# Patient Record
Sex: Female | Born: 1945
Health system: Southern US, Community
[De-identification: ages and names within clinical notes are randomized; demographics above are authoritative.]

## PROBLEM LIST (undated history)

## (undated) DIAGNOSIS — I10 Essential (primary) hypertension: Secondary | ICD-10-CM

## (undated) DIAGNOSIS — Z87442 Personal history of urinary calculi: Secondary | ICD-10-CM

## (undated) DIAGNOSIS — E785 Hyperlipidemia, unspecified: Secondary | ICD-10-CM

## (undated) DIAGNOSIS — K5790 Diverticulosis of intestine, part unspecified, without perforation or abscess without bleeding: Secondary | ICD-10-CM

## (undated) DIAGNOSIS — M1990 Unspecified osteoarthritis, unspecified site: Secondary | ICD-10-CM

## (undated) DIAGNOSIS — R51 Headache: Secondary | ICD-10-CM

## (undated) DIAGNOSIS — I499 Cardiac arrhythmia, unspecified: Secondary | ICD-10-CM

## (undated) DIAGNOSIS — G8929 Other chronic pain: Secondary | ICD-10-CM

## (undated) DIAGNOSIS — K219 Gastro-esophageal reflux disease without esophagitis: Secondary | ICD-10-CM

## (undated) DIAGNOSIS — R42 Dizziness and giddiness: Secondary | ICD-10-CM

## (undated) DIAGNOSIS — M5410 Radiculopathy, site unspecified: Secondary | ICD-10-CM

## (undated) DIAGNOSIS — M549 Dorsalgia, unspecified: Secondary | ICD-10-CM

## (undated) DIAGNOSIS — R519 Headache, unspecified: Secondary | ICD-10-CM

## (undated) DIAGNOSIS — G629 Polyneuropathy, unspecified: Secondary | ICD-10-CM

## (undated) DIAGNOSIS — R0989 Other specified symptoms and signs involving the circulatory and respiratory systems: Secondary | ICD-10-CM

## (undated) DIAGNOSIS — G43909 Migraine, unspecified, not intractable, without status migrainosus: Secondary | ICD-10-CM

## (undated) DIAGNOSIS — E119 Type 2 diabetes mellitus without complications: Secondary | ICD-10-CM

## (undated) HISTORY — PX: TUBAL LIGATION: SHX77

## (undated) HISTORY — PX: VAGINAL HYSTERECTOMY: SUR661

## (undated) HISTORY — PX: LASIK: SHX215

---

## 1997-11-16 ENCOUNTER — Other Ambulatory Visit: Admission: RE | Admit: 1997-11-16 | Discharge: 1997-11-16 | Payer: Self-pay | Admitting: Obstetrics & Gynecology

## 1997-11-20 ENCOUNTER — Ambulatory Visit (HOSPITAL_COMMUNITY): Admission: RE | Admit: 1997-11-20 | Discharge: 1997-11-20 | Payer: Self-pay | Admitting: Obstetrics and Gynecology

## 1997-11-22 ENCOUNTER — Ambulatory Visit (HOSPITAL_COMMUNITY): Admission: RE | Admit: 1997-11-22 | Discharge: 1997-11-22 | Payer: Self-pay | Admitting: Obstetrics & Gynecology

## 1998-02-20 ENCOUNTER — Other Ambulatory Visit: Admission: RE | Admit: 1998-02-20 | Discharge: 1998-02-20 | Payer: Self-pay | Admitting: Obstetrics & Gynecology

## 1998-02-25 ENCOUNTER — Encounter: Payer: Self-pay | Admitting: Neurosurgery

## 1998-02-25 ENCOUNTER — Ambulatory Visit (HOSPITAL_COMMUNITY): Admission: RE | Admit: 1998-02-25 | Discharge: 1998-02-25 | Payer: Self-pay | Admitting: Neurosurgery

## 1998-05-18 HISTORY — PX: ANTERIOR FUSION CERVICAL SPINE: SUR626

## 1998-05-19 ENCOUNTER — Emergency Department (HOSPITAL_COMMUNITY): Admission: EM | Admit: 1998-05-19 | Discharge: 1998-05-19 | Payer: Self-pay | Admitting: Emergency Medicine

## 1998-05-19 ENCOUNTER — Encounter: Payer: Self-pay | Admitting: Emergency Medicine

## 1998-05-22 ENCOUNTER — Emergency Department (HOSPITAL_COMMUNITY): Admission: EM | Admit: 1998-05-22 | Discharge: 1998-05-22 | Payer: Self-pay | Admitting: *Deleted

## 1998-07-17 ENCOUNTER — Ambulatory Visit (HOSPITAL_COMMUNITY): Admission: RE | Admit: 1998-07-17 | Discharge: 1998-07-17 | Payer: Self-pay | Admitting: Obstetrics & Gynecology

## 1998-07-17 ENCOUNTER — Encounter: Payer: Self-pay | Admitting: Obstetrics & Gynecology

## 1998-09-06 ENCOUNTER — Emergency Department (HOSPITAL_COMMUNITY): Admission: EM | Admit: 1998-09-06 | Discharge: 1998-09-06 | Payer: Self-pay | Admitting: Emergency Medicine

## 1998-09-14 ENCOUNTER — Emergency Department (HOSPITAL_COMMUNITY): Admission: EM | Admit: 1998-09-14 | Discharge: 1998-09-14 | Payer: Self-pay | Admitting: Emergency Medicine

## 1998-09-23 ENCOUNTER — Encounter: Payer: Self-pay | Admitting: Cardiology

## 1998-09-23 ENCOUNTER — Ambulatory Visit (HOSPITAL_COMMUNITY): Admission: RE | Admit: 1998-09-23 | Discharge: 1998-09-23 | Payer: Self-pay | Admitting: Cardiology

## 1999-01-03 ENCOUNTER — Emergency Department (HOSPITAL_COMMUNITY): Admission: EM | Admit: 1999-01-03 | Discharge: 1999-01-03 | Payer: Self-pay | Admitting: Emergency Medicine

## 1999-01-03 ENCOUNTER — Encounter: Payer: Self-pay | Admitting: Emergency Medicine

## 1999-02-11 ENCOUNTER — Other Ambulatory Visit: Admission: RE | Admit: 1999-02-11 | Discharge: 1999-02-11 | Payer: Self-pay | Admitting: Obstetrics & Gynecology

## 1999-03-19 ENCOUNTER — Encounter: Admission: RE | Admit: 1999-03-19 | Discharge: 1999-03-19 | Payer: Self-pay | Admitting: Neurosurgery

## 1999-03-19 ENCOUNTER — Encounter: Payer: Self-pay | Admitting: Neurosurgery

## 1999-04-22 ENCOUNTER — Inpatient Hospital Stay (HOSPITAL_COMMUNITY): Admission: RE | Admit: 1999-04-22 | Discharge: 1999-04-23 | Payer: Self-pay | Admitting: Obstetrics & Gynecology

## 1999-07-23 ENCOUNTER — Encounter: Payer: Self-pay | Admitting: Obstetrics & Gynecology

## 1999-07-23 ENCOUNTER — Ambulatory Visit (HOSPITAL_COMMUNITY): Admission: RE | Admit: 1999-07-23 | Discharge: 1999-07-23 | Payer: Self-pay | Admitting: Obstetrics & Gynecology

## 1999-07-28 ENCOUNTER — Encounter: Payer: Self-pay | Admitting: Orthopedic Surgery

## 1999-07-28 ENCOUNTER — Ambulatory Visit (HOSPITAL_COMMUNITY): Admission: RE | Admit: 1999-07-28 | Discharge: 1999-07-28 | Payer: Self-pay | Admitting: Orthopedic Surgery

## 1999-09-16 ENCOUNTER — Encounter: Payer: Self-pay | Admitting: Neurosurgery

## 1999-09-16 ENCOUNTER — Encounter: Admission: RE | Admit: 1999-09-16 | Discharge: 1999-09-16 | Payer: Self-pay | Admitting: Neurosurgery

## 1999-10-15 ENCOUNTER — Encounter: Payer: Self-pay | Admitting: Neurosurgery

## 1999-10-17 ENCOUNTER — Encounter: Payer: Self-pay | Admitting: Neurosurgery

## 1999-10-17 ENCOUNTER — Inpatient Hospital Stay (HOSPITAL_COMMUNITY): Admission: RE | Admit: 1999-10-17 | Discharge: 1999-10-24 | Payer: Self-pay | Admitting: Neurosurgery

## 2000-03-31 ENCOUNTER — Encounter: Admission: RE | Admit: 2000-03-31 | Discharge: 2000-03-31 | Payer: Self-pay | Admitting: Neurosurgery

## 2000-03-31 ENCOUNTER — Encounter: Payer: Self-pay | Admitting: Neurosurgery

## 2000-04-14 ENCOUNTER — Other Ambulatory Visit: Admission: RE | Admit: 2000-04-14 | Discharge: 2000-04-14 | Payer: Self-pay | Admitting: Obstetrics and Gynecology

## 2000-05-14 ENCOUNTER — Emergency Department (HOSPITAL_COMMUNITY): Admission: EM | Admit: 2000-05-14 | Discharge: 2000-05-14 | Payer: Self-pay | Admitting: Emergency Medicine

## 2000-07-29 ENCOUNTER — Encounter: Payer: Self-pay | Admitting: Obstetrics & Gynecology

## 2000-07-29 ENCOUNTER — Ambulatory Visit (HOSPITAL_COMMUNITY): Admission: RE | Admit: 2000-07-29 | Discharge: 2000-07-29 | Payer: Self-pay | Admitting: Obstetrics & Gynecology

## 2000-09-10 ENCOUNTER — Emergency Department (HOSPITAL_COMMUNITY): Admission: EM | Admit: 2000-09-10 | Discharge: 2000-09-10 | Payer: Self-pay | Admitting: Emergency Medicine

## 2000-09-16 ENCOUNTER — Encounter: Admission: RE | Admit: 2000-09-16 | Discharge: 2000-09-16 | Payer: Self-pay | Admitting: Cardiology

## 2000-09-16 ENCOUNTER — Encounter: Payer: Self-pay | Admitting: Cardiology

## 2000-09-30 ENCOUNTER — Encounter: Payer: Self-pay | Admitting: Cardiology

## 2000-09-30 ENCOUNTER — Encounter: Admission: RE | Admit: 2000-09-30 | Discharge: 2000-09-30 | Payer: Self-pay | Admitting: Cardiology

## 2000-10-14 ENCOUNTER — Encounter: Admission: RE | Admit: 2000-10-14 | Discharge: 2000-10-14 | Payer: Self-pay | Admitting: Neurological Surgery

## 2000-10-14 ENCOUNTER — Encounter: Payer: Self-pay | Admitting: Neurological Surgery

## 2003-10-24 ENCOUNTER — Ambulatory Visit (HOSPITAL_COMMUNITY): Admission: RE | Admit: 2003-10-24 | Discharge: 2003-10-24 | Payer: Self-pay | Admitting: Nurse Practitioner

## 2003-11-01 ENCOUNTER — Emergency Department (HOSPITAL_COMMUNITY): Admission: EM | Admit: 2003-11-01 | Discharge: 2003-11-01 | Payer: Self-pay | Admitting: Emergency Medicine

## 2003-11-09 ENCOUNTER — Emergency Department (HOSPITAL_COMMUNITY): Admission: EM | Admit: 2003-11-09 | Discharge: 2003-11-09 | Payer: Self-pay | Admitting: Emergency Medicine

## 2004-02-19 ENCOUNTER — Ambulatory Visit: Payer: Self-pay | Admitting: *Deleted

## 2004-06-03 ENCOUNTER — Emergency Department (HOSPITAL_COMMUNITY): Admission: EM | Admit: 2004-06-03 | Discharge: 2004-06-03 | Payer: Self-pay | Admitting: Emergency Medicine

## 2005-09-09 ENCOUNTER — Encounter: Admission: RE | Admit: 2005-09-09 | Discharge: 2005-09-09 | Payer: Self-pay | Admitting: Cardiology

## 2010-07-02 ENCOUNTER — Encounter (HOSPITAL_COMMUNITY): Payer: Self-pay | Admitting: Radiology

## 2010-07-02 ENCOUNTER — Emergency Department (HOSPITAL_COMMUNITY): Payer: Self-pay

## 2010-07-02 ENCOUNTER — Emergency Department (HOSPITAL_COMMUNITY)
Admission: EM | Admit: 2010-07-02 | Discharge: 2010-07-02 | Disposition: A | Discharge status: Home / Self Care | Payer: Self-pay | Attending: Emergency Medicine | Admitting: Emergency Medicine

## 2010-07-02 DIAGNOSIS — K859 Acute pancreatitis without necrosis or infection, unspecified: Secondary | ICD-10-CM | POA: Insufficient documentation

## 2010-07-02 DIAGNOSIS — R112 Nausea with vomiting, unspecified: Secondary | ICD-10-CM | POA: Insufficient documentation

## 2010-07-02 DIAGNOSIS — I10 Essential (primary) hypertension: Secondary | ICD-10-CM | POA: Insufficient documentation

## 2010-07-02 DIAGNOSIS — R1011 Right upper quadrant pain: Secondary | ICD-10-CM | POA: Insufficient documentation

## 2010-07-02 DIAGNOSIS — E119 Type 2 diabetes mellitus without complications: Secondary | ICD-10-CM | POA: Insufficient documentation

## 2010-07-02 DIAGNOSIS — R509 Fever, unspecified: Secondary | ICD-10-CM | POA: Insufficient documentation

## 2010-07-02 HISTORY — DX: Essential (primary) hypertension: I10

## 2010-07-02 LAB — DIFFERENTIAL
Basophils Absolute: 0 K/uL (ref 0.0–0.1)
Basophils Relative: 0 % (ref 0–1)
Eosinophils Absolute: 0.2 K/uL (ref 0.0–0.7)
Eosinophils Relative: 2 % (ref 0–5)
Lymphocytes Relative: 35 % (ref 12–46)
Lymphs Abs: 3.2 K/uL (ref 0.7–4.0)
Monocytes Absolute: 1.1 K/uL — ABNORMAL HIGH (ref 0.1–1.0)
Monocytes Relative: 12 % (ref 3–12)
Neutro Abs: 4.6 K/uL (ref 1.7–7.7)
Neutrophils Relative %: 51 % (ref 43–77)

## 2010-07-02 LAB — COMPREHENSIVE METABOLIC PANEL WITH GFR
ALT: 13 U/L (ref 0–35)
AST: 19 U/L (ref 0–37)
Albumin: 3.3 g/dL — ABNORMAL LOW (ref 3.5–5.2)
Alkaline Phosphatase: 55 U/L (ref 39–117)
BUN: 15 mg/dL (ref 6–23)
CO2: 24 meq/L (ref 19–32)
Calcium: 8.7 mg/dL (ref 8.4–10.5)
Chloride: 98 meq/L (ref 96–112)
Creatinine, Ser: 0.96 mg/dL (ref 0.4–1.2)
GFR calc non Af Amer: 59 mL/min — ABNORMAL LOW
Glucose, Bld: 149 mg/dL — ABNORMAL HIGH (ref 70–99)
Potassium: 3.6 meq/L (ref 3.5–5.1)
Sodium: 134 meq/L — ABNORMAL LOW (ref 135–145)
Total Bilirubin: 1 mg/dL (ref 0.3–1.2)
Total Protein: 7 g/dL (ref 6.0–8.3)

## 2010-07-02 LAB — URINALYSIS, ROUTINE W REFLEX MICROSCOPIC
Bilirubin Urine: NEGATIVE
Ketones, ur: NEGATIVE mg/dL
Leukocytes, UA: NEGATIVE
Nitrite: NEGATIVE
Protein, ur: NEGATIVE mg/dL
Specific Gravity, Urine: 1.024 (ref 1.005–1.030)
Urine Glucose, Fasting: NEGATIVE mg/dL
Urobilinogen, UA: 1 mg/dL (ref 0.0–1.0)
pH: 7 (ref 5.0–8.0)

## 2010-07-02 LAB — CBC
Hemoglobin: 13.2 g/dL (ref 12.0–15.0)
MCHC: 35.6 g/dL (ref 30.0–36.0)
Platelets: 267 10*3/uL (ref 150–400)

## 2010-07-02 LAB — URINE MICROSCOPIC-ADD ON

## 2010-07-02 LAB — LIPASE, BLOOD: Lipase: 105 U/L — ABNORMAL HIGH (ref 11–59)

## 2010-07-02 MED ORDER — IOHEXOL 300 MG/ML  SOLN: 80.0000 mL | Freq: Once | INTRAMUSCULAR | Status: DC | PRN

## 2012-09-02 ENCOUNTER — Emergency Department (HOSPITAL_COMMUNITY)
Admission: EM | Admit: 2012-09-02 | Discharge: 2012-09-02 | Disposition: A | Discharge status: Home / Self Care | Payer: Medicare Other | Attending: Emergency Medicine | Admitting: Emergency Medicine

## 2012-09-02 ENCOUNTER — Encounter (HOSPITAL_COMMUNITY): Payer: Self-pay

## 2012-09-02 DIAGNOSIS — I10 Essential (primary) hypertension: Secondary | ICD-10-CM | POA: Insufficient documentation

## 2012-09-02 DIAGNOSIS — E119 Type 2 diabetes mellitus without complications: Secondary | ICD-10-CM | POA: Insufficient documentation

## 2012-09-02 DIAGNOSIS — G43909 Migraine, unspecified, not intractable, without status migrainosus: Secondary | ICD-10-CM

## 2012-09-02 DIAGNOSIS — H53149 Visual discomfort, unspecified: Secondary | ICD-10-CM | POA: Insufficient documentation

## 2012-09-02 DIAGNOSIS — J3489 Other specified disorders of nose and nasal sinuses: Secondary | ICD-10-CM | POA: Insufficient documentation

## 2012-09-02 MED ORDER — ONDANSETRON 8 MG PO TBDP: 8.0000 mg | ORAL_TABLET | Freq: Three times a day (TID) | ORAL | Status: DC | PRN

## 2012-09-02 MED ORDER — MORPHINE SULFATE 4 MG/ML IJ SOLN
6.0000 mg | Freq: Once | INTRAMUSCULAR | Status: AC
  Administered 2012-09-02: 6 mg via INTRAVENOUS
  Filled 2012-09-02: qty 2

## 2012-09-02 MED ORDER — METOCLOPRAMIDE HCL 5 MG/ML IJ SOLN
10.0000 mg | Freq: Once | INTRAMUSCULAR | Status: AC
  Administered 2012-09-02: 10 mg via INTRAVENOUS
  Filled 2012-09-02: qty 2

## 2012-09-02 MED ORDER — SODIUM CHLORIDE 0.9 % IV BOLUS (SEPSIS)
1000.0000 mL | Freq: Once | INTRAVENOUS | Status: AC
  Administered 2012-09-02: 1000 mL via INTRAVENOUS

## 2012-09-02 MED ORDER — KETOROLAC TROMETHAMINE 30 MG/ML IJ SOLN
30.0000 mg | Freq: Once | INTRAMUSCULAR | Status: AC
  Administered 2012-09-02: 30 mg via INTRAVENOUS
  Filled 2012-09-02: qty 1

## 2012-09-02 MED ORDER — ONDANSETRON HCL 4 MG/2ML IJ SOLN
4.0000 mg | Freq: Once | INTRAMUSCULAR | Status: AC
  Administered 2012-09-02: 4 mg via INTRAVENOUS
  Filled 2012-09-02: qty 2

## 2012-09-02 MED ORDER — OXYCODONE HCL 5 MG PO TABS: 5.0000 mg | ORAL_TABLET | ORAL | Status: DC | PRN

## 2012-09-02 NOTE — ED Notes (Signed)
Pt complains of a migraine that started about 4 hours ago, pt has only taken claritin, pt presents to triage sweating and unable to hold head up

## 2012-09-02 NOTE — ED Provider Notes (Signed)
History     CSN: [id number]  Arrival date & time 09/02/12  0117   First MD Initiated Contact with Patient 09/02/12 0158      Chief Complaint  Patient presents with  . Migraine     The history is provided by the patient.   patient reports long-standing history of migraine headaches and states she's developed a new migraine headache with left-sided headache over the past 4 hours.  She has photophobia and phonophobia.  Nothing improves or worsens her symptoms.  No recent falls or trauma.  She takes no anticoagulants.  She tried Claritin at home without improvement in her symptoms.  No change in her vision.  Some nasal congestion over the past several days.  No cough or shortness of breath.  No chest pain.  Nausea without vomiting.  No diarrhea.  No abdominal pain.  No changes in her vision.  Her symptoms are mild to moderate in severity.  Nothing improves her symptoms  Past Medical History  Diagnosis Date  . Diabetes mellitus   . Hypertension     History reviewed. No pertinent past surgical history.  History reviewed. No pertinent family history.  History  Substance Use Topics  . Smoking status: Not on file  . Smokeless tobacco: Not on file  . Alcohol Use: No    OB History   Grav Para Term Preterm Abortions TAB SAB Ect Mult Living                  Review of Systems  All other systems reviewed and are negative.    Allergies  Acetaminophen  Home Medications   Current Outpatient Rx  Name  Route  Sig  Dispense  Refill  . loratadine (CLARITIN) 10 MG tablet   Oral   Take 10 mg by mouth daily as needed for allergies.         Marland Kitchen ondansetron (ZOFRAN ODT) 8 MG disintegrating tablet   Oral   Take 1 tablet (8 mg total) by mouth every 8 (eight) hours as needed for nausea.   10 tablet   0   . oxyCODONE (ROXICODONE) 5 MG immediate release tablet   Oral   Take 1 tablet (5 mg total) by mouth every 4 (four) hours as needed for pain.   8 tablet   0     BP 158/77   Pulse 60  Temp(Src) 97.7 F (36.5 C) (Oral)  Resp 24  Ht 5' 9.5" (1.765 m)  Wt 187 lb (84.823 kg)  BMI 27.23 kg/m2  SpO2 96%  Physical Exam  Nursing note and vitals reviewed. Constitutional: She is oriented to person, place, and time. She appears well-developed and well-nourished. No distress.  HENT:  Head: Normocephalic and atraumatic.  Eyes: EOM are normal. Pupils are equal, round, and reactive to light.  Neck: Normal range of motion.  Cardiovascular: Normal rate, regular rhythm and normal heart sounds.   Pulmonary/Chest: Effort normal and breath sounds normal.  Abdominal: Soft. She exhibits no distension. There is no tenderness.  Musculoskeletal: Normal range of motion.  Neurological: She is alert and oriented to person, place, and time.  5/5 strength in major muscle groups of  bilateral upper and lower extremities. Speech normal. No facial asymetry.   Skin: Skin is warm and dry.  Psychiatric: She has a normal mood and affect. Judgment normal.    ED Course  Procedures (including critical care time)  Labs Reviewed - No data to display No results found.   1. Migraine  MDM  Typical migraine headache for the pt. Non focal neuro exam. No recent head trauma. No fever. Doubt meningitis. Doubt intracranial bleed. Doubt normal pressure hydrocephalus. No indication for imaging. Will treat with migraine cocktail and reevaluate  5:34 AM Patient feels much better at this time.  Discharge home with PCP followup.  She understands to return to the ER for new or worsening symptoms        Lyanne Co, MD 09/02/12 [phone]

## 2012-09-11 ENCOUNTER — Encounter (HOSPITAL_COMMUNITY): Payer: Self-pay

## 2012-09-11 ENCOUNTER — Emergency Department (HOSPITAL_COMMUNITY)
Admission: EM | Admit: 2012-09-11 | Discharge: 2012-09-12 | Disposition: A | Discharge status: Home / Self Care | Payer: Medicare Other | Attending: Emergency Medicine | Admitting: Emergency Medicine

## 2012-09-11 DIAGNOSIS — R110 Nausea: Secondary | ICD-10-CM | POA: Insufficient documentation

## 2012-09-11 DIAGNOSIS — G43909 Migraine, unspecified, not intractable, without status migrainosus: Secondary | ICD-10-CM | POA: Insufficient documentation

## 2012-09-11 DIAGNOSIS — E119 Type 2 diabetes mellitus without complications: Secondary | ICD-10-CM | POA: Insufficient documentation

## 2012-09-11 DIAGNOSIS — I10 Essential (primary) hypertension: Secondary | ICD-10-CM | POA: Insufficient documentation

## 2012-09-11 LAB — GLUCOSE, CAPILLARY: Glucose-Capillary: 176 mg/dL — ABNORMAL HIGH (ref 70–99)

## 2012-09-11 MED ORDER — KETOROLAC TROMETHAMINE 60 MG/2ML IM SOLN
60.0000 mg | Freq: Once | INTRAMUSCULAR | Status: AC
  Administered 2012-09-12: 60 mg via INTRAMUSCULAR
  Filled 2012-09-11: qty 2

## 2012-09-11 MED ORDER — METOCLOPRAMIDE HCL 5 MG/ML IJ SOLN
10.0000 mg | Freq: Once | INTRAMUSCULAR | Status: AC
  Administered 2012-09-12: 10 mg via INTRAMUSCULAR
  Filled 2012-09-11: qty 2

## 2012-09-11 MED ORDER — DIPHENHYDRAMINE HCL 50 MG/ML IJ SOLN
25.0000 mg | Freq: Once | INTRAMUSCULAR | Status: AC
  Administered 2012-09-12: 25 mg via INTRAMUSCULAR
  Filled 2012-09-11: qty 1

## 2012-09-11 MED ORDER — ONDANSETRON 8 MG PO TBDP
8.0000 mg | ORAL_TABLET | Freq: Once | ORAL | Status: AC
  Administered 2012-09-12: 8 mg via ORAL
  Filled 2012-09-11: qty 1

## 2012-09-11 NOTE — ED Provider Notes (Signed)
History     CSN: [id number]  Arrival date & time 09/11/12  2038   First MD Initiated Contact with Patient 09/11/12 2203      Chief Complaint  Patient presents with  . Migraine    (Consider location/radiation/quality/duration/timing/severity/associated sxs/prior treatment) The history is provided by the patient and medical records. No language interpreter was used.    Kennie Karapetian is a 67 y.o. female  with a hx of migraine headache, DM, HTN presents to the Emergency Department complaining of gradual, persistent, progressively worsening headache onset 7pm tonight.  Pt took prescribed percocet and the vomited.  Pt states headache is generalized, rated  at a 10/10, throbbing in nature, nonradiating and associated with nausea and photophotophobia.   Lying still in the dark makes it better slightly and light and sound along with moving around makes it worse.  Pt denies fever, chills, neck pain, chest pain, SOB, abdominal pain, V/D, weakness, dizziness, syncope, dysuria, hematuria.  Pt denies increase in stress as of late.  Pt states this is similar to her other migraine headaches.     Past Medical History  Diagnosis Date  . Diabetes mellitus   . Hypertension     History reviewed. No pertinent past surgical history.  History reviewed. No pertinent family history.  History  Substance Use Topics  . Smoking status: Not on file  . Smokeless tobacco: Not on file  . Alcohol Use: No    OB History   Grav Para Term Preterm Abortions TAB SAB Ect Mult Living                  Review of Systems  Constitutional: Negative for fever, diaphoresis, appetite change, fatigue and unexpected weight change.  HENT: Negative for mouth sores and neck stiffness.   Eyes: Positive for photophobia. Negative for visual disturbance.  Respiratory: Negative for cough, chest tightness, shortness of breath and wheezing.   Cardiovascular: Negative for chest pain.  Gastrointestinal: Positive for nausea.  Negative for vomiting, abdominal pain, diarrhea and constipation.  Endocrine: Negative for polydipsia, polyphagia and polyuria.  Genitourinary: Negative for dysuria, urgency, frequency and hematuria.  Musculoskeletal: Negative for back pain.  Skin: Negative for rash.  Allergic/Immunologic: Negative for immunocompromised state.  Neurological: Positive for headaches. Negative for syncope and light-headedness.  Hematological: Does not bruise/bleed easily.  Psychiatric/Behavioral: Negative for sleep disturbance. The patient is not nervous/anxious.     Allergies  Acetaminophen  Home Medications   Current Outpatient Rx  Name  Route  Sig  Dispense  Refill  . loratadine (CLARITIN) 10 MG tablet   Oral   Take 10 mg by mouth daily as needed for allergies.           BP 168/100  Pulse 69  Temp(Src) 98.7 F (37.1 C) (Oral)  Resp 12  Ht 5\' 9"  (1.753 m)  Wt 190 lb (86.183 kg)  BMI 28.05 kg/m2  SpO2 98%  Physical Exam  Nursing note and vitals reviewed. Constitutional: She is oriented to person, place, and time. She appears well-developed and well-nourished. No distress.  HENT:  Head: Normocephalic and atraumatic.  Right Ear: Tympanic membrane, external ear and ear canal normal.  Left Ear: Tympanic membrane, external ear and ear canal normal.  Nose: Mucosal edema (mild) present. No rhinorrhea.  Mouth/Throat: Uvula is midline and oropharynx is clear and moist. No oropharyngeal exudate, posterior oropharyngeal edema, posterior oropharyngeal erythema or tonsillar abscesses.  Eyes: Conjunctivae and EOM are normal. Pupils are equal, round, and reactive to light. Right  eye exhibits no discharge. Left eye exhibits no discharge. No scleral icterus.  Neck: Normal range of motion and full passive range of motion without pain. Neck supple. No spinous process tenderness and no muscular tenderness present. No rigidity. Normal range of motion present.  Cardiovascular: Regular rhythm, S1 normal, S2  normal, normal heart sounds and intact distal pulses.  Bradycardia present.   Pulses:      Radial pulses are 2+ on the left side.       Dorsalis pedis pulses are 2+ on the right side, and 2+ on the left side.       Posterior tibial pulses are 2+ on the right side, and 2+ on the left side.  Pulmonary/Chest: Effort normal and breath sounds normal. No respiratory distress. She has no decreased breath sounds. She has no wheezes. She has no rhonchi. She has no rales. She exhibits no tenderness.  Abdominal: Soft. Normal appearance and bowel sounds are normal. There is no tenderness. There is no rigidity, no rebound, no guarding and no CVA tenderness.  Musculoskeletal: Normal range of motion. She exhibits no edema and no tenderness.  Lymphadenopathy:    She has no cervical adenopathy.  Neurological: She is alert and oriented to person, place, and time. She has normal reflexes. No cranial nerve deficit. She exhibits normal muscle tone. Coordination normal.  Speech is clear and goal oriented, follows commands Cranial nerves III - XII without deficit, no facial droop Normal strength in upper and lower extremities bilaterally, strong and equal grip strength Sensation normal to light and sharp touch Moves extremities without ataxia, coordination intact Normal finger to nose and rapid alternating movements Neg romberg, no pronator drift Normal gait and balance   Skin: Skin is warm and dry. No rash noted. She is not diaphoretic. No erythema.  Psychiatric: She has a normal mood and affect. Her behavior is normal. Judgment and thought content normal.    ED Course  Procedures (including critical care time)  Labs Reviewed  GLUCOSE, CAPILLARY - Abnormal; Notable for the following:    Glucose-Capillary 176 (*)    All other components within normal limits   No results found.   1. Migraines   2. Nausea       MDM  Latiqua Daloia presents with headache.  Pt HA treated and improved while in ED.   Presentation is like pts typical HA and non concerning for [facility], ICH, Meningitis, or temporal arteritis. Pt is afebrile with no focal neuro deficits, nuchal rigidity, or change in vision. Pt is to follow up with PCP to discuss prophylactic and further abortive medicaitons. Pt verbalizes understanding and is agreeable with plan to dc.         Dahlia Client Cheston Coury, PA-C 09/12/12 [phone]

## 2012-09-11 NOTE — ED Notes (Signed)
Pt complains of a migraine since this evening, was here last week for the same but is unable to take hydrocodone because it makes her sick

## 2012-09-12 ENCOUNTER — Emergency Department (HOSPITAL_COMMUNITY)
Admission: EM | Admit: 2012-09-12 | Discharge: 2012-09-13 | Discharge status: Against Medical Advice | Payer: Medicare Other | Attending: Emergency Medicine | Admitting: Emergency Medicine

## 2012-09-12 ENCOUNTER — Encounter (HOSPITAL_COMMUNITY): Payer: Self-pay | Admitting: *Deleted

## 2012-09-12 DIAGNOSIS — I10 Essential (primary) hypertension: Secondary | ICD-10-CM | POA: Insufficient documentation

## 2012-09-12 DIAGNOSIS — R51 Headache: Secondary | ICD-10-CM | POA: Insufficient documentation

## 2012-09-12 DIAGNOSIS — R110 Nausea: Secondary | ICD-10-CM | POA: Insufficient documentation

## 2012-09-12 DIAGNOSIS — E119 Type 2 diabetes mellitus without complications: Secondary | ICD-10-CM | POA: Insufficient documentation

## 2012-09-12 DIAGNOSIS — H53149 Visual discomfort, unspecified: Secondary | ICD-10-CM | POA: Insufficient documentation

## 2012-09-12 HISTORY — DX: Other specified symptoms and signs involving the circulatory and respiratory systems: R09.89

## 2012-09-12 NOTE — ED Provider Notes (Signed)
Medical screening examination/treatment/procedure(s) were performed by non-physician practitioner and as supervising physician I was immediately available for consultation/collaboration.  Carsyn Taubman K Linker, MD 09/12/12 1458 

## 2012-09-12 NOTE — ED Notes (Signed)
Pt and family reports that a migraine began around 1800; pt took Claritin and Ibuprofen with no relief; pt c/o nausea and photophobia

## 2012-09-13 NOTE — ED Notes (Signed)
Not in waiting room

## 2012-09-13 NOTE — ED Notes (Addendum)
No answer from pt in the waiting room; unable to locate pt in waiting room.

## 2012-09-14 ENCOUNTER — Other Ambulatory Visit: Payer: Self-pay | Admitting: Cardiology

## 2012-09-14 DIAGNOSIS — Z1231 Encounter for screening mammogram for malignant neoplasm of breast: Secondary | ICD-10-CM

## 2012-09-21 ENCOUNTER — Ambulatory Visit
Admission: RE | Admit: 2012-09-21 | Discharge: 2012-09-21 | Disposition: A | Discharge status: Home / Self Care | Payer: Medicare Other | Source: Ambulatory Visit | Attending: Cardiology | Admitting: Cardiology

## 2012-09-21 DIAGNOSIS — Z1231 Encounter for screening mammogram for malignant neoplasm of breast: Secondary | ICD-10-CM

## 2012-11-07 ENCOUNTER — Ambulatory Visit (HOSPITAL_COMMUNITY)
Admission: RE | Admit: 2012-11-07 | Discharge: 2012-11-07 | Disposition: A | Discharge status: Home / Self Care | Payer: Medicare Other | Source: Ambulatory Visit | Attending: Cardiology | Admitting: Cardiology

## 2012-11-07 ENCOUNTER — Other Ambulatory Visit (HOSPITAL_COMMUNITY): Payer: Self-pay | Admitting: Cardiology

## 2012-11-07 DIAGNOSIS — M1990 Unspecified osteoarthritis, unspecified site: Secondary | ICD-10-CM

## 2012-11-07 DIAGNOSIS — M898X9 Other specified disorders of bone, unspecified site: Secondary | ICD-10-CM | POA: Insufficient documentation

## 2012-11-07 DIAGNOSIS — M79609 Pain in unspecified limb: Secondary | ICD-10-CM | POA: Insufficient documentation

## 2012-11-07 DIAGNOSIS — M25469 Effusion, unspecified knee: Secondary | ICD-10-CM | POA: Insufficient documentation

## 2012-11-07 DIAGNOSIS — M25569 Pain in unspecified knee: Secondary | ICD-10-CM | POA: Insufficient documentation

## 2012-11-07 DIAGNOSIS — M899 Disorder of bone, unspecified: Secondary | ICD-10-CM | POA: Insufficient documentation

## 2013-01-11 ENCOUNTER — Other Ambulatory Visit (HOSPITAL_COMMUNITY): Payer: Self-pay | Admitting: Cardiology

## 2013-01-11 DIAGNOSIS — R52 Pain, unspecified: Secondary | ICD-10-CM

## 2013-01-18 ENCOUNTER — Other Ambulatory Visit (HOSPITAL_COMMUNITY): Payer: Self-pay | Admitting: Cardiology

## 2013-01-18 ENCOUNTER — Ambulatory Visit (HOSPITAL_COMMUNITY)
Admission: RE | Admit: 2013-01-18 | Discharge: 2013-01-18 | Disposition: A | Discharge status: Home / Self Care | Payer: Medicare Other | Source: Ambulatory Visit | Attending: Cardiology | Admitting: Cardiology

## 2013-01-18 DIAGNOSIS — M5137 Other intervertebral disc degeneration, lumbosacral region: Secondary | ICD-10-CM | POA: Insufficient documentation

## 2013-01-18 DIAGNOSIS — M545 Low back pain, unspecified: Secondary | ICD-10-CM | POA: Insufficient documentation

## 2013-01-18 DIAGNOSIS — M129 Arthropathy, unspecified: Secondary | ICD-10-CM | POA: Insufficient documentation

## 2013-01-18 DIAGNOSIS — R209 Unspecified disturbances of skin sensation: Secondary | ICD-10-CM | POA: Insufficient documentation

## 2013-01-18 DIAGNOSIS — M48061 Spinal stenosis, lumbar region without neurogenic claudication: Secondary | ICD-10-CM | POA: Insufficient documentation

## 2013-01-18 DIAGNOSIS — R52 Pain, unspecified: Secondary | ICD-10-CM

## 2013-01-18 DIAGNOSIS — M5126 Other intervertebral disc displacement, lumbar region: Secondary | ICD-10-CM | POA: Insufficient documentation

## 2013-01-18 DIAGNOSIS — M79609 Pain in unspecified limb: Secondary | ICD-10-CM | POA: Insufficient documentation

## 2013-01-18 DIAGNOSIS — M51379 Other intervertebral disc degeneration, lumbosacral region without mention of lumbar back pain or lower extremity pain: Secondary | ICD-10-CM | POA: Insufficient documentation

## 2013-01-18 DIAGNOSIS — Q762 Congenital spondylolisthesis: Secondary | ICD-10-CM | POA: Insufficient documentation

## 2013-02-01 ENCOUNTER — Other Ambulatory Visit (HOSPITAL_COMMUNITY): Payer: Self-pay | Admitting: Cardiology

## 2013-02-01 DIAGNOSIS — R0602 Shortness of breath: Secondary | ICD-10-CM

## 2013-02-06 DIAGNOSIS — M545 Low back pain, unspecified: Secondary | ICD-10-CM | POA: Insufficient documentation

## 2013-02-08 ENCOUNTER — Encounter (HOSPITAL_COMMUNITY): Payer: Medicare Other

## 2013-02-15 ENCOUNTER — Encounter (HOSPITAL_COMMUNITY): Payer: Medicare Other

## 2013-02-22 ENCOUNTER — Encounter (HOSPITAL_COMMUNITY)
Admission: RE | Admit: 2013-02-22 | Discharge: 2013-02-22 | Disposition: A | Discharge status: Home / Self Care | Payer: Medicare Other | Source: Ambulatory Visit | Attending: Cardiology | Admitting: Cardiology

## 2013-02-22 ENCOUNTER — Other Ambulatory Visit (HOSPITAL_COMMUNITY): Payer: Self-pay | Admitting: Cardiology

## 2013-02-22 ENCOUNTER — Other Ambulatory Visit: Payer: Self-pay

## 2013-02-22 ENCOUNTER — Ambulatory Visit (HOSPITAL_COMMUNITY)
Admission: RE | Admit: 2013-02-22 | Discharge: 2013-02-22 | Disposition: A | Discharge status: Home / Self Care | Payer: Medicare Other | Source: Ambulatory Visit | Attending: Cardiology | Admitting: Cardiology

## 2013-02-22 DIAGNOSIS — R0602 Shortness of breath: Secondary | ICD-10-CM | POA: Insufficient documentation

## 2013-02-22 DIAGNOSIS — I10 Essential (primary) hypertension: Secondary | ICD-10-CM | POA: Insufficient documentation

## 2013-02-22 DIAGNOSIS — R5381 Other malaise: Secondary | ICD-10-CM | POA: Insufficient documentation

## 2013-02-22 DIAGNOSIS — R52 Pain, unspecified: Secondary | ICD-10-CM

## 2013-02-22 DIAGNOSIS — E119 Type 2 diabetes mellitus without complications: Secondary | ICD-10-CM | POA: Insufficient documentation

## 2013-02-22 MED ORDER — TECHNETIUM TC 99M SESTAMIBI GENERIC - CARDIOLITE
10.0000 | Freq: Once | INTRAVENOUS | Status: AC | PRN
  Administered 2013-02-22: 10 via INTRAVENOUS

## 2013-02-22 MED ORDER — REGADENOSON 0.4 MG/5ML IV SOLN
0.4000 mg | Freq: Once | INTRAVENOUS | Status: AC
  Administered 2013-02-22: 0.4 mg via INTRAVENOUS

## 2013-02-22 MED ORDER — TECHNETIUM TC 99M SESTAMIBI GENERIC - CARDIOLITE
30.0000 | Freq: Once | INTRAVENOUS | Status: AC | PRN
  Administered 2013-02-22: 30 via INTRAVENOUS

## 2013-02-24 ENCOUNTER — Ambulatory Visit (INDEPENDENT_AMBULATORY_CARE_PROVIDER_SITE_OTHER): Payer: Medicare Other

## 2013-02-24 VITALS — BP 134/79 | HR 62 | Resp 12 | Ht 69.0 in | Wt 186.0 lb

## 2013-02-24 DIAGNOSIS — M1990 Unspecified osteoarthritis, unspecified site: Secondary | ICD-10-CM

## 2013-02-24 DIAGNOSIS — E1140 Type 2 diabetes mellitus with diabetic neuropathy, unspecified: Secondary | ICD-10-CM

## 2013-02-24 DIAGNOSIS — Q828 Other specified congenital malformations of skin: Secondary | ICD-10-CM

## 2013-02-24 DIAGNOSIS — E1149 Type 2 diabetes mellitus with other diabetic neurological complication: Secondary | ICD-10-CM

## 2013-02-24 DIAGNOSIS — E1142 Type 2 diabetes mellitus with diabetic polyneuropathy: Secondary | ICD-10-CM

## 2013-02-24 DIAGNOSIS — M79609 Pain in unspecified limb: Secondary | ICD-10-CM

## 2013-02-24 NOTE — Patient Instructions (Signed)
WARTS (Verrucae)  Warts are caused by a virus that has invaded the skin.  They are more common in young adults and children and a small percentage will resolve on their own.  There are many types of warts including mosaic warts (large flat), vulgaris (domed warts-have pearl like appearance), and plantar warts (flat or cauliflower like appearance).  Warts are highly contagious and may be picked up from any surface.  Warts thrive in a warm moist environment and are common near pools, showers, and locker room floors.  Any microscopic cut in the skin is where the virus enters and becomes a wart.  Warts are very difficult to treat and get rid of.  Patience is necessary in the treatment of this virus.  It may take months to cure and different methods may have to be used to get rid of your wart.  Standard Initial Treatment is: 1. Periodic debridement of the wart and application of Canthacur to each lesion (a blistering agent that will slough off the warty skin) 2. Dispensing of topical treatments/prescriptions to apply to the wart at home  Other options include: 1. Excision of the lesion-numbing the skin around the wart and cutting it out-requires daily soaks post-operatively and takes about 2-3 weeks to fully heal 2. Excision with CO2 Laser-Performed at the surgical center your foot is numbed up and the lesions are all cut out and then lasered with a high power laser.  Very good for multiple warts that are resistant. 3. Cimetidine (Tagamet)-Oral agent used in high does--has shown better results in children  How do I apply the standard topical treatments?  1. Salicylic Acid (Compound W wart remover liquid or gel-available at drug or grocery stores)-Apply a dime size thickness over the wart and cover with duct tape-apply at night so the medication does not spread out to the good skin.  The skin will turn white and slowly blister off.  Use a pumice stone daily to remove the white skin as best you can.  If  the skin gets too raw and painful, discontinue for a few days then resume. 2. Aldara (Imiquimod)-this is an immune response modifier.  They come in little packets so try to get at least 2 days out of each packet if you can.  Apply a small amount to the lesion and cover with duct tape.  Do not rub it in-let it absorb on its own.  Good to apply each morning.  Other Helpful Hints:  Wash shoes that can be washed in the washing machine 2-3 x per month with some bleach  Use Lysol in shoes that cannot be washed and wipe out with a cloth 1 x per week-allow to dry for 8 hours before wearing again  Use a bleach solution (1 part bleach to 3 parts water) in your tub or shower to reduce the spread of the virus to yourself and others Use aqua socks or clean sandals when at the pool or locker room to reduce the chance of picking up the virus or spreading it to others        Diabetes and Foot Care Diabetes may cause you to have a poor blood supply (circulation) to your legs and feet. Because of this, the skin may be thinner, break easier, and heal more slowly. You also may have nerve damage in your legs and feet causing decreased feeling. You may not notice minor injuries to your feet that could lead to serious problems or infections. Taking care of your feet  is one of the most important things you can do for yourself.  HOME CARE INSTRUCTIONS  Do not go barefoot. Bare feet are easily injured.  Check your feet daily for blisters, cuts, and redness.  Wash your feet with warm water (not hot) and mild soap. Pat your feet and between your toes until completely dry.  Apply a moisturizing lotion that does not contain alcohol or petroleum jelly to the dry skin on your feet and to dry brittle toenails. Do not put it between your toes.  Trim your toenails straight across. Do not dig under them or around the cuticle.  Do not cut corns or calluses, or try to remove them with medicine.  Wear clean cotton socks  or stockings every day. Make sure they are not too tight. Do not wear knee high stockings since they may decrease blood flow to your legs.  Wear leather shoes that fit properly and have enough cushioning. To break in new shoes, wear them just a few hours a day to avoid injuring your feet.  Wear shoes at all times, even in the house.  Do not cross your legs. This may decrease the blood flow to your feet.  If you find a minor scrape, cut, or break in the skin on your feet, keep it and the skin around it clean and dry. These areas may be cleansed with mild soap and water. Do not use peroxide, alcohol, iodine or Merthiolate.  When you remove an adhesive bandage, be sure not to harm the skin around it.  If you have a wound, look at it several times a day to make sure it is healing.  Do not use heating pads or hot water bottles. Burns can occur. If you have lost feeling in your feet or legs, you may not know it is happening until it is too late.  Report any cuts, sores or bruises to your caregiver. Do not wait! SEEK MEDICAL CARE IF:   You have an injury that is not healing or you notice redness, numbness, burning, or tingling.  Your feet always feel cold.  You have pain or cramps in your legs and feet. SEEK IMMEDIATE MEDICAL CARE IF:   There is increasing redness, swelling, or increasing pain in the wound.  There is a red line that goes up your leg.  Pus is coming from a wound.  You develop an unexplained oral temperature above 102 F (38.9 C), or as your caregiver suggests.  You notice a bad smell coming from an ulcer or wound. MAKE SURE YOU:   Understand these instructions.  Will watch your condition.  Will get help right away if you are not doing well or get worse. Document Released: 05/01/2000 Document Revised: 07/27/2011 Document Reviewed: 11/07/2008 [facility] Patient Information 2014 [place], [place].

## 2013-02-24 NOTE — Progress Notes (Signed)
  Subjective:    Patient ID: Angela Pugh, female    DOB: [birth year], 67 y.o.   MRN: [id number]  HPI Comments: '' THE CALLUSES UNDER MY FEET ARE BACK AND THEY HURTING''  patient has recurrence of painful keratotic lesions I tried a joints both hallux subsecond MTP area bilateral inferior right heel. Patient is in casted for diabetic shoes and custom insoles, still waiting on this. Maintaining cushioned accommodative shoe gear at all times. No changes in diabetes or other health history no changes medications.    Review of Systems  Constitutional: Negative.   HENT: Negative.   Eyes: Positive for visual disturbance.  Respiratory: Positive for shortness of breath.   Cardiovascular: Positive for leg swelling.  Gastrointestinal: Negative.   Genitourinary: Negative.   Musculoskeletal: Positive for back pain.  Skin: Negative.   Allergic/Immunologic: Negative.   Neurological: Negative.   Hematological: Negative.   Psychiatric/Behavioral: Negative.        Objective:   Physical Exam neurovascular status intact as follows DP +2/4 bilateral PT +1/4 bilateral. Skin color normal hair growth absent bilateral nails mildly criptotic bilateral. Multiple nucleated keratotic lesions plantar feet sub-hallux bilateral subsecond MTP bilateral inferior heel left. Lesions are nucleated and circumscribed with pinpoint bleeding on debridement, extremely painful during ambulation. No secondary infection no increased temperature no drainage. Epicritic sensations intact bilateral, normal plantar response and DTRs        Assessment & Plan:  Assessment diabetes with mild neuropathy. Suspect porokeratosis versus a verruca multiple lesions both feet. Palliative care provided at this time, until coming shoes are available. Maintain cushioned shoes. Lesions are debrided and dressed with Neosporin. Followup for palliative care in 2 months  Alvan Dame DPM

## 2013-03-24 ENCOUNTER — Ambulatory Visit (INDEPENDENT_AMBULATORY_CARE_PROVIDER_SITE_OTHER): Payer: Medicare Other

## 2013-03-24 VITALS — BP 127/71 | HR 69 | Resp 18

## 2013-03-24 DIAGNOSIS — Q828 Other specified congenital malformations of skin: Secondary | ICD-10-CM

## 2013-03-24 DIAGNOSIS — M1990 Unspecified osteoarthritis, unspecified site: Secondary | ICD-10-CM

## 2013-03-24 DIAGNOSIS — E1140 Type 2 diabetes mellitus with diabetic neuropathy, unspecified: Secondary | ICD-10-CM

## 2013-03-24 DIAGNOSIS — E1142 Type 2 diabetes mellitus with diabetic polyneuropathy: Secondary | ICD-10-CM

## 2013-03-24 DIAGNOSIS — M2040 Other hammer toe(s) (acquired), unspecified foot: Secondary | ICD-10-CM

## 2013-03-24 DIAGNOSIS — E1149 Type 2 diabetes mellitus with other diabetic neurological complication: Secondary | ICD-10-CM

## 2013-03-24 NOTE — Patient Instructions (Addendum)
Diabetes and Foot Care Diabetes may cause you to have problems because of poor blood supply (circulation) to your feet and legs. This may cause the skin on your feet to become thinner, break easier, and heal more slowly. Your skin may become dry, and the skin may peel and crack. You may also have nerve damage in your legs and feet causing decreased feeling in them. You may not notice minor injuries to your feet that could lead to infections or more serious problems. Taking care of your feet is one of the most important things you can do for yourself.  HOME CARE INSTRUCTIONS  Wear shoes at all times, even in the house. Do not go barefoot. Bare feet are easily injured.  Check your feet daily for blisters, cuts, and redness. If you cannot see the bottom of your feet, use a mirror or ask someone for help.  Wash your feet with warm water (do not use hot water) and mild soap. Then pat your feet and the areas between your toes until they are completely dry. Do not soak your feet as this can dry your skin.  Apply a moisturizing lotion or petroleum jelly (that does not contain alcohol and is unscented) to the skin on your feet and to dry, brittle toenails. Do not apply lotion between your toes.  Trim your toenails straight across. Do not dig under them or around the cuticle. File the edges of your nails with an emery board or nail file.  Do not cut corns or calluses or try to remove them with medicine.  Wear clean socks or stockings every day. Make sure they are not too tight. Do not wear knee-high stockings since they may decrease blood flow to your legs.  Wear shoes that fit properly and have enough cushioning. To break in new shoes, wear them for just a few hours a day. This prevents you from injuring your feet. Always look in your shoes before you put them on to be sure there are no objects inside.  Do not cross your legs. This may decrease the blood flow to your feet.  If you find a minor scrape,  cut, or break in the skin on your feet, keep it and the skin around it clean and dry. These areas may be cleansed with mild soap and water. Do not cleanse the area with peroxide, alcohol, or iodine.  When you remove an adhesive bandage, be sure not to damage the skin around it.  If you have a wound, look at it several times a day to make sure it is healing.  Do not use heating pads or hot water bottles. They may burn your skin. If you have lost feeling in your feet or legs, you may not know it is happening until it is too late.  Make sure your health care provider performs a complete foot exam at least annually or more often if you have foot problems. Report any cuts, sores, or bruises to your health care provider immediately. SEEK MEDICAL CARE IF:   You have an injury that is not healing.  You have cuts or breaks in the skin.  You have an ingrown nail.  You notice redness on your legs or feet.  You feel burning or tingling in your legs or feet.  You have pain or cramps in your legs and feet.  Your legs or feet are numb.  Your feet always feel cold. SEEK IMMEDIATE MEDICAL CARE IF:   There is increasing redness,   swelling, or pain in or around a wound.  There is a red line that goes up your leg.  Pus is coming from a wound.  You develop a fever or as directed by your health care provider.  You notice a bad smell coming from an ulcer or wound. Document Released: 05/01/2000 Document Revised: 01/04/2013 Document Reviewed: 10/11/2012 [facility] Patient Information 2014 [place], [place]. Follow instructions for break-in and use of diabetic extra-depth shoes and insoles. Contact us is any problems or difficulties develop as a result of the shoes or diabetes.

## 2013-03-24 NOTE — Progress Notes (Signed)
  Subjective:    Patient ID: Angela Pugh, female    DOB: Jul 26, 1945, 67 y.o.   MRN: [id number]  HPI Picking up my diabetic shoes no changes in medication her health history since last seen does have some keratoses sub-third left and distal clavus fourth left which is acting up she's been self debriding   Review of Systems deferred at this visit     Objective:   Physical Exam Neurovascular status is intact pedal pulses DP +2/4 PT one over 4 Refill time 3 seconds all digits. Neurologically epicritic and proprioceptive sensations diminished on Semmes Weinstein testing to the toes and forefoot normal plantar response and DTRs noted dermatologically skin color pigment normal hair growth absent nails extremely gratified friable and criptotic and presented next month for debridement. Patient also keratoses sub-hallux left sub-third left distal clavus fourth left secondary plantigrade metatarsal digital contractures. Noting changes noted no open wounds or current ulcerations noted.       Assessment & Plan:  Diabetes with peripheral neuropathy. Posture arthropathy with rigid digital deformities associated keratoses. Patient has risk factors for breakdown and ulcer formation. At this time dispensed 1 pair of extra-depth shoes 3 pairs of dual density Plastizote inlays which fit and molds directly to the patient's foot. Full contact of orthotics the foot is noted, shoe has adequate space forefoot and toes accommodating digital contractures. Patient is given oral and written instructions for shoe we use and break in period, instructed to followup in one month for debridement and palliative care. Monitor for any difficulties the shoes and contact us if any changes occur.  Alvan Dame DPM

## 2013-04-28 ENCOUNTER — Ambulatory Visit (INDEPENDENT_AMBULATORY_CARE_PROVIDER_SITE_OTHER): Payer: Medicare Other

## 2013-04-28 VITALS — BP 161/87 | HR 64 | Resp 12

## 2013-04-28 DIAGNOSIS — E1142 Type 2 diabetes mellitus with diabetic polyneuropathy: Secondary | ICD-10-CM

## 2013-04-28 DIAGNOSIS — M79609 Pain in unspecified limb: Secondary | ICD-10-CM

## 2013-04-28 DIAGNOSIS — Q828 Other specified congenital malformations of skin: Secondary | ICD-10-CM

## 2013-04-28 DIAGNOSIS — M1990 Unspecified osteoarthritis, unspecified site: Secondary | ICD-10-CM

## 2013-04-28 DIAGNOSIS — E1140 Type 2 diabetes mellitus with diabetic neuropathy, unspecified: Secondary | ICD-10-CM

## 2013-04-28 DIAGNOSIS — E1149 Type 2 diabetes mellitus with other diabetic neurological complication: Secondary | ICD-10-CM

## 2013-04-28 DIAGNOSIS — L608 Other nail disorders: Secondary | ICD-10-CM

## 2013-04-28 NOTE — Patient Instructions (Signed)
Diabetes and Foot Care Diabetes may cause you to have problems because of poor blood supply (circulation) to your feet and legs. This may cause the skin on your feet to become thinner, break easier, and heal more slowly. Your skin may become dry, and the skin may peel and crack. You may also have nerve damage in your legs and feet causing decreased feeling in them. You may not notice minor injuries to your feet that could lead to infections or more serious problems. Taking care of your feet is one of the most important things you can do for yourself.  HOME CARE INSTRUCTIONS  Wear shoes at all times, even in the house. Do not go barefoot. Bare feet are easily injured.  Check your feet daily for blisters, cuts, and redness. If you cannot see the bottom of your feet, use a mirror or ask someone for help.  Wash your feet with warm water (do not use hot water) and mild soap. Then pat your feet and the areas between your toes until they are completely dry. Do not soak your feet as this can dry your skin.  Apply a moisturizing lotion or petroleum jelly (that does not contain alcohol and is unscented) to the skin on your feet and to dry, brittle toenails. Do not apply lotion between your toes.  Trim your toenails straight across. Do not dig under them or around the cuticle. File the edges of your nails with an emery board or nail file.  Do not cut corns or calluses or try to remove them with medicine.  Wear clean socks or stockings every day. Make sure they are not too tight. Do not wear knee-high stockings since they may decrease blood flow to your legs.  Wear shoes that fit properly and have enough cushioning. To break in new shoes, wear them for just a few hours a day. This prevents you from injuring your feet. Always look in your shoes before you put them on to be sure there are no objects inside.  Do not cross your legs. This may decrease the blood flow to your feet.  If you find a minor scrape,  cut, or break in the skin on your feet, keep it and the skin around it clean and dry. These areas may be cleansed with mild soap and water. Do not cleanse the area with peroxide, alcohol, or iodine.  When you remove an adhesive bandage, be sure not to damage the skin around it.  If you have a wound, look at it several times a day to make sure it is healing.  Do not use heating pads or hot water bottles. They may burn your skin. If you have lost feeling in your feet or legs, you may not know it is happening until it is too late.  Make sure your health care provider performs a complete foot exam at least annually or more often if you have foot problems. Report any cuts, sores, or bruises to your health care provider immediately. SEEK MEDICAL CARE IF:   You have an injury that is not healing.  You have cuts or breaks in the skin.  You have an ingrown nail.  You notice redness on your legs or feet.  You feel burning or tingling in your legs or feet.  You have pain or cramps in your legs and feet.  Your legs or feet are numb.  Your feet always feel cold. SEEK IMMEDIATE MEDICAL CARE IF:   There is increasing redness,   swelling, or pain in or around a wound.  There is a red line that goes up your leg.  Pus is coming from a wound.  You develop a fever or as directed by your health care provider.  You notice a bad smell coming from an ulcer or wound. Document Released: 05/01/2000 Document Revised: 01/04/2013 Document Reviewed: 10/11/2012 [employer] Patient Information 2014 [employer].  

## 2013-04-28 NOTE — Progress Notes (Signed)
   Subjective:    Patient ID: Angela Pugh, female    DOB: November 12, 1945, 67 y.o.   MRN: [id number]  HPI Comments: '' THE CALLUSES STILL THERE AND SHOES RUBBING ON THE LITTLE TOE''     Review of Systems deferred     Objective:   Physical Exam Neurovascular status is intact with pedal pulses palpable. Refill timed 3-4 seconds all digits are lodged epicritic and proprioceptive sensations diminished on Semmes Weinstein testing to forefoot and and digits bilateral. Nails criptotic friable discolored and brittle one through 5 left 3 through 5 right. First and second ray had previously been avulsed. No secondary infections are noted skin color pigment normal hair growth absent. Multiple keratoses noted HD 5 right as well as keratoses subsecond third metatarsals bilateral to digital contracture plantar grade metatarsal. There is no open wounds or ulcerations noted also keratoses sub-first IP joint bilateral.       Assessment & Plan:  Assessment diabetes with complications as a history of neuropathy as well as mild angiopathy. Multiple dystrophic nails debrided x8 and the presence of diabetes and complications. Also debridement of multiple keratoses HD 5 right tube foam padding also dispensed also keratoses subsecond third bilateral and hallux IP joint bilateral. Return for future palliative care and as-needed basis suggest a 3 month followup  Alvan Dame DPM

## 2013-06-23 ENCOUNTER — Emergency Department (HOSPITAL_COMMUNITY)
Admission: EM | Admit: 2013-06-23 | Discharge: 2013-06-23 | Disposition: A | Discharge status: Home / Self Care | Payer: Medicare Other | Attending: Emergency Medicine | Admitting: Emergency Medicine

## 2013-06-23 ENCOUNTER — Emergency Department (HOSPITAL_COMMUNITY): Payer: Medicare Other

## 2013-06-23 ENCOUNTER — Encounter (HOSPITAL_COMMUNITY): Payer: Self-pay | Admitting: Emergency Medicine

## 2013-06-23 DIAGNOSIS — Z9889 Other specified postprocedural states: Secondary | ICD-10-CM | POA: Insufficient documentation

## 2013-06-23 DIAGNOSIS — Z8709 Personal history of other diseases of the respiratory system: Secondary | ICD-10-CM | POA: Insufficient documentation

## 2013-06-23 DIAGNOSIS — Z79899 Other long term (current) drug therapy: Secondary | ICD-10-CM | POA: Insufficient documentation

## 2013-06-23 DIAGNOSIS — M549 Dorsalgia, unspecified: Secondary | ICD-10-CM | POA: Insufficient documentation

## 2013-06-23 DIAGNOSIS — M62838 Other muscle spasm: Secondary | ICD-10-CM

## 2013-06-23 DIAGNOSIS — E119 Type 2 diabetes mellitus without complications: Secondary | ICD-10-CM | POA: Insufficient documentation

## 2013-06-23 DIAGNOSIS — M5380 Other specified dorsopathies, site unspecified: Secondary | ICD-10-CM | POA: Insufficient documentation

## 2013-06-23 DIAGNOSIS — R1032 Left lower quadrant pain: Secondary | ICD-10-CM | POA: Insufficient documentation

## 2013-06-23 DIAGNOSIS — Z87891 Personal history of nicotine dependence: Secondary | ICD-10-CM | POA: Insufficient documentation

## 2013-06-23 DIAGNOSIS — I10 Essential (primary) hypertension: Secondary | ICD-10-CM | POA: Insufficient documentation

## 2013-06-23 LAB — URINALYSIS, ROUTINE W REFLEX MICROSCOPIC
BILIRUBIN URINE: NEGATIVE
GLUCOSE, UA: NEGATIVE mg/dL
KETONES UR: NEGATIVE mg/dL
Leukocytes, UA: NEGATIVE
Nitrite: NEGATIVE
PH: 5.5 (ref 5.0–8.0)
Protein, ur: NEGATIVE mg/dL
Specific Gravity, Urine: >1.046 — ABNORMAL HIGH (ref 1.005–1.030)
Urobilinogen, UA: 0.2 mg/dL (ref 0.0–1.0)

## 2013-06-23 LAB — GLUCOSE, CAPILLARY: Glucose-Capillary: 213 mg/dL — ABNORMAL HIGH (ref 70–99)

## 2013-06-23 LAB — CBC WITH DIFFERENTIAL/PLATELET
BASOS PCT: 1 % (ref 0–1)
Basophils Absolute: 0 10*3/uL (ref 0.0–0.1)
EOS ABS: 0.1 10*3/uL (ref 0.0–0.7)
Eosinophils Relative: 2 % (ref 0–5)
HCT: 41.6 % (ref 36.0–46.0)
HEMOGLOBIN: 14.7 g/dL (ref 12.0–15.0)
LYMPHS ABS: 2.5 10*3/uL (ref 0.7–4.0)
Lymphocytes Relative: 40 % (ref 12–46)
MCH: 32.7 pg (ref 26.0–34.0)
MCHC: 35.3 g/dL (ref 30.0–36.0)
MCV: 92.7 fL (ref 78.0–100.0)
Monocytes Absolute: 0.5 10*3/uL (ref 0.1–1.0)
Monocytes Relative: 8 % (ref 3–12)
NEUTROS ABS: 3.1 10*3/uL (ref 1.7–7.7)
NEUTROS PCT: 50 % (ref 43–77)
PLATELETS: 308 10*3/uL (ref 150–400)
RBC: 4.49 MIL/uL (ref 3.87–5.11)
RDW: 12.5 % (ref 11.5–15.5)
WBC: 6.2 10*3/uL (ref 4.0–10.5)

## 2013-06-23 LAB — URINE MICROSCOPIC-ADD ON

## 2013-06-23 LAB — COMPREHENSIVE METABOLIC PANEL
ALBUMIN: 3.6 g/dL (ref 3.5–5.2)
ALK PHOS: 71 U/L (ref 39–117)
ALT: 14 U/L (ref 0–35)
AST: 15 U/L (ref 0–37)
BILIRUBIN TOTAL: 0.3 mg/dL (ref 0.3–1.2)
BUN: 12 mg/dL (ref 6–23)
CHLORIDE: 99 meq/L (ref 96–112)
CO2: 25 mEq/L (ref 19–32)
Calcium: 9.5 mg/dL (ref 8.4–10.5)
Creatinine, Ser: 0.83 mg/dL (ref 0.50–1.10)
GFR calc Af Amer: 83 mL/min — ABNORMAL LOW (ref >90)
GFR calc non Af Amer: 72 mL/min — ABNORMAL LOW (ref >90)
Glucose, Bld: 224 mg/dL — ABNORMAL HIGH (ref 70–99)
POTASSIUM: 3.3 meq/L — AB (ref 3.7–5.3)
SODIUM: 141 meq/L (ref 137–147)
TOTAL PROTEIN: 7.8 g/dL (ref 6.0–8.3)

## 2013-06-23 MED ORDER — KETOROLAC TROMETHAMINE 30 MG/ML IJ SOLN
30.0000 mg | Freq: Once | INTRAMUSCULAR | Status: AC
  Administered 2013-06-23: 30 mg via INTRAVENOUS
  Filled 2013-06-23: qty 1

## 2013-06-23 MED ORDER — IOHEXOL 300 MG/ML  SOLN
80.0000 mL | Freq: Once | INTRAMUSCULAR | Status: AC | PRN
  Administered 2013-06-23: 80 mL via INTRAVENOUS

## 2013-06-23 MED ORDER — OXYCODONE HCL 5 MG PO TABS: 5.0000 mg | ORAL_TABLET | ORAL | Status: DC | PRN

## 2013-06-23 MED ORDER — MORPHINE SULFATE 4 MG/ML IJ SOLN
4.0000 mg | Freq: Once | INTRAMUSCULAR | Status: AC
  Administered 2013-06-23: 4 mg via INTRAVENOUS
  Filled 2013-06-23: qty 1

## 2013-06-23 MED ORDER — DIAZEPAM 5 MG/ML IJ SOLN
2.5000 mg | Freq: Once | INTRAMUSCULAR | Status: AC
  Administered 2013-06-23: 2.5 mg via INTRAVENOUS
  Filled 2013-06-23: qty 2

## 2013-06-23 MED ORDER — DOCUSATE SODIUM 100 MG PO CAPS: 100.0000 mg | ORAL_CAPSULE | Freq: Two times a day (BID) | ORAL | Status: DC

## 2013-06-23 MED ORDER — SODIUM CHLORIDE 0.9 % IV BOLUS (SEPSIS)
1000.0000 mL | Freq: Once | INTRAVENOUS | Status: AC
  Administered 2013-06-23: 1000 mL via INTRAVENOUS

## 2013-06-23 MED ORDER — ONDANSETRON HCL 4 MG PO TABS: 4.0000 mg | ORAL_TABLET | Freq: Four times a day (QID) | ORAL | Status: DC

## 2013-06-23 MED ORDER — IBUPROFEN 600 MG PO TABS: 600.0000 mg | ORAL_TABLET | Freq: Four times a day (QID) | ORAL | Status: DC | PRN

## 2013-06-23 MED ORDER — IOHEXOL 300 MG/ML  SOLN: 25.0000 mL | INTRAMUSCULAR | Status: AC

## 2013-06-23 NOTE — ED Notes (Signed)
CT made aware pt finished drinking PO contrast. 

## 2013-06-23 NOTE — ED Provider Notes (Signed)
TIME SEEN: 10:22 AM  CHIEF COMPLAINT: Abdominal pain, back pain  HPI: Patient is a 68 year-old female history of hypertension, diabetes who had a routine colonoscopy on Monday, 4 days ago at [facility] who presents the emergency room with left lower quadrant pain that started on Tuesday. She also reports she's having left-sided back pain. Her pain is worse with movement and better with staying still. She has pain radiating into her left posterior thigh. She denies any numbness or tingling. No focal weakness. No bowel or bladder incontinence. She denies any fevers, chills, nausea, vomiting, dysuria or hematuria. No history of back injury. She has had one small soft bowel movement since her colonoscopy. She has had some anorexia. During the colonoscopy, patient reports he had polyps removed.  ROS: See HPI Constitutional: no fever  Eyes: no drainage  ENT: no runny nose   Cardiovascular:  no chest pain  Resp: no SOB  GI: no vomiting GU: no dysuria Integumentary: no rash  Allergy: no hives  Musculoskeletal: no leg swelling  Neurological: no slurred speech ROS otherwise negative  PAST MEDICAL HISTORY/PAST SURGICAL HISTORY:  Past Medical History  Diagnosis Date  . Diabetes mellitus   . Hypertension   . Sinus complaint     MEDICATIONS:  Prior to Admission medications   Medication Sig Start Date End Date Taking? Authorizing Provider  amLODipine (NORVASC) 10 MG tablet  02/03/13   Historical Provider, MD  amLODipine-benazepril (LOTREL) 10-20 MG per capsule Take 1 capsule by mouth daily.    Historical Provider, MD  ibuprofen (ADVIL,MOTRIN) 400 MG tablet Take 800 mg by mouth every 6 (six) hours as needed for pain (pain).    Historical Provider, MD  loratadine (CLARITIN) 10 MG tablet Take 10 mg by mouth daily as needed for allergies.    Historical Provider, MD  losartan (COZAAR) 100 MG tablet Take 100 mg by mouth daily.    Historical Provider, MD  losartan-hydrochlorothiazide Konrad Penta)  100-25 MG per tablet  04/04/13   Historical Provider, MD  mupirocin ointment (BACTROBAN) 2 %  04/24/13   Historical Provider, MD  traMADol (ULTRAM) 50 MG tablet Take 50 mg by mouth every 6 (six) hours as needed for pain.    Historical Provider, MD    ALLERGIES:  Allergies  Allergen Reactions  . Acetaminophen Hives and Nausea Only    SOCIAL HISTORY:  History  Substance Use Topics  . Smoking status: Former Research scientist (life sciences)  . Smokeless tobacco: Never Used  . Alcohol Use: No    FAMILY HISTORY: No family history on file.  EXAM: BP 170/77  Pulse 67  Temp(Src) 98.3 F (36.8 C) (Oral)  Resp 16  SpO2 98% CONSTITUTIONAL: Alert and oriented and responds appropriately to questions. Well-appearing; well-nourished HEAD: Normocephalic EYES: Conjunctivae clear, PERRL ENT: normal nose; no rhinorrhea; moist mucous membranes; pharynx without lesions noted NECK: Supple, no meningismus, no LAD  CARD: RRR; S1 and S2 appreciated; no murmurs, no clicks, no rubs, no gallops RESP: Normal chest excursion without splinting or tachypnea; breath sounds clear and equal bilaterally; no wheezes, no rhonchi, no rales,  ABD/GI: Normal bowel sounds; non-distended; soft, tender to palpation in the left lower quadrant without guarding or rebound, no peritoneal signs BACK:  The back appears normal; patient is exquisitely tender to palpation across her lower left paraspinal musculature with associated muscle spasming, no rash seen in this area, no midline spinal tenderness or step-off or deformity EXT: Normal ROM in all joints; non-tender to palpation; no edema; normal capillary refill;  no cyanosis    SKIN: Normal color for age and race; warm NEURO: Moves all extremities equally, sensation to light touch intact diffusely PSYCH: The patient's mood and manner are appropriate. Grooming and personal hygiene are appropriate.  MEDICAL DECISION MAKING: Patient here with what seems to be left lumbar paraspinal muscular spasm  versus strain. She denies any history of injury. She is neurologically intact. She is exquisitely tender to very light palpation of this area. She is also however having some left lower quadrant abdominal pain and is status post colonoscopy. Will obtain abdominal labs and start with abdominal x-ray. I feel patient will likely need a CT scan of her abdomen and she is unable to differentiate her pain it is a poor historian. We'll give pain medication, IV fluids.  ED PROGRESS: Patient's labs are unremarkable. CT scan shows no sign of perforated bowel, abscess, diverticulitis. Urine pending. Patient still unable to ambulate secondary to her back pain. We'll give Toradol, Valium and reassess.   2:02 PM  Pt pain is is completely resolved after Valium and Toradol. Her urine does show a small amount of blood but no other sign of infection. She denies a prior history of kidney stones and has had blood in her urine once before in 2012 at that time head CT imaging that did not show a kidney stone either. Suspect patient's pain is either due to kidney stones versus muscle spasm. Will discharge her with pain medication, urine strainer and return precautions. Instructed patient to continue using MiraLax, Colace as she has a moderate stillborn also seen on x-ray. Patient verbalizes understanding is comfortable with this plan.  [place], DO 06/23/13 [phone]

## 2013-06-23 NOTE — ED Notes (Signed)
Dr. Ward at the bedside.  

## 2013-06-23 NOTE — ED Notes (Signed)
Daughter's number [phone]

## 2013-06-23 NOTE — ED Notes (Signed)
While Dr. Leonides Schanz examining pt, she has more severe pain in left lower back with barely palpating the skin and muscles.

## 2013-06-23 NOTE — ED Notes (Signed)
Notified RN of CBG 213

## 2013-06-23 NOTE — Discharge Instructions (Signed)
Back Pain, Adult Low back pain is very common. About 1 in 5 people have back pain.The cause of low back pain is rarely dangerous. The pain often gets better over time.About half of people with a sudden onset of back pain feel better in just 2 weeks. About 8 in 10 people feel better by 6 weeks.  CAUSES Some common causes of back pain include:  Strain of the muscles or ligaments supporting the spine.  Wear and tear (degeneration) of the spinal discs.  Arthritis.  Direct injury to the back. DIAGNOSIS Most of the time, the direct cause of low back pain is not known.However, back pain can be treated effectively even when the exact cause of the pain is unknown.Answering your caregiver's questions about your overall health and symptoms is one of the most accurate ways to make sure the cause of your pain is not dangerous. If your caregiver needs more information, he or she may order lab work or imaging tests (X-rays or MRIs).However, even if imaging tests show changes in your back, this usually does not require surgery. HOME CARE INSTRUCTIONS For many people, back pain returns.Since low back pain is rarely dangerous, it is often a condition that people can learn to [facility] their own.   Remain active. It is stressful on the back to sit or stand in one place. Do not sit, drive, or stand in one place for more than 30 minutes at a time. Take short walks on level surfaces as soon as pain allows.Try to increase the length of time you walk each day.  Do not stay in bed.Resting more than 1 or 2 days can delay your recovery.  Do not avoid exercise or work.Your body is made to move.It is not dangerous to be active, even though your back may hurt.Your back will likely heal faster if you return to being active before your pain is gone.  Pay attention to your body when you bend and lift. Many people have less discomfortwhen lifting if they bend their knees, keep the load close to their bodies,and  avoid twisting. Often, the most comfortable positions are those that put less stress on your recovering back.  Find a comfortable position to sleep. Use a firm mattress and lie on your side with your knees slightly bent. If you lie on your back, put a pillow under your knees.  Only take over-the-counter or prescription medicines as directed by your caregiver. Over-the-counter medicines to reduce pain and inflammation are often the most helpful.Your caregiver may prescribe muscle relaxant drugs.These medicines help dull your pain so you can more quickly return to your normal activities and healthy exercise.  Put ice on the injured area.  Put ice in a plastic bag.  Place a towel between your skin and the bag.  Leave the ice on for 15-20 minutes, 03-04 times a day for the first 2 to 3 days. After that, ice and heat may be alternated to reduce pain and spasms.  Ask your caregiver about trying back exercises and gentle massage. This may be of some benefit.  Avoid feeling anxious or stressed.Stress increases muscle tension and can worsen back pain.It is important to recognize when you are anxious or stressed and learn ways to manage it.Exercise is a great option. SEEK MEDICAL CARE IF:  You have pain that is not relieved with rest or medicine.  You have pain that does not improve in 1 week.  You have new symptoms.  You are generally not feeling well. SEEK  IMMEDIATE MEDICAL CARE IF:   You have pain that radiates from your back into your legs.  You develop new bowel or bladder control problems.  You have unusual weakness or numbness in your arms or legs.  You develop nausea or vomiting.  You develop abdominal pain.  You feel faint. Document Released: 05/04/2005 Document Revised: 11/03/2011 Document Reviewed: 09/22/2010 [facility] Patient Information 2014 [place], [place].  Lumbosacral Strain Lumbosacral strain is a strain of any of the parts that make up your lumbosacral  vertebrae. Your lumbosacral vertebrae are the bones that make up the lower third of your backbone. Your lumbosacral vertebrae are held together by muscles and tough, fibrous tissue (ligaments).  CAUSES  A sudden blow to your back can cause lumbosacral strain. Also, anything that causes an excessive stretch of the muscles in the low back can cause this strain. This is typically seen when people exert themselves strenuously, fall, lift heavy objects, bend, or crouch repeatedly. RISK FACTORS  Physically demanding work.  Participation in pushing or pulling sports or sports that require sudden twist of the back (tennis, golf, baseball).  Weight lifting.  Excessive lower back curvature.  Forward-tilted pelvis.  Weak back or abdominal muscles or both.  Tight hamstrings. SIGNS AND SYMPTOMS  Lumbosacral strain may cause pain in the area of your injury or pain that moves (radiates) down your leg.  DIAGNOSIS Your health care provider can often diagnose lumbosacral strain through a physical exam. In some cases, you may need tests such as X-ray exams.  TREATMENT  Treatment for your lower back injury depends on many factors that your clinician will have to evaluate. However, most treatment will include the use of anti-inflammatory medicines. HOME CARE INSTRUCTIONS   Avoid hard physical activities (tennis, racquetball, waterskiing) if you are not in proper physical condition for it. This may aggravate or create problems.  If you have a back problem, avoid sports requiring sudden body movements. Swimming and walking are generally safer activities.  Maintain good posture.  Maintain a healthy weight.  For acute conditions, you may put ice on the injured area.  Put ice in a plastic bag.  Place a towel between your skin and the bag.  Leave the ice on for 20 minutes, 2 3 times a day.  When the low back starts healing, stretching and strengthening exercises may be recommended. SEEK MEDICAL CARE  IF:  Your back pain is getting worse.  You experience severe back pain not relieved with medicines. SEEK IMMEDIATE MEDICAL CARE IF:   You have numbness, tingling, weakness, or problems with the use of your arms or legs.  There is a change in bowel or bladder control.  You have increasing pain in any area of the body, including your belly (abdomen).  You notice shortness of breath, dizziness, or feel faint.  You feel sick to your stomach (nauseous), are throwing up (vomiting), or become sweaty.  You notice discoloration of your toes or legs, or your feet get very cold. MAKE SURE YOU:   Understand these instructions.  Will watch your condition.  Will get help right away if you are not doing well or get worse. Document Released: 02/11/2005 Document Revised: 02/22/2013 Document Reviewed: 12/21/2012 [facility] Patient Information 2014 [place], [place].  Heat Therapy Heat therapy can help make painful, stiff muscles and joints feel better. Do not use heat on new injuries. Wait at least 48 hours after an injury to use heat. Do not use heat when you have aches or pains right after  an activity. If you still have pain 3 hours after stopping the activity, then you may use heat. HOME CARE Wet heat pack  Soak a clean towel in warm water. Squeeze out the extra water.  Put the warm, wet towel in a plastic bag.  Place a thin, dry towel between your skin and the bag.  Put the heat pack on the area for 5 minutes, and check your skin. Your skin may be pink, but it should not be red.  Leave the heat pack on the area for 15 to 30 minutes.  Repeat this every 2 to 4 hours while awake. Do not use heat while you are sleeping. Warm water bath  Fill a tub with warm water.  Place the affected body part in the tub.  Soak the area for 20 to 40 minutes.  Repeat as needed. Hot water bottle  Fill the water bottle half full with hot water.  Press out the extra air. Close the cap tightly.  Place  a dry towel between your skin and the bottle.  Put the bottle on the area for 5 minutes, and check your skin. Your skin may be pink, but it should not be red.  Leave the bottle on the area for 15 to 30 minutes.  Repeat this every 2 to 4 hours while awake. Electric heating pad  Place a dry towel between your skin and the heating pad.  Set the heating pad on low heat.  Put the heating pad on the area for 10 minutes, and check your skin. Your skin may be pink, but it should not be red.  Leave the heating pad on the area for 20 to 40 minutes.  Repeat this every 2 to 4 hours while awake.  Do not lie on the heating pad.  Do not fall asleep while using the heating pad.  Do not use the heating pad near water. GET HELP RIGHT AWAY IF:  You get blisters or red skin.  Your skin is puffy (swollen), or you lose feeling (numbness) in the affected area.  You have any new problems.  Your problems are getting worse.  You have any questions or concerns. If you have any problems, stop using heat therapy until you see your doctor. MAKE SURE YOU:  Understand these instructions.  Will watch your condition.  Will get help right away if you are not doing well or get worse. Document Released: 07/27/2011 Document Reviewed: 07/27/2011 [facility] Patient Information 2014 [place]. POSSIBLE Kidney Stones Kidney stones (urolithiasis) are deposits that form inside your kidneys. The intense pain is caused by the stone moving through the urinary tract. When the stone moves, the ureter goes into spasm around the stone. The stone is usually passed in the urine.  CAUSES   A disorder that makes certain neck glands produce too much parathyroid hormone (primary hyperparathyroidism).  A buildup of uric acid crystals, similar to gout in your joints.  Narrowing (stricture) of the ureter.  A kidney obstruction present at birth (congenital obstruction).  Previous surgery on the kidney or  ureters.  Numerous kidney infections. SYMPTOMS   Feeling sick to your stomach (nauseous).  Throwing up (vomiting).  Blood in the urine (hematuria).  Pain that usually spreads (radiates) to the groin.  Frequency or urgency of urination. DIAGNOSIS   Taking a history and physical exam.  Blood or urine tests.  CT scan.  Occasionally, an examination of the inside of the urinary bladder (cystoscopy) is performed. TREATMENT  Observation.  Increasing your fluid intake.  Extracorporeal shock wave lithotripsy This is a noninvasive procedure that uses shock waves to break up kidney stones.  Surgery may be needed if you have severe pain or persistent obstruction. There are various surgical procedures. Most of the procedures are performed with the use of small instruments. Only small incisions are needed to accommodate these instruments, so recovery time is minimized. The size, location, and chemical composition are all important variables that will determine the proper choice of action for you. Talk to your health care provider to better understand your situation so that you will minimize the risk of injury to yourself and your kidney.  HOME CARE INSTRUCTIONS   Drink enough water and fluids to keep your urine clear or pale yellow. This will help you to pass the stone or stone fragments.  Strain all urine through the provided strainer. Keep all particulate matter and stones for your health care provider to see. The stone causing the pain may be as small as a grain of salt. It is very important to use the strainer each and every time you pass your urine. The collection of your stone will allow your health care provider to analyze it and verify that a stone has actually passed. The stone analysis will often identify what you can do to reduce the incidence of recurrences.  Only take over-the-counter or prescription medicines for pain, discomfort, or fever as directed by your health care  provider.  Make a follow-up appointment with your health care provider as directed.  Get follow-up X-rays if required. The absence of pain does not always mean that the stone has passed. It may have only stopped moving. If the urine remains completely obstructed, it can cause loss of kidney function or even complete destruction of the kidney. It is your responsibility to make sure X-rays and follow-ups are completed. Ultrasounds of the kidney can show blockages and the status of the kidney. Ultrasounds are not associated with any radiation and can be performed easily in a matter of minutes. SEEK MEDICAL CARE IF:  You experience pain that is progressive and unresponsive to any pain medicine you have been prescribed. SEEK IMMEDIATE MEDICAL CARE IF:   Pain cannot be controlled with the prescribed medicine.  You have a fever or shaking chills.  The severity or intensity of pain increases over 18 hours and is not relieved by pain medicine.  You develop a new onset of abdominal pain.  You feel faint or pass out.  You are unable to urinate. MAKE SURE YOU:   Understand these instructions.  Will watch your condition.  Will get help right away if you are not doing well or get worse. Document Released: 05/04/2005 Document Revised: 01/04/2013 Document Reviewed: 10/05/2012 [facility] Patient Information 2014 [place].

## 2013-06-23 NOTE — ED Notes (Signed)
Pt had colonoscopy on Monday which they removed a couple of polyps. LLQ pain since Tuesday. Has not been eating a lot and has only had 1 little "squirt" of stool. Denies any nausea or vomiting with the pain. Denies any urinary symptoms.

## 2013-06-29 ENCOUNTER — Encounter (HOSPITAL_COMMUNITY): Payer: Self-pay | Admitting: Emergency Medicine

## 2013-06-29 ENCOUNTER — Emergency Department (HOSPITAL_COMMUNITY): Payer: Medicare Other

## 2013-06-29 ENCOUNTER — Emergency Department (HOSPITAL_COMMUNITY)
Admission: EM | Admit: 2013-06-29 | Discharge: 2013-06-29 | Disposition: A | Discharge status: Home / Self Care | Payer: Medicare Other | Attending: Emergency Medicine | Admitting: Emergency Medicine

## 2013-06-29 DIAGNOSIS — M5432 Sciatica, left side: Secondary | ICD-10-CM

## 2013-06-29 DIAGNOSIS — I10 Essential (primary) hypertension: Secondary | ICD-10-CM | POA: Insufficient documentation

## 2013-06-29 DIAGNOSIS — M549 Dorsalgia, unspecified: Secondary | ICD-10-CM

## 2013-06-29 DIAGNOSIS — Z79899 Other long term (current) drug therapy: Secondary | ICD-10-CM | POA: Insufficient documentation

## 2013-06-29 DIAGNOSIS — E119 Type 2 diabetes mellitus without complications: Secondary | ICD-10-CM | POA: Insufficient documentation

## 2013-06-29 DIAGNOSIS — Z87891 Personal history of nicotine dependence: Secondary | ICD-10-CM | POA: Insufficient documentation

## 2013-06-29 DIAGNOSIS — M5430 Sciatica, unspecified side: Secondary | ICD-10-CM | POA: Insufficient documentation

## 2013-06-29 MED ORDER — METHOCARBAMOL 100 MG/ML IJ SOLN
1000.0000 mg | Freq: Once | INTRAVENOUS | Status: AC
  Administered 2013-06-29: 1000 mg via INTRAVENOUS
  Filled 2013-06-29: qty 10

## 2013-06-29 MED ORDER — KETOROLAC TROMETHAMINE 30 MG/ML IJ SOLN
30.0000 mg | Freq: Once | INTRAMUSCULAR | Status: AC
Start: 2013-06-29 — End: 2013-06-29
  Administered 2013-06-29: 30 mg via INTRAVENOUS
  Filled 2013-06-29: qty 1

## 2013-06-29 MED ORDER — METHOCARBAMOL 100 MG/ML IJ SOLN: 1000.0000 mg | Freq: Once | INTRAMUSCULAR | Status: DC

## 2013-06-29 MED ORDER — HYDROMORPHONE HCL PF 1 MG/ML IJ SOLN
0.5000 mg | Freq: Once | INTRAMUSCULAR | Status: AC
  Administered 2013-06-29: 0.5 mg via INTRAVENOUS
  Filled 2013-06-29: qty 1

## 2013-06-29 MED ORDER — HYDROMORPHONE HCL PF 1 MG/ML IJ SOLN
1.0000 mg | Freq: Once | INTRAMUSCULAR | Status: AC
  Administered 2013-06-29: 1 mg via INTRAVENOUS
  Filled 2013-06-29: qty 1

## 2013-06-29 MED ORDER — ONDANSETRON HCL 4 MG/2ML IJ SOLN
4.0000 mg | Freq: Once | INTRAMUSCULAR | Status: AC
  Administered 2013-06-29: 4 mg via INTRAVENOUS
  Filled 2013-06-29: qty 2

## 2013-06-29 MED ORDER — LORAZEPAM 2 MG/ML IJ SOLN
1.0000 mg | Freq: Once | INTRAMUSCULAR | Status: AC
  Administered 2013-06-29: 1 mg via INTRAVENOUS
  Filled 2013-06-29: qty 1

## 2013-06-29 NOTE — ED Notes (Signed)
Family at bedside. 

## 2013-06-29 NOTE — ED Notes (Signed)
Pt c/o left hip pain that radiates into left leg, pt recd 150mg  Fentanyl in route via EMS, Pts Pain level has decreased from a 10/10 to 6/10

## 2013-06-29 NOTE — ED Notes (Signed)
Pt c/o left hip pain that radiates into her outer leg and around her knee, Pt was seen on the 6th for lower back pain

## 2013-06-29 NOTE — ED Provider Notes (Signed)
CSN: [id number]     Arrival date & time 06/29/13  3614 History   First MD Initiated Contact with Patient 06/29/13 0534     Chief Complaint  Patient presents with  . Leg Pain     (Consider location/radiation/quality/duration/timing/severity/associated sxs/prior Treatment) Patient is a 68 y.o. female presenting with leg pain. The history is provided by the patient.  Leg Pain She is complaining of pain in her left thigh for the last 9 days. Pain is sharp and severe and worse with any movement or with standing. She rates pain at 10/10. There is no numbness or tingling or weakness -- he denies bowel or bladder dysfunction. She was seen in the ED and had a CT of her abdomen and pelvis because of concerns of complication from a colonoscopy. At home, she has been taking oxycodone and ibuprofen with little relief. She did see an orthopedic specialist and is scheduled to see a back specialist tomorrow. She denies any trauma but does have a history of back problems.  Past Medical History  Diagnosis Date  . Diabetes mellitus   . Hypertension   . Sinus complaint    Past Surgical History  Procedure Laterality Date  . Abdominal hysterectomy    . Bone spurs      removed   No family history on file. History  Substance Use Topics  . Smoking status: Former Research scientist (life sciences)  . Smokeless tobacco: Never Used  . Alcohol Use: No   OB History   Grav Para Term Preterm Abortions TAB SAB Ect Mult Living                 Review of Systems  All other systems reviewed and are negative.      Allergies  Acetaminophen  Home Medications   Current Outpatient Rx  Name  Route  Sig  Dispense  Refill  . amLODipine (NORVASC) 5 MG tablet   Oral   Take 5 mg by mouth 2 (two) times daily.         . Aspirin-Acetaminophen-Caffeine (GOODY HEADACHE PO)   Oral   Take 1 packet by mouth 2 (two) times daily as needed (headache).         . colesevelam (WELCHOL) 625 MG tablet   Oral   Take 1,875 mg by mouth daily.          Marland Kitchen docusate sodium (COLACE) 100 MG capsule   Oral   Take 1 capsule (100 mg total) by mouth every 12 (twelve) hours.   60 capsule   0   . ibuprofen (ADVIL,MOTRIN) 400 MG tablet   Oral   Take 800 mg by mouth every 6 (six) hours as needed for pain (pain).         Marland Kitchen ibuprofen (ADVIL,MOTRIN) 600 MG tablet   Oral   Take 1 tablet (600 mg total) by mouth every 6 (six) hours as needed.   30 tablet   0   . loratadine (CLARITIN) 10 MG tablet   Oral   Take 10 mg by mouth daily as needed for allergies.         Marland Kitchen losartan-hydrochlorothiazide (HYZAAR) 100-25 MG per tablet   Oral   Take 1 tablet by mouth daily.          . ondansetron (ZOFRAN) 4 MG tablet   Oral   Take 1 tablet (4 mg total) by mouth every 6 (six) hours.   12 tablet   0   . oxyCODONE (OXY IR/ROXICODONE) 5 MG immediate release  tablet   Oral   Take 1 tablet (5 mg total) by mouth every 4 (four) hours as needed for severe pain.   20 tablet   0   . traMADol (ULTRAM) 50 MG tablet   Oral   Take 50 mg by mouth every 6 (six) hours as needed for moderate pain.           BP 155/81  Pulse 55  Temp(Src) 98.4 F (36.9 C) (Oral)  Resp 14  Ht 5\' 9"  (1.753 m)  Wt 186 lb (84.369 kg)  BMI 27.45 kg/m2  SpO2 100% Physical Exam  Nursing note and vitals reviewed.  68 year old female, resting comfortably and in no acute distress. Vital signs are significant for bradycardia with heart rate 55, and hypertension with blood pressure 155/81. Oxygen saturation is 100%, which is normal. Head is normocephalic and atraumatic. PERRLA, EOMI. Oropharynx is clear. Neck is nontender and supple without adenopathy or JVD. Back is moderately tender in the lumbar area with bilateral paralumbar spasm which is significantly greater on the left than on the right. Straight leg raise is positive on the left at 30. Lungs are clear without rales, wheezes, or rhonchi. Chest is nontender. Heart has regular rate and rhythm without  murmur. Abdomen is soft, flat, nontender without masses or hepatosplenomegaly and peristalsis is normoactive. Extremities have no cyanosis or edema, full range of motion is present. There is no tenderness to palpation. Distal neurovascular exam is intact with strong pulses, prompt capillary refill, and normal sensation. Skin is warm and dry without rash. Neurologic: Mental status is normal, cranial nerves are intact, there are no objective motor deficits. She states there is slight decreased pin prick sensation rather diffusely throughout her left leg but not in a dermatome pattern. Deep tendon reflexes are symmetric.  ED Course  Procedures (including critical care time)  MDM   Final diagnoses:  Left sided sciatica    Left-sided sciatica. Old records are reviewed and the focus of her ED visit last week was possible complication from colonoscopy. She did have an MRI of her lumbar spine which was incomplete but showed degenerative changes with facet arthropathy and anterolisthesis of L4 on L5 with possible encroachment on L4 nerve roots, but this was worse on the right than on the left. Will attempt to get MRI today. In the meantime, she is given intravenous ketorolac, hydromorphone, ondansetron, and methocarbamol. Patient states that she is claustrophobic for MRI scan, so she is also given a dose of lorazepam.  MRI is in progress. Case is signed out to Dr. Curly Rim to evaluate results of MRI and arrange disposition.  Delora Fuel, MD 33/00/76 [id number]

## 2013-06-29 NOTE — Discharge Instructions (Signed)
Sciatica °Sciatica is pain, weakness, numbness, or tingling along the path of the sciatic nerve. The nerve starts in the lower back and runs down the back of each leg. The nerve controls the muscles in the lower leg and in the back of the knee, while also providing sensation to the back of the thigh, lower leg, and the sole of your foot. Sciatica is a symptom of another medical condition. For instance, nerve damage or certain conditions, such as a herniated disk or bone spur on the spine, pinch or put pressure on the sciatic nerve. This causes the pain, weakness, or other sensations normally associated with sciatica. Generally, sciatica only affects one side of the body. °CAUSES  °· Herniated or slipped disc. °· Degenerative disk disease. °· A pain disorder involving the narrow muscle in the buttocks (piriformis syndrome). °· Pelvic injury or fracture. °· Pregnancy. °· Tumor (rare). °SYMPTOMS  °Symptoms can vary from mild to very severe. The symptoms usually travel from the low back to the buttocks and down the back of the leg. Symptoms can include: °· Mild tingling or dull aches in the lower back, leg, or hip. °· Numbness in the back of the calf or sole of the foot. °· Burning sensations in the lower back, leg, or hip. °· Sharp pains in the lower back, leg, or hip. °· Leg weakness. °· Severe back pain inhibiting movement. °These symptoms may get worse with coughing, sneezing, laughing, or prolonged sitting or standing. Also, being overweight may worsen symptoms. °DIAGNOSIS  °Your caregiver will perform a physical exam to look for common symptoms of sciatica. He or she may ask you to do certain movements or activities that would trigger sciatic nerve pain. Other tests may be performed to find the cause of the sciatica. These may include: °· Blood tests. °· X-rays. °· Imaging tests, such as an MRI or CT scan. °TREATMENT  °Treatment is directed at the cause of the sciatic pain. Sometimes, treatment is not necessary  and the pain and discomfort goes away on its own. If treatment is needed, your caregiver may suggest: °· Over-the-counter medicines to relieve pain. °· Prescription medicines, such as anti-inflammatory medicine, muscle relaxants, or narcotics. °· Applying heat or ice to the painful area. °· Steroid injections to lessen pain, irritation, and inflammation around the nerve. °· Reducing activity during periods of pain. °· Exercising and stretching to strengthen your abdomen and improve flexibility of your spine. Your caregiver may suggest losing weight if the extra weight makes the back pain worse. °· Physical therapy. °· Surgery to eliminate what is pressing or pinching the nerve, such as a bone spur or part of a herniated disk. °HOME CARE INSTRUCTIONS  °· Only take over-the-counter or prescription medicines for pain or discomfort as directed by your caregiver. °· Apply ice to the affected area for 20 minutes, 3 4 times a day for the first 48 72 hours. Then try heat in the same way. °· Exercise, stretch, or perform your usual activities if these do not aggravate your pain. °· Attend physical therapy sessions as directed by your caregiver. °· Keep all follow-up appointments as directed by your caregiver. °· Do not wear high heels or shoes that do not provide proper support. °· Check your mattress to see if it is too soft. A firm mattress may lessen your pain and discomfort. °SEEK IMMEDIATE MEDICAL CARE IF:  °· You lose control of your bowel or bladder (incontinence). °· You have increasing weakness in the lower back,   pelvis, buttocks, or legs.  You have redness or swelling of your back.  You have a burning sensation when you urinate.  You have pain that gets worse when you lie down or awakens you at night.  Your pain is worse than you have experienced in the past.  Your pain is lasting longer than 4 weeks.  You are suddenly losing weight without reason. MAKE SURE YOU:  Understand these  instructions.  Will watch your condition.  Will get help right away if you are not doing well or get worse. Document Released: 04/28/2001 Document Revised: 11/03/2011 Document Reviewed: 09/13/2011 [facility] Patient Information 2014 [place].  Take medications as prescribed. Keep your appointment with the spine doctor that you have for tomorrow.

## 2013-06-29 NOTE — ED Provider Notes (Signed)
Patient taken internal from Dr. Roxanne Mins pending MRI.  MRI reveals increased left foraminal and far lateral disc herniation at L3-L4 compressing the L3 nerve root. Additionally there is unchanged advanced facet degenerative changes at L4-L5.  Lengthy discussion with patient regarding the need to followup with the spine surgeon tomorrow. She has an existing appointment.  10:35 AM Patient requesting more IV pain medication prior to discharge. Will give 1 additional dose of pain meds and then discharge patient. Pt is on multiple medications for her pain - norco, ibuprofen, robaxin, prednisone, oxycodone.  No new outpatient Rx at this time.  Patient agrees with plan.  ER precautions were given.    Elmer Sow, MD 06/29/13 [phone]

## 2013-06-30 ENCOUNTER — Observation Stay (HOSPITAL_COMMUNITY)
Admission: AD | Admit: 2013-06-30 | Discharge: 2013-07-01 | Disposition: A | Discharge status: Home / Self Care | Payer: Medicare Other | Source: Ambulatory Visit | Attending: Specialist | Admitting: Specialist

## 2013-06-30 ENCOUNTER — Encounter (HOSPITAL_COMMUNITY)
Admission: AD | Disposition: A | Discharge status: Home / Self Care | Payer: Self-pay | Source: Ambulatory Visit | Attending: Specialist

## 2013-06-30 ENCOUNTER — Ambulatory Visit (HOSPITAL_COMMUNITY): Payer: Medicare Other | Admitting: Anesthesiology

## 2013-06-30 ENCOUNTER — Ambulatory Visit (HOSPITAL_COMMUNITY): Payer: Medicare Other

## 2013-06-30 ENCOUNTER — Encounter (HOSPITAL_COMMUNITY): Payer: Medicare Other | Admitting: Anesthesiology

## 2013-06-30 ENCOUNTER — Encounter (HOSPITAL_COMMUNITY): Payer: Self-pay | Admitting: Emergency Medicine

## 2013-06-30 ENCOUNTER — Encounter (HOSPITAL_COMMUNITY): Payer: Self-pay

## 2013-06-30 ENCOUNTER — Emergency Department (HOSPITAL_COMMUNITY)
Admission: EM | Admit: 2013-06-30 | Discharge: 2013-06-30 | Disposition: A | Discharge status: Home / Self Care | Payer: Medicare Other | Attending: Emergency Medicine | Admitting: Emergency Medicine

## 2013-06-30 DIAGNOSIS — M5432 Sciatica, left side: Secondary | ICD-10-CM

## 2013-06-30 DIAGNOSIS — Z79899 Other long term (current) drug therapy: Secondary | ICD-10-CM | POA: Insufficient documentation

## 2013-06-30 DIAGNOSIS — M19019 Primary osteoarthritis, unspecified shoulder: Secondary | ICD-10-CM | POA: Insufficient documentation

## 2013-06-30 DIAGNOSIS — Z87891 Personal history of nicotine dependence: Secondary | ICD-10-CM | POA: Insufficient documentation

## 2013-06-30 DIAGNOSIS — I10 Essential (primary) hypertension: Secondary | ICD-10-CM | POA: Insufficient documentation

## 2013-06-30 DIAGNOSIS — M545 Low back pain, unspecified: Secondary | ICD-10-CM | POA: Insufficient documentation

## 2013-06-30 DIAGNOSIS — E119 Type 2 diabetes mellitus without complications: Secondary | ICD-10-CM | POA: Insufficient documentation

## 2013-06-30 DIAGNOSIS — M5126 Other intervertebral disc displacement, lumbar region: Principal | ICD-10-CM | POA: Insufficient documentation

## 2013-06-30 DIAGNOSIS — M5430 Sciatica, unspecified side: Secondary | ICD-10-CM | POA: Insufficient documentation

## 2013-06-30 DIAGNOSIS — M1610 Unilateral primary osteoarthritis, unspecified hip: Secondary | ICD-10-CM | POA: Insufficient documentation

## 2013-06-30 DIAGNOSIS — R51 Headache: Secondary | ICD-10-CM | POA: Insufficient documentation

## 2013-06-30 DIAGNOSIS — M129 Arthropathy, unspecified: Secondary | ICD-10-CM | POA: Insufficient documentation

## 2013-06-30 DIAGNOSIS — M549 Dorsalgia, unspecified: Secondary | ICD-10-CM

## 2013-06-30 DIAGNOSIS — IMO0002 Reserved for concepts with insufficient information to code with codable children: Secondary | ICD-10-CM

## 2013-06-30 DIAGNOSIS — M1710 Unilateral primary osteoarthritis, unspecified knee: Secondary | ICD-10-CM | POA: Insufficient documentation

## 2013-06-30 DIAGNOSIS — M19049 Primary osteoarthritis, unspecified hand: Secondary | ICD-10-CM | POA: Insufficient documentation

## 2013-06-30 HISTORY — PX: LUMBAR LAMINECTOMY: SHX95

## 2013-06-30 HISTORY — DX: Unspecified osteoarthritis, unspecified site: M19.90

## 2013-06-30 LAB — GLUCOSE, CAPILLARY
Glucose-Capillary: 152 mg/dL — ABNORMAL HIGH (ref 70–99)
Glucose-Capillary: 123 mg/dL — ABNORMAL HIGH (ref 70–99)
Glucose-Capillary: 101 mg/dL — ABNORMAL HIGH (ref 70–99)
Glucose-Capillary: 112 mg/dL — ABNORMAL HIGH (ref 70–99)
Glucose-Capillary: 140 mg/dL — ABNORMAL HIGH (ref 70–99)

## 2013-06-30 SURGERY — MICRODISCECTOMY LUMBAR LAMINECTOMY
Anesthesia: General | Site: Back | Laterality: Left

## 2013-06-30 MED ORDER — METHOCARBAMOL 500 MG PO TABS
500.0000 mg | ORAL_TABLET | Freq: Four times a day (QID) | ORAL | Status: DC | PRN
Start: 2013-06-30 — End: 2013-07-01
  Administered 2013-06-30: 500 mg via ORAL
  Filled 2013-06-30: qty 1

## 2013-06-30 MED ORDER — ROCURONIUM BROMIDE 100 MG/10ML IV SOLN
INTRAVENOUS | Status: DC | PRN
  Administered 2013-06-30: 50 mg via INTRAVENOUS

## 2013-06-30 MED ORDER — HYDROMORPHONE HCL PF 1 MG/ML IJ SOLN
0.2500 mg | INTRAMUSCULAR | Status: DC | PRN
  Administered 2013-06-30: 0.25 mg via INTRAVENOUS
  Administered 2013-06-30: 0.5 mg via INTRAVENOUS
  Administered 2013-06-30: 0.25 mg via INTRAVENOUS

## 2013-06-30 MED ORDER — PHENYLEPHRINE HCL 10 MG/ML IJ SOLN
INTRAMUSCULAR | Status: DC | PRN
  Administered 2013-06-30 (×2): 40 ug via INTRAVENOUS

## 2013-06-30 MED ORDER — LOSARTAN POTASSIUM-HCTZ 100-25 MG PO TABS: 1.0000 | ORAL_TABLET | Freq: Every day | ORAL | Status: DC

## 2013-06-30 MED ORDER — ARTIFICIAL TEARS OP OINT
TOPICAL_OINTMENT | OPHTHALMIC | Status: DC | PRN
  Administered 2013-06-30: 1 via OPHTHALMIC

## 2013-06-30 MED ORDER — DOCUSATE SODIUM 100 MG PO CAPS
100.0000 mg | ORAL_CAPSULE | Freq: Two times a day (BID) | ORAL | Status: DC
  Administered 2013-07-01: 100 mg via ORAL
  Filled 2013-06-30 (×3): qty 1

## 2013-06-30 MED ORDER — SODIUM CHLORIDE 0.9 % IV SOLN
INTRAVENOUS | Status: DC
  Administered 2013-06-30: 23:00:00 via INTRAVENOUS

## 2013-06-30 MED ORDER — LIDOCAINE HCL (CARDIAC) 20 MG/ML IV SOLN
INTRAVENOUS | Status: DC | PRN
  Administered 2013-06-30: 100 mg via INTRAVENOUS

## 2013-06-30 MED ORDER — OXYCODONE HCL 5 MG PO TABS
5.0000 mg | ORAL_TABLET | ORAL | Status: DC | PRN
  Administered 2013-06-30 – 2013-07-01 (×3): 5 mg via ORAL
  Filled 2013-06-30 (×3): qty 1

## 2013-06-30 MED ORDER — HYDROMORPHONE HCL PF 1 MG/ML IJ SOLN
1.0000 mg | Freq: Once | INTRAMUSCULAR | Status: AC
  Administered 2013-06-30: 1 mg via INTRAVENOUS
  Filled 2013-06-30: qty 1

## 2013-06-30 MED ORDER — PHENOL 1.4 % MT LIQD: 1.0000 | OROMUCOSAL | Status: DC | PRN

## 2013-06-30 MED ORDER — MUPIROCIN 2 % EX OINT
TOPICAL_OINTMENT | Freq: Two times a day (BID) | CUTANEOUS | Status: DC
  Administered 2013-06-30 (×2): via NASAL
  Filled 2013-06-30 (×2): qty 22

## 2013-06-30 MED ORDER — POTASSIUM CHLORIDE IN NACL 20-0.45 MEQ/L-% IV SOLN: INTRAVENOUS | Status: DC

## 2013-06-30 MED ORDER — THROMBIN 20000 UNITS EX SOLR
CUTANEOUS | Status: AC
  Filled 2013-06-30: qty 20000

## 2013-06-30 MED ORDER — HYDROMORPHONE HCL PF 1 MG/ML IJ SOLN
INTRAMUSCULAR | Status: AC
  Filled 2013-06-30: qty 1

## 2013-06-30 MED ORDER — HYDROCHLOROTHIAZIDE 25 MG PO TABS
25.0000 mg | ORAL_TABLET | Freq: Every day | ORAL | Status: DC
  Administered 2013-07-01: 25 mg via ORAL
  Filled 2013-06-30 (×2): qty 1

## 2013-06-30 MED ORDER — MIDAZOLAM HCL 2 MG/2ML IJ SOLN: 1.0000 mg | INTRAMUSCULAR | Status: DC | PRN

## 2013-06-30 MED ORDER — ONDANSETRON HCL 4 MG/2ML IJ SOLN
4.0000 mg | Freq: Four times a day (QID) | INTRAMUSCULAR | Status: DC | PRN
  Administered 2013-06-30: 4 mg via INTRAVENOUS

## 2013-06-30 MED ORDER — MIDAZOLAM HCL 5 MG/5ML IJ SOLN
INTRAMUSCULAR | Status: DC | PRN
  Administered 2013-06-30: 2 mg via INTRAVENOUS

## 2013-06-30 MED ORDER — ONDANSETRON HCL 4 MG/2ML IJ SOLN
INTRAMUSCULAR | Status: AC
  Filled 2013-06-30: qty 2

## 2013-06-30 MED ORDER — ROCURONIUM BROMIDE 50 MG/5ML IV SOLN
INTRAVENOUS | Status: AC
  Filled 2013-06-30: qty 1

## 2013-06-30 MED ORDER — KETOROLAC TROMETHAMINE 30 MG/ML IJ SOLN
30.0000 mg | Freq: Four times a day (QID) | INTRAMUSCULAR | Status: DC
  Administered 2013-07-01 (×2): 30 mg via INTRAVENOUS
  Filled 2013-06-30 (×6): qty 1

## 2013-06-30 MED ORDER — PREDNISONE 10 MG PO TABS
10.0000 mg | ORAL_TABLET | Freq: Two times a day (BID) | ORAL | Status: DC
  Filled 2013-06-30 (×2): qty 1

## 2013-06-30 MED ORDER — PREDNISONE 10 MG PO TABS
10.0000 mg | ORAL_TABLET | Freq: Once | ORAL | Status: DC
  Filled 2013-06-30: qty 1

## 2013-06-30 MED ORDER — AMLODIPINE BESYLATE 5 MG PO TABS
5.0000 mg | ORAL_TABLET | Freq: Two times a day (BID) | ORAL | Status: DC
  Administered 2013-06-30 – 2013-07-01 (×2): 5 mg via ORAL
  Filled 2013-06-30 (×4): qty 1

## 2013-06-30 MED ORDER — IBUPROFEN 600 MG PO TABS
600.0000 mg | ORAL_TABLET | Freq: Four times a day (QID) | ORAL | Status: DC
  Administered 2013-06-30 – 2013-07-01 (×2): 600 mg via ORAL
  Filled 2013-06-30 (×7): qty 1

## 2013-06-30 MED ORDER — PANTOPRAZOLE SODIUM 40 MG IV SOLR
40.0000 mg | Freq: Every day | INTRAVENOUS | Status: DC
  Administered 2013-06-30: 40 mg via INTRAVENOUS
  Filled 2013-06-30 (×3): qty 40

## 2013-06-30 MED ORDER — BUPIVACAINE LIPOSOME 1.3 % IJ SUSP
20.0000 mL | Freq: Once | INTRAMUSCULAR | Status: AC
  Administered 2013-06-30: 10 mL
  Filled 2013-06-30: qty 20

## 2013-06-30 MED ORDER — BUPIVACAINE-EPINEPHRINE (PF) 0.5% -1:200000 IJ SOLN
INTRAMUSCULAR | Status: AC
  Filled 2013-06-30: qty 10

## 2013-06-30 MED ORDER — MENTHOL 3 MG MT LOZG
1.0000 | LOZENGE | OROMUCOSAL | Status: DC | PRN
Start: 2013-06-30 — End: 2013-07-01
  Administered 2013-06-30: 3 mg via ORAL
  Filled 2013-06-30: qty 9

## 2013-06-30 MED ORDER — PREDNISONE 5 MG PO TABS
5.0000 mg | ORAL_TABLET | Freq: Three times a day (TID) | ORAL | Status: DC
  Filled 2013-06-30: qty 1

## 2013-06-30 MED ORDER — HYDROCODONE-ACETAMINOPHEN 5-325 MG PO TABS: 1.0000 | ORAL_TABLET | ORAL | Status: DC | PRN

## 2013-06-30 MED ORDER — LOSARTAN POTASSIUM 50 MG PO TABS
100.0000 mg | ORAL_TABLET | Freq: Every day | ORAL | Status: DC
  Administered 2013-07-01: 100 mg via ORAL
  Filled 2013-06-30 (×2): qty 2

## 2013-06-30 MED ORDER — ONDANSETRON HCL 4 MG/2ML IJ SOLN
4.0000 mg | Freq: Once | INTRAMUSCULAR | Status: AC
  Administered 2013-06-30: 4 mg via INTRAVENOUS
  Filled 2013-06-30: qty 2

## 2013-06-30 MED ORDER — MIDAZOLAM HCL 2 MG/2ML IJ SOLN
INTRAMUSCULAR | Status: AC
  Filled 2013-06-30: qty 2

## 2013-06-30 MED ORDER — FENTANYL CITRATE 0.05 MG/ML IJ SOLN: 50.0000 ug | Freq: Once | INTRAMUSCULAR | Status: DC

## 2013-06-30 MED ORDER — SODIUM CHLORIDE 0.9 % IJ SOLN
3.0000 mL | INTRAMUSCULAR | Status: DC | PRN
Start: 2013-06-30 — End: 2013-07-01

## 2013-06-30 MED ORDER — PREDNISONE (PAK) 5 MG PO TABS: 5.0000 mg | ORAL_TABLET | ORAL | Status: DC

## 2013-06-30 MED ORDER — ONDANSETRON HCL 4 MG/2ML IJ SOLN
INTRAMUSCULAR | Status: DC | PRN
  Administered 2013-06-30: 4 mg via INTRAVENOUS

## 2013-06-30 MED ORDER — SODIUM CHLORIDE 0.9 % IJ SOLN: 3.0000 mL | Freq: Two times a day (BID) | INTRAMUSCULAR | Status: DC

## 2013-06-30 MED ORDER — PROMETHAZINE HCL 25 MG/ML IJ SOLN: 6.2500 mg | INTRAMUSCULAR | Status: DC | PRN

## 2013-06-30 MED ORDER — SODIUM CHLORIDE 0.9 % IV SOLN: 250.0000 mL | INTRAVENOUS | Status: DC

## 2013-06-30 MED ORDER — LACTATED RINGERS IV SOLN
INTRAVENOUS | Status: DC
  Administered 2013-06-30: 13:00:00 via INTRAVENOUS

## 2013-06-30 MED ORDER — NEOSTIGMINE METHYLSULFATE 1 MG/ML IJ SOLN
INTRAMUSCULAR | Status: AC
  Filled 2013-06-30: qty 10

## 2013-06-30 MED ORDER — CEFAZOLIN SODIUM-DEXTROSE 2-3 GM-% IV SOLR: 2.0000 g | INTRAVENOUS | Status: DC

## 2013-06-30 MED ORDER — LACTATED RINGERS IV SOLN
INTRAVENOUS | Status: DC | PRN
  Administered 2013-06-30 (×2): via INTRAVENOUS

## 2013-06-30 MED ORDER — ALUM & MAG HYDROXIDE-SIMETH 200-200-20 MG/5ML PO SUSP
30.0000 mL | Freq: Four times a day (QID) | ORAL | Status: DC | PRN
Start: 2013-06-30 — End: 2013-07-01

## 2013-06-30 MED ORDER — COLESEVELAM HCL 625 MG PO TABS
1875.0000 mg | ORAL_TABLET | Freq: Every day | ORAL | Status: DC
  Administered 2013-07-01: 1875 mg via ORAL
  Filled 2013-06-30 (×2): qty 3

## 2013-06-30 MED ORDER — ONDANSETRON HCL 4 MG/2ML IJ SOLN
4.0000 mg | INTRAMUSCULAR | Status: DC | PRN
  Administered 2013-07-01: 4 mg via INTRAVENOUS
  Filled 2013-06-30: qty 2

## 2013-06-30 MED ORDER — CHLORHEXIDINE GLUCONATE 4 % EX LIQD
60.0000 mL | Freq: Once | CUTANEOUS | Status: DC
  Filled 2013-06-30: qty 60

## 2013-06-30 MED ORDER — NEOSTIGMINE METHYLSULFATE 1 MG/ML IJ SOLN
INTRAMUSCULAR | Status: DC | PRN
  Administered 2013-06-30: 4 mg via INTRAVENOUS

## 2013-06-30 MED ORDER — ONDANSETRON HCL 4 MG/2ML IJ SOLN
INTRAMUSCULAR | Status: AC
  Administered 2013-06-30: 4 mg via INTRAVENOUS
  Filled 2013-06-30: qty 2

## 2013-06-30 MED ORDER — FENTANYL CITRATE 0.05 MG/ML IJ SOLN
INTRAMUSCULAR | Status: DC | PRN
  Administered 2013-06-30: 50 ug via INTRAVENOUS
  Administered 2013-06-30: 100 ug via INTRAVENOUS
  Administered 2013-06-30 (×2): 50 ug via INTRAVENOUS

## 2013-06-30 MED ORDER — SODIUM CHLORIDE 0.9 % IV SOLN
INTRAVENOUS | Status: DC
  Administered 2013-06-30: 02:00:00 via INTRAVENOUS

## 2013-06-30 MED ORDER — PROPOFOL 10 MG/ML IV BOLUS
INTRAVENOUS | Status: AC
  Filled 2013-06-30: qty 20

## 2013-06-30 MED ORDER — PREDNISONE 10 MG PO TABS
10.0000 mg | ORAL_TABLET | Freq: Every day | ORAL | Status: DC
  Administered 2013-07-01: 10 mg via ORAL
  Filled 2013-06-30 (×2): qty 1

## 2013-06-30 MED ORDER — BUPIVACAINE-EPINEPHRINE 0.5% -1:200000 IJ SOLN
INTRAMUSCULAR | Status: DC | PRN
  Administered 2013-06-30: 5 mL

## 2013-06-30 MED ORDER — HYDROMORPHONE HCL PF 1 MG/ML IJ SOLN: 0.5000 mg | INTRAMUSCULAR | Status: DC | PRN

## 2013-06-30 MED ORDER — GLYCOPYRROLATE 0.2 MG/ML IJ SOLN
INTRAMUSCULAR | Status: AC
  Filled 2013-06-30: qty 3

## 2013-06-30 MED ORDER — GLYCOPYRROLATE 0.2 MG/ML IJ SOLN
INTRAMUSCULAR | Status: DC | PRN
  Administered 2013-06-30 (×2): 0.2 mg via INTRAVENOUS
  Administered 2013-06-30: .8 mg via INTRAVENOUS

## 2013-06-30 MED ORDER — 0.9 % SODIUM CHLORIDE (POUR BTL) OPTIME
TOPICAL | Status: DC | PRN
  Administered 2013-06-30: 1000 mL

## 2013-06-30 MED ORDER — FENTANYL CITRATE 0.05 MG/ML IJ SOLN
INTRAMUSCULAR | Status: AC
  Filled 2013-06-30: qty 5

## 2013-06-30 MED ORDER — PHENYLEPHRINE 40 MCG/ML (10ML) SYRINGE FOR IV PUSH (FOR BLOOD PRESSURE SUPPORT)
PREFILLED_SYRINGE | INTRAVENOUS | Status: AC
  Filled 2013-06-30: qty 10

## 2013-06-30 MED ORDER — METHOCARBAMOL 500 MG PO TABS
500.0000 mg | ORAL_TABLET | Freq: Two times a day (BID) | ORAL | Status: DC
  Administered 2013-07-01: 500 mg via ORAL
  Filled 2013-06-30 (×3): qty 1

## 2013-06-30 MED ORDER — ARTIFICIAL TEARS OP OINT
TOPICAL_OINTMENT | OPHTHALMIC | Status: AC
  Filled 2013-06-30: qty 3.5

## 2013-06-30 MED ORDER — CEFAZOLIN SODIUM 1-5 GM-% IV SOLN
1.0000 g | Freq: Three times a day (TID) | INTRAVENOUS | Status: AC
  Administered 2013-07-01: 1 g via INTRAVENOUS
  Filled 2013-06-30 (×2): qty 50

## 2013-06-30 MED ORDER — MUPIROCIN 2 % EX OINT
TOPICAL_OINTMENT | CUTANEOUS | Status: AC
  Filled 2013-06-30: qty 22

## 2013-06-30 MED ORDER — POLYETHYLENE GLYCOL 3350 17 G PO PACK
17.0000 g | PACK | Freq: Every day | ORAL | Status: DC | PRN
Start: 2013-06-30 — End: 2013-07-01

## 2013-06-30 MED ORDER — HYDROMORPHONE HCL PF 1 MG/ML IJ SOLN
0.5000 mg | INTRAMUSCULAR | Status: DC | PRN
  Administered 2013-06-30: 0.5 mg via INTRAVENOUS

## 2013-06-30 MED ORDER — PROPOFOL 10 MG/ML IV BOLUS
INTRAVENOUS | Status: DC | PRN
  Administered 2013-06-30: 200 mg via INTRAVENOUS

## 2013-06-30 MED ORDER — OXYCODONE HCL 5 MG PO TABS: 5.0000 mg | ORAL_TABLET | Freq: Once | ORAL | Status: DC | PRN

## 2013-06-30 MED ORDER — METHOCARBAMOL 500 MG PO TABS: 500.0000 mg | ORAL_TABLET | Freq: Three times a day (TID) | ORAL | Status: DC | PRN

## 2013-06-30 MED ORDER — ONDANSETRON HCL 4 MG PO TABS
4.0000 mg | ORAL_TABLET | Freq: Four times a day (QID) | ORAL | Status: DC
  Administered 2013-07-01: 4 mg via ORAL
  Filled 2013-06-30 (×5): qty 1

## 2013-06-30 MED ORDER — OXYCODONE HCL 5 MG/5ML PO SOLN: 5.0000 mg | Freq: Once | ORAL | Status: DC | PRN

## 2013-06-30 MED ORDER — METHOCARBAMOL 100 MG/ML IJ SOLN
500.0000 mg | Freq: Four times a day (QID) | INTRAVENOUS | Status: DC | PRN
  Filled 2013-06-30: qty 5

## 2013-06-30 MED ORDER — PREDNISONE 10 MG PO TABS: 10.0000 mg | ORAL_TABLET | Freq: Two times a day (BID) | ORAL | Status: DC

## 2013-06-30 SURGICAL SUPPLY — 55 items
BUR RND FLUTED 2.5 (BURR) IMPLANT
BUR ROUND FLUTED 4 SOFT TCH (BURR) ×2 IMPLANT
BUR ROUND FLUTED 4MM SOFT TCH (BURR) ×1
BUR SABER RD CUTTING 3.0 (BURR) IMPLANT
BUR SABER RD CUTTING 3.0MM (BURR)
CANISTER SUCTION 2500CC (MISCELLANEOUS) ×3 IMPLANT
CLOTH BEACON ORANGE TIMEOUT ST (SAFETY) ×3 IMPLANT
CORDS BIPOLAR (ELECTRODE) ×3 IMPLANT
COVER MAYO STAND STRL (DRAPES) ×3 IMPLANT
COVER SURGICAL LIGHT HANDLE (MISCELLANEOUS) ×3 IMPLANT
DERMABOND ADVANCED (GAUZE/BANDAGES/DRESSINGS) ×2
DERMABOND ADVANCED .7 DNX12 (GAUZE/BANDAGES/DRESSINGS) ×1 IMPLANT
DRAPE C-ARM 42X72 X-RAY (DRAPES) ×3 IMPLANT
DRAPE MICROSCOPE LEICA (MISCELLANEOUS) ×3 IMPLANT
DRAPE POUCH INSTRU U-SHP 10X18 (DRAPES) ×3 IMPLANT
DRAPE PROXIMA HALF (DRAPES) ×3 IMPLANT
DRAPE SURG 17X23 STRL (DRAPES) ×12 IMPLANT
DRSG MEPILEX BORDER 4X4 (GAUZE/BANDAGES/DRESSINGS) ×3 IMPLANT
DRSG MEPILEX BORDER 4X8 (GAUZE/BANDAGES/DRESSINGS) IMPLANT
DURAPREP 26ML APPLICATOR (WOUND CARE) ×3 IMPLANT
ELECT BLADE 4.0 EZ CLEAN MEGAD (MISCELLANEOUS) ×3
ELECT CAUTERY BLADE 6.4 (BLADE) ×3 IMPLANT
ELECT REM PT RETURN 9FT ADLT (ELECTROSURGICAL) ×3
ELECTRODE BLDE 4.0 EZ CLN MEGD (MISCELLANEOUS) ×1 IMPLANT
ELECTRODE REM PT RTRN 9FT ADLT (ELECTROSURGICAL) ×1 IMPLANT
GLOVE BIOGEL PI IND STRL 7.5 (GLOVE) ×1 IMPLANT
GLOVE BIOGEL PI INDICATOR 7.5 (GLOVE) ×2
GLOVE ECLIPSE 7.0 STRL STRAW (GLOVE) ×3 IMPLANT
GLOVE ECLIPSE 8.5 STRL (GLOVE) ×3 IMPLANT
GLOVE SURG 8.5 LATEX PF (GLOVE) ×3 IMPLANT
GOWN PREVENTION PLUS LG XLONG (DISPOSABLE) IMPLANT
GOWN STRL NON-REIN LRG LVL3 (GOWN DISPOSABLE) ×6 IMPLANT
GOWN STRL REUS W/TWL 2XL LVL3 (GOWN DISPOSABLE) ×3 IMPLANT
KIT BASIN OR (CUSTOM PROCEDURE TRAY) ×3 IMPLANT
KIT ROOM TURNOVER OR (KITS) ×3 IMPLANT
NEEDLE 22X1 1/2 (OR ONLY) (NEEDLE) ×3 IMPLANT
NEEDLE SPNL 18GX3.5 QUINCKE PK (NEEDLE) ×6 IMPLANT
NS IRRIG 1000ML POUR BTL (IV SOLUTION) ×3 IMPLANT
PACK LAMINECTOMY ORTHO (CUSTOM PROCEDURE TRAY) ×3 IMPLANT
PAD ARMBOARD 7.5X6 YLW CONV (MISCELLANEOUS) ×6 IMPLANT
PATTIES SURGICAL .5 X.5 (GAUZE/BANDAGES/DRESSINGS) IMPLANT
PATTIES SURGICAL .75X.75 (GAUZE/BANDAGES/DRESSINGS) IMPLANT
SPONGE LAP 4X18 X RAY DECT (DISPOSABLE) IMPLANT
SPONGE SURGIFOAM ABS GEL 100 (HEMOSTASIS) IMPLANT
SUT VIC AB 1 CT1 27 (SUTURE) ×2
SUT VIC AB 1 CT1 27XBRD ANBCTR (SUTURE) ×1 IMPLANT
SUT VIC AB 2-0 CT1 27 (SUTURE) ×2
SUT VIC AB 2-0 CT1 TAPERPNT 27 (SUTURE) ×1 IMPLANT
SUT VICRYL 0 UR6 27IN ABS (SUTURE) ×3 IMPLANT
SUT VICRYL 4-0 PS2 18IN ABS (SUTURE) ×3 IMPLANT
SYR CONTROL 10ML LL (SYRINGE) ×3 IMPLANT
TOWEL OR 17X24 6PK STRL BLUE (TOWEL DISPOSABLE) ×3 IMPLANT
TOWEL OR 17X26 10 PK STRL BLUE (TOWEL DISPOSABLE) ×3 IMPLANT
TRAY FOLEY CATH 16FRSI W/METER (SET/KITS/TRAYS/PACK) IMPLANT
WATER STERILE IRR 1000ML POUR (IV SOLUTION) ×3 IMPLANT

## 2013-06-30 NOTE — Anesthesia Procedure Notes (Signed)
Procedure Name: Intubation Date/Time: 06/30/2013 3:58 PM Performed by: Scheryl Darter Pre-anesthesia Checklist: Patient identified, Emergency Drugs available, Suction available, Patient being monitored and Timeout performed Patient Re-evaluated:Patient Re-evaluated prior to inductionOxygen Delivery Method: Circle system utilized Preoxygenation: Pre-oxygenation with 100% oxygen Intubation Type: IV induction Ventilation: Mask ventilation without difficulty Laryngoscope Size: Miller and 2 Grade View: Grade II Tube type: Oral Tube size: 7.5 mm Number of attempts: 1 Airway Equipment and Method: Stylet Placement Confirmation: ETT inserted through vocal cords under direct vision,  positive ETCO2 and breath sounds checked- equal and bilateral Secured at: 22 cm Tube secured with: Tape Dental Injury: Teeth and Oropharynx as per pre-operative assessment

## 2013-06-30 NOTE — ED Notes (Signed)
Pt helped out to sons car in wheel chair.

## 2013-06-30 NOTE — H&P (Signed)
Angela Pugh is an 68 y.o. female.   Chief Complaint: left leg pain and back pain HPI: Pt with new onset of severe back pain and intractable left leg pain since Tuesday.  She has pain in the left hip and thigh which is worse with walking.  Was placed on steroid dose pak and no relief.  Has been to the ED for treatment.  Radiographs with spondylolisthesis L4-5 with biforaminal narrowing right >left.   New MRI was performed and when compared to last MRI new findings of far lateral recess HNP left L3-4 affecting the Left L3 nerve root.  Pt now unable to walk and pain is so severe she has been unable to care for herself.  No BM for several days and no appetite secondary to pain. She was brought to the office after being in [facility] ED last pm and leaving at 5 am.  She is unable to sit or stand or walk and can lie on the exam table with her knees flexed.  She is assisted by her family. Pt being placed on the schedule for microdiscectomy at left L3-4 through a far lateral incision for excision of HNP.  Risk, benefits and other options of treatment explained to the pt and her family by Dr Louanne Skye.  They wish to proceed.  Past Medical History  Diagnosis Date  . Hypertension   . Sinus complaint   . Diabetes mellitus     diet controlled, no meds now, was on insulin & po treatment at one time    . Headache(784.0)     occas. relative to sinuses   . Arthritis     back, knees, shoulder, hands , injections in pelvis, for bone spurs    Past Surgical History  Procedure Laterality Date  . Abdominal hysterectomy    . Bone spurs      removed  . Anterior fusion cervical spine  2000    History reviewed. No pertinent family history. Social History:  reports that she has quit smoking. She quit smokeless tobacco use about 31 years ago. She reports that she drinks alcohol. She reports that she does not use illicit drugs.  Allergies:  Allergies  Allergen Reactions  . Acetaminophen Hives and Nausea Only     Medications Prior to Admission  Medication Sig Dispense Refill  . colesevelam (WELCHOL) 625 MG tablet Take 1,875 mg by mouth daily.      Marland Kitchen docusate sodium (COLACE) 100 MG capsule Take 1 capsule (100 mg total) by mouth every 12 (twelve) hours.  60 capsule  0  . HYDROcodone-acetaminophen (NORCO/VICODIN) 5-325 MG per tablet Take 1 tablet by mouth every 6 (six) hours as needed for moderate pain.      Marland Kitchen ibuprofen (ADVIL,MOTRIN) 600 MG tablet Take 1 tablet (600 mg total) by mouth every 6 (six) hours as needed.  30 tablet  0  . methocarbamol (ROBAXIN) 500 MG tablet Take 500 mg by mouth 2 (two) times daily.      . ondansetron (ZOFRAN) 4 MG tablet Take 1 tablet (4 mg total) by mouth every 6 (six) hours.  12 tablet  0  . predniSONE (STERAPRED UNI-PAK) 5 MG TABS tablet Take 5 mg by mouth as directed. Take as directed.      Marland Kitchen amLODipine (NORVASC) 5 MG tablet Take 5 mg by mouth 2 (two) times daily.      Marland Kitchen losartan-hydrochlorothiazide (HYZAAR) 100-25 MG per tablet Take 1 tablet by mouth daily.       Marland Kitchen oxyCODONE (  OXY IR/ROXICODONE) 5 MG immediate release tablet Take 1 tablet (5 mg total) by mouth every 4 (four) hours as needed for severe pain.  20 tablet  0    Results for orders placed during the hospital encounter of 06/30/13 (from the past 48 hour(s))  GLUCOSE, CAPILLARY     Status: Abnormal   Collection Time    06/30/13 12:29 PM      Result Value Ref Range   Glucose-Capillary 152 (*) 70 - 99 mg/dL  GLUCOSE, CAPILLARY     Status: Abnormal   Collection Time    06/30/13  2:53 PM      Result Value Ref Range   Glucose-Capillary 123 (*) 70 - 99 mg/dL   Mr Lumbar Spine Wo Contrast  06/29/2013   CLINICAL DATA:  Sciatic it with radiation to the left.  EXAM: MRI LUMBAR SPINE WITHOUT CONTRAST  TECHNIQUE: Multiplanar, multisequence MR imaging was performed. No intravenous contrast was administered.  COMPARISON:  January 18, 2013  FINDINGS: No marrow signal abnormality suggestive of fracture, infection, or  neoplasm. Normal conus signal and morphology. No perispinal abnormality to explain back pain. Hepatic cysts noted, grossly stable from abdominal CT 07/02/2010.  Degenerative changes:  T11-T12: Degenerative disc narrowing with endplate spurring. There is disc bulging without evidence of high-grade canal or foraminal stenosis.  T12- L1: Unremarkable.  L1-L2: Unremarkable.  L2-L3: Unremarkable.  L3-L4: Larger left foraminal and extra foraminal disc herniation on the left, where there is a visible annular fissure. There is mild flattening and posterior displacement of the traversing L3 nerve root. There is advanced facet osteoarthritis at this level with subchondral cyst formation.  L4-L5: Advanced facet osteoarthritis with left joint effusion and grade 1 anterolisthesis. Broad disc bulging that is eccentric to the right, likely with a superimposed foraminal herniation. These changes results in at least moderate bilateral foraminal stenosis, right more than left. There is right lateral recess narrowing, compressing the traversing L5 nerve root.  L5-S1:Degenerative disc disease with narrowing and endplate spurring. Disc bulging and height loss crowds the foramina, without evidence of nerve compression. Facet osteoarthritis with mild spurring.  IMPRESSION: 1. Increased (since September 2014) left foraminal and far lateral disc herniation at L3-4, compressing the L3 nerve root. 2. Unchanged, advanced facet degeneration at L4-5 - as above. Degenerative changes at this level could cause bilateral L4 and right L5 radiculopathy.   Electronically Signed   By: Jorje Guild M.D.   On: 06/29/2013 08:59    Review of Systems  Constitutional: Negative.   HENT: Negative.   Eyes: Negative.   Respiratory: Negative.   Cardiovascular: Negative.   Gastrointestinal: Positive for constipation.       Only one BM in last week.  Using narcotic meds and had colonoscopy last week  Genitourinary: Negative.   Musculoskeletal:  Positive for back pain.  Skin: Negative.   Neurological: Positive for focal weakness.       Left leg  Psychiatric/Behavioral: Negative.     Blood pressure 168/73, pulse 54, temperature 97.4 F (36.3 C), temperature source Oral, resp. rate 18, SpO2 98.00%. Physical Exam  Constitutional: She is oriented to person, place, and time. She appears well-developed and well-nourished.  HENT:  Head: Normocephalic and atraumatic.  Eyes: EOM are normal. Pupils are equal, round, and reactive to light.  Neck: Normal range of motion. Neck supple.  Cardiovascular: Normal rate, regular rhythm, normal heart sounds and intact distal pulses.   Respiratory: Effort normal and breath sounds normal.  GI: Soft. Bowel sounds  are normal.  Musculoskeletal:  Pt lying on the right side with left knee flexed.  Burning of anterior left thigh.  Weak left quad at 4/5  Right 5/5.  + reverse straight leg raise.    Neurological: She is alert and oriented to person, place, and time.  Skin: Skin is warm and dry.  Psychiatric: She has a normal mood and affect.     Assessment/Plan: Left foraminal HNP L3-4 affecting the L3 nerve root.  Left L3 radiculopathy and intractable pain.  PLAN:  Microdiscectomy Left L3-4 for excision of HNP left L3 foramen through far lateral approach.  Peder Allums E 06/30/2013, 2:59 PM

## 2013-06-30 NOTE — Anesthesia Preprocedure Evaluation (Addendum)
Anesthesia Evaluation  Patient identified by MRN, date of birth, ID band Patient awake    Reviewed: Allergy & Precautions, H&P , NPO status , Patient's Chart, lab work & pertinent test results  Airway Mallampati: II TM Distance: >3 FB Neck ROM: Limited    Dental   Pulmonary former smoker,  breath sounds clear to auscultation        Cardiovascular hypertension, Rhythm:Regular Rate:Normal     Neuro/Psych  Headaches,    GI/Hepatic   Endo/Other  diabetes  Renal/GU      Musculoskeletal   Abdominal   Peds  Hematology   Anesthesia Other Findings   Reproductive/Obstetrics                          Anesthesia Physical Anesthesia Plan  ASA: III  Anesthesia Plan: General   Post-op Pain Management:    Induction: Intravenous  Airway Management Planned: Oral ETT  Additional Equipment:   Intra-op Plan:   Post-operative Plan: Extubation in OR  Informed Consent: I have reviewed the patients History and Physical, chart, labs and discussed the procedure including the risks, benefits and alternatives for the proposed anesthesia with the patient or authorized representative who has indicated his/her understanding and acceptance.     Plan Discussed with: CRNA and Surgeon  Anesthesia Plan Comments:         Anesthesia Quick Evaluation

## 2013-06-30 NOTE — Discharge Instructions (Signed)
Take your pain medications as prescribed when you get home. Keep your appointment with your orthopedic surgeon today at 10:30.    Back Pain, Adult    Low back pain is very common. About 1 in 5 people have back pain. The cause of low back pain is rarely dangerous. The pain often gets better over time. About half of people with a sudden onset of back pain feel better in just 2 weeks. About 8 in 10 people feel better by 6 weeks.  CAUSES  Some common causes of back pain include:  Strain of the muscles or ligaments supporting the spine.  Wear and tear (degeneration) of the spinal discs.  Arthritis.  Direct injury to the back. DIAGNOSIS  Most of the time, the direct cause of low back pain is not known. However, back pain can be treated effectively even when the exact cause of the pain is unknown. Answering your caregiver's questions about your overall health and symptoms is one of the most accurate ways to make sure the cause of your pain is not dangerous. If your caregiver needs more information, he or she may order lab work or imaging tests (X-rays or MRIs). However, even if imaging tests show changes in your back, this usually does not require surgery.  HOME CARE INSTRUCTIONS  For many people, back pain returns. Since low back pain is rarely dangerous, it is often a condition that people can learn to manage on their own.  Remain active. It is stressful on the back to sit or stand in one place. Do not sit, drive, or stand in one place for more than 30 minutes at a time. Take short walks on level surfaces as soon as pain allows. Try to increase the length of time you walk each day.  Do not stay in bed. Resting more than 1 or 2 days can delay your recovery.  Do not avoid exercise or work. Your body is made to move. It is not dangerous to be active, even though your back may hurt. Your back will likely heal faster if you return to being active before your pain is gone.  Pay attention to your body when  you bend and lift. Many people have less discomfort when lifting if they bend their knees, keep the load close to their bodies, and avoid twisting. Often, the most comfortable positions are those that put less stress on your recovering back.  Find a comfortable position to sleep. Use a firm mattress and lie on your side with your knees slightly bent. If you lie on your back, put a pillow under your knees.  Only take over-the-counter or prescription medicines as directed by your caregiver. Over-the-counter medicines to reduce pain and inflammation are often the most helpful. Your caregiver may prescribe muscle relaxant drugs. These medicines help dull your pain so you can more quickly return to your normal activities and healthy exercise.  Put ice on the injured area.  Put ice in a plastic bag.  Place a towel between your skin and the bag.  Leave the ice on for 15-20 minutes, 03-04 times a day for the first 2 to 3 days. After that, ice and heat may be alternated to reduce pain and spasms.  Ask your caregiver about trying back exercises and gentle massage. This may be of some benefit.  Avoid feeling anxious or stressed. Stress increases muscle tension and can worsen back pain. It is important to recognize when you are anxious or stressed and learn ways  to manage it. Exercise is a great option. SEEK MEDICAL CARE IF:  You have pain that is not relieved with rest or medicine.  You have pain that does not improve in 1 week.  You have new symptoms.  You are generally not feeling well. SEEK IMMEDIATE MEDICAL CARE IF:  You have pain that radiates from your back into your legs.  You develop new bowel or bladder control problems.  You have unusual weakness or numbness in your arms or legs.  You develop nausea or vomiting.  You develop abdominal pain.  You feel faint. Document Released: 05/04/2005 Document Revised: 11/03/2011 Document Reviewed: 09/22/2010  [facility] Patient Information 2014 [place],  [place].

## 2013-06-30 NOTE — Discharge Instructions (Signed)
    No lifting greater than 10 lbs. Avoid bending, stooping and twisting. Walk in house for first week them may start to get out slowly increasing distance up to one mile by 3 weeks post op. Keep incision dry for 3 days, may use tegaderm or similar water impervious dressing.  

## 2013-06-30 NOTE — Progress Notes (Signed)
Call to EKG dept., they are faxing a copy of a resting 12 lead EKG to [facility]

## 2013-06-30 NOTE — Transfer of Care (Signed)
Immediate Anesthesia Transfer of Care Note  Patient: Angela Pugh  Procedure(s) Performed: Procedure(s): Left L3-4 Extraforaminal approach to excise far lateral herniated nucleus pulposus (Left)  Patient Location: PACU  Anesthesia Type:General  Level of Consciousness: awake and alert   Airway & Oxygen Therapy: Patient Spontanous Breathing and Patient connected to nasal cannula oxygen  Post-op Assessment: Report given to PACU RN, Post -op Vital signs reviewed and stable and Patient moving all extremities X 4  Post vital signs: Reviewed and stable  Complications: No apparent anesthesia complications

## 2013-06-30 NOTE — Progress Notes (Signed)
Call to Dr. Charlies Silvers, PCP & cardiologist for Ms. Wisnieski;  for last ov note & recent cardiac care.  Dr. Tonita Phoenix  Office is closed on Friday.

## 2013-06-30 NOTE — ED Notes (Signed)
Dr. Marnette Burgess made aware of patients bradycardia. Pt sleeping during bradycardia.

## 2013-06-30 NOTE — Progress Notes (Signed)
BP elevated, even after letting pt. Rest on her side. 202/110, pt. Reports pain score of 8.

## 2013-06-30 NOTE — Op Note (Signed)
06/30/2013  5:08 PM  PATIENT:  Angela Pugh  68 y.o. female  MRN: [id number]  OPERATIVE REPORT  PRE-OPERATIVE DIAGNOSIS:  Left L3-4 herniated nucleus pulposus  POST-OPERATIVE DIAGNOSIS:  Far lateral HNP left L3-4. With foramenal and extraforamenal L3 nerve compression.  PROCEDURE:  Procedure(s): Left L3-4 Extraforaminal approach to excise far lateral herniated nucleus pulposus, OR Microscope.     SURGEON:  Jessy Oto, MD     ASSISTANT:  Phillips Hay, PA-C  (Present throughout the entire procedure and necessary for completion of procedure in a timely manner)     ANESTHESIA:  General, supplemented with local infiltration with marcaine 1/2% with 1/200,000 epi 10cc mixed with 20cc of exparel total of 30 cc,  Dr. Tobias Alexander.    COMPLICATIONS:  None.  DRAINS: Foley to SD during the case removed at the end of the case.  EBL: >50 cc    PROCEDURE: The patient was met in the holding area, and the appropriate left L3-4 identified and marked with an "X" and my initials.  The patient was then transported to [facility] #5. The patient was then placed under  general anesthesia without difficulty and was placed on the operative table in a prone position. Wilson frame and sliding OR table. The patient received appropriate preoperative antibiotic prophylaxis Ancef 2 g.   The thoracolumbar spine then prepped using sterile conditions with DuraPrep and draped using sterile technique.  Time-out procedure was called and correct. Loupe magnification and head lamp was used. 2 spinal needles placed to the left of the midline by 4 cm and intraoperative lateral C-arm demonstrated the lower needle directed towards the L3-4 disc on the left side. Skin infiltrated in the left side paramedian skin area with Marcaine 1/2% with 1-200,000 epinephrine 10 cc mixed with exparel 20 cc total 30 cc used. Skin incision approximately 3-1/2 cm in length sagittal orientation paramedian approximately 4 cm for the left of the  midline. Incision through skin and subcutaneous layers to the lumbodorsal fascia bleeders controlled using electrocautery. Lumbodorsal fascia then incised in line with the skin incision and then blunt dissection using a finger down to the level of the transverse processes of the [place] lateral approach. The boss retractror blades then placed in the incision and lateral C-arm demonstrated the blades slightly superior to the L3-4 disc space so that these were redirected and exposure obtained of the posterior lateral aspect of the L3-4 level identifying the transverse process of L3 and L4  medial and lateral retractors. Small amount of muscle was debrided over the posterior aspect of the intertransverse membrane. And then a small amount of the superior aspect of the transverse process of L4 was resected using 2 and 3 mm Kerrisons releasing the intertransverse membrane and ligament. The operating room microscope carefully draped brought into the field sterilely. Under the operating room microscope then the superior medial and to the transverse process of L4 was carefully exposed and the pedicle of L4 identified. Small leash of vessels cauterized using bipolar electrocautery just medial and superior to the transverse process of L4 at its intersection with the pedicle and the lateral aspect of the superior articular process of L4. Penfield 4 was then used to carefully palpate along the superior lateral aspect of the L4 pedicle identifing the disc space. The derricho retractor and then used the carefully retract the inferior medial aspect of the L3 nerve root superiorly and laterally. With this then identified with careful palpation using a Penfield 4. 15 blade scalpel then  used to perform a vertical incision into the posterior lateral aspect of the L3-4 disc and the opening into the disc developed using micropituitary as well as pituitaries with teeth directing the pituitaries medially, resecting disc material. Several  small fragments of disc material were resected from the area just beneath the annulus. Intraoperative C-arm fluoroscopy identified the Penfield 4 located just inferior to the L3-4 disc completing localization during the procedure. Careful inspection of the neuroforamen of a hockey-stick nerve probe demonstrated neuroforamen to be free and the L3 nerve root exiting without further compression following the resection of the small amount of subligamentous disc material. Additionally the nerve root was freed from pressure posteriorly this area by resecting portion of the intertransverse ligament as it pressed nerve forward against this protruded disc. Irrigation was then carried out using copious amounts of irrigant solution. Minimal bleeding is present felt to represent bleeding from the disc itself. There was no active bleeding that would be cauterized her otherwise. In careful then carefully the Boss McCollough retractors were then removed exploring the incision with its removal no active bleeders were noted. Incision then closed using a UR 6-0 Vicryl suture to the lumbodorsal fascia and subcutaneous layers. Superficial layers of proximal with interrupted 2-0 Vicryl sutures and the skin closed with a running subcutaneous stitch of 4-0 Vicryl Dermabond was applied then MedPlex bandage. All instrument and sponge counts were correct. Patient was then returned to supine position reactivated extubated and returned to recovery room in satisfactory condition.  Phillips Hay PA-C perform the duties of assistant surgeon during this case. She was present from the beginning of the case to the end of the case assisting in transfer the patient from his stretcher to the OR table and back to the stretcher at the end of the case. Assisted in careful retraction and suction of the laminectomy site delicate neural structures operating under the operating room microscope. She performed closure of the incision from the fascia to the  skin applying the dressing.         [name]  06/30/2013, 5:08 PM

## 2013-06-30 NOTE — Brief Op Note (Signed)
06/30/2013  5:05 PM  PATIENT:  Orvan July  68 y.o. female  PRE-OPERATIVE DIAGNOSIS:  Left L3-4 herniated nucleus pulposus  POST-OPERATIVE DIAGNOSIS:  Far lateral HNP left L3-4  PROCEDURE:  Procedure(s): Left L3-4 Extraforaminal approach to excise far lateral herniated nucleus pulposus (N/A) OR microscope.  SURGEON:  Surgeon(s) and Role:    Jessy Oto, MD - Primary  PHYSICIAN ASSISTANT: Phillips Hay, PA-C   ANESTHESIA:   local and general Dr. Tobias Alexander.  EBL:  Total I/O In: 1000 [I.V.:1000] Out: -   BLOOD ADMINISTERED:none  DRAINS: Urinary Catheter (Foley)   LOCAL MEDICATIONS USED:  MARCAINE1/2% with 1/200,000 epi 10cc/exparel 20 cc  30cc total.  SPECIMEN:  No Specimen  DISPOSITION OF SPECIMEN:  N/A  COUNTS:  YES  TOURNIQUET:  * No tourniquets in log *  DICTATION: .Dragon Dictation  PLAN OF CARE: Admit for overnight observation  PATIENT DISPOSITION:  PACU - hemodynamically stable.   Delay start of Pharmacological VTE agent (>24hrs) due to surgical blood loss or risk of bleeding: yes

## 2013-06-30 NOTE — ED Notes (Signed)
Dr. Opitz at bedside. 

## 2013-06-30 NOTE — Interval H&P Note (Signed)
History and Physical Interval Note:  06/30/2013 2:59 PM  Angela Pugh  has presented today for surgery, with the diagnosis of Left L3-4 herniated nucleus pulposus  The various methods of treatment have been discussed with the patient and family. After consideration of risks, benefits and other options for treatment, the patient has consented to  Procedure(s): Left L3-4 Extraforaminal approach to excise far lateral herniated nucleus pulposus (N/A) as a surgical intervention .  The patient's history has been reviewed, patient examined, no change in status, stable for surgery.  I have reviewed the patient's chart and labs.  Questions were answered to the patient's satisfaction.     [name] E

## 2013-06-30 NOTE — ED Notes (Addendum)
Pts son, Flonnie Overman, is driving patient home.

## 2013-06-30 NOTE — ED Notes (Signed)
Pt presents to ED via [facility] with complaints of left leg pain and lower back pain. Was seen here yesterday and diagnosed with sciatica and was given dilaudid, Toradol, and robaxin and reports the pain medication wore off and pain is back. Pt laying restlessly in bed loudly crying and stating "[employer], please take my leg away, please just cut my leg off!." VS: BP-160/96, HR-100, RR-22.

## 2013-06-30 NOTE — Preoperative (Signed)
Beta Blockers   Reason not to administer Beta Blockers:Not Applicable 

## 2013-07-01 LAB — GLUCOSE, CAPILLARY: Glucose-Capillary: 104 mg/dL — ABNORMAL HIGH (ref 70–99)

## 2013-07-01 MED ORDER — HYDROCODONE-IBUPROFEN 5-200 MG PO TABS: 1.0000 | ORAL_TABLET | Freq: Three times a day (TID) | ORAL | Status: DC | PRN

## 2013-07-01 NOTE — Evaluation (Signed)
Occupational Therapy Evaluation and Discharge Patient Details Name: NINAMARIE KEEL MRN: [id number] DOB: 12-09-1945 Today's Date: 07/01/2013 Time: 5681-2751 OT Time Calculation (min): 22 min  OT Assessment / Plan / Recommendation History of present illness status post lumbar microdiscectomy   Clinical Impression   This 68 yo female admitted and underwent above presents to acute OT with all education completed. Will D/C from acute OT.    OT Assessment  Patient does not need any further OT services    Follow Up Recommendations  No OT follow up       Equipment Recommendations  None recommended by OT          Precautions / Restrictions Precautions Precautions: Back Precaution Booklet Issued: Yes (comment) Restrictions Weight Bearing Restrictions: No       ADL  Tub/Shower Transfer: Set up (side-step) Tub/Shower Transfer Method: Ambulating Equipment Used: Rolling walker Transfers/Ambulation Related to ADLs: Mod I for all with RW ADL Comments: Pt able to cross her legs to get to her feet for LBB/D and she is aware that when she is getting pants and underwear on that she needs to put her LLE in first since it is the weaker leg.     Acute Rehab OT Goals Patient Stated Goal: Home today  Visit Information  Last OT Received On: 07/01/13 Assistance Needed: +1 History of Present Illness: status post lumbar microdiscectomy       Prior Functioning     Home Living Family/patient expects to be discharged to:: Private residence Living Arrangements: Children Available Help at Discharge: Family Type of Home: House Home Access: Stairs to enter [employer] of Steps: 1 (threshold) Entrance Stairs-Rails: None Home Layout: One level Home Equipment: Environmental consultant - 2 wheels;Bedside commode (a seat that she has used as a shower seat in the past) Additional Comments: instructed pt to use RW at d/c home for balance and energy conservation Prior Function Level of Independence:  Independent Communication Communication: No difficulties Dominant Hand: Right         Vision/Perception Vision - History Patient Visual Report: No change from baseline   Cognition  Cognition Arousal/Alertness: Awake/alert Behavior During Therapy: WFL for tasks assessed/performed Overall Cognitive Status: Within Functional Limits for tasks assessed    Extremity/Trunk Assessment Upper Extremity Assessment Upper Extremity Assessment: Overall WFL for tasks assessed     Mobility General bed mobility comments: Pt up in recliner upon arrival, I made her aware that it is better on her back if she does not lean back in the recliner but rather sit straight up in it. Transfers Overall transfer level: Modified independent           End of Session OT - End of Session Equipment Utilized During Treatment: Rolling walker Activity Tolerance: Patient tolerated treatment well Patient left: in chair;with call bell/phone within reach;with family/visitor present   Functional Assessment Tool Used: Clinical observation Functional Limitation: Self care Self Care Current Status (Z0017): At least 1 percent but less than 20 percent impaired, limited or restricted Self Care Goal Status (C9449): At least 1 percent but less than 20 percent impaired, limited or restricted Self Care Discharge Status [phone]): At least 1 percent but less than 20 percent impaired, limited or restricted   Almon Register [phone] 07/01/2013, 12:05 PM

## 2013-07-01 NOTE — ED Provider Notes (Signed)
CSN: [id number]     Arrival date & time 06/30/13  0122 History   First MD Initiated Contact with Patient 06/30/13 0136     Chief Complaint  Patient presents with  . Leg Pain     (Consider location/radiation/quality/duration/timing/severity/associated sxs/prior Treatment) HPI History provided by patient. Left low back pain radiating to left leg worse or lasts a few days. Pain sharp in quality and severe without known aggravating or alleviating factors. This is patient's third ED visit for the symptoms. With her last visit she had an MRI, scheduled followup with her orthopedic surgeon and has an appointment at 10 AM. Last night in the ER she was given IV Dilaudid did help her pain and eventually she felt comfortable being discharged home. Tonight at home, pain medications not helping. She denies any weakness or numbness. No incontinence of bowel or bladder. No fevers or chills. No trauma.  Past Medical History  Diagnosis Date  . Hypertension   . Sinus complaint   . Diabetes mellitus     diet controlled, no meds now, was on insulin & po treatment at one time    . Headache(784.0)     occas. relative to sinuses   . Arthritis     back, knees, shoulder, hands , injections in pelvis, for bone spurs   Past Surgical History  Procedure Laterality Date  . Abdominal hysterectomy    . Bone spurs      removed  . Anterior fusion cervical spine  2000   No family history on file. History  Substance Use Topics  . Smoking status: Former Research scientist (life sciences)  . Smokeless tobacco: Former Systems developer    Quit date: 06/30/1982  . Alcohol Use: Yes     Comment: ocass. - social    OB History   Grav Para Term Preterm Abortions TAB SAB Ect Mult Living                 Review of Systems  Constitutional: Negative for fever and chills.  Respiratory: Negative for shortness of breath.   Cardiovascular: Negative for chest pain.  Gastrointestinal: Negative for vomiting and abdominal pain.  Genitourinary: Negative for flank  pain.  Musculoskeletal: Positive for back pain. Negative for neck pain.  Skin: Negative for rash.  Neurological: Negative for weakness, numbness and headaches.  All other systems reviewed and are negative.      Allergies  Acetaminophen  Home Medications   Current Outpatient Rx  Name  Route  Sig  Dispense  Refill  . HYDROcodone-acetaminophen (NORCO) 5-325 MG per tablet   Oral   Take 1-2 tablets by mouth every 4 (four) hours as needed for moderate pain.   60 tablet   0   . methocarbamol (ROBAXIN) 500 MG tablet   Oral   Take 1 tablet (500 mg total) by mouth every 8 (eight) hours as needed for muscle spasms (spasm).   30 tablet   0    BP 128/69  Pulse 54  Temp(Src) 97.5 F (36.4 C) (Oral)  Resp 18  SpO2 98% Physical Exam  Constitutional: She is oriented to person, place, and time. She appears well-developed and well-nourished.  HENT:  Head: Normocephalic and atraumatic.  Eyes: EOM are normal. Pupils are equal, round, and reactive to light.  Neck: Neck supple.  Cardiovascular: Regular rhythm and intact distal pulses.   Pulmonary/Chest: Effort normal. No respiratory distress.  Musculoskeletal:  Tenderness lower lumbar and over left sciatic region, patient laying on her right side with your knees flexed,  limited range of motion secondary to severe pain. Sensorium to light touch grossly intact lower extremities. Dorsi plantar flexion equal.  Neurological: She is alert and oriented to person, place, and time.  Skin: Skin is warm and dry.    ED Course  Procedures (including critical care time) Labs Review Labs Reviewed - No data to display Imaging Review Dg Lumbar Spine 2-3 Views  06/30/2013   CLINICAL DATA:  Left L3-L4 extra foraminal approach to excise far lateral herniated disc  EXAM: DG C-ARM 1-60 MIN; LUMBAR SPINE - 2-3 VIEW  COMPARISON:  MR L SPINE W/O dated 06/29/2013  FLUOROSCOPY TIME:  5 seconds  FINDINGS: Two spot lateral intraoperative radiographic images of  the lower lumbar spine are provided for review.  Radiographic labeled #1 demonstrates a radiopaque marking instrument tip overlying the soft tissues just caudal to the L3-L4 intervertebral disc space.  Radiograph labeled #2 demonstrates a radiopaque marking instrument tip overlying the soft tissues just cranial to the L3-L4 intervertebral disc space.  Additional radiopaque surgical support apparatus is seen posterior to the operative site.  IMPRESSION: Intraoperative localization of L3-L4 as above.   Electronically Signed   By: Sandi Mariscal M.D.   On: 06/30/2013 17:16   Mr Lumbar Spine Wo Contrast  06/29/2013   CLINICAL DATA:  Sciatic it with radiation to the left.  EXAM: MRI LUMBAR SPINE WITHOUT CONTRAST  TECHNIQUE: Multiplanar, multisequence MR imaging was performed. No intravenous contrast was administered.  COMPARISON:  January 18, 2013  FINDINGS: No marrow signal abnormality suggestive of fracture, infection, or neoplasm. Normal conus signal and morphology. No perispinal abnormality to explain back pain. Hepatic cysts noted, grossly stable from abdominal CT 07/02/2010.  Degenerative changes:  T11-T12: Degenerative disc narrowing with endplate spurring. There is disc bulging without evidence of high-grade canal or foraminal stenosis.  T12- L1: Unremarkable.  L1-L2: Unremarkable.  L2-L3: Unremarkable.  L3-L4: Larger left foraminal and extra foraminal disc herniation on the left, where there is a visible annular fissure. There is mild flattening and posterior displacement of the traversing L3 nerve root. There is advanced facet osteoarthritis at this level with subchondral cyst formation.  L4-L5: Advanced facet osteoarthritis with left joint effusion and grade 1 anterolisthesis. Broad disc bulging that is eccentric to the right, likely with a superimposed foraminal herniation. These changes results in at least moderate bilateral foraminal stenosis, right more than left. There is right lateral recess narrowing,  compressing the traversing L5 nerve root.  L5-S1:Degenerative disc disease with narrowing and endplate spurring. Disc bulging and height loss crowds the foramina, without evidence of nerve compression. Facet osteoarthritis with mild spurring.  IMPRESSION: 1. Increased (since September 2014) left foraminal and far lateral disc herniation at L3-4, compressing the L3 nerve root. 2. Unchanged, advanced facet degeneration at L4-5 - as above. Degenerative changes at this level could cause bilateral L4 and right L5 radiculopathy.   Electronically Signed   By: Jorje Guild M.D.   On: 06/29/2013 08:59   Dg C-arm 1-60 Min  06/30/2013   CLINICAL DATA:  Left L3-L4 extra foraminal approach to excise far lateral herniated disc  EXAM: DG C-ARM 1-60 MIN; LUMBAR SPINE - 2-3 VIEW  COMPARISON:  MR L SPINE W/O dated 06/29/2013  FLUOROSCOPY TIME:  5 seconds  FINDINGS: Two spot lateral intraoperative radiographic images of the lower lumbar spine are provided for review.  Radiographic labeled #1 demonstrates a radiopaque marking instrument tip overlying the soft tissues just caudal to the L3-L4 intervertebral disc space.  Radiograph  labeled #2 demonstrates a radiopaque marking instrument tip overlying the soft tissues just cranial to the L3-L4 intervertebral disc space.  Additional radiopaque surgical support apparatus is seen posterior to the operative site.  IMPRESSION: Intraoperative localization of L3-L4 as above.   Electronically Signed   By: Sandi Mariscal M.D.   On: 06/30/2013 17:16    IV Dilaudid provided.  MRI results from previous visit reviewed as above.   On recheck pain significantly improved. Patient requesting to be discharged home so that she can go to her scheduled appointment. She is requesting another dose of Dilaudid in case pain returns. Strict return precautions provided verbalizes understood.  MDM   Final diagnoses:  Left sided sciatica  Back pain   Presents with severe pain, diagnosed with  herniated disc earlier in the emergency department. Improved with IV narcotics. Followup orthopedic surgeon.    Teressa Lower, MD 07/01/13 [phone]

## 2013-07-01 NOTE — Progress Notes (Signed)
UR Completed.  Angela Pugh [id number] 07/01/2013

## 2013-07-01 NOTE — Discharge Summary (Signed)
  Discharge to home in stable condition status post lumbar microdiscectomy. Patient is neurovascularly intact in both lower extremities. Patient states she is allergic to Tylenol so a prescription was provided for Vicoprofen. Followup  in 2 weeks.

## 2013-07-01 NOTE — Progress Notes (Signed)
   CARE MANAGEMENT NOTE 07/01/2013  Patient:  Angela Pugh   Account Number:  [id number]  Date Initiated:  07/01/2013  Documentation initiated by:  [facility]  Subjective/Objective Assessment:   adm: Left L3-4 herniated nucleus pulposus     Action/Plan:   discharge planing   Anticipated DC Date:  07/01/2013   Anticipated DC Plan:  [place]  CM consult      [facility] Choice  HOME HEALTH   Choice offered to / List presented to:  C-1 Patient        [place] arranged  [facility]      [place].   Status of service:  Completed, signed off Medicare Important Message given?   (If response is "NO", the following Medicare IM given date fields will be blank) Date Medicare IM given:   Date Additional Medicare IM given:    Discharge Disposition:  [place]  Per UR Regulation:    If discussed at Long Length of Stay Meetings, dates discussed:    Comments:  07/01/13 10:13 CM spoke with pt for choice and pt  requests [facility] to render her HHPT.  Address and contact numbers verified and referral faxed to [facility] for HHPT. Pt states she does not need ordered RW and 3n1 bc she has these at home. No other CM needs were communicated.  Mariane Masters, BSN, CM [phone].

## 2013-07-01 NOTE — Evaluation (Signed)
Physical Therapy Evaluation Patient Details Name: Angela Pugh MRN: [id number] DOB: August 20, 1945 Today's Date: 07/01/2013 Time: 4081-4481 PT Time Calculation (min): 15 min  PT Assessment / Plan / Recommendation History of Present Illness  status post lumbar microdiscectomy  Clinical Impression  Pt presents at mod I level with RW. Pt educated on need for RW at home to decrease risk of falls, pt agrees to use her RW at home.  Pt will benefit from HHPT for balance and strengthening, no further acute PT needs identified at this time.    PT Assessment  All further PT needs can be met in the next venue of care    Follow Up Recommendations  Home health PT    Does the patient have the potential to tolerate intense rehabilitation      Barriers to Discharge        Equipment Recommendations  None recommended by PT    Recommendations for Other Services     Frequency      Precautions / Restrictions Restrictions Weight Bearing Restrictions: No   Pertinent Vitals/Pain No c/o pain, pt states "I feel so much better!"      Mobility  Bed Mobility Overal bed mobility: Modified Independent Transfers Overall transfer level: Modified independent Ambulation/Gait Ambulation/Gait assistance: Modified independent (Device/Increase time);Min assist Ambulation Distance (Feet): 20 Feet Assistive device: Rolling walker (2 wheeled) General Gait Details: pt requires min A without AD, mod I with RW. instructed pt to use RW at all times at home for safety, pt agrees    Exercises     PT Diagnosis: Difficulty walking;Generalized weakness  PT Problem List: Decreased strength;Decreased balance;Decreased activity tolerance;Decreased knowledge of use of DME;Decreased mobility PT Treatment Interventions:       PT Goals(Current goals can be found in the care plan section) Acute Rehab PT Goals PT Goal Formulation: No goals set, d/c therapy  Visit Information  Last PT Received On: 07/01/13 Assistance  Needed: +1 History of Present Illness: status post lumbar microdiscectomy       Prior Functioning  Home Living Family/patient expects to be discharged to:: Private residence Living Arrangements: Children Available Help at Discharge: Family Type of Home: House Home Access: Stairs to enter [employer] of Steps: 1 (threshold) Entrance Stairs-Rails: None Home Layout: One level Home Equipment: Environmental consultant - 2 wheels;Bedside commode Additional Comments: instructed pt to use RW at d/c home for balance and energy conservation Prior Function Level of Independence: Independent Communication Communication: No difficulties    Cognition  Cognition Arousal/Alertness: Awake/alert Behavior During Therapy: WFL for tasks assessed/performed Overall Cognitive Status: Within Functional Limits for tasks assessed    Extremity/Trunk Assessment Upper Extremity Assessment Upper Extremity Assessment: Defer to OT evaluation Lower Extremity Assessment Lower Extremity Assessment: Generalized weakness (grossly 3/5 strength B LEs) Cervical / Trunk Assessment Cervical / Trunk Assessment: Normal   Balance Balance Overall balance assessment: Modified Independent (during toileting and hygiene tasks)  End of Session PT - End of Session Activity Tolerance: Patient tolerated treatment well Patient left: in bed;with call bell/phone within reach Nurse Communication: Mobility status  GP Functional Assessment Tool Used: clinical judgement Functional Limitation: Mobility: Walking and moving around Mobility: Walking and Moving Around Current Status ([id number]): At least 1 percent but less than 20 percent impaired, limited or restricted Mobility: Walking and Moving Around Goal Status [phone]): At least 1 percent but less than 20 percent impaired, limited or restricted Mobility: Walking and Moving Around Discharge Status [phone]): At least 1 percent but less than 20 percent impaired,  limited or restricted    Tashica Provencio 07/01/2013, 10:48 AM

## 2013-07-03 NOTE — Anesthesia Postprocedure Evaluation (Signed)
  Anesthesia Post-op Note  Anesthesia Post Note  Patient: Angela Pugh  Procedure(s) Performed: Procedure(s) (LRB): Left L3-4 Extraforaminal approach to excise far lateral herniated nucleus pulposus (Left)  Anesthesia type: general  Patient location: PACU  Post pain: Pain level controlled  Post assessment: Patient's Cardiovascular Status Stable  Post vital signs: Reviewed and stable  Level of consciousness: sedated  Complications: No apparent anesthesia complications

## 2013-07-04 ENCOUNTER — Encounter (HOSPITAL_COMMUNITY): Payer: Self-pay | Admitting: Specialist

## 2013-07-25 ENCOUNTER — Ambulatory Visit (INDEPENDENT_AMBULATORY_CARE_PROVIDER_SITE_OTHER): Payer: Medicare Other

## 2013-07-25 VITALS — BP 147/87 | HR 72 | Resp 17 | Ht 69.0 in | Wt 180.0 lb

## 2013-07-25 DIAGNOSIS — Q828 Other specified congenital malformations of skin: Secondary | ICD-10-CM

## 2013-07-25 DIAGNOSIS — M1990 Unspecified osteoarthritis, unspecified site: Secondary | ICD-10-CM

## 2013-07-25 DIAGNOSIS — E1149 Type 2 diabetes mellitus with other diabetic neurological complication: Secondary | ICD-10-CM

## 2013-07-25 DIAGNOSIS — E1140 Type 2 diabetes mellitus with diabetic neuropathy, unspecified: Secondary | ICD-10-CM

## 2013-07-25 DIAGNOSIS — L608 Other nail disorders: Secondary | ICD-10-CM

## 2013-07-25 DIAGNOSIS — M79609 Pain in unspecified limb: Secondary | ICD-10-CM

## 2013-07-25 NOTE — Progress Notes (Signed)
   Subjective:    Patient ID: Angela Pugh, female    DOB: 1946/02/14, 68 y.o.   MRN: [id number] Pt presents for debridement of B/L 1 - 5 toenails and callouses on B/L feet. HPI    Review of Systems no new changes or findings     Objective:   Physical Exam Neurovascular status is intact with pedal pulses palpable bilateral Refill time 3 seconds all digits epicritic and proprioceptive sensations diminished on Semmes Weinstein testing to the digits and plantar forefoot area patient has had keratoses sub-hallux IP joint and subsecond MTP area bilateral secondary plantarflexed metatarsal patient's and she was here last in the emergency back surgery for left-sided still affected with some numbness and tingling currently had a herniation are some compression of the nerve. Patient again has multiple keratoses nails thick brittle criptotic incurvated friable x10       Assessment & Plan:  Assessment diabetes with peripheral neuropathy and complications patient also may have some mild angiopathy neuro neurologically decreased epicritic sensations confirmed nails debrided 2 through 5 bilateral PT hallux nails are debrided back a partially avulsed patient also has keratoses HD 5 right and keratoses sub-hallux IP joint and subsecond MTP area bilateral debridement at this time Demani Weyrauch for future palliative nail care and callus care and as-needed basis  Harriet Masson DPM

## 2013-07-31 ENCOUNTER — Other Ambulatory Visit: Payer: Self-pay | Admitting: Cardiology

## 2013-07-31 DIAGNOSIS — R1310 Dysphagia, unspecified: Secondary | ICD-10-CM

## 2013-07-31 DIAGNOSIS — R109 Unspecified abdominal pain: Secondary | ICD-10-CM

## 2013-08-04 ENCOUNTER — Ambulatory Visit
Admission: RE | Admit: 2013-08-04 | Discharge: 2013-08-04 | Disposition: A | Discharge status: Home / Self Care | Payer: Medicare Other | Source: Ambulatory Visit | Attending: Cardiology | Admitting: Cardiology

## 2013-08-04 ENCOUNTER — Other Ambulatory Visit: Payer: Self-pay | Admitting: Cardiology

## 2013-08-04 DIAGNOSIS — R1310 Dysphagia, unspecified: Secondary | ICD-10-CM

## 2013-08-04 DIAGNOSIS — R109 Unspecified abdominal pain: Secondary | ICD-10-CM

## 2013-08-28 ENCOUNTER — Other Ambulatory Visit: Payer: Self-pay

## 2013-08-28 DIAGNOSIS — Z1231 Encounter for screening mammogram for malignant neoplasm of breast: Secondary | ICD-10-CM

## 2013-09-25 ENCOUNTER — Ambulatory Visit: Payer: Medicare Other

## 2013-09-29 ENCOUNTER — Other Ambulatory Visit: Payer: Self-pay | Admitting: Specialist

## 2013-09-29 DIAGNOSIS — M545 Low back pain, unspecified: Secondary | ICD-10-CM

## 2013-09-29 DIAGNOSIS — M5410 Radiculopathy, site unspecified: Secondary | ICD-10-CM

## 2013-09-29 DIAGNOSIS — M21379 Foot drop, unspecified foot: Secondary | ICD-10-CM

## 2013-10-02 ENCOUNTER — Ambulatory Visit
Admission: RE | Admit: 2013-10-02 | Discharge: 2013-10-02 | Disposition: A | Discharge status: Home / Self Care | Payer: Medicare Other | Source: Ambulatory Visit | Attending: Specialist | Admitting: Specialist

## 2013-10-02 DIAGNOSIS — M545 Low back pain, unspecified: Secondary | ICD-10-CM

## 2013-10-02 DIAGNOSIS — M21379 Foot drop, unspecified foot: Secondary | ICD-10-CM

## 2013-10-02 DIAGNOSIS — M5410 Radiculopathy, site unspecified: Secondary | ICD-10-CM

## 2013-10-02 MED ORDER — GADOBENATE DIMEGLUMINE 529 MG/ML IV SOLN
14.0000 mL | Freq: Once | INTRAVENOUS | Status: AC | PRN
  Administered 2013-10-02: 14 mL via INTRAVENOUS

## 2013-10-31 ENCOUNTER — Ambulatory Visit: Payer: Medicare Other

## 2013-11-07 ENCOUNTER — Ambulatory Visit (INDEPENDENT_AMBULATORY_CARE_PROVIDER_SITE_OTHER): Payer: Medicare Other

## 2013-11-07 DIAGNOSIS — E1149 Type 2 diabetes mellitus with other diabetic neurological complication: Secondary | ICD-10-CM

## 2013-11-07 DIAGNOSIS — E1142 Type 2 diabetes mellitus with diabetic polyneuropathy: Secondary | ICD-10-CM

## 2013-11-07 DIAGNOSIS — Q828 Other specified congenital malformations of skin: Secondary | ICD-10-CM

## 2013-11-07 DIAGNOSIS — L608 Other nail disorders: Secondary | ICD-10-CM

## 2013-11-07 DIAGNOSIS — E1140 Type 2 diabetes mellitus with diabetic neuropathy, unspecified: Secondary | ICD-10-CM

## 2013-11-07 DIAGNOSIS — M79673 Pain in unspecified foot: Secondary | ICD-10-CM

## 2013-11-07 DIAGNOSIS — M79609 Pain in unspecified limb: Secondary | ICD-10-CM

## 2013-11-07 NOTE — Progress Notes (Signed)
   Subjective:    Patient ID: Angela Pugh, female    DOB: [birth year], 68 y.o.   MRN: [id number]  HPI Comments: Pt presents for debridement of 10 toenails and callouses.     Review of Systems no new findings or systemic changes noted     Objective:   Physical Exam Neurovascular status is intact pedal pulses palpable DP +2/4 PT one over 4 bilateral Semmes Weinstein testing confirms diminished epicritic sensation to the toes digits and plantar foot. Keratoses sub-hallux IP joint and subsecond MTP area bilateral nails thick dystrophic criptotic discolored and brittle one through 5 bilateral. No open wounds or ulcerations noted no other new changes medication her status      Assessment & Plan:  Assessment diabetes with peripheral neuropathy and complications multiple dystrophic nails are debrided x10 also debridement of keratoses multiple spots both feet return in 3 months for continued palliative care in the future as needed  Harriet Masson DPM

## 2013-11-07 NOTE — Patient Instructions (Signed)
Diabetes and Foot Care Diabetes may cause you to have problems because of poor blood supply (circulation) to your feet and legs. This may cause the skin on your feet to become thinner, break easier, and heal more slowly. Your skin may become dry, and the skin may peel and crack. You may also have nerve damage in your legs and feet causing decreased feeling in them. You may not notice minor injuries to your feet that could lead to infections or more serious problems. Taking care of your feet is one of the most important things you can do for yourself.  HOME CARE INSTRUCTIONS  Wear shoes at all times, even in the house. Do not go barefoot. Bare feet are easily injured.  Check your feet daily for blisters, cuts, and redness. If you cannot see the bottom of your feet, use a mirror or ask someone for help.  Wash your feet with warm water (do not use hot water) and mild soap. Then pat your feet and the areas between your toes until they are completely dry. Do not soak your feet as this can dry your skin.  Apply a moisturizing lotion or petroleum jelly (that does not contain alcohol and is unscented) to the skin on your feet and to dry, brittle toenails. Do not apply lotion between your toes.  Trim your toenails straight across. Do not dig under them or around the cuticle. File the edges of your nails with an emery board or nail file.  Do not cut corns or calluses or try to remove them with medicine.  Wear clean socks or stockings every day. Make sure they are not too tight. Do not wear knee-high stockings since they may decrease blood flow to your legs.  Wear shoes that fit properly and have enough cushioning. To break in new shoes, wear them for just a few hours a day. This prevents you from injuring your feet. Always look in your shoes before you put them on to be sure there are no objects inside.  Do not cross your legs. This may decrease the blood flow to your feet.  If you find a minor scrape,  cut, or break in the skin on your feet, keep it and the skin around it clean and dry. These areas may be cleansed with mild soap and water. Do not cleanse the area with peroxide, alcohol, or iodine.  When you remove an adhesive bandage, be sure not to damage the skin around it.  If you have a wound, look at it several times a day to make sure it is healing.  Do not use heating pads or hot water bottles. They may burn your skin. If you have lost feeling in your feet or legs, you may not know it is happening until it is too late.  Make sure your health care provider performs a complete foot exam at least annually or more often if you have foot problems. Report any cuts, sores, or bruises to your health care provider immediately. SEEK MEDICAL CARE IF:   You have an injury that is not healing.  You have cuts or breaks in the skin.  You have an ingrown nail.  You notice redness on your legs or feet.  You feel burning or tingling in your legs or feet.  You have pain or cramps in your legs and feet.  Your legs or feet are numb.  Your feet always feel cold. SEEK IMMEDIATE MEDICAL CARE IF:   There is increasing redness,   swelling, or pain in or around a wound.  There is a red line that goes up your leg.  Pus is coming from a wound.  You develop a fever or as directed by your health care provider.  You notice a bad smell coming from an ulcer or wound. Document Released: 05/01/2000 Document Revised: 01/04/2013 Document Reviewed: 10/11/2012 Exit[employer] Patient Information 2015 [employer]. This information is not intended to replace advice given to you by your health care provider. Make sure you discuss any questions you have with your health care provider.  

## 2014-02-13 ENCOUNTER — Ambulatory Visit (INDEPENDENT_AMBULATORY_CARE_PROVIDER_SITE_OTHER): Payer: Medicare Other

## 2014-02-13 DIAGNOSIS — E1140 Type 2 diabetes mellitus with diabetic neuropathy, unspecified: Secondary | ICD-10-CM

## 2014-02-13 DIAGNOSIS — M79609 Pain in unspecified limb: Secondary | ICD-10-CM

## 2014-02-13 DIAGNOSIS — E1149 Type 2 diabetes mellitus with other diabetic neurological complication: Secondary | ICD-10-CM

## 2014-02-13 DIAGNOSIS — Q828 Other specified congenital malformations of skin: Secondary | ICD-10-CM

## 2014-02-13 DIAGNOSIS — E1142 Type 2 diabetes mellitus with diabetic polyneuropathy: Secondary | ICD-10-CM

## 2014-02-13 DIAGNOSIS — L608 Other nail disorders: Secondary | ICD-10-CM

## 2014-02-13 DIAGNOSIS — M79673 Pain in unspecified foot: Secondary | ICD-10-CM

## 2014-02-13 NOTE — Progress Notes (Signed)
   Subjective:    Patient ID: Angela Pugh, female    DOB: April 21, 1946, 67 y.o.   MRN: [id number]  HPI Comments: "Cut the toenails and the calluses"  Calluses - plantar forefoot bialteral and tip of 1st toes bilateral     Review of Systems no new findings or systemic changes noted     Objective:   Physical Exam Patient presents this time for followup diabetic foot and nail care as well as multiple porokeratosis or verrucoid lesions hallux IP joint of sub-second bilateral inferior heel area right multiple lesions are identified pain from the keratotic lesions return identified lesions under the subungual to right are debrided down to a pinpoint bleeding which is treated with lumicain and Neosporin and Band-Aid. Does have diabetes with history of peripheral compromise DP +2/4 PT one over 4 bilateral capillary refill time 3 seconds epicritic and proprioceptive sensations intact although diminished on Semmes Weinstein to forefoot digits nails thick brittle criptotic incurvated friable 1 through 5 bilateral no infections no open wounds the ulcers no secondary infections patient wearing diabetic shoes these replacement shoes at this time patient does have atrophy of fat pad multiple keratotic lesions daily keratoses of fat pad atrophy.       Assessment & Plan:  Assessment diabetes with history peripheral neuropathy and complications dystrophic friable friable criptotic nails debrided x10 also debridement multiple keratoses will schedule followup and authorization for diabetic accident shoes and custom molded inlays also of followup in the future for palliative nail into callus care when needed applied and dispensing tube foam pad for the left great toe as well  Harriet Masson DPM

## 2014-02-13 NOTE — Patient Instructions (Signed)
Diabetes and Foot Care Diabetes may cause you to have problems because of poor blood supply (circulation) to your feet and legs. This may cause the skin on your feet to become thinner, break easier, and heal more slowly. Your skin may become dry, and the skin may peel and crack. You may also have nerve damage in your legs and feet causing decreased feeling in them. You may not notice minor injuries to your feet that could lead to infections or more serious problems. Taking care of your feet is one of the most important things you can do for yourself.  HOME CARE INSTRUCTIONS  Wear shoes at all times, even in the house. Do not go barefoot. Bare feet are easily injured.  Check your feet daily for blisters, cuts, and redness. If you cannot see the bottom of your feet, use a mirror or ask someone for help.  Wash your feet with warm water (do not use hot water) and mild soap. Then pat your feet and the areas between your toes until they are completely dry. Do not soak your feet as this can dry your skin.  Apply a moisturizing lotion or petroleum jelly (that does not contain alcohol and is unscented) to the skin on your feet and to dry, brittle toenails. Do not apply lotion between your toes.  Trim your toenails straight across. Do not dig under them or around the cuticle. File the edges of your nails with an emery board or nail file.  Do not cut corns or calluses or try to remove them with medicine.  Wear clean socks or stockings every day. Make sure they are not too tight. Do not wear knee-high stockings since they may decrease blood flow to your legs.  Wear shoes that fit properly and have enough cushioning. To break in new shoes, wear them for just a few hours a day. This prevents you from injuring your feet. Always look in your shoes before you put them on to be sure there are no objects inside.  Do not cross your legs. This may decrease the blood flow to your feet.  If you find a minor scrape,  cut, or break in the skin on your feet, keep it and the skin around it clean and dry. These areas may be cleansed with mild soap and water. Do not cleanse the area with peroxide, alcohol, or iodine.  When you remove an adhesive bandage, be sure not to damage the skin around it.  If you have a wound, look at it several times a day to make sure it is healing.  Do not use heating pads or hot water bottles. They may burn your skin. If you have lost feeling in your feet or legs, you may not know it is happening until it is too late.  Make sure your health care provider performs a complete foot exam at least annually or more often if you have foot problems. Report any cuts, sores, or bruises to your health care provider immediately. SEEK MEDICAL CARE IF:   You have an injury that is not healing.  You have cuts or breaks in the skin.  You have an ingrown nail.  You notice redness on your legs or feet.  You feel burning or tingling in your legs or feet.  You have pain or cramps in your legs and feet.  Your legs or feet are numb.  Your feet always feel cold. SEEK IMMEDIATE MEDICAL CARE IF:   There is increasing redness,   swelling, or pain in or around a wound.  There is a red line that goes up your leg.  Pus is coming from a wound.  You develop a fever or as directed by your health care provider.  You notice a bad smell coming from an ulcer or wound. Document Released: 05/01/2000 Document Revised: 01/04/2013 Document Reviewed: 10/11/2012 Exit[employer] Patient Information 2015 [employer]. This information is not intended to replace advice given to you by your health care provider. Make sure you discuss any questions you have with your health care provider.  

## 2014-02-26 ENCOUNTER — Telehealth: Payer: Self-pay | Admitting: *Deleted

## 2014-02-26 NOTE — Telephone Encounter (Signed)
Calling to see if you all will fax over a form for Diabetic shoes to Dr. Montez Morita.  Put attention to [facility] on it please, thank you.

## 2014-02-27 NOTE — Telephone Encounter (Signed)
I attempted to return her call.  I asked her to call me back.  I had difficulty understanding what she was saying in her message.

## 2014-03-06 NOTE — Telephone Encounter (Signed)
I returned her call.  I asked how I could help her.  She stated, "Apparently you all have already taken care of it.  I talked to Dr. Montez Morita today and he said he got the forms and would take care of it today.  Thanks for returning my call."

## 2014-04-20 ENCOUNTER — Ambulatory Visit: Payer: Medicare Other

## 2014-05-27 IMAGING — RF DG ESOPHAGUS
8 of 10 series · 19 of 24 positions shown · non-contrast
Comparison: None.

CLINICAL DATA: Dysphagia.  Abdominal pain.

EXAM:
ESOPHOGRAM / BARIUM SWALLOW / BARIUM TABLET STUDY
TECHNIQUE: Combined double contrast and single contrast examination performed
using effervescent crystals, thick barium liquid, and thin barium
liquid. The patient was observed with fluoroscopy swallowing a 13mm
barium sulphate tablet.
FLUOROSCOPY TIME:  0 min 54 seconds

[Series 1: run · 9 of 20 slices shown (1 of 8)]
[im 1/20]
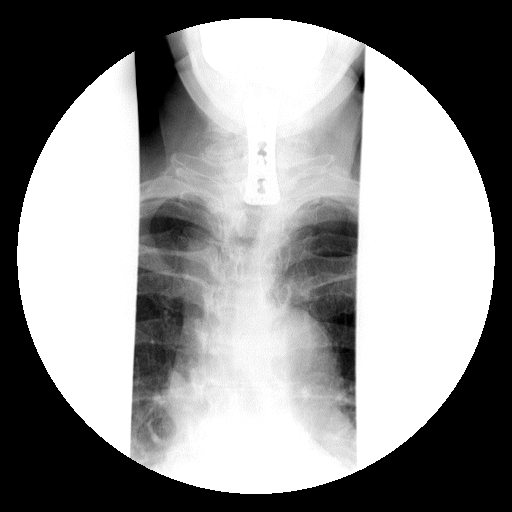
[im 2/20]
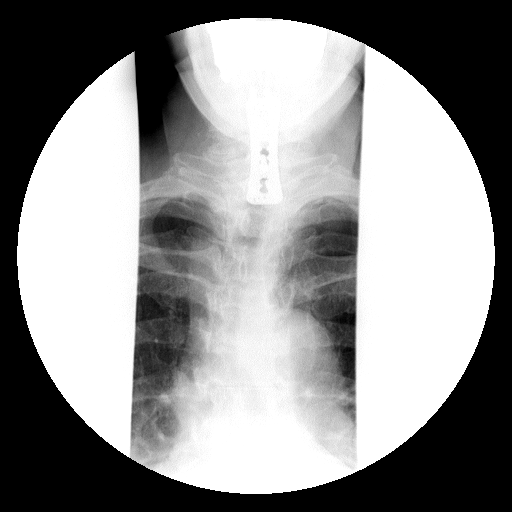
[im 6/20]
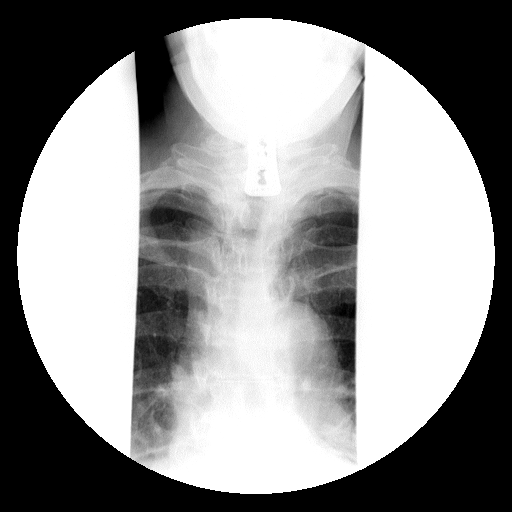
[im 8/20]
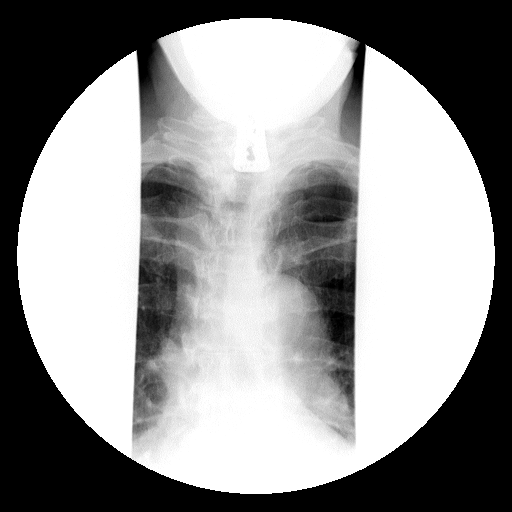
[im 10/20]
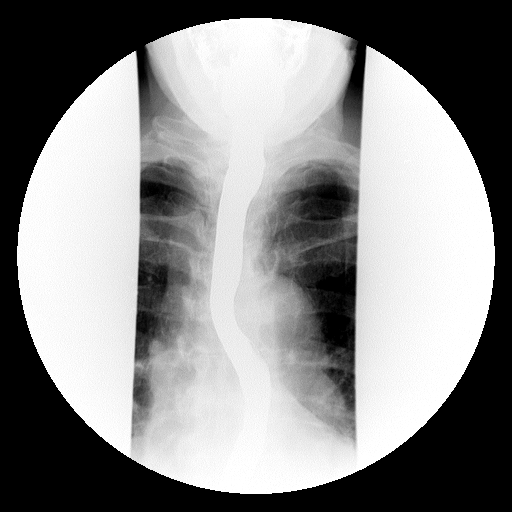
[im 12/20]
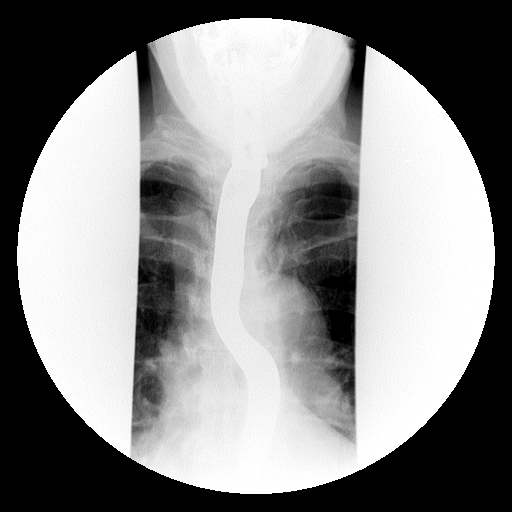
[im 16/20]
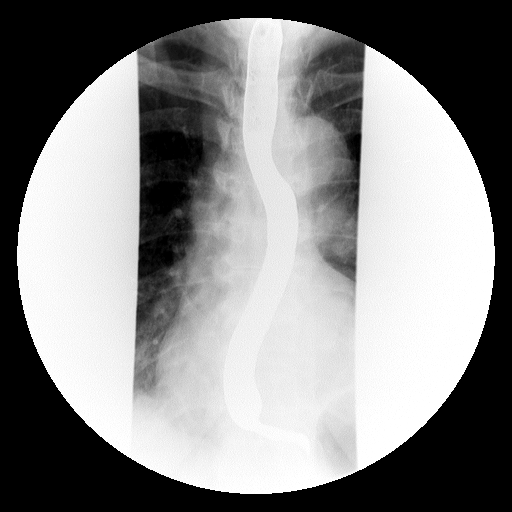
[im 18/20]
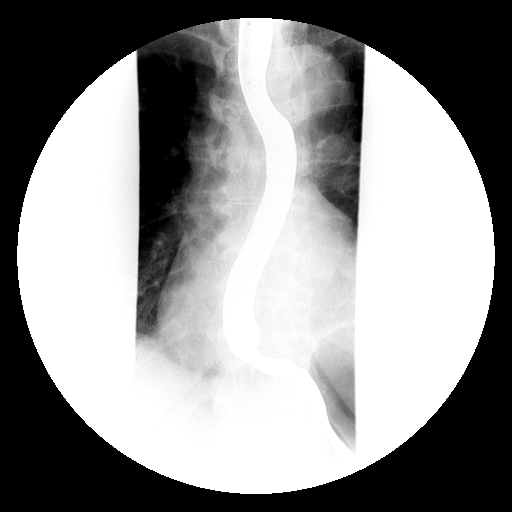
[im 20/20]
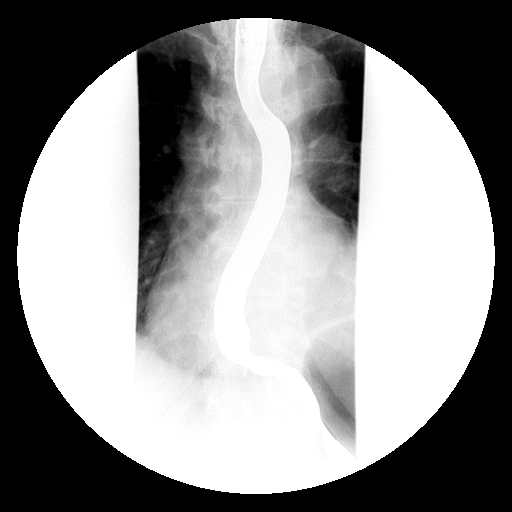

[Series 2: run · 4 of 11 slices shown (2 of 8)]
[im 3/11]
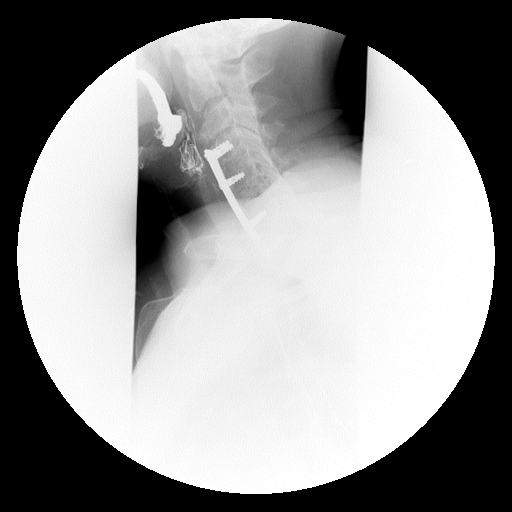
[im 6/11]
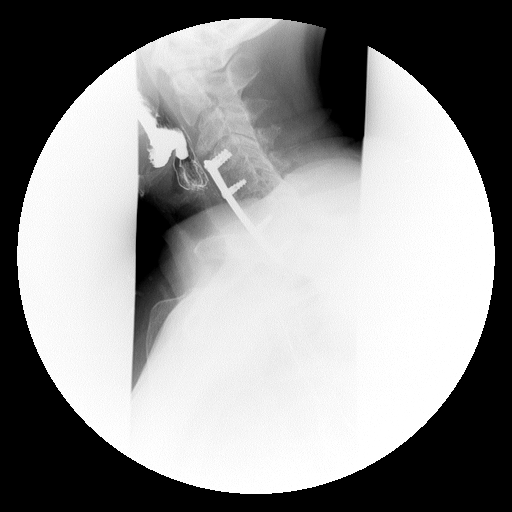
[im 8/11]
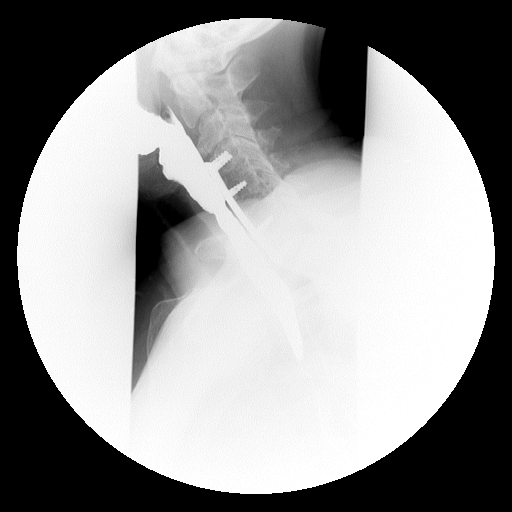
[im 11/11]
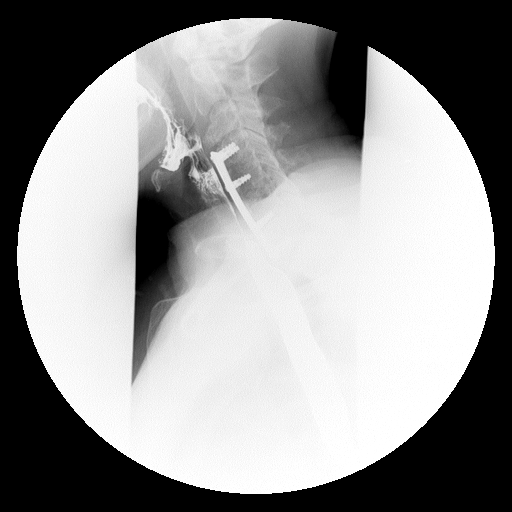

[Series 4: run · 1 of 1 slices shown (3 of 8)]
[im 1/1]
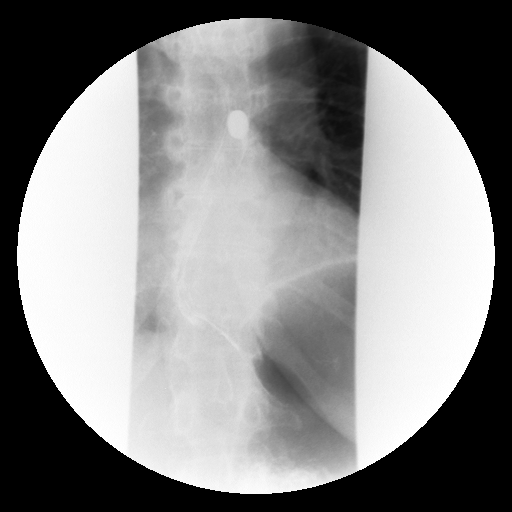

[Series 5: run · 1 of 1 slices shown (4 of 8)]
[im 1/1]
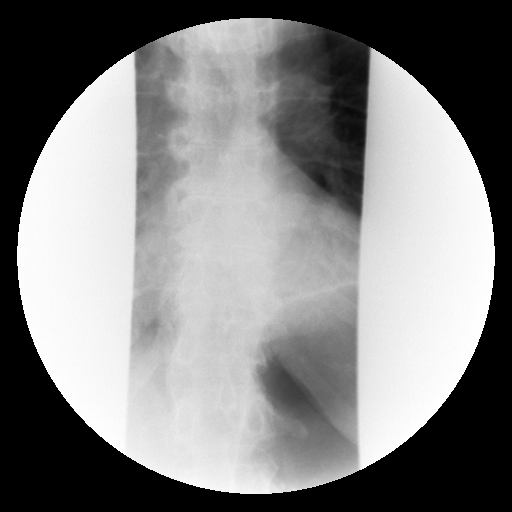

[Series 6: run · 1 of 1 slices shown (5 of 8)]
[im 1/1]
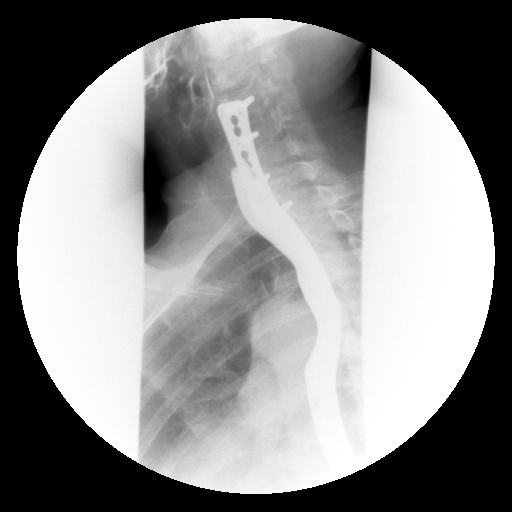

[Series 7: run · 1 of 1 slices shown (6 of 8)]
[im 1/1]
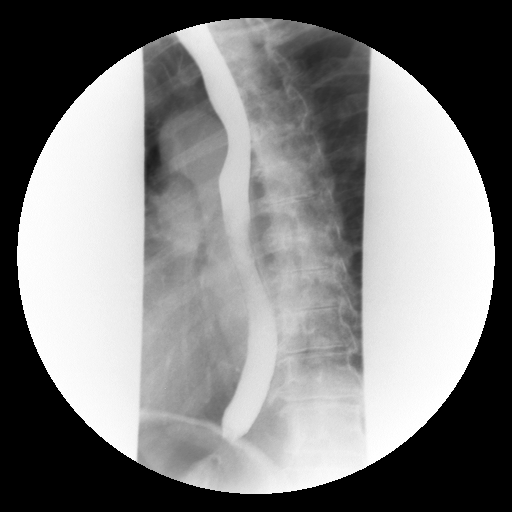

[Series 9: run · 1 of 1 slices shown (7 of 8)]
[im 1/1]
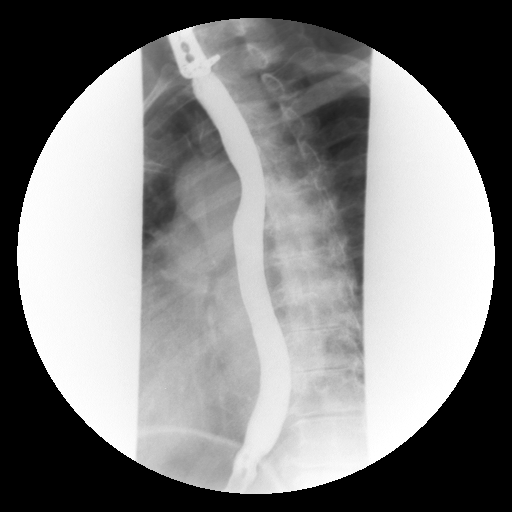

[Series 10: run · 1 of 1 slices shown (8 of 8)]
[im 1/1]
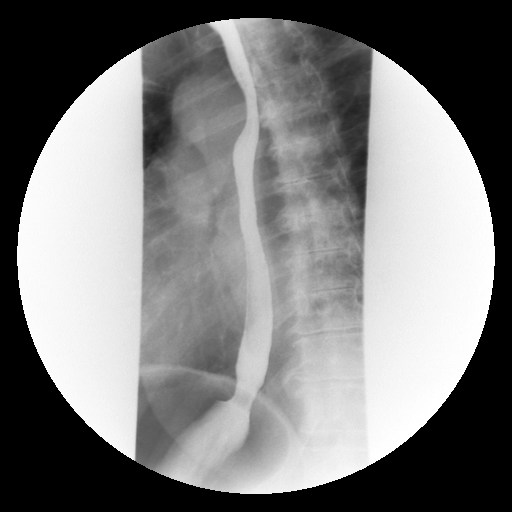

[19 of 24 positions shown; findings below may reference images not displayed]

FINDINGS: The oropharyngeal swallowing mechanisms are normal. The mucosa and
motility of the esophagus are normal. There is no hiatal hernia or
gastroesophageal reflux. A 13 mm barium tablet passed immediately
from the mouth to the stomach with no delay.
IMPRESSION: Normal barium esophagram.

## 2014-05-29 ENCOUNTER — Ambulatory Visit (INDEPENDENT_AMBULATORY_CARE_PROVIDER_SITE_OTHER): Payer: Medicare Other

## 2014-05-29 ENCOUNTER — Telehealth: Payer: Self-pay | Admitting: *Deleted

## 2014-05-29 DIAGNOSIS — M79673 Pain in unspecified foot: Secondary | ICD-10-CM | POA: Diagnosis not present

## 2014-05-29 DIAGNOSIS — E1140 Type 2 diabetes mellitus with diabetic neuropathy, unspecified: Secondary | ICD-10-CM

## 2014-05-29 DIAGNOSIS — B351 Tinea unguium: Secondary | ICD-10-CM | POA: Diagnosis not present

## 2014-05-29 DIAGNOSIS — M158 Other polyosteoarthritis: Secondary | ICD-10-CM

## 2014-05-29 DIAGNOSIS — M2040 Other hammer toe(s) (acquired), unspecified foot: Secondary | ICD-10-CM

## 2014-05-29 DIAGNOSIS — Q828 Other specified congenital malformations of skin: Secondary | ICD-10-CM

## 2014-05-29 NOTE — Telephone Encounter (Addendum)
Pt states there are changes on her medication list that was printed today.  I left a message to inform pt she could bring a list at her next visit or call changes to the voicemail.  Pt called again states she only takes the Welchol, Losartan HCTZ, and Norvasc.  I changed these in the pt's 05/29/2014 record.

## 2014-05-29 NOTE — Progress Notes (Signed)
   Subjective:    Patient ID: Angela Pugh, female    DOB: 1946-04-25, 69 y.o.   MRN: [id number]  HPI  Pt presents for nail debridement  Review of Systems no new findings or systemic changes noted     Objective:   Physical Exam Vascular status is intact and unchanged pedal pulses palpable DP +2 PT plus one over 4 Refill time 3 seconds all digits epicritic and proprioceptive sensations decreased to the forefoot digits and arch. Multiple thick brittle dystrophic from criptotic nails are debrided 10 also keratoses subsecond bilateral debrided the keratotic lesions and pinch callus first bilateral debrided. As one pair of diabetic extra shoes 3 pairs of dual density Plastizote inlays shoes in lace fit and contour well with full contact the patient's foot and arch bilateral.       Assessment & Plan:  Assessment diabetes with peripheral neuropathy and angiopathy complications painful mycotic nails debrided 10 keratoses are debrided as well bilateral this time dispensed diabetic shoes and custom molded insoles shoes and insoles fit and contour well to the foot written instructions for shoewear break in are given follow-up in 3 months for continued palliative care in the future as needed next  Harriet Masson DPM

## 2014-05-30 NOTE — Addendum Note (Signed)
Addended by: Harriett Sine D on: 05/30/2014 01:39 PM   Modules accepted: Orders, Medications

## 2014-09-11 ENCOUNTER — Ambulatory Visit (INDEPENDENT_AMBULATORY_CARE_PROVIDER_SITE_OTHER): Payer: Medicare Other

## 2014-09-11 DIAGNOSIS — E1140 Type 2 diabetes mellitus with diabetic neuropathy, unspecified: Secondary | ICD-10-CM

## 2014-09-11 DIAGNOSIS — B351 Tinea unguium: Secondary | ICD-10-CM

## 2014-09-11 DIAGNOSIS — M79673 Pain in unspecified foot: Secondary | ICD-10-CM

## 2014-09-14 NOTE — Progress Notes (Signed)
HPI Presents today chief complaint of painful elongated toenails.  Objective: Pulses are palpable bilateral nails are thick, yellow dystrophic onychomycosis and painful palpation.   Assessment: Onychomycosis with pain in limb.  Plan: Treatment of nails in thickness and length as covered service secondary to pain.  

## 2014-12-11 ENCOUNTER — Encounter: Payer: Self-pay | Admitting: Podiatry

## 2014-12-11 ENCOUNTER — Ambulatory Visit (INDEPENDENT_AMBULATORY_CARE_PROVIDER_SITE_OTHER): Payer: Medicare Other | Admitting: Podiatry

## 2014-12-11 DIAGNOSIS — Q828 Other specified congenital malformations of skin: Secondary | ICD-10-CM | POA: Diagnosis not present

## 2014-12-11 DIAGNOSIS — M79673 Pain in unspecified foot: Secondary | ICD-10-CM

## 2014-12-11 DIAGNOSIS — B351 Tinea unguium: Secondary | ICD-10-CM

## 2014-12-11 NOTE — Progress Notes (Signed)
Patient ID: Angela Pugh, female   DOB: Apr 27, 1946, 69 y.o.   MRN: [id number] Complaint:  Visit Type: Patient returns to my office for continued preventative foot care services. Complaint: Patient states" my nails have grown long and thick and become painful to walk and wear shoes" . The patient presents for preventative foot care services. No changes to ROS.  Patient has multiple porokeratosis B/l  Podiatric Exam: Vascular: dorsalis pedis and posterior tibial pulses are palpable bilateral. Capillary return is immediate. Temperature gradient is WNL. Skin turgor WNL  Sensorium: Normal Semmes Weinstein monofilament test. Normal tactile sensation bilaterally. Nail Exam: Pt has thick disfigured discolored nails with subungual debris noted bilateral entire nail hallux through fifth toenails Ulcer Exam: There is no evidence of ulcer or pre-ulcerative changes or infection. Orthopedic Exam: Muscle tone and strength are WNL. No limitations in general ROM. No crepitus or effusions noted. Foot type and digits show no abnormalities. Bony prominences are unremarkable. Skin:  Porokeratosis sub 3 B/L and hallux B/L. No infection or ulcers  Diagnosis:  Onychomycosis, , Pain in right toe, pain in left toes, Porokeratosis  Treatment & Plan Procedures and Treatment: Consent by patient was obtained for treatment procedures. The patient understood the discussion of treatment and procedures well. All questions were answered thoroughly reviewed. Debridement of mycotic and hypertrophic toenails, 1 through 5 bilateral and clearing of subungual debris. No ulceration, no infection noted. Debride porokeratosis. Return Visit-Office Procedure: Patient instructed to return to the office for a follow up visit 3 months for continued evaluation and treatment.

## 2015-01-16 ENCOUNTER — Encounter (HOSPITAL_COMMUNITY): Payer: Self-pay | Admitting: *Deleted

## 2015-01-16 ENCOUNTER — Emergency Department (HOSPITAL_COMMUNITY)
Admission: EM | Admit: 2015-01-16 | Discharge: 2015-01-17 | Disposition: A | Discharge status: Home / Self Care | Payer: Medicare Other | Attending: Emergency Medicine | Admitting: Emergency Medicine

## 2015-01-16 ENCOUNTER — Emergency Department (HOSPITAL_COMMUNITY): Payer: Medicare Other

## 2015-01-16 DIAGNOSIS — Z8709 Personal history of other diseases of the respiratory system: Secondary | ICD-10-CM | POA: Insufficient documentation

## 2015-01-16 DIAGNOSIS — Z79899 Other long term (current) drug therapy: Secondary | ICD-10-CM | POA: Diagnosis not present

## 2015-01-16 DIAGNOSIS — I10 Essential (primary) hypertension: Secondary | ICD-10-CM | POA: Insufficient documentation

## 2015-01-16 DIAGNOSIS — Z8739 Personal history of other diseases of the musculoskeletal system and connective tissue: Secondary | ICD-10-CM | POA: Insufficient documentation

## 2015-01-16 DIAGNOSIS — Z87891 Personal history of nicotine dependence: Secondary | ICD-10-CM | POA: Diagnosis not present

## 2015-01-16 DIAGNOSIS — E119 Type 2 diabetes mellitus without complications: Secondary | ICD-10-CM | POA: Diagnosis not present

## 2015-01-16 DIAGNOSIS — H8110 Benign paroxysmal vertigo, unspecified ear: Secondary | ICD-10-CM

## 2015-01-16 DIAGNOSIS — R42 Dizziness and giddiness: Secondary | ICD-10-CM | POA: Diagnosis present

## 2015-01-16 LAB — COMPREHENSIVE METABOLIC PANEL
ALBUMIN: 3.8 g/dL (ref 3.5–5.0)
ALT: 12 U/L — AB (ref 14–54)
AST: 18 U/L (ref 15–41)
Alkaline Phosphatase: 65 U/L (ref 38–126)
Anion gap: 11 (ref 5–15)
BILIRUBIN TOTAL: 0.7 mg/dL (ref 0.3–1.2)
BUN: 15 mg/dL (ref 6–20)
CO2: 24 mmol/L (ref 22–32)
Calcium: 9.7 mg/dL (ref 8.9–10.3)
Chloride: 106 mmol/L (ref 101–111)
Creatinine, Ser: 1.04 mg/dL — ABNORMAL HIGH (ref 0.44–1.00)
GFR calc Af Amer: >60 mL/min (ref >60)
GFR calc non Af Amer: 54 mL/min — ABNORMAL LOW (ref >60)
GLUCOSE: 133 mg/dL — AB (ref 65–99)
POTASSIUM: 3.6 mmol/L (ref 3.5–5.1)
Sodium: 141 mmol/L (ref 135–145)
TOTAL PROTEIN: 7.3 g/dL (ref 6.5–8.1)

## 2015-01-16 LAB — DIFFERENTIAL
BASOS ABS: 0 10*3/uL (ref 0.0–0.1)
Basophils Relative: 0 % (ref 0–1)
EOS ABS: 0.1 10*3/uL (ref 0.0–0.7)
Eosinophils Relative: 1 % (ref 0–5)
LYMPHS ABS: 3.2 10*3/uL (ref 0.7–4.0)
LYMPHS PCT: 41 % (ref 12–46)
Monocytes Absolute: 0.6 10*3/uL (ref 0.1–1.0)
Monocytes Relative: 8 % (ref 3–12)
NEUTROS ABS: 3.9 10*3/uL (ref 1.7–7.7)
NEUTROS PCT: 50 % (ref 43–77)

## 2015-01-16 LAB — I-STAT TROPONIN, ED: Troponin i, poc: 0 ng/mL (ref 0.00–0.08)

## 2015-01-16 LAB — CBC
HCT: 37.1 % (ref 36.0–46.0)
HEMOGLOBIN: 12.8 g/dL (ref 12.0–15.0)
MCH: 32.2 pg (ref 26.0–34.0)
MCHC: 34.5 g/dL (ref 30.0–36.0)
MCV: 93.5 fL (ref 78.0–100.0)
Platelets: 290 10*3/uL (ref 150–400)
RBC: 3.97 MIL/uL (ref 3.87–5.11)
RDW: 13.1 % (ref 11.5–15.5)
WBC: 7.8 10*3/uL (ref 4.0–10.5)

## 2015-01-16 LAB — I-STAT CHEM 8, ED
BUN: 17 mg/dL (ref 6–20)
CREATININE: 1 mg/dL (ref 0.44–1.00)
Calcium, Ion: 1.08 mmol/L — ABNORMAL LOW (ref 1.13–1.30)
Chloride: 104 mmol/L (ref 101–111)
Glucose, Bld: 129 mg/dL — ABNORMAL HIGH (ref 65–99)
HEMATOCRIT: 39 % (ref 36.0–46.0)
Hemoglobin: 13.3 g/dL (ref 12.0–15.0)
POTASSIUM: 3.5 mmol/L (ref 3.5–5.1)
Sodium: 141 mmol/L (ref 135–145)
TCO2: 22 mmol/L (ref 0–100)

## 2015-01-16 LAB — PROTIME-INR
INR: 1.06 (ref 0.00–1.49)
Prothrombin Time: 14 seconds (ref 11.6–15.2)

## 2015-01-16 LAB — APTT: APTT: 36 s (ref 24–37)

## 2015-01-16 MED ORDER — MECLIZINE HCL 25 MG PO TABS
50.0000 mg | ORAL_TABLET | Freq: Once | ORAL | Status: AC
  Administered 2015-01-16: 50 mg via ORAL
  Filled 2015-01-16: qty 2

## 2015-01-16 NOTE — ED Notes (Signed)
Patient presents with c/o dizziness that started about 6pm  Patient unable to sit still in the chair and feels like she is going to vomit

## 2015-01-16 NOTE — ED Provider Notes (Signed)
CSN: [id number]     Arrival date & time 01/16/15  2238 History   First MD Initiated Contact with Patient 01/16/15 2253     Chief Complaint  Patient presents with  . Dizziness  . Emesis     (Consider location/radiation/quality/duration/timing/severity/associated sxs/prior Treatment) HPI Comments: Angela Pugh is a 69 y.o F with a pmhx of HTN, DM who presents today c/o sudden onset dizziness that began around 6pm. Pt states that she was resting on the couch when she suddenly felt very dizzy and was unable to "get it to go away". Pt is subsequently feeling very nauseous but no vomiting. No known history of stroke. Denies weakness, numbness, tingling, speech difficulty, gait disturbance, change in vision, CP, SOB, syncope. Pt decided to come to the ER because the dizziness wouldn't go away. Dizziness is worsened with movement of head and change of positions. No ear pain, hearing loss, recent illness, abdominal pain.   Patient is a 69 y.o. female presenting with dizziness and vomiting. The history is provided by the patient.  Dizziness Associated symptoms: vomiting   Emesis   Past Medical History  Diagnosis Date  . Hypertension   . Sinus complaint   . Diabetes mellitus     diet controlled, no meds now, was on insulin & po treatment at one time    . Headache(784.0)     occas. relative to sinuses   . Arthritis     back, knees, shoulder, hands , injections in pelvis, for bone spurs   Past Surgical History  Procedure Laterality Date  . Abdominal hysterectomy    . Bone spurs      removed  . Anterior fusion cervical spine  2000  . Lumbar laminectomy Left 06/30/2013    Procedure: Left L3-4 Extraforaminal approach to excise far lateral herniated nucleus pulposus;  Surgeon: Jessy Oto, MD;  [facility];  Service: Orthopedics;  Laterality: Left;   No family history on file. Social History  Substance Use Topics  . Smoking status: Former Research scientist (life sciences)  . Smokeless tobacco: Former Systems developer     Quit date: 06/30/1982  . Alcohol Use: Yes     Comment: ocass. - social    OB History    No data available     Review of Systems  Gastrointestinal: Positive for vomiting.  Neurological: Positive for dizziness.  All other systems reviewed and are negative.     Allergies  Acetaminophen  Home Medications   Prior to Admission medications   Medication Sig Start Date End Date Taking? Authorizing Provider  amLODipine (NORVASC) 5 MG tablet Take 5 mg by mouth 2 (two) times daily.   Yes Historical Provider, MD  colesevelam (WELCHOL) 625 MG tablet Take by mouth 2 (two) times daily with a meal.   Yes Historical Provider, MD  losartan-hydrochlorothiazide (HYZAAR) 100-25 MG per tablet Take 1 tablet by mouth daily.  04/04/13  Yes Historical Provider, MD   BP 140/73 mmHg  Pulse 63  Temp(Src) 97.6 F (36.4 C) (Oral)  Resp 20  SpO2 100% Physical Exam  Constitutional: She is oriented to person, place, and time. She appears well-developed and well-nourished. No distress.  HENT:  Head: Normocephalic and atraumatic.  Mouth/Throat: Oropharynx is clear and moist. No oropharyngeal exudate.  Eyes: Conjunctivae and EOM are normal. Pupils are equal, round, and reactive to light. Right eye exhibits no discharge. Left eye exhibits no discharge. No scleral icterus.  No nystagmus.   Neck: Neck supple. No JVD present.  Cardiovascular: Normal rate, regular  rhythm, normal heart sounds and intact distal pulses.  Exam reveals no gallop and no friction rub.   No murmur heard. Pulmonary/Chest: Effort normal and breath sounds normal. No respiratory distress. She has no wheezes. She has no rales. She exhibits no tenderness.  Abdominal: Soft. She exhibits no distension. There is no tenderness. There is no rebound and no guarding.  Musculoskeletal: Normal range of motion. She exhibits no edema or tenderness.  Lymphadenopathy:    She has no cervical adenopathy.  Neurological: She is alert and oriented to person,  place, and time. No cranial nerve deficit.  Strength 5/5 in all 4 extremities. No sensory deficits. Negative Romberg. Negative finger to nose.     Skin: Skin is warm and dry. No rash noted. She is not diaphoretic. No erythema. No pallor.  Psychiatric: She has a normal mood and affect. Her behavior is normal.  Nursing note and vitals reviewed.   ED Course  Procedures (including critical care time)  Pt sent for CT  Pt given meclizine. Pt reports complete resolution of symptoms after taking meclizine.    Labs Review Labs Reviewed  COMPREHENSIVE METABOLIC PANEL - Abnormal; Notable for the following:    Glucose, Bld 133 (*)    Creatinine, Ser 1.04 (*)    ALT 12 (*)    GFR calc non Af Amer 54 (*)    All other components within normal limits  CBG MONITORING, ED - Abnormal; Notable for the following:    Glucose-Capillary 139 (*)    All other components within normal limits  I-STAT CHEM 8, ED - Abnormal; Notable for the following:    Glucose, Bld 129 (*)    Calcium, Ion 1.08 (*)    All other components within normal limits  URINE CULTURE  PROTIME-INR  APTT  CBC  DIFFERENTIAL  URINALYSIS, ROUTINE W REFLEX MICROSCOPIC (NOT AT [facility])  I-STAT TROPOININ, ED    Imaging Review Ct Head Wo Contrast  01/16/2015   CLINICAL DATA:  Severe vertigo.  Onset 18:00.  EXAM: CT HEAD WITHOUT CONTRAST  TECHNIQUE: Contiguous axial images were obtained from the base of the skull through the vertex without intravenous contrast.  COMPARISON:  10/24/2003.  FINDINGS: There is no intracranial hemorrhage, mass or evidence of acute infarction. There is no extra-axial fluid collection. Gray matter and white matter appear normal. Cerebral volume is normal for age. Brainstem and posterior fossa are unremarkable. The CSF spaces appear normal.  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 This does not appear significantly different from 2005. Visible paranasal sinuses  are clear.  IMPRESSION: 1. Normal brain 2. 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   Electronically Signed   By: Andreas Newport M.D.   On: 01/16/2015 23:35   I have personally reviewed and evaluated these images and lab results as part of my medical decision-making.   EKG Interpretation   Date/Time:  Wednesday January 16 2015 22:50:01 EDT Ventricular Rate:  65 PR Interval:  204 QRS Duration: 150 QT Interval:  474 QTC Calculation: 492 R Axis:   65 Text Interpretation:  Normal sinus rhythm Left bundle branch block  Abnormal ECG Sinus rhythm Left bundle branch block , new since 2001  Artifact Abnormal ekg Confirmed by Carmin Muskrat  MD [phone]) on  01/16/2015 10:53:35 PM      MDM   Final diagnoses:  BPPV (benign paroxysmal positional vertigo), unspecified laterality    Pt see for sudden onset  dizziness, symptoms began at 6 pm. CT head negative for hemorrhage. Stroke unlikely. No focal neurological deficits. No neurological complaints. Dizziness worsened with movement. Likely due to BPPV. Pt improved with meclizine.   Patient was discussed with and seen by Dr. Vanita Panda who agrees with the treatment plan.     Dondra Spry Solomon, PA-C 01/17/15 0047  Carmin Muskrat, MD 01/18/15 Berniece Salines

## 2015-01-17 DIAGNOSIS — H8110 Benign paroxysmal vertigo, unspecified ear: Secondary | ICD-10-CM | POA: Diagnosis not present

## 2015-01-17 LAB — URINALYSIS, ROUTINE W REFLEX MICROSCOPIC
Bilirubin Urine: NEGATIVE
Glucose, UA: NEGATIVE mg/dL
Hgb urine dipstick: NEGATIVE
Ketones, ur: NEGATIVE mg/dL
Leukocytes, UA: NEGATIVE
Nitrite: NEGATIVE
Protein, ur: NEGATIVE mg/dL
Specific Gravity, Urine: 1.016 (ref 1.005–1.030)
Urobilinogen, UA: 1 mg/dL (ref 0.0–1.0)
pH: 7.5 (ref 5.0–8.0)

## 2015-01-17 LAB — CBG MONITORING, ED: Glucose-Capillary: 139 mg/dL — ABNORMAL HIGH (ref 65–99)

## 2015-01-17 MED ORDER — MECLIZINE HCL 50 MG PO TABS: 50.0000 mg | ORAL_TABLET | Freq: Three times a day (TID) | ORAL | Status: DC | PRN

## 2015-01-17 NOTE — Discharge Instructions (Signed)
Benign Positional Vertigo Vertigo means you feel like you or your surroundings are moving when they are not. Benign positional vertigo is the most common form of vertigo. Benign means that the cause of your condition is not serious. Benign positional vertigo is more common in older adults. CAUSES  Benign positional vertigo is the result of an upset in the labyrinth system. This is an area in the middle ear that helps control your balance. This may be caused by a viral infection, head injury, or repetitive motion. However, often no specific cause is found. SYMPTOMS  Symptoms of benign positional vertigo occur when you move your head or eyes in different directions. Some of the symptoms may include:  Loss of balance and falls.  Vomiting.  Blurred vision.  Dizziness.  Nausea.  Involuntary eye movements (nystagmus). DIAGNOSIS  Benign positional vertigo is usually diagnosed by physical exam. If the specific cause of your benign positional vertigo is unknown, your caregiver may perform imaging tests, such as magnetic resonance imaging (MRI) or computed tomography (CT). TREATMENT  Your caregiver may recommend movements or procedures to correct the benign positional vertigo. Medicines such as meclizine, benzodiazepines, and medicines for nausea may be used to treat your symptoms. In rare cases, if your symptoms are caused by certain conditions that affect the inner ear, you may need surgery. HOME CARE INSTRUCTIONS   Follow your caregiver's instructions.  Move slowly. Do not make sudden body or head movements.  Avoid driving.  Avoid operating heavy machinery.  Avoid performing any tasks that would be dangerous to you or others during a vertigo episode.  Drink enough fluids to keep your urine clear or pale yellow. SEEK IMMEDIATE MEDICAL CARE IF:   You develop problems with walking, weakness, numbness, or using your arms, hands, or legs.  You have difficulty speaking.  You develop  severe headaches.  Your nausea or vomiting continues or gets worse.  You develop visual changes.  Your family or friends notice any behavioral changes.  Your condition gets worse.  You have a fever.  You develop a stiff neck or sensitivity to light. MAKE SURE YOU:   Understand these instructions.  Will watch your condition.  Will get help right away if you are not doing well or get worse. Document Released: 02/09/2006 Document Revised: 07/27/2011 Document Reviewed: 01/22/2011 [facility] Patient Information 2015 [place], [place]. This information is not intended to replace advice given to you by your health care provider. Make sure you discuss any questions you have with your health care provider.  Follow up with neurology in 1 week for reevaluation of BPPV. Return if symptoms worsen or do not improve with meclizine. Return if vomiting, fevers, chills occur.

## 2015-01-17 NOTE — ED Notes (Signed)
CBG is 139.

## 2015-01-17 NOTE — ED Notes (Signed)
Pt left with all belongings and ambulated out of treatment area with daughter.

## 2015-01-18 LAB — URINE CULTURE: Culture: NO GROWTH

## 2015-01-22 ENCOUNTER — Encounter: Payer: Self-pay | Admitting: Neurology

## 2015-01-22 ENCOUNTER — Ambulatory Visit (INDEPENDENT_AMBULATORY_CARE_PROVIDER_SITE_OTHER): Payer: Medicare Other | Admitting: Neurology

## 2015-01-22 DIAGNOSIS — R42 Dizziness and giddiness: Secondary | ICD-10-CM

## 2015-01-22 DIAGNOSIS — M545 Low back pain, unspecified: Secondary | ICD-10-CM

## 2015-01-22 NOTE — Progress Notes (Signed)
PATIENT: Angela Pugh DOB: 15-Jul-1945  Chief Complaint  Patient presents with  . Dizziness    Orthostatic Vitals: Lying, Sitting, Standing.  She had a severe episode of dizziness and weakness last week that lasted 6-7 hours.  She was given meclizine which was helpful but made her too sleepy.  She has not had any further events.       HISTORICAL  Angela Pugh is a 69 years old right-handed female, seen in refer by her primary care physician Dr. Wallene Huh  For evaluation of dizziness  She had a past medical history of hypertension hyperlipidemia, since August 2016, she had a mild episode of transient dizziness induced by sudden positional movement.  In August 30 first 2016, around 6 PM, she went to her yard, when bending over, she had sudden onset vertigo, nausea, gait difficulty, blurry vision, she was brought by ambulance to the emergency room, I have personally reviewed CAT scan of the brain showed no acute lesions, CBC CMP was normal symptoms last about 6 hours, gradually subsided after she take 2 doses of meclizine 50 mg  Even now, she complains of mild wooziness, transient dizziness with sudden positional change, she denied tinnitus, no hearing loss, no longer has gait difficulty  Over past 1 week, since early September 2016, she also noticed worsening right low back pain, radiating along right hip, no skin rash broke out, no left leg involvement,   REVIEW OF SYSTEMS: Full 14 system review of systems performed and notable only for fever, chill, weight loss, fatigue, blurred vision, spinning sensation, shortness of breath, snoring, feeling hot, feeling cold, increased thirst, joint pain, swelling, achy muscles, weakness, dizziness, snoring, change in appetite  ALLERGIES: Allergies  Allergen Reactions  . Acetaminophen Hives and Nausea Only    HOME MEDICATIONS: Current Outpatient Prescriptions  Medication Sig Dispense Refill  . amLODipine (NORVASC) 5 MG tablet Take 5  mg by mouth 2 (two) times daily.    . colesevelam (WELCHOL) 625 MG tablet Take by mouth 2 (two) times daily with a meal.    . losartan-hydrochlorothiazide (HYZAAR) 100-25 MG per tablet Take 1 tablet by mouth daily.     . meclizine (ANTIVERT) 50 MG tablet Take 1 tablet (50 mg total) by mouth 3 (three) times daily as needed. 30 tablet 0   No current facility-administered medications for this visit.    PAST MEDICAL HISTORY: Past Medical History  Diagnosis Date  . Hypertension   . Sinus complaint   . Diabetes mellitus     diet controlled, no meds now, was on insulin & po treatment at one time    . Headache(784.0)     occas. relative to sinuses   . Arthritis     back, knees, shoulder, hands , injections in pelvis, for bone spurs    PAST SURGICAL HISTORY: Past Surgical History  Procedure Laterality Date  . Abdominal hysterectomy    . Bone spurs      removed  . Anterior fusion cervical spine  2000  . Lumbar laminectomy Left 06/30/2013    Procedure: Left L3-4 Extraforaminal approach to excise far lateral herniated nucleus pulposus;  Surgeon: Jessy Oto, MD;  Location: [facility];  Service: Orthopedics;  Laterality: Left;    FAMILY HISTORY: No family history on file.  SOCIAL HISTORY:  Social History   Social History  . Marital Status: Single    Spouse Name: N/A  . Number of Children: N/A  . Years of Education: N/A  Occupational History  . Not on file.   Social History Main Topics  . Smoking status: Former Research scientist (life sciences)  . Smokeless tobacco: Former Systems developer    Quit date: 06/30/1982  . Alcohol Use: Yes     Comment: ocass. - social   . Drug Use: No  . Sexual Activity: Not on file   Other Topics Concern  . Not on file   Social History Narrative     PHYSICAL EXAM   There were no vitals filed for this visit.  Not recorded      There is no weight on file to calculate BMI.  PHYSICAL EXAMNIATION:  Gen: NAD, conversant, well nourised, obese, well groomed                       Cardiovascular: Regular rate rhythm, no peripheral edema, warm, nontender. Eyes: Conjunctivae clear without exudates or hemorrhage Neck: Supple, no carotid bruise. Pulmonary: Clear to auscultation bilaterally   NEUROLOGICAL EXAM:  MENTAL STATUS: Speech:    Speech is normal; fluent and spontaneous with normal comprehension.  Cognition:     Orientation to time, place and person     Normal recent and remote memory     Normal Attention span and concentration     Normal Language, naming, repeating,spontaneous speech     Fund of knowledge   CRANIAL NERVES: CN II: Visual fields are full to confrontation. Fundoscopic exam is normal with sharp discs and no vascular changes. Pupils are round equal and briskly reactive to light. CN III, IV, VI: extraocular movement are normal. No ptosis. CN V: Facial sensation is intact to pinprick in all 3 divisions bilaterally. Corneal responses are intact.  CN VII: Face is symmetric with normal eye closure and smile. CN VIII: Hearing is normal to rubbing fingers CN IX, X: Palate elevates symmetrically. Phonation is normal. CN XI: Head turning and shoulder shrug are intact CN XII: Tongue is midline with normal movements and no atrophy.  MOTOR: There is no pronator drift of out-stretched arms. Muscle bulk and tone are normal. Muscle strength is normal.  REFLEXES: Reflexes are 2+ and symmetric at the biceps, triceps, knees, and ankles. Plantar responses are flexor.  SENSORY: Intact to light touch, pinprick, position sense, and vibration sense are intact in fingers and toes.  COORDINATION: Rapid alternating movements and fine finger movements are intact. There is no dysmetria on finger-to-nose and heel-knee-shin.    GAIT/STANCE: Posture is normal. Gait is steady with normal steps, base, arm swing, and turning. Heel and toe walking are normal. Tandem gait is normal.  Romberg is absent.   Apley's maneuver: I was not able to induce nystagmus or vertigo  with right ear, left ear dependent position  DIAGNOSTIC DATA (LABS, IMAGING, TESTING) - I reviewed patient records, labs, notes, testing and imaging myself where available.   ASSESSMENT AND PLAN  Angela Pugh is a 69 y.o. female with vascular risk factor of aging, hypertension, hyperlipidemia presenting with sudden onset of dizziness  Vertigo  Need to rule out posterior circulation insufficiency, possibility also includes benign positional vertigo  I have suggested a repeat positional maneuver  Aspirin 81 mg daily  MRI of the brain Right side low back pain, radiating pain to right hip,tenderness upon deep palpitation at right SI joints     differentiation diagnosis includes right lumbar radiculopathy, right SI joint pathology   MRI of lumbar  Marcial Pacas, M.D. Ph.D.  [facility] [address] [place], [place]  [address] Ph: [phone] Fax: [phone]  CC: Wallene Huh, MD

## 2015-02-03 ENCOUNTER — Ambulatory Visit
Admission: RE | Admit: 2015-02-03 | Discharge: 2015-02-03 | Disposition: A | Discharge status: Home / Self Care | Payer: Medicare Other | Source: Ambulatory Visit | Attending: Neurology | Admitting: Neurology

## 2015-02-03 DIAGNOSIS — R42 Dizziness and giddiness: Secondary | ICD-10-CM

## 2015-02-03 DIAGNOSIS — M545 Low back pain, unspecified: Secondary | ICD-10-CM

## 2015-02-04 ENCOUNTER — Telehealth: Payer: Self-pay | Admitting: Neurology

## 2015-02-04 NOTE — Telephone Encounter (Signed)
Please call patient, MRI of lumbar showed multilevel degenerative disc disease MRI of the brain showed mild small vessel disease, age-related changes  I will review detail at her next follow-up visit February 12 2015  This is an abnormal MRI of the lumbar spine show the following: 1. Small left lateral disc herniation at L3-L4 superimposed on prior disc protrusion combined with facet hypertrophy to cause moderately severe left foraminal narrowing that could lead to compression of the left L3 nerve root. The changes are similar to what was observed on the MRI dated 10/02/2013. 2. Minimal anterolisthesis, disc bulging and severe facet hypertrophy at L4-L5 causes moderately severe right foraminal narrowing that could lead to right L4 nerve root compression. The degenerative changes are similar to what was observed on MRI 10/02/2013. 3. Milder degenerative changes at L5-S1 are unchanged.  abnormal MRI of the brain without contrast showing the following: 1. Scattered T2/flair hyperintense foci in the hemispheres bilaterally consistent with mild age related chronic microvascular changes. Compared to the MRI dated 11/01/2003, these changes have progressed over the ensuing 11 years. 2. Skull base changes centrally and to the left consistent with fibrous dysplasia. This is unchanged when compared to the prior MRI. 3. The internal auditory canals, their contents and the mastoid air cells appear normal.

## 2015-02-04 NOTE — Telephone Encounter (Signed)
Patient aware of MRI results - she will keep her follow up to further discuss.

## 2015-02-12 ENCOUNTER — Ambulatory Visit (INDEPENDENT_AMBULATORY_CARE_PROVIDER_SITE_OTHER): Payer: Medicare Other | Admitting: Neurology

## 2015-02-12 ENCOUNTER — Encounter: Payer: Self-pay | Admitting: Neurology

## 2015-02-12 VITALS — BP 161/81 | HR 60 | Ht 69.0 in | Wt 178.0 lb

## 2015-02-12 DIAGNOSIS — M792 Neuralgia and neuritis, unspecified: Secondary | ICD-10-CM

## 2015-02-12 MED ORDER — GABAPENTIN 100 MG PO CAPS: 300.0000 mg | ORAL_CAPSULE | Freq: Three times a day (TID) | ORAL | Status: DC

## 2015-02-12 MED ORDER — LIDOCAINE 0.5 % EX GEL: CUTANEOUS | Status: DC

## 2015-02-12 NOTE — Progress Notes (Signed)
Chief Complaint  Patient presents with  . Dizziness    Her dizziness has improved. Feels the repositional exercises were helpful.  She would like to discuss her brain MRI  . Back Pain    She would like to review her lumbar MRI.      PATIENT: Angela Pugh DOB: 1945/09/28  Chief Complaint  Patient presents with  . Dizziness    Her dizziness has improved. Feels the repositional exercises were helpful.  She would like to discuss her brain MRI  . Back Pain    She would like to review her lumbar MRI.     HISTORICAL  Angela Pugh is a 69 years old right-handed female, seen in refer by her primary care physician Dr. Wallene Huh for evaluation of dizziness  She had a past medical history of hypertension, hyperlipidemia, since August 2016, she had a mild episode of transient dizziness induced by sudden positional movement.  In August 31st 2016, around 6 PM, she went to her yard, when bending over, she had sudden onset vertigo, nausea, gait difficulty, blurry vision, she was brought by ambulance to the emergency room, I have personally reviewed CAT scan of the brain showed no acute lesions, CBC CMP was normal symptoms last about 6 hours, gradually subsided after she take 2 doses of meclizine 50 mg  Even now, she complains of mild wooziness, transient dizziness with sudden positional change, she denied tinnitus, no hearing loss, no longer has gait difficulty  Over past 1 week, since early September 2016, she also noticed worsening right low back pain, radiating along right hip, no skin rash broke out, no left leg involvement,  UPDATE Sep 27th 2016: She still complains of right low back pain, radiating to her right hip, right leg, "like somebody is cutting her", take goody power  3-4 daily.  She has no left leg pain, She also complains of bilateral knee pain.   She had lumbar decompression surgery by Dr. Louanne Skye in February 2016, Left L3-4 Extraforaminal approach to excise far lateral  herniated nucleus pulposus, OR Microscope.  I have reviewed MRIs with patient, MRI of the brain in September 2016 showed mild small vessel disease MRI of lumbar in September 2016:Small left lateral disc herniation at L3-L4 superimposed on prior disc protrusion combined with facet hypertrophy to cause moderately severe left foraminal narrowing that could lead to compression of the left L3 nerve root. The changes are similar to what was observed on the MRI dated 10/02/2013. 2. Minimal anterolisthesis, disc bulging and severe facet hypertrophy at L4-L5 causes moderately severe right foraminal narrowing that could lead to right L4 nerve root compression. The degenerative changes are similar to what was observed on MRI 10/02/2013. 3. Milder degenerative changes at L5-S1 are unchanged.    REVIEW OF SYSTEMS: Full 14 system review of systems performed and notable only for blurry vision, shortness of breath, excessive thirst, joints pain, frequent awakening, daytime sleepiness, snoring, dizziness, joints pain  Allergies  Allergen Reactions  . Acetaminophen Hives and Nausea Only    HOME MEDICATIONS: Current Outpatient Prescriptions  Medication Sig Dispense Refill  . colesevelam (WELCHOL) 625 MG tablet Take by mouth 2 (two) times daily with a meal.    . losartan-hydrochlorothiazide (HYZAAR) 100-25 MG per tablet Take 1 tablet by mouth daily.     . meclizine (ANTIVERT) 50 MG tablet Take 1 tablet (50 mg total) by mouth 3 (three) times daily as needed. 30 tablet 0   No current facility-administered medications for this  visit.    PAST MEDICAL HISTORY: Past Medical History  Diagnosis Date  . Hypertension   . Sinus complaint   . Diabetes mellitus     diet controlled, no meds now, was on insulin & po treatment at one time    . Headache(784.0)     occas. relative to sinuses   . Arthritis     back, knees, shoulder, hands , injections in pelvis, for bone spurs    PAST SURGICAL HISTORY: Past  Surgical History  Procedure Laterality Date  . Abdominal hysterectomy    . Bone spurs      removed  . Anterior fusion cervical spine  2000  . Lumbar laminectomy Left 06/30/2013    Procedure: Left L3-4 Extraforaminal approach to excise far lateral herniated nucleus pulposus;  Surgeon: Jessy Oto, MD;  Location: [facility];  Service: Orthopedics;  Laterality: Left;    FAMILY HISTORY: No family history on file.  SOCIAL HISTORY:  Social History   Social History  . Marital Status: Single    Spouse Name: N/A  . Number of Children: N/A  . Years of Education: N/A   Occupational History  . Not on file.   Social History Main Topics  . Smoking status: Former Research scientist (life sciences)  . Smokeless tobacco: Former Systems developer    Quit date: 06/30/1982  . Alcohol Use: Yes     Comment: ocass. - social   . Drug Use: No  . Sexual Activity: Not on file   Other Topics Concern  . Not on file   Social History Narrative     PHYSICAL EXAM   Filed Vitals:   02/12/15 1332  BP: 161/81  Pulse: 60  Height: 5\' 9"  (1.753 m)  Weight: 178 lb (80.74 kg)    Not recorded      Body mass index is 26.27 kg/(m^2).  PHYSICAL EXAMNIATION:  Gen: NAD, conversant, well nourised, obese, well groomed                     Cardiovascular: Regular rate rhythm, no peripheral edema, warm, nontender. Eyes: Conjunctivae clear without exudates or hemorrhage Neck: Supple, no carotid bruise. Pulmonary: Clear to auscultation bilaterally   NEUROLOGICAL EXAM:  MENTAL STATUS: Speech:    Speech is normal; fluent and spontaneous with normal comprehension.  Cognition:     Orientation to time, place and person     Normal recent and remote memory     Normal Attention span and concentration     Normal Language, naming, repeating,spontaneous speech     Fund of knowledge   CRANIAL NERVES: CN II: Visual fields are full to confrontation. Fundoscopic exam is normal with sharp discs and no vascular changes. Pupils are round equal and  briskly reactive to light. CN III, IV, VI: extraocular movement are normal. No ptosis. CN V: Facial sensation is intact to pinprick in all 3 divisions bilaterally. Corneal responses are intact.  CN VII: Face is symmetric with normal eye closure and smile. CN VIII: Hearing is normal to rubbing fingers CN IX, X: Palate elevates symmetrically. Phonation is normal. CN XI: Head turning and shoulder shrug are intact CN XII: Tongue is midline with normal movements and no atrophy.  MOTOR: There is no pronator drift of out-stretched arms. Muscle bulk and tone are normal. Muscle strength is normal.  REFLEXES: Reflexes are 2+ and symmetric at the biceps, triceps, knees, and ankles. Plantar responses are flexor.  SENSORY: Intact to light touch, pinprick, position sense, and vibration sense are  intact in fingers and toes.  COORDINATION: Rapid alternating movements and fine finger movements are intact. There is no dysmetria on finger-to-nose and heel-knee-shin.    GAIT/STANCE: Posture is normal. Gait is steady with normal steps, base, arm swing, and turning. Heel and toe walking are normal. Tandem gait is normal.  Romberg is absent.   Apley's maneuver: I was not able to induce nystagmus or vertigo with right ear, left ear dependent position  DIAGNOSTIC DATA (LABS, IMAGING, TESTING) - I reviewed patient records, labs, notes, testing and imaging myself where available.   ASSESSMENT AND PLAN  Angela Pugh is a 69 y.o. female with vascular risk factor of aging, hypertension, hyperlipidemia presenting with sudden onset of dizziness  Vertigo  benign positional vertigo,Improved with repositioning positional maneuver  Keep Aspirin 81 mg daily   Right side low back pain, radiating pain to right hip,   Neurontin 100 mg 3 times a day  Marcial Pacas, M.D. Ph.D.  [facility] [address], [place], [place] [address] Ph: [phone] Fax: [phone]  CC: Wallene Huh, MD

## 2015-02-26 ENCOUNTER — Ambulatory Visit (INDEPENDENT_AMBULATORY_CARE_PROVIDER_SITE_OTHER): Payer: Medicare Other | Admitting: Podiatry

## 2015-02-26 DIAGNOSIS — B351 Tinea unguium: Secondary | ICD-10-CM | POA: Diagnosis not present

## 2015-02-26 DIAGNOSIS — Q828 Other specified congenital malformations of skin: Secondary | ICD-10-CM

## 2015-02-26 DIAGNOSIS — M79673 Pain in unspecified foot: Secondary | ICD-10-CM

## 2015-02-26 NOTE — Progress Notes (Signed)
Patient ID: Angela Pugh, female   DOB: [birth year], 69 y.o.   MRN: [id number] Complaint:  Visit Type: Patient returns to my office for continued preventative foot care services. Complaint: Patient states" my nails have grown long and thick and become painful to walk and wear shoes" . The patient presents for preventative foot care services. No changes to ROS.  Patient has multiple porokeratosis B/l  Podiatric Exam: Vascular: dorsalis pedis and posterior tibial pulses are palpable bilateral. Capillary return is immediate. Temperature gradient is WNL. Skin turgor WNL  Sensorium: Normal Semmes Weinstein monofilament test. Normal tactile sensation bilaterally. Nail Exam: Pt has thick disfigured discolored nails with subungual debris noted bilateral entire nail hallux through fifth toenails Ulcer Exam: There is no evidence of ulcer or pre-ulcerative changes or infection. Orthopedic Exam: Muscle tone and strength are WNL. No limitations in general ROM. No crepitus or effusions noted. Foot type and digits show no abnormalities. Bony prominences are unremarkable. Skin:  Porokeratosis sub 3 B/L and hallux B/L. No infection or ulcers  Diagnosis:  Onychomycosis, , Pain in right toe, pain in left toes, Porokeratosis  Treatment & Plan Procedures and Treatment: Consent by patient was obtained for treatment procedures. The patient understood the discussion of treatment and procedures well. All questions were answered thoroughly reviewed. Debridement of mycotic and hypertrophic toenails, 1 through 5 bilateral and clearing of subungual debris. No ulceration, no infection noted. Debride porokeratosis. Initiate diabetic shoe paperwork. Return Visit-Office Procedure: Patient instructed to return to the office for a follow up visit 3 months for continued evaluation and treatment.

## 2015-03-14 ENCOUNTER — Encounter: Payer: Self-pay | Admitting: Neurology

## 2015-03-14 ENCOUNTER — Ambulatory Visit (INDEPENDENT_AMBULATORY_CARE_PROVIDER_SITE_OTHER): Payer: Medicare Other | Admitting: Neurology

## 2015-03-14 VITALS — BP 145/83 | HR 61 | Ht 69.0 in | Wt 174.0 lb

## 2015-03-14 DIAGNOSIS — M25552 Pain in left hip: Secondary | ICD-10-CM

## 2015-03-14 DIAGNOSIS — M545 Low back pain: Secondary | ICD-10-CM | POA: Diagnosis not present

## 2015-03-14 DIAGNOSIS — R42 Dizziness and giddiness: Secondary | ICD-10-CM

## 2015-03-14 NOTE — Progress Notes (Signed)
Chief Complaint  Patient presents with  . Back Pain    She has not been able to take gabapentin regularly due to it causing excessive drowsiness.  She only takes it when she knows she will be home for the day. She still has sudden, sharp pains in her right hip at times.  . Gait Problem    She has not been feeling "swimmy headed" lately but she had felt unsteady when walking. Feels she has weakness in her left leg.      PATIENT: Angela Pugh DOB: 18-Sep-1945  Chief Complaint  Patient presents with  . Back Pain    She has not been able to take gabapentin regularly due to it causing excessive drowsiness.  She only takes it when she knows she will be home for the day. She still has sudden, sharp pains in her right hip at times.  . Gait Problem    She has not been feeling "swimmy headed" lately but she had felt unsteady when walking. Feels she has weakness in her left leg.     HISTORICAL  Angela Pugh is a 69 years old right-handed female, seen in refer by her primary care physician Dr. Wallene Huh for evaluation of dizziness  She had a past medical history of hypertension, hyperlipidemia, since August 2016, she had a mild episode of transient dizziness induced by sudden positional movement.  In August 31st 2016, around 6 PM, she went to her yard, when bending over, she had sudden onset vertigo, nausea, gait difficulty, blurry vision, she was brought by ambulance to the emergency room, I have personally reviewed CAT scan of the brain showed no acute lesions, CBC CMP was normal symptoms last about 6 hours, gradually subsided after she take 2 doses of meclizine 50 mg  Even now, she complains of mild wooziness, transient dizziness with sudden positional change, she denied tinnitus, no hearing loss, no longer has gait difficulty  Over past 1 week, since early September 2016, she also noticed worsening right low back pain, radiating along right hip, no skin rash broke out, no left leg  involvement,  UPDATE Sep 27th 2016: She still complains of right low back pain, radiating to her right hip, right leg, "like somebody is cutting her", take goody power  3-4 daily.  She has no left leg pain, She also complains of bilateral knee pain.   She had lumbar decompression surgery by Dr. Louanne Skye in February 2016, Left L3-4 Extraforaminal approach to excise far lateral herniated nucleus pulposus, OR Microscope.  I have reviewed MRIs with patient, MRI of the brain in September 2016 showed mild small vessel disease MRI of lumbar in September 2016:Small left lateral disc herniation at L3-L4 superimposed on prior disc protrusion combined with facet hypertrophy to cause moderately severe left foraminal narrowing that could lead to compression of the left L3 nerve root. The changes are similar to what was observed on the MRI dated 10/02/2013. 2. Minimal anterolisthesis, disc bulging and severe facet hypertrophy at L4-L5 causes moderately severe right foraminal narrowing that could lead to right L4 nerve root compression. The degenerative changes are similar to what was observed on MRI 10/02/2013. 3. Milder degenerative changes at L5-S1 are unchanged.  UPDATE Mar 14 2015: She still has mild dizziness but overall has much improved,  Her main concern today is her intermittent low back pain,  Sometimes radiating pain right back to her right leg, she also complains of left leg stiffness, left hip pain when she got  up from seated position,  She has to work her left hip before she can go on  She has appt with orthopedic surgeon Dr. Louanne Skye at the Jan 2017.   REVIEW OF SYSTEMS: Full 14 system review of systems performed and notable only for blurry vision, shortness of breath, excessive thirst, joints pain, frequent awakening, daytime sleepiness, snoring, dizziness, joints pain  Allergies  Allergen Reactions  . Acetaminophen Hives and Nausea Only    HOME MEDICATIONS: Current Outpatient  Prescriptions  Medication Sig Dispense Refill  . aspirin 81 MG tablet Take 81 mg by mouth daily.    . colesevelam (WELCHOL) 625 MG tablet Take by mouth 2 (two) times daily with a meal.    . fluocinonide cream (LIDEX) 0.05 % APP AA BID PRN  2  . gabapentin (NEURONTIN) 100 MG capsule Take 3 capsules (300 mg total) by mouth 3 (three) times daily. 270 capsule 3  . Lidocaine 0.5 % GEL Apply to right low thoracic area as needed. 170 g 3  . losartan-hydrochlorothiazide (HYZAAR) 100-25 MG per tablet Take 1 tablet by mouth daily.     . meclizine (ANTIVERT) 50 MG tablet Take 1 tablet (50 mg total) by mouth 3 (three) times daily as needed. 30 tablet 0   No current facility-administered medications for this visit.    PAST MEDICAL HISTORY: Past Medical History  Diagnosis Date  . Hypertension   . Sinus complaint   . Diabetes mellitus     diet controlled, no meds now, was on insulin & po treatment at one time    . Headache(784.0)     occas. relative to sinuses   . Arthritis     back, knees, shoulder, hands , injections in pelvis, for bone spurs    PAST SURGICAL HISTORY: Past Surgical History  Procedure Laterality Date  . Abdominal hysterectomy    . Bone spurs      removed  . Anterior fusion cervical spine  2000  . Lumbar laminectomy Left 06/30/2013    Procedure: Left L3-4 Extraforaminal approach to excise far lateral herniated nucleus pulposus;  Surgeon: Jessy Oto, MD;  Location: [facility];  Service: Orthopedics;  Laterality: Left;    FAMILY HISTORY: No family history on file.  SOCIAL HISTORY:  Social History   Social History  . Marital Status: Single    Spouse Name: N/A  . Number of Children: N/A  . Years of Education: N/A   Occupational History  . Not on file.   Social History Main Topics  . Smoking status: Former Research scientist (life sciences)  . Smokeless tobacco: Former Systems developer    Quit date: 06/30/1982  . Alcohol Use: Yes     Comment: ocass. - social   . Drug Use: No  . Sexual Activity: Not on  file   Other Topics Concern  . Not on file   Social History Narrative     PHYSICAL EXAM   Filed Vitals:   03/14/15 0839  BP: 145/83  Pulse: 61  Height: 5\' 9"  (1.753 m)  Weight: 174 lb (78.926 kg)    Not recorded      Body mass index is 25.68 kg/(m^2).  PHYSICAL EXAMNIATION:  Gen: NAD, conversant, well nourised, obese, well groomed                     Cardiovascular: Regular rate rhythm, no peripheral edema, warm, nontender. Eyes: Conjunctivae clear without exudates or hemorrhage Neck: Supple, no carotid bruise. Pulmonary: Clear to auscultation bilaterally   NEUROLOGICAL  EXAM:  MENTAL STATUS: Speech:    Speech is normal; fluent and spontaneous with normal comprehension.  Cognition:     Orientation to time, place and person     Normal recent and remote memory     Normal Attention span and concentration     Normal Language, naming, repeating,spontaneous speech     Fund of knowledge   CRANIAL NERVES: CN II: Visual fields are full to confrontation.Pupils are round equal and briskly reactive to light. CN III, IV, VI: extraocular movement are normal. No ptosis. CN V: Facial sensation is intact to pinprick in all 3 divisions bilaterally. Corneal responses are intact.  CN VII: Face is symmetric with normal eye closure and smile. CN VIII: Hearing is normal to rubbing fingers CN IX, X: Palate elevates symmetrically. Phonation is normal. CN XI: Head turning and shoulder shrug are intact CN XII: Tongue is midline with normal movements and no atrophy.  MOTOR: There is no pronator drift of out-stretched arms. Muscle bulk and tone are normal. Muscle strength is normal. Limited range of motion of left hip, REFLEXES: Reflexes are 2+ and symmetric at the biceps, triceps, knees, and ankles. Plantar responses are flexor.  SENSORY: Intact to light touch, pinprick, position sense, and vibration sense are intact in fingers and toes.  COORDINATION: Rapid alternating movements  and fine finger movements are intact. There is no dysmetria on finger-to-nose and heel-knee-shin.    GAIT/STANCE:  mildly antalgic steady gait   DIAGNOSTIC DATA (LABS, IMAGING, TESTING) - I reviewed patient records, labs, notes, testing and imaging myself where available.   ASSESSMENT AND PLAN  DANESE Pugh is a 69 y.o. female with vascular risk factor of aging, hypertension, hyperlipidemia presenting with sudden onset of dizziness  Vertigo  benign positional vertigo,Improved with repositioning positional maneuver , overall has much improved  Keep Aspirin 81 mg daily   Right side low back pain, radiating pain to right leg Left hip pain  Neurontin 100 mg 3 times a day, was helpful, but cause drowsiness,   She will be followed up by orthopedic surgeon Dr. Derry Lory, M.D. Ph.D.   [facility] [address] [place], [place] [address] Ph: [phone] Fax: [phone]  CC: Wallene Huh, MD

## 2015-05-03 ENCOUNTER — Ambulatory Visit: Payer: Medicare Other | Admitting: *Deleted

## 2015-05-03 DIAGNOSIS — E1140 Type 2 diabetes mellitus with diabetic neuropathy, unspecified: Secondary | ICD-10-CM

## 2015-05-03 NOTE — Progress Notes (Signed)
Patient ID: Angela Pugh, female   DOB: 1945/06/18, 69 y.o.   MRN: [id number] Patient presents to be scanned and measured for diabetic shoes and inserts.

## 2015-05-29 ENCOUNTER — Other Ambulatory Visit (HOSPITAL_COMMUNITY): Payer: Self-pay | Admitting: Cardiology

## 2015-05-29 DIAGNOSIS — I209 Angina pectoris, unspecified: Secondary | ICD-10-CM

## 2015-05-31 ENCOUNTER — Other Ambulatory Visit (HOSPITAL_COMMUNITY): Payer: Medicare Other

## 2015-05-31 ENCOUNTER — Encounter (HOSPITAL_COMMUNITY): Payer: Medicare Other

## 2015-05-31 ENCOUNTER — Encounter (HOSPITAL_COMMUNITY)
Admission: RE | Admit: 2015-05-31 | Discharge: 2015-05-31 | Disposition: A | Discharge status: Home / Self Care | Payer: Medicare Other | Source: Ambulatory Visit | Attending: Cardiology | Admitting: Cardiology

## 2015-05-31 DIAGNOSIS — I209 Angina pectoris, unspecified: Secondary | ICD-10-CM | POA: Diagnosis present

## 2015-05-31 MED ORDER — TECHNETIUM TC 99M SESTAMIBI GENERIC - CARDIOLITE
30.0000 | Freq: Once | INTRAVENOUS | Status: AC | PRN
  Administered 2015-05-31: 30 via INTRAVENOUS

## 2015-05-31 MED ORDER — REGADENOSON 0.4 MG/5ML IV SOLN
INTRAVENOUS | Status: AC
  Administered 2015-05-31: 0.4 mg
  Filled 2015-05-31: qty 5

## 2015-05-31 MED ORDER — TECHNETIUM TC 99M SESTAMIBI GENERIC - CARDIOLITE
10.0000 | Freq: Once | INTRAVENOUS | Status: AC | PRN
  Administered 2015-05-31: 10 via INTRAVENOUS

## 2015-06-01 LAB — NM MYOCAR SINGLE W/SPECT
CHL CUP STRESS STAGE 2 HR: 61 {beats}/min
CHL CUP STRESS STAGE 3 DBP: 97 mmHg
CHL CUP STRESS STAGE 3 SBP: 174 mmHg
CHL CUP STRESS STAGE 4 DBP: 99 mmHg
CSEPEW: 1 METS
CSEPPBP: 178 mmHg
CSEPPHR: 75 {beats}/min
CSEPPMHR: 49 %
Stage 1 DBP: 96 mmHg
Stage 1 Grade: 0 %
Stage 1 HR: 62 {beats}/min
Stage 1 SBP: 165 mmHg
Stage 1 Speed: 0 mph
Stage 2 Grade: 0 %
Stage 2 Speed: 0 mph
Stage 3 Grade: 0 %
Stage 3 HR: 77 {beats}/min
Stage 3 Speed: 0 mph
Stage 4 Grade: 0 %
Stage 4 HR: 75 {beats}/min
Stage 4 SBP: 178 mmHg
Stage 4 Speed: 0 mph

## 2015-06-04 ENCOUNTER — Ambulatory Visit: Payer: Medicare Other | Admitting: Podiatry

## 2015-06-05 ENCOUNTER — Encounter: Payer: Self-pay | Admitting: Podiatry

## 2015-06-05 ENCOUNTER — Ambulatory Visit (INDEPENDENT_AMBULATORY_CARE_PROVIDER_SITE_OTHER): Payer: Medicare Other | Admitting: Podiatry

## 2015-06-05 VITALS — BP 128/84 | HR 74 | Resp 12

## 2015-06-05 DIAGNOSIS — B351 Tinea unguium: Secondary | ICD-10-CM

## 2015-06-05 DIAGNOSIS — M79674 Pain in right toe(s): Secondary | ICD-10-CM

## 2015-06-05 DIAGNOSIS — Q828 Other specified congenital malformations of skin: Secondary | ICD-10-CM

## 2015-06-05 DIAGNOSIS — M79675 Pain in left toe(s): Secondary | ICD-10-CM

## 2015-06-05 DIAGNOSIS — E1140 Type 2 diabetes mellitus with diabetic neuropathy, unspecified: Secondary | ICD-10-CM

## 2015-06-05 NOTE — Patient Instructions (Addendum)
Today I packed the painful calluses on the balls the right and left feet with salinocaine to reduce some of the thick tissue in these areas. If possible leave on and keep dry 3-5 days  Diabetes and Foot Care Diabetes may cause you to have problems because of poor blood supply (circulation) to your feet and legs. This may cause the skin on your feet to become thinner, break easier, and heal more slowly. Your skin may become dry, and the skin may peel and crack. You may also have nerve damage in your legs and feet causing decreased feeling in them. You may not notice minor injuries to your feet that could lead to infections or more serious problems. Taking care of your feet is one of the most important things you can do for yourself.  HOME CARE INSTRUCTIONS  Wear shoes at all times, even in the house. Do not go barefoot. Bare feet are easily injured.  Check your feet daily for blisters, cuts, and redness. If you cannot see the bottom of your feet, use a mirror or ask someone for help.  Wash your feet with warm water (do not use hot water) and mild soap. Then pat your feet and the areas between your toes until they are completely dry. Do not soak your feet as this can dry your skin.  Apply a moisturizing lotion or petroleum jelly (that does not contain alcohol and is unscented) to the skin on your feet and to dry, brittle toenails. Do not apply lotion between your toes.  Trim your toenails straight across. Do not dig under them or around the cuticle. File the edges of your nails with an emery board or nail file.  Do not cut corns or calluses or try to remove them with medicine.  Wear clean socks or stockings every day. Make sure they are not too tight. Do not wear knee-high stockings since they may decrease blood flow to your legs.  Wear shoes that fit properly and have enough cushioning. To break in new shoes, wear them for just a few hours a day. This prevents you from injuring your feet.  Always look in your shoes before you put them on to be sure there are no objects inside.  Do not cross your legs. This may decrease the blood flow to your feet.  If you find a minor scrape, cut, or break in the skin on your feet, keep it and the skin around it clean and dry. These areas may be cleansed with mild soap and water. Do not cleanse the area with peroxide, alcohol, or iodine.  When you remove an adhesive bandage, be sure not to damage the skin around it.  If you have a wound, look at it several times a day to make sure it is healing.  Do not use heating pads or hot water bottles. They may burn your skin. If you have lost feeling in your feet or legs, you may not know it is happening until it is too late.  Make sure your health care provider performs a complete foot exam at least annually or more often if you have foot problems. Report any cuts, sores, or bruises to your health care provider immediately. SEEK MEDICAL CARE IF:   You have an injury that is not healing.  You have cuts or breaks in the skin.  You have an ingrown nail.  You notice redness on your legs or feet.  You feel burning or tingling in your legs  or feet.  You have pain or cramps in your legs and feet.  Your legs or feet are numb.  Your feet always feel cold. SEEK IMMEDIATE MEDICAL CARE IF:   There is increasing redness, swelling, or pain in or around a wound.  There is a red line that goes up your leg.  Pus is coming from a wound.  You develop a fever or as directed by your health care provider.  You notice a bad smell coming from an ulcer or wound.   This information is not intended to replace advice given to you by your health care provider. Make sure you discuss any questions you have with your health care provider.   Document Released: 05/01/2000 Document Revised: 01/04/2013 Document Reviewed: 10/11/2012 Elsevier Interactive Patient Education 2016 [employer]. Diabetic Shoe and Insoles  Instructions   Congratulations on receiving your new shoes and insoles. In accordance with Medicare regulations, they have been selected from our own inventory or have been special ordered to provide you with the optimum comfort and protection. In order to receive the greatest benefit from this footwear, please follow these suggested guidelines.   Initial use of your diabetic shoes It may take a couple of days for you to feel fully comfortable wearing your new diabetic shoes and insoles. Some people are immediately comfortable wearing them, while others need more time to adjust. Generally, the body does not adapt to change rapidly and you may experience mild aches or fatigue when you first begin to wear your shoes.  Wear these shoes as long as they are comfortable. Try to only wear them indoors until you feel that they are comfortable for outdoor use.  If they bother your feet, TAKE THEM OFF and try them again a few hours later or the next day. Check for any soreness or swelling and notify your doctor immediately if you notice any of these signs. Otherwise, increase wear time as they become more comfortable. If you feel like your shoes are bothersome still after trying these tips, please call our office. Keep in mind, that once the outer sole of the shoe becomes dirty, we can no longer send them back.  The insoles should be changed every 4 months and replaced with a new pair.   Maintenance of your new shoes Your new diabetic shoes can be washed with mild soap and water and air dried. Try to keep away from high heat sources (heaters, dryers, fireplaces, etc.) as this may damage or distort the plastazote lining and insert in the shoe. Do NOT machine wash or use harsh chemicals such as bleach on your shoes. These shoes, if properly taken care of, should last one year.   Continuing Care Diabetic patients have fewer foot complications if they have been properly fitted in the correct type of footwear and  accommodating insoles. The desired outcome is to fit you with the most appropriate, best fitting shoe possible in order to reduce the risk of and prevent foot complications, which could ultimately lead to ulceration, infection and amputation. Medicare will pay for one pair of extra depth shoes and 3 pairs of insoles per calendar year.   REMEMBER TO SEE YOUR PODIATRIST FOR REGULAR FOOT EXAMS AT LEAST ONCE PER YEAR.

## 2015-06-05 NOTE — Progress Notes (Deleted)
Patient ID: Angela Pugh, female   DOB: 1945-08-31, 70 y.o.   MRN: [id number]  Patient presents for diabetic foot care

## 2015-06-06 NOTE — Progress Notes (Signed)
Patient ID: Angela Pugh, female   DOB: [birth year], 70 y.o.   MRN: [id number]  Subjective: This patient presents today complaining of thickened and elongated toenails which are uncomfortable walking wearing shoes and requests toenail debridement. Also, patient is complaining of extremely painful plantar keratoses on the right and left feet. Patient last presented for similar service on 02/26/2015   Chart review Chart note written by Dr. Harriet Masson on 05/29/2014   Harriet Masson DPM    ssessment diabetes with peripheral neuropathy and angiopathy complications painful mycotic nails debrided 10 keratoses are debrided as well bilateral this time dispensed diabetic shoes and custom molded insoles shoes and insoles fit and contour well to the foot written instructions for shoewear break in are given follow-up in 3 months for continued palliative care in the future as needed next      Revision History        Orientated 3 No open skin lesions bilaterally The toenails are elongated, brittle, deformed, discolored and tender directly palpation 6-10 Large nucleated plantar keratoses sub-third MPJ bilaterally Small keratoses plantar left hallux  Assessment: Symptomatic onychomycoses 6-10  Porokeratosis 2 Diabetic with a history of neuropathy  Plan: Debridement toenails 6-10 mechanically and electronically without any bleeding Debride the porokeratosis 2 without any bleeding. These areas were packed with salinocaine and patient was advised to leave bandages on if possible 3-5 days  Reappoint 3 months

## 2015-09-04 ENCOUNTER — Ambulatory Visit (INDEPENDENT_AMBULATORY_CARE_PROVIDER_SITE_OTHER): Payer: Medicare Other | Admitting: Podiatry

## 2015-09-04 ENCOUNTER — Encounter: Payer: Self-pay | Admitting: Podiatry

## 2015-09-04 DIAGNOSIS — B351 Tinea unguium: Secondary | ICD-10-CM | POA: Diagnosis not present

## 2015-09-04 DIAGNOSIS — Q828 Other specified congenital malformations of skin: Secondary | ICD-10-CM | POA: Diagnosis not present

## 2015-09-04 DIAGNOSIS — M79674 Pain in right toe(s): Secondary | ICD-10-CM

## 2015-09-04 DIAGNOSIS — M79675 Pain in left toe(s): Secondary | ICD-10-CM | POA: Diagnosis not present

## 2015-09-04 DIAGNOSIS — E0842 Diabetes mellitus due to underlying condition with diabetic polyneuropathy: Secondary | ICD-10-CM | POA: Diagnosis not present

## 2015-09-04 NOTE — Patient Instructions (Signed)
Diabetes and Foot Care Diabetes may cause you to have problems because of poor blood supply (circulation) to your feet and legs. This may cause the skin on your feet to become thinner, break easier, and heal more slowly. Your skin may become dry, and the skin may peel and crack. You may also have nerve damage in your legs and feet causing decreased feeling in them. You may not notice minor injuries to your feet that could lead to infections or more serious problems. Taking care of your feet is one of the most important things you can do for yourself.  HOME CARE INSTRUCTIONS  Wear shoes at all times, even in the house. Do not go barefoot. Bare feet are easily injured.  Check your feet daily for blisters, cuts, and redness. If you cannot see the bottom of your feet, use a mirror or ask someone for help.  Wash your feet with warm water (do not use hot water) and mild soap. Then pat your feet and the areas between your toes until they are completely dry. Do not soak your feet as this can dry your skin.  Apply a moisturizing lotion or petroleum jelly (that does not contain alcohol and is unscented) to the skin on your feet and to dry, brittle toenails. Do not apply lotion between your toes.  Trim your toenails straight across. Do not dig under them or around the cuticle. File the edges of your nails with an emery board or nail file.  Do not cut corns or calluses or try to remove them with medicine.  Wear clean socks or stockings every day. Make sure they are not too tight. Do not wear knee-high stockings since they may decrease blood flow to your legs.  Wear shoes that fit properly and have enough cushioning. To break in new shoes, wear them for just a few hours a day. This prevents you from injuring your feet. Always look in your shoes before you put them on to be sure there are no objects inside.  Do not cross your legs. This may decrease the blood flow to your feet.  If you find a minor scrape,  cut, or break in the skin on your feet, keep it and the skin around it clean and dry. These areas may be cleansed with mild soap and water. Do not cleanse the area with peroxide, alcohol, or iodine.  When you remove an adhesive bandage, be sure not to damage the skin around it.  If you have a wound, look at it several times a day to make sure it is healing.  Do not use heating pads or hot water bottles. They may burn your skin. If you have lost feeling in your feet or legs, you may not know it is happening until it is too late.  Make sure your health care provider performs a complete foot exam at least annually or more often if you have foot problems. Report any cuts, sores, or bruises to your health care provider immediately. SEEK MEDICAL CARE IF:   You have an injury that is not healing.  You have cuts or breaks in the skin.  You have an ingrown nail.  You notice redness on your legs or feet.  You feel burning or tingling in your legs or feet.  You have pain or cramps in your legs and feet.  Your legs or feet are numb.  Your feet always feel cold. SEEK IMMEDIATE MEDICAL CARE IF:   There is increasing redness,   swelling, or pain in or around a wound.  There is a red line that goes up your leg.  Pus is coming from a wound.  You develop a fever or as directed by your health care provider.  You notice a bad smell coming from an ulcer or wound.   This information is not intended to replace advice given to you by your health care provider. Make sure you discuss any questions you have with your health care provider.   Document Released: 05/01/2000 Document Revised: 01/04/2013 Document Reviewed: 10/11/2012 Elsevier Interactive Patient Education 2016 [employer]  

## 2015-09-05 NOTE — Progress Notes (Signed)
Patient ID: Angela Pugh, female   DOB: [birth year], 70 y.o.   MRN: [id number]  Subjective: This patient presents today complaining of thickened and elongated toenails which are uncomfortable walking wearing shoes and requests toenail debridement. Also, patient is complaining of extremely painful plantar keratoses on the right and left feet. Patient last presented for similar service on 02/26/2015   Chart review Chart note written by Dr. Harriet Masson on 05/29/2014   Harriet Masson DPM  05/29/2014   ssessment diabetes with peripheral neuropathy and angiopathy complications painful mycotic nails debrided 10 keratoses are debrided as well bilateral this time dispensed diabetic shoes and custom molded insoles shoes and insoles fit and contour well to the foot written instructions for shoewear break in are given follow-up in 3 months for continued palliative care in the future as needed next             Orientated 3 No open skin lesions bilaterally The toenails are elongated, brittle, deformed, discolored and tender directly palpation 6-10 Large nucleated plantar keratoses sub-third MPJ bilaterally Small keratoses plantar left hallux  Assessment: Symptomatic onychomycoses 6-10 Porokeratosis 3 Diabetic with a history of neuropathy  Plan: Debridement toenails 6-10 mechanically and electronically without any bleeding Debride the porokeratosis 3 without any bleeding. These areas were packed with salinocaine and patient was advised to leave bandages on if possible 3-5 days  Reappoint 3 months

## 2015-12-04 ENCOUNTER — Ambulatory Visit (INDEPENDENT_AMBULATORY_CARE_PROVIDER_SITE_OTHER): Payer: Medicare Other | Admitting: Podiatry

## 2015-12-04 ENCOUNTER — Encounter: Payer: Self-pay | Admitting: Podiatry

## 2015-12-04 DIAGNOSIS — E0842 Diabetes mellitus due to underlying condition with diabetic polyneuropathy: Secondary | ICD-10-CM

## 2015-12-04 DIAGNOSIS — B351 Tinea unguium: Secondary | ICD-10-CM | POA: Diagnosis not present

## 2015-12-04 DIAGNOSIS — M79675 Pain in left toe(s): Secondary | ICD-10-CM

## 2015-12-04 DIAGNOSIS — M79674 Pain in right toe(s): Secondary | ICD-10-CM | POA: Diagnosis not present

## 2015-12-04 DIAGNOSIS — Q828 Other specified congenital malformations of skin: Secondary | ICD-10-CM

## 2015-12-04 NOTE — Patient Instructions (Signed)
Diabetes and Foot Care Diabetes may cause you to have problems because of poor blood supply (circulation) to your feet and legs. This may cause the skin on your feet to become thinner, break easier, and heal more slowly. Your skin may become dry, and the skin may peel and crack. You may also have nerve damage in your legs and feet causing decreased feeling in them. You may not notice minor injuries to your feet that could lead to infections or more serious problems. Taking care of your feet is one of the most important things you can do for yourself.  HOME CARE INSTRUCTIONS  Wear shoes at all times, even in the house. Do not go barefoot. Bare feet are easily injured.  Check your feet daily for blisters, cuts, and redness. If you cannot see the bottom of your feet, use a mirror or ask someone for help.  Wash your feet with warm water (do not use hot water) and mild soap. Then pat your feet and the areas between your toes until they are completely dry. Do not soak your feet as this can dry your skin.  Apply a moisturizing lotion or petroleum jelly (that does not contain alcohol and is unscented) to the skin on your feet and to dry, brittle toenails. Do not apply lotion between your toes.  Trim your toenails straight across. Do not dig under them or around the cuticle. File the edges of your nails with an emery board or nail file.  Do not cut corns or calluses or try to remove them with medicine.  Wear clean socks or stockings every day. Make sure they are not too tight. Do not wear knee-high stockings since they may decrease blood flow to your legs.  Wear shoes that fit properly and have enough cushioning. To break in new shoes, wear them for just a few hours a day. This prevents you from injuring your feet. Always look in your shoes before you put them on to be sure there are no objects inside.  Do not cross your legs. This may decrease the blood flow to your feet.  If you find a minor scrape,  cut, or break in the skin on your feet, keep it and the skin around it clean and dry. These areas may be cleansed with mild soap and water. Do not cleanse the area with peroxide, alcohol, or iodine.  When you remove an adhesive bandage, be sure not to damage the skin around it.  If you have a wound, look at it several times a day to make sure it is healing.  Do not use heating pads or hot water bottles. They may burn your skin. If you have lost feeling in your feet or legs, you may not know it is happening until it is too late.  Make sure your health care provider performs a complete foot exam at least annually or more often if you have foot problems. Report any cuts, sores, or bruises to your health care provider immediately. SEEK MEDICAL CARE IF:   You have an injury that is not healing.  You have cuts or breaks in the skin.  You have an ingrown nail.  You notice redness on your legs or feet.  You feel burning or tingling in your legs or feet.  You have pain or cramps in your legs and feet.  Your legs or feet are numb.  Your feet always feel cold. SEEK IMMEDIATE MEDICAL CARE IF:   There is increasing redness,   swelling, or pain in or around a wound.  There is a red line that goes up your leg.  Pus is coming from a wound.  You develop a fever or as directed by your health care provider.  You notice a bad smell coming from an ulcer or wound.   This information is not intended to replace advice given to you by your health care provider. Make sure you discuss any questions you have with your health care provider.   Document Released: 05/01/2000 Document Revised: 01/04/2013 Document Reviewed: 10/11/2012 Elsevier Interactive Patient Education 2016 [employer]  

## 2015-12-05 NOTE — Progress Notes (Signed)
Patient ID: Angela Pugh, female   DOB: 1946/05/13, 70 y.o.   MRN: [id number]  Subjective: This patient presents for scheduled visit complaining of uncomfortable toenails walking wearing shoes and requests toenail debridement. Also, patient is complaining of multiple painful plantar calluses on the right and left feet and requests debridement  Patient is a diabetic with a history of neuropathy  Objective: No open skin lesions bilaterally The toenails elongated, brittle, deformed, discolored tender to direct palpation 6-10 Nucleated plantar keratoses third MPJ bilaterally and hallux  Assessment: Symptomatic onychomycoses 6-10 Porokeratosis 3  Plan: Debridement toenails 6-10 mechanically and electrically without any bleeding Debride porokeratosis 3 without any bleeding and apply salinocaine   Reappoint 3 months

## 2016-01-13 DIAGNOSIS — G5603 Carpal tunnel syndrome, bilateral upper limbs: Secondary | ICD-10-CM | POA: Insufficient documentation

## 2016-01-13 DIAGNOSIS — M47812 Spondylosis without myelopathy or radiculopathy, cervical region: Secondary | ICD-10-CM | POA: Insufficient documentation

## 2016-02-01 ENCOUNTER — Inpatient Hospital Stay (HOSPITAL_COMMUNITY)
Admission: EM | Admit: 2016-02-01 | Discharge: 2016-02-03 | DRG: 392 | Disposition: A | Discharge status: Home / Self Care | Payer: Medicare Other | Attending: Family Medicine | Admitting: Family Medicine

## 2016-02-01 ENCOUNTER — Encounter (HOSPITAL_COMMUNITY): Payer: Self-pay | Admitting: Emergency Medicine

## 2016-02-01 ENCOUNTER — Emergency Department (HOSPITAL_COMMUNITY): Payer: Medicare Other

## 2016-02-01 DIAGNOSIS — Z7982 Long term (current) use of aspirin: Secondary | ICD-10-CM

## 2016-02-01 DIAGNOSIS — K5900 Constipation, unspecified: Secondary | ICD-10-CM | POA: Diagnosis present

## 2016-02-01 DIAGNOSIS — K5732 Diverticulitis of large intestine without perforation or abscess without bleeding: Secondary | ICD-10-CM | POA: Diagnosis not present

## 2016-02-01 DIAGNOSIS — M549 Dorsalgia, unspecified: Secondary | ICD-10-CM | POA: Diagnosis present

## 2016-02-01 DIAGNOSIS — Z833 Family history of diabetes mellitus: Secondary | ICD-10-CM

## 2016-02-01 DIAGNOSIS — R1032 Left lower quadrant pain: Secondary | ICD-10-CM | POA: Diagnosis not present

## 2016-02-01 DIAGNOSIS — R109 Unspecified abdominal pain: Secondary | ICD-10-CM | POA: Diagnosis present

## 2016-02-01 DIAGNOSIS — K5792 Diverticulitis of intestine, part unspecified, without perforation or abscess without bleeding: Secondary | ICD-10-CM | POA: Diagnosis not present

## 2016-02-01 DIAGNOSIS — G8929 Other chronic pain: Secondary | ICD-10-CM | POA: Diagnosis present

## 2016-02-01 DIAGNOSIS — Z981 Arthrodesis status: Secondary | ICD-10-CM

## 2016-02-01 DIAGNOSIS — Z87891 Personal history of nicotine dependence: Secondary | ICD-10-CM

## 2016-02-01 DIAGNOSIS — Z79891 Long term (current) use of opiate analgesic: Secondary | ICD-10-CM

## 2016-02-01 DIAGNOSIS — E785 Hyperlipidemia, unspecified: Secondary | ICD-10-CM | POA: Diagnosis present

## 2016-02-01 DIAGNOSIS — I10 Essential (primary) hypertension: Secondary | ICD-10-CM | POA: Diagnosis not present

## 2016-02-01 DIAGNOSIS — Z886 Allergy status to analgesic agent status: Secondary | ICD-10-CM

## 2016-02-01 DIAGNOSIS — E876 Hypokalemia: Secondary | ICD-10-CM | POA: Diagnosis present

## 2016-02-01 DIAGNOSIS — M5126 Other intervertebral disc displacement, lumbar region: Secondary | ICD-10-CM | POA: Diagnosis present

## 2016-02-01 DIAGNOSIS — E119 Type 2 diabetes mellitus without complications: Secondary | ICD-10-CM

## 2016-02-01 DIAGNOSIS — E1142 Type 2 diabetes mellitus with diabetic polyneuropathy: Secondary | ICD-10-CM | POA: Diagnosis present

## 2016-02-01 DIAGNOSIS — Z808 Family history of malignant neoplasm of other organs or systems: Secondary | ICD-10-CM

## 2016-02-01 DIAGNOSIS — E860 Dehydration: Secondary | ICD-10-CM | POA: Diagnosis present

## 2016-02-01 HISTORY — DX: Polyneuropathy, unspecified: G62.9

## 2016-02-01 HISTORY — DX: Other chronic pain: G89.29

## 2016-02-01 HISTORY — DX: Hyperlipidemia, unspecified: E78.5

## 2016-02-01 HISTORY — DX: Dizziness and giddiness: R42

## 2016-02-01 HISTORY — DX: Diverticulosis of intestine, part unspecified, without perforation or abscess without bleeding: K57.90

## 2016-02-01 HISTORY — DX: Dorsalgia, unspecified: M54.9

## 2016-02-01 HISTORY — DX: Radiculopathy, site unspecified: M54.10

## 2016-02-01 LAB — URINALYSIS, ROUTINE W REFLEX MICROSCOPIC
BILIRUBIN URINE: NEGATIVE
Glucose, UA: NEGATIVE mg/dL
Ketones, ur: NEGATIVE mg/dL
Leukocytes, UA: NEGATIVE
NITRITE: NEGATIVE
PH: 7 (ref 5.0–8.0)
Protein, ur: NEGATIVE mg/dL
SPECIFIC GRAVITY, URINE: 1.02 (ref 1.005–1.030)

## 2016-02-01 LAB — CBC WITH DIFFERENTIAL/PLATELET
Basophils Absolute: 0 10*3/uL (ref 0.0–0.1)
Basophils Relative: 0 %
EOS ABS: 0.1 10*3/uL (ref 0.0–0.7)
Eosinophils Relative: 1 %
HEMATOCRIT: 30.5 % — AB (ref 36.0–46.0)
HEMOGLOBIN: 9.7 g/dL — AB (ref 12.0–15.0)
LYMPHS ABS: 2.3 10*3/uL (ref 0.7–4.0)
LYMPHS PCT: 30 %
MCH: 27.3 pg (ref 26.0–34.0)
MCHC: 31.8 g/dL (ref 30.0–36.0)
MCV: 85.9 fL (ref 78.0–100.0)
MONOS PCT: 9 %
Monocytes Absolute: 0.7 10*3/uL (ref 0.1–1.0)
NEUTROS ABS: 4.6 10*3/uL (ref 1.7–7.7)
NEUTROS PCT: 60 %
Platelets: 370 10*3/uL (ref 150–400)
RBC: 3.55 MIL/uL — AB (ref 3.87–5.11)
RDW: 15.1 % (ref 11.5–15.5)
WBC: 7.6 10*3/uL (ref 4.0–10.5)

## 2016-02-01 LAB — COMPREHENSIVE METABOLIC PANEL
ALBUMIN: 3.4 g/dL — AB (ref 3.5–5.0)
ALK PHOS: 58 U/L (ref 38–126)
ALT: 12 U/L — ABNORMAL LOW (ref 14–54)
ANION GAP: 8 (ref 5–15)
AST: 13 U/L — ABNORMAL LOW (ref 15–41)
BUN: 12 mg/dL (ref 6–20)
CALCIUM: 8.9 mg/dL (ref 8.9–10.3)
CO2: 26 mmol/L (ref 22–32)
Chloride: 105 mmol/L (ref 101–111)
Creatinine, Ser: 0.72 mg/dL (ref 0.44–1.00)
GFR calc Af Amer: >60 mL/min (ref >60)
GFR calc non Af Amer: >60 mL/min (ref >60)
GLUCOSE: 109 mg/dL — AB (ref 65–99)
POTASSIUM: 2.9 mmol/L — AB (ref 3.5–5.1)
SODIUM: 139 mmol/L (ref 135–145)
TOTAL PROTEIN: 7.4 g/dL (ref 6.5–8.1)
Total Bilirubin: 0.8 mg/dL (ref 0.3–1.2)

## 2016-02-01 LAB — GLUCOSE, CAPILLARY: Glucose-Capillary: 108 mg/dL — ABNORMAL HIGH (ref 65–99)

## 2016-02-01 LAB — URINE MICROSCOPIC-ADD ON
Bacteria, UA: NONE SEEN
WBC UA: NONE SEEN WBC/hpf (ref 0–5)

## 2016-02-01 LAB — LIPASE, BLOOD: Lipase: 30 U/L (ref 11–51)

## 2016-02-01 LAB — LACTIC ACID, PLASMA
LACTIC ACID, VENOUS: 0.9 mmol/L (ref 0.5–1.9)
Lactic Acid, Venous: 1.1 mmol/L (ref 0.5–1.9)

## 2016-02-01 LAB — MAGNESIUM: MAGNESIUM: 1.8 mg/dL (ref 1.7–2.4)

## 2016-02-01 MED ORDER — COLESEVELAM HCL 625 MG PO TABS
625.0000 mg | ORAL_TABLET | Freq: Every day | ORAL | Status: DC
  Administered 2016-02-02: 625 mg via ORAL
  Filled 2016-02-01 (×2): qty 1

## 2016-02-01 MED ORDER — SODIUM CHLORIDE 0.9 % IV SOLN
INTRAVENOUS | Status: DC
  Administered 2016-02-01 – 2016-02-02 (×2): via INTRAVENOUS

## 2016-02-01 MED ORDER — FLUOCINONIDE 0.05 % EX CREA: TOPICAL_CREAM | Freq: Two times a day (BID) | CUTANEOUS | Status: DC | PRN

## 2016-02-01 MED ORDER — SODIUM CHLORIDE 0.9 % IV BOLUS (SEPSIS)
1000.0000 mL | Freq: Once | INTRAVENOUS | Status: AC
  Administered 2016-02-01: 1000 mL via INTRAVENOUS

## 2016-02-01 MED ORDER — HYDROCODONE-IBUPROFEN 5-200 MG PO TABS: 1.0000 | ORAL_TABLET | Freq: Four times a day (QID) | ORAL | Status: DC | PRN

## 2016-02-01 MED ORDER — METRONIDAZOLE IN NACL 5-0.79 MG/ML-% IV SOLN
500.0000 mg | Freq: Once | INTRAVENOUS | Status: AC
  Administered 2016-02-01: 500 mg via INTRAVENOUS
  Filled 2016-02-01: qty 100

## 2016-02-01 MED ORDER — IOPAMIDOL (ISOVUE-300) INJECTION 61%
15.0000 mL | Freq: Once | INTRAVENOUS | Status: AC | PRN
  Administered 2016-02-01: 15 mL via ORAL

## 2016-02-01 MED ORDER — DOCUSATE SODIUM 100 MG PO CAPS: 100.0000 mg | ORAL_CAPSULE | Freq: Two times a day (BID) | ORAL | Status: DC | PRN

## 2016-02-01 MED ORDER — CIPROFLOXACIN IN D5W 400 MG/200ML IV SOLN
400.0000 mg | Freq: Two times a day (BID) | INTRAVENOUS | Status: DC
  Administered 2016-02-01 – 2016-02-03 (×4): 400 mg via INTRAVENOUS
  Filled 2016-02-01 (×4): qty 200

## 2016-02-01 MED ORDER — MORPHINE SULFATE (PF) 2 MG/ML IV SOLN
1.0000 mg | INTRAVENOUS | Status: AC | PRN
  Administered 2016-02-01: 1 mg via INTRAVENOUS
  Filled 2016-02-01: qty 1

## 2016-02-01 MED ORDER — ENOXAPARIN SODIUM 40 MG/0.4ML ~~LOC~~ SOLN
40.0000 mg | SUBCUTANEOUS | Status: DC
  Filled 2016-02-01: qty 0.4

## 2016-02-01 MED ORDER — MORPHINE SULFATE (PF) 2 MG/ML IV SOLN
1.0000 mg | INTRAVENOUS | Status: AC | PRN
  Administered 2016-02-02: 1 mg via INTRAVENOUS
  Filled 2016-02-01: qty 1

## 2016-02-01 MED ORDER — SODIUM CHLORIDE 0.9 % IV SOLN: INTRAVENOUS | Status: AC

## 2016-02-01 MED ORDER — IBUPROFEN 200 MG PO TABS: 200.0000 mg | ORAL_TABLET | Freq: Four times a day (QID) | ORAL | Status: DC | PRN

## 2016-02-01 MED ORDER — POTASSIUM CHLORIDE 20 MEQ/15ML (10%) PO SOLN
40.0000 meq | Freq: Once | ORAL | Status: AC
  Administered 2016-02-01: 40 meq via ORAL

## 2016-02-01 MED ORDER — MORPHINE SULFATE (PF) 4 MG/ML IV SOLN
4.0000 mg | INTRAVENOUS | Status: DC | PRN
Start: 2016-02-01 — End: 2016-02-01
  Administered 2016-02-01: 4 mg via INTRAVENOUS
  Filled 2016-02-01: qty 1

## 2016-02-01 MED ORDER — CIPROFLOXACIN IN D5W 400 MG/200ML IV SOLN
400.0000 mg | Freq: Once | INTRAVENOUS | Status: DC
  Administered 2016-02-01: 400 mg via INTRAVENOUS
  Filled 2016-02-01: qty 200

## 2016-02-01 MED ORDER — ONDANSETRON HCL 4 MG/2ML IJ SOLN
4.0000 mg | Freq: Three times a day (TID) | INTRAMUSCULAR | Status: AC | PRN
  Administered 2016-02-01: 4 mg via INTRAVENOUS

## 2016-02-01 MED ORDER — LOSARTAN POTASSIUM 50 MG PO TABS
100.0000 mg | ORAL_TABLET | Freq: Every day | ORAL | Status: DC
  Administered 2016-02-01 – 2016-02-02 (×2): 100 mg via ORAL
  Filled 2016-02-01 (×3): qty 2

## 2016-02-01 MED ORDER — HYDROMORPHONE HCL 1 MG/ML IJ SOLN: 0.5000 mg | INTRAMUSCULAR | Status: DC | PRN

## 2016-02-01 MED ORDER — SIMETHICONE 80 MG PO CHEW
80.0000 mg | CHEWABLE_TABLET | Freq: Every day | ORAL | Status: DC | PRN
  Administered 2016-02-03: 80 mg via ORAL
  Filled 2016-02-01: qty 1

## 2016-02-01 MED ORDER — ONDANSETRON HCL 4 MG/2ML IJ SOLN
4.0000 mg | INTRAMUSCULAR | Status: AC | PRN
Start: 2016-02-01 — End: 2016-02-03
  Administered 2016-02-01 – 2016-02-03 (×2): 4 mg via INTRAVENOUS
  Filled 2016-02-01 (×3): qty 2

## 2016-02-01 MED ORDER — AMLODIPINE BESYLATE 5 MG PO TABS
5.0000 mg | ORAL_TABLET | Freq: Every day | ORAL | Status: DC
  Administered 2016-02-01: 5 mg via ORAL
  Filled 2016-02-01 (×3): qty 1

## 2016-02-01 MED ORDER — OXYCODONE HCL 5 MG PO TABS
5.0000 mg | ORAL_TABLET | Freq: Four times a day (QID) | ORAL | Status: DC | PRN
  Administered 2016-02-02 (×2): 5 mg via ORAL
  Filled 2016-02-01 (×3): qty 1

## 2016-02-01 MED ORDER — POTASSIUM CHLORIDE CRYS ER 20 MEQ PO TBCR
40.0000 meq | EXTENDED_RELEASE_TABLET | Freq: Once | ORAL | Status: AC
  Administered 2016-02-01: 40 meq via ORAL
  Filled 2016-02-01: qty 2

## 2016-02-01 MED ORDER — HYDRALAZINE HCL 20 MG/ML IJ SOLN: 5.0000 mg | INTRAMUSCULAR | Status: DC | PRN

## 2016-02-01 MED ORDER — IOPAMIDOL (ISOVUE-300) INJECTION 61%
100.0000 mL | Freq: Once | INTRAVENOUS | Status: AC | PRN
  Administered 2016-02-01: 100 mL via INTRAVENOUS

## 2016-02-01 MED ORDER — METRONIDAZOLE IN NACL 5-0.79 MG/ML-% IV SOLN
500.0000 mg | Freq: Three times a day (TID) | INTRAVENOUS | Status: DC
  Administered 2016-02-01 – 2016-02-03 (×6): 500 mg via INTRAVENOUS
  Filled 2016-02-01 (×6): qty 100

## 2016-02-01 NOTE — Progress Notes (Addendum)
Notification: Pt refused 1300 Welchol, stated she was afraid it would "upset her stomach". Thomasene Lot, RN

## 2016-02-01 NOTE — ED Notes (Signed)
WILL TRANSPORT PT TO 5W 1522-1. AAOX4. PT IN NO APPARENT DISTRESS. THE OPPORTUNITY TO ASK QUESTIONS WAS PROVIDED.

## 2016-02-01 NOTE — ED Triage Notes (Signed)
Per pt, states she feels like her insides about to come out-last normal BM was Sunday/Monday-no N/V

## 2016-02-01 NOTE — Progress Notes (Signed)
Pharmacy Antibiotic Follow-up Note  LAZIYAH KWIECINSKI is a 70 y.o. year-old female admitted on 02/01/2016.  The patient is currently on day 1 of Cipro and Flagyl for sigmoid diverticulitis (abd CT)  Assessment/Plan: This patient's current antibiotics will be continued without adjustments.  Cipro 400mg  IV q12 Flagyl 500mg  IV q8 abx to PO when able to tolerate  Temp (24hrs), Avg:98.6 F (37 C), Min:98.4 F (36.9 C), Max:98.8 F (37.1 C)   Recent Labs Lab 02/01/16 1003  WBC 7.6    Recent Labs Lab 02/01/16 0825  CREATININE 0.72   CrCl cannot be calculated (Unknown ideal weight.).    Allergies  Allergen Reactions  . Acetaminophen Hives and Nausea Only  . Atorvastatin Nausea Only    weakness   Antimicrobials this admission: 9/16 Flagyl >>  9/16 Cipro >>  Microbiology results: None ordered  Thank you for allowing pharmacy to be a part of this patient's care.  Minda Ditto PharmD 02/01/2016 11:07 AM

## 2016-02-01 NOTE — ED Notes (Signed)
Pt can go to floor at 12:03.

## 2016-02-01 NOTE — ED Provider Notes (Signed)
[facility] DEPT Provider Note   CSN: [id number] Arrival date & time: 02/01/16  0750     History   Chief Complaint Chief Complaint  Patient presents with  . Abdominal Pain    HPI Angela Pugh is a 70 y.o. female.   Abdominal Pain     Pt was seen at 0825.  Per pt, c/o gradual onset and persistence of worsening LLQ abd "pain" since for the past 5 days, worse since yesterday.  Has been associated with no other symptoms.  Describes the abd pain as "aching."  Last BM approximately 5 days ago. Denies N/V, no diarrhea, no fevers, no back pain, no rash, no CP/SOB, no black or blood in stools.       Past Medical History:  Diagnosis Date  . Arthritis    back, knees, shoulder, hands , injections in pelvis, for bone spurs  . Chronic back pain   . Diabetes mellitus    diet controlled, no meds now, was on insulin & po treatment at one time    . Diverticulosis   . Headache(784.0)    occas. relative to sinuses   . Hypertension   . Peripheral neuropathy ([place])   . Radiculopathy   . Sinus complaint   . Vertigo     Patient Active Problem List   Diagnosis Date Noted  . Herniated lumbar intervertebral disc 06/30/2013    Class: Acute    Past Surgical History:  Procedure Laterality Date  . ABDOMINAL HYSTERECTOMY    . ANTERIOR FUSION CERVICAL SPINE  2000  . bone spurs     removed  . LUMBAR LAMINECTOMY Left 06/30/2013   Procedure: Left L3-4 Extraforaminal approach to excise far lateral herniated nucleus pulposus;  Surgeon: Jessy Oto, MD;  Location: [facility];  Service: Orthopedics;  Laterality: Left;       Home Medications    Prior to Admission medications   Medication Sig Start Date End Date Taking? Authorizing Provider  amLODipine (NORVASC) 5 MG tablet Take 5 mg by mouth daily. 11/26/15  Yes Historical Provider, MD  Aspirin-Acetaminophen-Caffeine (GOODY HEADACHE PO) Take 1 packet by mouth daily as needed (pain).   Yes Historical Provider, MD  colesevelam (WELCHOL) 625  MG tablet Take 625 mg by mouth daily.    Yes Historical Provider, MD  fluocinonide cream (LIDEX) 0.05 % APP AA BID as needed for rash 02/19/15  Yes Historical Provider, MD  hydrocodone-ibuprofen (VICOPROFEN) 5-200 MG tablet Take 1 tablet by mouth every 6 (six) hours as needed for pain.  12/30/15  Yes Historical Provider, MD  losartan-hydrochlorothiazide (HYZAAR) 100-25 MG per tablet Take 1 tablet by mouth daily.  04/04/13  Yes Historical Provider, MD  Simethicone (GAS-X PO) Take 1 tablet by mouth daily as needed (gas).   Yes Historical Provider, MD    Family History No family history on file.  Social History Social History  Substance Use Topics  . Smoking status: Former Research scientist (life sciences)  . Smokeless tobacco: Former Systems developer    Quit date: 06/30/1982  . Alcohol use Yes     Comment: ocass. - social      Allergies   Acetaminophen and Atorvastatin   Review of Systems Review of Systems  Gastrointestinal: Positive for abdominal pain.  ROS: Statement: All systems negative except as marked or noted in the HPI; Constitutional: Negative for fever and chills. ; ; Eyes: Negative for eye pain, redness and discharge. ; ; ENMT: Negative for ear pain, hoarseness, nasal congestion, sinus pressure and sore throat. ; ;  Cardiovascular: Negative for chest pain, palpitations, diaphoresis, dyspnea and peripheral edema. ; ; Respiratory: Negative for cough, wheezing and stridor. ; ; Gastrointestinal: +abd pain. Negative for nausea, vomiting, diarrhea, blood in stool, hematemesis, jaundice and rectal bleeding. . ; ; Genitourinary: Negative for dysuria, flank pain and hematuria. ; ; Musculoskeletal: Negative for back pain and neck pain. Negative for swelling and trauma.; ; Skin: Negative for pruritus, rash, abrasions, blisters, bruising and skin lesion.; ; Neuro: Negative for headache, lightheadedness and neck stiffness. Negative for weakness, altered level of consciousness, altered mental status, extremity weakness, paresthesias,  involuntary movement, seizure and syncope.       Physical Exam Updated Vital Signs BP 171/72 (BP Location: Left Arm)   Pulse 64   Temp 98.8 F (37.1 C) (Oral)   Resp 18   SpO2 100%   Physical Exam 0830: Physical examination:  Nursing notes reviewed; Vital signs and O2 SAT reviewed;  Constitutional: Well developed, Well nourished, Well hydrated, In no acute distress; Head:  Normocephalic, atraumatic; Eyes: EOMI, PERRL, No scleral icterus; ENMT: Mouth and pharynx normal, Mucous membranes moist; Neck: Supple, Full range of motion, No lymphadenopathy; Cardiovascular: Regular rate and rhythm, No gallop; Respiratory: Breath sounds clear & equal bilaterally, No wheezes.  Speaking full sentences with ease, Normal respiratory effort/excursion; Chest: Nontender, Movement normal; Abdomen: Soft, +RLQ and LLQ tenderness to palp. No rebound or guarding. Nondistended, Normal bowel sounds; Genitourinary: No CVA tenderness; Extremities: Pulses normal, No tenderness, No edema, No calf edema or asymmetry.; Neuro: AA&Ox3, Major CN grossly intact.  Speech clear. No gross focal motor or sensory deficits in extremities.; Skin: Color normal, Warm, Dry.    ED Treatments / Results  Labs (all labs ordered are listed, but only abnormal results are displayed)   EKG  EKG Interpretation None       Radiology   Procedures Procedures (including critical care time)  Medications Ordered in ED Medications  0.9 %  sodium chloride infusion ( Intravenous New Bag/Given 02/01/16 0855)  morphine 4 MG/ML injection 4 mg (4 mg Intravenous Given 02/01/16 0856)  ondansetron (ZOFRAN) injection 4 mg (4 mg Intravenous Given 02/01/16 0855)  iopamidol (ISOVUE-300) 61 % injection 15 mL (15 mLs Oral Contrast Given 02/01/16 0901)  iopamidol (ISOVUE-300) 61 % injection 100 mL (100 mLs Intravenous Contrast Given 02/01/16 0912)     Initial Impression / Assessment and Plan / ED Course  I have reviewed the triage vital signs and the  nursing notes.  Pertinent labs & imaging results that were available during my care of the patient were reviewed by me and considered in my medical decision making (see chart for details).  MDM Reviewed: previous chart, nursing note and vitals Reviewed previous: labs Interpretation: labs, x-ray and CT scan   Results for orders placed or performed during the hospital encounter of 02/01/16  Lipase, blood  Result Value Ref Range   Lipase 30 11 - 51 U/L  Comprehensive metabolic panel  Result Value Ref Range   Sodium 139 135 - 145 mmol/L   Potassium 2.9 (L) 3.5 - 5.1 mmol/L   Chloride 105 101 - 111 mmol/L   CO2 26 22 - 32 mmol/L   Glucose, Bld 109 (H) 65 - 99 mg/dL   BUN 12 6 - 20 mg/dL   Creatinine, Ser 0.72 0.44 - 1.00 mg/dL   Calcium 8.9 8.9 - 10.3 mg/dL   Total Protein 7.4 6.5 - 8.1 g/dL   Albumin 3.4 (L) 3.5 - 5.0 g/dL   AST 13 (L)  15 - 41 U/L   ALT 12 (L) 14 - 54 U/L   Alkaline Phosphatase 58 38 - 126 U/L   Total Bilirubin 0.8 0.3 - 1.2 mg/dL   GFR calc non Af Amer >60 >60 mL/min   GFR calc Af Amer >60 >60 mL/min   Anion gap 8 5 - 15  CBC with Differential  Result Value Ref Range   WBC 7.6 4.0 - 10.5 K/uL   RBC 3.55 (L) 3.87 - 5.11 MIL/uL   Hemoglobin 9.7 (L) 12.0 - 15.0 g/dL   HCT 30.5 (L) 36.0 - 46.0 %   MCV 85.9 78.0 - 100.0 fL   MCH 27.3 26.0 - 34.0 pg   MCHC 31.8 30.0 - 36.0 g/dL   RDW 15.1 11.5 - 15.5 %   Platelets 370 150 - 400 K/uL   Neutrophils Relative % 60 %   Neutro Abs 4.6 1.7 - 7.7 K/uL   Lymphocytes Relative 30 %   Lymphs Abs 2.3 0.7 - 4.0 K/uL   Monocytes Relative 9 %   Monocytes Absolute 0.7 0.1 - 1.0 K/uL   Eosinophils Relative 1 %   Eosinophils Absolute 0.1 0.0 - 0.7 K/uL   Basophils Relative 0 %   Basophils Absolute 0.0 0.0 - 0.1 K/uL   Ct Abdomen Pelvis W Contrast Result Date: 02/01/2016 CLINICAL DATA:  Patient feels like her in sides are coming out. No nausea or vomiting. EXAM: CT ABDOMEN AND PELVIS WITH CONTRAST TECHNIQUE: Multidetector  CT imaging of the abdomen and pelvis was performed using the standard protocol following bolus administration of intravenous contrast. CONTRAST:  17mL ISOVUE-300 IOPAMIDOL (ISOVUE-300) INJECTION 61%, 136mL ISOVUE-300 IOPAMIDOL (ISOVUE-300) INJECTION 61% COMPARISON:  Lumbar spine MRI 02/03/2015 ; CT abdomen pelvis 06/23/2013. FINDINGS: Lower chest: The heart is prominent in size. Minimal subpleural scarring and or atelectasis within the lower lobes bilaterally. No pleural effusion. Hepatobiliary: Liver is normal in size and contour. Grossly unchanged cysts within the liver measuring up to 3.6 cm near the hepatic dome (image 15; series 2). Pancreas: Unremarkable Spleen: Unremarkable Adrenals/Urinary Tract: Normal adrenal glands. Kidneys enhance symmetrically with contrast. No hydronephrosis. Urinary bladder is unremarkable. Stomach/Bowel: Circumferential wall thickening and surrounding fat stranding about the sigmoid colon (image 66; series 2), most compatible with acute sigmoid diverticulitis. Suggestion of small intramural fluid collection within the sigmoid colon measuring 12 mm (image 45; series 5). No evidence for perforation. Normal morphology of the stomach. No free intraperitoneal air. No evidence for perforation. Vascular/Lymphatic: Peripheral calcified atherosclerotic plaque involving the abdominal aorta. Infrarenal abdominal aortic ectasia measuring 2.4 cm. No retroperitoneal lymphadenopathy. Reproductive: Patient status post hysterectomy. Other: None. Musculoskeletal: Lower thoracic and lumbar spine degenerative changes. No aggressive or acute appearing osseous lesions. Bilateral hip joint degenerative changes. IMPRESSION: Circumferential wall thickening and surrounding fat stranding about the sigmoid colon suggestive of acute sigmoid diverticulitis. Given the overall imaging appearance, if not previously performed, recommend correlation with colonoscopy after resolution of the acute symptomatology to  exclude underlying mass. Aortic atherosclerosis. Electronically Signed   By: Lovey Newcomer M.D.   On: 02/01/2016 09:35    1040:  CT scan as above. IV cipro/flagyl ordered. Potassium repleted PO. Dx and testing d/w pt and family.  Questions answered.  Verb understanding, agreeable to admit.  T/C to [facility] Dr. Blaine Hamper, case discussed, including:  HPI, pertinent PM/SHx, VS/PE, dx testing, ED course and treatment:  Agreeable to admit, requests to write temporary orders, obtain observation medical bed to team APAdmits.     Final Clinical  Impressions(s) / ED Diagnoses   Final diagnoses:  None    New Prescriptions New Prescriptions   No medications on file      Francine Graven, DO 02/05/16 1645

## 2016-02-01 NOTE — H&P (Signed)
History and Physical    Angela Pugh [id number] DOB: 1945-12-02 DOA: 02/01/2016  Referring MD/NP/PA:   PCP: Patricia Nettle, MD   Patient coming from:  The patient is coming from home.  At baseline, pt is independent for most of ADL.   Chief Complaint: Abdominal pain  HPI: Angela Pugh is a 70 y.o. female with medical history significant of diverticulosis, hypertension, hyperlipidemia, diet-controlled diabetes, chronic back pain, vertigo, peripheral neuropathy, who presents with abdominal pain.  Patient states that she has been having abdominal pain for 5 days. Her abdominal pain is located in the left lower quadrant, sharp, constant, 10 out of 10 severity, nonradiating. It is not aggravated or alleviated by any known factors. Patient denies fever, chills, nausea, vomiting, diarrhea. She has generalized weakness, and poor oral intake. She is mildly constipated. Patient denies symptoms of UTI. No unilateral weakness. Her home oral pain medication, vicoprofen is not effectcive.   ED Course: pt was found to have WBC 7.6, temperature normal, no tachycardia, no tachypnea, lactate 1.1, pending urinalysis, potassium 2.9, creatinine normal. CT abdomen/pelvis showed acute sigmoid diverticulitis. Pt is placed on med-surg bed for obs.  Review of Systems:   General: no fevers, chills, no changes in body weight, has poor appetite, has fatigue HEENT: no blurry vision, hearing changes or sore throat Respiratory: no dyspnea, coughing, wheezing CV: no chest pain, no palpitations GI: has abdominal pain, no nausea, vomiting, diarrhea, has constipation GU: no dysuria, burning on urination, increased urinary frequency, hematuria  Ext: no leg edema Neuro: no unilateral weakness, numbness, or tingling, no vision change or hearing loss Skin: no rash, no skin tear. MSK: No muscle spasm, no deformity, no limitation of range of movement in spin Heme: No easy bruising.  Travel history: No recent long  distant travel.  Allergy:  Allergies  Allergen Reactions  . Acetaminophen Hives and Nausea Only  . Atorvastatin Nausea Only    weakness    Past Medical History:  Diagnosis Date  . Arthritis    back, knees, shoulder, hands , injections in pelvis, for bone spurs  . Chronic back pain   . Diabetes mellitus    diet controlled, no meds now, was on insulin & po treatment at one time    . Diverticulosis   . Headache(784.0)    occas. relative to sinuses   . HLD (hyperlipidemia)   . Hypertension   . Peripheral neuropathy ([place])   . Radiculopathy   . Sinus complaint   . Vertigo     Past Surgical History:  Procedure Laterality Date  . ABDOMINAL HYSTERECTOMY    . ANTERIOR FUSION CERVICAL SPINE  2000  . bone spurs     removed  . LUMBAR LAMINECTOMY Left 06/30/2013   Procedure: Left L3-4 Extraforaminal approach to excise far lateral herniated nucleus pulposus;  Surgeon: Jessy Oto, MD;  Location: [facility];  Service: Orthopedics;  Laterality: Left;    Social History:  reports that she has quit smoking. She quit smokeless tobacco use about 33 years ago. She reports that she drinks alcohol. She reports that she does not use drugs.  Family History:  Family History  Problem Relation Age of Onset  . Uterine cancer Mother   . Diabetes type II Sister   . Diabetes Mellitus II Brother      Prior to Admission medications   Medication Sig Start Date End Date Taking? Authorizing Provider  amLODipine (NORVASC) 5 MG tablet Take 5 mg by mouth daily. 11/26/15  Yes  Historical Provider, MD  Aspirin-Acetaminophen-Caffeine (GOODY HEADACHE PO) Take 1 packet by mouth daily as needed (pain).   Yes Historical Provider, MD  colesevelam (WELCHOL) 625 MG tablet Take 625 mg by mouth daily.    Yes Historical Provider, MD  fluocinonide cream (LIDEX) 0.05 % APP AA BID as needed for rash 02/19/15  Yes Historical Provider, MD  hydrocodone-ibuprofen (VICOPROFEN) 5-200 MG tablet Take 1 tablet by mouth every 6 (six)  hours as needed for pain.  12/30/15  Yes Historical Provider, MD  losartan-hydrochlorothiazide (HYZAAR) 100-25 MG per tablet Take 1 tablet by mouth daily.  04/04/13  Yes Historical Provider, MD  Simethicone (GAS-X PO) Take 1 tablet by mouth daily as needed (gas).   Yes Historical Provider, MD    Physical Exam: Vitals:   02/01/16 0809 02/01/16 1047  BP: 171/72 147/77  Pulse: 64 97  Resp: 18 20  Temp: 98.8 F (37.1 C) 98.4 F (36.9 C)  TempSrc: Oral   SpO2: 100% 100%   General: Not in acute distress. Dry mucus and membranes HEENT:       Eyes: PERRL, EOMI, no scleral icterus.       ENT: No discharge from the ears and nose, no pharynx injection, no tonsillar enlargement.        Neck: No JVD, no bruit, no mass felt. Heme: No neck lymph node enlargement. Cardiac: S1/S2, RRR, No murmurs, No gallops or rubs. Respiratory: No rales, wheezing, rhonchi or rubs. GI: Soft, nondistended, has tenderness over LLQ, no rebound pain, no organomegaly, BS present. GU: No hematuria Ext: No pitting leg edema bilaterally. 2+DP/PT pulse bilaterally. Musculoskeletal: No joint deformities, No joint redness or warmth, no limitation of ROM in spin. Skin: No rashes.  Neuro: Alert, oriented X3, cranial nerves II-XII grossly intact, moves all extremities normally.  Psych: Patient is not psychotic, no suicidal or hemocidal ideation.  Labs on Admission: I have personally reviewed following labs and imaging studies  CBC:  Recent Labs Lab 02/01/16 1003  WBC 7.6  NEUTROABS 4.6  HGB 9.7*  HCT 30.5*  MCV 85.9  PLT 0000000   Basic Metabolic Panel:  Recent Labs Lab 02/01/16 0825  NA 139  K 2.9*  CL 105  CO2 26  GLUCOSE 109*  BUN 12  CREATININE 0.72  CALCIUM 8.9   GFR: CrCl cannot be calculated (Unknown ideal weight.). Liver Function Tests:  Recent Labs Lab 02/01/16 0825  AST 13*  ALT 12*  ALKPHOS 58  BILITOT 0.8  PROT 7.4  ALBUMIN 3.4*    Recent Labs Lab 02/01/16 0825  LIPASE 30    No results for input(s): AMMONIA in the last 168 hours. Coagulation Profile: No results for input(s): INR, PROTIME in the last 168 hours. Cardiac Enzymes: No results for input(s): CKTOTAL, CKMB, CKMBINDEX, TROPONINI in the last 168 hours. BNP (last 3 results) No results for input(s): PROBNP in the last 8760 hours. HbA1C: No results for input(s): HGBA1C in the last 72 hours. CBG: No results for input(s): GLUCAP in the last 168 hours. Lipid Profile: No results for input(s): CHOL, HDL, LDLCALC, TRIG, CHOLHDL, LDLDIRECT in the last 72 hours. Thyroid Function Tests: No results for input(s): TSH, T4TOTAL, FREET4, T3FREE, THYROIDAB in the last 72 hours. Anemia Panel: No results for input(s): VITAMINB12, FOLATE, FERRITIN, TIBC, IRON, RETICCTPCT in the last 72 hours. Urine analysis:    Component Value Date/Time   COLORURINE YELLOW 01/17/2015 0030   APPEARANCEUR CLEAR 01/17/2015 0030   LABSPEC 1.016 01/17/2015 0030   PHURINE 7.5 01/17/2015  0030   GLUCOSEU NEGATIVE 01/17/2015 0030   HGBUR NEGATIVE 01/17/2015 0030   BILIRUBINUR NEGATIVE 01/17/2015 0030   KETONESUR NEGATIVE 01/17/2015 0030   PROTEINUR NEGATIVE 01/17/2015 0030   UROBILINOGEN 1.0 01/17/2015 0030   NITRITE NEGATIVE 01/17/2015 0030   LEUKOCYTESUR NEGATIVE 01/17/2015 0030   Sepsis Labs: @LABRCNTIP (procalcitonin:4,lacticidven:4) )No results found for this or any previous visit (from the past 240 hour(s)).   Radiological Exams on Admission: Ct Abdomen Pelvis W Contrast  Result Date: 02/01/2016 CLINICAL DATA:  Patient feels like her in sides are coming out. No nausea or vomiting. EXAM: CT ABDOMEN AND PELVIS WITH CONTRAST TECHNIQUE: Multidetector CT imaging of the abdomen and pelvis was performed using the standard protocol following bolus administration of intravenous contrast. CONTRAST:  27mL ISOVUE-300 IOPAMIDOL (ISOVUE-300) INJECTION 61%, 1108mL ISOVUE-300 IOPAMIDOL (ISOVUE-300) INJECTION 61% COMPARISON:  Lumbar spine MRI  02/03/2015 ; CT abdomen pelvis 06/23/2013. FINDINGS: Lower chest: The heart is prominent in size. Minimal subpleural scarring and or atelectasis within the lower lobes bilaterally. No pleural effusion. Hepatobiliary: Liver is normal in size and contour. Grossly unchanged cysts within the liver measuring up to 3.6 cm near the hepatic dome (image 15; series 2). Pancreas: Unremarkable Spleen: Unremarkable Adrenals/Urinary Tract: Normal adrenal glands. Kidneys enhance symmetrically with contrast. No hydronephrosis. Urinary bladder is unremarkable. Stomach/Bowel: Circumferential wall thickening and surrounding fat stranding about the sigmoid colon (image 66; series 2), most compatible with acute sigmoid diverticulitis. Suggestion of small intramural fluid collection within the sigmoid colon measuring 12 mm (image 45; series 5). No evidence for perforation. Normal morphology of the stomach. No free intraperitoneal air. No evidence for perforation. Vascular/Lymphatic: Peripheral calcified atherosclerotic plaque involving the abdominal aorta. Infrarenal abdominal aortic ectasia measuring 2.4 cm. No retroperitoneal lymphadenopathy. Reproductive: Patient status post hysterectomy. Other: None. Musculoskeletal: Lower thoracic and lumbar spine degenerative changes. No aggressive or acute appearing osseous lesions. Bilateral hip joint degenerative changes. IMPRESSION: Circumferential wall thickening and surrounding fat stranding about the sigmoid colon suggestive of acute sigmoid diverticulitis. Given the overall imaging appearance, if not previously performed, recommend correlation with colonoscopy after resolution of the acute symptomatology to exclude underlying mass. Aortic atherosclerosis. Electronically Signed   By: Lovey Newcomer M.D.   On: 02/01/2016 09:35     EKG:  Not done in ED, will get one.   Assessment/Plan Principal Problem:   Acute diverticulitis Active Problems:   Herniated lumbar intervertebral disc    Abdominal pain   Hypertension   HLD (hyperlipidemia)   Diabetes mellitus without complication (HCC)   Hypokalemia   Acute diverticulitis and dehydration: Patient's abdominal pain is most likely caused by sigmoid diverticulitis as evidenced by CT abdomen/pelvis. Patient is not septic on admission. Hemodynamically stable. Patient is clinically dehydrated.  -will place on tele bed for obs - npo for bowel rest - prn morphine for pain  -prn Zofran for nausea  - Blood culture x 2 if develops fevere - IV antibiotics: Flagyl plus Cipro - will check lactic acid levels - IVF: 1L of NS bolus in ED, followed by 100 cc/h - prn colace for constipation - may consult to GI if getting worse  Hx of Herniated lumbar intervertebral disc -continue Vicoprofen prn  HTN: -continue amlodipine -Swish Hyzaar to losartan 100 mg daily since patient needed IV fluid -IV hydralazine when necessary  HLD (hyperlipidemia): -continue Welchol  Diet-controled DM: no A1c on record. Blood surg 109. -monitoring blood sugar-->check CBG every morning.   Hypokalemia: K= 2.9   on admission. - Repleted with 40 mEq  of KCl x 2 - Check Mg level    DVT ppx: SQ Lovenox Code Status: Full code Family Communication: None at bed side.  Disposition Plan:  Anticipate discharge back to previous home environment Consults called:  none Admission status: medical floor/obs    Date of Service 02/01/2016    Ivor Costa [facility] Pager [phone]  If 7PM-7AM, please contact night-coverage [email or web address] Password [email or web address] 02/01/2016, 11:04 AM

## 2016-02-02 DIAGNOSIS — K5900 Constipation, unspecified: Secondary | ICD-10-CM | POA: Diagnosis present

## 2016-02-02 DIAGNOSIS — E785 Hyperlipidemia, unspecified: Secondary | ICD-10-CM | POA: Diagnosis present

## 2016-02-02 DIAGNOSIS — E876 Hypokalemia: Secondary | ICD-10-CM | POA: Diagnosis present

## 2016-02-02 DIAGNOSIS — K5732 Diverticulitis of large intestine without perforation or abscess without bleeding: Secondary | ICD-10-CM | POA: Diagnosis present

## 2016-02-02 DIAGNOSIS — M5126 Other intervertebral disc displacement, lumbar region: Secondary | ICD-10-CM

## 2016-02-02 DIAGNOSIS — K5792 Diverticulitis of intestine, part unspecified, without perforation or abscess without bleeding: Secondary | ICD-10-CM

## 2016-02-02 DIAGNOSIS — Z886 Allergy status to analgesic agent status: Secondary | ICD-10-CM | POA: Diagnosis not present

## 2016-02-02 DIAGNOSIS — I10 Essential (primary) hypertension: Secondary | ICD-10-CM

## 2016-02-02 DIAGNOSIS — G8929 Other chronic pain: Secondary | ICD-10-CM | POA: Diagnosis present

## 2016-02-02 DIAGNOSIS — Z981 Arthrodesis status: Secondary | ICD-10-CM | POA: Diagnosis not present

## 2016-02-02 DIAGNOSIS — Z808 Family history of malignant neoplasm of other organs or systems: Secondary | ICD-10-CM | POA: Diagnosis not present

## 2016-02-02 DIAGNOSIS — Z87891 Personal history of nicotine dependence: Secondary | ICD-10-CM | POA: Diagnosis not present

## 2016-02-02 DIAGNOSIS — Z833 Family history of diabetes mellitus: Secondary | ICD-10-CM | POA: Diagnosis not present

## 2016-02-02 DIAGNOSIS — R1032 Left lower quadrant pain: Secondary | ICD-10-CM | POA: Diagnosis present

## 2016-02-02 DIAGNOSIS — E860 Dehydration: Secondary | ICD-10-CM | POA: Diagnosis present

## 2016-02-02 DIAGNOSIS — Z79891 Long term (current) use of opiate analgesic: Secondary | ICD-10-CM | POA: Diagnosis not present

## 2016-02-02 DIAGNOSIS — E119 Type 2 diabetes mellitus without complications: Secondary | ICD-10-CM

## 2016-02-02 DIAGNOSIS — M549 Dorsalgia, unspecified: Secondary | ICD-10-CM | POA: Diagnosis present

## 2016-02-02 DIAGNOSIS — E1142 Type 2 diabetes mellitus with diabetic polyneuropathy: Secondary | ICD-10-CM | POA: Diagnosis present

## 2016-02-02 DIAGNOSIS — Z7982 Long term (current) use of aspirin: Secondary | ICD-10-CM | POA: Diagnosis not present

## 2016-02-02 LAB — BASIC METABOLIC PANEL
ANION GAP: 6 (ref 5–15)
BUN: 7 mg/dL (ref 6–20)
CHLORIDE: 108 mmol/L (ref 101–111)
CO2: 27 mmol/L (ref 22–32)
Calcium: 8.7 mg/dL — ABNORMAL LOW (ref 8.9–10.3)
Creatinine, Ser: 0.72 mg/dL (ref 0.44–1.00)
GFR calc Af Amer: >60 mL/min (ref >60)
GLUCOSE: 87 mg/dL (ref 65–99)
POTASSIUM: 2.9 mmol/L — AB (ref 3.5–5.1)
SODIUM: 141 mmol/L (ref 135–145)

## 2016-02-02 LAB — GLUCOSE, CAPILLARY
GLUCOSE-CAPILLARY: 103 mg/dL — AB (ref 65–99)
GLUCOSE-CAPILLARY: 162 mg/dL — AB (ref 65–99)
GLUCOSE-CAPILLARY: 112 mg/dL — AB (ref 65–99)
Glucose-Capillary: 108 mg/dL — ABNORMAL HIGH (ref 65–99)

## 2016-02-02 LAB — CBC
HEMATOCRIT: 28.1 % — AB (ref 36.0–46.0)
Hemoglobin: 9.1 g/dL — ABNORMAL LOW (ref 12.0–15.0)
MCH: 27.7 pg (ref 26.0–34.0)
MCHC: 32.4 g/dL (ref 30.0–36.0)
MCV: 85.4 fL (ref 78.0–100.0)
Platelets: 340 10*3/uL (ref 150–400)
RBC: 3.29 MIL/uL — ABNORMAL LOW (ref 3.87–5.11)
RDW: 14.9 % (ref 11.5–15.5)
WBC: 5.3 10*3/uL (ref 4.0–10.5)

## 2016-02-02 MED ORDER — POTASSIUM CHLORIDE IN NACL 40-0.9 MEQ/L-% IV SOLN
INTRAVENOUS | Status: DC
  Administered 2016-02-02: 100 mL/h via INTRAVENOUS
  Filled 2016-02-02 (×4): qty 1000

## 2016-02-02 NOTE — Care Management Obs Status (Signed)
[facility] NOTIFICATION   Patient Details  Name: Angela Pugh MRN: [id number] Date of Birth: Oct 09, 1945   Medicare Observation Status Notification Given:  Yes    Erenest Rasher, RN 02/02/2016, 2:59 PM

## 2016-02-02 NOTE — Care Management Note (Signed)
Case Management Note  Patient Details  Name: Angela Pugh MRN: [id number] Date of Birth: 07-02-1945  Subjective/Objective:      Acute sigmoid diverticulitis and dehydration               Action/Plan: Discharge Planning: NCM spoke to pt and lives at home alone. States her children assist with care as needed. Pt states she still works and drives.   PCP - Wallene Huh MD  Expected Discharge Date:  02/03/2016              Expected Discharge Plan:  Home/Self Care  In-House Referral:  NA  Discharge planning Services  CM Consult  Post Acute Care Choice:  NA Choice offered to:  NA  DME Arranged:  N/A DME Agency:  NA  HH Arranged:  NA HH Agency:  NA  Status of Service:  Completed, signed off  If discussed at [place] of Stay Meetings, dates discussed:    Additional Comments:  Erenest Rasher, RN 02/02/2016, 2:38 PM

## 2016-02-02 NOTE — Progress Notes (Signed)
PROGRESS NOTE   Angela Pugh  [id number]  DOB: 01/17/1946  DOA: 02/01/2016 PCP: Patricia Nettle, MD Outpatient Specialists:  Hospital course: Angela Pugh is a 70 y.o. female with medical history significant of diverticulosis, hypertension, hyperlipidemia, diet-controlled diabetes, chronic back pain, vertigo, peripheral neuropathy, who presents with abdominal pain.  Patient states that she has been having abdominal pain for 5 days. Her abdominal pain is located in the left lower quadrant, sharp, constant, 10 out of 10 severity, nonradiating. It is not aggravated or alleviated by any known factors. Patient denies fever, chills, nausea, vomiting, diarrhea. She has generalized weakness, and poor oral intake. She is mildly constipated. Patient denies symptoms of UTI.  No unilateral weakness.    Assessment & Plan: Acute sigmoid diverticulitis and dehydration: Patient's abdominal pain is most likely caused by sigmoid diverticulitis as evidenced by CT abdomen/pelvis. Patient is not septic on admission. Hemodynamically stable. Patient is clinically dehydrated.  - advance diet: start clears 9/17, advance as tolerated to goal heart health/carb modified diet - prn morphine for pain  -prn Zofran for nausea  - Blood culture not done on admission - IV antibiotics: Flagyl plus Cipro - normal lactate levels - continue IVF for now while NPO, replace K - Pt should follow up with GI to have colonoscopy in near future when this has completely resolved  Hx of Herniated lumbar intervertebral disc -pain meds ordered as needed  HTN: -continue amlodipine -switched Hyzaar to losartan 100 mg daily, Hold the HCTZ for now -IV hydralazine when necessary  HLD (hyperlipidemia): -continue Welchol at discharge, can hold while in hospital  Diet-controlled DM: no A1c on record. Blood surg 109. -monitoring blood sugar-->check CBG every morning.  - A1c pending  Hypokalemia: K= 2.9   on  admission. - Repleted with 40 mEq of KCl x 2 - Repeat Mg in AM - Added K to IVF 9/17  DVT ppx: SQ Lovenox Code Status: Full code Family Communication: None at bedside.  Disposition Plan:  Anticipate discharge tomorrow if continues to improve Consults called:  none Admission status: medical floor/obs    Subjective: Pt says she is feeling better and would like to start diet.  Abd pain improving.    Objective: Vitals:   02/01/16 1243 02/01/16 2206 02/02/16 0532 02/02/16 1031  BP: (!) 147/71 (!) 128/49 136/60 118/62  Pulse: (!) 58 (!) 56 (!) 51   Resp: 18 18 18    Temp: 98.3 F (36.8 C) 98.6 F (37 C) 98.7 F (37.1 C)   TempSrc: Oral Oral Oral   SpO2: 100% 99% 99%   Weight:      Height:        Intake/Output Summary (Last 24 hours) at 02/02/16 1234 Last data filed at 02/02/16 1217  Gross per 24 hour  Intake          2208.33 ml  Output             3250 ml  Net         -1041.67 ml   Filed Weights   02/01/16 1213  Weight: 79.8 kg (176 lb)   Exam:  General exam: awake, alert, no distress, cooperative, pleasant Respiratory system: BBS Clear. No increased work of breathing. Cardiovascular system: S1 & S2 heard, RRR. No JVD, murmurs, gallops, clicks or pedal edema. Gastrointestinal system: Abdomen is nondistended, soft and mild LLQ tenderness, no guarding. Normal bowel sounds heard. Central nervous system: Alert and oriented. No focal neurological deficits. Extremities: no CCE.  Data Reviewed: Basic  Metabolic Panel:  Recent Labs Lab 02/01/16 0825 02/02/16 0410  NA 139 141  K 2.9* 2.9*  CL 105 108  CO2 26 27  GLUCOSE 109* 87  BUN 12 7  CREATININE 0.72 0.72  CALCIUM 8.9 8.7*  MG 1.8  --    Liver Function Tests:  Recent Labs Lab 02/01/16 0825  AST 13*  ALT 12*  ALKPHOS 58  BILITOT 0.8  PROT 7.4  ALBUMIN 3.4*    Recent Labs Lab 02/01/16 0825  LIPASE 30   No results for input(s): AMMONIA in the last 168 hours. CBC:  Recent Labs Lab  02/01/16 1003 02/02/16 0410  WBC 7.6 5.3  NEUTROABS 4.6  --   HGB 9.7* 9.1*  HCT 30.5* 28.1*  MCV 85.9 85.4  PLT 370 340   Cardiac Enzymes: No results for input(s): CKTOTAL, CKMB, CKMBINDEX, TROPONINI in the last 168 hours. CBG (last 3)   Recent Labs  02/01/16 2229 02/02/16 0802  GLUCAP 108* 108*   No results found for this or any previous visit (from the past 240 hour(s)).   Studies: Ct Abdomen Pelvis W Contrast  Result Date: 02/01/2016 CLINICAL DATA:  Patient feels like her in sides are coming out. No nausea or vomiting. EXAM: CT ABDOMEN AND PELVIS WITH CONTRAST TECHNIQUE: Multidetector CT imaging of the abdomen and pelvis was performed using the standard protocol following bolus administration of intravenous contrast. CONTRAST:  35mL ISOVUE-300 IOPAMIDOL (ISOVUE-300) INJECTION 61%, 169mL ISOVUE-300 IOPAMIDOL (ISOVUE-300) INJECTION 61% COMPARISON:  Lumbar spine MRI 02/03/2015 ; CT abdomen pelvis 06/23/2013. FINDINGS: Lower chest: The heart is prominent in size. Minimal subpleural scarring and or atelectasis within the lower lobes bilaterally. No pleural effusion. Hepatobiliary: Liver is normal in size and contour. Grossly unchanged cysts within the liver measuring up to 3.6 cm near the hepatic dome (image 15; series 2). Pancreas: Unremarkable Spleen: Unremarkable Adrenals/Urinary Tract: Normal adrenal glands. Kidneys enhance symmetrically with contrast. No hydronephrosis. Urinary bladder is unremarkable. Stomach/Bowel: Circumferential wall thickening and surrounding fat stranding about the sigmoid colon (image 66; series 2), most compatible with acute sigmoid diverticulitis. Suggestion of small intramural fluid collection within the sigmoid colon measuring 12 mm (image 45; series 5). No evidence for perforation. Normal morphology of the stomach. No free intraperitoneal air. No evidence for perforation. Vascular/Lymphatic: Peripheral calcified atherosclerotic plaque involving the abdominal  aorta. Infrarenal abdominal aortic ectasia measuring 2.4 cm. No retroperitoneal lymphadenopathy. Reproductive: Patient status post hysterectomy. Other: None. Musculoskeletal: Lower thoracic and lumbar spine degenerative changes. No aggressive or acute appearing osseous lesions. Bilateral hip joint degenerative changes. IMPRESSION: Circumferential wall thickening and surrounding fat stranding about the sigmoid colon suggestive of acute sigmoid diverticulitis. Given the overall imaging appearance, if not previously performed, recommend correlation with colonoscopy after resolution of the acute symptomatology to exclude underlying mass. Aortic atherosclerosis. Electronically Signed   By: Lovey Newcomer M.D.   On: 02/01/2016 09:35   Scheduled Meds: . amLODipine  5 mg Oral Daily  . ciprofloxacin  400 mg Intravenous Q12H  . enoxaparin (LOVENOX) injection  40 mg Subcutaneous Q24H  . losartan  100 mg Oral Daily  . metronidazole  500 mg Intravenous Q8H   Continuous Infusions: . 0.9 % NaCl with KCl 40 mEq / L     Principal Problem:   Acute diverticulitis Active Problems:   Herniated lumbar intervertebral disc   Abdominal pain   Hypertension   HLD (hyperlipidemia)   Diabetes mellitus without complication ([facility])   Hypokalemia  Time spent:   [employer],  MD [facility] Pager [phone]  If 7PM-7AM, please contact night-coverage [email or web address] Password [email or web address] 02/02/2016, 12:34 PM    LOS: 0 days

## 2016-02-03 LAB — CBC WITH DIFFERENTIAL/PLATELET
BASOS ABS: 0 10*3/uL (ref 0.0–0.1)
BASOS PCT: 1 %
EOS ABS: 0.1 10*3/uL (ref 0.0–0.7)
EOS PCT: 3 %
HCT: 29.8 % — ABNORMAL LOW (ref 36.0–46.0)
Hemoglobin: 9.7 g/dL — ABNORMAL LOW (ref 12.0–15.0)
LYMPHS PCT: 41 %
Lymphs Abs: 2.3 10*3/uL (ref 0.7–4.0)
MCH: 27.6 pg (ref 26.0–34.0)
MCHC: 32.6 g/dL (ref 30.0–36.0)
MCV: 84.9 fL (ref 78.0–100.0)
MONO ABS: 0.6 10*3/uL (ref 0.1–1.0)
Monocytes Relative: 11 %
Neutro Abs: 2.6 10*3/uL (ref 1.7–7.7)
Neutrophils Relative %: 46 %
PLATELETS: 330 10*3/uL (ref 150–400)
RBC: 3.51 MIL/uL — AB (ref 3.87–5.11)
RDW: 14.9 % (ref 11.5–15.5)
WBC: 5.6 10*3/uL (ref 4.0–10.5)

## 2016-02-03 LAB — COMPREHENSIVE METABOLIC PANEL
ALBUMIN: 3.3 g/dL — AB (ref 3.5–5.0)
ALT: 11 U/L — AB (ref 14–54)
AST: 16 U/L (ref 15–41)
Alkaline Phosphatase: 55 U/L (ref 38–126)
Anion gap: 5 (ref 5–15)
BUN: 8 mg/dL (ref 6–20)
CHLORIDE: 109 mmol/L (ref 101–111)
CO2: 26 mmol/L (ref 22–32)
CREATININE: 0.79 mg/dL (ref 0.44–1.00)
Calcium: 8.8 mg/dL — ABNORMAL LOW (ref 8.9–10.3)
GFR calc non Af Amer: >60 mL/min (ref >60)
Glucose, Bld: 94 mg/dL (ref 65–99)
POTASSIUM: 3.3 mmol/L — AB (ref 3.5–5.1)
SODIUM: 140 mmol/L (ref 135–145)
Total Bilirubin: 0.4 mg/dL (ref 0.3–1.2)
Total Protein: 7.3 g/dL (ref 6.5–8.1)

## 2016-02-03 LAB — HEMOGLOBIN A1C
HEMOGLOBIN A1C: 5.9 % — AB (ref 4.8–5.6)
MEAN PLASMA GLUCOSE: 123 mg/dL

## 2016-02-03 LAB — GLUCOSE, CAPILLARY: GLUCOSE-CAPILLARY: 105 mg/dL — AB (ref 65–99)

## 2016-02-03 LAB — MAGNESIUM: Magnesium: 1.7 mg/dL (ref 1.7–2.4)

## 2016-02-03 MED ORDER — CIPROFLOXACIN HCL 500 MG PO TABS: 500.0000 mg | ORAL_TABLET | Freq: Two times a day (BID) | ORAL | 0 refills | Status: AC

## 2016-02-03 MED ORDER — METRONIDAZOLE 500 MG PO TABS: 500.0000 mg | ORAL_TABLET | Freq: Two times a day (BID) | ORAL | 0 refills | Status: AC

## 2016-02-03 MED ORDER — POTASSIUM CHLORIDE 20 MEQ/15ML (10%) PO SOLN: 20.0000 meq | Freq: Every day | ORAL | 0 refills | Status: DC

## 2016-02-03 MED ORDER — POTASSIUM CHLORIDE CRYS ER 20 MEQ PO TBCR
40.0000 meq | EXTENDED_RELEASE_TABLET | Freq: Once | ORAL | Status: AC
  Administered 2016-02-03: 40 meq via ORAL
  Filled 2016-02-03: qty 2

## 2016-02-03 MED ORDER — LOSARTAN POTASSIUM 100 MG PO TABS: 100.0000 mg | ORAL_TABLET | Freq: Every day | ORAL | 0 refills | Status: DC

## 2016-02-03 MED ORDER — DOCUSATE SODIUM 100 MG PO CAPS: 100.0000 mg | ORAL_CAPSULE | Freq: Two times a day (BID) | ORAL | 0 refills | Status: DC | PRN

## 2016-02-03 NOTE — Discharge Summary (Signed)
Physician Discharge Summary  Angela Pugh [id number] DOB: [birth year] DOA: 02/01/2016  PCP: Patricia Nettle, MD  Admit date: 02/01/2016 Discharge date: 02/03/2016  Admitted From: Home  Disposition:  Home   Recommendations for Outpatient Follow-up:  1. Follow up with PCP in 1-2 weeks 2. Please obtain BMP/CBC on follow up  3. Please follow up with GI for colonoscopy  Discharge Condition: STABLE CODE STATUS: FULL  Brief/Interim Summary: Angela L Loganis a 70 y.o.femalewith medical history significant of diverticulosis, hypertension, hyperlipidemia, diet-controlled diabetes, chronic back pain, vertigo, peripheral neuropathy, who presents with abdominal pain.  Patient states that she has been having abdominal pain for 5 days. Her abdominal pain is located in the left lower quadrant, sharp, constant, 10 out of 10 severity, nonradiating. It is not aggravated or alleviated by any known factors. Patient denies fever, chills, nausea, vomiting, diarrhea. She has generalized weakness, and poor oral intake. She is mildly constipated. Patient denies symptoms of UTI.  No unilateral weakness.    Assessment & Plan: Acute sigmoid diverticulitis and dehydration: Patient's abdominal pain is most likely caused by sigmoid diverticulitis as evidenced by CT abdomen/pelvis. Patient is not septic on admission. Hemodynamically stable. Patient is clinically dehydrated.  - IV antibiotics: Flagyl plus Cipro; will send home on 5 more days of oral cipro and flagy   - normal lactate levels - Pt should follow up with GI to have colonoscopy in near future when this has completely resolved - Pt tolerated regular diet well and pain resolved, will discharge home to follow up with PCP and GI - Pt advised to avoid nuts  Hx ofHerniated lumbar intervertebral disc -pain meds ordered as needed  HTN: -continue amlodipine -switched Hyzaar to losartan 100 mg daily, Hold the HCTZ for now - follow up with PCP to  discuss blood pressure meds and management.   HLD (hyperlipidemia): -continue Welchol at discharge, can hold while in hospital, resume at discharge  Diet-controlled DM: no A1c on record. Blood surg 109. -monitoring blood sugar-->check CBG every morning.   Hypokalemia: K= 2.9on admission. - Repleted with 40 mEq of KCl x 2 - Repeat Mg in AM - Added K to IVF 9/17 - Pt sent home with 5 more days of potassium supplement  DVT ppx: SQ Lovenox Code Status:Full code Family Communication: at bedside.  Disposition Plan: Home Consults called:none Admission status:medical floor/obs   Discharge Diagnoses:  Principal Problem:   Acute diverticulitis Active Problems:   Herniated lumbar intervertebral disc   Abdominal pain   Hypertension   HLD (hyperlipidemia)   Diabetes mellitus without complication ([facility])   Hypokalemia  Discharge Instructions    Medication List    STOP taking these medications   losartan-hydrochlorothiazide 100-25 MG tablet Commonly known as:  HYZAAR     TAKE these medications   amLODipine 5 MG tablet Commonly known as:  NORVASC Take 5 mg by mouth daily.   ciprofloxacin 500 MG tablet Commonly known as:  CIPRO Take 1 tablet (500 mg total) by mouth 2 (two) times daily.   colesevelam 625 MG tablet Commonly known as:  WELCHOL Take 625 mg by mouth daily.   docusate sodium 100 MG capsule Commonly known as:  COLACE Take 1 capsule (100 mg total) by mouth 2 (two) times daily as needed for mild constipation.   fluocinonide cream 0.05 % Commonly known as:  LIDEX APP AA BID as needed for rash   GAS-X PO Take 1 tablet by mouth daily as needed (gas).   GOODY HEADACHE PO Take  1 packet by mouth daily as needed (pain).   hydrocodone-ibuprofen 5-200 MG tablet Commonly known as:  VICOPROFEN Take 1 tablet by mouth every 6 (six) hours as needed for pain.   losartan 100 MG tablet Commonly known as:  COZAAR Take 1 tablet (100 mg total) by mouth  daily.   metroNIDAZOLE 500 MG tablet Commonly known as:  FLAGYL Take 1 tablet (500 mg total) by mouth 2 (two) times daily with a meal. DO NOT CONSUME ALCOHOL WHILE TAKING THIS MEDICATION.   potassium chloride 20 MEQ/15ML (10%) Soln Take 15 mLs (20 mEq total) by mouth daily.      Follow-up [place], MD. Schedule an appointment as soon as possible for a visit in 1 week(s).   Specialty:  Cardiology Why:  Hospital Follow Up  Contact information: [place] [address] [phone]        MANN,JYOTHI, MD. Schedule an appointment as soon as possible for a visit in 3 week(s).   Specialty:  Gastroenterology Why:  Establish Care, set up colonoscopy Contact information: [address] [place] [address] [phone]          Allergies  Allergen Reactions  . Acetaminophen Hives and Nausea Only  . Atorvastatin Nausea Only    weakness   Procedures/Studies: Ct Abdomen Pelvis W Contrast  Result Date: 02/01/2016 CLINICAL DATA:  Patient feels like her in sides are coming out. No nausea or vomiting. EXAM: CT ABDOMEN AND PELVIS WITH CONTRAST TECHNIQUE: Multidetector CT imaging of the abdomen and pelvis was performed using the standard protocol following bolus administration of intravenous contrast. CONTRAST:  34mL ISOVUE-300 IOPAMIDOL (ISOVUE-300) INJECTION 61%, 167mL ISOVUE-300 IOPAMIDOL (ISOVUE-300) INJECTION 61% COMPARISON:  Lumbar spine MRI 02/03/2015 ; CT abdomen pelvis 06/23/2013. FINDINGS: Lower chest: The heart is prominent in size. Minimal subpleural scarring and or atelectasis within the lower lobes bilaterally. No pleural effusion. Hepatobiliary: Liver is normal in size and contour. Grossly unchanged cysts within the liver measuring up to 3.6 cm near the hepatic dome (image 15; series 2). Pancreas: Unremarkable Spleen: Unremarkable Adrenals/Urinary Tract: Normal adrenal glands. Kidneys enhance symmetrically with  contrast. No hydronephrosis. Urinary bladder is unremarkable. Stomach/Bowel: Circumferential wall thickening and surrounding fat stranding about the sigmoid colon (image 66; series 2), most compatible with acute sigmoid diverticulitis. Suggestion of small intramural fluid collection within the sigmoid colon measuring 12 mm (image 45; series 5). No evidence for perforation. Normal morphology of the stomach. No free intraperitoneal air. No evidence for perforation. Vascular/Lymphatic: Peripheral calcified atherosclerotic plaque involving the abdominal aorta. Infrarenal abdominal aortic ectasia measuring 2.4 cm. No retroperitoneal lymphadenopathy. Reproductive: Patient status post hysterectomy. Other: None. Musculoskeletal: Lower thoracic and lumbar spine degenerative changes. No aggressive or acute appearing osseous lesions. Bilateral hip joint degenerative changes. IMPRESSION: Circumferential wall thickening and surrounding fat stranding about the sigmoid colon suggestive of acute sigmoid diverticulitis. Given the overall imaging appearance, if not previously performed, recommend correlation with colonoscopy after resolution of the acute symptomatology to exclude underlying mass. Aortic atherosclerosis. Electronically Signed   By: Lovey Newcomer M.D.   On: 02/01/2016 09:35   Subjective: Pt says she feels much better.  She tolerated diet very well.    Discharge Exam: Vitals:   02/02/16 2246 02/03/16 0646  BP: 131/60 (!) 151/64  Pulse: (!) 58 62  Resp:    Temp: 99.2 F (37.3 C) 98.9 F (37.2 C)   Vitals:   02/02/16 1031 02/02/16 1441 02/02/16 2246 02/03/16 [id number]  BP: 118/62 126/65 131/60 (!) 151/64  Pulse:  (!) 58 (!) 58 62  Resp:  18    Temp:  99 F (37.2 C) 99.2 F (37.3 C) 98.9 F (37.2 C)  TempSrc:  Oral Oral Oral  SpO2:  98% 98% 98%  Weight:      Height:        General: Pt is alert, awake, not in acute distress Cardiovascular: RRR, S1/S2 +, no rubs, no gallops Respiratory: CTA  bilaterally, no wheezing, no rhonchi Abdominal: Soft, NT, ND, bowel sounds + Extremities: no edema, no cyanosis    The results of significant diagnostics from this hospitalization (including imaging, microbiology, ancillary and laboratory) are listed below for reference.     Microbiology: No results found for this or any previous visit (from the past 240 hour(s)).   Labs: BNP (last 3 results) No results for input(s): BNP in the last 8760 hours. Basic Metabolic Panel:  Recent Labs Lab 02/01/16 0825 02/02/16 0410 02/03/16 0416  NA 139 141 140  K 2.9* 2.9* 3.3*  CL 105 108 109  CO2 26 27 26   GLUCOSE 109* 87 94  BUN 12 7 8   CREATININE 0.72 0.72 0.79  CALCIUM 8.9 8.7* 8.8*  MG 1.8  --  1.7   Liver Function Tests:  Recent Labs Lab 02/01/16 0825 02/03/16 0416  AST 13* 16  ALT 12* 11*  ALKPHOS 58 55  BILITOT 0.8 0.4  PROT 7.4 7.3  ALBUMIN 3.4* 3.3*    Recent Labs Lab 02/01/16 0825  LIPASE 30   No results for input(s): AMMONIA in the last 168 hours. CBC:  Recent Labs Lab 02/01/16 1003 02/02/16 0410 02/03/16 0416  WBC 7.6 5.3 5.6  NEUTROABS 4.6  --  2.6  HGB 9.7* 9.1* 9.7*  HCT 30.5* 28.1* 29.8*  MCV 85.9 85.4 84.9  PLT 370 340 330   Cardiac Enzymes: No results for input(s): CKTOTAL, CKMB, CKMBINDEX, TROPONINI in the last 168 hours. BNP: Invalid input(s): POCBNP CBG:  Recent Labs Lab 02/02/16 0802 02/02/16 1237 02/02/16 1729 02/02/16 2232 02/03/16 0806  GLUCAP 108* 103* 162* 112* 105*   D-Dimer No results for input(s): DDIMER in the last 72 hours. Hgb A1c  Recent Labs  02/02/16 0401  HGBA1C 5.9*   Lipid Profile No results for input(s): CHOL, HDL, LDLCALC, TRIG, CHOLHDL, LDLDIRECT in the last 72 hours. Thyroid function studies No results for input(s): TSH, T4TOTAL, T3FREE, THYROIDAB in the last 72 hours.  Invalid input(s): FREET3 Anemia work up No results for input(s): VITAMINB12, FOLATE, FERRITIN, TIBC, IRON, RETICCTPCT in the  last 72 hours. Urinalysis    Component Value Date/Time   COLORURINE YELLOW 02/01/2016 0812   APPEARANCEUR CLEAR 02/01/2016 0812   LABSPEC 1.020 02/01/2016 0812   PHURINE 7.0 02/01/2016 0812   GLUCOSEU NEGATIVE 02/01/2016 0812   HGBUR SMALL (A) 02/01/2016 0812   BILIRUBINUR NEGATIVE 02/01/2016 0812   KETONESUR NEGATIVE 02/01/2016 0812   PROTEINUR NEGATIVE 02/01/2016 0812   UROBILINOGEN 1.0 01/17/2015 0030   NITRITE NEGATIVE 02/01/2016 0812   LEUKOCYTESUR NEGATIVE 02/01/2016 0812   Sepsis Labs Invalid input(s): PROCALCITONIN,  WBC,  LACTICIDVEN Microbiology No results found for this or any previous visit (from the past 240 hour(s)).  Time coordinating discharge: 32 minutes  SIGNED:  Irwin Brakeman, MD  [facility] 02/03/2016, 11:26 AM Pager   If 7PM-7AM, please contact night-coverage [email or web address] Password [email or web address]

## 2016-02-03 NOTE — Discharge Instructions (Signed)
Diverticulitis Diverticulitis is when small pockets that have formed in your colon (large intestine) become infected or swollen. HOME CARE  Follow your doctor's instructions.  Follow a special diet if told by your doctor.  When you feel better, your doctor may tell you to change your diet. You may be told to eat a lot of fiber. Fruits and vegetables are good sources of fiber. Fiber makes it easier to poop (have bowel movements).  Take supplements or probiotics as told by your doctor.  Only take medicines as told by your doctor.  Keep all follow-up visits with your doctor. GET HELP IF:  Your pain does not get better.  You have a hard time eating food.  You are not pooping like normal. GET HELP RIGHT AWAY IF:  Your pain gets worse.  Your problems do not get better.  Your problems suddenly get worse.  You have a fever.  You keep throwing up (vomiting).  You have bloody or black, tarry poop (stool). MAKE SURE YOU:   Understand these instructions.  Will watch your condition.  Will get help right away if you are not doing well or get worse.   This information is not intended to replace advice given to you by your health care provider. Make sure you discuss any questions you have with your health care provider.   Document Released: 10/21/2007 Document Revised: 05/09/2013 Document Reviewed: 03/29/2013 Elsevier Interactive Patient Education 2016 [place] DASH stands for "Dietary Approaches to Stop Hypertension." The DASH eating plan is a healthy eating plan that has been shown to reduce high blood pressure (hypertension). Additional health benefits may include reducing the risk of type 2 diabetes mellitus, heart disease, and stroke. The DASH eating plan may also help with weight loss. WHAT DO I NEED TO KNOW ABOUT THE DASH EATING PLAN? For the DASH eating plan, you will follow these general guidelines:  Choose foods with a percent daily value for  sodium of less than 5% (as listed on the food label).  Use salt-free seasonings or herbs instead of table salt or sea salt.  Check with your health care provider or pharmacist before using salt substitutes.  Eat lower-sodium products, often labeled as "lower sodium" or "no salt added."  Eat fresh foods.  Eat more vegetables, fruits, and low-fat dairy products.  Choose whole grains. Look for the word "whole" as the first word in the ingredient list.  Choose fish and skinless chicken or [place] more often than red meat. Limit fish, poultry, and meat to 6 oz (170 g) each day.  Limit sweets, desserts, sugars, and sugary drinks.  Choose heart-healthy fats.  Limit cheese to 1 oz (28 g) per day.  Eat more home-cooked food and less restaurant, buffet, and fast food.  Limit fried foods.  Cook foods using methods other than frying.  Limit canned vegetables. If you do use them, rinse them well to decrease the sodium.  When eating at a restaurant, ask that your food be prepared with less salt, or no salt if possible. WHAT FOODS CAN I EAT? Seek help from a dietitian for individual calorie needs. Grains Whole grain or whole wheat bread. Brown rice. Whole grain or whole wheat pasta. Quinoa, bulgur, and whole grain cereals. Low-sodium cereals. Corn or whole wheat flour tortillas. Whole grain cornbread. Whole grain crackers. Low-sodium crackers. Vegetables Fresh or frozen vegetables (raw, steamed, roasted, or grilled). Low-sodium or reduced-sodium tomato and vegetable juices. Low-sodium or reduced-sodium tomato sauce and paste.  Low-sodium or reduced-sodium canned vegetables.  Fruits All fresh, canned (in natural juice), or frozen fruits. Meat and Other Protein Products Ground beef (85% or leaner), grass-fed beef, or beef trimmed of fat. Skinless chicken or [place]. Ground chicken or [place]. Pork trimmed of fat. All fish and seafood. Eggs. Dried beans, peas, or lentils. Unsalted nuts and seeds.  Unsalted canned beans. Dairy Low-fat dairy products, such as skim or 1% milk, 2% or reduced-fat cheeses, low-fat ricotta or cottage cheese, or plain low-fat yogurt. Low-sodium or reduced-sodium cheeses. Fats and Oils Tub margarines without trans fats. Light or reduced-fat mayonnaise and salad dressings (reduced sodium). Avocado. Safflower, olive, or canola oils. Natural peanut or almond butter. Other Unsalted popcorn and pretzels. The items listed above may not be a complete list of recommended foods or beverages. Contact your dietitian for more options. WHAT FOODS ARE NOT RECOMMENDED? Grains White bread. White pasta. White rice. Refined cornbread. Bagels and croissants. Crackers that contain trans fat. Vegetables Creamed or fried vegetables. Vegetables in a cheese sauce. Regular canned vegetables. Regular canned tomato sauce and paste. Regular tomato and vegetable juices. Fruits Dried fruits. Canned fruit in light or heavy syrup. Fruit juice. Meat and Other Protein Products Fatty cuts of meat. Ribs, chicken wings, bacon, sausage, bologna, salami, chitterlings, fatback, hot dogs, bratwurst, and packaged luncheon meats. Salted nuts and seeds. Canned beans with salt. Dairy Whole or 2% milk, cream, half-and-half, and cream cheese. Whole-fat or sweetened yogurt. Full-fat cheeses or blue cheese. Nondairy creamers and whipped toppings. Processed cheese, cheese spreads, or cheese curds. Condiments Onion and garlic salt, seasoned salt, table salt, and sea salt. Canned and packaged gravies. Worcestershire sauce. Tartar sauce. Barbecue sauce. Teriyaki sauce. Soy sauce, including reduced sodium. Steak sauce. Fish sauce. Oyster sauce. Cocktail sauce. Horseradish. Ketchup and mustard. Meat flavorings and tenderizers. Bouillon cubes. Hot sauce. Tabasco sauce. Marinades. Taco seasonings. Relishes. Fats and Oils Butter, stick margarine, lard, shortening, ghee, and bacon fat. Coconut, palm kernel, or palm oils.  Regular salad dressings. Other Pickles and olives. Salted popcorn and pretzels. The items listed above may not be a complete list of foods and beverages to avoid. Contact your dietitian for more information. WHERE CAN I FIND MORE INFORMATION? National Heart, Lung, and Blood Institute: travelstabloid.com   This information is not intended to replace advice given to you by your health care provider. Make sure you discuss any questions you have with your health care provider.   Document Released: 04/23/2011 Document Revised: 05/25/2014 Document Reviewed: 03/08/2013 Elsevier Interactive Patient Education 2016 [employer].  Hypokalemia Hypokalemia means that the amount of potassium in the blood is lower than normal.Potassium is a chemical, called an electrolyte, that helps regulate the amount of fluid in the body. It also stimulates muscle contraction and helps nerves function properly.Most of the body's potassium is inside of cells, and only a very small amount is in the blood. Because the amount in the blood is so small, minor changes can be life-threatening. CAUSES  Antibiotics.  Diarrhea or vomiting.  Using laxatives too much, which can cause diarrhea.  Chronic kidney disease.  Water pills (diuretics).  Eating disorders (bulimia).  Low magnesium level.  Sweating a lot. SIGNS AND SYMPTOMS  Weakness.  Constipation.  Fatigue.  Muscle cramps.  Mental confusion.  Skipped heartbeats or irregular heartbeat (palpitations).  Tingling or numbness. DIAGNOSIS  Your health care provider can diagnose hypokalemia with blood tests. In addition to checking your potassium level, your health care provider may also check other lab tests.  TREATMENT Hypokalemia can be treated with potassium supplements taken by mouth or adjustments in your current medicines. If your potassium level is very low, you may need to get potassium through a vein (IV) and be  monitored in the hospital. A diet high in potassium is also helpful. Foods high in potassium are:  Nuts, such as peanuts and pistachios.  Seeds, such as sunflower seeds and pumpkin seeds.  Peas, lentils, and lima beans.  Whole grain and bran cereals and breads.  Fresh fruit and vegetables, such as apricots, avocado, bananas, cantaloupe, kiwi, oranges, tomatoes, asparagus, and potatoes.  Orange and tomato juices.  Red meats.  Fruit yogurt. HOME CARE INSTRUCTIONS  Take all medicines as prescribed by your health care provider.  Maintain a healthy diet by including nutritious food, such as fruits, vegetables, nuts, whole grains, and lean meats.  If you are taking a laxative, be sure to follow the directions on the label. SEEK MEDICAL CARE IF:  Your weakness gets worse.  You feel your heart pounding or racing.  You are vomiting or having diarrhea.  You are diabetic and having trouble keeping your blood glucose in the normal range. SEEK IMMEDIATE MEDICAL CARE IF:  You have chest pain, shortness of breath, or dizziness.  You are vomiting or having diarrhea for more than 2 days.  You faint. MAKE SURE YOU:   Understand these instructions.  Will watch your condition.  Will get help right away if you are not doing well or get worse.   This information is not intended to replace advice given to you by your health care provider. Make sure you discuss any questions you have with your health care provider.   Document Released: 05/04/2005 Document Revised: 05/25/2014 Document Reviewed: 11/04/2012 Elsevier Interactive Patient Education 2016 [employer].   How to Take Your Blood Pressure HOW DO I GET A BLOOD PRESSURE MACHINE?  You can buy an electronic home blood pressure machine at your local pharmacy. Insurance will sometimes cover the cost if you have a prescription.  Ask your doctor what type of machine is best for you. There are different machines for your arm and your  wrist.  If you decide to buy a machine to check your blood pressure on your arm, first check the size of your arm so you can buy the right size cuff. To check the size of your arm:   Use a measuring tape that shows both inches and centimeters.   Wrap the measuring tape around the upper-middle part of your arm. You may need someone to help you measure.   Write down your arm measurement in both inches and centimeters.   To measure your blood pressure correctly, it is important to have the right size cuff.   If your arm is up to 13 inches (up to 34 centimeters), get an adult cuff size.  If your arm is 13 to 17 inches (35 to 44 centimeters), get a large adult cuff size.    If your arm is 17 to 20 inches (45 to 52 centimeters), get an adult thigh cuff.  WHAT DO THE NUMBERS MEAN?   There are two numbers that make up your blood pressure. For example: 120/80.  The first number (120 in our example) is called the "systolic pressure." It is a measure of the pressure in your blood vessels when your heart is pumping blood.  The second number (80 in our example) is called the "diastolic pressure." It is a measure of the pressure  in your blood vessels when your heart is resting between beats.  Your doctor will tell you what your blood pressure should be. WHAT SHOULD I DO BEFORE I CHECK MY BLOOD PRESSURE?   Try to rest or relax for at least 30 minutes before you check your blood pressure.  Do not smoke.  Do not have any drinks with caffeine, such as:  Soda.  Coffee.  Tea.  Check your blood pressure in a quiet room.  Sit down and stretch out your arm on a table. Keep your arm at about the level of your heart. Let your arm relax.  Make sure that your legs are not crossed. HOW DO I CHECK MY BLOOD PRESSURE?  Follow the directions that came with your machine.  Make sure you remove any tight-fitting clothing from your arm or wrist. Wrap the cuff around your upper arm or wrist. You  should be able to fit a finger between the cuff and your arm. If you cannot fit a finger between the cuff and your arm, it is too tight and should be removed and rewrapped.  Some units require you to manually pump up the arm cuff.  Automatic units inflate the cuff when you press a button.  Cuff deflation is automatic in both models.  After the cuff is inflated, the unit measures your blood pressure and pulse. The readings are shown on a monitor. Hold still and breathe normally while the cuff is inflated.  Getting a reading takes less than a minute.  Some models store readings in a memory. Some provide a printout of readings. If your machine does not store your readings, keep a written record.  Take readings with you to your next visit with your doctor.   This information is not intended to replace advice given to you by your health care provider. Make sure you discuss any questions you have with your health care provider.   Document Released: 04/16/2008 Document Revised: 05/25/2014 Document Reviewed: 06/29/2013 Elsevier Interactive Patient Education 2016 [place] Your High Blood Pressure Blood pressure is a measurement of how forceful your blood is pressing against the walls of the arteries. Arteries are muscular tubes within the circulatory system. Blood pressure does not stay the same. Blood pressure rises when you are active, excited, or nervous; and it lowers during sleep and relaxation. If the numbers measuring your blood pressure stay above normal most of the time, you are at risk for health problems. High blood pressure (hypertension) is a long-term (chronic) condition in which blood pressure is elevated. A blood pressure reading is recorded as two numbers, such as 120 over 80 (or 120/80). The first, higher number is called the systolic pressure. It is a measure of the pressure in your arteries as the heart beats. The second, lower number is called the diastolic  pressure. It is a measure of the pressure in your arteries as the heart relaxes between beats.  Keeping your blood pressure in a normal range is important to your overall health and prevention of health problems, such as heart disease and stroke. When your blood pressure is uncontrolled, your heart has to work harder than normal. High blood pressure is a very common condition in adults because blood pressure tends to rise with age. Men and women are equally likely to have hypertension but at different times in life. Before age 3, men are more likely to have hypertension. After 69 years of age, women are more likely to have it. Hypertension  is especially common in African Americans. This condition often has no signs or symptoms. The cause of the condition is usually not known. Your caregiver can help you come up with a plan to keep your blood pressure in a normal, healthy range. BLOOD PRESSURE STAGES Blood pressure is classified into four stages: normal, prehypertension, stage 1, and stage 2. Your blood pressure reading will be used to determine what type of treatment, if any, is necessary. Appropriate treatment options are tied to these four stages:  Normal  Systolic pressure (mm Hg): below 120.  Diastolic pressure (mm Hg): below 80. Prehypertension  Systolic pressure (mm Hg): 120 to 139.  Diastolic pressure (mm Hg): 80 to 89. Stage1  Systolic pressure (mm Hg): 140 to 159.  Diastolic pressure (mm Hg): 90 to 99. Stage2  Systolic pressure (mm Hg): 160 or above.  Diastolic pressure (mm Hg): 100 or above. RISKS RELATED TO HIGH BLOOD PRESSURE Managing your blood pressure is an important responsibility. Uncontrolled high blood pressure can lead to:  A heart attack.  A stroke.  A weakened blood vessel (aneurysm).  Heart failure.  Kidney damage.  Eye damage.  Metabolic syndrome.  Memory and concentration problems. HOW TO MANAGE YOUR BLOOD PRESSURE Blood pressure can be managed  effectively with lifestyle changes and medicines (if needed). Your caregiver will help you come up with a plan to bring your blood pressure within a normal range. Your plan should include the following: Education  Read all information provided by your caregivers about how to control blood pressure.  Educate yourself on the latest guidelines and treatment recommendations. New research is always being done to further define the risks and treatments for high blood pressure. Lifestylechanges  Control your weight.  Avoid smoking.  Stay physically active.  Reduce the amount of salt in your diet.  Reduce stress.  Control any chronic conditions, such as high cholesterol or diabetes.  Reduce your alcohol intake. Medicines  Several medicines (antihypertensive medicines) are available, if needed, to bring blood pressure within a normal range. Communication  Review all the medicines you take with your caregiver because there may be side effects or interactions.  Talk with your caregiver about your diet, exercise habits, and other lifestyle factors that may be contributing to high blood pressure.  See your caregiver regularly. Your caregiver can help you create and adjust your plan for managing high blood pressure. RECOMMENDATIONS FOR TREATMENT AND FOLLOW-UP  The following recommendations are based on current guidelines for managing high blood pressure in nonpregnant adults. Use these recommendations to identify the proper follow-up period or treatment option based on your blood pressure reading. You can discuss these options with your caregiver.  Systolic pressure of 123456 to [id number] or diastolic pressure of 80 to 89: Follow up with your caregiver as directed.  Systolic pressure of [id number] to 0000000 or diastolic pressure of 90 to 100: Follow up with your caregiver within 2 months.  Systolic pressure above 0000000 or diastolic pressure above [id number]: Follow up with your caregiver within 1 month.  Systolic  pressure above [id number] or diastolic pressure above [id number]: Consider antihypertensive therapy; follow up with your caregiver within 1 week.  Systolic pressure above [id number] or diastolic pressure above 123456: Begin antihypertensive therapy; follow up with your caregiver within 1 week.   This information is not intended to replace advice given to you by your health care provider. Make sure you discuss any questions you have with your health care provider.   Document Released: 01/27/2012 Document Reviewed:  01/27/2012 Elsevier Interactive Patient Education 2016 [place].  Potassium Content of Foods Potassium is a mineral found in many foods and drinks. It helps keep fluids and minerals balanced in your body and affects how steadily your heart beats. Potassium also helps control your blood pressure and keep your muscles and nervous system healthy. Certain health conditions and medicines may change the balance of potassium in your body. When this happens, you can help balance your level of potassium through the foods that you do or do not eat. Your health care provider or dietitian may recommend an amount of potassium that you should have each day. The following lists of foods provide the amount of potassium (in parentheses) per serving in each item. HIGH IN POTASSIUM  The following foods and beverages have 200 mg or more of potassium per serving:  Apricots, 2 raw or 5 dry (200 mg).  Artichoke, 1 medium (345 mg).  Avocado, raw,  each (245 mg).  Banana, 1 medium (425 mg).  Beans, lima, or baked beans, canned,  cup (280 mg).  Beans, white, canned,  cup (595 mg).  Beef roast, 3 oz (320 mg).  Beef, ground, 3 oz (270 mg).  Beets, raw or cooked,  cup (260 mg).  Bran muffin, 2 oz (300 mg).  Broccoli,  cup (230 mg).  Brussels sprouts,  cup (250 mg).  Cantaloupe,  cup (215 mg).  Cereal, 100% bran,  cup (200-400 mg).  Cheeseburger, single, fast food, 1 each (225-400 mg).  Chicken, 3 oz  (220 mg).  Clams, canned, 3 oz (535 mg).  Crab, 3 oz (225 mg).  Dates, 5 each (270 mg).  Dried beans and peas,  cup (300-475 mg).  Figs, dried, 2 each (260 mg).  Fish: halibut, tuna, cod, snapper, 3 oz (480 mg).  Fish: salmon, haddock, swordfish, perch, 3 oz (300 mg).  Fish, tuna, canned 3 oz (200 mg).  [place] fries, fast food, 3 oz (470 mg).  Granola with fruit and nuts,  cup (200 mg).  Grapefruit juice,  cup (200 mg).  Greens, beet,  cup (655 mg).  Honeydew melon,  cup (200 mg).  Kale, raw, 1 cup (300 mg).  Kiwi, 1 medium (240 mg).  Kohlrabi, rutabaga, parsnips,  cup (280 mg).  Lentils,  cup (365 mg).  Mango, 1 each (325 mg).  Milk, chocolate, 1 cup (420 mg).  Milk: nonfat, low-fat, whole, buttermilk, 1 cup (350-380 mg).  Molasses, 1 Tbsp (295 mg).  Mushrooms,  cup (280) mg.  Nectarine, 1 each (275 mg).  Nuts: almonds, peanuts, hazelnuts, [place], cashew, mixed, 1 oz (200 mg).  Nuts, pistachios, 1 oz (295 mg).  Orange, 1 each (240 mg).  Orange juice,  cup (235 mg).  Papaya, medium,  fruit (390 mg).  Peanut butter, chunky, 2 Tbsp (240 mg).  Peanut butter, smooth, 2 Tbsp (210 mg).  Pear, 1 medium (200 mg).  Pomegranate, 1 whole (400 mg).  Pomegranate juice,  cup (215 mg).  Pork, 3 oz (350 mg).  Potato chips, salted, 1 oz (465 mg).  Potato, baked with skin, 1 medium (925 mg).  Potatoes, boiled,  cup (255 mg).  Potatoes, mashed,  cup (330 mg).  Prune juice,  cup (370 mg).  Prunes, 5 each (305 mg).  Pudding, chocolate,  cup (230 mg).  Pumpkin, canned,  cup (250 mg).  Raisins, seedless,  cup (270 mg).  Seeds, sunflower or pumpkin, 1 oz (240 mg).  Soy milk, 1 cup (300 mg).  Spinach,  cup (420  mg).  Spinach, canned,  cup (370 mg).  Sweet potato, baked with skin, 1 medium (450 mg).  Swiss chard,  cup (480 mg).  Tomato or vegetable juice,  cup (275 mg).  Tomato sauce or puree,  cup (400-550  mg).  Tomato, raw, 1 medium (290 mg).  Tomatoes, canned,  cup (200-300 mg).  [place], 3 oz (250 mg).  Wheat germ, 1 oz (250 mg).  Winter squash,  cup (250 mg).  Yogurt, plain or fruited, 6 oz (260-435 mg).  Zucchini,  cup (220 mg). MODERATE IN POTASSIUM The following foods and beverages have 50-200 mg of potassium per serving:  Apple, 1 each (150 mg).  Apple juice,  cup (150 mg).  Applesauce,  cup (90 mg).  Apricot nectar,  cup (140 mg).  Asparagus, small spears,  cup or 6 spears (155 mg).  Bagel, cinnamon raisin, 1 each (130 mg).  Bagel, egg or plain, 4 in., 1 each (70 mg).  Beans, green,  cup (90 mg).  Beans, yellow,  cup (190 mg).  Beer, regular, 12 oz (100 mg).  Beets, canned,  cup (125 mg).  Blackberries,  cup (115 mg).  Blueberries,  cup (60 mg).  Bread, whole wheat, 1 slice (70 mg).  Broccoli, raw,  cup (145 mg).  Cabbage,  cup (150 mg).  Carrots, cooked or raw,  cup (180 mg).  Cauliflower, raw,  cup (150 mg).  Celery, raw,  cup (155 mg).  Cereal, bran flakes, cup (120-150 mg).  Cheese, cottage,  cup (110 mg).  Cherries, 10 each (150 mg).  Chocolate, 1 oz bar (165 mg).  Coffee, brewed 6 oz (90 mg).  Corn,  cup or 1 ear (195 mg).  Cucumbers,  cup (80 mg).  Egg, large, 1 each (60 mg).  Eggplant,  cup (60 mg).  Endive, raw, cup (80 mg).  English muffin, 1 each (65 mg).  Fish, orange roughy, 3 oz (150 mg).  Frankfurter, beef or pork, 1 each (75 mg).  Fruit cocktail,  cup (115 mg).  Grape juice,  cup (170 mg).  Grapefruit,  fruit (175 mg).  Grapes,  cup (155 mg).  Greens: kale, turnip, collard,  cup (110-150 mg).  Ice cream or frozen yogurt, chocolate,  cup (175 mg).  Ice cream or frozen yogurt, vanilla,  cup (120-150 mg).  Lemons, limes, 1 each (80 mg).  Lettuce, all types, 1 cup (100 mg).  Mixed vegetables,  cup (150 mg).  Mushrooms, raw,  cup (110 mg).  Nuts: walnuts, pecans,  or macadamia, 1 oz (125 mg).  Oatmeal,  cup (80 mg).  Okra,  cup (110 mg).  Onions, raw,  cup (120 mg).  Peach, 1 each (185 mg).  Peaches, canned,  cup (120 mg).  Pears, canned,  cup (120 mg).  Peas, green, frozen,  cup (90 mg).  Peppers, green,  cup (130 mg).  Peppers, red,  cup (160 mg).  Pineapple juice,  cup (165 mg).  Pineapple, fresh or canned,  cup (100 mg).  Plums, 1 each (105 mg).  Pudding, vanilla,  cup (150 mg).  Raspberries,  cup (90 mg).  Rhubarb,  cup (115 mg).  Rice, wild,  cup (80 mg).  Shrimp, 3 oz (155 mg).  Spinach, raw, 1 cup (170 mg).  Strawberries,  cup (125 mg).  Summer squash  cup (175-200 mg).  Swiss chard, raw, 1 cup (135 mg).  Tangerines, 1 each (140 mg).  Tea, brewed, 6 oz (65 mg).  Turnips,  cup (140 mg).  Watermelon,  cup (85 mg).  Wine, red, table, 5 oz (180 mg).  Wine, white, table, 5 oz (100 mg). LOW IN POTASSIUM The following foods and beverages have less than 50 mg of potassium per serving.  Bread, white, 1 slice (30 mg).  Carbonated beverages, 12 oz (less than 5 mg).  Cheese, 1 oz (20-30 mg).  Cranberries,  cup (45 mg).  Cranberry juice cocktail,  cup (20 mg).  Fats and oils, 1 Tbsp (less than 5 mg).  Hummus, 1 Tbsp (32 mg).  Nectar: papaya, mango, or pear,  cup (35 mg).  Rice, white or brown,  cup (50 mg).  Spaghetti or macaroni,  cup cooked (30 mg).  Tortilla, flour or corn, 1 each (50 mg).  Waffle, 4 in., 1 each (50 mg).  Water chestnuts,  cup (40 mg).   This information is not intended to replace advice given to you by your health care provider. Make sure you discuss any questions you have with your health care provider.   Document Released: 12/16/2004 Document Revised: 05/09/2013 Document Reviewed: 03/31/2013 Elsevier Interactive Patient Education [employer].

## 2016-02-03 NOTE — Progress Notes (Signed)
Patient alert and oriented with pain controlled. Patient given discharge instructions and all questions and concerns answered. Patient verbalized understanding of all instructions.

## 2016-02-10 ENCOUNTER — Other Ambulatory Visit: Payer: Self-pay | Admitting: Neurosurgery

## 2016-02-10 DIAGNOSIS — M4722 Other spondylosis with radiculopathy, cervical region: Secondary | ICD-10-CM | POA: Insufficient documentation

## 2016-02-10 DIAGNOSIS — G5600 Carpal tunnel syndrome, unspecified upper limb: Secondary | ICD-10-CM | POA: Insufficient documentation

## 2016-02-22 ENCOUNTER — Ambulatory Visit
Admission: RE | Admit: 2016-02-22 | Discharge: 2016-02-22 | Disposition: A | Discharge status: Home / Self Care | Payer: Medicare Other | Source: Ambulatory Visit | Attending: Neurosurgery | Admitting: Neurosurgery

## 2016-02-22 DIAGNOSIS — M4722 Other spondylosis with radiculopathy, cervical region: Secondary | ICD-10-CM

## 2016-03-11 ENCOUNTER — Ambulatory Visit: Payer: Medicare Other | Admitting: Podiatry

## 2016-03-18 ENCOUNTER — Ambulatory Visit (INDEPENDENT_AMBULATORY_CARE_PROVIDER_SITE_OTHER): Payer: Medicare Other | Admitting: Podiatry

## 2016-03-18 ENCOUNTER — Encounter: Payer: Self-pay | Admitting: Podiatry

## 2016-03-18 VITALS — BP 188/97 | HR 64 | Resp 18

## 2016-03-18 DIAGNOSIS — M5020 Other cervical disc displacement, unspecified cervical region: Secondary | ICD-10-CM | POA: Diagnosis present

## 2016-03-18 DIAGNOSIS — B351 Tinea unguium: Secondary | ICD-10-CM | POA: Diagnosis not present

## 2016-03-18 DIAGNOSIS — M79675 Pain in left toe(s): Secondary | ICD-10-CM

## 2016-03-18 DIAGNOSIS — M79674 Pain in right toe(s): Secondary | ICD-10-CM | POA: Diagnosis not present

## 2016-03-18 DIAGNOSIS — Q828 Other specified congenital malformations of skin: Secondary | ICD-10-CM | POA: Diagnosis not present

## 2016-03-18 NOTE — Progress Notes (Signed)
Patient ID: Angela Pugh, female   DOB: 09-15-1945, 70 y.o.   MRN: [id number]    Subjective: This patient presents for scheduled visit complaining of uncomfortable toenails walking wearing shoes and requests toenail debridement. Also, patient is complaining of multiple painful plantar calluses on the right and left feet and requests debridement  Patient is not taking any medication currently for diabetes  Objective: Orientated 3 DP and PT pulses 2/4 bilaterally Reflex immediate bilaterally Sensation to 10 g monofilament wire intact 5/5 bilaterally Vibratory sensation reactive bilaterally Ankle reflex equal reactive bilaterally HAV bilaterally No open skin lesions bilaterally The toenails elongated, brittle, deformed, discolored tender to direct palpation 6-10 Nucleated plantar keratoses third MPJ bilaterally and hallux  Assessment: Symptomatic onychomycoses 6-10 Porokeratosis 3 History of diabetes not currently taking medication and is diet-controlled  Plan: Debridement toenails 6-10 mechanically and electrically without any bleeding Debride porokeratosis 3 without any bleeding and apply salinocaine   Reappoint 3 months

## 2016-03-18 NOTE — Patient Instructions (Signed)
Diabetes and Foot Care Diabetes may cause you to have problems because of poor blood supply (circulation) to your feet and legs. This may cause the skin on your feet to become thinner, break easier, and heal more slowly. Your skin may become dry, and the skin may peel and crack. You may also have nerve damage in your legs and feet causing decreased feeling in them. You may not notice minor injuries to your feet that could lead to infections or more serious problems. Taking care of your feet is one of the most important things you can do for yourself.  HOME CARE INSTRUCTIONS  Wear shoes at all times, even in the house. Do not go barefoot. Bare feet are easily injured.  Check your feet daily for blisters, cuts, and redness. If you cannot see the bottom of your feet, use a mirror or ask someone for help.  Wash your feet with warm water (do not use hot water) and mild soap. Then pat your feet and the areas between your toes until they are completely dry. Do not soak your feet as this can dry your skin.  Apply a moisturizing lotion or petroleum jelly (that does not contain alcohol and is unscented) to the skin on your feet and to dry, brittle toenails. Do not apply lotion between your toes.  Trim your toenails straight across. Do not dig under them or around the cuticle. File the edges of your nails with an emery board or nail file.  Do not cut corns or calluses or try to remove them with medicine.  Wear clean socks or stockings every day. Make sure they are not too tight. Do not wear knee-high stockings since they may decrease blood flow to your legs.  Wear shoes that fit properly and have enough cushioning. To break in new shoes, wear them for just a few hours a day. This prevents you from injuring your feet. Always look in your shoes before you put them on to be sure there are no objects inside.  Do not cross your legs. This may decrease the blood flow to your feet.  If you find a minor scrape,  cut, or break in the skin on your feet, keep it and the skin around it clean and dry. These areas may be cleansed with mild soap and water. Do not cleanse the area with peroxide, alcohol, or iodine.  When you remove an adhesive bandage, be sure not to damage the skin around it.  If you have a wound, look at it several times a day to make sure it is healing.  Do not use heating pads or hot water bottles. They may burn your skin. If you have lost feeling in your feet or legs, you may not know it is happening until it is too late.  Make sure your health care provider performs a complete foot exam at least annually or more often if you have foot problems. Report any cuts, sores, or bruises to your health care provider immediately. SEEK MEDICAL CARE IF:   You have an injury that is not healing.  You have cuts or breaks in the skin.  You have an ingrown nail.  You notice redness on your legs or feet.  You feel burning or tingling in your legs or feet.  You have pain or cramps in your legs and feet.  Your legs or feet are numb.  Your feet always feel cold. SEEK IMMEDIATE MEDICAL CARE IF:   There is increasing redness,   swelling, or pain in or around a wound.  There is a red line that goes up your leg.  Pus is coming from a wound.  You develop a fever or as directed by your health care provider.  You notice a bad smell coming from an ulcer or wound.   This information is not intended to replace advice given to you by your health care provider. Make sure you discuss any questions you have with your health care provider.   Document Released: 05/01/2000 Document Revised: 01/04/2013 Document Reviewed: 10/11/2012 Elsevier Interactive Patient Education 2016 [employer]  

## 2016-03-19 ENCOUNTER — Other Ambulatory Visit: Payer: Self-pay | Admitting: Neurosurgery

## 2016-04-14 ENCOUNTER — Encounter (HOSPITAL_COMMUNITY): Payer: Self-pay

## 2016-04-14 ENCOUNTER — Encounter (HOSPITAL_COMMUNITY)
Admission: RE | Admit: 2016-04-14 | Discharge: 2016-04-14 | Disposition: A | Discharge status: Home / Self Care | Payer: Medicare Other | Source: Ambulatory Visit | Attending: Neurosurgery | Admitting: Neurosurgery

## 2016-04-14 DIAGNOSIS — Z01812 Encounter for preprocedural laboratory examination: Secondary | ICD-10-CM

## 2016-04-14 DIAGNOSIS — E119 Type 2 diabetes mellitus without complications: Secondary | ICD-10-CM

## 2016-04-14 HISTORY — DX: Personal history of urinary calculi: Z87.442

## 2016-04-14 LAB — SURGICAL PCR SCREEN
MRSA, PCR: NEGATIVE
Staphylococcus aureus: NEGATIVE

## 2016-04-14 LAB — CBC
HCT: 40.8 % (ref 36.0–46.0)
Hemoglobin: 13.8 g/dL (ref 12.0–15.0)
MCH: 29.2 pg (ref 26.0–34.0)
MCHC: 33.8 g/dL (ref 30.0–36.0)
MCV: 86.4 fL (ref 78.0–100.0)
PLATELETS: 262 10*3/uL (ref 150–400)
RBC: 4.72 MIL/uL (ref 3.87–5.11)
RDW: 18.7 % — AB (ref 11.5–15.5)
WBC: 6.6 10*3/uL (ref 4.0–10.5)

## 2016-04-14 LAB — BASIC METABOLIC PANEL
Anion gap: 8 (ref 5–15)
BUN: 10 mg/dL (ref 6–20)
CHLORIDE: 106 mmol/L (ref 101–111)
CO2: 25 mmol/L (ref 22–32)
CREATININE: 0.84 mg/dL (ref 0.44–1.00)
Calcium: 9.9 mg/dL (ref 8.9–10.3)
GFR calc Af Amer: >60 mL/min (ref >60)
GFR calc non Af Amer: >60 mL/min (ref >60)
Glucose, Bld: 118 mg/dL — ABNORMAL HIGH (ref 65–99)
Potassium: 3.3 mmol/L — ABNORMAL LOW (ref 3.5–5.1)
Sodium: 139 mmol/L (ref 135–145)

## 2016-04-14 LAB — GLUCOSE, CAPILLARY: Glucose-Capillary: 101 mg/dL — ABNORMAL HIGH (ref 65–99)

## 2016-04-14 NOTE — Pre-Procedure Instructions (Signed)
Angela Pugh  04/14/2016      [id number] - [address], [place] [address] Phone: [phone] Fax: [phone]    Your procedure is scheduled on Friday, December 1st, 2017.  Report to [facility] Admitting at 8:30 A.M.   Call this number if you have problems the morning of surgery:  [phone]   Remember:  Do not eat food or drink liquids after midnight.   Take these medicines the morning of surgery with A SIP OF WATER: Eye drops if needed.  Stop taking: Goody Headache Powder, Hydrocodone-ibuprofen (Vicoprofen), Aspirin, NSAIDS, Aleve, Naproxen, Ibuprofen, Advil, Motrin BC's, Goody's, Fish oil, all herbal medications, and all vitamins.     How to Manage Your Diabetes Before and After Surgery  Why is it important to control my blood sugar before and after surgery? . Improving blood sugar levels before and after surgery helps healing and can limit problems. . A way of improving blood sugar control is eating a healthy diet by: o  Eating less sugar and carbohydrates o  Increasing activity/exercise o  Talking with your doctor about reaching your blood sugar goals . High blood sugars (greater than 180 mg/dL) can raise your risk of infections and slow your recovery, so you will need to focus on controlling your diabetes during the weeks before surgery. . Make sure that the doctor who takes care of your diabetes knows about your planned surgery including the date and location.  How do I manage my blood sugar before surgery? . Check your blood sugar at least 4 times a day, starting 2 days before surgery, to make sure that the level is not too high or low. o Check your blood sugar the morning of your surgery when you wake up and every 2 hours until you get to the Short Stay unit. . If your blood sugar is less than 70 mg/dL, you will need to treat for low blood  sugar: o Do not take insulin. o Treat a low blood sugar (less than 70 mg/dL) with  cup of clear juice (cranberry or apple), 4 glucose tablets, OR glucose gel. o Recheck blood sugar in 15 minutes after treatment (to make sure it is greater than 70 mg/dL). If your blood sugar is not greater than 70 mg/dL on recheck, call [phone] for further instructions. . Report your blood sugar to the short stay nurse when you get to Short Stay.  . If you are admitted to the hospital after surgery: o Your blood sugar will be checked by the staff and you will probably be given insulin after surgery (instead of oral diabetes medicines) to make sure you have good blood sugar levels. o The goal for blood sugar control after surgery is 80-180 mg/dL.     Do not wear jewelry, make-up or nail polish.  Do not wear lotions, powders, or perfumes, or deoderant.  Do not shave 48 hours prior to surgery.    Do not bring valuables to the hospital.  [facility] is not responsible for any belongings or valuables.  Contacts, dentures or bridgework may not be worn into surgery.  Leave your suitcase in the car.  After surgery it may be brought to your room.  For patients admitted to the hospital, discharge time will be determined by your treatment team.  Patients discharged the day of surgery will not be allowed to  drive home.   Special instructions:  Preparing for Surgery.   [place]- Preparing For Surgery  Before surgery, you can play an important role. Because skin is not sterile, your skin needs to be as free of germs as possible. You can reduce the number of germs on your skin by washing with CHG (chlorahexidine gluconate) Soap before surgery.  CHG is an antiseptic cleaner which kills germs and bonds with the skin to continue killing germs even after washing.  Please do not use if you have an allergy to CHG or antibacterial soaps. If your skin becomes reddened/irritated stop using the CHG.  Do not shave  (including legs and underarms) for at least 48 hours prior to first CHG shower. It is OK to shave your face.  Please follow these instructions carefully.   1. Shower the NIGHT BEFORE SURGERY and the MORNING OF SURGERY with CHG.   2. If you chose to wash your hair, wash your hair first as usual with your normal shampoo.  3. After you shampoo, rinse your hair and body thoroughly to remove the shampoo.  4. Use CHG as you would any other liquid soap. You can apply CHG directly to the skin and wash gently with a scrungie or a clean washcloth.   5. Apply the CHG Soap to your body ONLY FROM THE NECK DOWN.  Do not use on open wounds or open sores. Avoid contact with your eyes, ears, mouth and genitals (private parts). Wash genitals (private parts) with your normal soap.  6. Wash thoroughly, paying special attention to the area where your surgery will be performed.  7. Thoroughly rinse your body with warm water from the neck down.  8. DO NOT shower/wash with your normal soap after using and rinsing off the CHG Soap.  9. Pat yourself dry with a CLEAN TOWEL.   10. Wear CLEAN PAJAMAS   11. Place CLEAN SHEETS on your bed the night of your first shower and DO NOT SLEEP WITH PETS.  Day of Surgery: Do not apply any deodorants/lotions. Please wear clean clothes to the hospital/surgery center.     Please read over the following fact sheets that you were given. MRSA Information

## 2016-04-14 NOTE — Progress Notes (Signed)
PCP/Cardiologist - Dr. Wallene Huh EKG- 02/02/16 CXR - denies Echo- denies Stress test - 05/2015 Cardiac Cath - denies  Patient denies chest pain and shortness of breath at PAT appointment.    Patient informed nurse that she checks her blood sugar approximately 3 times a week and that her fasting glucose is in the 90's.

## 2016-04-15 LAB — HEMOGLOBIN A1C
Hgb A1c MFr Bld: 6.2 % — ABNORMAL HIGH (ref 4.8–5.6)
MEAN PLASMA GLUCOSE: 131 mg/dL

## 2016-04-15 NOTE — Progress Notes (Signed)
Anesthesia Chart Review:  Pt is a 70 year old female scheduled for C3-4 ACDF, C7-T1 posterior cervical discectomy on 04/17/2016 with Ashok Pall, MD.   - PCP/cardiologist is Wallene Huh, MD  PMH includes:  HTN, DM, hyperlipidemia. Former smoker. BMI 26. S/p excision lumbar herniated nucleus pulposus 06/30/13.   Medications include: amlodipine, iron, losartan  Preoperative labs reviewed.  HgbA1c 6.2, glucose 118  EKG 02/01/16: Sinus rhythm. LBBB. LBBB is present on prior EKG dated 01/16/15.   Nuclear stress test 06/01/15:  1. No reversible ischemia or infarction. 2. Mild left ventricular dilatation and mild global hypokinesis. 3. Left ventricular ejection fraction 45% 4. Intermediate-risk stress test findings  Reviewed case with Dr. Kalman Shan.  If no changes, I anticipate pt can proceed with surgery as scheduled.   Willeen Cass, FNP-BC [facility] Short Stay Surgical Center/Anesthesiology Phone: [phone] 04/15/2016 4:44 PM

## 2016-04-17 ENCOUNTER — Inpatient Hospital Stay (HOSPITAL_COMMUNITY): Payer: Medicare Other | Admitting: Emergency Medicine

## 2016-04-17 ENCOUNTER — Inpatient Hospital Stay (HOSPITAL_COMMUNITY): Payer: Medicare Other

## 2016-04-17 ENCOUNTER — Encounter (HOSPITAL_COMMUNITY)
Admission: RE | Disposition: A | Discharge status: Home / Self Care | Payer: Self-pay | Source: Ambulatory Visit | Attending: Neurosurgery

## 2016-04-17 ENCOUNTER — Inpatient Hospital Stay (HOSPITAL_COMMUNITY): Payer: Medicare Other | Admitting: Certified Registered Nurse Anesthetist

## 2016-04-17 ENCOUNTER — Encounter (HOSPITAL_COMMUNITY): Payer: Self-pay | Admitting: Certified Registered Nurse Anesthetist

## 2016-04-17 ENCOUNTER — Inpatient Hospital Stay (HOSPITAL_COMMUNITY)
Admission: RE | Admit: 2016-04-17 | Discharge: 2016-04-20 | DRG: 455 | Disposition: A | Discharge status: Home / Self Care | Payer: Medicare Other | Source: Ambulatory Visit | Attending: Neurosurgery | Admitting: Neurosurgery

## 2016-04-17 DIAGNOSIS — Z87891 Personal history of nicotine dependence: Secondary | ICD-10-CM | POA: Diagnosis not present

## 2016-04-17 DIAGNOSIS — Z8049 Family history of malignant neoplasm of other genital organs: Secondary | ICD-10-CM

## 2016-04-17 DIAGNOSIS — I10 Essential (primary) hypertension: Secondary | ICD-10-CM | POA: Diagnosis present

## 2016-04-17 DIAGNOSIS — M4312 Spondylolisthesis, cervical region: Secondary | ICD-10-CM | POA: Diagnosis present

## 2016-04-17 DIAGNOSIS — Z833 Family history of diabetes mellitus: Secondary | ICD-10-CM | POA: Diagnosis not present

## 2016-04-17 DIAGNOSIS — Z419 Encounter for procedure for purposes other than remedying health state, unspecified: Secondary | ICD-10-CM

## 2016-04-17 DIAGNOSIS — M5020 Other cervical disc displacement, unspecified cervical region: Secondary | ICD-10-CM | POA: Diagnosis present

## 2016-04-17 DIAGNOSIS — E119 Type 2 diabetes mellitus without complications: Secondary | ICD-10-CM | POA: Diagnosis present

## 2016-04-17 DIAGNOSIS — M5001 Cervical disc disorder with myelopathy,  high cervical region: Principal | ICD-10-CM | POA: Diagnosis present

## 2016-04-17 DIAGNOSIS — M4802 Spinal stenosis, cervical region: Secondary | ICD-10-CM | POA: Diagnosis present

## 2016-04-17 DIAGNOSIS — M5000 Cervical disc disorder with myelopathy, unspecified cervical region: Secondary | ICD-10-CM | POA: Diagnosis present

## 2016-04-17 DIAGNOSIS — Z8249 Family history of ischemic heart disease and other diseases of the circulatory system: Secondary | ICD-10-CM

## 2016-04-17 DIAGNOSIS — M79602 Pain in left arm: Secondary | ICD-10-CM | POA: Diagnosis present

## 2016-04-17 HISTORY — PX: ANTERIOR CERVICAL DECOMP/DISCECTOMY FUSION: SHX1161

## 2016-04-17 LAB — CBC
HEMATOCRIT: 37.5 % (ref 36.0–46.0)
Hemoglobin: 12.6 g/dL (ref 12.0–15.0)
MCH: 29 pg (ref 26.0–34.0)
MCHC: 33.6 g/dL (ref 30.0–36.0)
MCV: 86.2 fL (ref 78.0–100.0)
PLATELETS: 261 10*3/uL (ref 150–400)
RBC: 4.35 MIL/uL (ref 3.87–5.11)
RDW: 18 % — AB (ref 11.5–15.5)
WBC: 7.7 10*3/uL (ref 4.0–10.5)

## 2016-04-17 LAB — CREATININE, SERUM: CREATININE: 0.82 mg/dL (ref 0.44–1.00)

## 2016-04-17 LAB — GLUCOSE, CAPILLARY
Glucose-Capillary: 129 mg/dL — ABNORMAL HIGH (ref 65–99)
Glucose-Capillary: 177 mg/dL — ABNORMAL HIGH (ref 65–99)

## 2016-04-17 SURGERY — ANTERIOR CERVICAL DECOMPRESSION/DISCECTOMY FUSION 1 LEVEL
Anesthesia: General | Site: Spine Cervical

## 2016-04-17 MED ORDER — CEFAZOLIN SODIUM-DEXTROSE 2-4 GM/100ML-% IV SOLN
INTRAVENOUS | Status: AC
  Filled 2016-04-17: qty 100

## 2016-04-17 MED ORDER — CHLORHEXIDINE GLUCONATE CLOTH 2 % EX PADS: 6.0000 | MEDICATED_PAD | Freq: Once | CUTANEOUS | Status: DC

## 2016-04-17 MED ORDER — PHENYLEPHRINE 40 MCG/ML (10ML) SYRINGE FOR IV PUSH (FOR BLOOD PRESSURE SUPPORT)
PREFILLED_SYRINGE | INTRAVENOUS | Status: AC
  Filled 2016-04-17: qty 10

## 2016-04-17 MED ORDER — MAGNESIUM CITRATE PO SOLN: 1.0000 | Freq: Once | ORAL | Status: DC | PRN

## 2016-04-17 MED ORDER — ONDANSETRON HCL 4 MG/2ML IJ SOLN: 4.0000 mg | Freq: Once | INTRAMUSCULAR | Status: DC | PRN

## 2016-04-17 MED ORDER — MIDAZOLAM HCL 2 MG/2ML IJ SOLN
INTRAMUSCULAR | Status: AC
  Filled 2016-04-17: qty 2

## 2016-04-17 MED ORDER — FENTANYL CITRATE (PF) 100 MCG/2ML IJ SOLN
INTRAMUSCULAR | Status: AC
  Filled 2016-04-17: qty 2

## 2016-04-17 MED ORDER — LOSARTAN POTASSIUM 50 MG PO TABS
100.0000 mg | ORAL_TABLET | Freq: Every day | ORAL | Status: DC
  Administered 2016-04-17 – 2016-04-19 (×3): 100 mg via ORAL
  Filled 2016-04-17 (×3): qty 2

## 2016-04-17 MED ORDER — DEXAMETHASONE SODIUM PHOSPHATE 10 MG/ML IJ SOLN
INTRAMUSCULAR | Status: AC
  Filled 2016-04-17: qty 1

## 2016-04-17 MED ORDER — BACITRACIN ZINC 500 UNIT/GM EX OINT
TOPICAL_OINTMENT | CUTANEOUS | Status: AC
  Filled 2016-04-17: qty 28.35

## 2016-04-17 MED ORDER — ONDANSETRON HCL 4 MG/2ML IJ SOLN
INTRAMUSCULAR | Status: DC | PRN
Start: 2016-04-17 — End: 2016-04-17
  Administered 2016-04-17: 4 mg via INTRAVENOUS

## 2016-04-17 MED ORDER — EPHEDRINE 5 MG/ML INJ
INTRAVENOUS | Status: AC
  Filled 2016-04-17: qty 10

## 2016-04-17 MED ORDER — EPHEDRINE SULFATE 50 MG/ML IJ SOLN
INTRAMUSCULAR | Status: DC | PRN
  Administered 2016-04-17 (×2): 5 mg via INTRAVENOUS

## 2016-04-17 MED ORDER — AMLODIPINE BESYLATE 5 MG PO TABS
5.0000 mg | ORAL_TABLET | Freq: Every day | ORAL | Status: DC
  Administered 2016-04-17 – 2016-04-19 (×3): 5 mg via ORAL
  Filled 2016-04-17 (×3): qty 1

## 2016-04-17 MED ORDER — THROMBIN 20000 UNITS EX SOLR
CUTANEOUS | Status: DC | PRN
  Administered 2016-04-17: 15:00:00 via TOPICAL

## 2016-04-17 MED ORDER — OXYCODONE HCL 5 MG PO TABS: 5.0000 mg | ORAL_TABLET | Freq: Once | ORAL | Status: DC | PRN

## 2016-04-17 MED ORDER — PROPOFOL 10 MG/ML IV BOLUS
INTRAVENOUS | Status: DC | PRN
  Administered 2016-04-17: 140 mg via INTRAVENOUS

## 2016-04-17 MED ORDER — FENTANYL CITRATE (PF) 100 MCG/2ML IJ SOLN
INTRAMUSCULAR | Status: DC | PRN
  Administered 2016-04-17 (×2): 50 ug via INTRAVENOUS
  Administered 2016-04-17: 100 ug via INTRAVENOUS
  Administered 2016-04-17 (×2): 50 ug via INTRAVENOUS

## 2016-04-17 MED ORDER — FERROUS SULFATE 325 (65 FE) MG PO TABS
325.0000 mg | ORAL_TABLET | Freq: Every day | ORAL | Status: DC
  Administered 2016-04-17 – 2016-04-19 (×3): 325 mg via ORAL
  Filled 2016-04-17 (×3): qty 1

## 2016-04-17 MED ORDER — BUPIVACAINE HCL (PF) 0.5 % IJ SOLN
INTRAMUSCULAR | Status: DC | PRN
  Administered 2016-04-17: 10 mL

## 2016-04-17 MED ORDER — SUGAMMADEX SODIUM 200 MG/2ML IV SOLN
INTRAVENOUS | Status: DC | PRN
Start: 2016-04-17 — End: 2016-04-17
  Administered 2016-04-17: 161.4 mg via INTRAVENOUS

## 2016-04-17 MED ORDER — ROCURONIUM BROMIDE 10 MG/ML (PF) SYRINGE
PREFILLED_SYRINGE | INTRAVENOUS | Status: AC
  Filled 2016-04-17: qty 10

## 2016-04-17 MED ORDER — SODIUM CHLORIDE 0.9% FLUSH: 3.0000 mL | INTRAVENOUS | Status: DC | PRN

## 2016-04-17 MED ORDER — ONDANSETRON HCL 4 MG/2ML IJ SOLN
INTRAMUSCULAR | Status: AC
  Filled 2016-04-17: qty 2

## 2016-04-17 MED ORDER — LIDOCAINE HCL (PF) 0.5 % IJ SOLN
INTRAMUSCULAR | Status: AC
  Filled 2016-04-17: qty 100

## 2016-04-17 MED ORDER — THROMBIN 20000 UNITS EX SOLR
CUTANEOUS | Status: AC
  Filled 2016-04-17: qty 20000

## 2016-04-17 MED ORDER — SODIUM CHLORIDE 0.9 % IV SOLN: 250.0000 mL | INTRAVENOUS | Status: DC

## 2016-04-17 MED ORDER — PHENYLEPHRINE HCL 10 MG/ML IJ SOLN
INTRAVENOUS | Status: DC | PRN
  Administered 2016-04-17: 25 ug/min via INTRAVENOUS

## 2016-04-17 MED ORDER — LIDOCAINE 2% (20 MG/ML) 5 ML SYRINGE
INTRAMUSCULAR | Status: AC
  Filled 2016-04-17: qty 5

## 2016-04-17 MED ORDER — ONDANSETRON HCL 4 MG/2ML IJ SOLN
4.0000 mg | INTRAMUSCULAR | Status: DC | PRN
  Administered 2016-04-19: 4 mg via INTRAVENOUS
  Filled 2016-04-17 (×2): qty 2

## 2016-04-17 MED ORDER — TETRAHYDROZOLINE HCL 0.05 % OP SOLN
1.0000 [drp] | Freq: Every day | OPHTHALMIC | Status: DC | PRN
  Filled 2016-04-17: qty 15

## 2016-04-17 MED ORDER — OXYCODONE HCL 5 MG PO TABS
5.0000 mg | ORAL_TABLET | ORAL | Status: DC | PRN
  Administered 2016-04-17: 5 mg via ORAL
  Administered 2016-04-18 (×5): 10 mg via ORAL
  Administered 2016-04-18: 5 mg via ORAL
  Administered 2016-04-19 (×2): 10 mg via ORAL
  Administered 2016-04-19: 5 mg via ORAL
  Administered 2016-04-20 (×3): 10 mg via ORAL
  Filled 2016-04-17 (×3): qty 2
  Filled 2016-04-17: qty 1
  Filled 2016-04-17 (×3): qty 2
  Filled 2016-04-17: qty 1
  Filled 2016-04-17 (×6): qty 2
  Filled 2016-04-17: qty 1

## 2016-04-17 MED ORDER — DOCUSATE SODIUM 100 MG PO CAPS: 100.0000 mg | ORAL_CAPSULE | Freq: Two times a day (BID) | ORAL | Status: DC | PRN

## 2016-04-17 MED ORDER — EPINEPHRINE PF 1 MG/ML IJ SOLN
INTRAMUSCULAR | Status: AC
  Filled 2016-04-17: qty 2

## 2016-04-17 MED ORDER — HEPARIN SODIUM (PORCINE) 5000 UNIT/ML IJ SOLN
5000.0000 [IU] | Freq: Three times a day (TID) | INTRAMUSCULAR | Status: DC
  Administered 2016-04-19 – 2016-04-20 (×5): 5000 [IU] via SUBCUTANEOUS
  Filled 2016-04-17 (×5): qty 1

## 2016-04-17 MED ORDER — SUGAMMADEX SODIUM 200 MG/2ML IV SOLN
INTRAVENOUS | Status: AC
  Filled 2016-04-17: qty 2

## 2016-04-17 MED ORDER — SODIUM CHLORIDE 0.9% FLUSH
3.0000 mL | Freq: Two times a day (BID) | INTRAVENOUS | Status: DC
  Administered 2016-04-18 – 2016-04-20 (×4): 3 mL via INTRAVENOUS

## 2016-04-17 MED ORDER — LIDOCAINE-EPINEPHRINE 1 %-1:100000 IJ SOLN
INTRAMUSCULAR | Status: DC | PRN
  Administered 2016-04-17: 3 mL via INTRADERMAL

## 2016-04-17 MED ORDER — MENTHOL 3 MG MT LOZG: 1.0000 | LOZENGE | OROMUCOSAL | Status: DC | PRN

## 2016-04-17 MED ORDER — PHENOL 1.4 % MT LIQD
1.0000 | OROMUCOSAL | Status: DC | PRN
  Administered 2016-04-17: 1 via OROMUCOSAL
  Filled 2016-04-17: qty 177

## 2016-04-17 MED ORDER — PROPOFOL 10 MG/ML IV BOLUS
INTRAVENOUS | Status: AC
  Filled 2016-04-17: qty 20

## 2016-04-17 MED ORDER — DEXAMETHASONE SODIUM PHOSPHATE 10 MG/ML IJ SOLN
INTRAMUSCULAR | Status: DC | PRN
  Administered 2016-04-17: 10 mg via INTRAVENOUS

## 2016-04-17 MED ORDER — FENTANYL CITRATE (PF) 100 MCG/2ML IJ SOLN
INTRAMUSCULAR | Status: AC
  Filled 2016-04-17: qty 4

## 2016-04-17 MED ORDER — THROMBIN 5000 UNITS EX SOLR
CUTANEOUS | Status: AC
  Filled 2016-04-17: qty 10000

## 2016-04-17 MED ORDER — LIDOCAINE-EPINEPHRINE (PF) 2 %-1:200000 IJ SOLN
INTRAMUSCULAR | Status: DC | PRN
  Administered 2016-04-17: 7 mL

## 2016-04-17 MED ORDER — LACTATED RINGERS IV SOLN
INTRAVENOUS | Status: DC
  Administered 2016-04-17 (×3): via INTRAVENOUS

## 2016-04-17 MED ORDER — 0.9 % SODIUM CHLORIDE (POUR BTL) OPTIME
TOPICAL | Status: DC | PRN
  Administered 2016-04-17 (×2): 1000 mL

## 2016-04-17 MED ORDER — FENTANYL CITRATE (PF) 100 MCG/2ML IJ SOLN: 25.0000 ug | INTRAMUSCULAR | Status: DC | PRN

## 2016-04-17 MED ORDER — LACTATED RINGERS IV SOLN
INTRAVENOUS | Status: DC | PRN
  Administered 2016-04-17: 14:00:00 via INTRAVENOUS

## 2016-04-17 MED ORDER — MIDAZOLAM HCL 5 MG/5ML IJ SOLN
INTRAMUSCULAR | Status: DC | PRN
  Administered 2016-04-17: 2 mg via INTRAVENOUS

## 2016-04-17 MED ORDER — OXYCODONE HCL 5 MG/5ML PO SOLN: 5.0000 mg | Freq: Once | ORAL | Status: DC | PRN

## 2016-04-17 MED ORDER — POTASSIUM CHLORIDE IN NACL 20-0.9 MEQ/L-% IV SOLN
INTRAVENOUS | Status: DC
  Administered 2016-04-17: 22:00:00 via INTRAVENOUS
  Filled 2016-04-17 (×2): qty 1000

## 2016-04-17 MED ORDER — SIMETHICONE 80 MG PO CHEW: 80.0000 mg | CHEWABLE_TABLET | Freq: Every day | ORAL | Status: DC | PRN

## 2016-04-17 MED ORDER — CEFAZOLIN SODIUM-DEXTROSE 2-4 GM/100ML-% IV SOLN
2.0000 g | INTRAVENOUS | Status: AC
  Administered 2016-04-17 (×2): 2 g via INTRAVENOUS

## 2016-04-17 MED ORDER — ROCURONIUM BROMIDE 100 MG/10ML IV SOLN
INTRAVENOUS | Status: DC | PRN
  Administered 2016-04-17: 30 mg via INTRAVENOUS
  Administered 2016-04-17: 50 mg via INTRAVENOUS
  Administered 2016-04-17: 15 mg via INTRAVENOUS
  Administered 2016-04-17: 30 mg via INTRAVENOUS

## 2016-04-17 MED ORDER — BUPIVACAINE HCL (PF) 0.5 % IJ SOLN
INTRAMUSCULAR | Status: AC
  Filled 2016-04-17: qty 30

## 2016-04-17 MED ORDER — LIDOCAINE HCL (CARDIAC) 20 MG/ML IV SOLN
INTRAVENOUS | Status: DC | PRN
  Administered 2016-04-17: 50 mg via INTRAVENOUS

## 2016-04-17 SURGICAL SUPPLY — 96 items
BIT DRILL 14X3 FLUT 2XNS (BIT) ×1 IMPLANT
BIT DRILL 2.4X (BIT) ×1 IMPLANT
BIT DRILL NEURO 2X3.1 SFT TUCH (MISCELLANEOUS) ×1 IMPLANT
BIT DRL 14X3 FLUT 2XNS (BIT) ×1
BIT DRL 2.4X (BIT) ×1
BLADE CLIPPER SURG (BLADE) IMPLANT
BNDG GAUZE ELAST 4 BULKY (GAUZE/BANDAGES/DRESSINGS) IMPLANT
BONE VIVIGEN FORMABLE 5.4CC (Bone Implant) ×3 IMPLANT
BUR DRUM 4.0 (BURR) ×2 IMPLANT
BUR DRUM 4.0MM (BURR) ×1
CABLE SNG STERILE W/CRIMP (MISCELLANEOUS) ×2 IMPLANT
CANISTER SUCT 3000ML PPV (MISCELLANEOUS) ×6 IMPLANT
CARTRIDGE OIL MAESTRO DRILL (MISCELLANEOUS) ×1 IMPLANT
DECANTER SPIKE VIAL GLASS SM (MISCELLANEOUS) IMPLANT
DERMABOND ADVANCED (GAUZE/BANDAGES/DRESSINGS) ×2
DERMABOND ADVANCED .7 DNX12 (GAUZE/BANDAGES/DRESSINGS) ×1 IMPLANT
DIFFUSER DRILL AIR PNEUMATIC (MISCELLANEOUS) ×3 IMPLANT
DRAPE C-ARM 42X72 X-RAY (DRAPES) ×9 IMPLANT
DRAPE HALF SHEET 40X57 (DRAPES) IMPLANT
DRAPE LAPAROTOMY 100X72 PEDS (DRAPES) ×6 IMPLANT
DRAPE MICROSCOPE LEICA (MISCELLANEOUS) ×3 IMPLANT
DRAPE POUCH INSTRU U-SHP 10X18 (DRAPES) ×6 IMPLANT
DRILL BIT (BIT) ×2
DRILL BIT 14MM (BIT) ×2
DRILL NEURO 2X3.1 SOFT TOUCH (MISCELLANEOUS) ×3
DRSG OPSITE POSTOP 4X6 (GAUZE/BANDAGES/DRESSINGS) ×3 IMPLANT
DURAPREP 26ML APPLICATOR (WOUND CARE) ×3 IMPLANT
DURAPREP 6ML APPLICATOR 50/CS (WOUND CARE) ×3 IMPLANT
ELECT COATED BLADE 2.86 ST (ELECTRODE) ×3 IMPLANT
ELECT REM PT RETURN 9FT ADLT (ELECTROSURGICAL) ×6
ELECTRODE REM PT RTRN 9FT ADLT (ELECTROSURGICAL) ×2 IMPLANT
GAUZE SPONGE 4X4 16PLY XRAY LF (GAUZE/BANDAGES/DRESSINGS) IMPLANT
GLOVE BIO SURGEON STRL SZ 6.5 (GLOVE) ×4 IMPLANT
GLOVE BIO SURGEON STRL SZ7 (GLOVE) ×6 IMPLANT
GLOVE BIO SURGEON STRL SZ7.5 (GLOVE) IMPLANT
GLOVE BIO SURGEON STRL SZ8 (GLOVE) IMPLANT
GLOVE BIO SURGEON STRL SZ8.5 (GLOVE) IMPLANT
GLOVE BIO SURGEONS STRL SZ 6.5 (GLOVE) ×2
GLOVE BIOGEL M 8.0 STRL (GLOVE) IMPLANT
GLOVE BIOGEL PI IND STRL 6.5 (GLOVE) ×2 IMPLANT
GLOVE BIOGEL PI IND STRL 7.5 (GLOVE) ×5 IMPLANT
GLOVE BIOGEL PI INDICATOR 6.5 (GLOVE) ×4
GLOVE BIOGEL PI INDICATOR 7.5 (GLOVE) ×10
GLOVE ECLIPSE 6.5 STRL STRAW (GLOVE) ×9 IMPLANT
GLOVE ECLIPSE 7.0 STRL STRAW (GLOVE) IMPLANT
GLOVE ECLIPSE 7.5 STRL STRAW (GLOVE) IMPLANT
GLOVE ECLIPSE 8.0 STRL XLNG CF (GLOVE) IMPLANT
GLOVE ECLIPSE 8.5 STRL (GLOVE) IMPLANT
GLOVE EXAM NITRILE LRG STRL (GLOVE) IMPLANT
GLOVE EXAM NITRILE XL STR (GLOVE) IMPLANT
GLOVE EXAM NITRILE XS STR PU (GLOVE) IMPLANT
GLOVE INDICATOR 6.5 STRL GRN (GLOVE) IMPLANT
GLOVE INDICATOR 7.0 STRL GRN (GLOVE) IMPLANT
GLOVE INDICATOR 7.5 STRL GRN (GLOVE) IMPLANT
GLOVE INDICATOR 8.0 STRL GRN (GLOVE) IMPLANT
GLOVE INDICATOR 8.5 STRL (GLOVE) IMPLANT
GLOVE OPTIFIT SS 8.0 STRL (GLOVE) IMPLANT
GLOVE SS BIOGEL STRL SZ 7.5 (GLOVE) ×1 IMPLANT
GLOVE SUPERSENSE BIOGEL SZ 7.5 (GLOVE) ×2
GLOVE SURG SS PI 6.5 STRL IVOR (GLOVE) IMPLANT
GOWN STRL REUS W/ TWL LRG LVL3 (GOWN DISPOSABLE) ×7 IMPLANT
GOWN STRL REUS W/ TWL XL LVL3 (GOWN DISPOSABLE) IMPLANT
GOWN STRL REUS W/TWL 2XL LVL3 (GOWN DISPOSABLE) IMPLANT
GOWN STRL REUS W/TWL LRG LVL3 (GOWN DISPOSABLE) ×14
GOWN STRL REUS W/TWL XL LVL3 (GOWN DISPOSABLE)
IMPL ZERO-P 9MM LRD (Orthopedic Implant) ×1 IMPLANT
IMPLANT ZERO-P 9MM LRD (Orthopedic Implant) ×3 IMPLANT
KIT BASIN OR (CUSTOM PROCEDURE TRAY) ×3 IMPLANT
KIT ROOM TURNOVER OR (KITS) ×3 IMPLANT
NEEDLE HYPO 25X1 1.5 SAFETY (NEEDLE) ×3 IMPLANT
NEEDLE SPNL 22GX3.5 QUINCKE BK (NEEDLE) ×3 IMPLANT
NS IRRIG 1000ML POUR BTL (IV SOLUTION) ×6 IMPLANT
OIL CARTRIDGE MAESTRO DRILL (MISCELLANEOUS) ×3
PACK LAMINECTOMY NEURO (CUSTOM PROCEDURE TRAY) ×6 IMPLANT
PAD ARMBOARD 7.5X6 YLW CONV (MISCELLANEOUS) ×21 IMPLANT
PIN DISTRACTION 14MM (PIN) ×6 IMPLANT
ROD PRECUT BULLET 3.5X25 (Rod) ×6 IMPLANT
RUBBERBAND STERILE (MISCELLANEOUS) ×6 IMPLANT
SCREW 14MM TI CERV LOCK (Screw) ×12 IMPLANT
SCREW 3.5X24 (Screw) ×6 IMPLANT
SCREW SET M6 (Screw) ×15 IMPLANT
SCREW VERTEX 3.5X18 MAX (Screw) IMPLANT
SPONGE INTESTINAL PEANUT (DISPOSABLE) ×3 IMPLANT
SPONGE SURGIFOAM ABS GEL SZ50 (HEMOSTASIS) ×6 IMPLANT
SUT VIC AB 0 CT1 18XCR BRD8 (SUTURE) ×2 IMPLANT
SUT VIC AB 0 CT1 27 (SUTURE) ×2
SUT VIC AB 0 CT1 27XBRD ANTBC (SUTURE) ×1 IMPLANT
SUT VIC AB 0 CT1 8-18 (SUTURE) ×4
SUT VIC AB 2-0 CT1 18 (SUTURE) ×3 IMPLANT
SUT VIC AB 3-0 SH 8-18 (SUTURE) ×9 IMPLANT
Single cable with integral crimp Atlas ×3 IMPLANT
TOWEL OR 17X24 6PK STRL BLUE (TOWEL DISPOSABLE) ×6 IMPLANT
TOWEL OR 17X26 10 PK STRL BLUE (TOWEL DISPOSABLE) ×6 IMPLANT
TUBE CONNECTING 12'X1/4 (SUCTIONS) ×1
TUBE CONNECTING 12X1/4 (SUCTIONS) ×2 IMPLANT
WATER STERILE IRR 1000ML POUR (IV SOLUTION) ×3 IMPLANT

## 2016-04-17 NOTE — Anesthesia Preprocedure Evaluation (Addendum)
Anesthesia Evaluation  Patient identified by MRN, date of birth, ID band Patient awake    Reviewed: Allergy & Precautions, NPO status , Patient's Chart, lab work & pertinent test results  Airway Mallampati: I       Dental  (+) Edentulous Upper, Missing, Dental Advisory Given   Pulmonary former smoker,    breath sounds clear to auscultation       Cardiovascular hypertension, Pt. on medications  Rhythm:Regular Rate:Normal     Neuro/Psych  Headaches, PSYCHIATRIC DISORDERS Anxiety  Neuromuscular disease    GI/Hepatic negative GI ROS, Neg liver ROS,   Endo/Other  diabetes, Well Controlled, Type 2  Renal/GU negative Renal ROS  negative genitourinary   Musculoskeletal  (+) Arthritis , Osteoarthritis,    Abdominal   Peds negative pediatric ROS (+)  Hematology negative hematology ROS (+)   Anesthesia Other Findings - HLD  Reproductive/Obstetrics negative OB ROS                            Lab Results  Component Value Date   WBC 6.6 04/14/2016   HGB 13.8 04/14/2016   HCT 40.8 04/14/2016   MCV 86.4 04/14/2016   PLT 262 04/14/2016   Lab Results  Component Value Date   CREATININE 0.84 04/14/2016   BUN 10 04/14/2016   NA 139 04/14/2016   K 3.3 (L) 04/14/2016   CL 106 04/14/2016   CO2 25 04/14/2016   Lab Results  Component Value Date   INR 1.06 01/16/2015   EKG: normal sinus rhythm.  Anesthesia Physical Anesthesia Plan  ASA: II  Anesthesia Plan: General   Post-op Pain Management:    Induction: Intravenous  Airway Management Planned: Oral ETT and Video Laryngoscope Planned  Additional Equipment:   Intra-op Plan:   Post-operative Plan: Extubation in OR  Informed Consent: I have reviewed the patients History and Physical, chart, labs and discussed the procedure including the risks, benefits and alternatives for the proposed anesthesia with the patient or authorized  representative who has indicated his/her understanding and acceptance.   Dental advisory given  Plan Discussed with: CRNA  Anesthesia Plan Comments:        Anesthesia Quick Evaluation

## 2016-04-17 NOTE — Progress Notes (Signed)
Arrived from PACU. Alert and oriented. Lower back pain 10/10. Posterior dressing CDI.  Anterior skin liquid CDI. Moaning. Family at bedside.

## 2016-04-17 NOTE — Op Note (Signed)
04/17/2016  7:52 PM  PATIENT:  Orvan July  70 y.o. female with spinal cord compression at C3/4, and left nerve root compression at C6/7,7/T1 on the left  PRE-OPERATIVE DIAGNOSIS:  Cervical hnp with myelopathy C3/4, left C7, t1  POST-OPERATIVE DIAGNOSIS:  Cervical hnp with myelopathy C3/4, left C7, t1  PROCEDURE:  Anterior Cervical decompression C3/4 Arthrodesis C3/4 with 83mm peek plif cage(Synthes Zero p) Anterior instrumentation(Synthes zero p) C3/4, allograft morsels Posterior foraminotomy C6/7, 7/T1 left Spinous process wiring C6-T1, posterior arthrodesis c6-T1  SURGEON:   Surgeon(s): Ashok Pall, MD Kevan Ny Ditty, MD   ASSISTANTS:Ditty, Marland Kitchen  ANESTHESIA:   general  EBL:  Total I/O In: 400 [I.V.:400] Out: -   BLOOD ADMINISTERED:none  CELL SAVER GIVEN:none  COUNT:per nursing  DRAINS: none   SPECIMEN:  No Specimen  DICTATION: Mrs. Holme was taken to the operating room, intubated, and placed under general anesthesia without difficulty. She was positioned supine with her head in slight extension on a horseshoe headrest. The neck was prepped and draped in a sterile manner. I infiltrated 4 cc's 1/2%lidocaine/1:200,000 strength epinephrine into the planned incision starting from the midline to the medial border of the left sternocleidomastoid muscle. I opened the incision with a 10 blade and dissected sharply through soft tissue to the platysma. I dissected in the plane superior to the platysma both rostrally and caudally. I then opened the platysma in a horizontal fashion with Metzenbaum scissors, and dissected in the inferior plane rostrally and caudally. With both blunt and sharp technique I created an avascular corridor to the cervical spine. I placed a spinal needle(s) in the disc space at 3/4 . I then reflected the longus colli from C3 to C4 and placed self retaining retractors. I opened the disc space(s) at 3/4 with a 15 blade. I removed disc with curettes,  Kerrison punches, and the drill. Using the drill I removed osteophytes and prepared for the decompression.  I decompressed the spinal canal and the C4 root(s) with the drill, Kerrison punches, and the curettes. I used the microscope to aid in microdissection. I removed the posterior longitudinal ligament to fully expose and decompress the thecal sac. I exposed the roots laterally taking down the 3/4 uncovertebral joints. With the decompression complete I moved on to the arthrodesis. I used the drill to level the surfaces of C3, and C4. I removed soft tissue to prepare the disc space and the bony surfaces. I measured the space and placed an 59mm Zero p peek plif cage into the disc space with allograft morsels.  I then placed the anterior screws into the integrated plate attached to the plif cage. I placed 2 screws in each vertebral body through the plate. I locked the screws into place. Intraoperative xray showed the graft, plate, and screws to be in good position. I irrigated the wound, achieved hemostasis, and closed the wound in layers. I approximated the platysma, and the subcuticular plane with vicryl sutures. I used Dermabond for a sterile dressing. \  We then repositioned the patient prone on a jackson table using flat rolls, for support. The neck was draped and prepped in a sterile manner. I infiltrated lidocaine into planned incision. I opened the skin with a 10 blade and dissected through the deep cervical fascia exposing the lamina of C6,7, and T1. I performed foraminotomies at 6/7, and C7/T1 on the left. I placed pedicle screws on the left at C7, and T1. I was able to decompress both C7, T1 roots taking  some of the lamina and the facet joint at each level. I placed a spinous process songer cable from C6-T1 given report of possible psuedoarthrosis at this level.  I placed allograft morsels along the lamina of C6,7and T1 to complete the arthrodesis. I irrigated the wound and closed in layers using  vicryl sutures. I used dermabond and an occlusive bandage for a sterile dressing.   PLAN OF CARE: Admit to inpatient   PATIENT DISPOSITION:  PACU - hemodynamically stable.   Delay start of Pharmacological VTE agent (>24hrs) due to surgical blood loss or risk of bleeding:  yes

## 2016-04-17 NOTE — Anesthesia Procedure Notes (Deleted)
Anesthesia Regional Block:    Pre-Anesthetic Checklist: ,, timeout performed, Correct Patient, Correct Site, Correct Laterality, Correct Procedure, Correct Position, site marked, Risks and benefits discussed,  Surgical consent,  Pre-op evaluation,  At surgeon's request and post-op pain management   Prep: chloraprep       Needles:  Injection technique: Single-shot  Needle Type: Echogenic Needle     Needle Length: 9cm 9 cm Needle Gauge: 21 and 21 G    Additional Needles:  Procedures: ultrasound guided (picture in chart)  Narrative:  Injection made incrementally with aspirations every 5 mL.  Performed by: Personally  Anesthesiologist: Effie Berkshire

## 2016-04-17 NOTE — Anesthesia Procedure Notes (Signed)
Procedure Name: Intubation Date/Time: 04/17/2016 2:03 PM Performed by: Rejeana Brock L Pre-anesthesia Checklist: Patient identified, Emergency Drugs available, Suction available and Patient being monitored Patient Re-evaluated:Patient Re-evaluated prior to inductionOxygen Delivery Method: Circle System Utilized Preoxygenation: Pre-oxygenation with 100% oxygen Intubation Type: IV induction Ventilation: Mask ventilation without difficulty Laryngoscope Size: Glidescope and 3 Grade View: Grade I Tube type: Oral Tube size: 7.0 mm Number of attempts: 1 Airway Equipment and Method: Stylet,  Oral airway and Rigid stylet Placement Confirmation: ETT inserted through vocal cords under direct vision,  positive ETCO2 and breath sounds checked- equal and bilateral Secured at: 22 cm Tube secured with: Tape Dental Injury: Teeth and Oropharynx as per pre-operative assessment

## 2016-04-17 NOTE — H&P (Signed)
BP 132/78   Pulse (!) 54   Temp 98 F (36.7 C) (Oral)   Resp 20   Ht 5' 9.5" (1.765 m)   Wt 80.7 kg (178 lb)   SpO2 95%   BMI 25.91 kg/m  Angela Pugh is a 70 year old woman who presents today for evaluation of severe pain that she has in upper extremities, left being much worse than right, which extends from the neck and into the left hand. Her original operation by Dr. Carloyn Manner was in June 2000 and she says he was taking care of bone spurs. She had a lumbar spine operation in 2013 by Dr. Louanne Skye. She continues to work. She was recently discharged from the hospital for diverticulitis this past weekend and she received antibiotics and IV fluids.  Her chief complaint is numbness in both arms and fingers and she said this started six months ago. She has no known etiology for the problem. She has pain in her arm, neck and legs along with back. Nothing relieves the pain. She says the pain is now 10/10. She says that the pain is more intense and she can barely use her left hand. She feels weakness in her hands and upper extremities, numbness and tingling in her hands and upper extremities. She says that she has had some weight loss secondary to the diverticulitis. REVIEW OF SYSTEMS: Review of systems positive for night sweats, balance problems, shortness of breath, leg pain, nausea, abdominal pain, arthritis, neck pain, arm weakness, leg weakness, back pain, arm pain, leg pain, blurred vision, excessive thirst.  PAST MEDICAL HISTORY: She is diabetic herself. She also has a history of hypertension.  Medications and Allergies: SHE SAYS SHE HAS AN ALLERGY TO TYLENOL. She is currently taking Losartan, Amlodipine, a stool softener, Atorvastatin, and Hydrocodone. FAMILY HISTORY: Mother and father are both deceased. Uterine cancer and unknown reason for her father. Hypertension and diabetes present in the family history.  NEUROLOGICAL EXAMINATION: On exam she is alert, oriented x 4, and answering all questions  appropriately. Memory, language, attention span and fund of knowledge are normal. Speech is clear, it is fluent. She is weak in her grips bilaterally. Right and left were the same. She has very limited movement of the proximal phalanges and distal phalanges. She says it hurts too much for her to move them. She is hyperreflexic in both the upper and lower extremities, biceps, triceps, brachioradiales, knees, and ankles. She does have Hoffman sign bilaterally, but no clonus was appreciated. Romberg was negative. She did not perform tandem gain without falling to the side. She, on exam, had decreased sensation in the left fifth trigeminal distribution of the first, second, and third divisions of the nerve. Speech is clear and fluent. Tongue and uvula in the midline. Pupils equal, round and reactive to light. Full extraocular movements. She is 5 feet 7 inches , she weighs 175 pounds. Temperature is 97.8, blood pressure is 153/69, pulse is 67, pain is 8/10. IMPRESSION/PLAN: I did look for any kind of study that might have had her neck in it. She has had a long plate construction, but this was a CT of the head done many years ago. She needs a new plain x-ray of the cervical spine and an MRI of the cervical spine. I am worried about spinal cord compression. She says she has been dropping objects again, which she was doing originally when she had the surgery in 2000. She has a great deal of pain. She does have mildly positive  Tinel sign. She did say she had EMG and nerve conduction studies performed. I just do not have that study and I asked that she please obtain the results. Angela Pugh on MRI has severe stenosis at C3-4, with cord signal present and she seems to have severe foraminal narrowing on the left side at C7-T1. She has a plate that goes from C4 to C7. I do not need to remove the plate to decompress her at C3-4, I can use an interbody device and will plan on such. At C7-T1, quite honestly I will go from behind  but because she is far more symptomatic on the left side and has significant pain in the left upper extremity versus the general numbness and tingling which she does have, and she is also weaker in the left intrinsics at 4/5, grip 4/5, opponens pollicis, and digiti minimi pollicis. I will do a C7-T1 decompression and then subsequent arthrodesis at that level posteriorly. We will do it at the same time, as this will allow her to at least get back to work as soon as she could, and the cervical spine at 3-4 has to get done first because that is where she has the cord compression. At 7-1, it is nerve root compression, the cord itself is fine.   IMPRESSION/PLAN: The risks and benefits she fully understands, having had the operation, and I explained paralysis, weakness in one or both upper extremities, weakness in one or both legs, bowel or bladder dysfunction, no improvement, worse pain, hardware failure, and fusion failure. She understands and knows that she will have a very stiff neck, now being fused from C3 through T1. She has a listhesis present at C7-T1, thus again I think she has to be fused at that level in order to maintain the stability in the future.

## 2016-04-17 NOTE — Transfer of Care (Signed)
Immediate Anesthesia Transfer of Care Note  Patient: Angela Pugh  Procedure(s) Performed: Procedure(s) with comments: ANTERIOR CERVICAL DECOMPRESSION/DISCECTOMY FUSION CERVICAL THREE - CERVICAL FOUR, POSTERIOR CERVICAL DISCECTOMY CERVICAL SEVEN -THORASIC ONE LEFT (N/A) - Anterior/Posterior  Patient Location: PACU  Anesthesia Type:General  Level of Consciousness: sedated  Airway & Oxygen Therapy: Patient Spontanous Breathing and Patient connected to nasal cannula oxygen  Post-op Assessment: Report given to RN and Post -op Vital signs reviewed and stable  Post vital signs: Reviewed and stable  Last Vitals:  Vitals:   04/17/16 0831  BP: 132/78  Pulse: (!) 54  Resp: 20  Temp: 36.7 C    Last Pain:  Vitals:   04/17/16 0831  TempSrc: Oral         Complications: No apparent anesthesia complications

## 2016-04-18 LAB — GLUCOSE, CAPILLARY: Glucose-Capillary: 153 mg/dL — ABNORMAL HIGH (ref 65–99)

## 2016-04-18 MED ORDER — MORPHINE SULFATE (PF) 2 MG/ML IV SOLN
2.0000 mg | Freq: Once | INTRAVENOUS | Status: AC
  Administered 2016-04-18: 2 mg via INTRAVENOUS

## 2016-04-18 MED ORDER — CARISOPRODOL 350 MG PO TABS
350.0000 mg | ORAL_TABLET | Freq: Four times a day (QID) | ORAL | Status: DC | PRN
  Administered 2016-04-18 – 2016-04-19 (×4): 350 mg via ORAL
  Filled 2016-04-18 (×5): qty 1

## 2016-04-18 MED ORDER — MORPHINE SULFATE (PF) 2 MG/ML IV SOLN
INTRAVENOUS | Status: AC
  Filled 2016-04-18: qty 1

## 2016-04-18 MED ORDER — MORPHINE SULFATE (PF) 2 MG/ML IV SOLN
2.0000 mg | INTRAVENOUS | Status: DC | PRN
  Administered 2016-04-18 – 2016-04-19 (×5): 2 mg via INTRAVENOUS
  Filled 2016-04-18 (×5): qty 1

## 2016-04-18 NOTE — Anesthesia Postprocedure Evaluation (Signed)
Anesthesia Post Note  Patient: Angela Pugh  Procedure(s) Performed: Procedure(s) (LRB): ANTERIOR CERVICAL DECOMPRESSION/DISCECTOMY FUSION CERVICAL THREE - CERVICAL FOUR, POSTERIOR CERVICAL DISCECTOMY CERVICAL SEVEN -THORASIC ONE LEFT (N/A)  Patient location during evaluation: PACU Anesthesia Type: General Level of consciousness: awake, awake and alert and oriented Pain management: pain level controlled Vital Signs Assessment: post-procedure vital signs reviewed and stable Respiratory status: spontaneous breathing, nonlabored ventilation and respiratory function stable Cardiovascular status: blood pressure returned to baseline Anesthetic complications: no    Last Vitals:  Vitals:   04/17/16 2039 04/17/16 2044  BP:  (!) 142/74  Pulse:  65  Resp:  19  Temp: 36.5 C 36.4 C    Last Pain:  Vitals:   04/17/16 2230  TempSrc:   PainSc: Asleep                 [name] COKER

## 2016-04-18 NOTE — Progress Notes (Signed)
OT Cancellation Note  Patient Details Name: Angela Pugh MRN: [id number] DOB: 1945-09-19   Cancelled Treatment:    Reason Eval/Treat Not Completed: Pain limiting ability to participate. Pt states she is in too much pain and would like to wait until the am. Will follow up tomorrow.   [place], OTR/L  [id number] 04/18/2016 04/18/2016, 4:04 PM

## 2016-04-18 NOTE — Evaluation (Signed)
Physical Therapy Evaluation Patient Details Name: Angela Pugh MRN: [id number] DOB: 12/18/1945 Today's Date: 04/18/2016   History of Present Illness  Pt is a 70 y/o female s/p C3-C4 anterior cervical decompression and posterior foraminotomy at C6-C7/T1. PMH including but not limited to DM and HTN.  Clinical Impression  Pt presented supine in bed with HOB elevated, awake and willing to participate in therapy session. Prior to admission, pt reported that she was independent with all functional mobility and ADLs. 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 Pt currently requiring min A for bed mobility and min A with HHA for ambulation. Pt would continue to benefit from skilled physical therapy services at this time while admitted to address her below listed limitations in order to improve her overall safety and independence with functional mobility.     Follow Up Recommendations Supervision for mobility/OOB    Equipment Recommendations  None recommended by PT;Other (comment) (pt has SPC and RW at home)    Recommendations for Other Services       Precautions / Restrictions Precautions Precautions: Cervical Required Braces or Orthoses: Cervical Brace Cervical Brace: Hard collar Restrictions Weight Bearing Restrictions: No      Mobility  Bed Mobility Overal bed mobility: Needs Assistance Bed Mobility: Rolling;Sidelying to Sit;Sit to Sidelying Rolling: Min guard Sidelying to sit: Min assist     Sit to sidelying: Min assist General bed mobility comments: pt required increased time, VC'ing for log roll technique and min A with trunk to achieve sitting EOB. Pt also required min A with bilateral LEs to return to supine  Transfers Overall transfer level: Needs assistance Equipment used: None Transfers: Sit to/from Stand Sit to Stand: Min guard         General transfer comment: pt required increased time, min guard for  safety  Ambulation/Gait Ambulation/Gait assistance: Min assist Ambulation Distance (Feet): 50 Feet Assistive device: 1 person hand held assist Gait Pattern/deviations: Step-through pattern;Decreased stride length;Drifts right/left;Narrow base of support Gait velocity: decreased Gait velocity interpretation: Below normal speed for age/gender General Gait Details: pt very cautious with ambulation with 1HHA; however, also reaching frequently for stable surfaces and hand rails in hallway.  Stairs            Wheelchair Mobility    Modified Rankin (Stroke Patients Only)       Balance Overall balance assessment: Needs assistance Sitting-balance support: Feet supported;No upper extremity supported Sitting balance-Leahy Scale: Fair     Standing balance support: During functional activity;No upper extremity supported Standing balance-Leahy Scale: Fair                               Pertinent Vitals/Pain Pain Assessment: 0-10 Pain Score: 10-Worst pain ever Pain Location: neck Pain Descriptors / Indicators: Sore;Grimacing;Guarding Pain Intervention(s): Monitored during session;Repositioned    Home Living Family/patient expects to be discharged to:: Private residence Living Arrangements: Alone Available Help at Discharge: Family;Available 24 hours/day Type of Home: House Home Access: Stairs to enter Entrance Stairs-Rails: None Entrance Stairs-Number of Steps: 2 Home Layout: One level Home Equipment: Walker - 2 wheels;Cane - single point      Prior Function Level of Independence: Independent         Comments: 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     Hand Dominance        Extremity/Trunk Assessment   Upper Extremity  Assessment: LUE deficits/detail       LUE Deficits / Details: pt reported numbness and tingling in L UE into hand and fingers.   Lower Extremity Assessment: Overall WFL for tasks assessed      Cervical / Trunk  Assessment: Other exceptions  Communication   Communication: No difficulties  Cognition Arousal/Alertness: Awake/alert Behavior During Therapy: WFL for tasks assessed/performed Overall Cognitive Status: Within Functional Limits for tasks assessed                      General Comments      Exercises     Assessment/Plan    PT Assessment Patient needs continued PT services  PT Problem List Decreased strength;Decreased activity tolerance;Decreased balance;Decreased mobility;Decreased coordination;Decreased safety awareness;Pain          PT Treatment Interventions DME instruction;Gait training;Stair training;Functional mobility training;Therapeutic activities;Therapeutic exercise;Balance training;Neuromuscular re-education;Patient/family education    PT Goals (Current goals can be found in the Care Plan section)  Acute Rehab PT Goals Patient Stated Goal: return home PT Goal Formulation: With patient Time For Goal Achievement: 05/02/16 Potential to Achieve Goals: Good    Frequency Min 5X/week   Barriers to discharge        Co-evaluation               End of Session Equipment Utilized During Treatment: Gait belt;Cervical collar Activity Tolerance: Patient limited by pain Patient left: in bed;with call bell/phone within reach;with bed alarm set;with SCD's reapplied Nurse Communication: Mobility status         Time: 1341-1414 PT Time Calculation (min) (ACUTE ONLY): 33 min   Charges:   PT Evaluation $PT Eval Low Complexity: 1 Procedure PT Treatments $Gait Training: 8-22 mins   PT G CodesClearnce Sorrel Cloud Graham 04/18/2016, 3:52 PM Sherie Don, [place], DPT [phone]

## 2016-04-18 NOTE — Progress Notes (Signed)
Patient states she tolerated breakfast, no nausea or vomiting, states she wants to increase diet. She has voided since foley catheter removal.

## 2016-04-18 NOTE — Progress Notes (Signed)
D/C foley with 1400 cc of clear yellow urine. Ambulated in room. Tolerated fair. Upper back painful. Medicated with pain med before ambulation.

## 2016-04-18 NOTE — Progress Notes (Signed)
Patient states pain is "10/10" anterior neck at incision site. PRN morphine given per order. Refused repositioning, lights lowered. RN educated patient to notify RN when pain increases so interventions can be done prior to 10/10 pain. Patient verbalized understanding. She is now laying in bed watching television. States she wants to rest.

## 2016-04-18 NOTE — Progress Notes (Signed)
Patient ID: Angela Pugh, female   DOB: 1945-11-12, 70 y.o.   MRN: [id number] Subjective: Patient reports significant posterior neck pain  Objective: Vital signs in last 24 hours: Temp:  [97 F (36.1 C)-98.4 F (36.9 C)] 98.4 F (36.9 C) (12/02 0533) Pulse Rate:  [54-78] 74 (12/02 0533) Resp:  [18-22] 18 (12/02 0533) BP: (132-155)/(69-78) 155/69 (12/02 0533) SpO2:  [92 %-100 %] 100 % (12/02 0533) Weight:  [80.7 kg (178 lb)] 80.7 kg (178 lb) (12/01 0831)  Intake/Output from previous day: 12/01 0701 - 12/02 0700 In: 2800 [I.V.:2800] Out: 2300 [Urine:2125; Blood:175] Intake/Output this shift: No intake/output data recorded.  Neurologic: Grossly normal  Lab Results: Lab Results  Component Value Date   WBC 7.7 04/17/2016   HGB 12.6 04/17/2016   HCT 37.5 04/17/2016   MCV 86.2 04/17/2016   PLT 261 04/17/2016   Lab Results  Component Value Date   INR 1.06 01/16/2015   BMET Lab Results  Component Value Date   NA 139 04/14/2016   K 3.3 (L) 04/14/2016   CL 106 04/14/2016   CO2 25 04/14/2016   GLUCOSE 118 (H) 04/14/2016   BUN 10 04/14/2016   CREATININE 0.82 04/17/2016   CALCIUM 9.9 04/14/2016    Studies/Results: Dg Cervical Spine 2-3 Views  Result Date: 04/17/2016 CLINICAL DATA:  C3-4 ACDF EXAM: CERVICAL SPINE - 2-3 VIEW COMPARISON:  03/05/2016 FINDINGS: Initial films shows a probe at the level of the hyoid. Second film shows ACDF at C3-4, above a previously performed C4 - C7 ACDF. IMPRESSION: C3-4 ACDF. Electronically Signed   By: Nelson Chimes M.D.   On: 04/17/2016 16:08   Dg C-arm 1-60 Min-no Report  Result Date: 04/17/2016 There is no report for this exam.   Assessment/Plan: Doing well except for significant posterior neck soreness   LOS: 1 day    Elmore Hyslop S 04/18/2016, 7:20 AM

## 2016-04-19 LAB — GLUCOSE, CAPILLARY
GLUCOSE-CAPILLARY: 132 mg/dL — AB (ref 65–99)
Glucose-Capillary: 286 mg/dL — ABNORMAL HIGH (ref 65–99)

## 2016-04-19 MED ORDER — GABAPENTIN 300 MG PO CAPS
300.0000 mg | ORAL_CAPSULE | Freq: Three times a day (TID) | ORAL | Status: DC
  Administered 2016-04-19 – 2016-04-20 (×5): 300 mg via ORAL
  Filled 2016-04-19 (×5): qty 1

## 2016-04-19 MED ORDER — INSULIN ASPART 100 UNIT/ML ~~LOC~~ SOLN
0.0000 [IU] | Freq: Three times a day (TID) | SUBCUTANEOUS | Status: DC
  Administered 2016-04-19: 8 [IU] via SUBCUTANEOUS
  Administered 2016-04-20: 3 [IU] via SUBCUTANEOUS
  Administered 2016-04-20: 5 [IU] via SUBCUTANEOUS

## 2016-04-19 MED ORDER — DEXAMETHASONE SODIUM PHOSPHATE 4 MG/ML IJ SOLN
2.0000 mg | Freq: Three times a day (TID) | INTRAMUSCULAR | Status: AC
  Administered 2016-04-19 – 2016-04-20 (×3): 2 mg via INTRAVENOUS
  Filled 2016-04-19 (×3): qty 1

## 2016-04-19 MED ORDER — DEXAMETHASONE SODIUM PHOSPHATE 10 MG/ML IJ SOLN
10.0000 mg | Freq: Once | INTRAMUSCULAR | Status: AC
  Administered 2016-04-19: 10 mg via INTRAVENOUS
  Filled 2016-04-19: qty 1

## 2016-04-19 NOTE — Progress Notes (Signed)
Pain and difficulty swallowing Moving all extremities well Bandages look good Start steroids Continue supportive care

## 2016-04-19 NOTE — Progress Notes (Signed)
Physical Therapy Treatment Patient Details Name: Angela Pugh MRN: [id number] DOB: 01-May-1946 Today's Date: 04/19/2016    History of Present Illness Pt is a 70 y/o female s/p C3-C4 anterior cervical decompression and posterior foraminotomy at C6-C7/T1. PMH including but not limited to DM and HTN.    PT Comments    Pt presents with improved pain control and confidence in mobility ability.  Discussed concerns about home and pt feels ready to d/c once medically cleared.  Will have family/friends to assist and has no other needs for PT at this time.  D/C PT in acute setting.   Follow Up Recommendations  Supervision for mobility/OOB     Equipment Recommendations  None recommended by PT;Other (comment)    Recommendations for Other Services       Precautions / Restrictions Precautions Precautions: Cervical Precaution Comments: pt ok to remove c-collar when in bed Required Braces or Orthoses: Cervical Brace Cervical Brace: Hard collar;Other (comment) Restrictions Weight Bearing Restrictions: No    Mobility  Bed Mobility Overal bed mobility: Modified Independent Bed Mobility: Sit to Supine;Supine to Sit     Supine to sit: Supervision Sit to supine: Supervision   General bed mobility comments: moving with incr ease  Transfers Overall transfer level: Needs assistance Equipment used: None Transfers: Sit to/from Stand Sit to Stand: Supervision         General transfer comment: S  Ambulation/Gait Ambulation/Gait assistance: Supervision Ambulation Distance (Feet): 25 Feet Assistive device: None Gait Pattern/deviations: Step-through pattern;Decreased stride length     General Gait Details: improved confidence with gait, did not want to walk far, just back to bed; feels much better today with less pain and confident in ability to move around   Stairs Stairs: Yes Stairs assistance: Supervision Stair Management: One rail Right;Forwards Number of Stairs: 1 General  stair comments: pt educated on up/down single step entryway, has not concerns about ability to d/c home  Wheelchair Mobility    Modified Rankin (Stroke Patients Only)       Balance Overall balance assessment: No apparent balance deficits (not formally assessed) Sitting-balance support: No upper extremity supported;Feet supported Sitting balance-Leahy Scale: Good       Standing balance-Leahy Scale: Good                      Cognition Arousal/Alertness: Awake/alert Behavior During Therapy: WFL for tasks assessed/performed Overall Cognitive Status: Within Functional Limits for tasks assessed                      Exercises Other Exercises Other Exercises: theraputty grip/pinch strengthening - yellow  handout given and reviewed Other Exercises: squeeze ball Other Exercises: gentle ROM R/L shoulder FF  to 90 AAROM    General Comments General comments (skin integrity, edema, etc.): incision on anterior neck dry and uncovered      Pertinent Vitals/Pain Pain Assessment: 0-10 Pain Score: 2  Pain Location: neck Pain Descriptors / Indicators: Discomfort;Burning    Home Living Family/patient expects to be discharged to:: Private residence Living Arrangements: Alone Available Help at Discharge: Family;Available 24 hours/day Type of Home: House Home Access: Stairs to enter Entrance Stairs-Rails: None Home Layout: One level Home Equipment: Environmental consultant - 2 wheels;Cane - single point;Bedside commode;Shower seat;Hand held shower head      Prior Function Level of Independence: Independent      Comments: 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   PT Goals (current goals can now be  found in the care plan section) Acute Rehab PT Goals Patient Stated Goal: return to work Progress towards PT goals: Goals met/education completed, patient discharged from PT    Frequency    Min 5X/week      PT Plan Current plan remains appropriate    Co-evaluation              End of Session Equipment Utilized During Treatment: Cervical collar;Gait belt Activity Tolerance: Patient tolerated treatment well Patient left: in bed;with call bell/phone within reach     Time: 1340-1355 PT Time Calculation (min) (ACUTE ONLY): 15 min  Charges:  $Therapeutic Activity: 8-22 mins                    G Codes:      Herbie Drape 04/19/2016, 2:50 PM

## 2016-04-19 NOTE — Progress Notes (Addendum)
Occupational Therapy Evaluation/Treatment Patient Details Name: Angela Pugh MRN: [id number] DOB: 1945/08/22 Today's Date: 04/19/2016    History of Present Illness Pt is a 70 y/o female s/p C3-C4 anterior cervical decompression and posterior foraminotomy at C6-C7/T1. PMH including but not limited to DM and HTN.   Clinical Impression   PTA, pt independent with all ADL and mobility and worked as a Consulting civil engineer. Pt having difficulty swallowing today and pain in RUE with shoulder movement. Began education on cervical precautions and ADL. Will follow acutely to address BUE ROM and strengthening as appropriate in addition to education on compensatory techniques for ADL. Pt asked for therapist to return with strengthening exercises for her hand. Pt seen for additional visit to teach pt theraputty and grip.pinch strengthening ex. Reviewed handouts. Pt very appreciative.   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    Follow Up Recommendations  Other (comment);Supervision/Assistance - intermittent;Outpatient OT as needed once pt cleared by MD to help facilitate return to work   Equipment Recommendations  None recommended by OT    Recommendations for Other Services       Precautions / Restrictions Precautions Precautions: Cervical Required Braces or Orthoses: Cervical Brace Cervical Brace: Hard collar;Other (comment) (per pt, MD said she did'nt need to wear it in bed) Restrictions Weight Bearing Restrictions: No      Mobility Bed Mobility Overal bed mobility: Needs Assistance             General bed mobility comments: S. likes to get OOB with HOB elevated. Educated on log rolling technqieus  Transfers Overall transfer level: Needs assistance Equipment used: None             General transfer comment: S    Balance     Sitting balance-Leahy Scale: Good       Standing balance-Leahy  Scale: Fair                              ADL Overall ADL's : Needs assistance/impaired Eating/Feeding: Set up;Supervision/ safety;Sitting (difficulty with swallowing. Able to topen containers)   Grooming: Moderate assistance Grooming Details (indicate cue type and reason): limited by pain. Difficulty reaching head for hair Upper Body Bathing: Minimal assitance;Sitting   Lower Body Bathing: Minimal assistance   Upper Body Dressing : Minimal assistance;Sitting   Lower Body Dressing: Minimal assistance   Toilet Transfer: Min guard             General ADL Comments: limited today by pain. Will have 24/7 assistance. Works as a Quarry manager. Wants to get back to work. Needs education on compensatory techniques for ADL with cervical precautions.      Vision     Perception     Praxis      Pertinent Vitals/Pain Pain Assessment: 0-10 Pain Score: 5  Pain Location: neck Pain Descriptors / Indicators: Discomfort;Burning     Hand Dominance Right   Extremity/Trunk Assessment Upper Extremity Assessment Upper Extremity Assessment: LUE deficits/detail;RUE deficits/detail RUE Deficits / Details: generalized weakness. grip strength @ 4/5. able to oppose thumb to all fingersmild atrophy noted in hand intrinsics; difficult to assess shoudler strength due to cervical surgery. Difficulty raising arm to 90 FF due to pain. Reports sensation has improved in hand since surgery RUE: Unable to fully assess due to pain RUE Sensation: decreased light touch RUE  Coordination: decreased gross motor LUE Deficits / Details: generalzied weakness. grip strength @ 3+/5. able to oppose thumb to all fingersreports L hand weaker than R prior to surgery but that it is improving daily. (staes she often drops items) LUE: Unable to fully assess due to pain LUE Sensation: decreased light touch LUE Coordination: decreased fine motor;decreased gross motor   Lower Extremity Assessment Lower Extremity  Assessment: Defer to PT evaluation   Cervical / Trunk Assessment Cervical / Trunk Assessment: Other exceptions Cervical / Trunk Exceptions: s/p cervical sx   Communication Communication Communication: No difficulties   Cognition Arousal/Alertness: Awake/alert Behavior During Therapy: WFL for tasks assessed/performed Overall Cognitive Status: Within Functional Limits for tasks assessed                     General Comments       Exercises Exercises: Other exercises Other Exercises Other Exercises: theraputty grip/pinch strengthening - yellow  handout given and reviewed Other Exercises: squeeze ball Other Exercises: gentle ROM R/L shoulder FF  to 90 AAROM   Shoulder Instructions      Home Living Family/patient expects to be discharged to:: Private residence Living Arrangements: Alone Available Help at Discharge: Family;Available 24 hours/day Type of Home: House Home Access: Stairs to enter [employer] of Steps: 2 Entrance Stairs-Rails: None Home Layout: One level     Bathroom Shower/Tub: Tub/shower unit Shower/tub characteristics: Architectural technologist: Standard Bathroom Accessibility: Yes How Accessible: Accessible via walker Home Equipment: [place] - 2 wheels;Cane - single point;Bedside commode;Shower seat;Hand held shower head          Prior Functioning/Environment Level of Independence: Independent        Comments: 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 Problem List: Decreased strength;Decreased range of motion;Decreased coordination;Decreased knowledge of precautions;Impaired sensation;Impaired UE functional use;Pain;Increased edema   OT Treatment/Interventions: Self-care/ADL training;Therapeutic exercise;DME and/or AE instruction;Therapeutic activities;Patient/family education    OT Goals(Current goals can be found in the care plan section) Acute Rehab OT Goals Patient Stated Goal: return to work OT Goal  Formulation: With patient Time For Goal Achievement: 04/26/16 Potential to Achieve Goals: Good ADL Goals Pt Will Perform Upper Body Bathing: with supervision;with caregiver independent in assisting;sitting Pt Will Perform Lower Body Bathing: with supervision;with caregiver independent in assisting;sit to/from stand Pt Will Perform Upper Body Dressing: with supervision;with caregiver independent in assisting;sitting (including donning/doffing Aspen collar) Pt Will Perform Lower Body Dressing: with supervision;with caregiver independent in assisting;sit to/from stand Pt Will Transfer to Toilet: with modified independence;ambulating Pt/caregiver will Perform Home Exercise Program: Increased ROM;Both right and left upper extremity;With theraputty;With written HEP provided;With Supervision (table slides/wall slides to 90 shoulder FF) Additional ADL Goal #1: Pt will independently verbalize cervical precuations for ADL  OT Frequency: Min 2X/week   Barriers to D/C:            Co-evaluation              End of Session Nurse Communication: Mobility status  Activity Tolerance: Patient limited by pain Patient left: in bed;with call bell/phone within reach;with bed alarm set   Time: 1130-1152 OT Time Calculation (min): 22 min  Second session time: 1220 - 1236 Time (min) 16 min  Charges:  OT General Charges $OT Visit:  (2 visits) OT Evaluation $OT Eval Moderate Complexity: 1 Procedure OT Treatments $Therapeutic Activity: 8-22 mins G-Codes:    Gohan Collister,[place] 05-04-16, 12:38 PM   Morgann Woodburn, OTR/L  [phone] 04/19/2016 

## 2016-04-20 ENCOUNTER — Encounter (HOSPITAL_COMMUNITY): Payer: Self-pay | Admitting: Neurosurgery

## 2016-04-20 LAB — GLUCOSE, CAPILLARY
Glucose-Capillary: 155 mg/dL — ABNORMAL HIGH (ref 65–99)
Glucose-Capillary: 206 mg/dL — ABNORMAL HIGH (ref 65–99)
Glucose-Capillary: 118 mg/dL — ABNORMAL HIGH (ref 65–99)

## 2016-04-20 MED ORDER — TIZANIDINE HCL 4 MG PO TABS: 4.0000 mg | ORAL_TABLET | Freq: Four times a day (QID) | ORAL | 0 refills | Status: DC | PRN

## 2016-04-20 MED ORDER — OXYCODONE HCL 5 MG PO TABS: 5.0000 mg | ORAL_TABLET | Freq: Four times a day (QID) | ORAL | 0 refills | Status: DC | PRN

## 2016-04-20 NOTE — Discharge Instructions (Signed)

## 2016-04-20 NOTE — Discharge Summary (Signed)
Physician Discharge Summary  Patient ID: Angela Pugh MRN: [id number] DOB/AGE: 69/03/1946 69 y.o.  Admit date: 04/17/2016 Discharge date: 04/20/2016  Admission Diagnoses:Cervical hnp C3/4, Cervical spondylolisthesis C7/T1  Discharge Diagnoses: Cervical hnp C3/4, Cervical spondylolisthesis C7/T1  Active Problems:   HNP (herniated nucleus pulposus) with myelopathy, cervical   Discharged Condition: good  Hospital Course: Angela Pugh was admitted and taken to the operating room for an uncomplicated acdf at [id number] with Zero P, and posterior cervical decompression C7, and C8 roots with arthrodesis for left upper extremity pain, and hand weakness. At discharge the wounds are clean, dry, and without signs of infection. She is moving all extremities well, and voiding.   Treatments: surgery: as above.   Discharge Exam: Blood pressure (!) 112/59, pulse 66, temperature 98.7 F (37.1 C), temperature source Oral, resp. rate 20, height 5' 9.5" (1.765 m), weight 80.7 kg (178 lb), SpO2 98 %. General appearance: alert, cooperative and mild distress Neurologic: Alert and oriented X 3, normal strength and tone. Normal symmetric reflexes. Normal coordination and gait There is mild weakness in hand intrinsics Disposition: 01-Home or Self Care SPONDYLOSIS WITH RADICULOPATHY, CERVICAL    Medication List    TAKE these medications   amLODipine 5 MG tablet Commonly known as:  NORVASC Take 5 mg by mouth at bedtime.   docusate sodium 100 MG capsule Commonly known as:  COLACE Take 1 capsule (100 mg total) by mouth 2 (two) times daily as needed for mild constipation.   ferrous sulfate 325 (65 FE) MG EC tablet Take 325 mg by mouth at bedtime.   GAS-X PO Take 1 tablet by mouth daily as needed (gas).   GOODY HEADACHE PO Take 1 packet by mouth daily as needed (pain).   hydrocodone-ibuprofen 5-200 MG tablet Commonly known as:  VICOPROFEN Take 1 tablet by mouth every 6 (six) hours as needed for pain.    losartan 100 MG tablet Commonly known as:  COZAAR Take 1 tablet (100 mg total) by mouth daily. What changed:  when to take this   oxyCODONE 5 MG immediate release tablet Commonly known as:  Oxy IR/ROXICODONE Take 1 tablet (5 mg total) by mouth every 6 (six) hours as needed for severe pain.   potassium chloride 20 MEQ/15ML (10%) Soln Take 15 mLs (20 mEq total) by mouth daily.   tiZANidine 4 MG tablet Commonly known as:  ZANAFLEX Take 1 tablet (4 mg total) by mouth every 6 (six) hours as needed for muscle spasms.   VISINE OP Apply 1 drop to eye daily as needed (dry eyes).      Follow-up Information    Mariel Gaudin L, MD Follow up in 3 day(s).   Specialty:  Neurosurgery Why:  please call the office to make an appoinment. Contact information: [address] [place] [address] [phone]           Signed: Winfield Cunas 04/20/2016, 7:40 PM

## 2016-04-20 NOTE — Progress Notes (Signed)
Occupational Therapy Treatment Patient Details Name: Angela Pugh MRN: [id number] DOB: [birth year] Today's Date: 04/20/2016    History of present illness Pt is a 70 y/o female s/p C3-C4 anterior cervical decompression and posterior foraminotomy at C6-C7/T1. PMH including but not limited to DM and HTN.   OT comments  Pt currently demonstrates level of transfers, exercise and ADLS that is adequate for home d/c with family (A) PRN. Pt reports neighbors will bring food PRN upon d/c. Pt pleasant with good return demo and no pain to report.    Follow Up Recommendations  Other (comment);Supervision/Assistance - 24 hour;Outpatient OT    Equipment Recommendations  None recommended by OT    Recommendations for Other Services      Precautions / Restrictions Precautions Precautions: Cervical Precaution Comments: able to don brace MOD I  Required Braces or Orthoses: Cervical Brace Cervical Brace: Hard collar;Other (comment)       Mobility Bed Mobility Overal bed mobility: Modified Independent                Transfers Overall transfer level: Modified independent                    Balance                                   ADL Overall ADL's : Needs assistance/impaired Eating/Feeding: Modified independent                                     General ADL Comments: Pt return demo of wall climbs FF90 degrees, fine motor hand exercises 1-5 on sheet, has theraputty / ball present adn reports doing exercises this AM with materials. Pt able to don brace mod I. pt completed bed mobility and transfers mod I. Pt has daughter present and reports pain controlled and good nights rest      Vision                     Perception     Praxis      Cognition   Behavior During Therapy: WFL for tasks assessed/performed Overall Cognitive Status: Within Functional Limits for tasks assessed                       Extremity/Trunk  Assessment               Exercises     Shoulder Instructions       General Comments      Pertinent Vitals/ Pain       Pain Assessment: No/denies pain  Home Living                                          Prior Functioning/Environment              Frequency  Min 2X/week        Progress Toward Goals  OT Goals(current goals can now be found in the care plan section)  Progress towards OT goals: Progressing toward goals  Acute Rehab OT Goals Patient Stated Goal: return to work OT Goal Formulation: With patient Time For Goal Achievement: 04/26/16 Potential to Achieve Goals: Good ADL Goals Pt Will Perform Upper Body  Bathing: with supervision;with caregiver independent in assisting;sitting Pt Will Perform Lower Body Bathing: with supervision;with caregiver independent in assisting;sit to/from stand Pt Will Perform Upper Body Dressing: with supervision;with caregiver independent in assisting;sitting Pt Will Perform Lower Body Dressing: with supervision;with caregiver independent in assisting;sit to/from stand Pt Will Transfer to Toilet: with modified independence;ambulating Pt/caregiver will Perform Home Exercise Program: Increased ROM;Both right and left upper extremity;With theraputty;With written HEP provided;With Supervision Additional ADL Goal #1: Pt will independently verbalize cervical precuations for ADL  Plan Discharge plan remains appropriate    Co-evaluation                 End of Session Equipment Utilized During Treatment: Gait belt;Rolling walker   Activity Tolerance Patient tolerated treatment well   Patient Left in bed;with call bell/phone within reach;with family/visitor present   Nurse Communication Mobility status;Precautions        Time: [id number] OT Time Calculation (min): 24 min  Charges: OT General Charges $OT Visit: 1 Procedure OT Treatments $Therapeutic Activity: 8-22 mins $Therapeutic Exercise: 8-22  mins  Parke Poisson B 04/20/2016, 8:41 AM   Jeri Modena   OTR/L Pager: [phone] Office: [phone] .

## 2016-04-20 NOTE — Progress Notes (Signed)
Discharge orders received. Pt for discharge home today. IV D/C'd. Dressing CDI to posterior neck. Prescription and D/C instructions given with verbalized understanding. Family at bedside to assist with D/C. Staff brought Pt downstairs via wheelchair for D/C. Fortino Sic, RN, BSN 04/20/2016 8:12 PM

## 2016-04-20 NOTE — Care Management Note (Addendum)
Case Management Note  Patient Details  Name: Angela Pugh MRN: [id number] Date of Birth: [birth year]  Subjective/Objective:   Pt s/p cervical surgery. She is from home alone.                  Action/Plan: PT/OT recommending supervision only. Pt states she has 24/7 support at home. She also states she has DME at home. CM following for d/c needs.   Expected Discharge Date:                  Expected Discharge Plan:  Home/Self Care  In-House Referral:     Discharge planning Services     Post Acute Care Choice:    Choice offered to:     DME Arranged:    DME Agency:     HH Arranged:    HH Agency:     Status of Service:  In process, will continue to follow  If discussed at Long Length of Stay Meetings, dates discussed:    Additional Comments:  Pollie Friar, RN 04/20/2016, 11:23 AM

## 2016-05-04 ENCOUNTER — Inpatient Hospital Stay (HOSPITAL_COMMUNITY)
Admission: AD | Admit: 2016-05-04 | Discharge: 2016-05-06 | DRG: 391 | Disposition: A | Discharge status: Home / Self Care | Payer: Medicare Other | Source: Ambulatory Visit | Attending: Neurosurgery | Admitting: Neurosurgery

## 2016-05-04 ENCOUNTER — Encounter (HOSPITAL_COMMUNITY): Payer: Self-pay | Admitting: General Practice

## 2016-05-04 DIAGNOSIS — Z886 Allergy status to analgesic agent status: Secondary | ICD-10-CM | POA: Diagnosis not present

## 2016-05-04 DIAGNOSIS — Z981 Arthrodesis status: Secondary | ICD-10-CM

## 2016-05-04 DIAGNOSIS — E43 Unspecified severe protein-calorie malnutrition: Secondary | ICD-10-CM | POA: Diagnosis present

## 2016-05-04 DIAGNOSIS — K56609 Unspecified intestinal obstruction, unspecified as to partial versus complete obstruction: Secondary | ICD-10-CM

## 2016-05-04 DIAGNOSIS — R1310 Dysphagia, unspecified: Secondary | ICD-10-CM | POA: Diagnosis present

## 2016-05-04 DIAGNOSIS — Z87891 Personal history of nicotine dependence: Secondary | ICD-10-CM

## 2016-05-04 DIAGNOSIS — Z79899 Other long term (current) drug therapy: Secondary | ICD-10-CM | POA: Diagnosis not present

## 2016-05-04 DIAGNOSIS — Z888 Allergy status to other drugs, medicaments and biological substances status: Secondary | ICD-10-CM

## 2016-05-04 DIAGNOSIS — R627 Adult failure to thrive: Secondary | ICD-10-CM | POA: Diagnosis present

## 2016-05-04 HISTORY — DX: Headache, unspecified: R51.9

## 2016-05-04 HISTORY — DX: Migraine, unspecified, not intractable, without status migrainosus: G43.909

## 2016-05-04 HISTORY — DX: Headache: R51

## 2016-05-04 HISTORY — DX: Type 2 diabetes mellitus without complications: E11.9

## 2016-05-04 LAB — COMPREHENSIVE METABOLIC PANEL
ALBUMIN: 3.2 g/dL — AB (ref 3.5–5.0)
ALT: 16 U/L (ref 14–54)
AST: 14 U/L — AB (ref 15–41)
Alkaline Phosphatase: 88 U/L (ref 38–126)
Anion gap: 10 (ref 5–15)
BUN: 12 mg/dL (ref 6–20)
CHLORIDE: 107 mmol/L (ref 101–111)
CO2: 25 mmol/L (ref 22–32)
CREATININE: 0.87 mg/dL (ref 0.44–1.00)
Calcium: 8.8 mg/dL — ABNORMAL LOW (ref 8.9–10.3)
GFR calc Af Amer: >60 mL/min (ref >60)
GFR calc non Af Amer: >60 mL/min (ref >60)
GLUCOSE: 168 mg/dL — AB (ref 65–99)
POTASSIUM: 3.5 mmol/L (ref 3.5–5.1)
Sodium: 142 mmol/L (ref 135–145)
Total Bilirubin: 1.2 mg/dL (ref 0.3–1.2)
Total Protein: 7 g/dL (ref 6.5–8.1)

## 2016-05-04 LAB — CBC WITH DIFFERENTIAL/PLATELET
BASOS PCT: 0 %
Basophils Absolute: 0 10*3/uL (ref 0.0–0.1)
EOS PCT: 0 %
Eosinophils Absolute: 0 10*3/uL (ref 0.0–0.7)
HEMATOCRIT: 42.2 % (ref 36.0–46.0)
Hemoglobin: 14.4 g/dL (ref 12.0–15.0)
LYMPHS ABS: 3.5 10*3/uL (ref 0.7–4.0)
Lymphocytes Relative: 36 %
MCH: 29.9 pg (ref 26.0–34.0)
MCHC: 34.1 g/dL (ref 30.0–36.0)
MCV: 87.6 fL (ref 78.0–100.0)
MONO ABS: 0.8 10*3/uL (ref 0.1–1.0)
Monocytes Relative: 8 %
NEUTROS ABS: 5.4 10*3/uL (ref 1.7–7.7)
Neutrophils Relative %: 56 %
Platelets: 466 10*3/uL — ABNORMAL HIGH (ref 150–400)
RBC: 4.82 MIL/uL (ref 3.87–5.11)
RDW: 16.8 % — AB (ref 11.5–15.5)
WBC: 9.7 10*3/uL (ref 4.0–10.5)

## 2016-05-04 LAB — GLUCOSE, CAPILLARY
GLUCOSE-CAPILLARY: 116 mg/dL — AB (ref 65–99)
Glucose-Capillary: 145 mg/dL — ABNORMAL HIGH (ref 65–99)
Glucose-Capillary: 271 mg/dL — ABNORMAL HIGH (ref 65–99)

## 2016-05-04 MED ORDER — AMLODIPINE BESYLATE 5 MG PO TABS
5.0000 mg | ORAL_TABLET | Freq: Every day | ORAL | Status: DC
  Administered 2016-05-04 – 2016-05-05 (×2): 5 mg via ORAL
  Filled 2016-05-04 (×2): qty 1

## 2016-05-04 MED ORDER — SIMETHICONE 80 MG PO CHEW: 80.0000 mg | CHEWABLE_TABLET | Freq: Every day | ORAL | Status: DC | PRN

## 2016-05-04 MED ORDER — SODIUM CHLORIDE 0.9% FLUSH
3.0000 mL | Freq: Two times a day (BID) | INTRAVENOUS | Status: DC
Start: 2016-05-04 — End: 2016-05-06
  Administered 2016-05-04 – 2016-05-06 (×4): 3 mL via INTRAVENOUS

## 2016-05-04 MED ORDER — HEPARIN SODIUM (PORCINE) 5000 UNIT/ML IJ SOLN
5000.0000 [IU] | Freq: Three times a day (TID) | INTRAMUSCULAR | Status: DC
  Administered 2016-05-04 – 2016-05-06 (×7): 5000 [IU] via SUBCUTANEOUS
  Filled 2016-05-04 (×7): qty 1

## 2016-05-04 MED ORDER — SODIUM CHLORIDE 0.9% FLUSH: 3.0000 mL | INTRAVENOUS | Status: DC | PRN

## 2016-05-04 MED ORDER — ONDANSETRON HCL 4 MG PO TABS: 4.0000 mg | ORAL_TABLET | Freq: Four times a day (QID) | ORAL | Status: DC | PRN

## 2016-05-04 MED ORDER — POTASSIUM CHLORIDE 20 MEQ/15ML (10%) PO SOLN
20.0000 meq | Freq: Every day | ORAL | Status: DC
  Administered 2016-05-04 – 2016-05-06 (×3): 20 meq via ORAL
  Filled 2016-05-04 (×3): qty 15

## 2016-05-04 MED ORDER — TETRAHYDROZOLINE HCL 0.05 % OP SOLN
2.0000 [drp] | Freq: Every day | OPHTHALMIC | Status: DC | PRN
Start: 2016-05-04 — End: 2016-05-04

## 2016-05-04 MED ORDER — DOCUSATE SODIUM 100 MG PO CAPS
100.0000 mg | ORAL_CAPSULE | Freq: Two times a day (BID) | ORAL | Status: DC | PRN
Start: 2016-05-04 — End: 2016-05-06

## 2016-05-04 MED ORDER — OXYCODONE HCL 5 MG PO TABS
5.0000 mg | ORAL_TABLET | ORAL | Status: DC | PRN
  Administered 2016-05-04 (×2): 5 mg via ORAL
  Filled 2016-05-04 (×3): qty 1

## 2016-05-04 MED ORDER — FERROUS SULFATE 325 (65 FE) MG PO TABS
325.0000 mg | ORAL_TABLET | Freq: Every day | ORAL | Status: DC
  Administered 2016-05-04 – 2016-05-05 (×2): 325 mg via ORAL
  Filled 2016-05-04 (×2): qty 1

## 2016-05-04 MED ORDER — ONDANSETRON HCL 4 MG/2ML IJ SOLN
4.0000 mg | Freq: Four times a day (QID) | INTRAMUSCULAR | Status: DC | PRN
  Administered 2016-05-04 – 2016-05-05 (×2): 4 mg via INTRAVENOUS
  Filled 2016-05-04 (×2): qty 2

## 2016-05-04 MED ORDER — SODIUM CHLORIDE 0.9 % IV SOLN: 250.0000 mL | INTRAVENOUS | Status: DC | PRN

## 2016-05-04 MED ORDER — OXYCODONE HCL 5 MG PO TABS: 5.0000 mg | ORAL_TABLET | Freq: Four times a day (QID) | ORAL | Status: DC | PRN

## 2016-05-04 MED ORDER — LOSARTAN POTASSIUM 50 MG PO TABS
100.0000 mg | ORAL_TABLET | Freq: Every day | ORAL | Status: DC
  Administered 2016-05-04 – 2016-05-05 (×2): 100 mg via ORAL
  Filled 2016-05-04 (×2): qty 2

## 2016-05-04 MED ORDER — POTASSIUM CHLORIDE IN NACL 20-0.9 MEQ/L-% IV SOLN
INTRAVENOUS | Status: DC
  Administered 2016-05-04 – 2016-05-06 (×4): via INTRAVENOUS
  Filled 2016-05-04 (×7): qty 1000

## 2016-05-04 MED ORDER — HYDROCODONE-IBUPROFEN 5-200 MG PO TABS: 1.0000 | ORAL_TABLET | Freq: Four times a day (QID) | ORAL | Status: DC | PRN

## 2016-05-04 MED ORDER — BOOST / RESOURCE BREEZE PO LIQD
1.0000 | Freq: Three times a day (TID) | ORAL | Status: DC
  Administered 2016-05-04 – 2016-05-06 (×6): 1 via ORAL
  Filled 2016-05-04 (×9): qty 1

## 2016-05-04 MED ORDER — TIZANIDINE HCL 4 MG PO TABS: 4.0000 mg | ORAL_TABLET | Freq: Four times a day (QID) | ORAL | Status: DC | PRN

## 2016-05-04 NOTE — Progress Notes (Signed)
Pt admitted to the unit as a direct admit to room 5c06. Pt A&O x4; anterior neck incision remains approximated and intact with dermabond; posterior neck incision healed and scabbed; no drainage or active bleeding noted. Pt oriented to the unit and room; call light within reach; fall/safety prevention and precaution education completed with pt and daughter at bedside; all voices understanding and denies any questions. Pt skin intact with no pressure ulcer or wounds noted except for old surgical incisions. No orders yet; Dr. Christella Noa called and notified of pt arrival to the unit; awaiting on orders from MD. IV established to left hand; bed alarm on; will continue to closely monitor pt. Delia Heady RN

## 2016-05-04 NOTE — H&P (Signed)
BP 119/69 (BP Location: Left Arm)   Pulse 74   Temp 98.3 F (36.8 C) (Oral)   Resp 20   SpO2 97%  [name] is admitted today for failure to thrive. She was operated on 04/17/2016 and was discharged doing well. She was moving all extremities well and continues to do so. Plain xray shows hardware intact. Having problems swallowing. Wound is clean, dry, soft no signs of infection.  Allergies  Allergen Reactions  . Acetaminophen Hives and Nausea Only  . Atorvastatin Nausea Only    weakness   Prior to Admission medications   Medication Sig Start Date End Date Taking? Authorizing Provider  amLODipine (NORVASC) 5 MG tablet Take 5 mg by mouth at bedtime.  11/26/15  Yes Historical Provider, MD  Aspirin-Acetaminophen-Caffeine (GOODY HEADACHE PO) Take 1 packet by mouth daily as needed (pain).   Yes Historical Provider, MD  docusate sodium (COLACE) 100 MG capsule Take 1 capsule (100 mg total) by mouth 2 (two) times daily as needed for mild constipation. 02/03/16  Yes Clanford Marisa Hua, MD  ferrous sulfate 325 (65 FE) MG EC tablet Take 325 mg by mouth at bedtime.   Yes Historical Provider, MD  hydrocodone-ibuprofen (VICOPROFEN) 5-200 MG tablet Take 1 tablet by mouth every 6 (six) hours as needed for pain.  12/30/15  Yes Historical Provider, MD  losartan (COZAAR) 100 MG tablet Take 1 tablet (100 mg total) by mouth daily. Patient taking differently: Take 100 mg by mouth at bedtime.  02/03/16  Yes Clanford Marisa Hua, MD  oxyCODONE (OXY IR/ROXICODONE) 5 MG immediate release tablet Take 1 tablet (5 mg total) by mouth every 6 (six) hours as needed for severe pain. 04/20/16  Yes Ashok Pall, MD  tiZANidine (ZANAFLEX) 4 MG tablet Take 1 tablet (4 mg total) by mouth every 6 (six) hours as needed for muscle spasms. 04/20/16  Yes Ashok Pall, MD   Past Surgical History:  Procedure Laterality Date  . ABDOMINAL HYSTERECTOMY    . ANTERIOR CERVICAL DECOMP/DISCECTOMY FUSION N/A 04/17/2016   Procedure: ANTERIOR CERVICAL  DECOMPRESSION/DISCECTOMY FUSION CERVICAL THREE - CERVICAL FOUR, POSTERIOR CERVICAL DISCECTOMY CERVICAL SEVEN -THORASIC ONE LEFT;  Surgeon: Ashok Pall, MD;  Location: [facility];  Service: Neurosurgery;  Laterality: N/A;  Anterior/Posterior  . ANTERIOR FUSION CERVICAL SPINE  2000  . bone spurs     removed  . LUMBAR LAMINECTOMY Left 06/30/2013   Procedure: Left L3-4 Extraforaminal approach to excise far lateral herniated nucleus pulposus;  Surgeon: Jessy Oto, MD;  Location: [facility];  Service: Orthopedics;  Laterality: Left;   Family History  Problem Relation Age of Onset  . Uterine cancer Mother   . Diabetes type II Sister   . Diabetes Mellitus II Brother    Social History   Social History  . Marital status: Single    Spouse name: N/A  . Number of children: N/A  . Years of education: N/A   Occupational History  . Not on file.   Social History Main Topics  . Smoking status: Former Smoker    Quit date: 06/30/1982  . Smokeless tobacco: Never Used  . Alcohol use Yes     Comment: ocass. - social   . Drug use: No  . Sexual activity: Not on file   Other Topics Concern  . Not on file   Social History Narrative  . No narrative on file   Physical Exam  Constitutional: She is oriented to person, place, and time. She appears well-developed and well-nourished. She  appears distressed.  HENT:  Head: Normocephalic and atraumatic.  Eyes: EOM are normal. Pupils are equal, round, and reactive to light.  Neck: Normal range of motion.  Cardiovascular: Normal rate, regular rhythm, normal heart sounds and intact distal pulses.   Pulmonary/Chest: Effort normal and breath sounds normal.  Abdominal: Soft. Bowel sounds are normal.  Neurological: She is alert and oriented to person, place, and time. She displays normal reflexes. No cranial nerve deficit or sensory deficit. She exhibits normal muscle tone. Coordination normal.  Skin: Skin is warm and dry.  Psychiatric: She has a normal mood and  affect. Her behavior is normal. Judgment and thought content normal.   Results for orders placed or performed during the hospital encounter of 05/04/16 (from the past 24 hour(s))  Glucose, capillary     Status: Abnormal   Collection Time: 05/04/16  1:20 PM  Result Value Ref Range   Glucose-Capillary 116 (H) 65 - 99 mg/dL  Glucose, capillary     Status: Abnormal   Collection Time: 05/04/16  4:45 PM  Result Value Ref Range   Glucose-Capillary 145 (H) 65 - 99 mg/dL  Comprehensive metabolic panel     Status: Abnormal   Collection Time: 05/04/16  5:10 PM  Result Value Ref Range   Sodium 142 135 - 145 mmol/L   Potassium 3.5 3.5 - 5.1 mmol/L   Chloride 107 101 - 111 mmol/L   CO2 25 22 - 32 mmol/L   Glucose, Bld 168 (H) 65 - 99 mg/dL   BUN 12 6 - 20 mg/dL   Creatinine, Ser 0.87 0.44 - 1.00 mg/dL   Calcium 8.8 (L) 8.9 - 10.3 mg/dL   Total Protein 7.0 6.5 - 8.1 g/dL   Albumin 3.2 (L) 3.5 - 5.0 g/dL   AST 14 (L) 15 - 41 U/L   ALT 16 14 - 54 U/L   Alkaline Phosphatase 88 38 - 126 U/L   Total Bilirubin 1.2 0.3 - 1.2 mg/dL   GFR calc non Af Amer >60 >60 mL/min   GFR calc Af Amer >60 >60 mL/min   Anion gap 10 5 - 15  CBC WITH DIFFERENTIAL     Status: Abnormal   Collection Time: 05/04/16  5:10 PM  Result Value Ref Range   WBC 9.7 4.0 - 10.5 K/uL   RBC 4.82 3.87 - 5.11 MIL/uL   Hemoglobin 14.4 12.0 - 15.0 g/dL   HCT 42.2 36.0 - 46.0 %   MCV 87.6 78.0 - 100.0 fL   MCH 29.9 26.0 - 34.0 pg   MCHC 34.1 30.0 - 36.0 g/dL   RDW 16.8 (H) 11.5 - 15.5 %   Platelets 466 (H) 150 - 400 K/uL   Neutrophils Relative % 56 %   Lymphocytes Relative 36 %   Monocytes Relative 8 %   Eosinophils Relative 0 %   Basophils Relative 0 %   Neutro Abs 5.4 1.7 - 7.7 K/uL   Lymphs Abs 3.5 0.7 - 4.0 K/uL   Monocytes Absolute 0.8 0.1 - 1.0 K/uL   Eosinophils Absolute 0.0 0.0 - 0.7 K/uL   Basophils Absolute 0.0 0.0 - 0.1 K/uL   Smear Review MORPHOLOGY UNREMARKABLE   Angela Pugh presented with difficulty swallowing  despite steroids. Xray does not show abnormal swelling prevertebraly. Will admit for IV hydration. Labs look good.  Will monitor.

## 2016-05-05 ENCOUNTER — Inpatient Hospital Stay (HOSPITAL_COMMUNITY): Payer: Medicare Other

## 2016-05-05 DIAGNOSIS — E43 Unspecified severe protein-calorie malnutrition: Secondary | ICD-10-CM | POA: Insufficient documentation

## 2016-05-05 LAB — LIPASE, BLOOD: Lipase: 43 U/L (ref 11–51)

## 2016-05-05 LAB — GLUCOSE, CAPILLARY
GLUCOSE-CAPILLARY: 151 mg/dL — AB (ref 65–99)
GLUCOSE-CAPILLARY: 173 mg/dL — AB (ref 65–99)
GLUCOSE-CAPILLARY: 187 mg/dL — AB (ref 65–99)
Glucose-Capillary: 155 mg/dL — ABNORMAL HIGH (ref 65–99)

## 2016-05-05 LAB — AMYLASE: Amylase: 80 U/L (ref 28–100)

## 2016-05-05 MED ORDER — ALUM & MAG HYDROXIDE-SIMETH 200-200-20 MG/5ML PO SUSP
30.0000 mL | ORAL | Status: DC | PRN
  Administered 2016-05-05 (×2): 30 mL via ORAL
  Filled 2016-05-05 (×2): qty 30

## 2016-05-05 MED ORDER — INSULIN ASPART 100 UNIT/ML ~~LOC~~ SOLN
0.0000 [IU] | Freq: Three times a day (TID) | SUBCUTANEOUS | Status: DC
  Administered 2016-05-06: 1 [IU] via SUBCUTANEOUS
  Administered 2016-05-06: 3 [IU] via SUBCUTANEOUS

## 2016-05-05 MED ORDER — INSULIN ASPART 100 UNIT/ML ~~LOC~~ SOLN: 0.0000 [IU] | Freq: Every day | SUBCUTANEOUS | Status: DC

## 2016-05-05 MED ORDER — ADULT MULTIVITAMIN W/MINERALS CH
1.0000 | ORAL_TABLET | Freq: Every day | ORAL | Status: DC
  Administered 2016-05-06: 1 via ORAL
  Filled 2016-05-05: qty 1

## 2016-05-05 NOTE — Progress Notes (Signed)
Inpatient Diabetes Program Recommendations  AACE/ADA: New Consensus Statement on Inpatient Glycemic Control (2015)  Target Ranges:  Prepandial:   less than 140 mg/dL      Peak postprandial:   less than 180 mg/dL (1-2 hours)      Critically ill patients:  140 - 180 mg/dL   Lab Results  Component Value Date   GLUCAP 271 (H) 05/04/2016   HGBA1C 6.2 (H) 04/14/2016    Review of Glycemic Control  Results for LATAYA, SOLLARS (MRN [id number]) as of 05/05/2016 09:55  Ref. Range 05/04/2016 13:20 05/04/2016 16:45 05/04/2016 22:16  Glucose-Capillary Latest Ref Range: 65 - 99 mg/dL 116 (H) 145 (H) 271 (H)    Diabetes history: Type 2, A1C 6.2% on 04/14/16 Outpatient Diabetes medications: none Current orders for Inpatient glycemic control: none  Inpatient Diabetes Program Recommendations:   Consider ordering CBG tid and hs and consider adding Novolog sensitive correction tid and Novolog 0-5 units qhs  Gentry Fitz, RN, [place], [place], CDE Diabetes Coordinator Inpatient Diabetes Program  [phone] (Team Pager) [phone] ([place]) 05/05/2016 7:47 AM

## 2016-05-05 NOTE — Progress Notes (Signed)
Patient ID: Angela Pugh, female   DOB: February 02, 1946, 70 y.o.   MRN: [id number] BP (!) 135/58 (BP Location: Left Arm)   Pulse 70   Temp 99.4 F (37.4 C) (Oral)   Resp 20   SpO2 97%  Alert, currently with emesis Obvious distress, feels ill in her words I will check amylase and lipase. Will send for a kub and lateral decubitus.  Has not had a normal bowel movement in approximately a week.

## 2016-05-05 NOTE — Care Management Note (Signed)
Case Management Note  Patient Details  Name: Angela Pugh MRN: [id number] Date of Birth: 1945-12-04  Subjective/Objective:  Pt admitted with dysphagia. She is from home but has family support.                 Action/Plan: Plan is to return home when medically ready. CM following for d/c needs.   Expected Discharge Date:                  Expected Discharge Plan:  Home/Self Care  In-House Referral:     Discharge planning Services     Post Acute Care Choice:    Choice offered to:     DME Arranged:    DME Agency:     HH Arranged:    HH Agency:     Status of Service:  In process, will continue to follow  If discussed at Long Length of Stay Meetings, dates discussed:    Additional Comments:  Pollie Friar, RN 05/05/2016, 11:26 AM

## 2016-05-05 NOTE — Progress Notes (Signed)
Initial Nutrition Assessment  DOCUMENTATION CODES:   Severe malnutrition in context of acute illness/injury  INTERVENTION:  Boost Breeze po TID, each supplement provides 250 kcal and 9 grams of protein Diet advancement per MD   NUTRITION DIAGNOSIS:   Malnutrition related to acute illness as evidenced by energy intake < or equal to 50% for > or equal to 5 days, moderate depletion of body fat, moderate depletions of muscle mass.   GOAL:   Patient will meet greater than or equal to 90% of their needs    MONITOR:   Diet advancement, Supplement acceptance, PO intake, Weight trends, Labs, Skin  REASON FOR ASSESSMENT:   Malnutrition Screening Tool    ASSESSMENT:   70 year old female with history of HTN, HLD, and T2DM admitted for failure to thrive. She was operated on 04/17/2016 and was discharged doing well. She was moving all extremities well and continues to do so. Plain xray shows hardware intact. Having problems swallowing.  Pt reports that since surgery on 12/1 she has been unable to keep food or drinks down. She reports that everything seems to get stuck in her throat and then come back up. She states that in the past two weeks there were a few times she was able to tolerate 4 ounces of Ensure. Last night she was able to keep down some broth and a Boost Breeze supplement. She was feeling nauseous at time of visit.  Pt thinks she has lost 25 lbs in the past 2 weeks, however no new weight obtained on admission and bed scale was inaccurate at time of visit. Pt has noticed her clothes are fitting much looser. Nutrition focused physical exam was performed and moderate fat wasting and moderate muscle wasting were noted on exam.    Labs: low albumin, low calcium   Diet Order:  Diet clear liquid Room service appropriate? Yes; Fluid consistency: Thin  Skin:  Reviewed, no issues (closed incision on neck)  Last BM:  12/18  Height:   Ht Readings from Last 1 Encounters:  04/17/16  5' 9.5" (1.765 m)    Weight:   Wt Readings from Last 1 Encounters:  04/17/16 178 lb (80.7 kg)    Ideal Body Weight:  74.1 kg  BMI:  There is no height or weight on file to calculate BMI.  Estimated Nutritional Needs:   Kcal:  1800-2000  Protein:  85-95 grams  Fluid:  2 L/day  EDUCATION NEEDS:   No education needs identified at this time  Scarlette Ar RD, CSP, LDN Inpatient Clinical Dietitian Pager: [phone] After Hours Pager: [phone]

## 2016-05-06 LAB — GLUCOSE, CAPILLARY
GLUCOSE-CAPILLARY: 201 mg/dL — AB (ref 65–99)
Glucose-Capillary: 133 mg/dL — ABNORMAL HIGH (ref 65–99)
Glucose-Capillary: 191 mg/dL — ABNORMAL HIGH (ref 65–99)

## 2016-05-06 NOTE — Discharge Instructions (Signed)
Call the office if you are having any problems with swallowing, or severe pain.

## 2016-05-06 NOTE — Progress Notes (Signed)
MD office was called and made aware of patient low grade temperature and patient saying she is weak. Vital sign was within normal limit. No new order was received.

## 2016-05-06 NOTE — Discharge Summary (Signed)
Physician Discharge Summary  Patient ID: Angela Pugh MRN: [id number] DOB/AGE: [birth year] 70 y.o.  Admit date: 05/04/2016 Discharge date: 05/06/2016  Admission [facility],   Discharge Diagnoses:  Active Problems:   Dysphagia   Protein-calorie malnutrition, severe   Discharged Condition: good  Hospital Course: [name] was admitted for failure to thrive. She had difficulties swallowing and was dehydrated. After admission and IV fluids she did improve. At discharge she is tolerating a regular diet, has had a bowel movement, and states she feels better. Lab results were within normal limits except for dietary indicators which were low.   Treatments: IV hydration  Discharge Exam: Blood pressure (!) 144/60, pulse 64, temperature 98.1 F (36.7 C), temperature source Oral, resp. rate 20, SpO2 98 %. General appearance: alert, cooperative, appears stated age and no distress  Disposition: 01-Home or Self Care * No surgery found *   Follow-up Information    Eddith Mentor L, MD Follow up in 2 week(s).   Specialty:  Neurosurgery Why:  call to make an appointment Contact information: [address] [place] [address] [phone]           Signed: Winfield Cunas 05/06/2016, 6:44 PM

## 2016-05-06 NOTE — Progress Notes (Signed)
Patient discharged, discharge instructions given to patient with family at bedside. IV removed. Patient wheeled via wheelchair to entrance by NT with family to go home.

## 2016-06-10 HISTORY — PX: COLONOSCOPY: SHX174

## 2016-06-17 ENCOUNTER — Encounter: Payer: Self-pay | Admitting: Podiatry

## 2016-06-17 ENCOUNTER — Ambulatory Visit (INDEPENDENT_AMBULATORY_CARE_PROVIDER_SITE_OTHER): Payer: Medicare Other | Admitting: Podiatry

## 2016-06-17 DIAGNOSIS — B351 Tinea unguium: Secondary | ICD-10-CM

## 2016-06-17 DIAGNOSIS — Q828 Other specified congenital malformations of skin: Secondary | ICD-10-CM | POA: Diagnosis not present

## 2016-06-17 DIAGNOSIS — M79675 Pain in left toe(s): Secondary | ICD-10-CM | POA: Diagnosis not present

## 2016-06-17 DIAGNOSIS — M79674 Pain in right toe(s): Secondary | ICD-10-CM | POA: Diagnosis not present

## 2016-06-17 DIAGNOSIS — E119 Type 2 diabetes mellitus without complications: Secondary | ICD-10-CM | POA: Diagnosis not present

## 2016-06-17 NOTE — Patient Instructions (Signed)

## 2016-06-17 NOTE — Progress Notes (Signed)
Patient ID: Angela Pugh, female   DOB: [birth year], 71 y.o.   MRN: [id number]    Subjective: This patient presents for scheduled visit complaining of uncomfortable toenails walking wearing shoes and requests toenail debridement. Also, patient is complaining of multiple painful plantar calluses on the right and left feet and requests debridement  Patient is not taking any medication currently for diabetes  Objective: Orientated 3 DP and PT pulses 2/4 bilaterally Reflex immediate bilaterally Sensation to 10 g monofilament wire intact 5/5 bilaterally Vibratory sensation reactive bilaterally Ankle reflex equal reactive bilaterally HAV bilaterally No open skin lesions bilaterally The toenails elongated, brittle, deformed, discolored tender to direct palpation 6-10 Nucleated plantar keratoses third MPJ bilaterally and hallux  Assessment: Symptomatic onychomycoses 6-10 Porokeratosis 3 History of diabetes not currently taking medication and is diet-controlled Diabetic without complications  Plan: Debridement toenails 6-10 mechanically and electrically without any bleeding Debride porokeratosis 3 without any bleeding and apply salinocaine   Reappoint 3 months

## 2016-09-15 ENCOUNTER — Ambulatory Visit: Payer: Medicare Other | Admitting: Podiatry

## 2016-09-16 ENCOUNTER — Ambulatory Visit (INDEPENDENT_AMBULATORY_CARE_PROVIDER_SITE_OTHER): Payer: Medicare Other | Admitting: Podiatry

## 2016-09-16 DIAGNOSIS — M79675 Pain in left toe(s): Secondary | ICD-10-CM | POA: Diagnosis not present

## 2016-09-16 DIAGNOSIS — E119 Type 2 diabetes mellitus without complications: Secondary | ICD-10-CM | POA: Diagnosis not present

## 2016-09-16 DIAGNOSIS — B351 Tinea unguium: Secondary | ICD-10-CM | POA: Diagnosis not present

## 2016-09-16 DIAGNOSIS — Q828 Other specified congenital malformations of skin: Secondary | ICD-10-CM | POA: Diagnosis not present

## 2016-09-16 DIAGNOSIS — M79674 Pain in right toe(s): Secondary | ICD-10-CM

## 2016-09-16 NOTE — Patient Instructions (Signed)

## 2016-09-17 NOTE — Progress Notes (Signed)
Patient ID: Angela Pugh, female   DOB: [birth year], 71 y.o.   MRN: [id number]     Subjective: This patient presents for scheduled visit complaining of uncomfortable toenails walking wearing shoes and requests toenail debridement. Also, patient is complaining of multiple painful plantar calluses on the right and left feet and requests debridement  Patient is not taking any medication currently for diabetes  Objective: Orientated 3 DP and PT pulses 2/4 bilaterally Reflex immediate bilaterally Sensation to 10 g monofilament wire intact 5/5 bilaterally Vibratory sensation reactive bilaterally Ankle reflex equal reactive bilaterally HAV bilaterally No open skin lesions bilaterally The toenails elongated, brittle, deformed, discolored tender to direct palpation 6-10 Nucleated plantar keratoses third MPJ bilaterally and hallux  Assessment: Symptomatic onychomycoses 6-10 Porokeratosis 3 History of diabetes not currently taking medication and is diet-controlled Diabetic without complications  Plan: Debridement toenails 6-10 mechanically and electrically without any bleeding Debride porokeratosis 3 without any bleeding and apply salinocaine   Reappoint 3 months

## 2016-11-11 ENCOUNTER — Encounter: Payer: Self-pay | Admitting: Family Medicine

## 2016-12-23 ENCOUNTER — Ambulatory Visit (INDEPENDENT_AMBULATORY_CARE_PROVIDER_SITE_OTHER): Payer: Medicare Other | Admitting: Podiatry

## 2016-12-23 DIAGNOSIS — Q828 Other specified congenital malformations of skin: Secondary | ICD-10-CM | POA: Diagnosis not present

## 2016-12-23 DIAGNOSIS — M79675 Pain in left toe(s): Secondary | ICD-10-CM

## 2016-12-23 DIAGNOSIS — M79674 Pain in right toe(s): Secondary | ICD-10-CM

## 2016-12-23 DIAGNOSIS — B351 Tinea unguium: Secondary | ICD-10-CM

## 2016-12-23 DIAGNOSIS — E119 Type 2 diabetes mellitus without complications: Secondary | ICD-10-CM | POA: Diagnosis not present

## 2016-12-24 ENCOUNTER — Encounter: Payer: Self-pay | Admitting: Podiatry

## 2016-12-24 NOTE — Patient Instructions (Signed)

## 2016-12-24 NOTE — Progress Notes (Signed)
Patient ID: Angela Pugh, female   DOB: 09-06-1945, 71 y.o.   MRN: [id number]    Subjective: This patient presents for scheduled visit complaining of uncomfortable toenails walking wearing shoes and requests toenail debridement. Also, patient is complaining of multiple painful plantar calluses on the right and left feet and requests debridement  Patient is not taking any medication currently for diabetes  Objective: Orientated 3 DP and PT pulses 2/4 bilaterally Reflex immediate bilaterally Sensation to 10 g monofilament wire intact 5/5 bilaterally Vibratory sensation reactive bilaterally Ankle reflex equal reactive bilaterally HAV bilaterally No open skin lesions bilaterally The toenails elongated, brittle, deformed, discolored tender to direct palpation 6-10 Nucleated plantar keratoses third MPJ bilaterally and hallux  Assessment: Symptomatic onychomycoses 6-10 Porokeratosis 3 History of diabetes not currently taking medication and is diet-controlled Diabetic without complications  Plan: Debridement toenails 6-10 mechanically and electrically without any bleeding Debride porokeratosis 3 without any bleeding and apply salinocaine   Reappoint 3 months

## 2017-04-12 ENCOUNTER — Encounter: Payer: Self-pay | Admitting: Podiatry

## 2017-04-12 ENCOUNTER — Ambulatory Visit: Payer: Medicare Other | Admitting: Podiatry

## 2017-04-12 DIAGNOSIS — M79674 Pain in right toe(s): Secondary | ICD-10-CM

## 2017-04-12 DIAGNOSIS — E119 Type 2 diabetes mellitus without complications: Secondary | ICD-10-CM | POA: Diagnosis not present

## 2017-04-12 DIAGNOSIS — Q828 Other specified congenital malformations of skin: Secondary | ICD-10-CM

## 2017-04-12 DIAGNOSIS — M79675 Pain in left toe(s): Secondary | ICD-10-CM | POA: Diagnosis not present

## 2017-04-12 DIAGNOSIS — B351 Tinea unguium: Secondary | ICD-10-CM | POA: Diagnosis not present

## 2017-04-12 NOTE — Patient Instructions (Signed)

## 2017-04-12 NOTE — Progress Notes (Signed)
Patient ID: Angela Pugh, female   DOB: [birth year], 71 y.o.   MRN: [id number]    Subjective: This patient presents for scheduled visit complaining of uncomfortable toenails walking wearing shoes and requests toenail debridement. Also, patient is complaining of multiple painful plantar calluses on the right and left feet and requests debridement  Patient is not taking any medication currently for diabetes  Objective: Orientated 3 DP and PT pulses 2/4 bilaterally Reflex immediate bilaterally Sensation to 10 g monofilament wire intact 5/5 bilaterally Vibratory sensation reactive bilaterally Ankle reflex equal reactive bilaterally HAV bilaterally No open skin lesions bilaterally The toenails elongated, brittle, deformed, discolored tender to direct palpation 6-10 Nucleated plantar keratoses third MPJ bilaterally and hallux  Assessment: Symptomatic onychomycoses 6-10 Porokeratosis 3 History of diabetes not currently taking medication and is diet-controlled Diabetic without complications  Plan: Debridement toenails 6-10 mechanically and electrically without any bleeding Debride porokeratosis 4 without any bleeding and apply salinocaine   Reappoint 3 months

## 2017-07-19 ENCOUNTER — Ambulatory Visit: Payer: Medicare Other | Admitting: Podiatry

## 2017-07-29 ENCOUNTER — Ambulatory Visit: Payer: Medicare Other | Admitting: Podiatry

## 2017-07-30 ENCOUNTER — Ambulatory Visit: Payer: Medicare Other | Admitting: Podiatry

## 2017-07-30 DIAGNOSIS — M79676 Pain in unspecified toe(s): Secondary | ICD-10-CM | POA: Diagnosis not present

## 2017-07-30 DIAGNOSIS — B351 Tinea unguium: Secondary | ICD-10-CM

## 2017-07-30 DIAGNOSIS — M79609 Pain in unspecified limb: Principal | ICD-10-CM

## 2017-08-14 NOTE — Progress Notes (Signed)
  Subjective:  Patient ID: Angela Pugh, female    DOB: [birth year],  MRN: [id number]  Chief Complaint  Patient presents with  . Nail Problem    3 month debride   72 y.o. female returns for the above complaint.  Reports painful toenails that she cannot care for herself.  States that she has pain when she wears her shoes  Objective:  There were no vitals filed for this visit. General AA&O x3. Normal mood and affect.  Vascular Pedal pulses palpable.  Neurologic Epicritic sensation grossly intact.  Dermatologic No open lesions. Skin normal texture and turgor. Toenails x 10 elongated, thickened, dystrophic.  Orthopedic: Pain to palpation about the toenails.   Assessment & Plan:  Patient was evaluated and treated and all questions answered.  Onychomycosis with pain  -Nails palliatively debrided as below. -Educated on self-care  Procedure: Nail Debridement Rationale: pain  Type of Debridement: manual, sharp debridement. Instrumentation: Nail nipper, rotary burr. Number of Nails: 10     No follow-ups on file.

## 2017-09-02 LAB — HM DIABETES EYE EXAM

## 2017-09-03 ENCOUNTER — Encounter: Payer: Self-pay | Admitting: Family Medicine

## 2017-09-10 ENCOUNTER — Encounter: Payer: Self-pay | Admitting: Family Medicine

## 2017-09-22 ENCOUNTER — Encounter: Payer: Self-pay | Admitting: Family Medicine

## 2017-09-30 ENCOUNTER — Encounter: Payer: Self-pay | Admitting: Family Medicine

## 2017-10-18 ENCOUNTER — Ambulatory Visit (INDEPENDENT_AMBULATORY_CARE_PROVIDER_SITE_OTHER): Payer: Medicare Other | Admitting: Family Medicine

## 2017-10-18 ENCOUNTER — Encounter: Payer: Self-pay | Admitting: Family Medicine

## 2017-10-18 VITALS — BP 140/82 | HR 55 | Temp 97.9°F | Ht 69.0 in | Wt 170.0 lb

## 2017-10-18 DIAGNOSIS — I10 Essential (primary) hypertension: Secondary | ICD-10-CM

## 2017-10-18 DIAGNOSIS — E785 Hyperlipidemia, unspecified: Secondary | ICD-10-CM | POA: Diagnosis not present

## 2017-10-18 DIAGNOSIS — M1990 Unspecified osteoarthritis, unspecified site: Secondary | ICD-10-CM | POA: Diagnosis not present

## 2017-10-18 DIAGNOSIS — R7303 Prediabetes: Secondary | ICD-10-CM

## 2017-10-18 DIAGNOSIS — Z7689 Persons encountering health services in other specified circumstances: Secondary | ICD-10-CM

## 2017-10-18 DIAGNOSIS — J302 Other seasonal allergic rhinitis: Secondary | ICD-10-CM

## 2017-10-18 NOTE — Progress Notes (Signed)
Patient presents to clinic today to establish care.  SUBJECTIVE: PMH: Pt is a 72 yo female with pmh sig for HTN, HLD, prediabetes, arthritis, seasonal allergies.  Patient also mentions she was undergoing testing concerning her heart but was unsure of why.  Patient was previously seen at [facility] by Dr. Montez Morita.  Seasonal allergies: -Patient uses Flonase as needed -Patient states she does not like to take medication  Arthritis: Patient endorses joint pain in her knees, back, arms -Patient has not taking anything for her symptoms  Prediabetes: -Patient endorses being told she was prediabetic -She currently controls her blood sugar with diet and exercise -Patient is not checking FS BS at home.  HTN: -Patient currently taking Norvasc 5 mg and losartan-hydrochlorothiazide 100-25 mg daily -Patient may check BP at work.  States it is typically controlled but occasionally endorses lows 98/67 -Patient drinks 5-6 bottles of water per day, 3 glasses of tea sometimes week, and Gatorade.  HLD: -Patient not taking any medication at this time -In the past patient atorvastatin caused nausea.  Allergies: Azithromycin-hives and nausea Tylenol-hives and nausea  Past surgical history: Back surgery x2 Hysterectomy 2/2 heavy bleeding  Social history: Patient is widowed.  Patient has 2 children a boy and girl.  She is currently employed as a Quarry manager.  Patient endorses rare alcohol use.  Patient is a former smoker, quit over 20 years ago.  Patient denies drug use.  Family medical history Mom-Deceased, uterine cancer Dad-? Sister-Faith, alive Brother-Henry, alive Brother-James, alive Brother-Adam, alive   Health Maintenance: Immunizations --TB skin test 2019 Colonoscopy --2017? Mammogram --2017 PAP --unsure Bone Density --    Past Medical History:  Diagnosis Date  . Arthritis    back, knees, shoulder, hands , injections in pelvis, for bone spurs (05/04/2016)  . Chronic back  pain   . Diverticulosis   . History of kidney stones   . HLD (hyperlipidemia)   . Hypertension   . Migraine    hx (05/04/2016)  . Peripheral neuropathy   . Radiculopathy   . Sinus complaint   . Sinus headache    occasional (05/04/2016)  . Type II diabetes mellitus (HCC)    diet controlled, no meds now, was on insulin & po treatment at one time    . Vertigo     Past Surgical History:  Procedure Laterality Date  . ANTERIOR CERVICAL DECOMP/DISCECTOMY FUSION N/A 04/17/2016   Procedure: ANTERIOR CERVICAL DECOMPRESSION/DISCECTOMY FUSION CERVICAL THREE - CERVICAL FOUR, POSTERIOR CERVICAL DISCECTOMY CERVICAL SEVEN -THORASIC ONE LEFT;  Surgeon: Ashok Pall, MD;  Location: [facility];  Service: Neurosurgery;  Laterality: N/A;  Anterior/Posterior  . ANTERIOR FUSION CERVICAL SPINE  2000   Dr. Carloyn Manner; "for bone spurs; put hardware in too"  . BACK SURGERY    . LUMBAR LAMINECTOMY Left 06/30/2013   Procedure: Left L3-4 Extraforaminal approach to excise far lateral herniated nucleus pulposus;  Surgeon: Jessy Oto, MD;  Location: [facility];  Service: Orthopedics;  Laterality: Left;  . TUBAL LIGATION    . VAGINAL HYSTERECTOMY      Current Outpatient Medications on File Prior to Visit  Medication Sig Dispense Refill  . amLODipine (NORVASC) 5 MG tablet Take 5 mg by mouth at bedtime.     Marland Kitchen aspirin 81 MG EC tablet Take 81 mg by mouth daily. Swallow whole.    . losartan (COZAAR) 100 MG tablet Take 1 tablet (100 mg total) by mouth daily. (Patient taking differently: Take 100 mg by mouth at bedtime. )  30 tablet 0  . Multiple Vitamins-Minerals (CENTRUM ADULTS PO) Take by mouth.    . triamcinolone (KENALOG) 0.025 % ointment Apply 1 application topically as needed.     No current facility-administered medications on file prior to visit.     Allergies  Allergen Reactions  . Acetaminophen Hives and Nausea Only  . Atorvastatin Nausea Only    weakness  . Azithromycin Hives and Nausea And Vomiting    Family  History  Problem Relation Age of Onset  . Uterine cancer Mother   . Diabetes type II Sister   . Diabetes Mellitus II Brother     Social History   Socioeconomic History  . Marital status: Single    Spouse name: Not on file  . Number of children: Not on file  . Years of education: Not on file  . Highest education level: Not on file  Occupational History  . Not on file  Social Needs  . Financial resource strain: Not on file  . Food insecurity:    Worry: Not on file    Inability: Not on file  . Transportation needs:    Medical: Not on file    Non-medical: Not on file  Tobacco Use  . Smoking status: Former Smoker    Packs/day: 0.50    Years: 20.00    Pack years: 10.00    Types: Cigarettes    Last attempt to quit: 06/30/1982    Years since quitting: 35.3  . Smokeless tobacco: Never Used  Substance and Sexual Activity  . Alcohol use: Yes    Comment: 05/04/2016 "might have a drink a couple days/year; might not"  . Drug use: No  . Sexual activity: Not Currently  Lifestyle  . Physical activity:    Days per week: Not on file    Minutes per session: Not on file  . Stress: Not on file  Relationships  . Social connections:    Talks on phone: Not on file    Gets together: Not on file    Attends religious service: Not on file    Active member of club or organization: Not on file    Attends meetings of clubs or organizations: Not on file    Relationship status: Not on file  . Intimate partner violence:    Fear of current or ex partner: Not on file    Emotionally abused: Not on file    Physically abused: Not on file    Forced sexual activity: Not on file  Other Topics Concern  . Not on file  Social History Narrative  . Not on file    ROS General: Denies fever, chills, night sweats, changes in weight, changes in appetite HEENT: Denies headaches, ear pain, changes in vision, rhinorrhea, sore throat CV: Denies CP, palpitations, SOB, orthopnea Pulm: Denies SOB, cough,  wheezing GI: Denies abdominal pain, nausea, vomiting, diarrhea, constipation GU: Denies dysuria, hematuria, frequency, vaginal discharge Msk: Denies muscle cramps  + joint pains Neuro: Denies weakness, numbness, tingling Skin: Denies rashes, bruising Psych: Denies depression, anxiety, hallucinations  BP 140/82 (BP Location: Left Arm, Patient Position: Sitting, Cuff Size: Normal)   Pulse (!) 55   Temp 97.9 F (36.6 C) (Oral)   Ht 5\' 9"  (1.753 m)   Wt 170 lb (77.1 kg)   SpO2 98%   BMI 25.10 kg/m   Physical Exam Gen. Pleasant, well developed, well-nourished, in NAD HEENT - [place]/AT, PERRL, no scleral icterus, no nasal drainage, pharynx without erythema or exudate.  TMs normal  bilaterally.  No cervical lymphadenopathy. Neck: No JVD, no thyromegaly, no carotid bruits Lungs: no use of accessory muscles, CTAB, no wheezes, rales or rhonchi Cardiovascular: RRR, No r/g/m, no peripheral edema Abdomen: BS present, soft, nontender, nondistended Neuro:  A&Ox3, CN II-XII intact, normal gait Skin:  Warm, dry, intact, no lesions Psych: normal affect, mood appropriate  No results found for this or any previous visit (from the past 2160 hour(s)).  Assessment/Plan: Seasonal allergies -Continue Flonase as needed  Arthritis -Stable -Consider water aerobics for increased symptoms.  Pre-diabetes -Continue lifestyle modifications encouraged -We will obtain hemoglobin A1c at next visit it has not been done recently.  Essential hypertension -Controlled -Continue Norvasc 5 mg, losartan-HCTZ 100-25 mg daily -Patient encouraged to increase physical activity and decrease sodium intake  Hyperlipidemia, unspecified hyperlipidemia type -Continue to decrease intake of saturated fats -We will obtain labs from previous office to see when the last time cholesterol was checked. -If need be we will obtain lipid panel  Encounter to establish care -We reviewed the PMH, PSH, FH, SH, Meds and Allergies. -We  provided refills for any medications we will prescribe as needed. -We addressed current concerns per orders and patient instructions. -We have asked for records for pertinent exams, studies, vaccines and notes from previous providers. -We have advised patient to follow up per instructions below.  Follow-up in 1 month, sooner if needed  Grier Mitts, MD

## 2017-10-18 NOTE — Patient Instructions (Signed)
Preventing Type 2 Diabetes Mellitus Type 2 diabetes (type 2 diabetes mellitus) is a long-term (chronic) disease that affects blood sugar (glucose) levels. Normally, a hormone called insulin allows glucose to enter cells in the body. The cells use glucose for energy. In type 2 diabetes, one or both of these problems may be present:  The body does not make enough insulin.  The body does not respond properly to insulin that it makes (insulin resistance).  Insulin resistance or lack of insulin causes excess glucose to build up in the blood instead of going into cells. As a result, high blood glucose (hyperglycemia) develops, which can cause many complications. Being overweight or obese and having an inactive (sedentary) lifestyle can increase your risk for diabetes. Type 2 diabetes can be delayed or prevented by making certain nutrition and lifestyle changes. What nutrition changes can be made?  Eat healthy meals and snacks regularly. Keep a healthy snack with you for when you get hungry between meals, such as fruit or a handful of nuts.  Eat lean meats and proteins that are low in saturated fats, such as chicken, fish, egg whites, and beans. Avoid processed meats.  Eat plenty of fruits and vegetables and plenty of grains that have not been processed (whole grains). It is recommended that you eat: ? 1?2 cups of fruit every day. ? 2?3 cups of vegetables every day. ? 6?8 oz of whole grains every day, such as oats, whole wheat, bulgur, brown rice, quinoa, and millet.  Eat low-fat dairy products, such as milk, yogurt, and cheese.  Eat foods that contain healthy fats, such as nuts, avocado, olive oil, and canola oil.  Drink water throughout the day. Avoid drinks that contain added sugar, such as soda or sweet tea.  Follow instructions from your health care provider about specific eating or drinking restrictions.  Control how much food you eat at a time (portion size). ? Check food labels to find  out the serving sizes of foods. ? Use a kitchen scale to weigh amounts of foods.  Saute or steam food instead of frying it. Cook with water or broth instead of oils or butter.  Limit your intake of: ? Salt (sodium). Have no more than 1 tsp (2,400 mg) of sodium a day. If you have heart disease or high blood pressure, have less than ? tsp (1,500 mg) of sodium a day. ? Saturated fat. This is fat that is solid at room temperature, such as butter or fat on meat. What lifestyle changes can be made?  Activity  Do moderate-intensity physical activity for at least 30 minutes on at least 5 days of the week, or as much as told by your health care provider.  Ask your health care provider what activities are safe for you. A mix of physical activities may be best, such as walking, swimming, cycling, and strength training.  Try to add physical activity into your day. For example: ? Park in spots that are farther away than usual, so that you walk more. For example, park in a far corner of the parking lot when you go to the office or the grocery store. ? Take a walk during your lunch break. ? Use stairs instead of elevators or escalators. Weight Loss  Lose weight as directed. Your health care provider can determine how much weight loss is best for you and can help you lose weight safely.  If you are overweight or obese, you may be instructed to lose at least 5?7 %   of your body weight. Alcohol and Tobacco   Limit alcohol intake to no more than 1 drink a day for nonpregnant women and 2 drinks a day for men. One drink equals 12 oz of beer, 5 oz of wine, or 1 oz of hard liquor.  Do not use any tobacco products, such as cigarettes, chewing tobacco, and e-cigarettes. If you need help quitting, ask your health care provider. Work With [facility] Provider  Have your blood glucose tested regularly, as told by your health care provider.  Discuss your risk factors and how you can reduce your risk for  diabetes.  Get screening tests as told by your health care provider. You may have screening tests regularly, especially if you have certain risk factors for type 2 diabetes.  Make an appointment with a diet and nutrition specialist (registered dietitian). A registered dietitian can help you make a healthy eating plan and can help you understand portion sizes and food labels. Why are these changes important?  It is possible to prevent or delay type 2 diabetes and related health problems by making lifestyle and nutrition changes.  It can be difficult to recognize signs of type 2 diabetes. The best way to avoid possible damage to your body is to take actions to prevent the disease before you develop symptoms. What can happen if changes are not made?  Your blood glucose levels may keep increasing. Having high blood glucose for a long time is dangerous. Too much glucose in your blood can damage your blood vessels, heart, kidneys, nerves, and eyes.  You may develop prediabetes or type 2 diabetes. Type 2 diabetes can lead to many chronic health problems and complications, such as: ? Heart disease. ? Stroke. ? Blindness. ? Kidney disease. ? Depression. ? Poor circulation in the feet and legs, which could lead to surgical removal (amputation) in severe cases. Where to find support:  Ask your health care provider to recommend a registered dietitian, diabetes educator, or weight loss program.  Look for local or online weight loss groups.  Join a gym, fitness club, or outdoor activity group, such as a walking club. Where to find more information: To learn more about diabetes and diabetes prevention, visit:  American Diabetes Association (ADA): www.diabetes.[employer] of Diabetes and Digestive and Kidney Diseases: FindSpin.nl  To learn more about healthy eating, visit:  The U.S. Department of Agriculture Scientist, research (physical sciences)), Choose My Plate:  http://wiley-williams.com/  Office of Disease Prevention and Health Promotion (ODPHP), Dietary Guidelines: SurferLive.at  Summary  You can reduce your risk for type 2 diabetes by increasing your physical activity, eating healthy foods, and losing weight as directed.  Talk with your health care provider about your risk for type 2 diabetes. Ask about any blood tests or screening tests that you need to have. This information is not intended to replace advice given to you by your health care provider. Make sure you discuss any questions you have with your health care provider. Document Released: 08/26/2015 Document Revised: 10/10/2015 Document Reviewed: 06/25/2015 Elsevier Interactive Patient Education  2018 [employer].  Managing Your Hypertension Hypertension is commonly called high blood pressure. This is when the force of your blood pressing against the walls of your arteries is too strong. Arteries are blood vessels that carry blood from your heart throughout your body. Hypertension forces the heart to work harder to pump blood, and may cause the arteries to become narrow or stiff. Having untreated or uncontrolled hypertension can cause heart attack, stroke,  kidney disease, and other problems. What are blood pressure readings? A blood pressure reading consists of a higher number over a lower number. Ideally, your blood pressure should be below 120/80. The first ("top") number is called the systolic pressure. It is a measure of the pressure in your arteries as your heart beats. The second ("bottom") number is called the diastolic pressure. It is a measure of the pressure in your arteries as the heart relaxes. What does my blood pressure reading mean? Blood pressure is classified into four stages. Based on your blood pressure reading, your health care provider may use the following stages to determine what type of treatment you need, if any. Systolic pressure and  diastolic pressure are measured in a unit called mm Hg. Normal  Systolic pressure: below 259.  Diastolic pressure: below 80. Elevated  Systolic pressure: 563-875.  Diastolic pressure: below 80. Hypertension stage 1  Systolic pressure: 643-329.  Diastolic pressure: 51-88. Hypertension stage 2  Systolic pressure: 416 or above.  Diastolic pressure: 90 or above. What health risks are associated with hypertension? Managing your hypertension is an important responsibility. Uncontrolled hypertension can lead to:  A heart attack.  A stroke.  A weakened blood vessel (aneurysm).  Heart failure.  Kidney damage.  Eye damage.  Metabolic syndrome.  Memory and concentration problems.  What changes can I make to manage my hypertension? Hypertension can be managed by making lifestyle changes and possibly by taking medicines. Your health care provider will help you make a plan to bring your blood pressure within a normal range. Eating and drinking  Eat a diet that is high in fiber and potassium, and low in salt (sodium), added sugar, and fat. An example eating plan is called the DASH (Dietary Approaches to Stop Hypertension) diet. To eat this way: ? Eat plenty of fresh fruits and vegetables. Try to fill half of your plate at each meal with fruits and vegetables. ? Eat whole grains, such as whole wheat pasta, brown rice, or whole grain bread. Fill about one quarter of your plate with whole grains. ? Eat low-fat diary products. ? Avoid fatty cuts of meat, processed or cured meats, and poultry with skin. Fill about one quarter of your plate with lean proteins such as fish, chicken without skin, beans, eggs, and tofu. ? Avoid premade and processed foods. These tend to be higher in sodium, added sugar, and fat.  Reduce your daily sodium intake. Most people with hypertension should eat less than 1,500 mg of sodium a day.  Limit alcohol intake to no more than 1 drink a day for nonpregnant  women and 2 drinks a day for men. One drink equals 12 oz of beer, 5 oz of wine, or 1 oz of hard liquor. Lifestyle  Work with your health care provider to maintain a healthy body weight, or to lose weight. Ask what an ideal weight is for you.  Get at least 30 minutes of exercise that causes your heart to beat faster (aerobic exercise) most days of the week. Activities may include walking, swimming, or biking.  Include exercise to strengthen your muscles (resistance exercise), such as weight lifting, as part of your weekly exercise routine. Try to do these types of exercises for 30 minutes at least 3 days a week.  Do not use any products that contain nicotine or tobacco, such as cigarettes and e-cigarettes. If you need help quitting, ask your health care provider.  Control any long-term (chronic) conditions you have, such as high  cholesterol or diabetes. Monitoring  Monitor your blood pressure at home as told by your health care provider. Your personal target blood pressure may vary depending on your medical conditions, your age, and other factors.  Have your blood pressure checked regularly, as often as told by your health care provider. Working with your health care provider  Review all the medicines you take with your health care provider because there may be side effects or interactions.  Talk with your health care provider about your diet, exercise habits, and other lifestyle factors that may be contributing to hypertension.  Visit your health care provider regularly. Your health care provider can help you create and adjust your plan for managing hypertension. Will I need medicine to control my blood pressure? Your health care provider may prescribe medicine if lifestyle changes are not enough to get your blood pressure under control, and if:  Your systolic blood pressure is 130 or higher.  Your diastolic blood pressure is 80 or higher.  Take medicines only as told by your health  care provider. Follow the directions carefully. Blood pressure medicines must be taken as prescribed. The medicine does not work as well when you skip doses. Skipping doses also puts you at risk for problems. Contact a health care provider if:  You think you are having a reaction to medicines you have taken.  You have repeated (recurrent) headaches.  You feel dizzy.  You have swelling in your ankles.  You have trouble with your vision. Get help right away if:  You develop a severe headache or confusion.  You have unusual weakness or numbness, or you feel faint.  You have severe pain in your chest or abdomen.  You vomit repeatedly.  You have trouble breathing. Summary  Hypertension is when the force of blood pumping through your arteries is too strong. If this condition is not controlled, it may put you at risk for serious complications.  Your personal target blood pressure may vary depending on your medical conditions, your age, and other factors. For most people, a normal blood pressure is less than 120/80.  Hypertension is managed by lifestyle changes, medicines, or both. Lifestyle changes include weight loss, eating a healthy, low-sodium diet, exercising more, and limiting alcohol. This information is not intended to replace advice given to you by your health care provider. Make sure you discuss any questions you have with your health care provider. Document Released: 01/27/2012 Document Revised: 04/01/2016 Document Reviewed: 04/01/2016 Elsevier Interactive Patient Education  2018 [place] Eating Plan DASH stands for "Dietary Approaches to Stop Hypertension." The DASH eating plan is a healthy eating plan that has been shown to reduce high blood pressure (hypertension). It may also reduce your risk for type 2 diabetes, heart disease, and stroke. The DASH eating plan may also help with weight loss. What are tips for following this plan? General guidelines  Avoid  eating more than 2,300 mg (milligrams) of salt (sodium) a day. If you have hypertension, you may need to reduce your sodium intake to 1,500 mg a day.  Limit alcohol intake to no more than 1 drink a day for nonpregnant women and 2 drinks a day for men. One drink equals 12 oz of beer, 5 oz of wine, or 1 oz of hard liquor.  Work with your health care provider to maintain a healthy body weight or to lose weight. Ask what an ideal weight is for you.  Get at least 30 minutes of exercise that causes  your heart to beat faster (aerobic exercise) most days of the week. Activities may include walking, swimming, or biking.  Work with your health care provider or diet and nutrition specialist (dietitian) to adjust your eating plan to your individual calorie needs. Reading food labels  Check food labels for the amount of sodium per serving. Choose foods with less than 5 percent of the Daily Value of sodium. Generally, foods with less than 300 mg of sodium per serving fit into this eating plan.  To find whole grains, look for the word "whole" as the first word in the ingredient list. Shopping  Buy products labeled as "low-sodium" or "no salt added."  Buy fresh foods. Avoid canned foods and premade or frozen meals. Cooking  Avoid adding salt when cooking. Use salt-free seasonings or herbs instead of table salt or sea salt. Check with your health care provider or pharmacist before using salt substitutes.  Do not fry foods. Cook foods using healthy methods such as baking, boiling, grilling, and broiling instead.  Cook with heart-healthy oils, such as olive, canola, soybean, or sunflower oil. Meal planning   Eat a balanced diet that includes: ? 5 or more servings of fruits and vegetables each day. At each meal, try to fill half of your plate with fruits and vegetables. ? Up to 6-8 servings of whole grains each day. ? Less than 6 oz of lean meat, poultry, or fish each day. A 3-oz serving of meat is  about the same size as a deck of cards. One egg equals 1 oz. ? 2 servings of low-fat dairy each day. ? A serving of nuts, seeds, or beans 5 times each week. ? Heart-healthy fats. Healthy fats called Omega-3 fatty acids are found in foods such as flaxseeds and coldwater fish, like sardines, salmon, and mackerel.  Limit how much you eat of the following: ? Canned or prepackaged foods. ? Food that is high in trans fat, such as fried foods. ? Food that is high in saturated fat, such as fatty meat. ? Sweets, desserts, sugary drinks, and other foods with added sugar. ? Full-fat dairy products.  Do not salt foods before eating.  Try to eat at least 2 vegetarian meals each week.  Eat more home-cooked food and less restaurant, buffet, and fast food.  When eating at a restaurant, ask that your food be prepared with less salt or no salt, if possible. What foods are recommended? The items listed may not be a complete list. Talk with your dietitian about what dietary choices are best for you. Grains Whole-grain or whole-wheat bread. Whole-grain or whole-wheat pasta. Brown rice. [place]. Bulgur. Whole-grain and low-sodium cereals. Pita bread. Low-fat, low-sodium crackers. Whole-wheat flour tortillas. Vegetables Fresh or frozen vegetables (raw, steamed, roasted, or grilled). Low-sodium or reduced-sodium tomato and vegetable juice. Low-sodium or reduced-sodium tomato sauce and tomato paste. Low-sodium or reduced-sodium canned vegetables. Fruits All fresh, dried, or frozen fruit. Canned fruit in natural juice (without added sugar). Meat and other protein foods Skinless chicken or [place]. Ground chicken or [place]. Pork with fat trimmed off. Fish and seafood. Egg whites. Dried beans, peas, or lentils. Unsalted nuts, nut butters, and seeds. Unsalted canned beans. Lean cuts of beef with fat trimmed off. Low-sodium, lean deli meat. Dairy Low-fat (1%) or fat-free (skim) milk. Fat-free, low-fat, or  reduced-fat cheeses. Nonfat, low-sodium ricotta or cottage cheese. Low-fat or nonfat yogurt. Low-fat, low-sodium cheese. Fats and oils Soft margarine without trans fats. Vegetable oil. Low-fat, reduced-fat, or light mayonnaise  and salad dressings (reduced-sodium). Canola, safflower, olive, soybean, and sunflower oils. Avocado. Seasoning and other foods Herbs. Spices. Seasoning mixes without salt. Unsalted popcorn and pretzels. Fat-free sweets. What foods are not recommended? The items listed may not be a complete list. Talk with your dietitian about what dietary choices are best for you. Grains Baked goods made with fat, such as croissants, muffins, or some breads. Dry pasta or rice meal packs. Vegetables Creamed or fried vegetables. Vegetables in a cheese sauce. Regular canned vegetables (not low-sodium or reduced-sodium). Regular canned tomato sauce and paste (not low-sodium or reduced-sodium). Regular tomato and vegetable juice (not low-sodium or reduced-sodium). Angie Fava. Olives. Fruits Canned fruit in a light or heavy syrup. Fried fruit. Fruit in cream or butter sauce. Meat and other protein foods Fatty cuts of meat. Ribs. Fried meat. Berniece Salines. Sausage. Bologna and other processed lunch meats. Salami. Fatback. Hotdogs. Bratwurst. Salted nuts and seeds. Canned beans with added salt. Canned or smoked fish. Whole eggs or egg yolks. Chicken or [place] with skin. Dairy Whole or 2% milk, cream, and half-and-half. Whole or full-fat cream cheese. Whole-fat or sweetened yogurt. Full-fat cheese. Nondairy creamers. Whipped toppings. Processed cheese and cheese spreads. Fats and oils Butter. Stick margarine. Lard. Shortening. Ghee. Bacon fat. Tropical oils, such as coconut, palm kernel, or palm oil. Seasoning and other foods Salted popcorn and pretzels. Onion salt, garlic salt, seasoned salt, table salt, and sea salt. Worcestershire sauce. Tartar sauce. Barbecue sauce. Teriyaki sauce. Soy sauce, including  reduced-sodium. Steak sauce. Canned and packaged gravies. Fish sauce. Oyster sauce. Cocktail sauce. Horseradish that you find on the shelf. Ketchup. Mustard. Meat flavorings and tenderizers. Bouillon cubes. Hot sauce and Tabasco sauce. Premade or packaged marinades. Premade or packaged taco seasonings. Relishes. Regular salad dressings. Where to find more information:  National Heart, Lung, and [place]: https://wilson-eaton.com/  American Heart Association: www.heart.org Summary  The DASH eating plan is a healthy eating plan that has been shown to reduce high blood pressure (hypertension). It may also reduce your risk for type 2 diabetes, heart disease, and stroke.  With the DASH eating plan, you should limit salt (sodium) intake to 2,300 mg a day. If you have hypertension, you may need to reduce your sodium intake to 1,500 mg a day.  When on the DASH eating plan, aim to eat more fresh fruits and vegetables, whole grains, lean proteins, low-fat dairy, and heart-healthy fats.  Work with your health care provider or diet and nutrition specialist (dietitian) to adjust your eating plan to your individual calorie needs. This information is not intended to replace advice given to you by your health care provider. Make sure you discuss any questions you have with your health care provider. Document Released: 04/23/2011 Document Revised: 04/27/2016 Document Reviewed: 04/27/2016 Elsevier Interactive Patient Education  [employer].

## 2017-10-29 ENCOUNTER — Ambulatory Visit: Payer: Medicare Other | Admitting: Podiatry

## 2017-11-04 ENCOUNTER — Ambulatory Visit: Payer: Medicare Other | Admitting: Podiatry

## 2017-11-04 ENCOUNTER — Ambulatory Visit (INDEPENDENT_AMBULATORY_CARE_PROVIDER_SITE_OTHER): Payer: Medicare Other

## 2017-11-04 ENCOUNTER — Other Ambulatory Visit: Payer: Self-pay

## 2017-11-04 DIAGNOSIS — M779 Enthesopathy, unspecified: Secondary | ICD-10-CM

## 2017-11-04 DIAGNOSIS — M79609 Pain in unspecified limb: Secondary | ICD-10-CM | POA: Diagnosis not present

## 2017-11-04 DIAGNOSIS — B351 Tinea unguium: Secondary | ICD-10-CM | POA: Diagnosis not present

## 2017-11-04 DIAGNOSIS — M778 Other enthesopathies, not elsewhere classified: Secondary | ICD-10-CM

## 2017-11-07 NOTE — Progress Notes (Signed)
  Subjective:  Patient ID: Angela Pugh, female    DOB: 1945/08/17,  MRN: [id number]  Chief Complaint  Patient presents with  . Nail Problem    3 month debride  . Foot Pain    bilateral foot pain  . Callouses    new callouses bilaterally   72 y.o. female returns for the above complaint.  Reports painful calluses to both feet.  Objective:  There were no vitals filed for this visit. General AA&O x3. Normal mood and affect.  Vascular Pedal pulses palpable.  Neurologic Epicritic sensation grossly intact.  Dermatologic No open lesions. Skin normal texture and turgor. Toenails x 10 elongated, thickened, dystrophic.  Orthopedic: Pain to palpation about the toenails. Pain palpation sub-met 1, 2 bilateral   Assessment & Plan:  Patient was evaluated and treated and all questions answered.  Onychomycosis with pain  -Nails palliatively debrided as below. -Educated on self-care  Procedure: Nail Debridement Rationale: pain  Type of Debridement: manual, sharp debridement. Instrumentation: Nail nipper, rotary burr. Number of Nails: 10  Capsulitis -Discussed that the calluses reforming metatarsal deformity.  Would consider possible metatarsal osteotomies.  We discussed in future the patient.  Return in about 6 weeks (around 12/16/2017).

## 2017-11-17 ENCOUNTER — Ambulatory Visit (INDEPENDENT_AMBULATORY_CARE_PROVIDER_SITE_OTHER): Payer: Medicare Other | Admitting: Family Medicine

## 2017-11-17 ENCOUNTER — Encounter: Payer: Self-pay | Admitting: Family Medicine

## 2017-11-17 VITALS — BP 138/82 | HR 60 | Temp 98.4°F | Wt 170.0 lb

## 2017-11-17 DIAGNOSIS — I10 Essential (primary) hypertension: Secondary | ICD-10-CM | POA: Diagnosis not present

## 2017-11-17 MED ORDER — OLMESARTAN MEDOXOMIL 5 MG PO TABS: 5.0000 mg | ORAL_TABLET | Freq: Every day | ORAL | 3 refills | Status: DC

## 2017-11-17 NOTE — Progress Notes (Signed)
Subjective:    Patient ID: Angela Pugh, female    DOB: [birth year], 72 y.o.   MRN: [id number]  No chief complaint on file.   HPI Patient was seen today for f/u on bp.  Pt is taking Norvasc 5 mg daily.   Pt stopped taking losartan-HCTZ 100-25 mg daily 2/2 repeated letters from [facility] about the medication being recalled.  Pt has been off of the med for 2-3 wks.  Pt's bp at home has been in the 120s-130s/80s.  Pt denies headaches, dizziness, shortness of breath, lower extremity edema.  Patient mentions previous provider wanted to refer her to cardiology.  Patient is unsure why.  Patient's records from outside reviewed.  ECHO from 09/22/2017 with EF 55 to 60%, mild AR, trace MR, mild pulmonary HTN.  EKG from 10/19/2017 with sinus bradycardia and left BBB.  Patient denies CP, SOB, dyspnea on exertion.  Past Medical History:  Diagnosis Date  . Arthritis    back, knees, shoulder, hands , injections in pelvis, for bone spurs (05/04/2016)  . Chronic back pain   . Diverticulosis   . History of kidney stones   . HLD (hyperlipidemia)   . Hypertension   . Migraine    hx (05/04/2016)  . Peripheral neuropathy   . Radiculopathy   . Sinus complaint   . Sinus headache    occasional (05/04/2016)  . Type II diabetes mellitus (HCC)    diet controlled, no meds now, was on insulin & po treatment at one time    . Vertigo     Allergies  Allergen Reactions  . Acetaminophen Hives and Nausea Only  . Atorvastatin Nausea Only    weakness  . Azithromycin Hives and Nausea And Vomiting    ROS General: Denies fever, chills, night sweats, changes in weight, changes in appetite HEENT: Denies headaches, ear pain, changes in vision, rhinorrhea, sore throat CV: Denies CP, palpitations, SOB, orthopnea Pulm: Denies SOB, cough, wheezing GI: Denies abdominal pain, nausea, vomiting, diarrhea, constipation GU: Denies dysuria, hematuria, frequency, vaginal discharge Msk: Denies muscle cramps, joint  pains Neuro: Denies weakness, numbness, tingling Skin: Denies rashes, bruising Psych: Denies depression, anxiety, hallucinations     Objective:    Blood pressure 138/82, pulse 60, temperature 98.4 F (36.9 C), temperature source Oral, weight 170 lb (77.1 kg), SpO2 98 %.   Gen. Pleasant, well-nourished, in no distress, normal affect  HEENT: [place]/AT, face symmetric, no scleral icterus, PERRLA, nares patent without drainage Lungs: no accessory muscle use, CTAB, no wheezes or rales Cardiovascular: RRR Neuro:  A&Ox3, CN II-XII intact, normal gait    Wt Readings from Last 3 Encounters:  11/17/17 170 lb (77.1 kg)  10/18/17 170 lb (77.1 kg)  04/17/16 178 lb (80.7 kg)    Lab Results  Component Value Date   WBC 9.7 05/04/2016   HGB 14.4 05/04/2016   HCT 42.2 05/04/2016   PLT 466 (H) 05/04/2016   GLUCOSE 168 (H) 05/04/2016   ALT 16 05/04/2016   AST 14 (L) 05/04/2016   NA 142 05/04/2016   K 3.5 05/04/2016   CL 107 05/04/2016   CREATININE 0.87 05/04/2016   BUN 12 05/04/2016   CO2 25 05/04/2016   INR 1.06 01/16/2015   HGBA1C 6.2 (H) 04/14/2016    Assessment/Plan:  Essential hypertension  -Controlled -Patient to continue taking Norvasc 5 mg daily. -Patient to check BP daily.  If elevated (greater than 140/90) she will start olmesartan 5 mg. - Plan: olmesartan (BENICAR) 5 MG tablet -Follow-up in  1 month, sooner if needed  Discussed outside results including echo, carotid Doppler, ABI, lower extremity ultrasound, EKG.  Offered referral to cardiology for left bundle branch block.  Patient wishes to wait at this time.  At next [facility] in 1 month we will obtain EKG.  Outside records to be scanned in chart  Grier Mitts, MD

## 2017-12-16 ENCOUNTER — Ambulatory Visit: Payer: Medicare Other | Admitting: Podiatry

## 2017-12-20 ENCOUNTER — Encounter: Payer: Self-pay | Admitting: Family Medicine

## 2017-12-20 ENCOUNTER — Ambulatory Visit (INDEPENDENT_AMBULATORY_CARE_PROVIDER_SITE_OTHER): Payer: Medicare Other | Admitting: Family Medicine

## 2017-12-20 VITALS — BP 132/86 | HR 60 | Temp 98.0°F | Wt 172.0 lb

## 2017-12-20 DIAGNOSIS — I10 Essential (primary) hypertension: Secondary | ICD-10-CM | POA: Diagnosis not present

## 2017-12-20 DIAGNOSIS — I447 Left bundle-branch block, unspecified: Secondary | ICD-10-CM

## 2017-12-20 DIAGNOSIS — R079 Chest pain, unspecified: Secondary | ICD-10-CM | POA: Diagnosis not present

## 2017-12-20 LAB — CBC
HCT: 41.9 % (ref 36.0–46.0)
Hemoglobin: 14.1 g/dL (ref 12.0–15.0)
MCHC: 33.7 g/dL (ref 30.0–36.0)
MCV: 95.1 fl (ref 78.0–100.0)
Platelets: 306 10*3/uL (ref 150.0–400.0)
RBC: 4.4 Mil/uL (ref 3.87–5.11)
RDW: 12.9 % (ref 11.5–15.5)
WBC: 4.8 10*3/uL (ref 4.0–10.5)

## 2017-12-20 LAB — BASIC METABOLIC PANEL
BUN: 14 mg/dL (ref 6–23)
CALCIUM: 9.7 mg/dL (ref 8.4–10.5)
CO2: 32 mEq/L (ref 19–32)
CREATININE: 0.88 mg/dL (ref 0.40–1.20)
Chloride: 102 mEq/L (ref 96–112)
GFR: 81.39 mL/min (ref >60.00)
Glucose, Bld: 119 mg/dL — ABNORMAL HIGH (ref 70–99)
Potassium: 3.8 mEq/L (ref 3.5–5.1)
Sodium: 140 mEq/L (ref 135–145)

## 2017-12-20 LAB — TROPONIN I: TNIDX: 0.04 ug/L (ref 0.00–0.06)

## 2017-12-20 NOTE — Patient Instructions (Signed)
Left Bundle Branch Block Left bundle branch block (LBBB) is a problem with the way that electrical impulses pass through the heart (electrical conduction abnormality). The heart depends on an electrical pulse to beat normally. The electrical signal for a heartbeat starts in the upper chambers of the heart (atria) and then travels to the two lower chambers (left and rightventricles). An LBBB is a partial or complete block of the pathway that carries the signal to the left ventricle. If you have LBBB, the left side of your heart does not beat normally. LBBB may be a warning sign of heart disease. What are the causes? This condition may be caused by:  Heart disease.  Disease of the arteries in the heart (arteriosclerosis).  Stiffening or weakening of heart muscle (cardiomyopathy).  Infection of heart muscle (myocarditis).  High blood pressure (hypertension).  In some cases, the cause may not be known. What increases the risk? The following factors may make you more likely to develop this condition:  Being female.  Being 50 years of age or older.  Having heart disease.  Having had a heart attack or heart surgery.  What are the signs or symptoms? This condition may not cause any symptoms. If you do have symptoms, they may include:  Feeling dizzy or light-headed.  Fainting.  How is this diagnosed? This condition may be diagnosed based on an electrocardiogram (ECG). It is often diagnosed when an ECG is done as part of a routine physical or to help find the cause of fainting spells. You may also have imaging tests to find out more about your condition. These may include:  Chest X-rays.  Echo test (echocardiogram).  How is this treated? If you do not have symptoms or any other type of heart disease, you may not need treatment for this condition. However, you may need to see your health care provider more often because LBBB can be a warning sign of future heart problems. You may get  treatment for other heart problems or high blood pressure. If LBBB causes symptoms or other heart problems, you may need to have an electrical device (pacemaker) implanted under the skin of your chest. A pacemaker sends electrical signals to your heart to keep it beating normally. Follow these instructions at home:  Follow instructions from your health care provider about eating or drinking restrictions.  Take over-the-counter and prescription medicines only as told by your health care provider.  Return to your normal activities as told by your health care provider. Ask your health care provider what activities are safe for you.  Follow a heart-healthy diet and maintain a healthy weight. Work with a diet and nutrition specialist (dietitian) to create an eating plan that is best for you.  Get regular exercise as told by your health care provider.  Do not use any products that contain nicotine or tobacco, such as cigarettes and e-cigarettes. If you need help quitting, ask your health care provider.  Keep all follow-up visits as told by your health care provider. This is important. Contact a health care provider if:  You are light-headed or you faint. Get help right away if:  You have chest pain.  You have difficulty breathing. These symptoms may represent a serious problem that is an emergency. Do not wait to see if the symptoms will go away. Get medical help right away. Call your local emergency services (911 in the [place] Do not drive yourself to the hospital. Summary  For the heart to beat normally, an   electrical signal must travel to the lower left chamber of the heart (left ventricle). Left bundle branch block (LBBB) is a partial or complete block of the pathway that carries that signal.  This condition may not cause any symptoms. In some cases, a person may feel dizzy or light-headed or may faint.  Treatment may not be needed for LBBB if you do not have symptoms or any other type  of heart disease.  You may need to see your health care provider more often because LBBB can be a warning sign of future heart problems. This information is not intended to replace advice given to you by your health care provider. Make sure you discuss any questions you have with your health care provider. Document Released: 06/23/2016 Document Revised: 06/23/2016 Document Reviewed: 06/23/2016 Elsevier Interactive Patient Education  2018 [employer]vier Inc.  

## 2017-12-20 NOTE — Progress Notes (Signed)
Subjective:    Patient ID: Angela Pugh, female    DOB: 02-05-1946, 72 y.o.   MRN: [id number]  No chief complaint on file.   HPI Patient was seen today for follow-up and ongoing concern.  Hypertension: -Patient checking BP at home typically 120s- 130s/ 80s -Taking Norvasc 5 mg daily -Olmesartan 5 mg on medication list, however pt has not had to take this as BP has been less than 140/90.  Chest pain: -Pt endorses daily substernal chest pain x2 weeks. -Pain can occur at any time.  Patient does not notice an association with food. -Patient denies burning any chest or acid taste in mouth. -May take 1-2 gas X tablets for relief. -In the past patient with mild pulmonary hypertension, mild AR, trace MR, EF 55 to 60% on echo done 09/22/2017. -EKG from 10/19/2017 with sinus bradycardia and left BBB. -Patient has not seen cardiology  Past Medical History:  Diagnosis Date  . Arthritis    back, knees, shoulder, hands , injections in pelvis, for bone spurs (05/04/2016)  . Chronic back pain   . Diverticulosis   . History of kidney stones   . HLD (hyperlipidemia)   . Hypertension   . Migraine    hx (05/04/2016)  . Peripheral neuropathy   . Radiculopathy   . Sinus complaint   . Sinus headache    occasional (05/04/2016)  . Type II diabetes mellitus (HCC)    diet controlled, no meds now, was on insulin & po treatment at one time    . Vertigo     Allergies  Allergen Reactions  . Acetaminophen Hives and Nausea Only  . Atorvastatin Nausea Only    weakness  . Azithromycin Hives and Nausea And Vomiting    ROS General: Denies fever, chills, night sweats, changes in weight, changes in appetite HEENT: Denies headaches, ear pain, changes in vision, rhinorrhea, sore throat CV: Denies palpitations, SOB, orthopnea  +substernal CP Pulm: Denies SOB, cough, wheezing GI: Denies abdominal pain, nausea, vomiting, diarrhea, constipation GU: Denies dysuria, hematuria, frequency, vaginal  discharge Msk: Denies muscle cramps, joint pains Neuro: Denies weakness, numbness, tingling Skin: Denies rashes, bruising Psych: Denies depression, anxiety, hallucinations     Objective:    Blood pressure 132/86, pulse 60, temperature 98 F (36.7 C), temperature source Oral, weight 172 lb (78 kg), SpO2 96 %.   Gen. Pleasant, well-nourished, in no distress, normal affect   HEENT: [place]/AT, face symmetric, no scleral icterus, PERRLA, nares patent without drainage Lungs: no accessory muscle use, CTAB, no wheezes or rales Cardiovascular: RRR, no m/r/g, no peripheral edema Musculoskeletal: TTP of mid and lower sternum and L chest.  No deformities, no cyanosis or clubbing, normal tone Neuro:  A&Ox3, CN II-XII intact, normal gait Skin: Warm, dry, intact  Wt Readings from Last 3 Encounters:  12/20/17 172 lb (78 kg)  11/17/17 170 lb (77.1 kg)  10/18/17 170 lb (77.1 kg)    Lab Results  Component Value Date   WBC 9.7 05/04/2016   HGB 14.4 05/04/2016   HCT 42.2 05/04/2016   PLT 466 (H) 05/04/2016   GLUCOSE 168 (H) 05/04/2016   ALT 16 05/04/2016   AST 14 (L) 05/04/2016   NA 142 05/04/2016   K 3.5 05/04/2016   CL 107 05/04/2016   CREATININE 0.87 05/04/2016   BUN 12 05/04/2016   CO2 25 05/04/2016   INR 1.06 01/16/2015   HGBA1C 6.2 (H) 04/14/2016    Assessment/Plan:  Chest pain, unspecified type -Discussed possible causes including  MSK such as costochondritis, less likely pericarditis given EKG, versus cardiac cause -EKG obtained this visit with sinus bradycardia and LBBB.  Similar reading noted on EKG from 10/19/2016 provided by pt at last visit (was to be scanned into pt's chart) -Discussed need for cardiology follow-up.  Pt agrees to this. -We will obtain stat labs including troponin. -Continue daily aspirin 81 mg. -Pt given ED precautions for worsening symptoms - Plan: EKG 12-Lead  Left bundle branch block  -Given handout -Plan: Ambulatory referral to  Cardiology  HTN -Continue Norvasc 5 mg daily. -Olmesartan 5 mg on med list, however pt has not been taking this medication.  Initially prescribed for pt to start if bp was consistently greater than 140/90.  Follow-up PRN  More than 50% of over 25 minutes spent in total in caring for this patient was spent face-to-face with the patient, counseling and/or coordinating care.    Grier Mitts, MD

## 2018-01-25 ENCOUNTER — Encounter: Payer: Self-pay | Admitting: Cardiovascular Disease

## 2018-01-25 ENCOUNTER — Ambulatory Visit: Payer: Medicare Other | Admitting: Cardiovascular Disease

## 2018-01-25 DIAGNOSIS — I208 Other forms of angina pectoris: Secondary | ICD-10-CM

## 2018-01-25 DIAGNOSIS — I447 Left bundle-branch block, unspecified: Secondary | ICD-10-CM | POA: Diagnosis not present

## 2018-01-25 DIAGNOSIS — I739 Peripheral vascular disease, unspecified: Secondary | ICD-10-CM

## 2018-01-25 DIAGNOSIS — I10 Essential (primary) hypertension: Secondary | ICD-10-CM | POA: Diagnosis not present

## 2018-01-25 DIAGNOSIS — E7800 Pure hypercholesterolemia, unspecified: Secondary | ICD-10-CM

## 2018-01-25 DIAGNOSIS — R079 Chest pain, unspecified: Secondary | ICD-10-CM | POA: Insufficient documentation

## 2018-01-25 DIAGNOSIS — I6523 Occlusion and stenosis of bilateral carotid arteries: Secondary | ICD-10-CM | POA: Diagnosis not present

## 2018-01-25 DIAGNOSIS — I779 Disorder of arteries and arterioles, unspecified: Secondary | ICD-10-CM | POA: Insufficient documentation

## 2018-01-25 NOTE — Assessment & Plan Note (Signed)
History of hyperlipidemia not on statin therapy. 

## 2018-01-25 NOTE — Progress Notes (Signed)
01/25/2018 Angela Pugh   1946/03/20  [id number]  Primary Physician Angela Ruddy, MD Primary Cardiologist: Angela Harp MD Angela Pugh, [place]  HPI:  Angela Pugh is a 72 y.o. 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  She was referred by Dr. Volanda Pugh for cardiovascular evaluation because of newly recognized left bundle branch block and chest pain.  She does have a history of hypertension and diet-controlled diabetes.  She is never had a heart attack or stroke.  She has had recent carotid Dopplers performed in May revealing moderate bilateral ICA stenosis.  She had a newly recognized left bundle branch block as well.  She is had chest pain for several years which has increased in frequency and severity now occurring on a daily basis.  Is no relation to food or activity.   Current Meds  Medication Sig  . amLODipine (NORVASC) 5 MG tablet Take 5 mg by mouth at bedtime.   Marland Kitchen aspirin 81 MG EC tablet Take 81 mg by mouth daily. Swallow whole.  . Multiple Vitamins-Minerals (CENTRUM ADULTS PO) Take by mouth.  . olmesartan (BENICAR) 5 MG tablet Take 1 tablet (5 mg total) by mouth daily.  Marland Kitchen triamcinolone cream (KENALOG) 0.5 % APP EXT AA BID     Allergies  Allergen Reactions  . Acetaminophen Hives and Nausea Only  . Atorvastatin Nausea Only    weakness  . Azithromycin Hives and Nausea And Vomiting    Social History   Socioeconomic History  . Marital status: Single    Spouse name: Not on file  . Number of children: Not on file  . Years of education: Not on file  . Highest education level: Not on file  Occupational History  . Not on file  Social Needs  . Financial resource strain: Not on file  . Food insecurity:    Worry: Not on file    Inability: Not on file  . Transportation needs:    Medical: Not on file    Non-medical: Not on file  Tobacco Use  . Smoking status:  Former Smoker    Packs/day: 0.50    Years: 20.00    Pack years: 10.00    Types: Cigarettes    Last attempt to quit: 06/30/1982    Years since quitting: 35.5  . Smokeless tobacco: Never Used  Substance and Sexual Activity  . Alcohol use: Yes    Comment: 05/04/2016 "might have a drink a couple days/year; might not"  . Drug use: No  . Sexual activity: Not Currently  Lifestyle  . Physical activity:    Days per week: Not on file    Minutes per session: Not on file  . Stress: Not on file  Relationships  . Social connections:    Talks on phone: Not on file    Gets together: Not on file    Attends religious service: Not on file    Active member of club or organization: Not on file    Attends meetings of clubs or organizations: Not on file    Relationship status: Not on file  . Intimate partner violence:    Fear of current or ex partner: Not on file    Emotionally abused: Not on file    Physically abused: Not on file    Forced sexual activity: Not on file  Other Topics Concern  . Not on file  Social History  Narrative  . Not on file     Review of Systems: General: negative for chills, fever, night sweats or weight changes.  Cardiovascular: negative for chest pain, dyspnea on exertion, edema, orthopnea, palpitations, paroxysmal nocturnal dyspnea or shortness of breath Dermatological: negative for rash Respiratory: negative for cough or wheezing Urologic: negative for hematuria Abdominal: negative for nausea, vomiting, diarrhea, bright red blood per rectum, melena, or hematemesis Neurologic: negative for visual changes, syncope, or dizziness All other systems reviewed and are otherwise negative except as noted above.    Blood pressure (!) 164/87, pulse (!) 59, height 5\' 9"  (1.753 m), weight 176 lb 12.8 oz (80.2 kg), SpO2 97 %.  General appearance: alert and no distress Neck: no adenopathy, no carotid bruit, no JVD, supple, symmetrical, trachea midline and thyroid not enlarged,  symmetric, no tenderness/mass/nodules Lungs: clear to auscultation bilaterally Heart: regular rate and rhythm, S1, S2 normal, no murmur, click, rub or gallop Extremities: extremities normal, atraumatic, no cyanosis or edema Pulses: 2+ and symmetric Skin: Skin color, texture, turgor normal. No rashes or lesions Neurologic: Alert and oriented X 3, normal strength and tone. Normal symmetric reflexes. Normal coordination and gait  EKG not performed today  ASSESSMENT AND PLAN:   Carotid artery disease (Angela Pugh) Recent carotid Dopplers suggested moderate bilateral ICA stenosis performed 09/30/2017.  We will repeat carotid Dopplers in 6 months.  Hypertension History of essential hypertension her blood pressure measured at 164/87.  She is on Benicar and amlodipine.  HLD (hyperlipidemia) History of hyperlipidemia not on statin therapy.  Left bundle branch block Unsure of chronicity.  Chest pain Ms. Angela Pugh has complained of atypical chest pain which occurs several times a day which is fairly new in onset unrelated to food or activity.  She does have a left bundle branch block.  I am going to get a coronary CTA to further evaluate.  Recent 2D echo revealed normal LV size and function performed 09/22/2017.      Angela Harp MD FACP,FACC,FAHA, [facility] 01/25/2018 9:14 AM

## 2018-01-25 NOTE — Assessment & Plan Note (Signed)
History of essential hypertension her blood pressure measured at 164/87.  She is on Benicar and amlodipine.

## 2018-01-25 NOTE — Assessment & Plan Note (Signed)
Angela Pugh has complained of atypical chest pain which occurs several times a day which is fairly new in onset unrelated to food or activity.  She does have a left bundle branch block.  I am going to get a coronary CTA to further evaluate.  Recent 2D echo revealed normal LV size and function performed 09/22/2017.

## 2018-01-25 NOTE — Assessment & Plan Note (Signed)
Recent carotid Dopplers suggested moderate bilateral ICA stenosis performed 09/30/2017.  We will repeat carotid Dopplers in 6 months.

## 2018-01-25 NOTE — Patient Instructions (Addendum)
Medication Instructions:  Your physician recommends that you continue on your current medications as directed. Please refer to the Current Medication list given to you today.   Labwork: Your physician recommends that you return for lab work in: 1 week before scheduled Coronary CT.    Testing/Procedures: Please arrive at the [facility] main entrance of [facility] at___________________AM (30-45 minutes prior to test start time)  [facility] [place], [place] [address] [phone]  Proceed to the [facility] Radiology Department (First Floor).  Please follow these instructions carefully (unless otherwise directed):  Hold all erectile dysfunction medications at least 48 hours prior to test.  On the Night Before the Test: . Drink plenty of water. . Do not consume any caffeinated/decaffeinated beverages or chocolate 12 hours prior to your test. . Do not take any antihistamines 12 hours prior to your test.  On the Day of the Test: . Drink plenty of water. Do not drink any water within one hour of the test. . Do not eat any food 4 hours prior to the test. . You may take your regular medications prior to the test.   After the Test: . Drink plenty of water. . After receiving IV contrast, you may experience a mild flushed feeling. This is normal. . On occasion, you may experience a mild rash up to 24 hours after the test. This is not dangerous. If this occurs, you can take Benadryl 25 mg and increase your fluid intake. . If you experience trouble breathing, this can be serious. If it is severe call 911 IMMEDIATELY. If it is mild, please call our office. . If you take any of these medications: Glipizide/Metformin, Avandament, Glucavance, please do not take 48 hours after completing test.  Your physician has requested that you have a carotid duplex. This test is an ultrasound of the carotid arteries in your neck. It looks at blood flow through these  arteries that supply the brain with blood. Allow one hour for this exam. There are no restrictions or special instructions. SCHEDULE IN 6 MONTHS BEFORE F/U  Follow-Up: Your physician recommends that you schedule a follow-up appointment in: 6 months with Dr. Gwenlyn Found   Any Other Special Instructions Will Be Listed Below (If Applicable).     If you need a refill on your cardiac medications before your next appointment, please call your pharmacy.

## 2018-01-25 NOTE — Assessment & Plan Note (Signed)
Unsure of chronicity at this time, ongoing issue Exam was limited due to patient's catheter pain and he was not able to provide much in terms of HPI regarding this PE revealed a likely stage 1-2 pressure injury to left gluteal cleft  Offered to place referral to Wound care as I am unsure if he has home health services or other care at home but patient declined Recommend he return to the office for evaluation when he is not longer distracted with catheter concerns for more in depth exam and discussion of management plan  

## 2018-02-03 ENCOUNTER — Encounter: Payer: Medicare Other | Admitting: Podiatry

## 2018-02-04 NOTE — Progress Notes (Signed)
Patient left without being seen.

## 2018-02-09 ENCOUNTER — Ambulatory Visit: Payer: Medicare Other | Admitting: Podiatry

## 2018-02-09 ENCOUNTER — Encounter: Payer: Self-pay | Admitting: Podiatry

## 2018-02-09 DIAGNOSIS — M79609 Pain in unspecified limb: Secondary | ICD-10-CM | POA: Diagnosis not present

## 2018-02-09 DIAGNOSIS — M21962 Unspecified acquired deformity of left lower leg: Secondary | ICD-10-CM | POA: Diagnosis not present

## 2018-02-09 DIAGNOSIS — M21961 Unspecified acquired deformity of right lower leg: Secondary | ICD-10-CM

## 2018-02-09 DIAGNOSIS — M2041 Other hammer toe(s) (acquired), right foot: Secondary | ICD-10-CM

## 2018-02-09 DIAGNOSIS — M779 Enthesopathy, unspecified: Secondary | ICD-10-CM

## 2018-02-09 DIAGNOSIS — B351 Tinea unguium: Secondary | ICD-10-CM

## 2018-02-09 DIAGNOSIS — M778 Other enthesopathies, not elsewhere classified: Secondary | ICD-10-CM

## 2018-02-09 DIAGNOSIS — Q828 Other specified congenital malformations of skin: Secondary | ICD-10-CM | POA: Diagnosis not present

## 2018-02-09 NOTE — Progress Notes (Signed)
Subjective:  Patient ID: Angela Pugh, female    DOB: Nov 12, 1945,  MRN: [id number]  Chief Complaint  Patient presents with  . Nail Problem    I HAVE SOME TOENAILS THAT NEED TO BE TRIMMED   . Callouses    TRIM MY CALLUSES ON BOTH FEET    72 y.o. female presents with the above complaint. Would still like to discuss surgery in the future to shorten the metatarsal bones but cannot do at this time as she is undergoing cardiac workup.  Reports pain in the toenails and from the deep cored lesions to both feet. Tried taking care of them herself to no relief.  Review of Systems: Negative except as noted in the HPI. Denies N/V/F/Ch.  Past Medical History:  Diagnosis Date  . Arthritis    back, knees, shoulder, hands , injections in pelvis, for bone spurs (05/04/2016)  . Chronic back pain   . Diverticulosis   . History of kidney stones   . HLD (hyperlipidemia)   . Hypertension   . Migraine    hx (05/04/2016)  . Peripheral neuropathy   . Radiculopathy   . Sinus complaint   . Sinus headache    occasional (05/04/2016)  . Type II diabetes mellitus (HCC)    diet controlled, no meds now, was on insulin & po treatment at one time    . Vertigo     Current Outpatient Medications:  .  amLODipine (NORVASC) 5 MG tablet, Take 5 mg by mouth at bedtime. , Disp: , Rfl:  .  aspirin 81 MG EC tablet, Take 81 mg by mouth daily. Swallow whole., Disp: , Rfl:  .  Multiple Vitamins-Minerals (CENTRUM ADULTS PO), Take by mouth., Disp: , Rfl:  .  olmesartan (BENICAR) 5 MG tablet, Take 1 tablet (5 mg total) by mouth daily., Disp: 30 tablet, Rfl: 3 .  triamcinolone cream (KENALOG) 0.5 %, APP EXT AA BID, Disp: , Rfl: 2  Social History   Tobacco Use  Smoking Status Former Smoker  . Packs/day: 0.50  . Years: 20.00  . Pack years: 10.00  . Types: Cigarettes  . Last attempt to quit: 06/30/1982  . Years since quitting: 35.6  Smokeless Tobacco Never Used    Allergies  Allergen Reactions  .  Acetaminophen Hives and Nausea Only  . Atorvastatin Nausea Only    weakness  . Azithromycin Hives and Nausea And Vomiting   Objective:  There were no vitals filed for this visit. There is no height or weight on file to calculate BMI. Constitutional Well developed. Well nourished.  Vascular Dorsalis pedis pulses palpable bilaterally. Posterior tibial pulses palpable bilaterally. Capillary refill normal to all digits.  No cyanosis or clubbing noted. Pedal hair growth normal.  Neurologic Normal speech. Oriented to person, place, and time. Epicritic sensation to light touch grossly present bilaterally.  Dermatologic Nails elongated, thickened dystrophic x10 with pain to palpation. No open wounds. HPKs sub hallux bilat, submet 2 bilat, right heel, right 5th toe.  Orthopedic: Normal joint ROM without pain or crepitus bilaterally. No visible deformities. No bony tenderness. POP 2nd MPJ dorsally bilat. POP R 5th toe with adductovarus deformity.   Radiographs: None today Assessment:   1. Capsulitis of left foot   2. Deformity of metatarsal bone of left foot   3. Capsulitis of foot, right   4. Metatarsal deformity, right   5. Pain due to onychomycosis of nail   6. Porokeratosis   7. Hammer toe of right foot  Plan:  Patient was evaluated and treated and all questions answered.  Onychomycosis with Pain -Nails debrided x10  Procedure: Nail Debridement Rationale: pain Type of Debridement: manual, sharp debridement. Instrumentation: Nail nipper, rotary burr. Number of Nails: 10   Porokeratoses -Debrided x6 -Educated on self care  Procedure: Paring of Lesion Rationale: painful hyperkeratotic lesion Type of Debridement: manual, sharp debridement. Instrumentation: 312 blade Number of Lesions: 6   Capsulitis -Unable to further discuss surgery at this time due to cardiac workup. -Would benefit from metatarsal osteotomies to realign metatarsal parabola. Will plan after  cardiac workup if no issues arise.  Return if symptoms worsen or fail to improve.

## 2018-02-24 ENCOUNTER — Ambulatory Visit: Payer: Medicare Other | Admitting: Podiatry

## 2018-03-01 LAB — BASIC METABOLIC PANEL
BUN / CREAT RATIO: 13 (ref 12–28)
BUN: 11 mg/dL (ref 8–27)
CHLORIDE: 102 mmol/L (ref 96–106)
CO2: 26 mmol/L (ref 20–29)
CREATININE: 0.83 mg/dL (ref 0.57–1.00)
Calcium: 9.5 mg/dL (ref 8.7–10.3)
GFR calc non Af Amer: 71 mL/min/{1.73_m2} (ref >59)
GFR, EST AFRICAN AMERICAN: 82 mL/min/{1.73_m2} (ref >59)
GLUCOSE: 142 mg/dL — AB (ref 65–99)
Potassium: 3.5 mmol/L (ref 3.5–5.2)
SODIUM: 144 mmol/L (ref 134–144)

## 2018-03-02 ENCOUNTER — Ambulatory Visit (HOSPITAL_COMMUNITY)
Admission: RE | Admit: 2018-03-02 | Discharge: 2018-03-02 | Disposition: A | Discharge status: Home / Self Care | Payer: Medicare Other | Source: Ambulatory Visit | Attending: Cardiovascular Disease | Admitting: Cardiovascular Disease

## 2018-03-02 ENCOUNTER — Encounter: Payer: Medicare Other | Admitting: *Deleted

## 2018-03-02 DIAGNOSIS — I208 Other forms of angina pectoris: Secondary | ICD-10-CM

## 2018-03-02 DIAGNOSIS — I6523 Occlusion and stenosis of bilateral carotid arteries: Secondary | ICD-10-CM | POA: Insufficient documentation

## 2018-03-02 DIAGNOSIS — I447 Left bundle-branch block, unspecified: Secondary | ICD-10-CM

## 2018-03-02 DIAGNOSIS — E7800 Pure hypercholesterolemia, unspecified: Secondary | ICD-10-CM | POA: Insufficient documentation

## 2018-03-02 DIAGNOSIS — I10 Essential (primary) hypertension: Secondary | ICD-10-CM

## 2018-03-02 DIAGNOSIS — Z006 Encounter for examination for normal comparison and control in clinical research program: Secondary | ICD-10-CM

## 2018-03-02 MED ORDER — IOPAMIDOL (ISOVUE-370) INJECTION 76%
100.0000 mL | Freq: Once | INTRAVENOUS | Status: AC | PRN
  Administered 2018-03-02: 100 mL via INTRAVENOUS

## 2018-03-02 MED ORDER — NITROGLYCERIN 0.4 MG SL SUBL
0.8000 mg | SUBLINGUAL_TABLET | Freq: Once | SUBLINGUAL | Status: AC
  Administered 2018-03-02: 0.8 mg via SUBLINGUAL

## 2018-03-02 MED ORDER — NITROGLYCERIN 0.4 MG SL SUBL
SUBLINGUAL_TABLET | SUBLINGUAL | Status: AC
  Administered 2018-03-02: 0.8 mg via SUBLINGUAL
  Filled 2018-03-02: qty 2

## 2018-03-02 NOTE — Research (Signed)
CADFEM Informed Consent           Subject Name:  Angela Pugh. Forget   Subject met inclusion and exclusion criteria.  The informed consent form, study requirements and expectations were reviewed with the subject and questions and concerns were addressed prior to the signing of the consent form.  The subject verbalized understanding of the trial requirements.  The subject agreed to participate in the CADFEM trial and signed the informed consent.  The informed consent was obtained prior to performance of any protocol-specific procedures for the subject.  A copy of the signed informed consent was given to the subject and a copy was placed in the subject's medical record. This patient was consented by Jennifer Knapp   [place] Chalmers, Research Assistant  03/02/2018 14:35 p.m.

## 2018-03-04 ENCOUNTER — Encounter: Payer: Self-pay | Admitting: Cardiovascular Disease

## 2018-03-04 DIAGNOSIS — R0789 Other chest pain: Secondary | ICD-10-CM

## 2018-03-04 NOTE — Telephone Encounter (Signed)
  Returning call from 10/14 regarding results

## 2018-03-04 NOTE — Telephone Encounter (Signed)
This encounter was created in error - please disregard.

## 2018-03-08 ENCOUNTER — Encounter: Payer: Self-pay | Admitting: Cardiovascular Disease

## 2018-03-08 ENCOUNTER — Ambulatory Visit: Payer: Medicare Other | Admitting: Cardiovascular Disease

## 2018-03-08 VITALS — BP 116/72 | HR 63 | Ht 69.0 in | Wt 176.0 lb

## 2018-03-08 DIAGNOSIS — I10 Essential (primary) hypertension: Secondary | ICD-10-CM

## 2018-03-08 DIAGNOSIS — I208 Other forms of angina pectoris: Secondary | ICD-10-CM | POA: Diagnosis not present

## 2018-03-08 DIAGNOSIS — E7800 Pure hypercholesterolemia, unspecified: Secondary | ICD-10-CM

## 2018-03-08 DIAGNOSIS — Z01812 Encounter for preprocedural laboratory examination: Secondary | ICD-10-CM

## 2018-03-08 NOTE — Assessment & Plan Note (Signed)
She had chest pain with a recent CT FFR that suggested a physiologically significant proximal LAD stenosis performed 03/04/2018.  Based on this, I am going to arrange for her to undergo outpatient radial diagnostic cath a week from this Thursday. The patient understands that risks included but are not limited to stroke (1 in 1000), death (1 in 4), kidney failure [usually temporary] (1 in 500), bleeding (1 in 200), allergic reaction [possibly serious] (1 in 200). The patient understands and agrees to proceed

## 2018-03-08 NOTE — Assessment & Plan Note (Signed)
History of hyperlipidemia intolerant to statin therapy 

## 2018-03-08 NOTE — Assessment & Plan Note (Signed)
She of essential hypertension her blood pressure measured today at 116/72.  She is on Norvasc and Benicar.  Continue current meds at current dosing.

## 2018-03-08 NOTE — Progress Notes (Signed)
03/08/2018 Angela Pugh   [birth year]  [id number]  Primary Physician [name], MD Primary Cardiologist: Lorretta Harp MD [name], [place]  HPI:  Angela Pugh is a 72 y.o.  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  She was referred by Dr. Volanda Napoleon for cardiovascular evaluation because of newly recognized left bundle branch block and chest pain.  I last saw her in the office 01/25/2018. She does have a history of hypertension and diet-controlled diabetes.  She is never had a heart attack or stroke.  She has had recent carotid Dopplers performed in May revealing moderate bilateral ICA stenosis.  She had a newly recognized left bundle branch block as well.  She is had chest pain for several years which has increased in frequency and severity now occurring on a daily basis.  Is no relation to food or activity. A CT FFR performed 03/04/2018 showed a physiologically significant lesion in the proximal LAD with an FFR of 0.78.  Based on this, we decided to proceed with outpatient diagnostic radial cardiac catheterization.  Current Meds  Medication Sig  . amLODipine (NORVASC) 5 MG tablet Take 5 mg by mouth at bedtime.   Marland Kitchen aspirin 81 MG EC tablet Take 81 mg by mouth daily. Swallow whole.  . Multiple Vitamins-Minerals (CENTRUM ADULTS PO) Take by mouth.  . olmesartan (BENICAR) 5 MG tablet Take 1 tablet (5 mg total) by mouth daily.  Marland Kitchen triamcinolone cream (KENALOG) 0.5 % APP EXT AA BID     Allergies  Allergen Reactions  . Acetaminophen Hives and Nausea Only  . Atorvastatin Nausea Only    weakness  . Azithromycin Hives and Nausea And Vomiting    Social History   Socioeconomic History  . Marital status: Widowed    Spouse name: Not on file  . Number of children: Not on file  . Years of education: Not on file  . Highest education level: Not on file  Occupational History  . Not on  file  Social Needs  . Financial resource strain: Not on file  . Food insecurity:    Worry: Not on file    Inability: Not on file  . Transportation needs:    Medical: Not on file    Non-medical: Not on file  Tobacco Use  . Smoking status: Former Smoker    Packs/day: 0.50    Years: 20.00    Pack years: 10.00    Types: Cigarettes    Last attempt to quit: 06/30/1982    Years since quitting: 35.7  . Smokeless tobacco: Never Used  Substance and Sexual Activity  . Alcohol use: Yes    Comment: 05/04/2016 "might have a drink a couple days/year; might not"  . Drug use: No  . Sexual activity: Not Currently  Lifestyle  . Physical activity:    Days per week: Not on file    Minutes per session: Not on file  . Stress: Not on file  Relationships  . Social connections:    Talks on phone: Not on file    Gets together: Not on file    Attends religious service: Not on file    Active member of club or organization: Not on file    Attends meetings of clubs or organizations: Not on file    Relationship status: Not on file  . Intimate partner violence:    Fear of current or ex  partner: Not on file    Emotionally abused: Not on file    Physically abused: Not on file    Forced sexual activity: Not on file  Other Topics Concern  . Not on file  Social History Narrative  . Not on file     Review of Systems: General: negative for chills, fever, night sweats or weight changes.  Cardiovascular: negative for chest pain, dyspnea on exertion, edema, orthopnea, palpitations, paroxysmal nocturnal dyspnea or shortness of breath Dermatological: negative for rash Respiratory: negative for cough or wheezing Urologic: negative for hematuria Abdominal: negative for nausea, vomiting, diarrhea, bright red blood per rectum, melena, or hematemesis Neurologic: negative for visual changes, syncope, or dizziness All other systems reviewed and are otherwise negative except as noted above.    Blood pressure  116/72, pulse 63, height 5\' 9"  (1.753 m), weight 176 lb (79.8 kg).  General appearance: alert and no distress Neck: no adenopathy, no carotid bruit, no JVD, supple, symmetrical, trachea midline and thyroid not enlarged, symmetric, no tenderness/mass/nodules Lungs: clear to auscultation bilaterally Heart: regular rate and rhythm, S1, S2 normal, no murmur, click, rub or gallop Extremities: extremities normal, atraumatic, no cyanosis or edema Pulses: 2+ and symmetric Skin: Skin color, texture, turgor normal. No rashes or lesions Neurologic: Alert and oriented X 3, normal strength and tone. Normal symmetric reflexes. Normal coordination and gait  EKG sinus rhythm at 63 with left bundle branch block unchanged from prior EKGs.  I personally reviewed this EKG  ASSESSMENT AND PLAN:   HLD (hyperlipidemia) History of hyperlipidemia intolerant to statin therapy  Essential hypertension She of essential hypertension her blood pressure measured today at 116/72.  She is on Norvasc and Benicar.  Continue current meds at current dosing.  Chest pain She had chest pain with a recent CT FFR that suggested a physiologically significant proximal LAD stenosis performed 03/04/2018.  Based on this, I am going to arrange for her to undergo outpatient radial diagnostic cath a week from this Thursday. The patient understands that risks included but are not limited to stroke (1 in 1000), death (1 in 79), kidney failure [usually temporary] (1 in 500), bleeding (1 in 200), allergic reaction [possibly serious] (1 in 200). The patient understands and agrees to proceed      Lorretta Harp MD [facility], [facility] 03/08/2018 3:48 PM

## 2018-03-08 NOTE — Patient Instructions (Signed)
Medication Instructions:  Your physician recommends that you continue on your current medications as directed. Please refer to the Current Medication list given to you today.  If you need a refill on your cardiac medications before your next appointment, please call your pharmacy.   Lab work: Your physician recommends that you return for lab work in: TODAY  If you have labs (blood work) drawn today and your tests are completely normal, you will receive your results only by: Marland Kitchen MyChart Message (if you have MyChart) OR . A paper copy in the mail If you have any lab test that is abnormal or we need to change your treatment, we will call you to review the results.  Testing/Procedures: Your physician has requested that you have a cardiac catheterization. Cardiac catheterization is used to diagnose and/or treat various heart conditions. Doctors may recommend this procedure for a number of different reasons. The most common reason is to evaluate chest pain. Chest pain can be a symptom of coronary artery disease (CAD), and cardiac catheterization can show whether plaque is narrowing or blocking your heart's arteries. This procedure is also used to evaluate the valves, as well as measure the blood flow and oxygen levels in different parts of your heart. For further information please visit [email or web address]. Please follow instruction sheet, as given.    Follow-Up: At [facility], you and your health needs are our priority.  As part of our continuing mission to provide you with exceptional heart care, we have created designated Provider Care Teams.  These Care Teams include your primary Cardiologist (physician) and Advanced Practice Providers (APPs -  Physician Assistants and Nurse Practitioners) who all work together to provide you with the care you need, when you need it. You will need a follow up appointment in 4 weeks from 03/17/2018.  Please call our office 2 months in advance to schedule this  appointment.  You may see Dr. Gwenlyn Found or one of the following Advanced Practice Providers on your designated Care Team:   Kerin Ransom, PA-C Roby Lofts, [place] . Sande Rives, PA-C  Any Other Special Instructions Will Be Listed Below (If Applicable).

## 2018-03-08 NOTE — H&P (View-Only) (Signed)
03/08/2018 Angela Pugh   1945/06/26  [id number]  Primary Physician [name], MD Primary Cardiologist: Lorretta Harp MD [name], [place]  HPI:  Angela Pugh is a 72 y.o.  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  She was referred by Dr. Volanda Napoleon for cardiovascular evaluation because of newly recognized left bundle branch block and chest pain.  I last saw her in the office 01/25/2018. She does have a history of hypertension and diet-controlled diabetes.  She is never had a heart attack or stroke.  She has had recent carotid Dopplers performed in May revealing moderate bilateral ICA stenosis.  She had a newly recognized left bundle branch block as well.  She is had chest pain for several years which has increased in frequency and severity now occurring on a daily basis.  Is no relation to food or activity. A CT FFR performed 03/04/2018 showed a physiologically significant lesion in the proximal LAD with an FFR of 0.78.  Based on this, we decided to proceed with outpatient diagnostic radial cardiac catheterization.  Current Meds  Medication Sig  . amLODipine (NORVASC) 5 MG tablet Take 5 mg by mouth at bedtime.   Marland Kitchen aspirin 81 MG EC tablet Take 81 mg by mouth daily. Swallow whole.  . Multiple Vitamins-Minerals (CENTRUM ADULTS PO) Take by mouth.  . olmesartan (BENICAR) 5 MG tablet Take 1 tablet (5 mg total) by mouth daily.  Marland Kitchen triamcinolone cream (KENALOG) 0.5 % APP EXT AA BID     Allergies  Allergen Reactions  . Acetaminophen Hives and Nausea Only  . Atorvastatin Nausea Only    weakness  . Azithromycin Hives and Nausea And Vomiting    Social History   Socioeconomic History  . Marital status: Widowed    Spouse name: Not on file  . Number of children: Not on file  . Years of education: Not on file  . Highest education level: Not on file  Occupational History  . Not on  file  Social Needs  . Financial resource strain: Not on file  . Food insecurity:    Worry: Not on file    Inability: Not on file  . Transportation needs:    Medical: Not on file    Non-medical: Not on file  Tobacco Use  . Smoking status: Former Smoker    Packs/day: 0.50    Years: 20.00    Pack years: 10.00    Types: Cigarettes    Last attempt to quit: 06/30/1982    Years since quitting: 35.7  . Smokeless tobacco: Never Used  Substance and Sexual Activity  . Alcohol use: Yes    Comment: 05/04/2016 "might have a drink a couple days/year; might not"  . Drug use: No  . Sexual activity: Not Currently  Lifestyle  . Physical activity:    Days per week: Not on file    Minutes per session: Not on file  . Stress: Not on file  Relationships  . Social connections:    Talks on phone: Not on file    Gets together: Not on file    Attends religious service: Not on file    Active member of club or organization: Not on file    Attends meetings of clubs or organizations: Not on file    Relationship status: Not on file  . Intimate partner violence:    Fear of current or ex  partner: Not on file    Emotionally abused: Not on file    Physically abused: Not on file    Forced sexual activity: Not on file  Other Topics Concern  . Not on file  Social History Narrative  . Not on file     Review of Systems: General: negative for chills, fever, night sweats or weight changes.  Cardiovascular: negative for chest pain, dyspnea on exertion, edema, orthopnea, palpitations, paroxysmal nocturnal dyspnea or shortness of breath Dermatological: negative for rash Respiratory: negative for cough or wheezing Urologic: negative for hematuria Abdominal: negative for nausea, vomiting, diarrhea, bright red blood per rectum, melena, or hematemesis Neurologic: negative for visual changes, syncope, or dizziness All other systems reviewed and are otherwise negative except as noted above.    Blood pressure  116/72, pulse 63, height 5\' 9"  (1.753 m), weight 176 lb (79.8 kg).  General appearance: alert and no distress Neck: no adenopathy, no carotid bruit, no JVD, supple, symmetrical, trachea midline and thyroid not enlarged, symmetric, no tenderness/mass/nodules Lungs: clear to auscultation bilaterally Heart: regular rate and rhythm, S1, S2 normal, no murmur, click, rub or gallop Extremities: extremities normal, atraumatic, no cyanosis or edema Pulses: 2+ and symmetric Skin: Skin color, texture, turgor normal. No rashes or lesions Neurologic: Alert and oriented X 3, normal strength and tone. Normal symmetric reflexes. Normal coordination and gait  EKG sinus rhythm at 63 with left bundle branch block unchanged from prior EKGs.  I personally reviewed this EKG  ASSESSMENT AND PLAN:   HLD (hyperlipidemia) History of hyperlipidemia intolerant to statin therapy  Essential hypertension She of essential hypertension her blood pressure measured today at 116/72.  She is on Norvasc and Benicar.  Continue current meds at current dosing.  Chest pain She had chest pain with a recent CT FFR that suggested a physiologically significant proximal LAD stenosis performed 03/04/2018.  Based on this, I am going to arrange for her to undergo outpatient radial diagnostic cath a week from this Thursday. The patient understands that risks included but are not limited to stroke (1 in 1000), death (1 in 56), kidney failure [usually temporary] (1 in 500), bleeding (1 in 200), allergic reaction [possibly serious] (1 in 200). The patient understands and agrees to proceed      Lorretta Harp MD [facility], [facility] 03/08/2018 3:48 PM

## 2018-03-09 ENCOUNTER — Telehealth: Payer: Self-pay | Admitting: *Deleted

## 2018-03-09 ENCOUNTER — Encounter: Payer: Self-pay | Admitting: Cardiovascular Disease

## 2018-03-09 DIAGNOSIS — E876 Hypokalemia: Secondary | ICD-10-CM

## 2018-03-09 DIAGNOSIS — Z01812 Encounter for preprocedural laboratory examination: Secondary | ICD-10-CM

## 2018-03-09 LAB — BASIC METABOLIC PANEL
BUN/Creatinine Ratio: 16 (ref 12–28)
BUN: 12 mg/dL (ref 8–27)
CALCIUM: 9.2 mg/dL (ref 8.7–10.3)
CO2: 24 mmol/L (ref 20–29)
CREATININE: 0.76 mg/dL (ref 0.57–1.00)
Chloride: 103 mmol/L (ref 96–106)
GFR calc Af Amer: 91 mL/min/{1.73_m2} (ref >59)
GFR calc non Af Amer: 79 mL/min/{1.73_m2} (ref >59)
GLUCOSE: 109 mg/dL — AB (ref 65–99)
Potassium: 3.2 mmol/L — ABNORMAL LOW (ref 3.5–5.2)
Sodium: 143 mmol/L (ref 134–144)

## 2018-03-09 LAB — CBC WITH DIFFERENTIAL/PLATELET
BASOS ABS: 0.1 10*3/uL (ref 0.0–0.2)
Basos: 1 %
EOS (ABSOLUTE): 0.1 10*3/uL (ref 0.0–0.4)
EOS: 1 %
HEMATOCRIT: 43 % (ref 34.0–46.6)
HEMOGLOBIN: 14.5 g/dL (ref 11.1–15.9)
IMMATURE GRANS (ABS): 0 10*3/uL (ref 0.0–0.1)
IMMATURE GRANULOCYTES: 0 %
LYMPHS ABS: 3.6 10*3/uL — AB (ref 0.7–3.1)
LYMPHS: 57 %
MCH: 30.5 pg (ref 26.6–33.0)
MCHC: 33.7 g/dL (ref 31.5–35.7)
MCV: 91 fL (ref 79–97)
MONOCYTES: 9 %
Monocytes Absolute: 0.6 10*3/uL (ref 0.1–0.9)
Neutrophils Absolute: 2 10*3/uL (ref 1.4–7.0)
Neutrophils: 32 %
Platelets: 287 10*3/uL (ref 150–450)
RBC: 4.75 x10E6/uL (ref 3.77–5.28)
RDW: 12.3 % (ref 12.3–15.4)
WBC: 6.3 10*3/uL (ref 3.4–10.8)

## 2018-03-09 LAB — TSH: TSH: 2.78 u[IU]/mL (ref 0.450–4.500)

## 2018-03-09 NOTE — Telephone Encounter (Addendum)
Left message for pt to call, per dr berry the patient will need to take 40 meq of potassium x one dose either Friday or Monday and then come to the office Tuesday next week for recheck. Her procedure is Thursday. Lab order placed.  ----- Message from Lorretta Harp, MD sent at 03/09/2018  9:09 AM EDT ----- Labs okay for cardiac catheterization except for low okay.  She needs potassium repletion and recheck.  Her serum potassium needs to be greater than 3.7 prior to cath.

## 2018-03-10 ENCOUNTER — Other Ambulatory Visit: Payer: Self-pay | Admitting: Cardiovascular Disease

## 2018-03-10 MED ORDER — POTASSIUM CHLORIDE CRYS ER 20 MEQ PO TBCR: 40.0000 meq | EXTENDED_RELEASE_TABLET | Freq: Once | ORAL | 3 refills | Status: DC

## 2018-03-10 NOTE — Telephone Encounter (Signed)
LMTCB

## 2018-03-10 NOTE — Telephone Encounter (Signed)
Spoke to patient. She is aware to take potassium as instructed and will come to lab on Tuesday. Prescription e-sent to pharmacy. BMP already ordered.

## 2018-03-10 NOTE — Telephone Encounter (Signed)
Patient returning call to nurse  

## 2018-03-15 ENCOUNTER — Other Ambulatory Visit: Payer: Self-pay | Admitting: *Deleted

## 2018-03-15 ENCOUNTER — Other Ambulatory Visit: Payer: Self-pay | Admitting: Family Medicine

## 2018-03-15 ENCOUNTER — Telehealth: Payer: Self-pay | Admitting: *Deleted

## 2018-03-15 DIAGNOSIS — E876 Hypokalemia: Secondary | ICD-10-CM

## 2018-03-15 DIAGNOSIS — I10 Essential (primary) hypertension: Secondary | ICD-10-CM

## 2018-03-15 NOTE — Telephone Encounter (Addendum)
Pt contacted pre-catheterization scheduled at [facility] for: Thursday March 17, 2018 2 PM Verified arrival time and place: [place] Entrance A at: 12 noon  No solid food after midnight prior to cath, clear liquids until 5 AM day of procedure. Contrast allergy: no Verified no diabetes medications  AM meds can be  taken pre-cath with sip of water including: ASA 81 mg  Confirm patient has responsible person to drive home post procedure and for 24 hours after you arrive home  LMTCB to discuss instructions with patient-confirm patient had repeat BMP 03/15/18.  I discussed instructions with patient, she verbalized understanding, confirmed she was going to [facility] today for BMP, thanked me for call.

## 2018-03-16 ENCOUNTER — Telehealth: Payer: Self-pay | Admitting: *Deleted

## 2018-03-16 LAB — BASIC METABOLIC PANEL
BUN/Creatinine Ratio: 18 (ref 12–28)
BUN: 14 mg/dL (ref 8–27)
CO2: 25 mmol/L (ref 20–29)
Calcium: 9.2 mg/dL (ref 8.7–10.3)
Chloride: 102 mmol/L (ref 96–106)
Creatinine, Ser: 0.8 mg/dL (ref 0.57–1.00)
GFR calc Af Amer: 86 mL/min/{1.73_m2} (ref >59)
GFR, EST NON AFRICAN AMERICAN: 74 mL/min/{1.73_m2} (ref >59)
Glucose: 122 mg/dL — ABNORMAL HIGH (ref 65–99)
POTASSIUM: 3.5 mmol/L (ref 3.5–5.2)
SODIUM: 145 mmol/L — AB (ref 134–144)

## 2018-03-16 MED ORDER — POTASSIUM CHLORIDE CRYS ER 20 MEQ PO TBCR: EXTENDED_RELEASE_TABLET | ORAL | 0 refills | Status: DC

## 2018-03-16 NOTE — Telephone Encounter (Addendum)
Notes recorded by Lorretta Harp, MD on 03/16/2018 at 8:02 AM EDT Labs are okay for cardiac catheterization. Would give an extra 40 mEq of K. Dur daily for the procedure.  Pt is aware to take KCl 40 mEq today prior to cardiac catheterization 03/17/18.  The patient has been notified of the result and verbalized understanding.  All questions (if any) were answered. Desiree Lucy, RN 03/16/2018 8:56 AM

## 2018-03-17 ENCOUNTER — Ambulatory Visit (HOSPITAL_COMMUNITY)
Admission: RE | Admit: 2018-03-17 | Discharge: 2018-03-17 | Disposition: A | Discharge status: Home / Self Care | Payer: Medicare Other | Source: Ambulatory Visit | Attending: Cardiovascular Disease | Admitting: Cardiovascular Disease

## 2018-03-17 ENCOUNTER — Other Ambulatory Visit: Payer: Self-pay | Admitting: Family Medicine

## 2018-03-17 ENCOUNTER — Ambulatory Visit (HOSPITAL_COMMUNITY)
Admission: RE | Disposition: A | Discharge status: Home / Self Care | Payer: Self-pay | Source: Ambulatory Visit | Attending: Cardiovascular Disease

## 2018-03-17 ENCOUNTER — Other Ambulatory Visit: Payer: Self-pay

## 2018-03-17 ENCOUNTER — Encounter (HOSPITAL_COMMUNITY): Payer: Self-pay | Admitting: Cardiovascular Disease

## 2018-03-17 DIAGNOSIS — I5030 Unspecified diastolic (congestive) heart failure: Secondary | ICD-10-CM | POA: Insufficient documentation

## 2018-03-17 DIAGNOSIS — I208 Other forms of angina pectoris: Secondary | ICD-10-CM

## 2018-03-17 DIAGNOSIS — Z87891 Personal history of nicotine dependence: Secondary | ICD-10-CM | POA: Insufficient documentation

## 2018-03-17 DIAGNOSIS — Z01812 Encounter for preprocedural laboratory examination: Secondary | ICD-10-CM

## 2018-03-17 DIAGNOSIS — E7800 Pure hypercholesterolemia, unspecified: Secondary | ICD-10-CM

## 2018-03-17 DIAGNOSIS — Z7982 Long term (current) use of aspirin: Secondary | ICD-10-CM | POA: Insufficient documentation

## 2018-03-17 DIAGNOSIS — I200 Unstable angina: Secondary | ICD-10-CM | POA: Diagnosis not present

## 2018-03-17 DIAGNOSIS — Z888 Allergy status to other drugs, medicaments and biological substances status: Secondary | ICD-10-CM | POA: Insufficient documentation

## 2018-03-17 DIAGNOSIS — E785 Hyperlipidemia, unspecified: Secondary | ICD-10-CM | POA: Diagnosis not present

## 2018-03-17 DIAGNOSIS — Z881 Allergy status to other antibiotic agents status: Secondary | ICD-10-CM | POA: Diagnosis not present

## 2018-03-17 DIAGNOSIS — E119 Type 2 diabetes mellitus without complications: Secondary | ICD-10-CM | POA: Insufficient documentation

## 2018-03-17 DIAGNOSIS — I6523 Occlusion and stenosis of bilateral carotid arteries: Secondary | ICD-10-CM | POA: Insufficient documentation

## 2018-03-17 DIAGNOSIS — Z79899 Other long term (current) drug therapy: Secondary | ICD-10-CM | POA: Insufficient documentation

## 2018-03-17 DIAGNOSIS — I110 Hypertensive heart disease with heart failure: Secondary | ICD-10-CM | POA: Insufficient documentation

## 2018-03-17 DIAGNOSIS — I447 Left bundle-branch block, unspecified: Secondary | ICD-10-CM | POA: Insufficient documentation

## 2018-03-17 DIAGNOSIS — Z886 Allergy status to analgesic agent status: Secondary | ICD-10-CM | POA: Insufficient documentation

## 2018-03-17 DIAGNOSIS — I10 Essential (primary) hypertension: Secondary | ICD-10-CM

## 2018-03-17 HISTORY — PX: LEFT HEART CATH AND CORONARY ANGIOGRAPHY: CATH118249

## 2018-03-17 LAB — GLUCOSE, CAPILLARY: GLUCOSE-CAPILLARY: 110 mg/dL — AB (ref 70–99)

## 2018-03-17 SURGERY — LEFT HEART CATH AND CORONARY ANGIOGRAPHY
Anesthesia: LOCAL

## 2018-03-17 MED ORDER — FENTANYL CITRATE (PF) 100 MCG/2ML IJ SOLN
INTRAMUSCULAR | Status: AC
  Filled 2018-03-17: qty 2

## 2018-03-17 MED ORDER — HEPARIN (PORCINE) IN NACL 1000-0.9 UT/500ML-% IV SOLN
INTRAVENOUS | Status: AC
  Filled 2018-03-17: qty 1000

## 2018-03-17 MED ORDER — SODIUM CHLORIDE 0.9 % WEIGHT BASED INFUSION: 1.0000 mL/kg/h | INTRAVENOUS | Status: DC

## 2018-03-17 MED ORDER — LIDOCAINE HCL (PF) 1 % IJ SOLN
INTRAMUSCULAR | Status: DC | PRN
  Administered 2018-03-17: 15 mL via INTRADERMAL
  Administered 2018-03-17: 2 mL via INTRADERMAL

## 2018-03-17 MED ORDER — FENTANYL CITRATE (PF) 100 MCG/2ML IJ SOLN
INTRAMUSCULAR | Status: DC | PRN
  Administered 2018-03-17: 25 ug via INTRAVENOUS

## 2018-03-17 MED ORDER — MORPHINE SULFATE (PF) 10 MG/ML IV SOLN: 2.0000 mg | INTRAVENOUS | Status: DC | PRN

## 2018-03-17 MED ORDER — SODIUM CHLORIDE 0.9 % IV SOLN: 250.0000 mL | INTRAVENOUS | Status: DC | PRN

## 2018-03-17 MED ORDER — SODIUM CHLORIDE 0.9% FLUSH: 3.0000 mL | INTRAVENOUS | Status: DC | PRN

## 2018-03-17 MED ORDER — LIDOCAINE HCL (PF) 1 % IJ SOLN
INTRAMUSCULAR | Status: AC
  Filled 2018-03-17: qty 30

## 2018-03-17 MED ORDER — ASPIRIN 81 MG PO CHEW: 81.0000 mg | CHEWABLE_TABLET | Freq: Every day | ORAL | Status: DC

## 2018-03-17 MED ORDER — NITROGLYCERIN 1 MG/10 ML FOR IR/CATH LAB
INTRA_ARTERIAL | Status: AC
  Filled 2018-03-17: qty 10

## 2018-03-17 MED ORDER — ONDANSETRON HCL 4 MG/2ML IJ SOLN: 4.0000 mg | Freq: Four times a day (QID) | INTRAMUSCULAR | Status: DC | PRN

## 2018-03-17 MED ORDER — MIDAZOLAM HCL 2 MG/2ML IJ SOLN
INTRAMUSCULAR | Status: AC
  Filled 2018-03-17: qty 2

## 2018-03-17 MED ORDER — HEPARIN (PORCINE) IN NACL 1000-0.9 UT/500ML-% IV SOLN
INTRAVENOUS | Status: DC | PRN
  Administered 2018-03-17 (×2): 500 mL

## 2018-03-17 MED ORDER — IOHEXOL 350 MG/ML SOLN
INTRAVENOUS | Status: DC | PRN
  Administered 2018-03-17: 65 mL via INTRA_ARTERIAL

## 2018-03-17 MED ORDER — SODIUM CHLORIDE 0.9 % IV SOLN: INTRAVENOUS | Status: DC

## 2018-03-17 MED ORDER — ACETAMINOPHEN 325 MG PO TABS: 650.0000 mg | ORAL_TABLET | ORAL | Status: DC | PRN

## 2018-03-17 MED ORDER — MIDAZOLAM HCL 2 MG/2ML IJ SOLN
INTRAMUSCULAR | Status: DC | PRN
  Administered 2018-03-17: 1 mg via INTRAVENOUS

## 2018-03-17 MED ORDER — VERAPAMIL HCL 2.5 MG/ML IV SOLN
INTRAVENOUS | Status: AC
  Filled 2018-03-17: qty 2

## 2018-03-17 MED ORDER — SODIUM CHLORIDE 0.9% FLUSH: 3.0000 mL | Freq: Two times a day (BID) | INTRAVENOUS | Status: DC

## 2018-03-17 MED ORDER — ASPIRIN 81 MG PO CHEW: 81.0000 mg | CHEWABLE_TABLET | ORAL | Status: DC

## 2018-03-17 MED ORDER — SODIUM CHLORIDE 0.9 % WEIGHT BASED INFUSION
3.0000 mL/kg/h | INTRAVENOUS | Status: AC
  Administered 2018-03-17: 3 mL/kg/h via INTRAVENOUS

## 2018-03-17 SURGICAL SUPPLY — 13 items
CATH INFINITI 5FR MULTPACK ANG (CATHETERS) ×4 IMPLANT
CLOSURE MYNX CONTROL 5F (Vascular Products) ×2 IMPLANT
GLIDESHEATH SLEND A-KIT 6F 22G (SHEATH) ×2 IMPLANT
GUIDEWIRE INQWIRE 1.5J.035X260 (WIRE) ×1 IMPLANT
INQWIRE 1.5J .035X260CM (WIRE) ×2
KIT HEART LEFT (KITS) ×2 IMPLANT
PACK CARDIAC CATHETERIZATION (CUSTOM PROCEDURE TRAY) ×2 IMPLANT
SHEATH PINNACLE 5F 10CM (SHEATH) ×2 IMPLANT
SYR MEDRAD MARK V 150ML (SYRINGE) ×2 IMPLANT
TRANSDUCER W/STOPCOCK (MISCELLANEOUS) ×2 IMPLANT
TUBING CIL FLEX 10 FLL-RA (TUBING) ×2 IMPLANT
WIRE EMERALD 3MM-J .035X150CM (WIRE) ×2 IMPLANT
WIRE HI TORQ VERSACORE-J 145CM (WIRE) ×2 IMPLANT

## 2018-03-17 NOTE — Discharge Instructions (Signed)

## 2018-03-17 NOTE — Interval H&P Note (Signed)
Cath Lab Visit (complete for each Cath Lab visit)  Clinical Evaluation Leading to the Procedure:   ACS: No.  Non-ACS:    Anginal Classification: CCS II  Anti-ischemic medical therapy: Minimal Therapy (1 class of medications)  Non-Invasive Test Results: No non-invasive testing performed  Prior CABG: No previous CABG      History and Physical Interval Note:  03/17/2018 10:18 AM  Angela Pugh  has presented today for surgery, with the diagnosis of abnormal ct  The various methods of treatment have been discussed with the patient and family. After consideration of risks, benefits and other options for treatment, the patient has consented to  Procedure(s): LEFT HEART CATH AND CORONARY ANGIOGRAPHY (N/A) as a surgical intervention .  The patient's history has been reviewed, patient examined, no change in status, stable for surgery.  I have reviewed the patient's chart and labs.  Questions were answered to the patient's satisfaction.     Quay Burow

## 2018-03-18 MED FILL — Verapamil HCl IV Soln 2.5 MG/ML: INTRAVENOUS | Qty: 2 | Status: AC

## 2018-03-18 MED FILL — Nitroglycerin IV Soln 100 MCG/ML in D5W: INTRA_ARTERIAL | Qty: 10 | Status: AC

## 2018-04-12 ENCOUNTER — Ambulatory Visit: Payer: Medicare Other | Admitting: Cardiovascular Disease

## 2018-04-12 ENCOUNTER — Encounter (HOSPITAL_COMMUNITY): Payer: Self-pay

## 2018-04-12 ENCOUNTER — Other Ambulatory Visit: Payer: Self-pay

## 2018-04-12 ENCOUNTER — Emergency Department (HOSPITAL_COMMUNITY)
Admission: EM | Admit: 2018-04-12 | Discharge: 2018-04-12 | Disposition: A | Discharge status: Home / Self Care | Payer: Medicare Other | Attending: Emergency Medicine | Admitting: Emergency Medicine

## 2018-04-12 ENCOUNTER — Encounter: Payer: Self-pay | Admitting: Cardiovascular Disease

## 2018-04-12 VITALS — BP 140/84 | HR 66 | Ht 69.0 in | Wt 176.4 lb

## 2018-04-12 DIAGNOSIS — I428 Other cardiomyopathies: Secondary | ICD-10-CM | POA: Insufficient documentation

## 2018-04-12 DIAGNOSIS — R109 Unspecified abdominal pain: Secondary | ICD-10-CM | POA: Diagnosis present

## 2018-04-12 DIAGNOSIS — Z5321 Procedure and treatment not carried out due to patient leaving prior to being seen by health care provider: Secondary | ICD-10-CM | POA: Insufficient documentation

## 2018-04-12 MED ORDER — CARVEDILOL 3.125 MG PO TABS: 3.1250 mg | ORAL_TABLET | Freq: Two times a day (BID) | ORAL | 3 refills | Status: DC

## 2018-04-12 MED ORDER — IBUPROFEN 400 MG PO TABS
400.0000 mg | ORAL_TABLET | Freq: Once | ORAL | Status: AC | PRN
  Administered 2018-04-12: 400 mg via ORAL
  Filled 2018-04-12: qty 1

## 2018-04-12 NOTE — Progress Notes (Signed)
04/12/2018 Angela Pugh   [birth year]  [id number]  Primary Physician [name], MD Primary Cardiologist: Lorretta Harp MD [name], [place]  HPI:  Angela Pugh is a 72 y.o.  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 She was referred by Dr. Volanda Napoleon for cardiovascular evaluation because of newly recognized left bundle branch block and chest pain.  I last saw her in the office 03/08/2018.She does have a history of hypertension and diet-controlled diabetes. She is never had a heart attack or stroke. She has had recent carotid Dopplers performed in May revealing moderate bilateral ICA stenosis. She had a newly recognized left bundle branch block as well. She is had chest pain for several years which has increased in frequency and severity now occurring on a daily basis. Is no relation to food or activity. A CT FFR performed 03/04/2018 showed a physiologically significant lesion in the proximal LAD with an FFR of 0.78.  Based on this, we decided to proceed with outpatient diagnostic radial cardiac catheterization.  This was performed 03/17/2018 revealing normal coronary arteries and moderate LV dysfunction with an EF of 40%.    Current Meds  Medication Sig  . aspirin 81 MG EC tablet Take 81 mg by mouth daily. Swallow whole.  . Multiple Vitamins-Minerals (CENTRUM ADULTS PO) Take 1 tablet by mouth daily.   Marland Kitchen olmesartan (BENICAR) 5 MG tablet Take 5 mg by mouth daily.  Marland Kitchen olmesartan (BENICAR) 5 MG tablet TAKE 1 TABLET(5 MG) BY MOUTH DAILY  . Omega-3 1000 MG CAPS Take 1 capsule by mouth daily.  . potassium chloride SA (K-DUR,KLOR-CON) 20 MEQ tablet Take 2 tablets (40 mEq) by mouth x 1  . triamcinolone cream (KENALOG) 0.5 % Apply 1 application topically 2 (two) times daily as needed (dry skin).   . [DISCONTINUED] amLODipine (NORVASC) 5 MG tablet Take 5 mg by mouth at bedtime.      Allergies  Allergen Reactions  . Acetaminophen Hives and Nausea Only  . Atorvastatin Nausea Only    weakness  . Azithromycin Hives and Nausea And Vomiting    Social History   Socioeconomic History  . Marital status: Widowed    Spouse name: Not on file  . Number of children: Not on file  . Years of education: Not on file  . Highest education level: Not on file  Occupational History  . Not on file  Social Needs  . Financial resource strain: Not on file  . Food insecurity:    Worry: Not on file    Inability: Not on file  . Transportation needs:    Medical: Not on file    Non-medical: Not on file  Tobacco Use  . Smoking status: Former Smoker    Packs/day: 0.50    Years: 20.00    Pack years: 10.00    Types: Cigarettes    Last attempt to quit: 06/30/1982    Years since quitting: 35.8  . Smokeless tobacco: Never Used  Substance and Sexual Activity  . Alcohol use: Yes    Comment: 05/04/2016 "might have a drink a couple days/year; might not"  . Drug use: No  . Sexual activity: Not Currently  Lifestyle  . Physical activity:    Days per week: Not on file    Minutes per session: Not on file  . Stress: Not on file  Relationships  . Social connections:    Talks  on phone: Not on file    Gets together: Not on file    Attends religious service: Not on file    Active member of club or organization: Not on file    Attends meetings of clubs or organizations: Not on file    Relationship status: Not on file  . Intimate partner violence:    Fear of current or ex partner: Not on file    Emotionally abused: Not on file    Physically abused: Not on file    Forced sexual activity: Not on file  Other Topics Concern  . Not on file  Social History Narrative  . Not on file     Review of Systems: General: negative for chills, fever, night sweats or weight changes.  Cardiovascular: negative for chest pain, dyspnea on exertion, edema, orthopnea, palpitations, paroxysmal nocturnal  dyspnea or shortness of breath Dermatological: negative for rash Respiratory: negative for cough or wheezing Urologic: negative for hematuria Abdominal: negative for nausea, vomiting, diarrhea, bright red blood per rectum, melena, or hematemesis Neurologic: negative for visual changes, syncope, or dizziness All other systems reviewed and are otherwise negative except as noted above.    Blood pressure 140/84, pulse 66, height 5\' 9"  (1.753 m), weight 176 lb 6.4 oz (80 kg), SpO2 100 %.  General appearance: alert and no distress Neck: no adenopathy, no carotid bruit, no JVD, supple, symmetrical, trachea midline and thyroid not enlarged, symmetric, no tenderness/mass/nodules Lungs: clear to auscultation bilaterally Heart: regular rate and rhythm, S1, S2 normal, no murmur, click, rub or gallop Extremities: extremities normal, atraumatic, no cyanosis or edema Pulses: 2+ and symmetric Skin: Skin color, texture, turgor normal. No rashes or lesions Neurologic: Alert and oriented X 3, normal strength and tone. Normal symmetric reflexes. Normal coordination and gait  EKG not performed today  ASSESSMENT AND PLAN:   Hypertension History of essential hypertension on amlodipine and Benicar with blood pressure measured today 140/84.  HLD (hyperlipidemia) History of hyperlipidemia not on statin therapy  Left bundle branch block Chronic  Nonischemic cardiomyopathy ([place]) History of nonischemic cardia myopathy by recent left heart cath which I performed 03/17/2018 radially because of a CTA that showed LAD FFR of 0.78.  Her arteries were completely normal.  Her ejection fraction was in the 40% range.      Lorretta Harp MD FACP,FACC,FAHA, [facility] 04/12/2018 4:19 PM

## 2018-04-12 NOTE — Patient Instructions (Signed)
Medication Instructions:  STOP AMLODIPINE   START CARVEDILOL 3.125 MG 1 TABLET TWICE DAILY  If you need a refill on your cardiac medications before your next appointment, please call your pharmacy.   Lab work: NONE If you have labs (blood work) drawn today and your tests are completely normal, you will receive your results only by: Marland Kitchen MyChart Message (if you have MyChart) OR . A paper copy in the mail If you have any lab test that is abnormal or we need to change your treatment, we will call you to review the results.  Testing/Procedures: NONE  Follow-Up: At [facility], you and your health needs are our priority.  As part of our continuing mission to provide you with exceptional heart care, we have created designated Provider Care Teams.  These Care Teams include your primary Cardiologist (physician) and Advanced Practice Providers (APPs -  Physician Assistants and Nurse Practitioners) who all work together to provide you with the care you need, when you need it. You will need a follow up appointment in 6 months.  Please call our office 2 months in advance to schedule this appointment.  You may see Dr. Gwenlyn Found or one of the following Advanced Practice Providers on your designated Care Team:   Kerin Ransom, PA-C Roby Lofts, [place] . Sande Rives, PA-C  REFERRAL TO [place]  Your physician recommends that you schedule a follow-up appointment in: 2 MONTHS WITH PHARM-MD

## 2018-04-12 NOTE — Assessment & Plan Note (Signed)
History of nonischemic cardia myopathy by recent left heart cath which I performed 03/17/2018 radially because of a CTA that showed LAD FFR of 0.78.  Her arteries were completely normal.  Her ejection fraction was in the 40% range.

## 2018-04-12 NOTE — Assessment & Plan Note (Signed)
History of essential hypertension on amlodipine and Benicar with blood pressure measured today 140/84.

## 2018-04-12 NOTE — ED Notes (Signed)
Pt states to RN, she can not wait any longer and would like to leave and go to another hospital for care. Pt encouraged to stay, pt left without being seen.

## 2018-04-12 NOTE — Assessment & Plan Note (Signed)
Chronic. 

## 2018-04-12 NOTE — Assessment & Plan Note (Signed)
History of hyperlipidemia not on statin therapy. 

## 2018-04-13 ENCOUNTER — Encounter (HOSPITAL_COMMUNITY): Payer: Self-pay

## 2018-04-13 ENCOUNTER — Emergency Department (HOSPITAL_COMMUNITY): Payer: Medicare Other

## 2018-04-13 ENCOUNTER — Emergency Department (HOSPITAL_COMMUNITY)
Admission: EM | Admit: 2018-04-13 | Discharge: 2018-04-13 | Disposition: A | Discharge status: Home / Self Care | Payer: Medicare Other | Attending: Emergency Medicine | Admitting: Emergency Medicine

## 2018-04-13 ENCOUNTER — Other Ambulatory Visit: Payer: Self-pay

## 2018-04-13 DIAGNOSIS — Z87891 Personal history of nicotine dependence: Secondary | ICD-10-CM | POA: Diagnosis not present

## 2018-04-13 DIAGNOSIS — R10813 Right lower quadrant abdominal tenderness: Secondary | ICD-10-CM | POA: Diagnosis not present

## 2018-04-13 DIAGNOSIS — Z79899 Other long term (current) drug therapy: Secondary | ICD-10-CM | POA: Diagnosis not present

## 2018-04-13 DIAGNOSIS — M5441 Lumbago with sciatica, right side: Secondary | ICD-10-CM | POA: Insufficient documentation

## 2018-04-13 DIAGNOSIS — I10 Essential (primary) hypertension: Secondary | ICD-10-CM | POA: Diagnosis not present

## 2018-04-13 DIAGNOSIS — Z87442 Personal history of urinary calculi: Secondary | ICD-10-CM | POA: Insufficient documentation

## 2018-04-13 DIAGNOSIS — E119 Type 2 diabetes mellitus without complications: Secondary | ICD-10-CM | POA: Diagnosis not present

## 2018-04-13 DIAGNOSIS — Z7982 Long term (current) use of aspirin: Secondary | ICD-10-CM | POA: Insufficient documentation

## 2018-04-13 DIAGNOSIS — I77819 Aortic ectasia, unspecified site: Secondary | ICD-10-CM | POA: Diagnosis not present

## 2018-04-13 DIAGNOSIS — M545 Low back pain: Secondary | ICD-10-CM | POA: Diagnosis present

## 2018-04-13 LAB — URINALYSIS, ROUTINE W REFLEX MICROSCOPIC
BILIRUBIN URINE: NEGATIVE
GLUCOSE, UA: NEGATIVE mg/dL
Ketones, ur: NEGATIVE mg/dL
Leukocytes, UA: NEGATIVE
NITRITE: NEGATIVE
PH: 5 (ref 5.0–8.0)
Protein, ur: NEGATIVE mg/dL
SPECIFIC GRAVITY, URINE: 1.026 (ref 1.005–1.030)

## 2018-04-13 LAB — CBC WITH DIFFERENTIAL/PLATELET
ABS IMMATURE GRANULOCYTES: 0.03 10*3/uL (ref 0.00–0.07)
BASOS ABS: 0 10*3/uL (ref 0.0–0.1)
BASOS PCT: 0 %
Eosinophils Absolute: 0.1 10*3/uL (ref 0.0–0.5)
Eosinophils Relative: 1 %
HCT: 40.6 % (ref 36.0–46.0)
Hemoglobin: 13.4 g/dL (ref 12.0–15.0)
IMMATURE GRANULOCYTES: 0 %
LYMPHS PCT: 33 %
Lymphs Abs: 2.6 10*3/uL (ref 0.7–4.0)
MCH: 31.6 pg (ref 26.0–34.0)
MCHC: 33 g/dL (ref 30.0–36.0)
MCV: 95.8 fL (ref 80.0–100.0)
Monocytes Absolute: 1.1 10*3/uL — ABNORMAL HIGH (ref 0.1–1.0)
Monocytes Relative: 14 %
NEUTROS ABS: 3.9 10*3/uL (ref 1.7–7.7)
NEUTROS PCT: 52 %
NRBC: 0 % (ref 0.0–0.2)
PLATELETS: 215 10*3/uL (ref 150–400)
RBC: 4.24 MIL/uL (ref 3.87–5.11)
RDW: 12.2 % (ref 11.5–15.5)
WBC: 7.7 10*3/uL (ref 4.0–10.5)

## 2018-04-13 LAB — BASIC METABOLIC PANEL
ANION GAP: 9 (ref 5–15)
BUN: 19 mg/dL (ref 8–23)
CHLORIDE: 104 mmol/L (ref 98–111)
CO2: 26 mmol/L (ref 22–32)
Calcium: 8.9 mg/dL (ref 8.9–10.3)
Creatinine, Ser: 0.96 mg/dL (ref 0.44–1.00)
GFR, EST NON AFRICAN AMERICAN: 60 mL/min — AB (ref >60)
Glucose, Bld: 111 mg/dL — ABNORMAL HIGH (ref 70–99)
POTASSIUM: 3.5 mmol/L (ref 3.5–5.1)
SODIUM: 139 mmol/L (ref 135–145)

## 2018-04-13 MED ORDER — FENTANYL CITRATE (PF) 100 MCG/2ML IJ SOLN
100.0000 ug | Freq: Once | INTRAMUSCULAR | Status: AC
  Administered 2018-04-13: 100 ug via INTRAVENOUS
  Filled 2018-04-13: qty 2

## 2018-04-13 MED ORDER — FENTANYL CITRATE (PF) 100 MCG/2ML IJ SOLN
50.0000 ug | Freq: Once | INTRAMUSCULAR | Status: AC
  Administered 2018-04-13: 50 ug via INTRAVENOUS
  Filled 2018-04-13: qty 2

## 2018-04-13 MED ORDER — OXYCODONE HCL 5 MG PO TABS: 5.0000 mg | ORAL_TABLET | Freq: Four times a day (QID) | ORAL | 0 refills | Status: DC | PRN

## 2018-04-13 MED ORDER — ONDANSETRON HCL 4 MG/2ML IJ SOLN
4.0000 mg | Freq: Once | INTRAMUSCULAR | Status: AC
  Administered 2018-04-13: 4 mg via INTRAVENOUS
  Filled 2018-04-13: qty 2

## 2018-04-13 NOTE — ED Notes (Signed)
Pt ambulated to BR with minimal assist. Tolerated well.

## 2018-04-13 NOTE — ED Triage Notes (Addendum)
Pt reports lower back pain that radiates in to her R flank and R leg. She reports a hx of back surgeries, but also reports a hx of kidney stones and states that she has not been urinating as much as usual. A&Ox4. Ambulatory with pain. Also endorses intermittent chills. Denies N/V/D.

## 2018-04-13 NOTE — ED Notes (Signed)
Pt stated she felt nauseated after moving around in CT. Judson Roch, RN notified.

## 2018-04-13 NOTE — Discharge Instructions (Addendum)
As we discussed, you should have your primary doctor do a CT scan of your abdominal aorta in the next 6 months.  SEEK IMMEDIATE MEDICAL ATTENTION IF: New numbness, tingling, weakness, or problem with the use of your arms or legs.  Severe back pain not relieved with medications.  Change in bowel or bladder control (if you lose control of stool or urine, or if you are unable to urinate) Increasing pain in any areas of the body (such as chest or abdominal pain).  Shortness of breath, dizziness or fainting.  Nausea (feeling sick to your stomach), vomiting, fever, or sweats.

## 2018-04-13 NOTE — ED Provider Notes (Signed)
[facility] DEPT Provider Note   CSN: [id number] Arrival date & time: 04/13/18  0252     History   Chief Complaint Chief Complaint  Patient presents with  . Back Pain    HPI Angela Pugh is a 72 y.o. female.  The history is provided by the patient.  Back Pain   This is a new problem. The current episode started 2 days ago. The problem occurs constantly. The problem has been gradually worsening. The pain is associated with no known injury. The quality of the pain is described as aching. The pain is severe. The symptoms are aggravated by certain positions (walking). The pain is the same all the time. Associated symptoms include abdominal pain. Pertinent negatives include no chest pain, no fever, no bowel incontinence, no bladder incontinence, no paresis and no weakness. She has tried nothing for the symptoms.  Patient with a history of previous back pain, previous kidney stones, diabetes, presents with back pain.  She reports of the past 2 days she has had right-sided back pain goes into her leg.  She is also reporting abdominal pain.  She also mentioned recent chills.  She also reports decreased urine output.  No fevers or vomiting.  She reports it hurts to walk.  Past Medical History:  Diagnosis Date  . Arthritis    back, knees, shoulder, hands , injections in pelvis, for bone spurs (05/04/2016)  . Chronic back pain   . Diverticulosis   . History of kidney stones   . HLD (hyperlipidemia)   . Hypertension   . Migraine    hx (05/04/2016)  . Peripheral neuropathy   . Radiculopathy   . Sinus complaint   . Sinus headache    occasional (05/04/2016)  . Type II diabetes mellitus (HCC)    diet controlled, no meds now, was on insulin & po treatment at one time    . Vertigo     Patient Active Problem List   Diagnosis Date Noted  . Nonischemic cardiomyopathy ([place]) 04/12/2018  . Chest pain 01/25/2018  . Left bundle branch block 01/25/2018  .  Carotid artery disease ([place]) 01/25/2018  . Seasonal allergies 10/18/2017  . Arthritis 10/18/2017  . Pre-diabetes 10/18/2017  . Protein-calorie malnutrition, severe 05/05/2016  . Dysphagia 05/04/2016  . HNP (herniated nucleus pulposus) with myelopathy, cervical 04/17/2016  . Abdominal pain 02/01/2016  . Acute diverticulitis 02/01/2016  . HLD (hyperlipidemia) 02/01/2016  . Diabetes mellitus without complication ([place]) 83/15/1761  . Hypokalemia 02/01/2016  . Hypertension   . Essential hypertension   . Cervical spondylosis without myelopathy 01/13/2016  . Bilateral carpal tunnel syndrome 01/13/2016  . Herniated lumbar intervertebral disc 06/30/2013    Class: Acute    Past Surgical History:  Procedure Laterality Date  . ANTERIOR CERVICAL DECOMP/DISCECTOMY FUSION N/A 04/17/2016   Procedure: ANTERIOR CERVICAL DECOMPRESSION/DISCECTOMY FUSION CERVICAL THREE - CERVICAL FOUR, POSTERIOR CERVICAL DISCECTOMY CERVICAL SEVEN -THORASIC ONE LEFT;  Surgeon: Ashok Pall, MD;  Location: [facility];  Service: Neurosurgery;  Laterality: N/A;  Anterior/Posterior  . ANTERIOR FUSION CERVICAL SPINE  2000   Dr. Carloyn Manner; "for bone spurs; put hardware in too"  . BACK SURGERY    . LEFT HEART CATH AND CORONARY ANGIOGRAPHY N/A 03/17/2018   Procedure: LEFT HEART CATH AND CORONARY ANGIOGRAPHY;  Surgeon: Lorretta Harp, MD;  Location: [facility] CV LAB;  Service: Cardiovascular;  Laterality: N/A;  . LUMBAR LAMINECTOMY Left 06/30/2013   Procedure: Left L3-4 Extraforaminal approach to excise far lateral herniated nucleus  pulposus;  Surgeon: Jessy Oto, MD;  Location: [facility];  Service: Orthopedics;  Laterality: Left;  . TUBAL LIGATION    . VAGINAL HYSTERECTOMY       OB History   None      Home Medications    Prior to Admission medications   Medication Sig Start Date End Date Taking? Authorizing Provider  aspirin 81 MG EC tablet Take 81 mg by mouth daily. Swallow whole.   Yes [provider]  fluticasone  (FLONASE) 50 MCG/ACT nasal spray Place 1 spray into both nostrils daily as needed for allergies or rhinitis.   Yes [provider]  Multiple Vitamins-Minerals (CENTRUM ADULTS PO) Take 1 tablet by mouth daily.    Yes [provider]  olmesartan (BENICAR) 5 MG tablet Take 5 mg by mouth daily.   Yes [provider]  Omega-3 1000 MG CAPS Take 1,000 mg by mouth daily.    Yes [provider]  potassium chloride SA (K-DUR,KLOR-CON) 20 MEQ tablet Take 2 tablets (40 mEq) by mouth x 1 Patient taking differently: Take 40 mEq by mouth daily.  03/16/18  Yes Lorretta Harp, MD  triamcinolone cream (KENALOG) 0.5 % Apply 1 application topically 2 (two) times daily as needed (dry skin).  08/20/17  Yes [provider]  carvedilol (COREG) 3.125 MG tablet Take 1 tablet (3.125 mg total) by mouth 2 (two) times daily. 04/12/18 07/11/18  Lorretta Harp, MD  olmesartan (BENICAR) 5 MG tablet TAKE 1 TABLET(5 MG) BY MOUTH DAILY Patient not taking: Reported on 04/13/2018 03/18/18   [name], MD    Family History Family History  Problem Relation Age of Onset  . Uterine cancer Mother   . Diabetes type II Sister   . Diabetes Mellitus II Brother     Social History Social History   Tobacco Use  . Smoking status: Former Smoker    Packs/day: 0.50    Years: 20.00    Pack years: 10.00    Types: Cigarettes    Last attempt to quit: 06/30/1982    Years since quitting: 35.8  . Smokeless tobacco: Never Used  Substance Use Topics  . Alcohol use: Yes    Comment: 05/04/2016 "might have a drink a couple days/year; might not"  . Drug use: No     Allergies   Acetaminophen; Atorvastatin; and Azithromycin   Review of Systems Review of Systems  Constitutional: Positive for chills. Negative for fever.  Cardiovascular: Negative for chest pain.  Gastrointestinal: Positive for abdominal pain. Negative for bowel incontinence.  Genitourinary: Positive for decreased urine  volume. Negative for bladder incontinence.  Musculoskeletal: Positive for back pain.  Neurological: Negative for weakness.  All other systems reviewed and are negative.    Physical Exam Updated Vital Signs BP (!) 152/80   Pulse (!) 57   Temp 98.6 F (37 C) (Oral)   Resp 16   SpO2 98%   Physical Exam  CONSTITUTIONAL: Elderly, mild distress due to pain HEAD: Normocephalic/atraumatic EYES: EOMI/PERRL ENMT: Mucous membranes moist NECK: supple no meningeal signs SPINE/BACK:entire spine nontender, lumbar paraspinal tenderness on the right CV: S1/S2 noted  LUNGS: Lungs are clear to auscultation bilaterally, no apparent distress ABDOMEN: soft, mild RLQ tenderness noted, no rebound or guarding, bowel sounds noted throughout abdomen GU:no cva tenderness NEURO: Pt is awake/alert/appropriate, moves all extremitiesx4.  No facial droop.  Limitation in range of motion both legs due to significant back pain. EXTREMITIES: pulses normal/equal, full ROM SKIN: warm, color normal  PSYCH: no abnormalities of mood noted, alert and oriented to situation  ED Treatments / Results  Labs (all labs ordered are listed, but only abnormal results are displayed) Labs Reviewed  BASIC METABOLIC PANEL - Abnormal; Notable for the following components:      Result Value   Glucose, Bld 111 (*)    GFR calc non Af Amer 60 (*)    All other components within normal limits  CBC WITH DIFFERENTIAL/PLATELET - Abnormal; Notable for the following components:   Monocytes Absolute 1.1 (*)    All other components within normal limits  URINALYSIS, ROUTINE W REFLEX MICROSCOPIC - Abnormal; Notable for the following components:   Hgb urine dipstick MODERATE (*)    Bacteria, UA RARE (*)    All other components within normal limits    EKG None  Radiology Ct Renal Stone Study  Result Date: 04/13/2018 CLINICAL DATA:  Flank pain. Low back pain radiating to right flank. EXAM: CT ABDOMEN AND PELVIS WITHOUT CONTRAST  TECHNIQUE: Multidetector CT imaging of the abdomen and pelvis was performed following the standard protocol without IV contrast. COMPARISON:  CT 02/01/2016 FINDINGS: Lower chest: Chronic cardiomegaly with coronary artery calcifications. No pleural fluid or consolidation. Scattered subpleural atelectasis. Hepatobiliary: Again seen hepatic cysts. No new focal hepatic abnormality on noncontrast exam. Gallbladder physiologically distended, no calcified stone. No biliary dilatation. Pancreas: Unchanged proximal pancreatic dilatation at 5 mm. No peripancreatic inflammation. No evidence pancreatic mass on noncontrast exam. Spleen: Normal in size without focal abnormality. Adrenals/Urinary Tract: Left adrenal thickening without dominant nodule. The right adrenal gland is normal. No hydronephrosis or perinephric edema. No urolithiasis. Both ureters are decompressed without stones along the course. Urinary bladder is partially distended. No bladder stone or wall thickening. Stomach/Bowel: Colonic diverticulosis without diverticulitis or pericolonic edema. Mural wall thickening of the sigmoid colon is likely chronic. Moderate colonic stool burden. Normal appendix. No small bowel dilatation, inflammation, or obstruction. Vascular/Lymphatic: Aortic atherosclerosis. Infrarenal ectasia at 2.5 cm. No enlarged abdominal or pelvic lymph nodes. Reproductive: Status post hysterectomy. No adnexal masses. Other: No free air free fluid. Musculoskeletal: Multilevel degenerative change in the lumbar spine. Grade 1 anterolisthesis of L4 on L5 appears facet mediated. There are no acute or suspicious osseous abnormalities. Degenerative change of both hips. IMPRESSION: 1. No renal stones or obstructive uropathy. 2. No acute abnormality in the abdomen/pelvis. 3. Colonic diverticulosis without diverticulitis. Mural wall thickening of the sigmoid colon is likely chronic. 4. Infrarenal aortic ectasia. Recommend annual imaging followup by CTA or  MRA. This recommendation follows 2010 ACCF/AHA/AATS/ACR/ASA/SCA/SCAI/SIR/STS/SVM Guidelines for the Diagnosis and Management of Patients with Thoracic Aortic Disease. 2010; 121: Z660-Y301. Aortic Atherosclerosis (ICD10-I70.0). Electronically Signed   By: Keith Rake M.D.   On: 04/13/2018 05:26    Procedures Procedures    Medications Ordered in ED Medications  fentaNYL (SUBLIMAZE) injection 100 mcg (100 mcg Intravenous Given 04/13/18 0340)  ondansetron (ZOFRAN) injection 4 mg (4 mg Intravenous Given 04/13/18 0340)  fentaNYL (SUBLIMAZE) injection 50 mcg (50 mcg Intravenous Given 04/13/18 0448)     Initial Impression / Assessment and Plan / ED Course  I have reviewed the triage vital signs and the nursing notes.  Pertinent labs  results that were available during my care of the patient were reviewed by me and considered in my medical decision making (see chart for details). Narcotic database reviewed and considered in decision making     4:53 AM Patient presented for back pain that radiated to her legs.  However she did have  some abdominal pain, also tenderness, will advise CT imaging.  She denies any focal weakness, but reports difficulty moving her legs due to back pain.  Denies any recent urinary fecal incontinence.  We are awaiting urinalysis and CT imaging. 7:24 AM Imaging was negative for acute disease.  She does have aorta ectasia, with a follow-up CT scan.  Patient was informed of this.  She feels improved.  She can ambulate.  She has improved movement of her leg.  Suspect musculoskeletal pain, potentially with lumbosacral radiculopathy We will treat with pain medicines at home.  She will need to follow-up in a week.  We discussed return precautions Final Clinical Impressions(s) / ED Diagnoses   Final diagnoses:  Acute right-sided low back pain with right-sided sciatica  Ectasis aorta [facility])    ED Discharge Orders         Ordered    oxyCODONE (ROXICODONE) 5 MG immediate  release tablet  Every 6 hours PRN     04/13/18 0650           Ripley Fraise, MD 04/13/18 [phone]

## 2018-04-13 NOTE — ED Notes (Signed)
Pt attempted to give urine sample but was unable.  

## 2018-04-21 ENCOUNTER — Telehealth: Payer: Self-pay | Admitting: Cardiovascular Disease

## 2018-04-21 MED ORDER — OLMESARTAN MEDOXOMIL 5 MG PO TABS: 10.0000 mg | ORAL_TABLET | Freq: Every day | ORAL | 1 refills | Status: DC

## 2018-04-21 NOTE — Telephone Encounter (Signed)
Called pt lmtcb

## 2018-04-21 NOTE — Telephone Encounter (Signed)
Spoke with pt. Pt sts that he heart rate has been running high over the last couple of days. BP readings below. I asked pt to provide her heart rate along with her BP readings. Pt was seen by Dr.Berry on 11/26 Amlodipine was d/c and Carvedilol 3.125mg  bid was started. Pt is taking Olmesartan 5mg  daily.  This morning @ 5am prior to her meds 160/77 50bom I asked the pt to ck her BP while I held on the phone 157/84 69bpm 12/4 188/92 68bpm 12/3 189/96 57bpm 12/2  174/89 47 bpm 12/1 164/82 57bpm 11/30 164/82 59bpm Pt sts that she has been having intermittent dizziness that she believes is related to the Carvedilol. Pt sts that she has not taken Carvedilol today and she did take an Amlodipine.  Adv pt that I will discuss with our PharmD and call back with her recommendation.  Discussed with Erasmo Downer, PharmD Adv pt that she should not take any more Amlodipine, she should HOLD Carvedilol for 2-3 days and then resume. Adv pt to take the Carvedilol with food. Pt should increase Olmesartan to 10mg  daily (an Rx has been sent to the pt pharmacy) adv pt to continue to monitor her BP before and after medications. Pt was sch to be seen in the HTN clinic with the PharmD for 06/13/18 appt moved up to 05/19/18. Pt aware of appt. Adv pt to contact the office if her BP continues to run high or her heartrate drops below 45bpm, also if the dizziness persist. Pt verbalized understanding to the instruction given and voiced appreciation for the assistance.

## 2018-04-21 NOTE — Telephone Encounter (Signed)
New Message:       Pt says she is on Carvedilol and it is not working,  Blood pressure is still up high. She wants to know if Dr Gwenlyn Found would prescribe something else please.

## 2018-04-21 NOTE — Telephone Encounter (Signed)
Patient called back.   Pt c/o BP issue: STAT if pt c/o blurred vision, one-sided weakness or slurred speech  1. What are your last 5 BP readings?   188/90 189/96 174/89 164/82  2. Are you having any other symptoms (ex. Dizziness, headache, blurred vision, passed out)? Dizzy   3. What is your BP issue? She recently saw Dr. Gwenlyn Found and he gave her Carvedilol and it's not working she wants to know if she can having something else, and if so if she can only have a few days worth of pills to see if it works.

## 2018-05-19 ENCOUNTER — Ambulatory Visit: Payer: Medicare Other | Admitting: Podiatry

## 2018-05-19 ENCOUNTER — Ambulatory Visit (INDEPENDENT_AMBULATORY_CARE_PROVIDER_SITE_OTHER): Payer: Medicare Other | Admitting: Pharmacist Clinician (PhC)/ Clinical Pharmacy Specialist

## 2018-05-19 DIAGNOSIS — M79676 Pain in unspecified toe(s): Secondary | ICD-10-CM

## 2018-05-19 DIAGNOSIS — B351 Tinea unguium: Secondary | ICD-10-CM | POA: Diagnosis not present

## 2018-05-19 DIAGNOSIS — I10 Essential (primary) hypertension: Secondary | ICD-10-CM | POA: Diagnosis not present

## 2018-05-19 DIAGNOSIS — M79609 Pain in unspecified limb: Principal | ICD-10-CM

## 2018-05-19 DIAGNOSIS — Q828 Other specified congenital malformations of skin: Secondary | ICD-10-CM | POA: Diagnosis not present

## 2018-05-19 MED ORDER — AMLODIPINE BESYLATE 5 MG PO TABS: 5.0000 mg | ORAL_TABLET | Freq: Every day | ORAL | 3 refills | Status: DC

## 2018-05-19 NOTE — Assessment & Plan Note (Signed)
Patient with hypertension and non-ischemic cardiomyopathy.  She was switched to carvedilol from amlodipine, but unfortunately, cannot tolerate a high enough dose of the carvedilol to control her blood pressure.  Because of the cardiomyopathy, I would like to keep her on the 3.125 mg dose, but add back the amlodipine 5 mg daily.  She has had no problems with edema at this dose.  She was asked to continue monitoring home BP readings and return in 6 weeks for follow up.

## 2018-05-19 NOTE — Progress Notes (Signed)
[id number]    05/19/2018 Angela Pugh [birth year] [id number]   HPI:  Angela Pugh is a 73 y.o. female patient of Dr Gwenlyn Found, with a PMH below who presents today for hypertension clinic evaluation and heart failure medication titration.  In addition to hypertension, her medical history is significant for non-ischemic cardiomyopathy (EF 40%), LBBB, hyperlipidemia, CAD and DM2 (A1c 6.2- 2017)   When she saw Dr. Gwenlyn Found in November he had her stop the amlodipine and started her on carvedilol 3.125 mg twice daily.  She tolerates the carvedilol well, but has noticed that her blood pressure went up significantly.  When her pressure is higher she reports that it triggers headaches.  She tried taking an extra 1-2 tablets of carvedilol when this happened, but did not get any better results.  Today is is frustrated, as she reports that it took awhile to find the right combination of medications to keep her pressure controlled, and she doesn't want to lose that.    Blood Pressure Goal:  130/80  Current Medications:  Carvedilol 3.125 mg bid, olmesartan 10 mg qd- hs  Family Hx: MGF had MI in his 47's  Neither parent had heart disease  12 siblings - 44 with DM, 3 deceased -74 lung CA - military, 1 drug abuse, 1 sis died at birth  59 children - no hypertension (oldest now 47)   Social Hx: quit smoking 20+ years ago; occaisonal alcohol; 2 cups coffee per day, with occasional 3rd cup in the afternoons  Diet: drinks gatorade (2-3 if working in yard; usually drinks 1-1.5 per day); most meals at home, but occasionally [employer], prefers vegetables over meats  Exercise: works in yard, goes to gym twice per week; trying to add [employer] training; usually water aerobics, walking; recently bought a bike, looking to ride thru neighborhood  Home BP readings: home cuff - [facility]' arm cuff, read within 10 points of office cuff today.  last 10 readings on home cuff show range of 139-172/71-90 with average of 155/79.  Heart rate  range was 48-57 with average of 51.  Intolerances: atorvastatin - weakness  Labs: 03-2018:  Na 139, K 3.5, Glu 111, BUN 19, SCr 0.96 (CrCl 67.9), GFR 60  Wt Readings from Last 3 Encounters:  04/12/18 178 lb (80.7 kg)  04/12/18 176 lb 6.4 oz (80 kg)  03/17/18 (P) 176 lb (79.8 kg)   BP Readings from Last 3 Encounters:  05/19/18 (!) 174/94  04/13/18 118/74  04/12/18 (!) 144/84   Pulse Readings from Last 3 Encounters:  04/13/18 (!) 57  04/12/18 64  04/12/18 66    Current Outpatient Medications  Medication Sig Dispense Refill  . amLODipine (NORVASC) 5 MG tablet Take 1 tablet (5 mg total) by mouth daily. 90 tablet 3  . aspirin 81 MG EC tablet Take 81 mg by mouth daily. Swallow whole.    . carvedilol (COREG) 3.125 MG tablet Take 1 tablet (3.125 mg total) by mouth 2 (two) times daily. 180 tablet 3  . fluticasone (FLONASE) 50 MCG/ACT nasal spray Place 1 spray into both nostrils daily as needed for allergies or rhinitis.    . Multiple Vitamins-Minerals (CENTRUM ADULTS PO) Take 1 tablet by mouth daily.     Marland Kitchen olmesartan (BENICAR) 5 MG tablet Take 2 tablets (10 mg total) by mouth daily. 180 tablet 1  . Omega-3 1000 MG CAPS Take 1,000 mg by mouth daily.     Marland Kitchen oxyCODONE (ROXICODONE) 5 MG immediate release tablet Take 1 tablet (5 mg  total) by mouth every 6 (six) hours as needed for severe pain. 10 tablet 0  . potassium chloride SA (K-DUR,KLOR-CON) 20 MEQ tablet Take 2 tablets (40 mEq) by mouth x 1 (Patient taking differently: Take 40 mEq by mouth daily. ) 2 tablet 0  . triamcinolone cream (KENALOG) 0.5 % Apply 1 application topically 2 (two) times daily as needed (dry skin).   2   No current facility-administered medications for this visit.     Allergies  Allergen Reactions  . Acetaminophen Hives and Nausea Only  . Atorvastatin Nausea Only    weakness  . Azithromycin Hives and Nausea And Vomiting    Past Medical History:  Diagnosis Date  . Arthritis    back, knees, shoulder, hands ,  injections in pelvis, for bone spurs (05/04/2016)  . Chronic back pain   . Diverticulosis   . History of kidney stones   . HLD (hyperlipidemia)   . Hypertension   . Migraine    hx (05/04/2016)  . Peripheral neuropathy   . Radiculopathy   . Sinus complaint   . Sinus headache    occasional (05/04/2016)  . Type II diabetes mellitus (HCC)    diet controlled, no meds now, was on insulin & po treatment at one time    . Vertigo     Blood pressure (!) 174/94.  Essential hypertension Patient with hypertension and non-ischemic cardiomyopathy.  She was switched to carvedilol from amlodipine, but unfortunately, cannot tolerate a high enough dose of the carvedilol to control her blood pressure.  Because of the cardiomyopathy, I would like to keep her on the 3.125 mg dose, but add back the amlodipine 5 mg daily.  She has had no problems with edema at this dose.  She was asked to continue monitoring home BP readings and return in 6 weeks for follow up.    Tommy Medal PharmD CPP [facility] [address] [place], [place] [address] [phone]

## 2018-05-19 NOTE — Patient Instructions (Signed)
Return for a a follow up appointment in 6 weeks  Your blood pressure today is 172/54  (our goal is < 130/80)  Check your blood pressure at home daily and keep record of the readings.  Take your BP meds as follows:  Restart amlodipine 5 mg once daily at bedtime  Continue with the olmesartan (Benicar) and carvedilol  Bring all of your meds, your BP cuff and your record of home blood pressures to your next appointment.  Exercise as you're able, try to walk approximately 30 minutes per day.  Keep salt intake to a minimum, especially watch canned and prepared boxed foods.  Eat more fresh fruits and vegetables and fewer canned items.  Avoid eating in fast food restaurants.    HOW TO TAKE YOUR BLOOD PRESSURE: . Rest 5 minutes before taking your blood pressure. .  Don't smoke or drink caffeinated beverages for at least 30 minutes before. . Take your blood pressure before (not after) you eat. . Sit comfortably with your back supported and both feet on the floor (don't cross your legs). . Elevate your arm to heart level on a table or a desk. . Use the proper sized cuff. It should fit smoothly and snugly around your bare upper arm. There should be enough room to slip a fingertip under the cuff. The bottom edge of the cuff should be 1 inch above the crease of the elbow. . Ideally, take 3 measurements at one sitting and record the average.

## 2018-05-19 NOTE — Progress Notes (Signed)
Subjective:  Patient ID: Angela Pugh, female    DOB: 01-11-1946,  MRN: [id number]  Chief Complaint  Patient presents with  . Nail Problem    3 month nail trim  . Foot Pain    capsulitis of left foot   73 y.o. female presents with the above complaint.  Reports painful toenails to her feet and painful callus formation.  Review of Systems: Negative except as noted in the HPI. Denies N/V/F/Ch.  Past Medical History:  Diagnosis Date  . Arthritis    back, knees, shoulder, hands , injections in pelvis, for bone spurs (05/04/2016)  . Chronic back pain   . Diverticulosis   . History of kidney stones   . HLD (hyperlipidemia)   . Hypertension   . Migraine    hx (05/04/2016)  . Peripheral neuropathy   . Radiculopathy   . Sinus complaint   . Sinus headache    occasional (05/04/2016)  . Type II diabetes mellitus (HCC)    diet controlled, no meds now, was on insulin & po treatment at one time    . Vertigo     Current Outpatient Medications:  .  amLODipine (NORVASC) 5 MG tablet, Take 1 tablet (5 mg total) by mouth daily., Disp: 90 tablet, Rfl: 3 .  aspirin 81 MG EC tablet, Take 81 mg by mouth daily. Swallow whole., Disp: , Rfl:  .  carvedilol (COREG) 3.125 MG tablet, Take 1 tablet (3.125 mg total) by mouth 2 (two) times daily., Disp: 180 tablet, Rfl: 3 .  fluticasone (FLONASE) 50 MCG/ACT nasal spray, Place 1 spray into both nostrils daily as needed for allergies or rhinitis., Disp: , Rfl:  .  Multiple Vitamins-Minerals (CENTRUM ADULTS PO), Take 1 tablet by mouth daily. , Disp: , Rfl:  .  olmesartan (BENICAR) 5 MG tablet, Take 2 tablets (10 mg total) by mouth daily., Disp: 180 tablet, Rfl: 1 .  Omega-3 1000 MG CAPS, Take 1,000 mg by mouth daily. , Disp: , Rfl:  .  oxyCODONE (ROXICODONE) 5 MG immediate release tablet, Take 1 tablet (5 mg total) by mouth every 6 (six) hours as needed for severe pain., Disp: 10 tablet, Rfl: 0 .  potassium chloride SA (K-DUR,KLOR-CON) 20 MEQ tablet,  Take 2 tablets (40 mEq) by mouth x 1 (Patient taking differently: Take 40 mEq by mouth daily. ), Disp: 2 tablet, Rfl: 0 .  triamcinolone cream (KENALOG) 0.5 %, Apply 1 application topically 2 (two) times daily as needed (dry skin). , Disp: , Rfl: 2  Social History   Tobacco Use  Smoking Status Former Smoker  . Packs/day: 0.50  . Years: 20.00  . Pack years: 10.00  . Types: Cigarettes  . Last attempt to quit: 06/30/1982  . Years since quitting: 35.9  Smokeless Tobacco Never Used    Allergies  Allergen Reactions  . Acetaminophen Hives and Nausea Only  . Atorvastatin Nausea Only    weakness  . Azithromycin Hives and Nausea And Vomiting   Objective:  There were no vitals filed for this visit. There is no height or weight on file to calculate BMI. Constitutional Well developed. Well nourished.  Vascular Dorsalis pedis pulses palpable bilaterally. Posterior tibial pulses palpable bilaterally. Capillary refill normal to all digits.  No cyanosis or clubbing noted. Pedal hair growth normal.  Neurologic Normal speech. Oriented to person, place, and time. Epicritic sensation to light touch grossly present bilaterally.  Dermatologic Nails elongated, thickened dystrophic x10 with pain to palpation. No open wounds. HPKs sub  hallux bilat, submet 2 bilat, right heel, right 5th toe.  Orthopedic: Normal joint ROM without pain or crepitus bilaterally. No visible deformities. No bony tenderness. POP 2nd MPJ dorsally bilat. POP R 5th toe with adductovarus deformity.   Radiographs: None today Assessment:   No diagnosis found. Plan:  Patient was evaluated and treated and all questions answered.  Onychomycosis with pain -Nails debrided x10  Procedure: Nail Debridement Rationale: pain Type of Debridement: manual, sharp debridement. Instrumentation: Nail nipper, rotary burr. Number of Nails: 10   Porokeratoses -Debrided x4 -Educated on self care  Procedure: Paring of  Lesion Rationale: painful hyperkeratotic lesion Type of Debridement: manual, sharp debridement. Instrumentation: 312 blade Number of Lesions: 4    No follow-ups on file.

## 2018-06-03 ENCOUNTER — Ambulatory Visit (HOSPITAL_COMMUNITY)
Admission: RE | Admit: 2018-06-03 | Discharge: 2018-06-03 | Disposition: A | Discharge status: Home / Self Care | Payer: Medicare Other | Source: Ambulatory Visit | Attending: Internal Medicine | Admitting: Internal Medicine

## 2018-06-03 ENCOUNTER — Encounter (HOSPITAL_COMMUNITY): Payer: Self-pay | Admitting: Internal Medicine

## 2018-06-03 VITALS — BP 178/94 | HR 55 | Wt 173.8 lb

## 2018-06-03 DIAGNOSIS — Z79899 Other long term (current) drug therapy: Secondary | ICD-10-CM | POA: Insufficient documentation

## 2018-06-03 DIAGNOSIS — Z7982 Long term (current) use of aspirin: Secondary | ICD-10-CM | POA: Diagnosis not present

## 2018-06-03 DIAGNOSIS — E785 Hyperlipidemia, unspecified: Secondary | ICD-10-CM | POA: Insufficient documentation

## 2018-06-03 DIAGNOSIS — Z87891 Personal history of nicotine dependence: Secondary | ICD-10-CM | POA: Insufficient documentation

## 2018-06-03 DIAGNOSIS — I6523 Occlusion and stenosis of bilateral carotid arteries: Secondary | ICD-10-CM | POA: Diagnosis not present

## 2018-06-03 DIAGNOSIS — R42 Dizziness and giddiness: Secondary | ICD-10-CM | POA: Diagnosis not present

## 2018-06-03 DIAGNOSIS — I5020 Unspecified systolic (congestive) heart failure: Secondary | ICD-10-CM | POA: Insufficient documentation

## 2018-06-03 DIAGNOSIS — E1140 Type 2 diabetes mellitus with diabetic neuropathy, unspecified: Secondary | ICD-10-CM | POA: Diagnosis not present

## 2018-06-03 DIAGNOSIS — Z881 Allergy status to other antibiotic agents status: Secondary | ICD-10-CM | POA: Insufficient documentation

## 2018-06-03 DIAGNOSIS — R55 Syncope and collapse: Secondary | ICD-10-CM | POA: Diagnosis not present

## 2018-06-03 DIAGNOSIS — Z833 Family history of diabetes mellitus: Secondary | ICD-10-CM | POA: Diagnosis not present

## 2018-06-03 DIAGNOSIS — I110 Hypertensive heart disease with heart failure: Secondary | ICD-10-CM | POA: Insufficient documentation

## 2018-06-03 DIAGNOSIS — I428 Other cardiomyopathies: Secondary | ICD-10-CM

## 2018-06-03 DIAGNOSIS — I447 Left bundle-branch block, unspecified: Secondary | ICD-10-CM | POA: Diagnosis not present

## 2018-06-03 DIAGNOSIS — Z87442 Personal history of urinary calculi: Secondary | ICD-10-CM | POA: Diagnosis not present

## 2018-06-03 DIAGNOSIS — M1990 Unspecified osteoarthritis, unspecified site: Secondary | ICD-10-CM | POA: Insufficient documentation

## 2018-06-03 NOTE — Progress Notes (Addendum)
Advanced Heart Failure Clinic Note   Referring Physician: Dr Gwenlyn Found PCP: [name], MD PCP-Cardiologist: Dr Gwenlyn Found  HPI: Angela Pugh is a 73 y.o. female with a history of HTN, DM, moderate bilateral ICA stenosis, hyperlipidemia, former tobacco use, and LBBB.  She was referred to Dr Gwenlyn Found for CV evaluation due to reduced EF. He first saw her in 9/19 due to CP. Dr Gwenlyn Found did coronary CTA with FFR on 03/04/18, which showed a physiologically significant lesion in the proximal LAD with an FFR of 0.78. He did LHC 03/17/18, which showed normal coronaries and EF 35-45%.   She last saw Dr Gwenlyn Found on 04/12/18. Her amlodipine was stopped. She was started on coreg. She was referred to Advanced HF clinic with newly reduced EF.  She presents today to establish care in the HF clinic. Overall doing great. Works as a Consulting civil engineer. Active. She denies SOB, orthopnea, edema. No CP or dizziness. She thinks her previous CP was related to indigestion. She had an episode of presyncope the other week while she was sitting on couch. She checked her BP and it was in 17s. Usually she takes a nap and feels better. Occurs once every 3-4 months. She takes her medications at night. She checks her BP daily at home and it runs 120s typically.  Cardiac studies: Echo 09/2017: EF 55-60%, RV normal, mild AI, trace MR, mild TR.  LHC 03/17/18  There is mild to moderate left ventricular systolic dysfunction.  LV end diastolic pressure is mildly elevated.  The left ventricular ejection fraction is 35-45% by visual estimate.  Review of Systems: [y] = yes, [ ]  = no   General: Weight gain [ ] ; Weight loss [ ] ; Anorexia [ ] ; Fatigue [ ] ; Fever [ ] ; Chills [ ] ; Weakness [ ]   Cardiac: Chest pain/pressure [ ] ; Resting SOB [ ] ; Exertional SOB [ ] ; Orthopnea [ ] ; Pedal Edema [ ] ; Palpitations [ ] ; Syncope [ ] ; Presyncope [ ] ; Paroxysmal nocturnal dyspnea[ ]   Pulmonary: Cough [ ] ; Wheezing[ ] ; Hemoptysis[ ] ; Sputum [ ] ; Snoring [ ]     GI: Vomiting[ ] ; Dysphagia[ ] ; Melena[ ] ; Hematochezia [ ] ; Heartburn[ ] ; Abdominal pain [ ] ; Constipation [ ] ; Diarrhea [ ] ; BRBPR [ ]   GU: Hematuria[ ] ; Dysuria [ ] ; Nocturia[ ]   Vascular: Pain in legs with walking [ ] ; Pain in feet with lying flat [ ] ; Non-healing sores [ ] ; Stroke [ ] ; TIA [ ] ; Slurred speech [ ] ;  Neuro: Headaches[ ] ; Vertigo[ ] ; Seizures[ ] ; Paresthesias[ ] ;Blurred vision [ ] ; Diplopia [ ] ; Vision changes [ ]   Ortho/Skin: Arthritis [ ] ; Joint pain [ ] ; Muscle pain [ ] ; Joint swelling [ ] ; Back Pain [ ] ; Rash [ ]   Psych: Depression[ ] ; Anxiety[ ]   Heme: Bleeding problems [ ] ; Clotting disorders [ ] ; Anemia [ ]   Endocrine: Diabetes [ ] ; Thyroid dysfunction[ ]    Past Medical History:  Diagnosis Date  . Arthritis    back, knees, shoulder, hands , injections in pelvis, for bone spurs (05/04/2016)  . Chronic back pain   . Diverticulosis   . History of kidney stones   . HLD (hyperlipidemia)   . Hypertension   . Migraine    hx (05/04/2016)  . Peripheral neuropathy   . Radiculopathy   . Sinus complaint   . Sinus headache    occasional (05/04/2016)  . Type II diabetes mellitus (HCC)    diet controlled, no meds now, was on insulin & po  treatment at one time    . Vertigo     Current Outpatient Medications  Medication Sig Dispense Refill  . amLODipine (NORVASC) 5 MG tablet Take 1 tablet (5 mg total) by mouth daily. 90 tablet 3  . aspirin 81 MG EC tablet Take 81 mg by mouth daily. Swallow whole.    . carvedilol (COREG) 3.125 MG tablet Take 1 tablet (3.125 mg total) by mouth 2 (two) times daily. 180 tablet 3  . Multiple Vitamins-Minerals (CENTRUM ADULTS PO) Take 1 tablet by mouth daily.     Marland Kitchen olmesartan (BENICAR) 5 MG tablet Take 2 tablets (10 mg total) by mouth daily. 180 tablet 1  . Omega-3 1000 MG CAPS Take 1,000 mg by mouth daily.     . potassium chloride SA (K-DUR,KLOR-CON) 20 MEQ tablet Take 2 tablets (40 mEq) by mouth x 1 (Patient taking differently: Take 40  mEq by mouth daily. ) 2 tablet 0  . triamcinolone cream (KENALOG) 0.5 % Apply 1 application topically 2 (two) times daily as needed (dry skin).   2  . fluticasone (FLONASE) 50 MCG/ACT nasal spray Place 1 spray into both nostrils daily as needed for allergies or rhinitis.     No current facility-administered medications for this encounter.     Allergies  Allergen Reactions  . Acetaminophen Hives and Nausea Only  . Atorvastatin Nausea Only    weakness  . Azithromycin Hives and Nausea And Vomiting      Social History   Socioeconomic History  . Marital status: Widowed    Spouse name: Not on file  . Number of children: Not on file  . Years of education: Not on file  . Highest education level: Not on file  Occupational History  . Not on file  Social Needs  . Financial resource strain: Not on file  . Food insecurity:    Worry: Not on file    Inability: Not on file  . Transportation needs:    Medical: Not on file    Non-medical: Not on file  Tobacco Use  . Smoking status: Former Smoker    Packs/day: 0.50    Years: 20.00    Pack years: 10.00    Types: Cigarettes    Last attempt to quit: 06/30/1982    Years since quitting: 35.9  . Smokeless tobacco: Never Used  Substance and Sexual Activity  . Alcohol use: Yes    Comment: 05/04/2016 "might have a drink a couple days/year; might not"  . Drug use: No  . Sexual activity: Not Currently  Lifestyle  . Physical activity:    Days per week: Not on file    Minutes per session: Not on file  . Stress: Not on file  Relationships  . Social connections:    Talks on phone: Not on file    Gets together: Not on file    Attends religious service: Not on file    Active member of club or organization: Not on file    Attends meetings of clubs or organizations: Not on file    Relationship status: Not on file  . Intimate partner violence:    Fear of current or ex partner: Not on file    Emotionally abused: Not on file    Physically  abused: Not on file    Forced sexual activity: Not on file  Other Topics Concern  . Not on file  Social History Narrative  . Not on file      Family History  Problem  Relation Age of Onset  . Uterine cancer Mother   . Diabetes type II Sister   . Diabetes Mellitus II Brother     Vitals:   06/03/18 1206 06/03/18 1208  BP: (!) 184/96 (!) 178/94  Pulse: (!) 55   SpO2: 99%   Weight: 78.8 kg (173 lb 12.8 oz)    Wt Readings from Last 3 Encounters:  06/03/18 78.8 kg (173 lb 12.8 oz)  04/12/18 80.7 kg (178 lb)  04/12/18 80 kg (176 lb 6.4 oz)    PHYSICAL EXAM: General:  Well appearing. No respiratory difficulty HEENT: normal Neck: supple. no JVD. Carotids 2+ bilat; no bruits. No lymphadenopathy or thyromegaly appreciated. Cor: PMI nondisplaced. Loletha Grayer, regular. No rubs, gallops or murmurs. Lungs: clear Abdomen: soft, nontender, nondistended. No hepatosplenomegaly. No bruits or masses. Good bowel sounds. Extremities: no cyanosis, clubbing, rash, edema Neuro: alert & oriented x 3, cranial nerves grossly intact. moves all 4 extremities w/o difficulty. Affect pleasant.  ECG: NSR 46 bpm with 1st degree AV block with LBBB (152 ms). Personally reviewed.    ASSESSMENT & PLAN:  1. New systolic HF due to NICM LHC 02/2018 showed normal coronaries and EF 35-45%.  - Echo 09/2017: EF 55-60%, RV normal, mild AI, trace MR, mild TR. - Echo done in clinic today by Dr Haroldine Laws, which showed EF 35-40%. - NYHA I. Volume stable. Not on lasix.  - DC coreg with bradycardia - Continue olmesartan 5 mg daily - Set up for cardiac MRI.  2. LBBB - Reviewed all her EKGs in epic. She has had LBBB since at least 12/2014. May be contributing to low EF.  3. HTN - Elevated in clinic, but stable at home. Will not increase anything with symptomatic hypotension at home.  - Continue amlodipine 5 mg daily  4. Hyperlipidemia - No lipid panel in epic. Not on a statin.   5. Presyncope/symptomatic hypotension -  Check 14 day Ziopatch.  - DC coreg  Follow up in 2-3 months.   Georgiana Shore, NP 06/03/18   Patient seen and examined with the above-signed Advanced Practice Provider and/or Housestaff. I personally reviewed laboratory data, imaging studies and relevant notes. I independently examined the patient and formulated the important aspects of the plan. I have edited the note to reflect any of my changes or salient points. I have personally discussed the plan with the patient and/or family.  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 Denies HF symptoms currently. Has had several episodes where she says her BP gets low and she feels presyncopal. ECG show LBBB with QRS 171ms and 1AVB. On exam no evidence of volume overload.  I did bedside echo personally and EF ~40%.  Etiology of LV dysfunction unclear but I also have some concern for possible infiltrative CM (I.e. TTR amyloid or sarcoid) give conduction abnormalities and possible neuropathy. Will proceed with cMRI and will also place Zio patch to evaluate presyncopal episodes. HR too low for b-blocker (she is in 71s). Will stop. Continue olmesartan for now. Can titrate as tolerated and can wean amlodipine.   Glori Bickers, MD  5:45 PM

## 2018-06-03 NOTE — Patient Instructions (Signed)
STOP Carvedilol  Your provider has recommended that  you wear a Zio Patch for 14 days.  This monitor will record your heart rhythm for our review.  IF you have any symptoms while wearing the monitor please press the button.  If you have any issues with the patch or you notice a red or orange light on it please call the company at [phone].  Once you remove the patch please mail it back to the company as soon as possible so we can get the results.   Your physician has requested that you have a cardiac MRI. Cardiac MRI uses a computer to create images of your heart as its beating, producing both still and moving pictures of your heart and major blood vessels. For further information please visit [email or web address] Please follow the instruction sheet given to you today for more information.  Follow up with Dr. Haroldine Laws in 2-3 months

## 2018-06-10 ENCOUNTER — Encounter (HOSPITAL_COMMUNITY): Payer: Self-pay | Admitting: *Deleted

## 2018-06-13 ENCOUNTER — Ambulatory Visit: Payer: Medicare Other

## 2018-06-14 ENCOUNTER — Telehealth (HOSPITAL_COMMUNITY): Payer: Self-pay

## 2018-06-14 NOTE — Telephone Encounter (Signed)
Ok to give 2.5mg  valium prior to cMRI. Thanks

## 2018-06-14 NOTE — Telephone Encounter (Signed)
Pt called wanting an Rx for Valium to take before her cardiac MRI due to being claustrophobic. Do you advise this request?

## 2018-06-15 ENCOUNTER — Other Ambulatory Visit (HOSPITAL_COMMUNITY): Payer: Self-pay

## 2018-06-15 MED ORDER — DIAZEPAM 5 MG PO TABS: 2.5000 mg | ORAL_TABLET | Freq: Once | ORAL | 0 refills | Status: AC

## 2018-06-15 NOTE — Telephone Encounter (Signed)
Rx faxed to [facility]

## 2018-06-21 ENCOUNTER — Telehealth (HOSPITAL_COMMUNITY): Payer: Self-pay | Admitting: Emergency Medicine

## 2018-06-21 NOTE — Telephone Encounter (Signed)
Left message on voicemail with name and callback number Jaidon Ellery RN Navigator Cardiac Imaging [facility] [phone] Office [phone] Cell  

## 2018-06-22 ENCOUNTER — Telehealth (HOSPITAL_COMMUNITY): Payer: Self-pay | Admitting: Emergency Medicine

## 2018-06-22 NOTE — Telephone Encounter (Signed)
Reaching out to patient to offer assistance regarding upcoming cardiac imaging study; pt verbalizes understanding of appt date/time, parking situation and where to check in, and verified current allergies; name and call back number provided for further questions should they arise Lenorris Karger RN Navigator Cardiac Imaging [facility] [phone] office [phone] cell 

## 2018-06-23 ENCOUNTER — Ambulatory Visit (HOSPITAL_COMMUNITY)
Admission: RE | Admit: 2018-06-23 | Discharge: 2018-06-23 | Disposition: A | Discharge status: Home / Self Care | Payer: Medicare Other | Source: Ambulatory Visit | Attending: Internal Medicine | Admitting: Internal Medicine

## 2018-06-23 DIAGNOSIS — I428 Other cardiomyopathies: Secondary | ICD-10-CM | POA: Diagnosis not present

## 2018-06-23 LAB — CREATININE, SERUM
Creatinine, Ser: 0.94 mg/dL (ref 0.44–1.00)
GFR calc Af Amer: >60 mL/min (ref >60)
GFR calc non Af Amer: >60 mL/min (ref >60)

## 2018-06-23 MED ORDER — GADOBUTROL 1 MMOL/ML IV SOLN
9.0000 mL | Freq: Once | INTRAVENOUS | Status: AC | PRN
  Administered 2018-06-23: 9 mL via INTRAVENOUS

## 2018-06-27 ENCOUNTER — Telehealth (HOSPITAL_COMMUNITY): Payer: Self-pay

## 2018-06-27 NOTE — Telephone Encounter (Signed)
Spoke with pt about zio results. Pt aware and appreciative of the call. Pt verbalized understanding.

## 2018-06-27 NOTE — Telephone Encounter (Signed)
-----   Message from Jolaine Artist, MD sent at 06/26/2018  1:37 PM EST ----- No major arrhythmias. Continue current therapy.

## 2018-07-01 ENCOUNTER — Telehealth (HOSPITAL_COMMUNITY): Payer: Self-pay

## 2018-07-01 NOTE — Telephone Encounter (Signed)
Pt previously called to receive results of cardiac mri. Left voicemail to call back.

## 2018-07-04 ENCOUNTER — Telehealth (HOSPITAL_COMMUNITY): Payer: Self-pay

## 2018-07-04 NOTE — Telephone Encounter (Signed)
Spoke with pt about limited view of results. Pt appreciative of call and would like results as soon as possible.

## 2018-07-04 NOTE — Telephone Encounter (Signed)
Pt given results of Cardiac MR.  Pt appreciative. No additional questions.

## 2018-07-04 NOTE — Telephone Encounter (Signed)
-----   Message from Jolaine Artist, MD sent at 06/26/2018  1:14 PM EST ----- EF low end normal at 47% in setting of LBBB. No LGE or suggestion of infiltrative process,.

## 2018-07-05 ENCOUNTER — Ambulatory Visit: Payer: Medicare Other

## 2018-07-25 ENCOUNTER — Ambulatory Visit (HOSPITAL_COMMUNITY)
Admission: RE | Admit: 2018-07-25 | Discharge: 2018-07-25 | Disposition: A | Discharge status: Home / Self Care | Payer: Medicare Other | Source: Ambulatory Visit | Attending: Cardiovascular Disease | Admitting: Cardiovascular Disease

## 2018-07-25 DIAGNOSIS — E7800 Pure hypercholesterolemia, unspecified: Secondary | ICD-10-CM | POA: Diagnosis present

## 2018-07-25 DIAGNOSIS — I6523 Occlusion and stenosis of bilateral carotid arteries: Secondary | ICD-10-CM | POA: Insufficient documentation

## 2018-07-25 DIAGNOSIS — I10 Essential (primary) hypertension: Secondary | ICD-10-CM | POA: Diagnosis present

## 2018-07-25 DIAGNOSIS — I208 Other forms of angina pectoris: Secondary | ICD-10-CM | POA: Insufficient documentation

## 2018-07-25 DIAGNOSIS — I447 Left bundle-branch block, unspecified: Secondary | ICD-10-CM | POA: Diagnosis not present

## 2018-07-29 ENCOUNTER — Telehealth: Payer: Self-pay | Admitting: Cardiovascular Disease

## 2018-07-29 DIAGNOSIS — E7800 Pure hypercholesterolemia, unspecified: Secondary | ICD-10-CM

## 2018-07-29 NOTE — Telephone Encounter (Signed)
° ° °  Bridgette from [facility] calling to discuss statin therapy. Also states she is sending a fax Please call [phone]

## 2018-07-29 NOTE — Telephone Encounter (Signed)
Attempted to return call to [facility] from [facility] who was calling to discuss statin therapy for pt. Planned to inform her of last OV note on 04/12/2018 which states the following under assessment and plan:   "HLD (hyperlipidemia) History of hyperlipidemia not on statin therapy"  No recent lipid labs; no recommendations. Will route to MD to advise after getting in contact with Bridgette.  LMTCB

## 2018-08-04 NOTE — Telephone Encounter (Signed)
RETURNED S/W [place] TO RECOMMEND THAT PT START STATIN OR SOME SORT OF CHOLESTEROL MEDICATION

## 2018-08-04 NOTE — Telephone Encounter (Signed)
I do not see a FLP in the system. She needs one to properly evaluate need for a statin

## 2018-08-11 NOTE — Telephone Encounter (Signed)
Left message for the patient, will mail the paperwork for the lab work needed to the patient.

## 2018-08-18 ENCOUNTER — Ambulatory Visit: Payer: Medicare Other | Admitting: Podiatry

## 2018-08-18 ENCOUNTER — Encounter: Payer: Self-pay | Admitting: Podiatry

## 2018-08-18 ENCOUNTER — Other Ambulatory Visit: Payer: Self-pay

## 2018-08-18 VITALS — Temp 97.7°F

## 2018-08-18 DIAGNOSIS — M79676 Pain in unspecified toe(s): Secondary | ICD-10-CM | POA: Diagnosis not present

## 2018-08-18 DIAGNOSIS — B351 Tinea unguium: Secondary | ICD-10-CM | POA: Diagnosis not present

## 2018-08-18 DIAGNOSIS — M79609 Pain in unspecified limb: Principal | ICD-10-CM

## 2018-08-18 DIAGNOSIS — Q828 Other specified congenital malformations of skin: Secondary | ICD-10-CM | POA: Diagnosis not present

## 2018-08-18 NOTE — Progress Notes (Signed)
Subjective:  Patient ID: Angela Pugh, female    DOB: July 15, 1945,  MRN: [id number]  Chief Complaint  Patient presents with  . Debridement    Trim toenails   73 y.o. female presents with the above complaint. Reports the calluses are so bad she can barely walk.  Review of Systems: Negative except as noted in the HPI. Denies N/V/F/Ch.  Past Medical History:  Diagnosis Date  . Arthritis    back, knees, shoulder, hands , injections in pelvis, for bone spurs (05/04/2016)  . Chronic back pain   . Diverticulosis   . History of kidney stones   . HLD (hyperlipidemia)   . Hypertension   . Migraine    hx (05/04/2016)  . Peripheral neuropathy   . Radiculopathy   . Sinus complaint   . Sinus headache    occasional (05/04/2016)  . Type II diabetes mellitus (HCC)    diet controlled, no meds now, was on insulin & po treatment at one time    . Vertigo     Current Outpatient Medications:  .  amLODipine (NORVASC) 5 MG tablet, Take 1 tablet (5 mg total) by mouth daily., Disp: 90 tablet, Rfl: 3 .  aspirin 81 MG EC tablet, Take 81 mg by mouth daily. Swallow whole., Disp: , Rfl:  .  fluticasone (FLONASE) 50 MCG/ACT nasal spray, Place 1 spray into both nostrils daily as needed for allergies or rhinitis., Disp: , Rfl:  .  Multiple Vitamins-Minerals (CENTRUM ADULTS PO), Take 1 tablet by mouth daily. , Disp: , Rfl:  .  olmesartan (BENICAR) 5 MG tablet, Take 2 tablets (10 mg total) by mouth daily., Disp: 180 tablet, Rfl: 1 .  Omega-3 1000 MG CAPS, Take 1,000 mg by mouth daily. , Disp: , Rfl:  .  potassium chloride SA (K-DUR,KLOR-CON) 20 MEQ tablet, Take 2 tablets (40 mEq) by mouth x 1 (Patient taking differently: Take 40 mEq by mouth daily. ), Disp: 2 tablet, Rfl: 0 .  triamcinolone cream (KENALOG) 0.5 %, Apply 1 application topically 2 (two) times daily as needed (dry skin). , Disp: , Rfl: 2  Social History   Tobacco Use  Smoking Status Former Smoker  . Packs/day: 0.50  . Years: 20.00  .  Pack years: 10.00  . Types: Cigarettes  . Last attempt to quit: 06/30/1982  . Years since quitting: 36.1  Smokeless Tobacco Never Used    Allergies  Allergen Reactions  . Acetaminophen Hives and Nausea Only  . Atorvastatin Nausea Only    weakness  . Azithromycin Hives and Nausea And Vomiting   Objective:   Vitals:   08/18/18 1555  Temp: 97.7 F (36.5 C)   There is no height or weight on file to calculate BMI. Constitutional Well developed. Well nourished.  Vascular Dorsalis pedis pulses palpable bilaterally. Posterior tibial pulses palpable bilaterally. Capillary refill normal to all digits.  No cyanosis or clubbing noted. Pedal hair growth normal.  Neurologic Normal speech. Oriented to person, place, and time. Epicritic sensation to light touch grossly present bilaterally.  Dermatologic Nails elongated, thickened dystrophic x10 with pain to palpation. No open wounds. HPKs sub hallux bilat, submet 2 bilat, right heel, right 5th toe.  Orthopedic: Normal joint ROM without pain or crepitus bilaterally. No visible deformities. No bony tenderness. POP 2nd MPJ dorsally bilat. POP R 5th toe with adductovarus deformity.   Radiographs: None today Assessment:   1. Pain due to onychomycosis of nail   2. Porokeratosis    Plan:  Patient was  evaluated and treated and all questions answered.  Onychomycosis with pain -Nails debrided x10  Procedure: Nail Debridement Rationale: pain  Type of Debridement: manual, sharp debridement. Instrumentation: Nail nipper, rotary burr. Number of Nails: 10   Porokeratoses -Debrided x4 -Educated on self care -Dipspense revitaderm and foot miracle cream  Procedure: Paring of Lesion Rationale: painful hyperkeratotic lesion Type of Debridement: manual, sharp debridement. Instrumentation: 312 blade Number of Lesions: 4     Return if symptoms worsen or fail to improve.

## 2018-08-22 ENCOUNTER — Encounter (HOSPITAL_COMMUNITY): Payer: Medicare Other | Admitting: Internal Medicine

## 2018-10-06 ENCOUNTER — Telehealth: Payer: Self-pay | Admitting: Cardiovascular Disease

## 2018-10-06 NOTE — Telephone Encounter (Signed)
Mychart, no smartphone, consent, pre reg complete 10/06/18 AF

## 2018-10-11 ENCOUNTER — Telehealth (INDEPENDENT_AMBULATORY_CARE_PROVIDER_SITE_OTHER): Payer: Medicare Other | Admitting: Cardiovascular Disease

## 2018-10-11 VITALS — BP 173/83 | HR 57 | Ht 69.0 in | Wt 176.0 lb

## 2018-10-11 DIAGNOSIS — I10 Essential (primary) hypertension: Secondary | ICD-10-CM

## 2018-10-11 DIAGNOSIS — Z8639 Personal history of other endocrine, nutritional and metabolic disease: Secondary | ICD-10-CM

## 2018-10-11 DIAGNOSIS — I428 Other cardiomyopathies: Secondary | ICD-10-CM | POA: Diagnosis not present

## 2018-10-11 DIAGNOSIS — E7800 Pure hypercholesterolemia, unspecified: Secondary | ICD-10-CM

## 2018-10-11 DIAGNOSIS — I447 Left bundle-branch block, unspecified: Secondary | ICD-10-CM | POA: Diagnosis not present

## 2018-10-11 NOTE — Patient Instructions (Addendum)
Medication Instructions:  Your physician has recommended you make the following change in your medication:   STOP TAKING YOUR POTASSIUM CHLORIDE.  If you need a refill on your cardiac medications before your next appointment, please call your pharmacy.   Lab work: Your physician recommends that you return for lab work in 2-3 WEEKS: [place]. FASTING LIPID PROFILE. LIVER FUNCTION TEST. YOU WILL RECEIVE A LAB SLIP IN THE MAIL. PLEASE DO NOT EAT OR DRINK (EXCEPT WATER) ANYTHING AFTER MIDNIGHT ON THE DAY YOU CHOOSE TO PRESENT FOR LAB WORK. YOU MAY EAT AFTER YOUR BLOOD HAS BEEN COLLECTED. NO APPOINTMENT IS NEEDED.  If you have labs (blood work) drawn today and your tests are completely normal, you will receive your results only by: Marland Kitchen MyChart Message (if you have MyChart) OR . A paper copy in the mail If you have any lab test that is abnormal or we need to change your treatment, we will call you to review the results.  Testing/Procedures: NONE  Follow-Up: At [facility], you and your health needs are our priority.  As part of our continuing mission to provide you with exceptional heart care, we have created designated Provider Care Teams.  These Care Teams include your primary Cardiologist (physician) and Advanced Practice Providers (APPs -  Physician Assistants and Nurse Practitioners) who all work together to provide you with the care you need, when you need it. You will need a follow up appointment in 6 months with Dr. Gwenlyn Found.  Please call our office 2 months in advance to schedule this appointment.

## 2018-10-11 NOTE — Progress Notes (Signed)
Virtual Visit via Telephone Note   This visit type was conducted due to national recommendations for restrictions regarding the COVID-19 Pandemic (e.g. social distancing) in an effort to limit this patient's exposure and mitigate transmission in our community.  Due to her co-morbid illnesses, this patient is at least at moderate risk for complications without adequate follow up.  This format is felt to be most appropriate for this patient at this time.  The patient did not have access to video technology/had technical difficulties with video requiring transitioning to audio format only (telephone).  All issues noted in this document were discussed and addressed.  No physical exam could be performed with this format.  Please refer to the patient's chart for her  consent to telehealth for [facility].   Date:  10/11/2018   ID:  Angela Pugh, DOB [birth year], MRN [id number]  Patient Location: Home Provider Location: Office  PCP:  [name], MD  Cardiologist: Dr. Quay Burow Electrophysiologist:  None   Evaluation Performed:  Follow-Up Visit  Chief Complaint: Left ventricular dysfunction  History of Present Illness:    Angela Pugh is a 73 y.o.  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 She was referred by Dr. Volanda Napoleon for cardiovascular evaluation because of newly recognized left bundle branch block and chest pain.I last saw her in the office  04/12/2018.She does have a history of hypertension and diet-controlled diabetes. She is never had a heart attack or stroke. She has had recent carotid Dopplers performed in May revealing moderate bilateral ICA stenosis. She had a newly recognized left bundle branch block as well. She is had chest pain for several years which has increased in frequency and severity now occurring on a daily basis. Is no relation to food or activity. A CT FFR  performed 03/04/2018 showed a physiologically significant lesion in the proximal LAD with an FFR of 0.78. Based on this, we decided to proceed with outpatient diagnostic radial cardiac catheterization.  This was performed 03/17/2018 revealing normal coronary arteries and moderate LV dysfunction with an EF of 40%.  I did refer her to Dr. Haroldine Laws who did a cardiac MRI that showed no evidence of myocarditis and an event monitor that was normal as well.  She has a nonischemic cardiomyopathy with an EF in the 35 to 40% range.  She is on low-dose Benicar amlodipine.  She gets episodic episodes of breath and atypical chest pain that improves with Gas-X.   The patient does not have symptoms concerning for COVID-19 infection (fever, chills, cough, or new shortness of breath).    Past Medical History:  Diagnosis Date  . Arthritis    back, knees, shoulder, hands , injections in pelvis, for bone spurs (05/04/2016)  . Chronic back pain   . Diverticulosis   . History of kidney stones   . HLD (hyperlipidemia)   . Hypertension   . Migraine    hx (05/04/2016)  . Peripheral neuropathy   . Radiculopathy   . Sinus complaint   . Sinus headache    occasional (05/04/2016)  . Type II diabetes mellitus (HCC)    diet controlled, no meds now, was on insulin & po treatment at one time    . Vertigo    Past Surgical History:  Procedure Laterality Date  . ANTERIOR CERVICAL DECOMP/DISCECTOMY FUSION N/A 04/17/2016   Procedure: ANTERIOR CERVICAL DECOMPRESSION/DISCECTOMY FUSION CERVICAL THREE - CERVICAL FOUR, POSTERIOR CERVICAL  DISCECTOMY CERVICAL SEVEN -THORASIC ONE LEFT;  Surgeon: Ashok Pall, MD;  [facility];  Service: Neurosurgery;  Laterality: N/A;  Anterior/Posterior  . ANTERIOR FUSION CERVICAL SPINE  2000   Dr. Carloyn Manner; "for bone spurs; put hardware in too"  . BACK SURGERY    . LEFT HEART CATH AND CORONARY ANGIOGRAPHY N/A 03/17/2018   Procedure: LEFT HEART CATH AND CORONARY ANGIOGRAPHY;  Surgeon:  Lorretta Harp, MD;  Location: [place] [facility];  Service: Cardiovascular;  Laterality: N/A;  . LUMBAR LAMINECTOMY Left 06/30/2013   Procedure: Left L3-4 Extraforaminal approach to excise far lateral herniated nucleus pulposus;  Surgeon: Jessy Oto, MD;  Location: [place];  Service: Orthopedics;  Laterality: Left;  . TUBAL LIGATION    . VAGINAL HYSTERECTOMY       Current Meds  Medication Sig  . aspirin 81 MG EC tablet Take 81 mg by mouth daily. Swallow whole.  . fluticasone (FLONASE) 50 MCG/ACT nasal spray Place 1 spray into both nostrils daily as needed for allergies or rhinitis.  . Multiple Vitamins-Minerals (CENTRUM ADULTS PO) Take 1 tablet by mouth daily.   Marland Kitchen olmesartan (BENICAR) 5 MG tablet Take 2 tablets (10 mg total) by mouth daily.  . Omega-3 1000 MG CAPS Take 1,000 mg by mouth daily.   . potassium chloride SA (K-DUR,KLOR-CON) 20 MEQ tablet Take 2 tablets (40 mEq) by mouth x 1 (Patient taking differently: Take 40 mEq by mouth daily. )  . triamcinolone cream (KENALOG) 0.5 % Apply 1 application topically 2 (two) times daily as needed (dry skin).      Allergies:   Acetaminophen; Atorvastatin; and Azithromycin   Social History   Tobacco Use  . Smoking status: Former Smoker    Packs/day: 0.50    Years: 20.00    Pack years: 10.00    Types: Cigarettes    Last attempt to quit: 06/30/1982    Years since quitting: 36.3  . Smokeless tobacco: Never Used  Substance Use Topics  . Alcohol use: Yes    Comment: 05/04/2016 "might have a drink a couple days/year; might not"  . Drug use: No     Family Hx: The patient's family history includes Diabetes Mellitus II in her brother; Diabetes type II in her sister; Uterine cancer in her mother.  ROS:   Please see the history of present illness.     All other systems reviewed and are negative.   Prior CV studies:   The following studies were reviewed today:  Cardiac MRI, event monitor  Labs/Other Tests and Data Reviewed:     EKG:  No ECG reviewed.  Recent Labs: 03/08/2018: TSH 2.780 04/13/2018: BUN 19; Hemoglobin 13.4; Platelets 215; Potassium 3.5; Sodium 139 06/23/2018: Creatinine, Ser 0.94   Recent Lipid Panel No results found for: CHOL, TRIG, HDL, CHOLHDL, LDLCALC, LDLDIRECT  Wt Readings from Last 3 Encounters:  10/11/18 176 lb (79.8 kg)  06/03/18 173 lb 12.8 oz (78.8 kg)  04/12/18 178 lb (80.7 kg)     Objective:    Vital Signs:  BP (!) 173/83   Pulse (!) 57   Ht 5\' 9"  (1.753 m)   Wt 176 lb (79.8 kg)   BMI 25.99 kg/m    VITAL SIGNS:  reviewed a full physical exam was not performed today since this was a virtual telemedicine phone visit  ASSESSMENT & PLAN:    1. Nonischemic cardiomyopathy- history of nonischemic cardiomyopathy with cardiac catheterization revealed normal coronary arteries and EF of 35%) by 2D echo.  She really has minimal symptoms.  She is on low-dose Benicar and was on potassium repletion because of hypokalemia.  Cardiac MRI was performed that showed no evidence of myocarditis and an event monitor was normal as well. 2. Essential hypertension- history of essential hypertension with blood pressure measured today at home by the patient of 160/72 with a pulse of 56.  She is on low-dose Benicar and amlodipine 3. Left bundle branch block-chronic  COVID-19 Education: The signs and symptoms of COVID-19 were discussed with the patient and how to seek care for testing (follow up with PCP or arrange E-visit).  The importance of social distancing was discussed today.  Time:   Today, I have spent 10 minutes with the patient with telehealth technology discussing the above problems.     Medication Adjustments/Labs and Tests Ordered: Current medicines are reviewed at length with the patient today.  Concerns regarding medicines are outlined above.   Tests Ordered: No orders of the defined types were placed in this encounter.   Medication Changes: No orders of the defined types were  placed in this encounter.   Disposition:  Follow up in 6 month(s)  Signed, Quay Burow, MD  10/11/2018 4:12 PM    [facility]

## 2018-10-12 ENCOUNTER — Telehealth: Payer: Self-pay

## 2018-10-12 ENCOUNTER — Telehealth: Payer: Self-pay | Admitting: Family Medicine

## 2018-10-12 NOTE — Telephone Encounter (Signed)
Patient and/or DPR-approved person aware of AVS instructions and verbalized understanding. Letter including After Visit Summary and any other necessary documents to be mailed to the patient's address on file.  

## 2018-10-12 NOTE — Telephone Encounter (Signed)
I called pt as requested today after 3:30 and she was stating that her symptoms has not changed from when she had come into the office and was referred to Dr. Gwenlyn Found the Cardiologist.  She stated that she is still having that slight hurting in her chest it is not a all the time thing and Dr. Gwenlyn Found has put her on potassium twice a day.  And she was reading that her medication olmesartan and potassium taking together can really make your blood pressure drop like it has been.  She was not wanting to make an appointment she is aware that the only way that it can be addressed is with an appointment she stated that she works and does not get off until after 3:00 and would be available after 3:30.  Pt would like to have a call back to discuss the next step please call back after 3:30.  She also wanted Dr. Volanda Napoleon to know that she has a lab appointment to check her lipids with Dr. Gwenlyn Found sometime next month she was not sure the exact date.

## 2018-10-17 ENCOUNTER — Emergency Department (HOSPITAL_COMMUNITY): Payer: Medicare Other

## 2018-10-17 ENCOUNTER — Encounter (HOSPITAL_COMMUNITY): Payer: Self-pay | Admitting: Emergency Medicine

## 2018-10-17 ENCOUNTER — Emergency Department (HOSPITAL_COMMUNITY)
Admission: EM | Admit: 2018-10-17 | Discharge: 2018-10-18 | Disposition: A | Discharge status: Home / Self Care | Payer: Medicare Other | Attending: Emergency Medicine | Admitting: Emergency Medicine

## 2018-10-17 ENCOUNTER — Other Ambulatory Visit: Payer: Self-pay

## 2018-10-17 DIAGNOSIS — R52 Pain, unspecified: Secondary | ICD-10-CM | POA: Diagnosis not present

## 2018-10-17 DIAGNOSIS — R509 Fever, unspecified: Secondary | ICD-10-CM | POA: Insufficient documentation

## 2018-10-17 DIAGNOSIS — I10 Essential (primary) hypertension: Secondary | ICD-10-CM | POA: Insufficient documentation

## 2018-10-17 DIAGNOSIS — Z79899 Other long term (current) drug therapy: Secondary | ICD-10-CM | POA: Diagnosis not present

## 2018-10-17 DIAGNOSIS — E119 Type 2 diabetes mellitus without complications: Secondary | ICD-10-CM | POA: Insufficient documentation

## 2018-10-17 DIAGNOSIS — Z7982 Long term (current) use of aspirin: Secondary | ICD-10-CM | POA: Insufficient documentation

## 2018-10-17 DIAGNOSIS — Z20828 Contact with and (suspected) exposure to other viral communicable diseases: Secondary | ICD-10-CM | POA: Insufficient documentation

## 2018-10-17 LAB — CBC WITH DIFFERENTIAL/PLATELET
Abs Immature Granulocytes: 0.02 10*3/uL (ref 0.00–0.07)
Basophils Absolute: 0 10*3/uL (ref 0.0–0.1)
Basophils Relative: 0 %
Eosinophils Absolute: 0 10*3/uL (ref 0.0–0.5)
Eosinophils Relative: 0 %
HCT: 43.4 % (ref 36.0–46.0)
Hemoglobin: 14.9 g/dL (ref 12.0–15.0)
Immature Granulocytes: 0 %
Lymphocytes Relative: 16 %
Lymphs Abs: 1.6 10*3/uL (ref 0.7–4.0)
MCH: 32.5 pg (ref 26.0–34.0)
MCHC: 34.3 g/dL (ref 30.0–36.0)
MCV: 94.8 fL (ref 80.0–100.0)
Monocytes Absolute: 1.1 10*3/uL — ABNORMAL HIGH (ref 0.1–1.0)
Monocytes Relative: 11 %
Neutro Abs: 7.4 10*3/uL (ref 1.7–7.7)
Neutrophils Relative %: 73 %
Platelets: 269 10*3/uL (ref 150–400)
RBC: 4.58 MIL/uL (ref 3.87–5.11)
RDW: 11.9 % (ref 11.5–15.5)
WBC: 10.1 10*3/uL (ref 4.0–10.5)
nRBC: 0 % (ref 0.0–0.2)

## 2018-10-17 LAB — COMPREHENSIVE METABOLIC PANEL
ALT: 16 U/L (ref 0–44)
AST: 18 U/L (ref 15–41)
Albumin: 4 g/dL (ref 3.5–5.0)
Alkaline Phosphatase: 70 U/L (ref 38–126)
Anion gap: 11 (ref 5–15)
BUN: 12 mg/dL (ref 8–23)
CO2: 22 mmol/L (ref 22–32)
Calcium: 9.4 mg/dL (ref 8.9–10.3)
Chloride: 102 mmol/L (ref 98–111)
Creatinine, Ser: 0.89 mg/dL (ref 0.44–1.00)
GFR calc Af Amer: >60 mL/min (ref >60)
GFR calc non Af Amer: >60 mL/min (ref >60)
Glucose, Bld: 173 mg/dL — ABNORMAL HIGH (ref 70–99)
Potassium: 3.4 mmol/L — ABNORMAL LOW (ref 3.5–5.1)
Sodium: 135 mmol/L (ref 135–145)
Total Bilirubin: 0.9 mg/dL (ref 0.3–1.2)
Total Protein: 7.9 g/dL (ref 6.5–8.1)

## 2018-10-17 LAB — LACTIC ACID, PLASMA: Lactic Acid, Venous: 1.3 mmol/L (ref 0.5–1.9)

## 2018-10-17 LAB — SARS CORONAVIRUS 2 BY RT PCR (HOSPITAL ORDER, PERFORMED IN ~~LOC~~ HOSPITAL LAB): SARS Coronavirus 2: NEGATIVE

## 2018-10-17 MED ORDER — FENTANYL CITRATE (PF) 100 MCG/2ML IJ SOLN
50.0000 ug | Freq: Once | INTRAMUSCULAR | Status: AC
  Administered 2018-10-17: 50 ug via INTRAVENOUS
  Filled 2018-10-17: qty 2

## 2018-10-17 MED ORDER — PROCHLORPERAZINE MALEATE 10 MG PO TABS
10.0000 mg | ORAL_TABLET | Freq: Once | ORAL | Status: AC
  Administered 2018-10-17: 10 mg via ORAL
  Filled 2018-10-17: qty 1

## 2018-10-17 MED ORDER — LACTATED RINGERS IV BOLUS
1000.0000 mL | Freq: Once | INTRAVENOUS | Status: AC
  Administered 2018-10-17: 1000 mL via INTRAVENOUS

## 2018-10-17 MED ORDER — SODIUM CHLORIDE 0.9% FLUSH
3.0000 mL | Freq: Once | INTRAVENOUS | Status: AC
  Administered 2018-10-17: 3 mL via INTRAVENOUS

## 2018-10-17 MED ORDER — IOHEXOL 300 MG/ML  SOLN
100.0000 mL | Freq: Once | INTRAMUSCULAR | Status: AC | PRN
  Administered 2018-10-17: 100 mL via INTRAVENOUS

## 2018-10-17 NOTE — ED Provider Notes (Signed)
[facility] DEPT Provider Note   CSN: [id number] Arrival date & time: 10/17/18  2011    History   Chief Complaint Chief Complaint  Patient presents with  . Generalized Body Aches  . Fever    HPI Angela Pugh is a 73 y.o. female.     The history is provided by the patient.  Fever  Temp source:  Subjective Severity:  Mild Duration:  1 day Timing:  Intermittent Progression:  Waxing and waning Chronicity:  New Relieved by:  Nothing Worsened by:  Nothing Associated symptoms: chills, myalgias and nausea   Associated symptoms: no chest pain, no cough, no dysuria, no ear pain, no rash, no rhinorrhea, no somnolence, no sore throat and no vomiting   Risk factors: no sick contacts     Past Medical History:  Diagnosis Date  . Arthritis    back, knees, shoulder, hands , injections in pelvis, for bone spurs (05/04/2016)  . Chronic back pain   . Diverticulosis   . History of kidney stones   . HLD (hyperlipidemia)   . Hypertension   . Migraine    hx (05/04/2016)  . Peripheral neuropathy   . Radiculopathy   . Sinus complaint   . Sinus headache    occasional (05/04/2016)  . Type II diabetes mellitus (HCC)    diet controlled, no meds now, was on insulin & po treatment at one time    . Vertigo     Patient Active Problem List   Diagnosis Date Noted  . Nonischemic cardiomyopathy ([place]) 04/12/2018  . Chest pain 01/25/2018  . Left bundle branch block 01/25/2018  . Carotid artery disease ([place]) 01/25/2018  . Seasonal allergies 10/18/2017  . Arthritis 10/18/2017  . Pre-diabetes 10/18/2017  . Protein-calorie malnutrition, severe 05/05/2016  . Dysphagia 05/04/2016  . HNP (herniated nucleus pulposus) with myelopathy, cervical 04/17/2016  . Abdominal pain 02/01/2016  . Acute diverticulitis 02/01/2016  . HLD (hyperlipidemia) 02/01/2016  . Diabetes mellitus without complication ([place]) 62/22/9798  . Hypokalemia 02/01/2016  . Hypertension   .  Essential hypertension   . Cervical spondylosis without myelopathy 01/13/2016  . Bilateral carpal tunnel syndrome 01/13/2016  . Herniated lumbar intervertebral disc 06/30/2013    Class: Acute    Past Surgical History:  Procedure Laterality Date  . ANTERIOR CERVICAL DECOMP/DISCECTOMY FUSION N/A 04/17/2016   Procedure: ANTERIOR CERVICAL DECOMPRESSION/DISCECTOMY FUSION CERVICAL THREE - CERVICAL FOUR, POSTERIOR CERVICAL DISCECTOMY CERVICAL SEVEN -THORASIC ONE LEFT;  Surgeon: Ashok Pall, MD;  Location: [facility];  Service: Neurosurgery;  Laterality: N/A;  Anterior/Posterior  . ANTERIOR FUSION CERVICAL SPINE  2000   Dr. Carloyn Manner; "for bone spurs; put hardware in too"  . BACK SURGERY    . LEFT HEART CATH AND CORONARY ANGIOGRAPHY N/A 03/17/2018   Procedure: LEFT HEART CATH AND CORONARY ANGIOGRAPHY;  Surgeon: Lorretta Harp, MD;  Location: [facility];  Service: Cardiovascular;  Laterality: N/A;  . LUMBAR LAMINECTOMY Left 06/30/2013   Procedure: Left L3-4 Extraforaminal approach to excise far lateral herniated nucleus pulposus;  Surgeon: Jessy Oto, MD;  Location: [facility];  Service: Orthopedics;  Laterality: Left;  . TUBAL LIGATION    . VAGINAL HYSTERECTOMY       OB History   No obstetric history on file.      Home Medications    Prior to Admission medications   Medication Sig Start Date End Date Taking? Authorizing Provider  amLODipine (NORVASC) 5 MG tablet Take 1 tablet (5 mg total) by mouth  daily. 05/19/18 08/17/18  Lorretta Harp, MD  aspirin 81 MG EC tablet Take 81 mg by mouth daily. Swallow whole.    [provider]  fluticasone (FLONASE) 50 MCG/ACT nasal spray Place 1 spray into both nostrils daily as needed for allergies or rhinitis.    [provider]  Multiple Vitamins-Minerals (CENTRUM ADULTS PO) Take 1 tablet by mouth daily.     [provider]  olmesartan (BENICAR) 5 MG tablet Take 2 tablets (10 mg total) by mouth daily. 04/21/18   Lorretta Harp,  MD  Omega-3 1000 MG CAPS Take 1,000 mg by mouth daily.     [provider]  triamcinolone cream (KENALOG) 0.5 % Apply 1 application topically 2 (two) times daily as needed (dry skin).  08/20/17   [provider]    Family History Family History  Problem Relation Age of Onset  . Uterine cancer Mother   . Diabetes type II Sister   . Diabetes Mellitus II Brother     Social History Social History   Tobacco Use  . Smoking status: Former Smoker    Packs/day: 0.50    Years: 20.00    Pack years: 10.00    Types: Cigarettes    Last attempt to quit: 06/30/1982    Years since quitting: 36.3  . Smokeless tobacco: Never Used  Substance Use Topics  . Alcohol use: Yes    Comment: 05/04/2016 "might have a drink a couple days/year; might not"  . Drug use: No     Allergies   Acetaminophen; Atorvastatin; and Azithromycin   Review of Systems Review of Systems  Constitutional: Positive for chills and fever.  HENT: Negative for ear pain, rhinorrhea and sore throat.   Eyes: Negative for pain and visual disturbance.  Respiratory: Negative for cough and shortness of breath.   Cardiovascular: Negative for chest pain and palpitations.  Gastrointestinal: Positive for abdominal pain and nausea. Negative for vomiting.  Genitourinary: Negative for dysuria and hematuria.  Musculoskeletal: Positive for myalgias. Negative for arthralgias and back pain.  Skin: Negative for color change and rash.  Neurological: Negative for seizures and syncope.  All other systems reviewed and are negative.    Physical Exam Updated Vital Signs  ED Triage Vitals  Enc Vitals Group     BP 10/17/18 2055 (!) 178/70     Pulse Rate 10/17/18 2055 74     Resp 10/17/18 2055 16     Temp 10/17/18 2055 (!) 100.4 F (38 C)     Temp Source 10/17/18 2055 Oral     SpO2 10/17/18 2055 98 %     Weight 10/17/18 2055 178 lb (80.7 kg)     Height 10/17/18 2055 5\' 9"  (1.753 m)     Head Circumference --      Peak  Flow --      Pain Score 10/17/18 2104 10     Pain Loc --      Pain Edu? --      Excl. in [place]? --     Physical Exam Vitals signs and nursing note reviewed.  Constitutional:      General: She is not in acute distress.    Appearance: She is well-developed. She is not ill-appearing.  HENT:     Head: Normocephalic and atraumatic.     Nose: Nose normal.     Mouth/Throat:     Mouth: Mucous membranes are moist.  Eyes:     Extraocular Movements: Extraocular movements intact.  Conjunctiva/sclera: Conjunctivae normal.     Pupils: Pupils are equal, round, and reactive to light.  Neck:     Musculoskeletal: Normal range of motion and neck supple.  Cardiovascular:     Rate and Rhythm: Normal rate and regular rhythm.     Pulses: Normal pulses.     Heart sounds: Normal heart sounds. No murmur.  Pulmonary:     Effort: Pulmonary effort is normal. No respiratory distress.     Breath sounds: Normal breath sounds.  Abdominal:     General: There is no distension.     Palpations: Abdomen is soft.     Tenderness: There is abdominal tenderness.  Musculoskeletal: Normal range of motion.  Skin:    General: Skin is warm and dry.     Capillary Refill: Capillary refill takes less than 2 seconds.  Neurological:     General: No focal deficit present.     Mental Status: She is alert.  Psychiatric:        Mood and Affect: Mood normal.      ED Treatments / Results  Labs (all labs ordered are listed, but only abnormal results are displayed) Labs Reviewed  COMPREHENSIVE METABOLIC PANEL - Abnormal; Notable for the following components:      Result Value   Potassium 3.4 (*)    Glucose, Bld 173 (*)    All other components within normal limits  CBC WITH DIFFERENTIAL/PLATELET - Abnormal; Notable for the following components:   Monocytes Absolute 1.1 (*)    All other components within normal limits  URINE CULTURE  SARS CORONAVIRUS 2 (HOSPITAL ORDER, [facility])  CULTURE,  BLOOD (ROUTINE X 2)  CULTURE, BLOOD (ROUTINE X 2)  LACTIC ACID, PLASMA  URINALYSIS, ROUTINE W REFLEX MICROSCOPIC    EKG EKG Interpretation  Date/Time:  Monday October 17 2018 20:53:13 EDT Ventricular Rate:  76 PR Interval:    QRS Duration: 153 QT Interval:  424 QTC Calculation: 477 R Axis:   1 Text Interpretation:  Sinus rhythm Left bundle branch block Confirmed by Lennice Sites [phone]) on 10/17/2018 9:44:46 PM   Radiology Dg Chest 2 View  Result Date: 10/17/2018 CLINICAL DATA:  Body aches. EXAM: CHEST - 2 VIEW COMPARISON:  June 23, 2013 FINDINGS: The heart size and mediastinal contours are within normal limits. Both lungs are clear. The visualized skeletal structures are unremarkable. IMPRESSION: No active cardiopulmonary disease. Electronically Signed   By: Dorise Bullion III M.D   On: 10/17/2018 21:32    Procedures Procedures (including critical care time)  Medications Ordered in ED Medications  sodium chloride flush (NS) 0.9 % injection 3 mL (3 mLs Intravenous Given 10/17/18 2234)  prochlorperazine (COMPAZINE) tablet 10 mg (10 mg Oral Given 10/17/18 2235)  lactated ringers bolus 1,000 mL (1,000 mLs Intravenous New Bag/Given 10/17/18 2234)  fentaNYL (SUBLIMAZE) injection 50 mcg (50 mcg Intravenous Given 10/17/18 2236)  iohexol (OMNIPAQUE) 300 MG/ML solution 100 mL (100 mLs Intravenous Contrast Given 10/17/18 2303)     Initial Impression / Assessment and Plan / ED Course  I have reviewed the triage vital signs and the nursing notes.  Pertinent labs & imaging results that were available during my care of the patient were reviewed by me and considered in my medical decision making (see chart for details).     Trinidi Toppins is a 73 year old female history of hypertension, high cholesterol, diabetes who presents the ED with fever, body aches, abdominal pain, emesis.  Patient with low-grade fever 100.4  but otherwise normal vitals.  She denies any cough, shortness of breath, chest  pain.  She has had some abdominal cramping, nausea.  Has some diffuse tenderness to her abdomen on exam.  Denies any urinary symptoms.  Denies any exposures to coronavirus.  Will initiate infectious work-up with urine studies, chest x-ray, CT scan abdomen pelvis, urinalysis, coronavirus testing.  Patient given IV fluids, IV Compazine, IV fentanyl will reevaluate.  Possibly viral source.  No concern for sepsis given fairly unremarkable vitals.  Patient with no leukocytosis.  Normal lactic acid.  No significant electrolyte abnormality, kidney injury.  Chest x-ray showed no signs of pneumonia.  Patient was handed off to oncoming ED staff of patient pending CT scan of the abdomen and pelvis, urine studies to find source for infection.  Patient has normal mental status and no concern for meningitis or intracranial process.  Patient also awaiting coronavirus swab.  Suspect that if labs and images are unremarkable she can follow-up tomorrow outpatient with her primary care doctor.  No signs to suggest occult sepsis at this time.  This chart was dictated using voice recognition software.  Despite best efforts to proofread,  errors can occur which can change the documentation meaning.   Final Clinical Impressions(s) / ED Diagnoses   Final diagnoses:  Fever in adult    ED Discharge Orders    None       Lennice Sites, DO 10/17/18 2320

## 2018-10-17 NOTE — Telephone Encounter (Signed)
Please advise 

## 2018-10-17 NOTE — ED Notes (Signed)
Patient placed on purewick and instructed to notify RN after urinating.

## 2018-10-17 NOTE — ED Notes (Signed)
Patient transported to CT 

## 2018-10-17 NOTE — Telephone Encounter (Signed)
Potassium and olmesartan together do not cause bp to drop.  Pt was started on potassium supplements as her potassium has been low/low normal per chart review of labs.  Pt has an upcoming appt with Cardiology 6/10.  Pt last seen by this provider 12/2018.

## 2018-10-17 NOTE — ED Triage Notes (Signed)
Pt reports having body aches since yesterday that started on left side and moved to right. Pt denies fever at home but is present at time of triage. Pt reporting headache and low appetite.

## 2018-10-17 NOTE — ED Notes (Signed)
Patient is vomiting; provided patient with emesis basin.

## 2018-10-17 NOTE — Telephone Encounter (Signed)
Called pt Angela Pugh with details regarding her concerns, advised pt to call the office for further advise

## 2018-10-17 NOTE — ED Notes (Signed)
Pt has 1 set of blood cultures in main lab if needed, R-AC

## 2018-10-18 LAB — URINALYSIS, ROUTINE W REFLEX MICROSCOPIC
Bilirubin Urine: NEGATIVE
Glucose, UA: NEGATIVE mg/dL
Ketones, ur: NEGATIVE mg/dL
Leukocytes,Ua: NEGATIVE
Nitrite: NEGATIVE
Protein, ur: NEGATIVE mg/dL
Specific Gravity, Urine: 1.031 — ABNORMAL HIGH (ref 1.005–1.030)
pH: 7 (ref 5.0–8.0)

## 2018-10-18 MED ORDER — ONDANSETRON 4 MG PO TBDP
4.0000 mg | ORAL_TABLET | Freq: Once | ORAL | Status: AC
  Administered 2018-10-18: 4 mg via ORAL
  Filled 2018-10-18: qty 1

## 2018-10-18 MED ORDER — OXYCODONE HCL 5 MG PO TABS
5.0000 mg | ORAL_TABLET | Freq: Once | ORAL | Status: AC
  Administered 2018-10-18: 5 mg via ORAL
  Filled 2018-10-18: qty 1

## 2018-10-18 MED ORDER — ONDANSETRON 4 MG PO TBDP: 4.0000 mg | ORAL_TABLET | Freq: Three times a day (TID) | ORAL | 0 refills | Status: DC | PRN

## 2018-10-18 NOTE — Discharge Instructions (Addendum)
You were seen today for fever and abdominal pain.  Your work-up is largely reassuring.  Take Zofran as needed for nausea and vomiting.  Make sure to stay hydrated.  Follow-up closely with your primary physician.

## 2018-10-18 NOTE — ED Provider Notes (Signed)
Patient signed out pending further lab tests and CT scan.  In brief she is a 73 year old female who presents with body aches, fever and some abdominal discomfort.  She is overall nontoxic-appearing on recheck.  She reports some improvement of her symptoms with fluids and pain and nausea medication.  CT scan does not show any evidence of acute intra-abdominal pathology.  Exam remains benign.  Suspect she may have a viral etiology. COVID testing is negative and urinalysis without UTI.  Will discharge home.  Patient is agreement with plan.   Merryl Hacker, MD 10/18/18 [phone]

## 2018-10-18 NOTE — Telephone Encounter (Signed)
Pt is scheduled for an office visit for  f/u on 10/26/2018 at 4 pm

## 2018-10-18 NOTE — Telephone Encounter (Signed)
Spoke with pt states that she visited the ED on 10/17/2018 for fever which pt was tested for COVID-19 and was negative. Pt was advised to follow up with Dr Volanda Napoleon.

## 2018-10-19 LAB — URINE CULTURE: Culture: <10000 — AB

## 2018-10-23 LAB — CULTURE, BLOOD (ROUTINE X 2)
Culture: NO GROWTH
Culture: NO GROWTH
Special Requests: ADEQUATE
Special Requests: ADEQUATE

## 2018-10-26 ENCOUNTER — Encounter (HOSPITAL_COMMUNITY): Payer: Medicare Other | Admitting: Internal Medicine

## 2018-10-26 ENCOUNTER — Encounter: Payer: Self-pay | Admitting: Family Medicine

## 2018-10-26 ENCOUNTER — Ambulatory Visit (INDEPENDENT_AMBULATORY_CARE_PROVIDER_SITE_OTHER): Payer: Medicare Other | Admitting: Family Medicine

## 2018-10-26 ENCOUNTER — Other Ambulatory Visit: Payer: Self-pay

## 2018-10-26 VITALS — BP 146/82 | HR 64 | Temp 98.7°F | Wt 181.6 lb

## 2018-10-26 DIAGNOSIS — L853 Xerosis cutis: Secondary | ICD-10-CM

## 2018-10-26 DIAGNOSIS — I10 Essential (primary) hypertension: Secondary | ICD-10-CM | POA: Diagnosis not present

## 2018-10-26 MED ORDER — TRIAMCINOLONE ACETONIDE 0.5 % EX CREA: 3.0000 "application " | TOPICAL_CREAM | Freq: Two times a day (BID) | CUTANEOUS | 2 refills | Status: DC | PRN

## 2018-10-26 NOTE — Progress Notes (Signed)
Subjective:    Patient ID: Angela Pugh, female    DOB: 1945-11-17, 74 y.o.   MRN: [id number]  No chief complaint on file.   HPI Patient was seen today for ED f/u.  Pt seen on 10/17/18 for fever likely from viral illness.  Pt had negative workup in ED including COVID-19 testing. Labs, CXR, and CT abd and pelvis were negative.  Rapid influenza test was not done.  Since then, pt endorses feeling better. 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   HTN:  Taking norvasc 5 mg.  Pt stopped taking olmesartan after hearing it should not be taken with potassium.  Pt notes episodes of hypotension.  States bp has been 60s/?.  May happen 2-3 x/wk.  Pt has f/u with Dr. Gwenlyn Found, Cardiology coming up.    Pt requesting refill on cream for R hand.  States cannot recall the name of the med.  Pt endorses getting dry, painful areas on fingertips of R hand.  Tries to keep skin moisturized, but hand sanitizer causes increased irritation  Past Medical History:  Diagnosis Date  . Arthritis    back, knees, shoulder, hands , injections in pelvis, for bone spurs (05/04/2016)  . Chronic back pain   . Diverticulosis   . History of kidney stones   . HLD (hyperlipidemia)   . Hypertension   . Migraine    hx (05/04/2016)  . Peripheral neuropathy   . Radiculopathy   . Sinus complaint   . Sinus headache    occasional (05/04/2016)  . Type II diabetes mellitus (HCC)    diet controlled, no meds now, was on insulin & po treatment at one time    . Vertigo     Allergies  Allergen Reactions  . Acetaminophen Hives and Nausea Only  . Atorvastatin Nausea Only    weakness  . Azithromycin Hives and Nausea And Vomiting    ROS General: Denies fever, chills, night sweats, changes in weight, changes in appetite HEENT: Denies headaches, ear pain, changes in vision, rhinorrhea, sore throat CV: Denies CP, palpitations, SOB, orthopnea Pulm: Denies SOB, cough, wheezing GI: Denies abdominal pain, nausea, vomiting, diarrhea,  constipation GU: Denies dysuria, hematuria, frequency, vaginal discharge Msk: Denies muscle cramps, joint pains Neuro: Denies weakness, numbness, tingling Skin: Denies rashes, bruising   +R hand irritaiton Psych: Denies depression, anxiety, hallucinations     Objective:    Blood pressure (!) 146/82, pulse 64, temperature 98.7 F (37.1 C), temperature source Oral, weight 181 lb 9.6 oz (82.4 kg), SpO2 97 %.  Gen. Pleasant, well-nourished, in no distress, normal affect   HEENT: [place]/AT, face symmetric, no scleral icterus, PERRLA, nares patent without drainage, pharynx without erythema or exudate. Neck: No JVD, no thyromegaly, no carotid bruits Lungs: no accessory muscle use, CTAB, no wheezes or rales Cardiovascular: RRR, no m/r/g, no peripheral edema Neuro:  A&Ox3, CN II-XII intact, normal gait Skin:  Warm, no rash.  Plantar surface of fingers of R hand with dry, cracked appearance.   Wt Readings from Last 3 Encounters:  10/17/18 178 lb (80.7 kg)  10/11/18 176 lb (79.8 kg)  06/03/18 173 lb 12.8 oz (78.8 kg)    Lab Results  Component Value Date   WBC 10.1 10/17/2018   HGB 14.9 10/17/2018   HCT 43.4 10/17/2018   PLT 269 10/17/2018   GLUCOSE 173 (H) 10/17/2018   ALT 16 10/17/2018   AST 18 10/17/2018   NA 135 10/17/2018   K 3.4 (L) 10/17/2018  CL 102 10/17/2018   CREATININE 0.89 10/17/2018   BUN 12 10/17/2018   CO2 22 10/17/2018   TSH 2.780 03/08/2018   INR 1.06 01/16/2015   HGBA1C 6.2 (H) 04/14/2016    Assessment/Plan:  Dry skin  - Plan: triamcinolone cream (KENALOG) 0.5 %  Essential hypertension  -elevated -continue norvasc 5 mg -pt encouraged to continue checking bp regularly and keep a log to bring with her to clinic -discussed increasing po intake of water and fluids -pt advised to keep cardiology f/u given episodes of hypotension  F/u prn in the next few months  Grier Mitts, MD

## 2018-10-26 NOTE — Patient Instructions (Signed)
Managing Your Hypertension Hypertension is commonly called high blood pressure. This is when the force of your blood pressing against the walls of your arteries is too strong. Arteries are blood vessels that carry blood from your heart throughout your body. Hypertension forces the heart to work harder to pump blood, and may cause the arteries to become narrow or stiff. Having untreated or uncontrolled hypertension can cause heart attack, stroke, kidney disease, and other problems. What are blood pressure readings? A blood pressure reading consists of a higher number over a lower number. Ideally, your blood pressure should be below 120/80. The first ("top") number is called the systolic pressure. It is a measure of the pressure in your arteries as your heart beats. The second ("bottom") number is called the diastolic pressure. It is a measure of the pressure in your arteries as the heart relaxes. What does my blood pressure reading mean? Blood pressure is classified into four stages. Based on your blood pressure reading, your health care provider may use the following stages to determine what type of treatment you need, if any. Systolic pressure and diastolic pressure are measured in a unit called mm Hg. Normal  Systolic pressure: below 784.  Diastolic pressure: below 80. Elevated  Systolic pressure: 696-295.  Diastolic pressure: below 80. Hypertension stage 1  Systolic pressure: 284-132.  Diastolic pressure: 44-01. Hypertension stage 2  Systolic pressure: 027 or above.  Diastolic pressure: 90 or above. What health risks are associated with hypertension? Managing your hypertension is an important responsibility. Uncontrolled hypertension can lead to:  A heart attack.  A stroke.  A weakened blood vessel (aneurysm).  Heart failure.  Kidney damage.  Eye damage.  Metabolic syndrome.  Memory and concentration problems. What changes can I make to manage my hypertension?  Hypertension can be managed by making lifestyle changes and possibly by taking medicines. Your health care provider will help you make a plan to bring your blood pressure within a normal range. Eating and drinking   Eat a diet that is high in fiber and potassium, and low in salt (sodium), added sugar, and fat. An example eating plan is called the DASH (Dietary Approaches to Stop Hypertension) diet. To eat this way: ? Eat plenty of fresh fruits and vegetables. Try to fill half of your plate at each meal with fruits and vegetables. ? Eat whole grains, such as whole wheat pasta, brown rice, or whole grain bread. Fill about one quarter of your plate with whole grains. ? Eat low-fat diary products. ? Avoid fatty cuts of meat, processed or cured meats, and poultry with skin. Fill about one quarter of your plate with lean proteins such as fish, chicken without skin, beans, eggs, and tofu. ? Avoid premade and processed foods. These tend to be higher in sodium, added sugar, and fat.  Reduce your daily sodium intake. Most people with hypertension should eat less than 1,500 mg of sodium a day.  Limit alcohol intake to no more than 1 drink a day for nonpregnant women and 2 drinks a day for men. One drink equals 12 oz of beer, 5 oz of wine, or 1 oz of hard liquor. Lifestyle  Work with your health care provider to maintain a healthy body weight, or to lose weight. Ask what an ideal weight is for you.  Get at least 30 minutes of exercise that causes your heart to beat faster (aerobic exercise) most days of the week. Activities may include walking, swimming, or biking.  Include exercise  to strengthen your muscles (resistance exercise), such as weight lifting, as part of your weekly exercise routine. Try to do these types of exercises for 30 minutes at least 3 days a week.  Do not use any products that contain nicotine or tobacco, such as cigarettes and e-cigarettes. If you need help quitting, ask your health  care provider.  Control any long-term (chronic) conditions you have, such as high cholesterol or diabetes. Monitoring  Monitor your blood pressure at home as told by your health care provider. Your personal target blood pressure may vary depending on your medical conditions, your age, and other factors.  Have your blood pressure checked regularly, as often as told by your health care provider. Working with your health care provider  Review all the medicines you take with your health care provider because there may be side effects or interactions.  Talk with your health care provider about your diet, exercise habits, and other lifestyle factors that may be contributing to hypertension.  Visit your health care provider regularly. Your health care provider can help you create and adjust your plan for managing hypertension. Will I need medicine to control my blood pressure? Your health care provider may prescribe medicine if lifestyle changes are not enough to get your blood pressure under control, and if:  Your systolic blood pressure is 130 or higher.  Your diastolic blood pressure is 80 or higher. Take medicines only as told by your health care provider. Follow the directions carefully. Blood pressure medicines must be taken as prescribed. The medicine does not work as well when you skip doses. Skipping doses also puts you at risk for problems. Contact a health care provider if:  You think you are having a reaction to medicines you have taken.  You have repeated (recurrent) headaches.  You feel dizzy.  You have swelling in your ankles.  You have trouble with your vision. Get help right away if:  You develop a severe headache or confusion.  You have unusual weakness or numbness, or you feel faint.  You have severe pain in your chest or abdomen.  You vomit repeatedly.  You have trouble breathing. Summary  Hypertension is when the force of blood pumping through your arteries  is too strong. If this condition is not controlled, it may put you at risk for serious complications.  Your personal target blood pressure may vary depending on your medical conditions, your age, and other factors. For most people, a normal blood pressure is less than 120/80.  Hypertension is managed by lifestyle changes, medicines, or both. Lifestyle changes include weight loss, eating a healthy, low-sodium diet, exercising more, and limiting alcohol. This information is not intended to replace advice given to you by your health care provider. Make sure you discuss any questions you have with your health care provider. Document Released: 01/27/2012 Document Revised: 04/01/2016 Document Reviewed: 04/01/2016 Elsevier Interactive Patient Education  2019 [employer] Eczema Dyshidrotic eczema (pompholyx) is a type of eczema that causes very itchy (pruritic), fluid-filled blisters (vesicles) to form on the hands and feet. It can affect people of any age, but is more common before the age of 12. There is no cure, but treatment and certain lifestyle changes can help relieve symptoms. What are the causes? The cause of this condition is not known. What increases the risk? You are more likely to develop this condition if:  You wash your hands frequently.  You have a personal history or family history of eczema, allergies,  asthma, or hay fever.  You are allergic to metals such as nickel or cobalt.  You work with cement.  You smoke. What are the signs or symptoms? Symptoms of this condition may affect the hands, feet, or both. Symptoms may come and go (recur), and may include:  Severe itching, which may happen before blisters appear.  Blisters. These may form suddenly. ? In the early stages, blisters may form near the fingertips. ? In severe cases, blisters may grow to large blister masses (bullae). ? Blisters resolve in 2-3 weeks without bursting. This is followed by a dry phase  in which itching eases.  Pain and swelling.  Cracks or long, narrow openings (fissures) in the skin.  Severe dryness.  Ridges on the nails. How is this diagnosed? This condition may be diagnosed based on:  A physical exam.  Your symptoms.  Your medical history.  Skin scrapings to rule out a fungal infection.  Testing a swab of fluid for bacteria (culture).  Removing and checking a small piece of skin (biopsy) in order to test for infection or to rule out other conditions.  Skin patch tests. These tests involve taking patches that contain possible allergens and placing them on your back. Your health care provider will wait a few days and then check to see if an allergic reaction occurred. These tests may be done if your health care provider suspects allergic reactions, or to rule out other types of eczema. You may be referred to a health care provider who specializes in the skin (dermatologist) to help diagnose and treat this condition. How is this treated? There is no cure for this condition, but treatment can help relieve symptoms. Depending on how many blisters you have and how severe they are, your health care provider may suggest:  Avoiding allergens, irritants, or triggers that worsen symptoms. This may involve lifestyle changes such as: ? Using different lotions or soaps. ? Avoiding hot weather or places that will cause you to sweat a lot. ? Managing stress with coping techniques such as relaxation and exercise, and asking for help when you need it. ? Diet changes as recommended by your health care provider.  Using a clean, damp towel (cool compress) to relieve symptoms.  Soaking in a bath that contains a type of salt that relieves irritation (aluminum acetate soaks).  Medicine taken by mouth to reduce itching (oral antihistamines).  Medicine applied to the skin to reduce swelling and irritation (topical corticosteroids).  Medicine that reduces the activity of the  body's disease-fighting system (immunosuppressants) to treat inflammation. This may be given in severe cases.  Antibiotic medicines to treat bacterial infection.  Light therapy (phototherapy). This involves shining ultraviolet (UV) light on affected skin in order to reduce itchiness and inflammation. Follow these instructions at home: Bathing and skin care   Wash skin gently. After bathing or washing your hands, pat your skin dry. Avoid rubbing your skin.  Remove all jewelry before bathing. If the skin under the jewelry stays wet, blisters may form or get worse.  Apply cool compresses as told by your health care provider: ? Soak a clean towel in cool water. ? Wring out excess water until towel is damp. ? Place the towel over affected skin. Leave the towel on for 20 minutes at a time, 2-3 times a day.  Use mild soaps, cleansers, and lotions that do not contain dyes, perfumes, or other irritants.  Keep your skin hydrated. To do this: ? Avoid very hot water. Take  lukewarm baths or showers. ? Apply moisturizer within three minutes of bathing. This locks in moisture. Medicines  Take and apply over-the-counter and prescription medicines only as told by your health care provider.  If you were prescribed antibiotic medicine, take or apply it as told by your health care provider. Do not stop using the antibiotic even if you start to feel better. General instructions  Identify and avoid triggers and allergens.  Keep fingernails short to avoid breaking open the skin while scratching.  Use waterproof gloves to protect your hands when doing work that keeps your hands wet for a long time.  Wear socks to keep your feet dry.  Do not use any products that contain nicotine or tobacco, such as cigarettes and e-cigarettes. If you need help quitting, ask your health care provider.  Keep all follow-up visits as told by your health care provider. This is important. Contact a health care provider  if:  You have symptoms that do not go away.  You have signs of infection, such as: ? Crusting, pus, or a bad smell. ? More redness, swelling, or pain. ? Increased warmth in the affected area. Summary  Dyshidrotic eczema (pompholyx) is a type of eczema that causes very itchy (pruritic), fluid-filled blisters (vesicles) to form on the hands and feet.  The cause of this condition is not known.  There is no cure for this condition, but treatment can help relieve symptoms. Treatment depends on how many blisters you have and how severe they are.  Use mild soaps, cleansers, and lotions that do not contain dyes, perfumes, or other irritants. Keep your skin hydrated. This information is not intended to replace advice given to you by your health care provider. Make sure you discuss any questions you have with your health care provider. Document Released: 09/17/2016 Document Revised: 09/17/2016 Document Reviewed: 09/17/2016 Elsevier Interactive Patient Education  2019 [employer].

## 2018-10-27 ENCOUNTER — Encounter: Payer: Self-pay | Admitting: Family Medicine

## 2018-11-03 ENCOUNTER — Ambulatory Visit: Payer: Medicare Other

## 2018-11-03 LAB — LIPID PANEL
Chol/HDL Ratio: 4 ratio (ref 0.0–4.4)
Cholesterol, Total: 247 mg/dL — ABNORMAL HIGH (ref 100–199)
HDL: 61 mg/dL (ref >39)
LDL Calculated: 166 mg/dL — ABNORMAL HIGH (ref 0–99)
Triglycerides: 100 mg/dL (ref 0–149)
VLDL Cholesterol Cal: 20 mg/dL (ref 5–40)

## 2018-11-03 LAB — HEPATIC FUNCTION PANEL
ALT: 13 IU/L (ref 0–32)
AST: 13 IU/L (ref 0–40)
Albumin: 4.3 g/dL (ref 3.7–4.7)
Alkaline Phosphatase: 74 IU/L (ref 39–117)
Bilirubin Total: <0.2 mg/dL (ref 0.0–1.2)
Bilirubin, Direct: 0.07 mg/dL (ref 0.00–0.40)
Total Protein: 7.2 g/dL (ref 6.0–8.5)

## 2018-11-03 LAB — BASIC METABOLIC PANEL
BUN/Creatinine Ratio: 14 (ref 12–28)
BUN: 13 mg/dL (ref 8–27)
CO2: 25 mmol/L (ref 20–29)
Calcium: 9.9 mg/dL (ref 8.7–10.3)
Chloride: 101 mmol/L (ref 96–106)
Creatinine, Ser: 0.96 mg/dL (ref 0.57–1.00)
GFR calc Af Amer: 68 mL/min/{1.73_m2} (ref >59)
GFR calc non Af Amer: 59 mL/min/{1.73_m2} — ABNORMAL LOW (ref >59)
Glucose: 115 mg/dL — ABNORMAL HIGH (ref 65–99)
Potassium: 4.4 mmol/L (ref 3.5–5.2)
Sodium: 142 mmol/L (ref 134–144)

## 2018-11-07 ENCOUNTER — Telehealth: Payer: Self-pay | Admitting: Family Medicine

## 2018-11-07 NOTE — Telephone Encounter (Signed)
General/Other - Cyst  The patient has a new cyst under the right are and she is not sure what to do. The patient also stated that her [place] [facility] called asking why she was not on any cholesterol medication. Please call her back on her mobile number [phone]

## 2018-11-07 NOTE — Telephone Encounter (Signed)
Pt should make an appt to be seen in clinic for the cyst.  Pt's cholesterol checked on 6/18 by her Cardiologist who advised she start Crestor 5 mg.  Unsure if that message has been relayed to pt yet.

## 2018-11-07 NOTE — Telephone Encounter (Signed)
Please advise 

## 2018-11-09 ENCOUNTER — Other Ambulatory Visit: Payer: Self-pay

## 2018-11-09 ENCOUNTER — Telehealth: Payer: Self-pay

## 2018-11-09 ENCOUNTER — Encounter: Payer: Self-pay | Admitting: Family Medicine

## 2018-11-09 ENCOUNTER — Ambulatory Visit (INDEPENDENT_AMBULATORY_CARE_PROVIDER_SITE_OTHER): Payer: Medicare Other | Admitting: Family Medicine

## 2018-11-09 VITALS — BP 140/70 | HR 66 | Temp 98.4°F | Wt 180.0 lb

## 2018-11-09 DIAGNOSIS — L089 Local infection of the skin and subcutaneous tissue, unspecified: Secondary | ICD-10-CM

## 2018-11-09 DIAGNOSIS — L723 Sebaceous cyst: Secondary | ICD-10-CM | POA: Diagnosis not present

## 2018-11-09 MED ORDER — DOXYCYCLINE HYCLATE 100 MG PO TABS: 100.0000 mg | ORAL_TABLET | Freq: Two times a day (BID) | ORAL | 0 refills | Status: AC

## 2018-11-09 NOTE — Patient Instructions (Signed)
Incision and Drainage of a Pilonidal Cyst, Care After This sheet gives you information about how to care for yourself after your procedure. Your health care provider may also give you more specific instructions. If you have problems or questions, contact your health care provider. What can I expect after the procedure? After the procedure, it is common to have:  Pain that gets better when you take medicine.  Some fluid or blood coming from your wound. Follow these instructions at home: Medicines  Take over-the-counter and prescription medicines only as told by your health care provider.  If you were prescribed an antibiotic medicine, take it as told by your health care provider. Do not stop taking the antibiotic even if you start to feel better. Lifestyle  Do not do activities that irritate or put pressure on your buttocks for about 2 weeks, or as long as told by your health care provider. These activities include bike riding, running, and anything that involves a twisting motion.  Do not sit for long periods at a time without getting up to move around.  Sleep on your side instead of your back.  Avoid wearing tight underwear and tight pants. Bathing  Do not take baths or showers, swim, or use a hot tub until your health care provider approves. This depends on the type of wound you have from surgery.  While bathing, clean your buttocks area gently with soap and water.  After bathing: ? Pat the area dry with a soft, clean towel. ? Cover the area with a clean bandage (dressing), if told to by your health care provider. General instructions   If you are taking prescription pain medicine, take actions to prevent or treat constipation. Your health care provider may recommend that you: ? Drink enough fluid to keep your urine pale yellow. ? Eat foods that are high in fiber, such as fresh fruits and vegetables, whole grains, and beans. ? Limit foods that are high in fat and processed  sugars, such as fried or sweet foods. ? Take an over-the-counter or prescription medicine for constipation.  You will need to have a caregiver help you manage wound care and dressing changes. Your caregiver should: ? Wash his or her hands with soap and water before changing your dressing. If soap and water are not available, your caregiver should use hand sanitizer. ? Check your wound every day for signs of infection, such as:  Redness, swelling, or more pain.  More fluid or blood.  Warmth.  Pus or a bad smell. ? Follow any additional instructions from your health care provider on how to care for your wound, such as wound cleaning, wound flushing (irrigation), or packing your wound with a dressing.  Keep all follow-up visits as told by your health care provider. This is important. If you had incision and drainage with wound packing:  Return to your health care provider as instructed to have your packing material changed or removed.  Keep the area dry until your packing has been removed.  After the packing has been removed, you may start taking showers. If you had marsupialization:  You may start taking showers the day after surgery, or when your health care provider approves.  Remove your dressing before you shower, but let the water from the shower moisten your dressing before you remove it. This will make it easier to remove.  Ask your health care provider when you can stop using a dressing. If you had incision and drainage without wound packing:  Change your dressing as directed.  Leave stitches (sutures), skin glue, or adhesive strips in place. These skin closures may need to stay in place for 2 weeks or longer. If adhesive strip edges start to loosen and curl up, you may trim the loose edges. Do not remove adhesive strips completely unless your health care provider tells you to do that. Contact a health care provider if:  You have redness, swelling, or more pain around your  wound.  You have more fluid or blood coming from your wound.  You have new bleeding from your wound.  Your wound feels warm to the touch.  There is pus or a bad smell coming from your wound.  You have pain that does not get better with medicine.  You have a fever or chills.  You have muscle aches.  You are dizzy.  You feel generally sick. Summary  After a procedure to drain a pilonidal cyst, it is common to have some fluid or blood coming from your wound.  If you were prescribed an antibiotic medicine, take it as told by your health care provider. Do not stop taking the antibiotic even if you start to feel better.  Return to your health care provider as instructed to have any packing material changed or removed. This information is not intended to replace advice given to you by your health care provider. Make sure you discuss any questions you have with your health care provider. Document Released: 06/04/2006 Document Revised: 04/26/2017 Document Reviewed: 04/26/2017 Elsevier Interactive Patient Education  2019 [employer].

## 2018-11-09 NOTE — Telephone Encounter (Signed)
Pt is scheduled to see Dr Volanda Napoleon today 11/09/2018 afternoon at 3 pm

## 2018-11-09 NOTE — Progress Notes (Signed)
Subjective:    Patient ID: Angela Pugh, female    DOB: [birth year], 73 y.o.   MRN: [id number]  No chief complaint on file.   HPI Patient seen today for I&D of cyst in R axilla.  Pt states the area started draining last night.  It is painful and limiting her range of motion.  Pt denies fever, chills, nausea, vomiting.  Pt endorses prior history of cyst/abscesses in bilateral armpits.  Past Medical History:  Diagnosis Date  . Arthritis    back, knees, shoulder, hands , injections in pelvis, for bone spurs (05/04/2016)  . Chronic back pain   . Diverticulosis   . History of kidney stones   . HLD (hyperlipidemia)   . Hypertension   . Migraine    hx (05/04/2016)  . Peripheral neuropathy   . Radiculopathy   . Sinus complaint   . Sinus headache    occasional (05/04/2016)  . Type II diabetes mellitus (HCC)    diet controlled, no meds now, was on insulin & po treatment at one time    . Vertigo     Allergies  Allergen Reactions  . Acetaminophen Hives and Nausea Only  . Atorvastatin Nausea Only    weakness  . Azithromycin Hives and Nausea And Vomiting    ROS General: Denies fever, chills, night sweats, changes in weight, changes in appetite HEENT: Denies headaches, ear pain, changes in vision, rhinorrhea, sore throat CV: Denies CP, palpitations, SOB, orthopnea Pulm: Denies SOB, cough, wheezing GI: Denies abdominal pain, nausea, vomiting, diarrhea, constipation GU: Denies dysuria, hematuria, frequency, vaginal discharge Msk: Denies muscle cramps, joint pains Neuro: Denies weakness, numbness, tingling Skin: Denies rashes, bruising  +cyst in R axilla Psych: Denies depression, anxiety, hallucinations     Objective:    Blood pressure 140/70, pulse 66, temperature 98.4 F (36.9 C), temperature source Oral, weight 180 lb (81.6 kg), SpO2 98 %.   Gen. Pleasant, well-nourished, in no distress, normal affect  HEENT: [place]/AT, face symmetric, no scleral icterus, PERRLA, nares  patent without drainage Lungs: no accessory muscle use, CTAB, no wheezes or rales Cardiovascular: RRR, no m/r/g, no peripheral edema Abdomen: BS present, soft, NT/ND, no hepatosplenomegaly. Musculoskeletal: No deformities, no cyanosis or clubbing, normal tone Neuro:  A&Ox3, CN II-XII intact, normal gait Skin:  Warm, no rash   +R axilla with 3 cm x 2 cm fluctuant mass with mild purulent drainage and dark debris noted in a pore on the posterior side of the mass.   Wt Readings from Last 3 Encounters:  11/09/18 180 lb (81.6 kg)  10/26/18 181 lb 9.6 oz (82.4 kg)  10/17/18 178 lb (80.7 kg)    Lab Results  Component Value Date   WBC 10.1 10/17/2018   HGB 14.9 10/17/2018   HCT 43.4 10/17/2018   PLT 269 10/17/2018   GLUCOSE 115 (H) 11/03/2018   CHOL 247 (H) 11/03/2018   TRIG 100 11/03/2018   HDL 61 11/03/2018   LDLCALC 166 (H) 11/03/2018   ALT 13 11/03/2018   AST 13 11/03/2018   NA 142 11/03/2018   K 4.4 11/03/2018   CL 101 11/03/2018   CREATININE 0.96 11/03/2018   BUN 13 11/03/2018   CO2 25 11/03/2018   TSH 2.780 03/08/2018   INR 1.06 01/16/2015   HGBA1C 6.2 (H) 04/14/2016    Incision and Drainage Procedure Note  Pre-operative Diagnosis: cyst  Post-operative Diagnosis: same  Indications: infected cyst  Anesthesia: 1% plain lidocaine  Procedure Details  The procedure, risks and  complications have been discussed in detail (including, but not limited to airway compromise, infection, bleeding) with the patient, and the patient has signed consent to the procedure.  The skin was sterilely prepped and draped over the affected area in the usual fashion. After adequate local anesthesia, I&D with a #15 blade was performed on the right Axilla. Purulent drainage: present and keratin plug expressed from area. The patient was observed until stable.  EBL: minimal  Condition: Tolerated procedure well and Stable   Complications: none.   Assessment/Plan:  Infected sebaceous  cyst of skin -given handout -given precautions - Plan: doxycycline (VIBRA-TABS) 100 MG tablet  F/u prn  Grier Mitts, MD

## 2018-11-09 NOTE — Telephone Encounter (Signed)
Copied from [place] [id number]. Topic: Appointment Scheduling - Scheduling Inquiry for Clinic >> Nov 08, 2018  4:12 PM Rutherford Nail, [place] wrote: Reason for CRM: Patient returning call to [facility] regarding making an appointment for a cyst under her arm. States that she would like for the appointment to be after 3pm tomorrow, if possible, due to having to work. Please advise.  CB#: [phone]

## 2018-11-10 ENCOUNTER — Encounter: Payer: Self-pay | Admitting: Family Medicine

## 2018-11-10 NOTE — Telephone Encounter (Signed)
Pt seen in the office on 11/09/2018

## 2018-11-17 ENCOUNTER — Other Ambulatory Visit: Payer: Self-pay

## 2018-11-17 ENCOUNTER — Ambulatory Visit (INDEPENDENT_AMBULATORY_CARE_PROVIDER_SITE_OTHER): Payer: Medicare Other | Admitting: Podiatry

## 2018-11-17 VITALS — Temp 97.3°F

## 2018-11-17 DIAGNOSIS — Q828 Other specified congenital malformations of skin: Secondary | ICD-10-CM

## 2018-11-17 DIAGNOSIS — E1169 Type 2 diabetes mellitus with other specified complication: Secondary | ICD-10-CM

## 2018-11-17 DIAGNOSIS — E1151 Type 2 diabetes mellitus with diabetic peripheral angiopathy without gangrene: Secondary | ICD-10-CM | POA: Diagnosis not present

## 2018-11-17 DIAGNOSIS — M2041 Other hammer toe(s) (acquired), right foot: Secondary | ICD-10-CM

## 2018-11-17 DIAGNOSIS — B351 Tinea unguium: Secondary | ICD-10-CM

## 2018-11-17 DIAGNOSIS — M21961 Unspecified acquired deformity of right lower leg: Secondary | ICD-10-CM

## 2018-11-17 DIAGNOSIS — M21962 Unspecified acquired deformity of left lower leg: Secondary | ICD-10-CM

## 2018-11-23 ENCOUNTER — Other Ambulatory Visit: Payer: Self-pay

## 2018-11-23 ENCOUNTER — Ambulatory Visit: Payer: Medicare Other | Admitting: Orthotics

## 2018-11-23 DIAGNOSIS — Q828 Other specified congenital malformations of skin: Secondary | ICD-10-CM

## 2018-11-23 DIAGNOSIS — E119 Type 2 diabetes mellitus without complications: Secondary | ICD-10-CM

## 2018-11-23 DIAGNOSIS — M2041 Other hammer toe(s) (acquired), right foot: Secondary | ICD-10-CM

## 2018-11-23 DIAGNOSIS — M6788 Other specified disorders of synovium and tendon, other site: Secondary | ICD-10-CM

## 2018-11-23 NOTE — Progress Notes (Signed)

## 2018-11-24 NOTE — Telephone Encounter (Signed)
  Waiting on pt order from [facility]     Copied from [place] [id number]. Topic: General - Other >> Nov 24, 2018  1:29 PM Ivar Drape wrote: Reason for CRM:   Patient went to [facility] and they are sending an order for Dr. Grier Mitts signature so they can order the patient's shoes.  Please notify patient when the order is signed and sent back to [place].

## 2018-12-19 NOTE — Progress Notes (Signed)
Subjective:  Patient ID: Angela Pugh, female    DOB: [birth year],  MRN: [id number]  Chief Complaint  Patient presents with  . Nail Problem    Nail trim 1-5 bilateral  . Callouses    Bilateral plantar callouses   73 y.o. female presents with the above complaint.  Having pain from the calluses and requesting care of her nails today.  Review of Systems: Negative except as noted in the HPI. Denies N/V/F/Ch.  Past Medical History:  Diagnosis Date  . Arthritis    back, knees, shoulder, hands , injections in pelvis, for bone spurs (05/04/2016)  . Chronic back pain   . Diverticulosis   . History of kidney stones   . HLD (hyperlipidemia)   . Hypertension   . Migraine    hx (05/04/2016)  . Peripheral neuropathy   . Radiculopathy   . Sinus complaint   . Sinus headache    occasional (05/04/2016)  . Type II diabetes mellitus (HCC)    diet controlled, no meds now, was on insulin & po treatment at one time    . Vertigo     Current Outpatient Medications:  .  aspirin 81 MG EC tablet, Take 81 mg by mouth daily. Swallow whole., Disp: , Rfl:  .  fluticasone (FLONASE) 50 MCG/ACT nasal spray, Place 1 spray into both nostrils daily as needed for allergies or rhinitis., Disp: , Rfl:  .  Multiple Vitamins-Minerals (CENTRUM ADULTS PO), Take 1 tablet by mouth daily. , Disp: , Rfl:  .  olmesartan (BENICAR) 5 MG tablet, Take 2 tablets (10 mg total) by mouth daily., Disp: 180 tablet, Rfl: 1 .  Omega-3 1000 MG CAPS, Take 1,000 mg by mouth daily. , Disp: , Rfl:  .  ondansetron (ZOFRAN ODT) 4 MG disintegrating tablet, Take 1 tablet (4 mg total) by mouth every 8 (eight) hours as needed for nausea or vomiting., Disp: 20 tablet, Rfl: 0 .  triamcinolone cream (KENALOG) 0.5 %, Apply 3 application topically 2 (two) times daily as needed (dry skin)., Disp: 30 g, Rfl: 2 .  amLODipine (NORVASC) 5 MG tablet, Take 1 tablet (5 mg total) by mouth daily., Disp: 90 tablet, Rfl: 3  Social History   Tobacco Use   Smoking Status Former Smoker  . Packs/day: 0.50  . Years: 20.00  . Pack years: 10.00  . Types: Cigarettes  . Quit date: 06/30/1982  . Years since quitting: 36.4  Smokeless Tobacco Never Used    Allergies  Allergen Reactions  . Acetaminophen Hives and Nausea Only  . Atorvastatin Nausea Only    weakness  . Azithromycin Hives and Nausea And Vomiting   Objective:   Vitals:   11/17/18 1532  Temp: (!) 97.3 F (36.3 C)   There is no height or weight on file to calculate BMI. Constitutional Well developed. Well nourished.  Vascular Dorsalis pedis pulses palpable bilaterally. Posterior tibial pulses non-palpable bilaterally. Capillary refill normal to all digits.  No cyanosis or clubbing noted. Pedal hair growth normal.  Neurologic Normal speech. Oriented to person, place, and time. Epicritic sensation to light touch grossly present bilaterally.  Dermatologic Nails elongated, thickened dystrophic x10 with pain to palpation. No open wounds. HPKs sub hallux bilat, submet 2 bilat, right heel, right 5th toe.  Orthopedic: Normal joint ROM without pain or crepitus bilaterally. No visible deformities. No bony tenderness. POP 2nd MPJ dorsally bilat. POP R 5th toe with adductovarus deformity.   Radiographs: None today Assessment:   1. Onychomycosis of multiple toenails  with type 2 diabetes mellitus and peripheral angiopathy ([place])   2. Porokeratosis   3. Deformity of metatarsal bone of left foot   4. Metatarsal deformity, right   5. Hammer toe of right foot    Plan:  Patient was evaluated and treated and all questions answered.  Onychomycosis with pain -Nails debrided x10 -Would benefit from DM shoes d/t decreased blood flow and multiple deformities   Procedure: Nail Debridement Rationale: Patient meets criteria for routine foot care due to PAD Type of Debridement: manual, sharp debridement. Instrumentation: Nail nipper, rotary burr. Number of Nails: 10     Porokeratoses -Debrided x4  Procedure: Paring of Lesion Rationale: painful hyperkeratotic lesion Type of Debridement: manual, sharp debridement. Instrumentation: 312 blade Number of Lesions: 4   Return in about 3 months (around 02/17/2019) for Diabetic Foot Care.

## 2018-12-22 ENCOUNTER — Other Ambulatory Visit: Payer: Self-pay | Admitting: Cardiovascular Disease

## 2019-01-03 ENCOUNTER — Encounter: Payer: Self-pay | Admitting: Family Medicine

## 2019-01-04 LAB — HM DIABETES EYE EXAM

## 2019-01-11 ENCOUNTER — Telehealth: Payer: Self-pay

## 2019-01-11 NOTE — Telephone Encounter (Signed)
Pt Therapeutic Shoes form was completed and faxed to [facility]. Copy sent to scanning

## 2019-01-18 ENCOUNTER — Encounter: Payer: Self-pay | Admitting: *Deleted

## 2019-02-09 ENCOUNTER — Other Ambulatory Visit: Payer: Self-pay | Admitting: Family Medicine

## 2019-02-09 DIAGNOSIS — Z1231 Encounter for screening mammogram for malignant neoplasm of breast: Secondary | ICD-10-CM

## 2019-02-16 ENCOUNTER — Other Ambulatory Visit: Payer: Self-pay

## 2019-02-16 ENCOUNTER — Encounter: Payer: Self-pay | Admitting: Family Medicine

## 2019-02-16 ENCOUNTER — Ambulatory Visit (INDEPENDENT_AMBULATORY_CARE_PROVIDER_SITE_OTHER): Payer: Medicare Other | Admitting: Family Medicine

## 2019-02-16 VITALS — BP 156/80 | HR 68 | Temp 96.4°F | Wt 181.0 lb

## 2019-02-16 DIAGNOSIS — E782 Mixed hyperlipidemia: Secondary | ICD-10-CM | POA: Diagnosis not present

## 2019-02-16 DIAGNOSIS — Z0000 Encounter for general adult medical examination without abnormal findings: Secondary | ICD-10-CM | POA: Diagnosis not present

## 2019-02-16 DIAGNOSIS — R7303 Prediabetes: Secondary | ICD-10-CM

## 2019-02-16 DIAGNOSIS — L853 Xerosis cutis: Secondary | ICD-10-CM

## 2019-02-16 DIAGNOSIS — Z780 Asymptomatic menopausal state: Secondary | ICD-10-CM

## 2019-02-16 DIAGNOSIS — I10 Essential (primary) hypertension: Secondary | ICD-10-CM | POA: Diagnosis not present

## 2019-02-16 DIAGNOSIS — L659 Nonscarring hair loss, unspecified: Secondary | ICD-10-CM

## 2019-02-16 LAB — CBC WITH DIFFERENTIAL/PLATELET
Basophils Absolute: 0 10*3/uL (ref 0.0–0.1)
Basophils Relative: 0.6 % (ref 0.0–3.0)
Eosinophils Absolute: 0.1 10*3/uL (ref 0.0–0.7)
Eosinophils Relative: 1.8 % (ref 0.0–5.0)
HCT: 46 % (ref 36.0–46.0)
Hemoglobin: 15.5 g/dL — ABNORMAL HIGH (ref 12.0–15.0)
Lymphocytes Relative: 44.4 % (ref 12.0–46.0)
Lymphs Abs: 2.8 10*3/uL (ref 0.7–4.0)
MCHC: 33.8 g/dL (ref 30.0–36.0)
MCV: 95.3 fl (ref 78.0–100.0)
Monocytes Absolute: 0.6 10*3/uL (ref 0.1–1.0)
Monocytes Relative: 9.7 % (ref 3.0–12.0)
Neutro Abs: 2.7 10*3/uL (ref 1.4–7.7)
Neutrophils Relative %: 43.5 % (ref 43.0–77.0)
Platelets: 319 10*3/uL (ref 150.0–400.0)
RBC: 4.83 Mil/uL (ref 3.87–5.11)
RDW: 13.2 % (ref 11.5–15.5)
WBC: 6.3 10*3/uL (ref 4.0–10.5)

## 2019-02-16 LAB — BASIC METABOLIC PANEL
BUN: 18 mg/dL (ref 6–23)
CO2: 27 mEq/L (ref 19–32)
Calcium: 10.1 mg/dL (ref 8.4–10.5)
Chloride: 102 mEq/L (ref 96–112)
Creatinine, Ser: 0.95 mg/dL (ref 0.40–1.20)
GFR: 69.87 mL/min (ref >60.00)
Glucose, Bld: 132 mg/dL — ABNORMAL HIGH (ref 70–99)
Potassium: 4.1 mEq/L (ref 3.5–5.1)
Sodium: 140 mEq/L (ref 135–145)

## 2019-02-16 LAB — LIPID PANEL
Cholesterol: 273 mg/dL — ABNORMAL HIGH (ref 0–200)
HDL: 59 mg/dL (ref >39.00)
LDL Cholesterol: 190 mg/dL — ABNORMAL HIGH (ref 0–99)
NonHDL: 213.78
Total CHOL/HDL Ratio: 5
Triglycerides: 121 mg/dL (ref 0.0–149.0)
VLDL: 24.2 mg/dL (ref 0.0–40.0)

## 2019-02-16 LAB — T4, FREE: Free T4: 0.91 ng/dL (ref 0.60–1.60)

## 2019-02-16 LAB — HEMOGLOBIN A1C: Hgb A1c MFr Bld: 6.7 % — ABNORMAL HIGH (ref 4.6–6.5)

## 2019-02-16 LAB — TSH: TSH: 1.03 u[IU]/mL (ref 0.35–4.50)

## 2019-02-16 MED ORDER — TRIAMCINOLONE ACETONIDE 0.5 % EX CREA: 3.0000 "application " | TOPICAL_CREAM | Freq: Two times a day (BID) | CUTANEOUS | 3 refills | Status: DC | PRN

## 2019-02-16 NOTE — Patient Instructions (Addendum)
[place] can help with your hot flashes.  It can be found over the counter at the drug store.  Preventive Care 31 Years and Older, Female Preventive care refers to lifestyle choices and visits with your health care provider that can promote health and wellness. This includes:  A yearly physical exam. This is also called an annual well check.  Regular dental and eye exams.  Immunizations.  Screening for certain conditions.  Healthy lifestyle choices, such as diet and exercise. What can I expect for my preventive care visit? Physical exam Your health care provider will check:  Height and weight. These may be used to calculate body mass index (BMI), which is a measurement that tells if you are at a healthy weight.  Heart rate and blood pressure.  Your skin for abnormal spots. Counseling Your health care provider may ask you questions about:  Alcohol, tobacco, and drug use.  Emotional well-being.  Home and relationship well-being.  Sexual activity.  Eating habits.  History of falls.  Memory and ability to understand (cognition).  Work and work Statistician.  Pregnancy and menstrual history. What immunizations do I need?  Influenza (flu) vaccine  This is recommended every year. Tetanus, diphtheria, and pertussis (Tdap) vaccine  You may need a Td booster every 10 years. Varicella (chickenpox) vaccine  You may need this vaccine if you have not already been vaccinated. Zoster (shingles) vaccine  You may need this after age 30. Pneumococcal conjugate (PCV13) vaccine  One dose is recommended after age 84. Pneumococcal polysaccharide (PPSV23) vaccine  One dose is recommended after age 94. Measles, mumps, and rubella (MMR) vaccine  You may need at least one dose of MMR if you were born in 1957 or later. You may also need a second dose. Meningococcal conjugate (MenACWY) vaccine  You may need this if you have certain conditions. Hepatitis A vaccine  You may  need this if you have certain conditions or if you travel or work in places where you may be exposed to hepatitis A. Hepatitis B vaccine  You may need this if you have certain conditions or if you travel or work in places where you may be exposed to hepatitis B. Haemophilus influenzae type b (Hib) vaccine  You may need this if you have certain conditions. You may receive vaccines as individual doses or as more than one vaccine together in one shot (combination vaccines). Talk with your health care provider about the risks and benefits of combination vaccines. What tests do I need? Blood tests  Lipid and cholesterol levels. These may be checked every 5 years, or more frequently depending on your overall health.  Hepatitis C test.  Hepatitis B test. Screening  Lung cancer screening. You may have this screening every year starting at age 37 if you have a 30-pack-year history of smoking and currently smoke or have quit within the past 15 years.  Colorectal cancer screening. All adults should have this screening starting at age 86 and continuing until age 43. Your health care provider may recommend screening at age 64 if you are at increased risk. You will have tests every 1-10 years, depending on your results and the type of screening test.  Diabetes screening. This is done by checking your blood sugar (glucose) after you have not eaten for a while (fasting). You may have this done every 1-3 years.  Mammogram. This may be done every 1-2 years. Talk with your health care provider about how often you should have regular mammograms.  BRCA-related cancer screening. This may be done if you have a family history of breast, ovarian, tubal, or peritoneal cancers. Other tests  Sexually transmitted disease (STD) testing.  Bone density scan. This is done to screen for osteoporosis. You may have this done starting at age 75. Follow these instructions at home: Eating and drinking  Eat a diet that  includes fresh fruits and vegetables, whole grains, lean protein, and low-fat dairy products. Limit your intake of foods with high amounts of sugar, saturated fats, and salt.  Take vitamin and mineral supplements as recommended by your health care provider.  Do not drink alcohol if your health care provider tells you not to drink.  If you drink alcohol: ? Limit how much you have to 0-1 drink a day. ? Be aware of how much alcohol is in your drink. In the [place] one drink equals one 12 oz bottle of beer (355 mL), one 5 oz glass of wine (148 mL), or one 1 oz glass of hard liquor (44 mL). Lifestyle  Take daily care of your teeth and gums.  Stay active. Exercise for at least 30 minutes on 5 or more days each week.  Do not use any products that contain nicotine or tobacco, such as cigarettes, e-cigarettes, and chewing tobacco. If you need help quitting, ask your health care provider.  If you are sexually active, practice safe sex. Use a condom or other form of protection in order to prevent STIs (sexually transmitted infections).  Talk with your health care provider about taking a low-dose aspirin or statin. What's next?  Go to your health care provider once a year for a well check visit.  Ask your health care provider how often you should have your eyes and teeth checked.  Stay up to date on all vaccines. This information is not intended to replace advice given to you by your health care provider. Make sure you discuss any questions you have with your health care provider. Document Released: 05/31/2015 Document Revised: 04/28/2018 Document Reviewed: 04/28/2018 Elsevier Patient Education  2020 [employer].  Preventing Diabetes Mellitus Complications You can take action to prevent or slow down problems that are caused by diabetes (diabetes mellitus). Following your diabetes plan and taking care of yourself can reduce your risk of serious or life-threatening complications. What actions can  I take to prevent diabetes complications? Manage your diabetes   Follow instructions from your health care providers about managing your diabetes. Your diabetes may be managed by a team of health care providers who can teach you how to care for yourself and can answer questions that you have.  Educate yourself about your condition so you can make healthy choices about eating and physical activity.  Check your blood sugar (glucose) levels as often as directed. Your health care provider will help you decide how often to check your blood glucose level depending on your treatment goals and how well you are meeting them.  Ask your health care provider if you should take low-dose aspirin daily and what dose is recommended for you. Taking low-dose aspirin daily is recommended to help prevent cardiovascular disease. Do not use nicotine or tobacco Do not use any products that contain nicotine or tobacco, such as cigarettes and e-cigarettes. If you need help quitting, ask your health care provider. Nicotine raises your risk for diabetes problems. If you quit using nicotine:  You will lower your risk for heart attack, stroke, nerve disease, and kidney disease.  Your cholesterol and blood pressure  may improve.  Your blood circulation will improve. Keep your blood pressure under control Your personal target blood pressure is determined based on:  Your age.  Your medicines.  How long you have had diabetes.  Any other medical conditions you have. To control your blood pressure:  Follow instructions from your health care provider about meal planning, exercise, and medicines.  Make sure your health care provider checks your blood pressure at every medical visit.  Monitor your blood pressure at home as told by your health care provider.  Keep your cholesterol under control To control your cholesterol:  Follow instructions from your health care provider about meal planning, exercise, and  medicines.  Have your cholesterol checked at least once a year.  You may be prescribed medicine to lower cholesterol (statin). If you are not taking a statin, ask your health care provider if you should be. Controlling your cholesterol may:  Help prevent heart disease and stroke. These are the most common health problems for people with diabetes.  Improve your blood flow. Schedule and keep yearly physical exams and eye exams Your health care provider will tell you how often you need medical visits depending on your diabetes management plan. Keep all follow-up visits as directed. This is important so possible problems can be identified early and complications can be avoided or treated.  Every visit with your health care provider should include measuring your: ? Weight. ? Blood pressure. ? Blood glucose control.  Your A1c (hemoglobin A1c) level should be checked: ? At least 2 times a year, if you are meeting your treatment goals. ? 4 times a year, if you are not meeting treatment goals or if your treatment goals have changed.  Your blood lipids (lipid profile) should be checked yearly. You should also be checked yearly for protein in your urine (urine microalbumin).  If you have type 1 diabetes, get an eye exam 3-5 years after you are diagnosed, and then once a year after your first exam.  If you have type 2 diabetes, get an eye exam as soon as you are diagnosed, and then once a year after your first exam. Keep your vaccines current It is recommended that you receive:  A flu (influenza) vaccine every year.  A pneumonia (pneumococcal) vaccine and a hepatitis B vaccine. If you are age 17 or older, you may get the pneumonia vaccine as a series of two separate shots. Ask your health care provider which other vaccines may be recommended. Take care of your feet Diabetes may cause you to have poor blood circulation to your legs and feet. Because of this, taking care of your feet is very  important. Diabetes can cause:  The skin on the feet to get thinner, break more easily, and heal more slowly.  Nerve damage in your legs and feet, which results in decreased feeling. You may not notice minor injuries that could lead to serious problems. To avoid foot problems:  Check your skin and feet every day for cuts, bruises, redness, blisters, or sores.  Schedule a foot exam with your health care provider once every year. This exam includes: ? Inspecting of the structure and skin of your feet. ? Checking the pulses and sensation in your feet.  Make sure that your health care provider performs a visual foot exam at every medical visit.  Take care of your teeth People with poorly controlled diabetes are more likely to have gum (periodontal) disease. Diabetes can make periodontal diseases harder to control. If  not treated, periodontal diseases can lead to tooth loss. To prevent this:  Brush your teeth twice a day.  Floss at least once a day.  Visit your dentist 2 times a year. Drink responsibly Limit alcohol intake to no more than 1 drink a day for nonpregnant women and 2 drinks a day for men. One drink equals 12 oz of beer, 5 oz of wine, or 1 oz of hard liquor.  It is important to eat food when you drink alcohol to avoid low blood glucose (hypoglycemia). Avoid alcohol if you:  Have a history of alcohol abuse or dependence.  Are pregnant.  Have liver disease, pancreatitis, advanced neuropathy, or severe hypertriglyceridemia. Lessen stress Living with diabetes can be stressful. When you are experiencing stress, your blood glucose may be affected in two ways:  Stress hormones may cause your blood glucose to rise.  You may be distracted from taking good care of yourself. Be aware of your stress level and make changes to help you manage challenging situations. To lower your stress levels:  Consider joining a support group.  Do planned relaxation or meditation.  Do a hobby  that you enjoy.  Maintain healthy relationships.  Exercise regularly.  Work with your health care provider or a mental health professional. Summary  You can take action to prevent or slow down problems that are caused by diabetes (diabetes mellitus). Following your diabetes plan and taking care of yourself can reduce your risk of serious or life-threatening complications.  Follow instructions from your health care providers about managing your diabetes. Your diabetes may be managed by a team of health care providers who can teach you how to care for yourself and can answer questions that you have.  Your health care provider will tell you how often you need medical visits depending on your diabetes management plan. Keep all follow-up visits as directed. This is important so possible problems can be identified early and complications can be avoided or treated. This information is not intended to replace advice given to you by your health care provider. Make sure you discuss any questions you have with your health care provider. Document Released: 01/20/2011 Document Revised: 08/02/2017 Document Reviewed: 02/01/2016 Elsevier Patient Education  2020 [employer].

## 2019-02-16 NOTE — Progress Notes (Signed)
Subjective:   Angela Pugh is a 73 y.o. female who presents for Medicare Annual (Subsequent) preventive examination.  Pt states she has been doing well.  Pt requesting refill on triamcinolone cream for dermatitis/dry skin of hands.  Pt notes thinning of hair.  FSBS has been ok.  Pt did not eat this am, but is feeling like she may be hypoglycemic.  Pt begins drinking an Ensure.  BP has been controlled on norvasc 5 mg.  Also restarted Kdur 20 mEq.  States had several leftover bottles and didn't want it to go to waste.  Pt notes elevation in BP today 2/2 increased stress.  Pt notes having a less than pleasant experience with front dest staff on check in.    Review of Systems:  General: Denies fever, chills, night sweats, changes in weight, changes in appetite +lightheaded HEENT: Denies headaches, ear pain, changes in vision, rhinorrhea, sore throat  +hair thinning. CV: Denies CP, palpitations, SOB, orthopnea Pulm: Denies SOB, cough, wheezing GI: Denies abdominal pain, nausea, vomiting, diarrhea, constipation GU: Denies dysuria, hematuria, frequency, vaginal discharge Msk: Denies muscle cramps, joint pains Neuro: Denies weakness, numbness, tingling Skin: Denies rashes, bruising Psych: Denies depression, anxiety, hallucinations    Objective:     Vitals: BP (!) 156/80 (BP Location: Left Arm, Patient Position: Sitting, Cuff Size: Large)   Pulse 68   Temp (!) 96.4 F (35.8 C) (Temporal)   Wt 181 lb (82.1 kg)   SpO2 97%   BMI 26.73 kg/m   Body mass index is 26.73 kg/m.  Gen. Pleasant, well developed, well-nourished, in NAD HEENT - [place]/AT, PERRL, EOMI, no scleral icterus, no nasal drainage, pharynx without erythema or exudate. TMs normal b/l Lungs: no use of accessory muscles, CTAB, no wheezes, rales or rhonchi Cardiovascular: RRR, No r/g/m, no peripheral edema Abdomen: BS present, soft, nontender,nondistended, Musculoskeletal: No deformities, moves all four extremities, no cyanosis  or clubbing, normal tone Neuro:  A&Ox3, CN II-XII intact, normal gait Skin:  Warm, dry, intact, no lesions  Advanced Directives 10/17/2018 04/13/2018 04/12/2018 03/17/2018 05/05/2016 05/04/2016 04/14/2016  Does Patient Have a Medical Advance Directive? Yes Yes Yes Yes Yes Yes No  Type of Advance Directive Living will [place];Living will [place];Living will [place];Living will [place];Living will [place];Living will -  Does patient want to make changes to medical advance directive? - No - Patient declined - No - Patient declined No - Patient declined - -  Copy of [place] in Chart? - No - copy requested No - copy requested Yes - Yes -  Would patient like information on creating a medical advance directive? - - - - - No - Patient declined Yes (MAU/Ambulatory/Procedural Areas - Information given)  Pre-existing out of facility DNR order (yellow form or pink MOST form) - - - - - - -    Tobacco Social History   Tobacco Use  Smoking Status Former Smoker  . Packs/day: 0.50  . Years: 20.00  . Pack years: 10.00  . Types: Cigarettes  . Quit date: 06/30/1982  . Years since quitting: 36.6  Smokeless Tobacco Never Used     Counseling given: Not Answered  Past Medical History:  Diagnosis Date  . Arthritis    back, knees, shoulder, hands , injections in pelvis, for bone spurs (05/04/2016)  . Chronic back pain   . Diverticulosis   . History of kidney stones   . HLD (  hyperlipidemia)   . Hypertension   . Migraine    hx (05/04/2016)  . Peripheral neuropathy   . Radiculopathy   . Sinus complaint   . Sinus headache    occasional (05/04/2016)  . Type II diabetes mellitus (HCC)    diet controlled, no meds now, was on insulin & po treatment at one time    . Vertigo    Past Surgical History:  Procedure Laterality Date  . ANTERIOR CERVICAL DECOMP/DISCECTOMY FUSION N/A  04/17/2016   Procedure: ANTERIOR CERVICAL DECOMPRESSION/DISCECTOMY FUSION CERVICAL THREE - CERVICAL FOUR, POSTERIOR CERVICAL DISCECTOMY CERVICAL SEVEN -THORASIC ONE LEFT;  Surgeon: Ashok Pall, MD;  Location: [facility];  Service: Neurosurgery;  Laterality: N/A;  Anterior/Posterior  . ANTERIOR FUSION CERVICAL SPINE  2000   Dr. Carloyn Manner; "for bone spurs; put hardware in too"  . BACK SURGERY    . LEFT HEART CATH AND CORONARY ANGIOGRAPHY N/A 03/17/2018   Procedure: LEFT HEART CATH AND CORONARY ANGIOGRAPHY;  Surgeon: Lorretta Harp, MD;  Location: [facility];  Service: Cardiovascular;  Laterality: N/A;  . LUMBAR LAMINECTOMY Left 06/30/2013   Procedure: Left L3-4 Extraforaminal approach to excise far lateral herniated nucleus pulposus;  Surgeon: Jessy Oto, MD;  Location: [facility];  Service: Orthopedics;  Laterality: Left;  . TUBAL LIGATION    . VAGINAL HYSTERECTOMY     Family History  Problem Relation Age of Onset  . Uterine cancer Mother   . Diabetes type II Sister   . Diabetes Mellitus II Brother    Social History   Socioeconomic History  . Marital status: Widowed    Spouse name: Not on file  . Number of children: Not on file  . Years of education: Not on file  . Highest education level: Not on file  Occupational History  . Not on file  Social Needs  . Financial resource strain: Not on file  . Food insecurity    Worry: Not on file    Inability: Not on file  . Transportation needs    Medical: Not on file    Non-medical: Not on file  Tobacco Use  . Smoking status: Former Smoker    Packs/day: 0.50    Years: 20.00    Pack years: 10.00    Types: Cigarettes    Quit date: 06/30/1982    Years since quitting: 36.6  . Smokeless tobacco: Never Used  Substance and Sexual Activity  . Alcohol use: Yes    Comment: 05/04/2016 "might have a drink a couple days/year; might not"  . Drug use: No  . Sexual activity: Not Currently  Lifestyle  . Physical activity    Days per week: Not on  file    Minutes per session: Not on file  . Stress: Not on file  Relationships  . Social Herbalist on phone: Not on file    Gets together: Not on file    Attends religious service: Not on file    Active member of club or organization: Not on file    Attends meetings of clubs or organizations: Not on file    Relationship status: Not on file  Other Topics Concern  . Not on file  Social History Narrative  . Not on file    Outpatient Encounter Medications as of 02/16/2019  Medication Sig  . aspirin 81 MG EC tablet Take 81 mg by mouth daily. Swallow whole.  . fluticasone (FLONASE) 50 MCG/ACT nasal spray Place 1 spray into both nostrils  daily as needed for allergies or rhinitis.  . Multiple Vitamins-Minerals (CENTRUM ADULTS PO) Take 1 tablet by mouth daily.   Marland Kitchen olmesartan (BENICAR) 5 MG tablet TAKE 2 TABLETS BY MOUTH DAILY  . Omega-3 1000 MG CAPS Take 1,000 mg by mouth daily.   . ondansetron (ZOFRAN ODT) 4 MG disintegrating tablet Take 1 tablet (4 mg total) by mouth every 8 (eight) hours as needed for nausea or vomiting.  . triamcinolone cream (KENALOG) 0.5 % Apply 3 application topically 2 (two) times daily as needed (dry skin).  Marland Kitchen amLODipine (NORVASC) 5 MG tablet Take 1 tablet (5 mg total) by mouth daily.   No facility-administered encounter medications on file as of 02/16/2019.     Activities of Daily Living No flowsheet data found.  Patient Care Team: [name], MD as PCP - General (Family Medicine) Bensimhon, Shaune Pascal, MD as PCP - Advanced Heart Failure (Cardiology)    Assessment:   This is a routine wellness examination for Angela Pugh.  Exercise Activities and Dietary recommendations    Goals   None     Fall Risk Fall Risk  10/18/2017  Falls in the past year? No   Is the patient's home free of loose throw rugs in walkways, pet beds, electrical cords, etc?   yes      Grab bars in the bathroom? no      Handrails on the stairs?   yes      Adequate  lighting?   yes   Depression Screen PHQ 2/9 Scores 02/16/2019 10/18/2017  PHQ - 2 Score 0 0     Cognitive Function No memory deficits.  A&Ox3.  There is no immunization history on file for this patient.  Qualifies for Shingles Vaccine? yes  Screening Tests Health Maintenance  Topic Date Due  . Hepatitis C Screening  [birth year]  . FOOT EXAM  08/27/1956  . TETANUS/TDAP  08/27/1965  . COLONOSCOPY  08/27/1996  . DEXA SCAN  08/28/2011  . PNA vac Low Risk Adult (1 of 2 - PCV13) 08/28/2011  . MAMMOGRAM  09/22/2014  . HEMOGLOBIN A1C  10/12/2016  . INFLUENZA VACCINE  12/17/2018  . OPHTHALMOLOGY EXAM  01/04/2020    Cancer Screenings: Lung: Low Dose CT Chest recommended if Age 57-80 years, 30 pack-year currently smoking OR have quit w/in 15years. Patient does not qualify. Breast:  Up to date on Mammogram? Yes   Up to date of Bone Density/Dexa? No Colorectal: 2017  Additional Screenings: Hepatitis C Screening:      Plan:      Essential hypertension -Elevated -Improved on recheck -Discussed with him modifications -Continue Norvasc 5 mg and olmesartan 5 mg. -We will recheck potassium as pt restarted K. Dur 20 mEq daily.  - Plan: Basic metabolic panel  Mixed hyperlipidemia  - Plan: Lipid panel  Prediabetes  - Plan: Hemoglobin A1c  Hair thinning  - Plan: CBC with Differential/Platelet, TSH, T4, Free  Postmenopausal  - Plan: DG Bone Density  I have personally reviewed and noted the following in the patient's chart:   . Medical and social history . Use of alcohol, tobacco or illicit drugs  . Current medications and supplements . Functional ability and status . Nutritional status . Physical activity . Advanced directives . List of other physicians . Hospitalizations, surgeries, and ER visits in previous 12 months . Vitals . Screenings to include cognitive, depression, and falls . Referrals and appointments  In addition, I have reviewed and discussed with  patient certain preventive protocols,  quality metrics, and best practice recommendations. A written personalized care plan for preventive services as well as general preventive health recommendations were provided to patient.     [name], MD  02/16/2019

## 2019-02-17 ENCOUNTER — Ambulatory Visit: Payer: Medicare Other | Admitting: Podiatry

## 2019-02-20 ENCOUNTER — Encounter: Payer: Self-pay | Admitting: Family Medicine

## 2019-03-28 ENCOUNTER — Other Ambulatory Visit: Payer: Self-pay

## 2019-03-28 ENCOUNTER — Ambulatory Visit
Admission: RE | Admit: 2019-03-28 | Discharge: 2019-03-28 | Disposition: A | Discharge status: Home / Self Care | Payer: Medicare Other | Source: Ambulatory Visit | Attending: Family Medicine | Admitting: Family Medicine

## 2019-03-28 DIAGNOSIS — Z1231 Encounter for screening mammogram for malignant neoplasm of breast: Secondary | ICD-10-CM | POA: Diagnosis not present

## 2019-03-29 ENCOUNTER — Other Ambulatory Visit: Payer: Self-pay | Admitting: Cardiovascular Disease

## 2019-03-30 ENCOUNTER — Ambulatory Visit (INDEPENDENT_AMBULATORY_CARE_PROVIDER_SITE_OTHER): Payer: Medicare Other

## 2019-03-30 ENCOUNTER — Other Ambulatory Visit: Payer: Self-pay

## 2019-03-30 ENCOUNTER — Ambulatory Visit (INDEPENDENT_AMBULATORY_CARE_PROVIDER_SITE_OTHER): Payer: Medicare Other | Admitting: Family Medicine

## 2019-03-30 ENCOUNTER — Encounter: Payer: Self-pay | Admitting: Family Medicine

## 2019-03-30 VITALS — BP 118/80 | HR 67 | Temp 96.3°F | Wt 181.0 lb

## 2019-03-30 DIAGNOSIS — M1711 Unilateral primary osteoarthritis, right knee: Secondary | ICD-10-CM | POA: Diagnosis not present

## 2019-03-30 DIAGNOSIS — M25552 Pain in left hip: Secondary | ICD-10-CM

## 2019-03-30 DIAGNOSIS — M25551 Pain in right hip: Secondary | ICD-10-CM

## 2019-03-30 DIAGNOSIS — Z1211 Encounter for screening for malignant neoplasm of colon: Secondary | ICD-10-CM

## 2019-03-30 DIAGNOSIS — M791 Myalgia, unspecified site: Secondary | ICD-10-CM

## 2019-03-30 DIAGNOSIS — M79605 Pain in left leg: Secondary | ICD-10-CM | POA: Diagnosis not present

## 2019-03-30 DIAGNOSIS — M79604 Pain in right leg: Secondary | ICD-10-CM

## 2019-03-30 DIAGNOSIS — S79911A Unspecified injury of right hip, initial encounter: Secondary | ICD-10-CM | POA: Diagnosis not present

## 2019-03-30 DIAGNOSIS — S79912A Unspecified injury of left hip, initial encounter: Secondary | ICD-10-CM | POA: Diagnosis not present

## 2019-03-30 NOTE — Patient Instructions (Addendum)
You can use Voltaren gel for your hip pain.  It can be found on the shelf at the drug store. Hip Pain  The hip is the joint between the upper legs and the lower pelvis. The bones, cartilage, tendons, and muscles of your hip joint support your body and allow you to move around. Hip pain can range from a minor ache to severe pain in one or both of your hips. The pain may be felt on the inside of the hip joint near the groin, or the outside near the buttocks and upper thigh. You may also have swelling or stiffness. Follow these instructions at home: Managing pain, stiffness, and swelling  If directed, apply ice to the injured area. ? Put ice in a plastic bag. ? Place a towel between your skin and the bag. ? Leave the ice on for 20 minutes, 2-3 times a day  Sleep with a pillow between your legs on your most comfortable side.  Avoid any activities that cause pain. General instructions  Take over-the-counter and prescription medicines only as told by your health care provider.  Do any exercises as told by your health care provider.  Record the following: ? How often you have hip pain. ? The location of your pain. ? What the pain feels like. ? What makes the pain worse.  Keep all follow-up visits as told by your health care provider. This is important. Contact a health care provider if:  You cannot put weight on your leg.  Your pain or swelling continues or gets worse after one week.  It gets harder to walk.  You have a fever. Get help right away if:  You fall.  You have a sudden increase in pain and swelling in your hip.  Your hip is red or swollen or very tender to touch. Summary  Hip pain can range from a minor ache to severe pain in one or both of your hips.  The pain may be felt on the inside of the hip joint near the groin, or the outside near the buttocks and upper thigh.  Avoid any activities that cause pain.  Record how often you have hip pain, the location of  the pain, what makes it worse and what it feels like. This information is not intended to replace advice given to you by your health care provider. Make sure you discuss any questions you have with your health care provider. Document Released: 10/22/2009 Document Revised: 04/16/2017 Document Reviewed: 04/06/2016 Elsevier Patient Education  2020 [place].  Hip Bursitis  Hip bursitis is inflammation of a fluid-filled sac (bursa) in the hip joint. The bursa prevents the bones in the hip joint from rubbing against each other. Hip bursitis can cause mild to moderate pain, and symptoms often come and go over time. What are the causes? This condition may be caused by:  Injury to the hip.  Overuse of the muscles that surround the hip joint.  Previous injury or surgery of the hip.  Arthritis or gout.  Diabetes.  Thyroid disease.  Infection. In some cases, the cause may not be known. What are the signs or symptoms? Symptoms of this condition include:  Mild or moderate pain in the hip area. Pain may get worse with movement.  Tenderness and swelling of the hip, especially on the outer side of the hip.  In rare cases, the bursa may become infected. This may cause a fever, as well as warmth and redness in the area. Symptoms may  come and go. How is this diagnosed? This condition may be diagnosed based on:  A physical exam.  Your medical history.  X-rays.  Removal of fluid from your inflamed bursa for testing (biopsy). You may be sent to a health care provider who specializes in bone diseases (orthopedist) or a provider who specializes in joint inflammation (rheumatologist). How is this treated? This condition is treated by resting, icing, applying pressure (compression), and raising (elevating) the injured area. This is called RICE treatment. In some cases, this may be enough to make your symptoms go away. Treatment may also include:  Using crutches.  Draining fluid out of the  bursa to help relieve swelling.  Injecting medicine that helps to reduce inflammation (cortisone).  Additional medicines if the bursa is infected. Follow these instructions at home: Managing pain, stiffness, and swelling   If directed, put ice on the painful area. ? Put ice in a plastic bag. ? Place a towel between your skin and the bag. ? Leave the ice on for 20 minutes, 2-3 times a day. ? Raise (elevate) your hip above the level of your heart as much as you can without pain. To do this, try putting a pillow under your hips while you lie down. Activity  Return to your normal activities as told by your health care provider. Ask your health care provider what activities are safe for you.  Rest and protect your hip as much as possible until your pain and swelling get better. General instructions  Take over-the-counter and prescription medicines only as told by your health care provider.  Wear compression wraps only as told by your health care provider.  Do not use your hip to support your body weight until your health care provider says that you can. Use crutches as told by your health care provider.  Gently massage and stretch your injured area as often as is comfortable.  Keep all follow-up visits as told by your health care provider. This is important. How is this prevented?  Exercise regularly, as told by your health care provider.  Warm up and stretch before being active.  Cool down and stretch after being active.  If an activity irritates your hip or causes pain, avoid the activity as much as possible.  Avoid sitting down for long periods at a time. Contact a health care provider if you:  Have a fever.  Develop new symptoms.  Have difficulty walking or doing everyday activities.  Have pain that gets worse or does not get better with medicine.  Develop red skin or a feeling of warmth in your hip area. Get help right away if you:  Cannot move your hip.  Have  severe pain. Summary  Hip bursitis is inflammation of a fluid-filled sac (bursa) in the hip joint.  Hip bursitis can cause mild to moderate pain, and symptoms often come and go over time.  This condition is treated with rest, ice, compression, elevation, and medicines. This information is not intended to replace advice given to you by your health care provider. Make sure you discuss any questions you have with your health care provider. Document Released: 10/24/2001 Document Revised: 01/10/2018 Document Reviewed: 01/10/2018 Elsevier Patient Education  2020 [employer].

## 2019-03-30 NOTE — Progress Notes (Signed)
Subjective:    Patient ID: Angela Pugh, female    DOB: 1945-12-31, 73 y.o.   MRN: [id number]  Chief Complaint  Patient presents with  . Leg Pain    HPI Patient was seen today for acute concern.  Pt notes b/l pain in thighs and cramps after standing from a squatted position.  Pt denies hearing any pops, clicks, or tears.  Pt states R knee later became swollen and painful.  Tried Ibuprofen with little relief.  Past Medical History:  Diagnosis Date  . Arthritis    back, knees, shoulder, hands , injections in pelvis, for bone spurs (05/04/2016)  . Chronic back pain   . Diverticulosis   . History of kidney stones   . HLD (hyperlipidemia)   . Hypertension   . Migraine    hx (05/04/2016)  . Peripheral neuropathy   . Radiculopathy   . Sinus complaint   . Sinus headache    occasional (05/04/2016)  . Type II diabetes mellitus (HCC)    diet controlled, no meds now, was on insulin & po treatment at one time    . Vertigo     Allergies  Allergen Reactions  . Acetaminophen Hives and Nausea Only  . Atorvastatin Nausea Only    weakness  . Azithromycin Hives and Nausea And Vomiting    ROS General: Denies fever, chills, night sweats, changes in weight, changes in appetite HEENT: Denies headaches, ear pain, changes in vision, rhinorrhea, sore throat CV: Denies CP, palpitations, SOB, orthopnea Pulm: Denies SOB, cough, wheezing GI: Denies abdominal pain, nausea, vomiting, diarrhea, constipation GU: Denies dysuria, hematuria, frequency, vaginal discharge Msk: Denies muscle cramps, joint pains  +b/l leg pain, R knee swelling and pain Neuro: Denies weakness, numbness, tingling Skin: Denies rashes, bruising Psych: Denies depression, anxiety, hallucinations      Objective:    Blood pressure 118/80, pulse 67, temperature (!) 96.3 F (35.7 C), temperature source Temporal, weight 181 lb (82.1 kg), SpO2 97 %.   Gen. Pleasant, well-nourished, in no distress, normal affect   HEENT:  /AT, face symmetric, conjunctiva clear, no scleral icterus, PERRLA, nares patent without drainage Lungs: no accessory muscle use Cardiovascular: RRR, no m/r/g, no peripheral edema Musculoskeletal: TTP of b/l hips, IT bands, and medial thighs.  No TTP of joint lines of b/l knees.  Pain with Abduction on b/l legs.  +log roll, straight leg raise, and FADIR, and FABER of b/l LEs R>L.  TTP of lumbar spine.  No deformities, no cyanosis or clubbing, normal tone Neuro:  A&Ox3, CN II-XII intact, normal gait Skin:  Warm, no lesions/ rash   Wt Readings from Last 3 Encounters:  03/30/19 181 lb (82.1 kg)  02/16/19 181 lb (82.1 kg)  11/09/18 180 lb (81.6 kg)    Lab Results  Component Value Date   WBC 6.3 02/16/2019   HGB 15.5 (H) 02/16/2019   HCT 46.0 02/16/2019   PLT 319.0 02/16/2019   GLUCOSE 132 (H) 02/16/2019   CHOL 273 (H) 02/16/2019   TRIG 121.0 02/16/2019   HDL 59.00 02/16/2019   LDLCALC 190 (H) 02/16/2019   ALT 13 11/03/2018   AST 13 11/03/2018   NA 140 02/16/2019   K 4.1 02/16/2019   CL 102 02/16/2019   CREATININE 0.95 02/16/2019   BUN 18 02/16/2019   CO2 27 02/16/2019   TSH 1.03 02/16/2019   INR 1.06 01/16/2015   HGBA1C 6.7 (H) 02/16/2019    Assessment/Plan:  Pain in both lower extremities  -supportive care -  Plan: DG Knee Complete 4 Views Right  Acute pain of both hips  -discussed possible causes arthritis, bursitis, muscle strain -supportive care: ice, heat, stretching, NSAIDs -consider use of OTC Voltaren gel -will make further recommendations after xrays. - Plan: DG HIPS BILAT WITH PELVIS 3-4 VIEWS  Myalgia -medications reviewed.  Not on a statin or diuretic. -stretching, heat, massage  Colon cancer screening  - Plan: Cologuard  F/u prn  Grier Mitts, MD

## 2019-04-04 ENCOUNTER — Ambulatory Visit: Payer: Self-pay

## 2019-04-04 NOTE — Telephone Encounter (Signed)
Please advise Dr. Volanda Napoleon

## 2019-04-04 NOTE — Telephone Encounter (Signed)
Per pt on 02/16/2019 OFV she has been taking Kdur 75mEq daily as she had left over medicine that she did not want to go to waste.  K+ was in the normal range when checked 10/1.  Please advise pt to stop taking the Kdur to see if she notices a difference in her muscle cramping.

## 2019-04-04 NOTE — Telephone Encounter (Signed)
Spoke with patient. Informed her per Dr. Volanda Napoleon Per pt on 02/16/2019 OFV she has been taking Kdur 64mEq daily as she had left over medicine that she did not want to go to waste.  K+ was in the normal range when checked 10/1.  Please advise pt to stop taking the Kdur to see if she notices a difference in her muscle cramping. Patient verbalized understanding.

## 2019-04-04 NOTE — Telephone Encounter (Signed)
Provided Pt.  The  Result of knee xray.  Patient asks if she may be taking to much potassium or to little potassium. Pt.  states that she has be having leg cramps.  States heart Dr.. prescribed it for her.

## 2019-04-07 ENCOUNTER — Ambulatory Visit: Payer: Self-pay

## 2019-04-07 NOTE — Telephone Encounter (Signed)
Called patient back regarding her abd pain that has been going on for about a week. She stated that it comes and goes and when she eats something it is worst with abd cramping. Sharp abd pains. The pain does not radiate. She denies nausea and vomiting or fever. She denies bloating or abd distention. Her stool looks is clay colored.  She is advised to be seen at the ED. She wants to wait until after tonight and if the pain gets worst over night, she will go to the ED. Routing to LB at [facility] for review. Reason for Disposition . [1] MODERATE pain (e.g., interferes with normal activities) AND [2] pain comes and goes (cramps) AND [3] present > 24 hours  (Exception: pain with Vomiting or Diarrhea - see that Guideline)  Answer Assessment - Initial Assessment Questions 1. LOCATION: "Where does it hurt?"      Whole abd 2. RADIATION: "Does the pain shoot anywhere else?" (e.g., chest, back)     no 3. ONSET: "When did the pain begin?" (e.g., minutes, hours or days ago)      A week ago 4. SUDDEN: "Gradual or sudden onset?"     gradual 5. PATTERN "Does the pain come and go, or is it constant?"    - If constant: "Is it getting better, staying the same, or worsening?"      (Note: Constant means the pain never goes away completely; most serious pain is constant and it progresses)     - If intermittent: "How long does it last?" "Do you have pain now?"     (Note: Intermittent means the pain goes away completely between bouts)     Comes and goes and now comes and goes 6. SEVERITY: "How bad is the pain?"  (e.g., Scale 1-10; mild, moderate, or severe)   - MILD (1-3): doesn't interfere with normal activities, abdomen soft and not tender to touch    - MODERATE (4-7): interferes with normal activities or awakens from sleep, tender to touch    - SEVERE (8-10): excruciating pain, doubled over, unable to do any normal activities      Pain # 10 when it happens 7. RECURRENT SYMPTOM: "Have you ever had this type of  abdominal pain before?" If so, ask: "When was the last time?" and "What happened that time?"      no 8. CAUSE: "What do you think is causing the abdominal pain?"     Not sure 9. RELIEVING/AGGRAVATING FACTORS: "What makes it better or worse?" (e.g., movement, antacids, bowel movement)     Eating makes it worst, taking imodium has made it a little easier 10. OTHER SYMPTOMS: "Has there been any vomiting, diarrhea, constipation, or urine problems?"       diarrhea 11. PREGNANCY: "Is there any chance you are pregnant?" "When was your last menstrual period?"       n/a  Protocols used: ABDOMINAL PAIN - [facility]

## 2019-04-07 NOTE — Telephone Encounter (Signed)
Pt states she is experiencing abdominal pain and diarrhea x 1wk. Please advise.   Attempted to call patient- left message to call back

## 2019-04-08 NOTE — Telephone Encounter (Signed)
Patient was advised to go to ED but has not yet went.

## 2019-04-10 NOTE — Telephone Encounter (Signed)
Called pt left a detailed message on Voicemail regarding the Triage message this morning,advised pt to call the office.

## 2019-04-10 NOTE — Telephone Encounter (Signed)
Will you call and check on Angela Pugh.

## 2019-04-11 ENCOUNTER — Telehealth: Payer: Self-pay | Admitting: Family Medicine

## 2019-04-11 NOTE — Telephone Encounter (Signed)
FYI Spoke with pt states that she feels better after taking Imodium, pt advised to call the office with any concerns, verbalized understanding

## 2019-04-11 NOTE — Telephone Encounter (Signed)
Pt called in returning Nancy's call, pt says that she took imodium and Is feeling better.

## 2019-04-12 NOTE — Telephone Encounter (Signed)
Spoke with pt states that she feels better after taking Imodium, pt advised to call the office with any concerns, verbalized understanding

## 2019-04-23 ENCOUNTER — Other Ambulatory Visit: Payer: Self-pay | Admitting: Cardiovascular Disease

## 2019-05-24 ENCOUNTER — Other Ambulatory Visit: Payer: Self-pay

## 2019-05-24 ENCOUNTER — Other Ambulatory Visit (INDEPENDENT_AMBULATORY_CARE_PROVIDER_SITE_OTHER): Payer: Medicare Other

## 2019-05-24 DIAGNOSIS — E7800 Pure hypercholesterolemia, unspecified: Secondary | ICD-10-CM

## 2019-05-24 LAB — LIPID PANEL
Cholesterol: 243 mg/dL — ABNORMAL HIGH (ref 0–200)
HDL: 54.3 mg/dL (ref >39.00)
LDL Cholesterol: 167 mg/dL — ABNORMAL HIGH (ref 0–99)
NonHDL: 188.37
Total CHOL/HDL Ratio: 4
Triglycerides: 105 mg/dL (ref 0.0–149.0)
VLDL: 21 mg/dL (ref 0.0–40.0)

## 2019-05-26 ENCOUNTER — Ambulatory Visit: Payer: Medicare Other | Admitting: Podiatry

## 2019-05-26 ENCOUNTER — Other Ambulatory Visit: Payer: Self-pay

## 2019-05-26 ENCOUNTER — Telehealth: Payer: Self-pay | Admitting: Podiatry

## 2019-05-26 ENCOUNTER — Encounter: Payer: Self-pay | Admitting: Podiatry

## 2019-05-26 DIAGNOSIS — M79675 Pain in left toe(s): Secondary | ICD-10-CM | POA: Diagnosis not present

## 2019-05-26 DIAGNOSIS — E1151 Type 2 diabetes mellitus with diabetic peripheral angiopathy without gangrene: Secondary | ICD-10-CM

## 2019-05-26 DIAGNOSIS — M2042 Other hammer toe(s) (acquired), left foot: Secondary | ICD-10-CM

## 2019-05-26 DIAGNOSIS — B351 Tinea unguium: Secondary | ICD-10-CM | POA: Diagnosis not present

## 2019-05-26 DIAGNOSIS — M79674 Pain in right toe(s): Secondary | ICD-10-CM

## 2019-05-26 DIAGNOSIS — Q828 Other specified congenital malformations of skin: Secondary | ICD-10-CM | POA: Diagnosis not present

## 2019-05-26 DIAGNOSIS — M2041 Other hammer toe(s) (acquired), right foot: Secondary | ICD-10-CM | POA: Diagnosis not present

## 2019-05-26 DIAGNOSIS — E119 Type 2 diabetes mellitus without complications: Secondary | ICD-10-CM

## 2019-05-26 NOTE — Telephone Encounter (Signed)
Patient received a bill for her last appt but believes it might not have been filed through her secondary insurance as well. Patient wanted to double check. Please give patient a call.

## 2019-05-26 NOTE — Progress Notes (Signed)
Subjective: Angela Pugh is a 74 y.o. y.o. female with h/o diabetes who presents today for preventative diabetic foot care. Patient has painful, elongated mycotic toenails. Also has painful plantar porokeratoses b/l which pose a risk and interfere with daily activities. Pain is aggravated when wearing enclosed shoe gear and relieved with periodic professional debridement.  [name], MD is patient's PCP. Last visit was 03/30/2019.  Medications reviewed in chart.  Allergies  Allergen Reactions  . Acetaminophen Hives and Nausea Only  . Atorvastatin Nausea Only    weakness  . Azithromycin Hives and Nausea And Vomiting    Objective: There were no vitals filed for this visit.  Vascular Examination: Capillary refill time to digits immediate b/l.  Dorsalis pedis pulses palpable b/l.  Posterior tibial pulses nonpalpable.  Digital hair sparse b/l.  Skin temperature gradient WNL b/l.  Dermatological Examination: Skin with normal turgor, texture and tone b/l.  Toenails 1-5 b/l discolored, thick, dystrophic with subungual debris and pain with palpation to nailbeds due to thickness of nails.   Porokeratotic lesions submet heads 2 b/l and plantar right heel with tenderness to palpation. No erythema, no edema, no drainage, no flocculence.   Musculoskeletal: Muscle strength 5/5 to all LE muscle groups b/l.  Hammertoes 5th digits b/l.  Neurological: Sensation intact 5/5 b/l with 10 gram monofilament.  Vibratory sensation intact b/l.  Last A1c:  Hemoglobin A1C Latest Ref Rng & Units 02/16/2019  HGBA1C 4.6 - 6.5 % 6.7(H)  Some recent data might be hidden   Assessment: 1. Painful onychomycosis toenails 1-5 b/l 2.  Porokeratoses submet head 2 b/l and plantar right heel 3.   NIDDM with PAD 4.  Encounter for diabetic foot exam  Plan: 1. Diabetic foot exam performed on today's visit. 2. Continue diabetic foot care principles. Literature dispensed on today. 3. Toenails  1-5 b/l were debrided in length and girth without iatrogenic bleeding. 4. Hyperkeratotic lesion(s) pared with sterile scalpel blade without incident. 5. Porokeratoses submet head 2 b/l and plantar right heel pared and enucleated with sterile scalpel blade without incident.  6. Patient to continue soft, supportive shoe gear daily. 7. Patient to report any pedal injuries to medical professional immediately. 8. Follow up 3 months.  9. Patient/POA to call should there be a concern in the interim.

## 2019-05-26 NOTE — Patient Instructions (Signed)
Diabetes Mellitus and Foot Care Foot care is an important part of your health, especially when you have diabetes. Diabetes may cause you to have problems because of poor blood flow (circulation) to your feet and legs, which can cause your skin to:  Become thinner and drier.  Break more easily.  Heal more slowly.  Peel and crack. You may also have nerve damage (neuropathy) in your legs and feet, causing decreased feeling in them. This means that you may not notice minor injuries to your feet that could lead to more serious problems. Noticing and addressing any potential problems early is the best way to prevent future foot problems. How to care for your feet Foot hygiene  Wash your feet daily with warm water and mild soap. Do not use hot water. Then, pat your feet and the areas between your toes until they are completely dry. Do not soak your feet as this can dry your skin.  Trim your toenails straight across. Do not dig under them or around the cuticle. File the edges of your nails with an emery board or nail file.  Apply a moisturizing lotion or petroleum jelly to the skin on your feet and to dry, brittle toenails. Use lotion that does not contain alcohol and is unscented. Do not apply lotion between your toes. Shoes and socks  Wear clean socks or stockings every day. Make sure they are not too tight. Do not wear knee-high stockings since they may decrease blood flow to your legs.  Wear shoes that fit properly and have enough cushioning. Always look in your shoes before you put them on to be sure there are no objects inside.  To break in new shoes, wear them for just a few hours a day. This prevents injuries on your feet. Wounds, scrapes, corns, and calluses  Check your feet daily for blisters, cuts, bruises, sores, and redness. If you cannot see the bottom of your feet, use a mirror or ask someone for help.  Do not cut corns or calluses or try to remove them with medicine.  If you  find a minor scrape, cut, or break in the skin on your feet, keep it and the skin around it clean and dry. You may clean these areas with mild soap and water. Do not clean the area with peroxide, alcohol, or iodine.  If you have a wound, scrape, corn, or callus on your foot, look at it several times a day to make sure it is healing and not infected. Check for: ? Redness, swelling, or pain. ? Fluid or blood. ? Warmth. ? Pus or a bad smell. General instructions  Do not cross your legs. This may decrease blood flow to your feet.  Do not use heating pads or hot water bottles on your feet. They may burn your skin. If you have lost feeling in your feet or legs, you may not know this is happening until it is too late.  Protect your feet from hot and cold by wearing shoes, such as at the beach or on hot pavement.  Schedule a complete foot exam at least once a year (annually) or more often if you have foot problems. If you have foot problems, report any cuts, sores, or bruises to your health care provider immediately. Contact a health care provider if:  You have a medical condition that increases your risk of infection and you have any cuts, sores, or bruises on your feet.  You have an injury that is not   healing.  You have redness on your legs or feet.  You feel burning or tingling in your legs or feet.  You have pain or cramps in your legs and feet.  Your legs or feet are numb.  Your feet always feel cold.  You have pain around a toenail. Get help right away if:  You have a wound, scrape, corn, or callus on your foot and: ? You have pain, swelling, or redness that gets worse. ? You have fluid or blood coming from the wound, scrape, corn, or callus. ? Your wound, scrape, corn, or callus feels warm to the touch. ? You have pus or a bad smell coming from the wound, scrape, corn, or callus. ? You have a fever. ? You have a red line going up your leg. Summary  Check your feet every day  for cuts, sores, red spots, swelling, and blisters.  Moisturize feet and legs daily.  Wear shoes that fit properly and have enough cushioning.  If you have foot problems, report any cuts, sores, or bruises to your health care provider immediately.  Schedule a complete foot exam at least once a year (annually) or more often if you have foot problems. This information is not intended to replace advice given to you by your health care provider. Make sure you discuss any questions you have with your health care provider. Document Revised: 01/25/2019 Document Reviewed: 06/05/2016 Elsevier Patient Education  2020 [employer]  

## 2019-06-20 ENCOUNTER — Other Ambulatory Visit: Payer: Self-pay

## 2019-06-20 ENCOUNTER — Telehealth: Payer: Self-pay | Admitting: Family Medicine

## 2019-06-20 DIAGNOSIS — M170 Bilateral primary osteoarthritis of knee: Secondary | ICD-10-CM

## 2019-06-20 NOTE — Telephone Encounter (Signed)
Ok for Referral to Ortho for knee pain

## 2019-06-20 NOTE — Telephone Encounter (Signed)
That's fine.  Pt has a h/o osteoarthritis in knees.

## 2019-06-20 NOTE — Telephone Encounter (Signed)
Patient called stating both knees still hurting and can barely put weight on it. Pt requesting a referral to orthopedics.

## 2019-06-20 NOTE — Telephone Encounter (Signed)
Order for Ortho placed, pt notified through [facility]

## 2019-06-28 ENCOUNTER — Ambulatory Visit: Payer: Self-pay

## 2019-06-28 ENCOUNTER — Other Ambulatory Visit: Payer: Self-pay

## 2019-06-28 ENCOUNTER — Encounter: Payer: Self-pay | Admitting: Specialist

## 2019-06-28 ENCOUNTER — Ambulatory Visit: Payer: Medicare Other | Admitting: Specialist

## 2019-06-28 VITALS — BP 118/72 | HR 54 | Ht 68.75 in | Wt 181.0 lb

## 2019-06-28 DIAGNOSIS — M4316 Spondylolisthesis, lumbar region: Secondary | ICD-10-CM

## 2019-06-28 DIAGNOSIS — M160 Bilateral primary osteoarthritis of hip: Secondary | ICD-10-CM

## 2019-06-28 DIAGNOSIS — M4802 Spinal stenosis, cervical region: Secondary | ICD-10-CM

## 2019-06-28 DIAGNOSIS — M25562 Pain in left knee: Secondary | ICD-10-CM

## 2019-06-28 DIAGNOSIS — M48062 Spinal stenosis, lumbar region with neurogenic claudication: Secondary | ICD-10-CM

## 2019-06-28 DIAGNOSIS — M542 Cervicalgia: Secondary | ICD-10-CM | POA: Diagnosis not present

## 2019-06-28 DIAGNOSIS — Z981 Arthrodesis status: Secondary | ICD-10-CM

## 2019-06-28 DIAGNOSIS — M1712 Unilateral primary osteoarthritis, left knee: Secondary | ICD-10-CM

## 2019-06-28 DIAGNOSIS — M1711 Unilateral primary osteoarthritis, right knee: Secondary | ICD-10-CM

## 2019-06-28 DIAGNOSIS — M25561 Pain in right knee: Secondary | ICD-10-CM

## 2019-06-28 MED ORDER — GABAPENTIN 100 MG PO CAPS: 100.0000 mg | ORAL_CAPSULE | Freq: Every day | ORAL | 3 refills | Status: DC

## 2019-06-28 NOTE — Patient Instructions (Signed)
Avoid overhead lifting and overhead use of the arms. Do not lift greater than 5 lbs. Adjust head rest in vehicle to prevent hyperextension if rear ended. Take extra precautions to avoid falling, including use of a cane if you feel weak. Avoid bending, stooping and avoid lifting weights greater than 10 lbs. Avoid prolong standing and walking. Avoid frequent bending and stooping  No lifting greater than 10 lbs. May use ice or moist heat for pain. Weight loss is of benefit. Handicap license is approved. May need to consider a epidural steroid injection. Knee is suffering from osteoarthritis, only real proven treatments are Weight loss, NSIADs like motrin and exercise. Well padded shoes help. Ice the knee 2-3 times a day 15-20 mins at a time.

## 2019-06-28 NOTE — Progress Notes (Signed)
Office Visit Note   Patient: Angela Pugh           Date of Birth: 1946-01-29           MRN: [id number] Visit Date: 06/28/2019              Requested by: [name], MD [place],  [place] [id number] PCP: [name], MD   Assessment & Plan: Visit Diagnoses:  1. Pain in both knees, unspecified chronicity   2. Cervicalgia   3. Spinal stenosis of lumbar region with neurogenic claudication   4. Spondylolisthesis, lumbar region   5. History of fusion of cervical spine   6. Spinal stenosis of cervical region   7. Unilateral primary osteoarthritis, left knee   8. Unilateral primary osteoarthritis, right knee   9. Bilateral primary osteoarthritis of hip     Plan: Avoid overhead lifting and overhead use of the arms. Do not lift greater than 5 lbs. Adjust head rest in vehicle to prevent hyperextension if rear ended. Take extra precautions to avoid falling, including use of a cane if you feel weak. Avoid bending, stooping and avoid lifting weights greater than 10 lbs. Avoid prolong standing and walking. Avoid frequent bending and stooping  No lifting greater than 10 lbs. May use ice or moist heat for pain. Weight loss is of benefit. Handicap license is approved. May need to consider a epidural steroid injection. Knee is suffering from osteoarthritis, only real proven treatments are Weight loss, NSIADs like motrin and exercise. Well padded shoes help. Ice the knee 2-3 times a day 15-20 mins at a time  Follow-Up Instructions: Return in about 6 weeks (around 08/09/2019).   Orders:  Orders Placed This Encounter  Procedures  . XR Knee 1-2 Views Left  . XR Knee 1-2 Views Right  . XR Cervical Spine 2 or 3 views  . XR Lumbar Spine 2-3 Views   No orders of the defined types were placed in this encounter.     Procedures: No procedures performed   Clinical Data: No additional findings.   Subjective: Chief Complaint  Patient presents with    . Right Knee - Pain  . Left Knee - Pain    74 year old female with history of bilateral knee pain left greater than right now it is the right knee that is more painful. Seen 2013-14 and had injections of the knees with cortisoned with improvement that lasted till recently. Pain is worse in the AM with standing and after sitting for polong periods. 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 She report the pain is worsening, Standing for more than 10-15 minutes has to sit down. Had an episode of right leg weakness and right right leg gave away. The legs get tired with prolong standing and walking. In the past she has had several surgery Dr. Glenna Fellows lumbar surgery, Dr. Christella Noa on the cervical spine. Has pain with kneeling, squatting and standing and is tending to hold on to things with hands and also leans on the cart at the grocery. Can not walk a mile and does not have a handicap stick.   Review of Systems  Constitutional: Negative.  Negative for activity change, appetite change, chills, diaphoresis, fatigue, fever and unexpected weight change.  HENT: Negative.  Negative for congestion, dental problem, drooling, ear discharge, ear pain, facial swelling, hearing loss, mouth sores, nosebleeds, postnasal drip, rhinorrhea, sinus pressure,  sinus pain, sneezing, sore throat, tinnitus, trouble swallowing and voice change.   Eyes: Positive for visual disturbance (Diabetic retina exams, no laser treatment, diet and exercise works. ). Negative for photophobia, pain, discharge, redness and itching.  Respiratory: Negative.  Negative for apnea, cough, choking, chest tightness, shortness of breath, wheezing and stridor.   Cardiovascular: Positive for palpitations (occasional in a blue moon) and leg swelling. Negative for chest pain.  Gastrointestinal: Negative.  Negative for abdominal distention, abdominal pain, anal bleeding, blood in stool, constipation, diarrhea,  nausea, rectal pain and vomiting.  Endocrine: Positive for cold intolerance. Negative for heat intolerance, polydipsia, polyphagia and polyuria.  Genitourinary: Negative.  Negative for difficulty urinating, dyspareunia, dysuria, enuresis, flank pain, frequency, hematuria and urgency.  Musculoskeletal: Positive for back pain, gait problem, joint swelling, neck pain and neck stiffness. Negative for arthralgias.  Skin: Negative.  Negative for color change, pallor, rash and wound.  Allergic/Immunologic: Negative for environmental allergies, food allergies and immunocompromised state.  Neurological: Positive for weakness and numbness. Negative for dizziness, tremors, seizures, syncope, facial asymmetry, speech difficulty, light-headedness and headaches.  Hematological: Negative for adenopathy. Does not bruise/bleed easily.  Psychiatric/Behavioral: Negative for agitation, behavioral problems, confusion, decreased concentration, dysphoric mood, hallucinations, self-injury, sleep disturbance and suicidal ideas. The patient is not nervous/anxious and is not hyperactive.      Objective: Vital Signs: BP 118/72 (BP Location: Left Arm, Patient Position: Sitting)   Pulse (!) 54   Ht 5' 8.75" (1.746 m)   Wt 181 lb (82.1 kg)   BMI 26.92 kg/m   Physical Exam Constitutional:      Appearance: She is well-developed.  HENT:     Head: Normocephalic and atraumatic.  Eyes:     Pupils: Pupils are equal, round, and reactive to light.  Pulmonary:     Effort: Pulmonary effort is normal.     Breath sounds: Normal breath sounds.  Abdominal:     General: Bowel sounds are normal.     Palpations: Abdomen is soft.  Musculoskeletal:        General: Normal range of motion.     Cervical back: Normal range of motion and neck supple.  Skin:    General: Skin is warm and dry.  Neurological:     Mental Status: She is alert and oriented to person, place, and time.  Psychiatric:        Behavior: Behavior normal.         Thought Content: Thought content normal.        Judgment: Judgment normal.     Ortho Exam  Specialty Comments:  No specialty comments available.  Imaging: XR Knee 1-2 Views Left  Result Date: 06/28/2019 AP standing bilateral knees with latera left knee shows narrowing of the lateral joint line greater than medial with sharpening of the median eminences and narrowing of the intercondylar notch. Subchondral sclerosis and calcification of the lateral meniscus and lateral knee joint osteophytes that are mild. The patellofemoral joint is irregular and show inferior osteophyte and superior osteophytes.  Findings consistent with valgus tricompartment OA.  XR Cervical Spine 2 or 3 views  Result Date: 06/28/2019 Cervical spine radiographs AP and lateral flexion and extension radiographs show anterior cervical fusion from C3-4 through C6-7. Disc at C7-T1 is open and there is anterolisthesis of C7 on T1, Posterior wiring from C7 to T3 and posterior Instrumentation with pedicle screws and rods from T1 to T3. The fusion anteriorly C3 to C6 appears solid. No gross motion noted at C7-T1 and  the fusion T1-T3 appears solid. The space available for the cord at C7-T1 appears narrowed.   XR Knee 1-2 Views Right  Result Date: 06/28/2019 AP standing bilateral knees with latera right knee shows narrowing of the medial joint line greater than lateral with sharpening of the median eminences and narrowing of the intercondylar notch. Subchondral sclerosis medial knee joint osteophytes that are mild. The patellofemoral joint is irregular and showsuperior osteophytes.  Findings consistent with valgus tricompartment OA.   XR Lumbar Spine 2-3 Views  Result Date: 06/28/2019 AP and lateral flexion and extension radiographs of the lumbar spine show narrowing of the disc height L3-4, L4-5 and L5-S1. Grade 1 anterolisthesis at L4-5 that worsens with flexion form 2 mm to 4 mm. There is a left lumbar scolios from L2 to L5 of 17  degrees. Mild SI sclerosis. Bilateral hip arthritis changes right side greater than the left that are moderately severe.     PMFS History: Patient Active Problem List   Diagnosis Date Noted  . Herniated lumbar intervertebral disc 06/30/2013    Priority: High    Class: Acute  . Nonischemic cardiomyopathy ([facility]) 04/12/2018  . Chest pain 01/25/2018  . Left bundle branch block 01/25/2018  . Carotid artery disease ([facility]) 01/25/2018  . Seasonal allergies 10/18/2017  . Arthritis 10/18/2017  . Pre-diabetes 10/18/2017  . Protein-calorie malnutrition, severe 05/05/2016  . Dysphagia 05/04/2016  . HNP (herniated nucleus pulposus) with myelopathy, cervical 04/17/2016  . Abdominal pain 02/01/2016  . Acute diverticulitis 02/01/2016  . HLD (hyperlipidemia) 02/01/2016  . Diabetes mellitus without complication ([facility]) [id number]  . Hypokalemia 02/01/2016  . Hypertension   . Essential hypertension   . Cervical spondylosis without myelopathy 01/13/2016  . Bilateral carpal tunnel syndrome 01/13/2016   Past Medical History:  Diagnosis Date  . Arthritis    back, knees, shoulder, hands , injections in pelvis, for bone spurs (05/04/2016)  . Chronic back pain   . Diverticulosis   . History of kidney stones   . HLD (hyperlipidemia)   . Hypertension   . Migraine    hx (05/04/2016)  . Peripheral neuropathy   . Radiculopathy   . Sinus complaint   . Sinus headache    occasional (05/04/2016)  . Type II diabetes mellitus ([facility])    diet controlled, no meds now, was on insulin & po treatment at one time    . Vertigo     Family History  Problem Relation Age of Onset  . Uterine cancer Mother   . Diabetes type II Sister   . Diabetes Mellitus II Brother     Past Surgical History:  Procedure Laterality Date  . ANTERIOR CERVICAL DECOMP/DISCECTOMY FUSION N/A 04/17/2016   Procedure: ANTERIOR CERVICAL DECOMPRESSION/DISCECTOMY FUSION CERVICAL THREE - CERVICAL FOUR, POSTERIOR CERVICAL DISCECTOMY CERVICAL  SEVEN -THORASIC ONE LEFT;  Surgeon: Ashok Pall, MD;  Location: [facility];  Service: Neurosurgery;  Laterality: N/A;  Anterior/Posterior  . ANTERIOR FUSION CERVICAL SPINE  2000   Dr. Carloyn Manner; "for bone spurs; put hardware in too"  . BACK SURGERY    . LEFT HEART CATH AND CORONARY ANGIOGRAPHY N/A 03/17/2018   Procedure: LEFT HEART CATH AND CORONARY ANGIOGRAPHY;  Surgeon: Lorretta Harp, MD;  Location: [facility];  Service: Cardiovascular;  Laterality: N/A;  . LUMBAR LAMINECTOMY Left 06/30/2013   Procedure: Left L3-4 Extraforaminal approach to excise far lateral herniated nucleus pulposus;  Surgeon: Jessy Oto, MD;  Location: [facility];  Service: Orthopedics;  Laterality: Left;  . TUBAL LIGATION    .  VAGINAL HYSTERECTOMY     Social History   Occupational History  . Not on file  Tobacco Use  . Smoking status: Former Smoker    Packs/day: 0.50    Years: 20.00    Pack years: 10.00    Types: Cigarettes    Quit date: 06/30/1982    Years since quitting: 37.0  . Smokeless tobacco: Never Used  Substance and Sexual Activity  . Alcohol use: Yes    Comment: 05/04/2016 "might have a drink a couple days/year; might not"  . Drug use: No  . Sexual activity: Not Currently

## 2019-06-28 NOTE — Addendum Note (Signed)
Addended by: Basil Dess on: 06/28/2019 11:28 AM   Modules accepted: Orders

## 2019-07-04 ENCOUNTER — Other Ambulatory Visit: Payer: Self-pay | Admitting: Cardiovascular Disease

## 2019-07-10 DIAGNOSIS — H40013 Open angle with borderline findings, low risk, bilateral: Secondary | ICD-10-CM | POA: Diagnosis not present

## 2019-07-10 DIAGNOSIS — H25013 Cortical age-related cataract, bilateral: Secondary | ICD-10-CM | POA: Diagnosis not present

## 2019-07-10 DIAGNOSIS — H469 Unspecified optic neuritis: Secondary | ICD-10-CM | POA: Diagnosis not present

## 2019-07-10 DIAGNOSIS — H2513 Age-related nuclear cataract, bilateral: Secondary | ICD-10-CM | POA: Diagnosis not present

## 2019-07-13 ENCOUNTER — Other Ambulatory Visit: Payer: Self-pay

## 2019-07-13 ENCOUNTER — Encounter: Payer: Self-pay | Admitting: Family Medicine

## 2019-07-13 ENCOUNTER — Ambulatory Visit (INDEPENDENT_AMBULATORY_CARE_PROVIDER_SITE_OTHER): Payer: Medicare Other | Admitting: Family Medicine

## 2019-07-13 VITALS — BP 120/76 | HR 54 | Temp 97.1°F | Wt 175.0 lb

## 2019-07-13 DIAGNOSIS — E119 Type 2 diabetes mellitus without complications: Secondary | ICD-10-CM

## 2019-07-13 DIAGNOSIS — Z780 Asymptomatic menopausal state: Secondary | ICD-10-CM | POA: Diagnosis not present

## 2019-07-13 DIAGNOSIS — Z1211 Encounter for screening for malignant neoplasm of colon: Secondary | ICD-10-CM

## 2019-07-13 DIAGNOSIS — E7800 Pure hypercholesterolemia, unspecified: Secondary | ICD-10-CM

## 2019-07-13 DIAGNOSIS — Z0000 Encounter for general adult medical examination without abnormal findings: Secondary | ICD-10-CM | POA: Diagnosis not present

## 2019-07-13 LAB — LIPID PANEL
Cholesterol: 231 mg/dL — ABNORMAL HIGH (ref 0–200)
HDL: 54.3 mg/dL (ref >39.00)
LDL Cholesterol: 158 mg/dL — ABNORMAL HIGH (ref 0–99)
NonHDL: 176.84
Total CHOL/HDL Ratio: 4
Triglycerides: 94 mg/dL (ref 0.0–149.0)
VLDL: 18.8 mg/dL (ref 0.0–40.0)

## 2019-07-13 LAB — BASIC METABOLIC PANEL
BUN: 22 mg/dL (ref 6–23)
CO2: 25 mEq/L (ref 19–32)
Calcium: 9.5 mg/dL (ref 8.4–10.5)
Chloride: 102 mEq/L (ref 96–112)
Creatinine, Ser: 0.97 mg/dL (ref 0.40–1.20)
GFR: 68.14 mL/min (ref >60.00)
Glucose, Bld: 120 mg/dL — ABNORMAL HIGH (ref 70–99)
Potassium: 4.5 mEq/L (ref 3.5–5.1)
Sodium: 137 mEq/L (ref 135–145)

## 2019-07-13 LAB — CBC
HCT: 44.4 % (ref 36.0–46.0)
Hemoglobin: 15 g/dL (ref 12.0–15.0)
MCHC: 33.7 g/dL (ref 30.0–36.0)
MCV: 95.6 fl (ref 78.0–100.0)
Platelets: 259 10*3/uL (ref 150.0–400.0)
RBC: 4.65 Mil/uL (ref 3.87–5.11)
RDW: 13.4 % (ref 11.5–15.5)
WBC: 6.6 10*3/uL (ref 4.0–10.5)

## 2019-07-13 NOTE — Patient Instructions (Signed)
Preventive Care 38 Years and Older, Female Preventive care refers to lifestyle choices and visits with your health care provider that can promote health and wellness. This includes:  A yearly physical exam. This is also called an annual well check.  Regular dental and eye exams.  Immunizations.  Screening for certain conditions.  Healthy lifestyle choices, such as diet and exercise. What can I expect for my preventive care visit? Physical exam Your health care provider will check:  Height and weight. These may be used to calculate body mass index (BMI), which is a measurement that tells if you are at a healthy weight.  Heart rate and blood pressure.  Your skin for abnormal spots. Counseling Your health care provider may ask you questions about:  Alcohol, tobacco, and drug use.  Emotional well-being.  Home and relationship well-being.  Sexual activity.  Eating habits.  History of falls.  Memory and ability to understand (cognition).  Work and work Statistician.  Pregnancy and menstrual history. What immunizations do I need?  Influenza (flu) vaccine  This is recommended every year. Tetanus, diphtheria, and pertussis (Tdap) vaccine  You may need a Td booster every 10 years. Varicella (chickenpox) vaccine  You may need this vaccine if you have not already been vaccinated. Zoster (shingles) vaccine  You may need this after age 33. Pneumococcal conjugate (PCV13) vaccine  One dose is recommended after age 33. Pneumococcal polysaccharide (PPSV23) vaccine  One dose is recommended after age 72. Measles, mumps, and rubella (MMR) vaccine  You may need at least one dose of MMR if you were born in 1957 or later. You may also need a second dose. Meningococcal conjugate (MenACWY) vaccine  You may need this if you have certain conditions. Hepatitis A vaccine  You may need this if you have certain conditions or if you travel or work in places where you may be exposed  to hepatitis A. Hepatitis B vaccine  You may need this if you have certain conditions or if you travel or work in places where you may be exposed to hepatitis B. Haemophilus influenzae type b (Hib) vaccine  You may need this if you have certain conditions. You may receive vaccines as individual doses or as more than one vaccine together in one shot (combination vaccines). Talk with your health care provider about the risks and benefits of combination vaccines. What tests do I need? Blood tests  Lipid and cholesterol levels. These may be checked every 5 years, or more frequently depending on your overall health.  Hepatitis C test.  Hepatitis B test. Screening  Lung cancer screening. You may have this screening every year starting at age 39 if you have a 30-pack-year history of smoking and currently smoke or have quit within the past 15 years.  Colorectal cancer screening. All adults should have this screening starting at age 36 and continuing until age 15. Your health care provider may recommend screening at age 23 if you are at increased risk. You will have tests every 1-10 years, depending on your results and the type of screening test.  Diabetes screening. This is done by checking your blood sugar (glucose) after you have not eaten for a while (fasting). You may have this done every 1-3 years.  Mammogram. This may be done every 1-2 years. Talk with your health care provider about how often you should have regular mammograms.  BRCA-related cancer screening. This may be done if you have a family history of breast, ovarian, tubal, or peritoneal cancers.  Other tests  Sexually transmitted disease (STD) testing.  Bone density scan. This is done to screen for osteoporosis. You may have this done starting at age 66. Follow these instructions at home: Eating and drinking  Eat a diet that includes fresh fruits and vegetables, whole grains, lean protein, and low-fat dairy products. Limit  your intake of foods with high amounts of sugar, saturated fats, and salt.  Take vitamin and mineral supplements as recommended by your health care provider.  Do not drink alcohol if your health care provider tells you not to drink.  If you drink alcohol: ? Limit how much you have to 0-1 drink a day. ? Be aware of how much alcohol is in your drink. In the [place] one drink equals one 12 oz bottle of beer (355 mL), one 5 oz glass of wine (148 mL), or one 1 oz glass of hard liquor (44 mL). Lifestyle  Take daily care of your teeth and gums.  Stay active. Exercise for at least 30 minutes on 5 or more days each week.  Do not use any products that contain nicotine or tobacco, such as cigarettes, e-cigarettes, and chewing tobacco. If you need help quitting, ask your health care provider.  If you are sexually active, practice safe sex. Use a condom or other form of protection in order to prevent STIs (sexually transmitted infections).  Talk with your health care provider about taking a low-dose aspirin or statin. What's next?  Go to your health care provider once a year for a well check visit.  Ask your health care provider how often you should have your eyes and teeth checked.  Stay up to date on all vaccines. This information is not intended to replace advice given to you by your health care provider. Make sure you discuss any questions you have with your health care provider. Document Revised: 04/28/2018 Document Reviewed: 04/28/2018 Elsevier Patient Education  2020 [place] [employer].  Diabetes Mellitus and Standards of Medical Care Managing diabetes (diabetes mellitus) can be complicated. Your diabetes treatment may be managed by a team of health care providers, including:  A physician who specializes in diabetes (endocrinologist).  A nurse practitioner or physician assistant.  Nurses.  A diet and nutrition specialist (registered dietitian).  A certified diabetes educator  (CDE).  An exercise specialist.  A pharmacist.  An eye doctor.  A foot specialist (podiatrist).  A dentist.  A primary care provider.  A mental health provider. Your health care providers follow guidelines to help you get the best quality of care. The following schedule is a general guideline for your diabetes management plan. Your health care providers may give you more specific instructions. Physical exams Upon being diagnosed with diabetes mellitus, and each year after that, your health care provider will ask about your medical and family history. He or she will also do a physical exam. Your exam may include:  Measuring your height, weight, and body mass index (BMI).  Checking your blood pressure. This will be done at every routine medical visit. Your target blood pressure may vary depending on your medical conditions, your age, and other factors.  Thyroid gland exam.  Skin exam.  Screening for damage to your nerves (peripheral neuropathy). This may include checking the pulse in your legs and feet and checking the level of sensation in your hands and feet.  A complete foot exam to inspect the structure and skin of your feet, including checking for cuts, bruises, redness, blisters, sores, or other problems.  Screening for blood vessel (vascular) problems, which may include checking the pulse in your legs and feet and checking your temperature. Blood tests Depending on your treatment plan and your personal needs, you may have the following tests done:  HbA1c (hemoglobin A1c). This test provides information about blood sugar (glucose) control over the previous 2-3 months. It is used to adjust your treatment plan, if needed. This test will be done: ? At least 2 times a year, if you are meeting your treatment goals. ? 4 times a year, if you are not meeting your treatment goals or if treatment goals have changed.  Lipid testing, including total, LDL, and HDL cholesterol and  triglyceride levels. ? The goal for LDL is less than 100 mg/dL (5.5 mmol/L). If you are at high risk for complications, the goal is less than 70 mg/dL (3.9 mmol/L). ? The goal for HDL is 40 mg/dL (2.2 mmol/L) or higher for men and 50 mg/dL (2.8 mmol/L) or higher for women. An HDL cholesterol of 60 mg/dL (3.3 mmol/L) or higher gives some protection against heart disease. ? The goal for triglycerides is less than 150 mg/dL (8.3 mmol/L).  Liver function tests.  Kidney function tests.  Thyroid function tests. Dental and eye exams  Visit your dentist two times a year.  If you have type 1 diabetes, your health care provider may recommend an eye exam 3-5 years after you are diagnosed, and then once a year after your first exam. ? For children with type 1 diabetes, a health care provider may recommend an eye exam when your child is age 34 or older and has had diabetes for 3-5 years. After the first exam, your child should get an eye exam once a year.  If you have type 2 diabetes, your health care provider may recommend an eye exam as soon as you are diagnosed, and then once a year after your first exam. Immunizations   The yearly flu (influenza) vaccine is recommended for everyone 6 months or older who has diabetes.  The pneumonia (pneumococcal) vaccine is recommended for everyone 2 years or older who has diabetes. If you are 10 or older, you may get the pneumonia vaccine as a series of two separate shots.  The hepatitis B vaccine is recommended for adults shortly after being diagnosed with diabetes.  Adults and children with diabetes should receive all other vaccines according to age-specific recommendations from the Centers for Disease Control and Prevention (CDC). Mental and emotional health Screening for symptoms of eating disorders, anxiety, and depression is recommended at the time of diagnosis and afterward as needed. If your screening shows that you have symptoms (positive screening  result), you may need more evaluation and you may work with a mental health care provider. Treatment plan Your treatment plan will be reviewed at every medical visit. You and your health care provider will discuss:  How you are taking your medicines, including insulin.  Any side effects you are experiencing.  Your blood glucose target goals.  The frequency of your blood glucose monitoring.  Lifestyle habits, such as activity level as well as tobacco, alcohol, and substance use. Diabetes self-management education Your health care provider will assess how well you are monitoring your blood glucose levels and whether you are taking your insulin correctly. He or she may refer you to:  A certified diabetes educator to manage your diabetes throughout your life, starting at diagnosis.  A registered dietitian who can create or review your personal nutrition plan.  An exercise specialist who can discuss your activity level and exercise plan. Summary  Managing diabetes (diabetes mellitus) can be complicated. Your diabetes treatment may be managed by a team of health care providers.  Your health care providers follow guidelines in order to help you get the best quality of care.  Standards of care including having regular physical exams, blood tests, blood pressure monitoring, immunizations, screening tests, and education about how to manage your diabetes.  Your health care providers may also give you more specific instructions based on your individual health. This information is not intended to replace advice given to you by your health care provider. Make sure you discuss any questions you have with your health care provider. Document Revised: 01/21/2018 Document Reviewed: 01/31/2016 Elsevier Patient Education  [place].  Diabetes Mellitus and Nutrition, Adult When you have diabetes (diabetes mellitus), it is very important to have healthy eating habits because your blood sugar  (glucose) levels are greatly affected by what you eat and drink. Eating healthy foods in the appropriate amounts, at about the same times every day, can help you:  Control your blood glucose.  Lower your risk of heart disease.  Improve your blood pressure.  Reach or maintain a healthy weight. Every person with diabetes is different, and each person has different needs for a meal plan. Your health care provider may recommend that you work with a diet and nutrition specialist (dietitian) to make a meal plan that is best for you. Your meal plan may vary depending on factors such as:  The calories you need.  The medicines you take.  Your weight.  Your blood glucose, blood pressure, and cholesterol levels.  Your activity level.  Other health conditions you have, such as heart or kidney disease. How do carbohydrates affect me? Carbohydrates, also called carbs, affect your blood glucose level more than any other type of food. Eating carbs naturally raises the amount of glucose in your blood. Carb counting is a method for keeping track of how many carbs you eat. Counting carbs is important to keep your blood glucose at a healthy level, especially if you use insulin or take certain oral diabetes medicines. It is important to know how many carbs you can safely have in each meal. This is different for every person. Your dietitian can help you calculate how many carbs you should have at each meal and for each snack. Foods that contain carbs include:  Bread, cereal, rice, pasta, and crackers.  Potatoes and corn.  Peas, beans, and lentils.  Milk and yogurt.  Fruit and juice.  Desserts, such as cakes, cookies, ice cream, and candy. How does alcohol affect me? Alcohol can cause a sudden decrease in blood glucose (hypoglycemia), especially if you use insulin or take certain oral diabetes medicines. Hypoglycemia can be a life-threatening condition. Symptoms of hypoglycemia (sleepiness,  dizziness, and confusion) are similar to symptoms of having too much alcohol. If your health care provider says that alcohol is safe for you, follow these guidelines:  Limit alcohol intake to no more than 1 drink per day for nonpregnant women and 2 drinks per day for men. One drink equals 12 oz of beer, 5 oz of wine, or 1 oz of hard liquor.  Do not drink on an empty stomach.  Keep yourself hydrated with water, diet soda, or unsweetened iced tea.  Keep in mind that regular soda, juice, and other mixers may contain a lot of sugar and must be counted as carbs. What  are tips for following this plan?  Reading food labels  Start by checking the serving size on the "Nutrition Facts" label of packaged foods and drinks. The amount of calories, carbs, fats, and other nutrients listed on the label is based on one serving of the item. Many items contain more than one serving per package.  Check the total grams (g) of carbs in one serving. You can calculate the number of servings of carbs in one serving by dividing the total carbs by 15. For example, if a food has 30 g of total carbs, it would be equal to 2 servings of carbs.  Check the number of grams (g) of saturated and trans fats in one serving. Choose foods that have low or no amount of these fats.  Check the number of milligrams (mg) of salt (sodium) in one serving. Most people should limit total sodium intake to less than 2,300 mg per day.  Always check the nutrition information of foods labeled as "low-fat" or "nonfat". These foods may be higher in added sugar or refined carbs and should be avoided.  Talk to your dietitian to identify your daily goals for nutrients listed on the label. Shopping  Avoid buying canned, premade, or processed foods. These foods tend to be high in fat, sodium, and added sugar.  Shop around the outside edge of the grocery store. This includes fresh fruits and vegetables, bulk grains, fresh meats, and fresh  dairy. Cooking  Use low-heat cooking methods, such as baking, instead of high-heat cooking methods like deep frying.  Cook using healthy oils, such as olive, canola, or sunflower oil.  Avoid cooking with butter, cream, or high-fat meats. Meal planning  Eat meals and snacks regularly, preferably at the same times every day. Avoid going long periods of time without eating.  Eat foods high in fiber, such as fresh fruits, vegetables, beans, and whole grains. Talk to your dietitian about how many servings of carbs you can eat at each meal.  Eat 4-6 ounces (oz) of lean protein each day, such as lean meat, chicken, fish, eggs, or tofu. One oz of lean protein is equal to: ? 1 oz of meat, chicken, or fish. ? 1 egg. ?  cup of tofu.  Eat some foods each day that contain healthy fats, such as avocado, nuts, seeds, and fish. Lifestyle  Check your blood glucose regularly.  Exercise regularly as told by your health care provider. This may include: ? 150 minutes of moderate-intensity or vigorous-intensity exercise each week. This could be brisk walking, biking, or water aerobics. ? Stretching and doing strength exercises, such as yoga or weightlifting, at least 2 times a week.  Take medicines as told by your health care provider.  Do not use any products that contain nicotine or tobacco, such as cigarettes and e-cigarettes. If you need help quitting, ask your health care provider.  Work with a Social worker or diabetes educator to identify strategies to manage stress and any emotional and social challenges. Questions to ask a health care provider  Do I need to meet with a diabetes educator?  Do I need to meet with a dietitian?  What number can I call if I have questions?  When are the best times to check my blood glucose? Where to find more information:  American Diabetes Association: diabetes.org  Academy of Nutrition and Dietetics: www.eatright.[employer] of Diabetes and  Digestive and Kidney Diseases (NIH): [employer] Summary  A healthy meal plan will help you control  your blood glucose and maintain a healthy lifestyle.  Working with a diet and nutrition specialist (dietitian) can help you make a meal plan that is best for you.  Keep in mind that carbohydrates (carbs) and alcohol have immediate effects on your blood glucose levels. It is important to count carbs and to use alcohol carefully. This information is not intended to replace advice given to you by your health care provider. Make sure you discuss any questions you have with your health care provider. Document Revised: 04/16/2017 Document Reviewed: 06/08/2016 Elsevier Patient Education  2020 [employer].
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
## 2019-07-14 ENCOUNTER — Telehealth: Payer: Self-pay

## 2019-07-14 LAB — HEMOGLOBIN A1C: Hgb A1c MFr Bld: 6.7 % — ABNORMAL HIGH (ref 4.6–6.5)

## 2019-07-14 NOTE — Telephone Encounter (Signed)
Pt Cologuard form was faxed to [employer]

## 2019-08-09 ENCOUNTER — Encounter: Payer: Self-pay | Admitting: Specialist

## 2019-08-09 ENCOUNTER — Ambulatory Visit: Payer: Medicare Other | Admitting: Specialist

## 2019-08-09 ENCOUNTER — Other Ambulatory Visit: Payer: Self-pay

## 2019-08-09 VITALS — BP 139/82 | HR 55 | Ht 68.75 in | Wt 181.0 lb

## 2019-08-09 DIAGNOSIS — M1712 Unilateral primary osteoarthritis, left knee: Secondary | ICD-10-CM

## 2019-08-09 DIAGNOSIS — M48062 Spinal stenosis, lumbar region with neurogenic claudication: Secondary | ICD-10-CM

## 2019-08-09 DIAGNOSIS — M1711 Unilateral primary osteoarthritis, right knee: Secondary | ICD-10-CM | POA: Diagnosis not present

## 2019-08-09 DIAGNOSIS — M160 Bilateral primary osteoarthritis of hip: Secondary | ICD-10-CM

## 2019-08-09 DIAGNOSIS — M4156 Other secondary scoliosis, lumbar region: Secondary | ICD-10-CM | POA: Diagnosis not present

## 2019-08-09 DIAGNOSIS — M1611 Unilateral primary osteoarthritis, right hip: Secondary | ICD-10-CM

## 2019-08-09 MED ORDER — MELOXICAM 15 MG PO TABS: 7.5000 mg | ORAL_TABLET | Freq: Every day | ORAL | 0 refills | Status: DC

## 2019-08-09 NOTE — Progress Notes (Signed)
Office Visit Note   Patient: Angela Pugh           Date of Birth: 12-31-1945           MRN: [id number] Visit Date: 08/09/2019              Requested by: [name], MD [place],  [place] [id number] PCP: [name], MD   Assessment & Plan: Visit Diagnoses:  1. Unilateral primary osteoarthritis, right knee   2. Unilateral primary osteoarthritis, left knee   3. Bilateral primary osteoarthritis of hip   4. Spinal stenosis of lumbar region with neurogenic claudication   5. Other secondary scoliosis, lumbar region     Plan: Avoid bending, stooping and avoid lifting weights greater than 10 lbs. Avoid prolong standing and walking. Avoid frequent bending and stooping  No lifting greater than 10 lbs. May use ice or moist heat for pain. Weight loss is of benefit. Handicap license is approved. Dr. Romona Curls secretary/Assistant will call to arrange for right hip steroid and marcaine injection  The main ways of treat osteoarthritis, that are found to be success. Weight loss helps to decrease pain. Exercise is important to maintaining cartilage and thickness and strengthening. NSAIDs like mobic or meloxicam motrin, tylenol, alleve are meds decreasing the inflamation. Ice is okay  In afternoon and evening and hot shower in the am Knee is suffering from osteoarthritis, only real proven treatments are Weight loss, NSIADs like diclofenac and exercise. Well padded shoes help. Ice the knee that is suffering from osteoarthritis, only real proven treatments are Weight loss, NSIADsand exercise. Well padded shoes help. Ice the knee 2-3 times a day 15-20 mins at a time.-3 times a day 15-20 mins at a time. Hot showers in the AM.  Injection with steroid may be of benefit. Hemp CBD capsules, amazon.com 5,000-7,000 mg per bottle, 60 capsules per bottle, take one capsule twice a day. Cane in the left hand to use with left leg weight bearing.  Follow-Up Instructions:  Return in about 4 weeks (around 09/06/2019).   Orders:  No orders of the defined types were placed in this encounter.  No orders of the defined types were placed in this encounter.     Procedures: No procedures performed   Clinical Data: No additional findings.   Subjective: Chief Complaint  Patient presents with  . Lower Back - Pain  . Neck - Follow-up    74 year old female with bilateral knee osteoarthritis and lumbago with right anterolateral thigh pain some worse with standing and walking. No bowel or bladder difficulty. She is having stiffness in the knees and some trouble with walking up or down stairs. She is able to reach her shoes and socks but has to pick up the right leg to get to her right foot in a sitting position. 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 She has pain with standing and walking. Has motrin that she takes   Review of Systems  Constitutional: Negative.  Negative for activity change, appetite change, chills, diaphoresis, fatigue, fever and unexpected weight change.  HENT: Positive for congestion, rhinorrhea, sinus pain and sneezing. Negative for dental problem, drooling, ear discharge, ear pain, facial swelling, hearing loss, mouth sores, nosebleeds, postnasal drip, sinus pressure, sore throat, tinnitus, trouble swallowing and voice change.   Eyes: Positive for redness and itching.  Respiratory: Positive for shortness of breath. Negative for apnea, cough, choking, chest tightness, wheezing and stridor.  Cardiovascular: Negative.  Negative for chest pain, palpitations and leg swelling.  Gastrointestinal: Negative.  Negative for abdominal distention, abdominal pain, anal bleeding, blood in stool and constipation.  Endocrine: Negative for cold intolerance, heat intolerance, polydipsia, polyphagia and polyuria.  Genitourinary: Negative for difficulty urinating, dyspareunia, dysuria, enuresis, flank pain and hematuria.  Musculoskeletal: Positive for  arthralgias, back pain and gait problem. Negative for joint swelling, myalgias, neck pain and neck stiffness.  Skin: Negative.  Negative for color change, pallor, rash and wound.  Allergic/Immunologic: Positive for environmental allergies.     Objective: Vital Signs: BP 139/82 (BP Location: Left Arm, Patient Position: Sitting)   Pulse (!) 55   Ht 5' 8.75" (1.746 m)   Wt 181 lb (82.1 kg)   BMI 26.92 kg/m   Physical Exam Constitutional:      Appearance: She is well-developed.  HENT:     Head: Normocephalic and atraumatic.  Eyes:     Pupils: Pupils are equal, round, and reactive to light.  Pulmonary:     Effort: Pulmonary effort is normal.     Breath sounds: Normal breath sounds.  Abdominal:     General: Bowel sounds are normal.     Palpations: Abdomen is soft.  Musculoskeletal:     Cervical back: Normal range of motion and neck supple.     Lumbar back: Negative right straight leg raise test and negative left straight leg raise test.  Skin:    General: Skin is warm and dry.  Neurological:     Mental Status: She is alert and oriented to person, place, and time.  Psychiatric:        Behavior: Behavior normal.        Thought Content: Thought content normal.        Judgment: Judgment normal.     Back Exam   Tenderness  The patient is experiencing tenderness in the lumbar.  Range of Motion  Extension: abnormal  Flexion: abnormal  Lateral bend right: abnormal  Lateral bend left: abnormal  Rotation right: abnormal  Rotation left: abnormal   Muscle Strength  Right Quadriceps:  5/5  Left Quadriceps:  5/5  Right Hamstrings:  5/5  Left Hamstrings:  5/5   Tests  Straight leg raise right: negative Straight leg raise left: negative  Reflexes  Patellar: 2/4 Achilles: 2/4 Babinski's sign: normal   Other  Erythema: no back redness Scars: present      Specialty Comments:  No specialty comments available.  Imaging: No results found.   PMFS  History: Patient Active Problem List   Diagnosis Date Noted  . Herniated lumbar intervertebral disc 06/30/2013    Priority: High    Class: Acute  . Nonischemic cardiomyopathy ([facility]) 04/12/2018  . Chest pain 01/25/2018  . Left bundle branch block 01/25/2018  . Carotid artery disease ([facility]) 01/25/2018  . Seasonal allergies 10/18/2017  . Arthritis 10/18/2017  . Pre-diabetes 10/18/2017  . Protein-calorie malnutrition, severe 05/05/2016  . Dysphagia 05/04/2016  . HNP (herniated nucleus pulposus) with myelopathy, cervical 04/17/2016  . Abdominal pain 02/01/2016  . Acute diverticulitis 02/01/2016  . HLD (hyperlipidemia) 02/01/2016  . Diabetes mellitus without complication ([facility]) [id number]  . Hypokalemia 02/01/2016  . Hypertension   . Essential hypertension   . Cervical spondylosis without myelopathy 01/13/2016  . Bilateral carpal tunnel syndrome 01/13/2016   Past Medical History:  Diagnosis Date  . Arthritis    back, knees, shoulder, hands , injections in pelvis, for bone spurs (05/04/2016)  . Chronic back pain   .  Diverticulosis   . History of kidney stones   . HLD (hyperlipidemia)   . Hypertension   . Migraine    hx (05/04/2016)  . Peripheral neuropathy   . Radiculopathy   . Sinus complaint   . Sinus headache    occasional (05/04/2016)  . Type II diabetes mellitus ([facility])    diet controlled, no meds now, was on insulin & po treatment at one time    . Vertigo     Family History  Problem Relation Age of Onset  . Uterine cancer Mother   . Diabetes type II Sister   . Diabetes Mellitus II Brother     Past Surgical History:  Procedure Laterality Date  . ANTERIOR CERVICAL DECOMP/DISCECTOMY FUSION N/A 04/17/2016   Procedure: ANTERIOR CERVICAL DECOMPRESSION/DISCECTOMY FUSION CERVICAL THREE - CERVICAL FOUR, POSTERIOR CERVICAL DISCECTOMY CERVICAL SEVEN -THORASIC ONE LEFT;  Surgeon: Ashok Pall, MD;  Location: [facility];  Service: Neurosurgery;  Laterality: N/A;  Anterior/Posterior  .  ANTERIOR FUSION CERVICAL SPINE  2000   Dr. Carloyn Manner; "for bone spurs; put hardware in too"  . BACK SURGERY    . LEFT HEART CATH AND CORONARY ANGIOGRAPHY N/A 03/17/2018   Procedure: LEFT HEART CATH AND CORONARY ANGIOGRAPHY;  Surgeon: Lorretta Harp, MD;  Location: [facility];  Service: Cardiovascular;  Laterality: N/A;  . LUMBAR LAMINECTOMY Left 06/30/2013   Procedure: Left L3-4 Extraforaminal approach to excise far lateral herniated nucleus pulposus;  Surgeon: Jessy Oto, MD;  Location: [facility];  Service: Orthopedics;  Laterality: Left;  . TUBAL LIGATION    . VAGINAL HYSTERECTOMY     Social History   Occupational History  . Not on file  Tobacco Use  . Smoking status: Former Smoker    Packs/day: 0.50    Years: 20.00    Pack years: 10.00    Types: Cigarettes    Quit date: 06/30/1982    Years since quitting: 37.1  . Smokeless tobacco: Never Used  Substance and Sexual Activity  . Alcohol use: Yes    Comment: 05/04/2016 "might have a drink a couple days/year; might not"  . Drug use: No  . Sexual activity: Not Currently

## 2019-08-09 NOTE — Patient Instructions (Signed)
Avoid bending, stooping and avoid lifting weights greater than 10 lbs. Avoid prolong standing and walking. Avoid frequent bending and stooping  No lifting greater than 10 lbs. May use ice or moist heat for pain. Weight loss is of benefit. Handicap license is approved. Dr. Romona Curls secretary/Assistant will call to arrange for right hip steroid and marcaine injection  The main ways of treat osteoarthritis, that are found to be success. Weight loss helps to decrease pain. Exercise is important to maintaining cartilage and thickness and strengthening. NSAIDs like mobic or meloxicam motrin, tylenol, alleve are meds decreasing the inflamation. Ice is okay  In afternoon and evening and hot shower in the am Knee is suffering from osteoarthritis, only real proven treatments are Weight loss, NSIADs like diclofenac and exercise. Well padded shoes help. Ice the knee that is suffering from osteoarthritis, only real proven treatments are Weight loss, NSIADsand exercise. Well padded shoes help. Ice the knee 2-3 times a day 15-20 mins at a time.-3 times a day 15-20 mins at a time. Hot showers in the AM.  Injection with steroid may be of benefit. Hemp CBD capsules, amazon.com 5,000-7,000 mg per bottle, 60 capsules per bottle, take one capsule twice a day. Cane in the left hand to use with left leg weight bearing. Follow-Up Instructions: No follow-ups on file.

## 2019-08-14 DIAGNOSIS — Z1211 Encounter for screening for malignant neoplasm of colon: Secondary | ICD-10-CM | POA: Diagnosis not present

## 2019-08-15 ENCOUNTER — Other Ambulatory Visit: Payer: Self-pay | Admitting: Family Medicine

## 2019-08-15 DIAGNOSIS — E2839 Other primary ovarian failure: Secondary | ICD-10-CM

## 2019-08-17 ENCOUNTER — Other Ambulatory Visit: Payer: Self-pay

## 2019-08-17 ENCOUNTER — Ambulatory Visit
Admission: RE | Admit: 2019-08-17 | Discharge: 2019-08-17 | Disposition: A | Discharge status: Home / Self Care | Payer: Medicare Other | Source: Ambulatory Visit | Attending: Family Medicine | Admitting: Family Medicine

## 2019-08-17 DIAGNOSIS — Z780 Asymptomatic menopausal state: Secondary | ICD-10-CM | POA: Diagnosis not present

## 2019-08-17 DIAGNOSIS — E2839 Other primary ovarian failure: Secondary | ICD-10-CM

## 2019-08-17 LAB — COLOGUARD: Cologuard: NEGATIVE

## 2019-08-21 ENCOUNTER — Encounter: Payer: Self-pay | Admitting: Family Medicine

## 2019-08-23 ENCOUNTER — Other Ambulatory Visit: Payer: Self-pay

## 2019-08-23 ENCOUNTER — Encounter: Payer: Self-pay | Admitting: Podiatry

## 2019-08-23 ENCOUNTER — Ambulatory Visit: Payer: Medicare Other | Admitting: Podiatry

## 2019-08-23 VITALS — Temp 96.0°F

## 2019-08-23 DIAGNOSIS — E1151 Type 2 diabetes mellitus with diabetic peripheral angiopathy without gangrene: Secondary | ICD-10-CM | POA: Diagnosis not present

## 2019-08-23 DIAGNOSIS — M79674 Pain in right toe(s): Secondary | ICD-10-CM | POA: Diagnosis not present

## 2019-08-23 DIAGNOSIS — B351 Tinea unguium: Secondary | ICD-10-CM | POA: Diagnosis not present

## 2019-08-23 DIAGNOSIS — Q828 Other specified congenital malformations of skin: Secondary | ICD-10-CM

## 2019-08-23 DIAGNOSIS — M79675 Pain in left toe(s): Secondary | ICD-10-CM

## 2019-08-23 NOTE — Patient Instructions (Signed)
Diabetes Mellitus and Foot Care Foot care is an important part of your health, especially when you have diabetes. Diabetes may cause you to have problems because of poor blood flow (circulation) to your feet and legs, which can cause your skin to:  Become thinner and drier.  Break more easily.  Heal more slowly.  Peel and crack. You may also have nerve damage (neuropathy) in your legs and feet, causing decreased feeling in them. This means that you may not notice minor injuries to your feet that could lead to more serious problems. Noticing and addressing any potential problems early is the best way to prevent future foot problems. How to care for your feet Foot hygiene  Wash your feet daily with warm water and mild soap. Do not use hot water. Then, pat your feet and the areas between your toes until they are completely dry. Do not soak your feet as this can dry your skin.  Trim your toenails straight across. Do not dig under them or around the cuticle. File the edges of your nails with an emery board or nail file.  Apply a moisturizing lotion or petroleum jelly to the skin on your feet and to dry, brittle toenails. Use lotion that does not contain alcohol and is unscented. Do not apply lotion between your toes. Shoes and socks  Wear clean socks or stockings every day. Make sure they are not too tight. Do not wear knee-high stockings since they may decrease blood flow to your legs.  Wear shoes that fit properly and have enough cushioning. Always look in your shoes before you put them on to be sure there are no objects inside.  To break in new shoes, wear them for just a few hours a day. This prevents injuries on your feet. Wounds, scrapes, corns, and calluses  Check your feet daily for blisters, cuts, bruises, sores, and redness. If you cannot see the bottom of your feet, use a mirror or ask someone for help.  Do not cut corns or calluses or try to remove them with medicine.  If you  find a minor scrape, cut, or break in the skin on your feet, keep it and the skin around it clean and dry. You may clean these areas with mild soap and water. Do not clean the area with peroxide, alcohol, or iodine.  If you have a wound, scrape, corn, or callus on your foot, look at it several times a day to make sure it is healing and not infected. Check for: ? Redness, swelling, or pain. ? Fluid or blood. ? Warmth. ? Pus or a bad smell. General instructions  Do not cross your legs. This may decrease blood flow to your feet.  Do not use heating pads or hot water bottles on your feet. They may burn your skin. If you have lost feeling in your feet or legs, you may not know this is happening until it is too late.  Protect your feet from hot and cold by wearing shoes, such as at the beach or on hot pavement.  Schedule a complete foot exam at least once a year (annually) or more often if you have foot problems. If you have foot problems, report any cuts, sores, or bruises to your health care provider immediately. Contact a health care provider if:  You have a medical condition that increases your risk of infection and you have any cuts, sores, or bruises on your feet.  You have an injury that is not   healing.  You have redness on your legs or feet.  You feel burning or tingling in your legs or feet.  You have pain or cramps in your legs and feet.  Your legs or feet are numb.  Your feet always feel cold.  You have pain around a toenail. Get help right away if:  You have a wound, scrape, corn, or callus on your foot and: ? You have pain, swelling, or redness that gets worse. ? You have fluid or blood coming from the wound, scrape, corn, or callus. ? Your wound, scrape, corn, or callus feels warm to the touch. ? You have pus or a bad smell coming from the wound, scrape, corn, or callus. ? You have a fever. ? You have a red line going up your leg. Summary  Check your feet every day  for cuts, sores, red spots, swelling, and blisters.  Moisturize feet and legs daily.  Wear shoes that fit properly and have enough cushioning.  If you have foot problems, report any cuts, sores, or bruises to your health care provider immediately.  Schedule a complete foot exam at least once a year (annually) or more often if you have foot problems. This information is not intended to replace advice given to you by your health care provider. Make sure you discuss any questions you have with your health care provider. Document Revised: 01/25/2019 Document Reviewed: 06/05/2016 Elsevier Patient Education  2020 [employer]  

## 2019-08-25 ENCOUNTER — Telehealth: Payer: Self-pay | Admitting: Family Medicine

## 2019-08-25 ENCOUNTER — Ambulatory Visit (INDEPENDENT_AMBULATORY_CARE_PROVIDER_SITE_OTHER): Payer: Medicare Other | Admitting: Physical Medicine and Rehabilitation

## 2019-08-25 ENCOUNTER — Encounter: Payer: Self-pay | Admitting: Physical Medicine and Rehabilitation

## 2019-08-25 ENCOUNTER — Telehealth (INDEPENDENT_AMBULATORY_CARE_PROVIDER_SITE_OTHER): Payer: Medicare Other | Admitting: Family Medicine

## 2019-08-25 ENCOUNTER — Ambulatory Visit: Payer: Self-pay

## 2019-08-25 ENCOUNTER — Other Ambulatory Visit: Payer: Self-pay

## 2019-08-25 ENCOUNTER — Encounter: Payer: Self-pay | Admitting: Family Medicine

## 2019-08-25 DIAGNOSIS — M25551 Pain in right hip: Secondary | ICD-10-CM

## 2019-08-25 DIAGNOSIS — J302 Other seasonal allergic rhinitis: Secondary | ICD-10-CM

## 2019-08-25 DIAGNOSIS — J0100 Acute maxillary sinusitis, unspecified: Secondary | ICD-10-CM

## 2019-08-25 MED ORDER — AMOXICILLIN-POT CLAVULANATE 500-125 MG PO TABS: 1.0000 | ORAL_TABLET | Freq: Two times a day (BID) | ORAL | 0 refills | Status: AC

## 2019-08-25 MED ORDER — LORATADINE 10 MG PO TABS: 10.0000 mg | ORAL_TABLET | Freq: Every day | ORAL | 11 refills | Status: DC

## 2019-08-25 NOTE — Progress Notes (Signed)
Virtual Visit via Telephone Note  I connected with Angela Pugh on 08/25/19 at  3:00 PM EDT by telephone and verified that I am speaking with the correct person using two identifiers.   I discussed the limitations, risks, security and privacy concerns of performing an evaluation and management service by telephone and the availability of in person appointments. I also discussed with the patient that there may be a patient responsible charge related to this service. The patient expressed understanding and agreed to proceed.  Location patient: home Location provider: work or home office Participants present for the call: patient, provider Patient did not have a visit in the prior 7 days to address this/these issue(s).   History of Present Illness: Pt is a 74 yo female with pmh sig for NICM, LBBB, HTN, diet controlled DM, CAD, radiculopathy, h/o diverticulitis, carpal tunnel, arthritis.   Pt dealing with sinus and allergy issues for several wks.  Endorses HA, pain under R eye,  rhinorrhea, congestion, clear productive cough, constipation for over 1 wk.  Pt also notes one day of ear pressure and post tussive emesis over night.  Denies tooth pain,sore throat, or sick contacts.  Pt tried flonase.      Observations/Objective: Patient sounds cheerful and well on the phone. I do not appreciate any SOB. Speech and thought processing are grossly intact. Patient reported vitals:  Assessment and Plan: Acute maxillary sinusitis, recurrence not specified  -continue supportive care. -consider COVID testing -given precautinos - Plan: amoxicillin-clavulanate (AUGMENTIN) 500-125 MG tablet  Seasonal allergies  -continue flonase -will add claritin -allergy to zyrtec.  Caused hives. - Plan: loratadine (CLARITIN) 10 MG tablet    Follow Up Instructions:  F/u prn  I did not refer this patient for an OV in the next 24 hours for this/these issue(s).  I discussed the assessment and treatment plan  with the patient. The patient was provided an opportunity to ask questions and all were answered. The patient agreed with the plan and demonstrated an understanding of the instructions.   The patient was advised to call back or seek an in-person evaluation if the symptoms worsen or if the condition fails to improve as anticipated.  I provided 6 minutes of non-face-to-face time during this encounter.   [name], MD

## 2019-08-25 NOTE — Telephone Encounter (Signed)
Pt is scheduled for a telephone visit with Dr Volanda Napoleon this afternoon

## 2019-08-25 NOTE — Telephone Encounter (Signed)
Pt has sinus issues going on and when the sinus drains down her throat it makes her nauseous she would like to know if Dr. Volanda Napoleon could call a prescrption in for her to help with her sinuses.

## 2019-08-25 NOTE — Progress Notes (Signed)
  Numeric Pain Rating Scale and Functional Assessment Average Pain 10   In the last MONTH (on 0-10 scale) has pain interfered with the following?  1. General activity like being  able to carry out your everyday physical activities such as walking, climbing stairs, carrying groceries, or moving a chair?  Rating(10)    -Dye Allergies. 

## 2019-08-28 DIAGNOSIS — M25551 Pain in right hip: Secondary | ICD-10-CM | POA: Diagnosis not present

## 2019-08-28 MED ORDER — BUPIVACAINE HCL 0.25 % IJ SOLN
4.0000 mL | INTRAMUSCULAR | Status: AC | PRN
  Administered 2019-08-28: 4 mL via INTRA_ARTICULAR

## 2019-08-28 MED ORDER — TRIAMCINOLONE ACETONIDE 40 MG/ML IJ SUSP
60.0000 mg | INTRAMUSCULAR | Status: AC | PRN
  Administered 2019-08-28: 60 mg via INTRA_ARTICULAR

## 2019-08-28 NOTE — Progress Notes (Signed)
   Angela Pugh - 74 y.o. female MRN [id number]  Date of birth: [birth year]  Office Visit Note: Visit Date: 08/25/2019 PCP: [name], MD Referred by: [name], MD  Subjective: Chief Complaint  Patient presents with  . Right Hip - Pain   HPI:  Angela Pugh is a 74 y.o. female who comes in today At the request of Dr. Basil Dess for diagnostic and hopefully therapeutic right intra-articular anesthetic hip arthrogram.  Patient is having right hip and groin pain worse with standing and movement.  She has a history of disc herniation.  She has a history of diabetes.  She has no left-sided complaints.  ROS Otherwise per HPI.  Assessment & Plan: Visit Diagnoses:  1. Pain in right hip     Plan: Findings:  Patient did have good relief during anesthetic portion of the injection.    Meds & Orders: No orders of the defined types were placed in this encounter.   Orders Placed This Encounter  Procedures  . Large Joint Inj  . XR C-ARM NO REPORT    Follow-up: No follow-ups on file.   Procedures: Large Joint Inj: R hip joint on 08/28/2019 5:36 AM Indications: pain and diagnostic evaluation Details: 22 G needle, anterior approach  Arthrogram: Yes  Medications: 4 mL bupivacaine 0.25 %; 60 mg triamcinolone acetonide 40 MG/ML Outcome: tolerated well, no immediate complications  Arthrogram demonstrated excellent flow of contrast throughout the joint surface without extravasation or obvious defect.  The patient had relief of symptoms during the anesthetic phase of the injection.  Procedure, treatment alternatives, risks and benefits explained, specific risks discussed. Consent was given by the patient. Immediately prior to procedure a time out was called to verify the correct patient, procedure, equipment, support staff and site/side marked as required. Patient was prepped and draped in the usual sterile fashion.      No notes on file   Clinical History: No  specialty comments available.     Objective:  VS:  HT:    WT:   BMI:     BP:   HR: bpm  TEMP: ( )  RESP:  Physical Exam  Ortho Exam Imaging: No results found.

## 2019-08-28 NOTE — Progress Notes (Signed)
Subjective: Angela Pugh presents today for follow up of preventative diabetic foot care and painful porokeratotic lesion(s) b/l feet and painful mycotic toenails b/l that limit ambulation. Aggravating factors include weightbearing with and without shoe gear. Pain for both is relieved with periodic professional debridement.   She is inquiring about the status of her diabetic shoes on today's visit.  Allergies  Allergen Reactions  . Acetaminophen Hives and Nausea Only  . Atorvastatin Nausea Only    weakness  . Azithromycin Hives and Nausea And Vomiting  . Zyrtec [Cetirizine]     Hives      Objective: Vitals:   08/23/19 0816  Temp: (!) 37 F (35.6 C)    Pt 74 y.o. year old [place] American female  in NAD. AAO x 3.   Vascular Examination:  Capillary refill time to digits immediate b/l. Palpable DP pulses b/l. Nonpalpable PT pulses b/l. Pedal hair sparse b/l. Skin temperature gradient within normal limits b/l.  Dermatological Examination: Pedal skin with normal turgor, texture and tone bilaterally. No open wounds bilaterally. No interdigital macerations bilaterally. Toenails 1-5 b/l elongated, dystrophic, thickened, crumbly with subungual debris and tenderness to dorsal palpation. Porokeratotic lesion(s) submet head 2 left foot, submet head 2 right foot and plantar aspect right heel. No erythema, no edema, no drainage, no flocculence.  Musculoskeletal: Normal muscle strength 5/5 to all lower extremity muscle groups bilaterally, no pain crepitus or joint limitation noted with ROM b/l and hammertoes noted to the  L 5th toe and R 5th toe.  Neurological: Protective sensation intact 5/5 intact bilaterally with 10g monofilament b/l Vibratory sensation intact b/l Babinski reflex negative b/l Clonus negative b/l.  Assessment: 1. Pain due to onychomycosis of toenails of both feet   2. Porokeratosis   3. Type II diabetes mellitus with peripheral circulatory disorder (HCC)     Plan: -Continue diabetic foot care principles. Literature dispensed on today.  -Toenails 1-5 b/l were debrided in length and girth with sterile nail nippers and dremel without iatrogenic bleeding.  -Painful porokeratotic lesion(s) submet head 2 left foot, submet head 2 right foot and plantar aspect right heel pared and enucleated with sterile scalpel blade without incident. -Her diabetic shoes have been ordered and are still pending delivery to the office. We will call her when they arrive. Patient to continue soft, supportive shoe gear daily. -Patient to report any pedal injuries to medical professional immediately. -Patient/POA to call should there be question/concern in the interim.  Return in about 3 months (around 11/22/2019).

## 2019-09-06 ENCOUNTER — Encounter: Payer: Self-pay | Admitting: Specialist

## 2019-09-06 ENCOUNTER — Ambulatory Visit: Payer: Self-pay

## 2019-09-06 ENCOUNTER — Telehealth: Payer: Self-pay

## 2019-09-06 ENCOUNTER — Ambulatory Visit: Payer: Medicare Other | Admitting: Specialist

## 2019-09-06 ENCOUNTER — Other Ambulatory Visit: Payer: Self-pay

## 2019-09-06 VITALS — BP 135/84 | HR 51 | Ht 68.75 in | Wt 181.0 lb

## 2019-09-06 DIAGNOSIS — M4156 Other secondary scoliosis, lumbar region: Secondary | ICD-10-CM

## 2019-09-06 DIAGNOSIS — M4316 Spondylolisthesis, lumbar region: Secondary | ICD-10-CM | POA: Diagnosis not present

## 2019-09-06 DIAGNOSIS — M160 Bilateral primary osteoarthritis of hip: Secondary | ICD-10-CM

## 2019-09-06 DIAGNOSIS — M542 Cervicalgia: Secondary | ICD-10-CM

## 2019-09-06 DIAGNOSIS — R0781 Pleurodynia: Secondary | ICD-10-CM

## 2019-09-06 DIAGNOSIS — M4802 Spinal stenosis, cervical region: Secondary | ICD-10-CM | POA: Diagnosis not present

## 2019-09-06 DIAGNOSIS — M1711 Unilateral primary osteoarthritis, right knee: Secondary | ICD-10-CM

## 2019-09-06 DIAGNOSIS — M1712 Unilateral primary osteoarthritis, left knee: Secondary | ICD-10-CM

## 2019-09-06 DIAGNOSIS — M4807 Spinal stenosis, lumbosacral region: Secondary | ICD-10-CM

## 2019-09-06 DIAGNOSIS — M546 Pain in thoracic spine: Secondary | ICD-10-CM

## 2019-09-06 DIAGNOSIS — R200 Anesthesia of skin: Secondary | ICD-10-CM

## 2019-09-06 DIAGNOSIS — R202 Paresthesia of skin: Secondary | ICD-10-CM

## 2019-09-06 DIAGNOSIS — Z981 Arthrodesis status: Secondary | ICD-10-CM

## 2019-09-06 NOTE — Telephone Encounter (Signed)
Phone call to patient to verify medication list and allergies for myelogram procedure. Medications pt is currently taking are safe to continue to take. Advised pt if any new medications are started prior to procedure to call and make [place] aware. Pt also instructed to have a driver the day of the procedure, she would be with [place] 2 hours the day of, and she would need to lay flat for at least 24 hours after. Pt verbalized understanding.

## 2019-09-06 NOTE — Progress Notes (Signed)
Office Visit Note   Patient: Angela Pugh           Date of Birth: 02/11/1946           MRN: [id number] Visit Date: 09/06/2019              Requested by: [name], MD [place],  [place] [address] PCP: [name], MD   Assessment & Plan: Visit Diagnoses:  1. Spinal stenosis of cervical region   2. Spondylolisthesis, lumbar region   3. Cervicalgia   4. Other secondary scoliosis, lumbar region   5. Unilateral primary osteoarthritis, left knee   6. Unilateral primary osteoarthritis, right knee   7. Bilateral primary osteoarthritis of hip   8. History of fusion of cervical spine   9. Numbness and tingling of left arm and leg   10. Numbness and tingling of right leg   11. Spinal stenosis of lumbosacral region   12. Rib pain on left side   13. Left-sided thoracic back pain, unspecified chronicity     Plan: Avoid bending, stooping and avoid lifting weights greater than 10 lbs. Avoid prolong standing and walking. Avoid frequent bending and stooping  No lifting greater than 10 lbs. May use ice or moist heat for pain. Weight loss is of benefit. Handicap license is approved. Total spine cervical, thoracic and lumbar myelograms and post myelogram CTscans Scoliosis views of the spine.   Follow-Up Instructions: Return in about 4 weeks (around 10/04/2019).   Orders:  No orders of the defined types were placed in this encounter.  No orders of the defined types were placed in this encounter.     Procedures: No procedures performed   Clinical Data: No additional findings.   Subjective: Chief Complaint  Patient presents with  . Lower Back - Follow-up  . Right Hip - Follow-up  . Left Hip - Follow-up  . Right Knee - Follow-up  . Left Knee - Follow-up    74 year old female with history of cervical spine spondylosis and disc disease post ACDF C3-4  Dr. Christella Noa and posterior decompression C7-T1 and previous ACDF by Dr. Carloyn Manner. Previous  lateral approach for lateral disc herniation L3-4 in 2015 by myself . She reports knee pains are improved but the hips are still painful and the injections given did not provide any relief at all. In fact the pain in the hips is worse and its  My back. My back is painful. There is no pain with cough or sneeze. There is numbness in the legs and there is cramping in both anterior thighs and left hand. She tries to stand and has to some times lie on the floor to decrease the pain. There is swelling in the left knee. She  Takes motrin when the knee pain is bad. No bowel or bladder difficulty, sometimes increased frequency. She has diabetes and relates increased frequency to age and weight and exercise. Blood sugars are presently 99 tested yesterday. Last HgbA1c 6.1. Numbness anterior Thigh and cramping in the anterior thigh. Injection right hip by Dr. [name]. Painful right upper medial buttock and PSIS area tooth ache quality.   Review of Systems  Constitutional: Negative.  Negative for activity change, appetite change, chills, diaphoresis, fatigue, fever and unexpected weight change.  HENT: Negative.  Negative for congestion, dental problem, drooling, ear discharge, ear pain, facial swelling, hearing loss, mouth sores, nosebleeds, postnasal drip, rhinorrhea, sinus pressure, sinus pain, sneezing, sore throat, tinnitus, trouble swallowing and  voice change.   Eyes: Positive for visual disturbance. Negative for photophobia, pain, discharge, redness and itching.  Respiratory: Negative.  Negative for apnea, cough, choking, chest tightness, shortness of breath, wheezing and stridor.   Cardiovascular: Negative.  Negative for chest pain, palpitations and leg swelling.  Gastrointestinal: Negative.  Negative for abdominal distention, abdominal pain, anal bleeding, blood in stool, constipation, diarrhea, nausea, rectal pain and vomiting.  Endocrine: Negative.  Negative for cold intolerance, heat intolerance,  polydipsia, polyphagia and polyuria.  Genitourinary: Positive for frequency. Negative for difficulty urinating, dyspareunia, dysuria, enuresis, flank pain and hematuria.  Musculoskeletal: Positive for arthralgias, back pain, gait problem and joint swelling. Negative for myalgias, neck pain and neck stiffness.  Skin: Negative.  Negative for color change, pallor, rash and wound.  Allergic/Immunologic: Positive for environmental allergies. Negative for food allergies and immunocompromised state.  Neurological: Positive for weakness and numbness. Negative for dizziness, tremors, seizures, syncope, facial asymmetry, speech difficulty, light-headedness and headaches.  Hematological: Negative.   Psychiatric/Behavioral: Negative.      Objective: Vital Signs: BP 135/84 (BP Location: Left Arm, Patient Position: Sitting)   Pulse (!) 51   Ht 5' 8.75" (1.746 m)   Wt 181 lb (82.1 kg)   BMI 26.92 kg/m   Physical Exam  Ortho Exam  Specialty Comments:  No specialty comments available.  Imaging: No results found.   PMFS History: Patient Active Problem List   Diagnosis Date Noted  . Herniated lumbar intervertebral disc 06/30/2013    Priority: High    Class: Acute  . Nonischemic cardiomyopathy ([facility]) 04/12/2018  . Chest pain 01/25/2018  . Left bundle branch block 01/25/2018  . Carotid artery disease ([facility]) 01/25/2018  . Seasonal allergies 10/18/2017  . Arthritis 10/18/2017  . Pre-diabetes 10/18/2017  . Protein-calorie malnutrition, severe 05/05/2016  . Dysphagia 05/04/2016  . HNP (herniated nucleus pulposus) with myelopathy, cervical 04/17/2016  . Abdominal pain 02/01/2016  . Acute diverticulitis 02/01/2016  . HLD (hyperlipidemia) 02/01/2016  . Diabetes mellitus without complication ([facility]) [id number]  . Hypokalemia 02/01/2016  . Hypertension   . Essential hypertension   . Cervical spondylosis without myelopathy 01/13/2016  . Bilateral carpal tunnel syndrome 01/13/2016   Past Medical  History:  Diagnosis Date  . Arthritis    back, knees, shoulder, hands , injections in pelvis, for bone spurs (05/04/2016)  . Chronic back pain   . Diverticulosis   . History of kidney stones   . HLD (hyperlipidemia)   . Hypertension   . Migraine    hx (05/04/2016)  . Peripheral neuropathy   . Radiculopathy   . Sinus complaint   . Sinus headache    occasional (05/04/2016)  . Type II diabetes mellitus (HCC)    diet controlled, no meds now, was on insulin & po treatment at one time    . Vertigo     Family History  Problem Relation Age of Onset  . Uterine cancer Mother   . Diabetes type II Sister   . Diabetes Mellitus II Brother     Past Surgical History:  Procedure Laterality Date  . ANTERIOR CERVICAL DECOMP/DISCECTOMY FUSION N/A 04/17/2016   Procedure: ANTERIOR CERVICAL DECOMPRESSION/DISCECTOMY FUSION CERVICAL THREE - CERVICAL FOUR, POSTERIOR CERVICAL DISCECTOMY CERVICAL SEVEN -THORASIC ONE LEFT;  Surgeon: Ashok Pall, MD;  Location: [facility];  Service: Neurosurgery;  Laterality: N/A;  Anterior/Posterior  . ANTERIOR FUSION CERVICAL SPINE  2000   Dr. Carloyn Manner; "for bone spurs; put hardware in too"  . BACK SURGERY    . LEFT HEART  CATH AND CORONARY ANGIOGRAPHY N/A 03/17/2018   Procedure: LEFT HEART CATH AND CORONARY ANGIOGRAPHY;  Surgeon: Lorretta Harp, MD;  Location: [facility];  Service: Cardiovascular;  Laterality: N/A;  . LUMBAR LAMINECTOMY Left 06/30/2013   Procedure: Left L3-4 Extraforaminal approach to excise far lateral herniated nucleus pulposus;  Surgeon: Jessy Oto, MD;  Location: [facility];  Service: Orthopedics;  Laterality: Left;  . TUBAL LIGATION    . VAGINAL HYSTERECTOMY     Social History   Occupational History  . Not on file  Tobacco Use  . Smoking status: Former Smoker    Packs/day: 0.50    Years: 20.00    Pack years: 10.00    Types: Cigarettes    Quit date: 06/30/1982    Years since quitting: 37.2  . Smokeless tobacco: Never Used  Substance and  Sexual Activity  . Alcohol use: Yes    Comment: 05/04/2016 "might have a drink a couple days/year; might not"  . Drug use: No  . Sexual activity: Not Currently

## 2019-09-06 NOTE — Patient Instructions (Signed)
Avoid bending, stooping and avoid lifting weights greater than 10 lbs. Avoid prolong standing and walking. Avoid frequent bending and stooping  No lifting greater than 10 lbs. May use ice or moist heat for pain. Weight loss is of benefit. Handicap license is approved. Total spine cervical, thoracic and lumbar myelograms and post myelogram CTscans Scoliosis views of the spine.

## 2019-09-12 NOTE — Discharge Instructions (Signed)

## 2019-09-13 ENCOUNTER — Ambulatory Visit
Admission: RE | Admit: 2019-09-13 | Discharge: 2019-09-13 | Disposition: A | Discharge status: Home / Self Care | Payer: Medicare Other | Source: Ambulatory Visit | Attending: Specialist | Admitting: Specialist

## 2019-09-13 ENCOUNTER — Other Ambulatory Visit: Payer: Self-pay

## 2019-09-13 DIAGNOSIS — R0781 Pleurodynia: Secondary | ICD-10-CM

## 2019-09-13 DIAGNOSIS — Z981 Arthrodesis status: Secondary | ICD-10-CM

## 2019-09-13 DIAGNOSIS — M4156 Other secondary scoliosis, lumbar region: Secondary | ICD-10-CM

## 2019-09-13 DIAGNOSIS — M47812 Spondylosis without myelopathy or radiculopathy, cervical region: Secondary | ICD-10-CM | POA: Diagnosis not present

## 2019-09-13 DIAGNOSIS — M4802 Spinal stenosis, cervical region: Secondary | ICD-10-CM

## 2019-09-13 DIAGNOSIS — M546 Pain in thoracic spine: Secondary | ICD-10-CM

## 2019-09-13 DIAGNOSIS — M47814 Spondylosis without myelopathy or radiculopathy, thoracic region: Secondary | ICD-10-CM | POA: Diagnosis not present

## 2019-09-13 DIAGNOSIS — M542 Cervicalgia: Secondary | ICD-10-CM

## 2019-09-13 DIAGNOSIS — M4807 Spinal stenosis, lumbosacral region: Secondary | ICD-10-CM

## 2019-09-13 DIAGNOSIS — M48061 Spinal stenosis, lumbar region without neurogenic claudication: Secondary | ICD-10-CM | POA: Diagnosis not present

## 2019-09-13 DIAGNOSIS — R202 Paresthesia of skin: Secondary | ICD-10-CM

## 2019-09-13 DIAGNOSIS — R200 Anesthesia of skin: Secondary | ICD-10-CM

## 2019-09-13 MED ORDER — DIAZEPAM 5 MG PO TABS
5.0000 mg | ORAL_TABLET | Freq: Once | ORAL | Status: AC
  Administered 2019-09-13: 5 mg via ORAL

## 2019-09-13 MED ORDER — MEPERIDINE HCL 50 MG/ML IJ SOLN
50.0000 mg | Freq: Once | INTRAMUSCULAR | Status: AC
  Administered 2019-09-13: 50 mg via INTRAMUSCULAR

## 2019-09-13 MED ORDER — IOPAMIDOL (ISOVUE-M 300) INJECTION 61%
10.0000 mL | Freq: Once | INTRAMUSCULAR | Status: AC | PRN
  Administered 2019-09-13: 10 mL via INTRATHECAL

## 2019-09-13 MED ORDER — ONDANSETRON HCL 4 MG/2ML IJ SOLN
4.0000 mg | Freq: Once | INTRAMUSCULAR | Status: AC
  Administered 2019-09-13: 4 mg via INTRAMUSCULAR

## 2019-10-05 ENCOUNTER — Other Ambulatory Visit: Payer: Self-pay

## 2019-10-05 ENCOUNTER — Encounter: Payer: Self-pay | Admitting: Specialist

## 2019-10-05 ENCOUNTER — Ambulatory Visit: Payer: Medicare Other | Admitting: Specialist

## 2019-10-05 VITALS — BP 156/81 | HR 51 | Ht 68.75 in | Wt 181.0 lb

## 2019-10-05 DIAGNOSIS — M25551 Pain in right hip: Secondary | ICD-10-CM

## 2019-10-05 DIAGNOSIS — M1611 Unilateral primary osteoarthritis, right hip: Secondary | ICD-10-CM

## 2019-10-05 DIAGNOSIS — M542 Cervicalgia: Secondary | ICD-10-CM

## 2019-10-05 DIAGNOSIS — Z981 Arthrodesis status: Secondary | ICD-10-CM

## 2019-10-05 DIAGNOSIS — R0781 Pleurodynia: Secondary | ICD-10-CM

## 2019-10-10 ENCOUNTER — Ambulatory Visit (INDEPENDENT_AMBULATORY_CARE_PROVIDER_SITE_OTHER): Payer: Medicare Other | Admitting: Specialist

## 2019-10-10 ENCOUNTER — Other Ambulatory Visit: Payer: Self-pay

## 2019-10-10 ENCOUNTER — Encounter: Payer: Self-pay | Admitting: Specialist

## 2019-10-10 VITALS — BP 142/80 | HR 51 | Ht 68.75 in | Wt 181.0 lb

## 2019-10-10 DIAGNOSIS — M4156 Other secondary scoliosis, lumbar region: Secondary | ICD-10-CM

## 2019-10-10 DIAGNOSIS — Z981 Arthrodesis status: Secondary | ICD-10-CM

## 2019-10-10 DIAGNOSIS — M160 Bilateral primary osteoarthritis of hip: Secondary | ICD-10-CM

## 2019-10-10 DIAGNOSIS — M4802 Spinal stenosis, cervical region: Secondary | ICD-10-CM | POA: Diagnosis not present

## 2019-10-10 DIAGNOSIS — R202 Paresthesia of skin: Secondary | ICD-10-CM

## 2019-10-10 DIAGNOSIS — M4807 Spinal stenosis, lumbosacral region: Secondary | ICD-10-CM

## 2019-10-10 DIAGNOSIS — M47812 Spondylosis without myelopathy or radiculopathy, cervical region: Secondary | ICD-10-CM

## 2019-10-10 DIAGNOSIS — M542 Cervicalgia: Secondary | ICD-10-CM

## 2019-10-10 DIAGNOSIS — R200 Anesthesia of skin: Secondary | ICD-10-CM

## 2019-10-10 DIAGNOSIS — M4316 Spondylolisthesis, lumbar region: Secondary | ICD-10-CM

## 2019-10-10 NOTE — Progress Notes (Signed)
Office Visit Note   Patient: Angela Pugh           Date of Birth: 1945/11/25           MRN: [id number] Visit Date: 10/10/2019              Requested by: [name], MD [place],  [place] [address] PCP: [name], MD   Assessment & Plan: Visit Diagnoses:  1. Spinal stenosis of lumbosacral region   2. Spondylolisthesis, lumbar region   3. Spinal stenosis of cervical region   4. Numbness and tingling of left arm and leg   5. History of fusion of cervical spine   6. Bilateral primary osteoarthritis of hip   7. Numbness and tingling of right leg   8. Other secondary scoliosis, lumbar region   9. Cervicalgia     Plan: Plan: Avoid bending, stooping and avoid lifting weights greater than 10 lbs. Avoid prolong standing and walking. Avoid frequent bending and stooping  No lifting greater than 10 lbs. May use ice or moist heat for pain. Weight loss is of benefit. Handicap license is approved. Avoid overhead lifting and overhead use of the arms. Do not lift greater than 5 lbs. Adjust head rest in vehicle to prevent hyperextension if rear ended. Take extra precautions to avoid falling, including use of a cane if you feel weak. Schedule a referral to Dr. Cyndy Freeze, neurosurgery for eval and treatment of cervical stenosis C2-3 above previous fusion C3-C7 and T1-2 by Drs. Cabell and Dr. Carloyn Manner.  Follow-Up Instructions: No follow-ups on file.   Orders:  Orders Placed This Encounter  Procedures  . Ambulatory referral to Neurosurgery   No orders of the defined types were placed in this encounter.     Procedures: No procedures performed   Clinical Data: No additional findings.   Subjective: Chief Complaint  Patient presents with  . Spine - Follow-up    Review of a total CT myelogram of spine    74 year old female, right handed with numbness into both hands that awakened her from sleep about one week ago. She has been Having hip pain and  difficulty with prolong standing and walking. She underwent myelogram and post myelogram CT scan and  This returns with C3-C7 fusion that is solid, grade 1 anterolisthesis C7-T1 fusion T1-2 posteriorly. She also has severe stenosis with associated Spondylolisthesis L4-5, moderate at L3-4 and mild L2-3, right L5 severe foramenal stenosis. She had an intraarticular hip injection and she  Report the pain in the right hip is better now post steroid injection. No bowel or bladder difficulty. No falls. No difficulty buttoning buttons or increase  Clumsiness. She does have SOB with walking.    Review of Systems  Constitutional: Positive for activity change and unexpected weight change. Negative for appetite change, chills, diaphoresis, fatigue and fever.  HENT: Positive for congestion, sinus pressure, sinus pain and sneezing. Negative for dental problem, drooling, ear discharge, ear pain, facial swelling, hearing loss, mouth sores, nosebleeds, postnasal drip, sore throat, tinnitus, trouble swallowing and voice change.   Eyes: Positive for visual disturbance (no change this year).  Respiratory: Positive for shortness of breath. Negative for apnea, cough, choking, chest tightness, wheezing and stridor.   Cardiovascular: Negative.  Negative for chest pain, palpitations and leg swelling.  Gastrointestinal: Negative.  Negative for abdominal distention, abdominal pain, anal bleeding, blood in stool, constipation, diarrhea, nausea and rectal pain.  Endocrine: Negative for cold intolerance, heat  intolerance, polydipsia, polyphagia and polyuria.  Genitourinary: Negative.  Negative for difficulty urinating, dyspareunia, dysuria, enuresis, flank pain, frequency, genital sores, hematuria and urgency.  Musculoskeletal: Positive for back pain and gait problem. Negative for arthralgias, joint swelling, myalgias, neck pain and neck stiffness.  Allergic/Immunologic: Negative.  Negative for environmental allergies, food  allergies and immunocompromised state.  Neurological: Positive for weakness and numbness. Negative for dizziness, tremors, seizures, facial asymmetry, speech difficulty, light-headedness and headaches.  Hematological: Negative.  Negative for adenopathy. Does not bruise/bleed easily.  Psychiatric/Behavioral: Negative for agitation, behavioral problems, confusion, decreased concentration, dysphoric mood, hallucinations, self-injury, sleep disturbance and suicidal ideas. The patient is not nervous/anxious and is not hyperactive.      Objective: Vital Signs: BP (!) 142/80 (BP Location: Left Arm, Patient Position: Sitting)   Pulse (!) 51   Ht 5' 8.75" (1.746 m)   Wt 181 lb (82.1 kg)   BMI 26.92 kg/m   Physical Exam Constitutional:      Appearance: She is well-developed.  HENT:     Head: Normocephalic and atraumatic.  Eyes:     Pupils: Pupils are equal, round, and reactive to light.  Pulmonary:     Effort: Pulmonary effort is normal.     Breath sounds: Normal breath sounds.  Abdominal:     General: Bowel sounds are normal.     Palpations: Abdomen is soft.  Musculoskeletal:     Cervical back: Normal range of motion and neck supple.     Lumbar back: Negative right straight leg raise test and negative left straight leg raise test.  Skin:    General: Skin is warm and dry.  Neurological:     Mental Status: She is alert and oriented to person, place, and time.  Psychiatric:        Behavior: Behavior normal.        Thought Content: Thought content normal.        Judgment: Judgment normal.     Back Exam   Tenderness  The patient is experiencing tenderness in the cervical and lumbar.  Range of Motion  Extension: abnormal  Flexion: abnormal  Lateral bend right: abnormal  Lateral bend left: abnormal  Rotation right: abnormal  Rotation left: abnormal   Muscle Strength  Right Quadriceps:  5/5  Left Quadriceps:  5/5  Right Hamstrings:  5/5  Left Hamstrings:  5/5   Tests    Straight leg raise right: negative Straight leg raise left: negative  Reflexes  Patellar: 3/4 Achilles: 3/4 Biceps: 3/4 Babinski's sign: normal   Other  Toe walk: normal Heel walk: normal Sensation: normal Gait: normal  Erythema: no back redness Scars: absent  Comments:  Hoffman's sign is positive.      Specialty Comments:  No specialty comments available.  Imaging: No results found.   PMFS History: Patient Active Problem List   Diagnosis Date Noted  . Herniated lumbar intervertebral disc 06/30/2013    Priority: High    Class: Acute  . Nonischemic cardiomyopathy ([facility]) 04/12/2018  . Chest pain 01/25/2018  . Left bundle branch block 01/25/2018  . Carotid artery disease ([facility]) 01/25/2018  . Seasonal allergies 10/18/2017  . Arthritis 10/18/2017  . Pre-diabetes 10/18/2017  . Protein-calorie malnutrition, severe 05/05/2016  . Dysphagia 05/04/2016  . HNP (herniated nucleus pulposus) with myelopathy, cervical 04/17/2016  . Abdominal pain 02/01/2016  . Acute diverticulitis 02/01/2016  . HLD (hyperlipidemia) 02/01/2016  . Diabetes mellitus without complication ([facility]) XX123456  . Hypokalemia 02/01/2016  . Hypertension   . Essential  hypertension   . Cervical spondylosis without myelopathy 01/13/2016  . Bilateral carpal tunnel syndrome 01/13/2016   Past Medical History:  Diagnosis Date  . Arthritis    back, knees, shoulder, hands , injections in pelvis, for bone spurs (05/04/2016)  . Chronic back pain   . Diverticulosis   . History of kidney stones   . HLD (hyperlipidemia)   . Hypertension   . Migraine    hx (05/04/2016)  . Peripheral neuropathy   . Radiculopathy   . Sinus complaint   . Sinus headache    occasional (05/04/2016)  . Type II diabetes mellitus (HCC)    diet controlled, no meds now, was on insulin & po treatment at one time    . Vertigo     Family History  Problem Relation Age of Onset  . Uterine cancer Mother   . Diabetes type II Sister    . Diabetes Mellitus II Brother     Past Surgical History:  Procedure Laterality Date  . ANTERIOR CERVICAL DECOMP/DISCECTOMY FUSION N/A 04/17/2016   Procedure: ANTERIOR CERVICAL DECOMPRESSION/DISCECTOMY FUSION CERVICAL THREE - CERVICAL FOUR, POSTERIOR CERVICAL DISCECTOMY CERVICAL SEVEN -THORASIC ONE LEFT;  Surgeon: Ashok Pall, MD;  Location: [facility];  Service: Neurosurgery;  Laterality: N/A;  Anterior/Posterior  . ANTERIOR FUSION CERVICAL SPINE  2000   Dr. Carloyn Manner; "for bone spurs; put hardware in too"  . BACK SURGERY    . LEFT HEART CATH AND CORONARY ANGIOGRAPHY N/A 03/17/2018   Procedure: LEFT HEART CATH AND CORONARY ANGIOGRAPHY;  Surgeon: Lorretta Harp, MD;  Location: [facility];  Service: Cardiovascular;  Laterality: N/A;  . LUMBAR LAMINECTOMY Left 06/30/2013   Procedure: Left L3-4 Extraforaminal approach to excise far lateral herniated nucleus pulposus;  Surgeon: Jessy Oto, MD;  Location: [facility];  Service: Orthopedics;  Laterality: Left;  . TUBAL LIGATION    . VAGINAL HYSTERECTOMY     Social History   Occupational History  . Not on file  Tobacco Use  . Smoking status: Former Smoker    Packs/day: 0.50    Years: 20.00    Pack years: 10.00    Types: Cigarettes    Quit date: 06/30/1982    Years since quitting: 37.3  . Smokeless tobacco: Never Used  Substance and Sexual Activity  . Alcohol use: Yes    Comment: 05/04/2016 "might have a drink a couple days/year; might not"  . Drug use: No  . Sexual activity: Not Currently

## 2019-10-10 NOTE — Patient Instructions (Addendum)
  Plan: Avoid bending, stooping and avoid lifting weights greater than 10 lbs. Avoid prolong standing and walking. Avoid frequent bending and stooping  No lifting greater than 10 lbs. May use ice or moist heat for pain. Weight loss is of benefit. Handicap license is approved. Avoid overhead lifting and overhead use of the arms. Do not lift greater than 5 lbs. Adjust head rest in vehicle to prevent hyperextension if rear ended. Take extra precautions to avoid falling, including use of a cane if you feel weak. Schedule a referral to Dr. Cyndy Freeze, neurosurgery for eval and treatment of cervical stenosis C2-3 above previous fusion C3-C7 and T1-2 by Drs. Cabell and Dr. Carloyn Manner.

## 2019-10-23 DIAGNOSIS — I10 Essential (primary) hypertension: Secondary | ICD-10-CM | POA: Diagnosis not present

## 2019-10-23 DIAGNOSIS — Z6826 Body mass index (BMI) 26.0-26.9, adult: Secondary | ICD-10-CM | POA: Insufficient documentation

## 2019-10-23 DIAGNOSIS — M545 Low back pain: Secondary | ICD-10-CM | POA: Diagnosis not present

## 2019-10-30 DIAGNOSIS — M545 Low back pain, unspecified: Secondary | ICD-10-CM | POA: Insufficient documentation

## 2019-11-02 ENCOUNTER — Other Ambulatory Visit: Payer: Self-pay | Admitting: Specialist

## 2019-11-22 ENCOUNTER — Ambulatory Visit: Payer: Medicare Other | Admitting: Podiatry

## 2019-11-22 ENCOUNTER — Other Ambulatory Visit: Payer: Self-pay

## 2019-11-22 ENCOUNTER — Encounter: Payer: Self-pay | Admitting: Podiatry

## 2019-11-22 DIAGNOSIS — E1151 Type 2 diabetes mellitus with diabetic peripheral angiopathy without gangrene: Secondary | ICD-10-CM | POA: Diagnosis not present

## 2019-11-22 DIAGNOSIS — M79675 Pain in left toe(s): Secondary | ICD-10-CM | POA: Diagnosis not present

## 2019-11-22 DIAGNOSIS — E119 Type 2 diabetes mellitus without complications: Secondary | ICD-10-CM | POA: Diagnosis not present

## 2019-11-22 DIAGNOSIS — Q828 Other specified congenital malformations of skin: Secondary | ICD-10-CM

## 2019-11-22 DIAGNOSIS — B351 Tinea unguium: Secondary | ICD-10-CM

## 2019-11-22 DIAGNOSIS — M2012 Hallux valgus (acquired), left foot: Secondary | ICD-10-CM | POA: Diagnosis not present

## 2019-11-22 DIAGNOSIS — M79674 Pain in right toe(s): Secondary | ICD-10-CM

## 2019-11-22 DIAGNOSIS — L84 Corns and callosities: Secondary | ICD-10-CM | POA: Diagnosis not present

## 2019-11-22 NOTE — Progress Notes (Signed)
Subjective:  Patient ID: Angela Pugh, female    DOB: [birth year],  MRN: [id number]  74 y.o. female presents with preventative diabetic foot care and painful porokeratotic lesion(s) b/l feet and painful mycotic toenails that limit ambulation. Aggravating factors include weightbearing with and without shoe gear. Pain for both is relieved with periodic professional debridement.   Patient is inquiring about her diabetic shoe status.  Review of Systems: Negative except as noted in the HPI. Denies N/V/F/Ch. Past Medical History:  Diagnosis Date  . Arthritis    back, knees, shoulder, hands , injections in pelvis, for bone spurs (05/04/2016)  . Chronic back pain   . Diverticulosis   . History of kidney stones   . HLD (hyperlipidemia)   . Hypertension   . Migraine    hx (05/04/2016)  . Peripheral neuropathy   . Radiculopathy   . Sinus complaint   . Sinus headache    occasional (05/04/2016)  . Type II diabetes mellitus (HCC)    diet controlled, no meds now, was on insulin & po treatment at one time    . Vertigo    Past Surgical History:  Procedure Laterality Date  . ANTERIOR CERVICAL DECOMP/DISCECTOMY FUSION N/A 04/17/2016   Procedure: ANTERIOR CERVICAL DECOMPRESSION/DISCECTOMY FUSION CERVICAL THREE - CERVICAL FOUR, POSTERIOR CERVICAL DISCECTOMY CERVICAL SEVEN -THORASIC ONE LEFT;  Surgeon: Ashok Pall, MD;  Location: [facility];  Service: Neurosurgery;  Laterality: N/A;  Anterior/Posterior  . ANTERIOR FUSION CERVICAL SPINE  2000   Dr. Carloyn Manner; "for bone spurs; put hardware in too"  . BACK SURGERY    . LEFT HEART CATH AND CORONARY ANGIOGRAPHY N/A 03/17/2018   Procedure: LEFT HEART CATH AND CORONARY ANGIOGRAPHY;  Surgeon: Lorretta Harp, MD;  Location: [facility];  Service: Cardiovascular;  Laterality: N/A;  . LUMBAR LAMINECTOMY Left 06/30/2013   Procedure: Left L3-4 Extraforaminal approach to excise far lateral herniated nucleus pulposus;  Surgeon: Jessy Oto, MD;  Location: [facility];  Service: Orthopedics;  Laterality: Left;  . TUBAL LIGATION    . VAGINAL HYSTERECTOMY     Patient Active Problem List   Diagnosis Date Noted  . Nonischemic cardiomyopathy ([place]) 04/12/2018  . Chest pain 01/25/2018  . Left bundle branch block 01/25/2018  . Carotid artery disease ([place]) 01/25/2018  . Seasonal allergies 10/18/2017  . Arthritis 10/18/2017  . Pre-diabetes 10/18/2017  . Protein-calorie malnutrition, severe 05/05/2016  . Dysphagia 05/04/2016  . HNP (herniated nucleus pulposus) with myelopathy, cervical 04/17/2016  . Abdominal pain 02/01/2016  . Acute diverticulitis 02/01/2016  . HLD (hyperlipidemia) 02/01/2016  . Diabetes mellitus without complication ([place]) 78/67/6720  . Hypokalemia 02/01/2016  . Hypertension   . Essential hypertension   . Cervical spondylosis without myelopathy 01/13/2016  . Bilateral carpal tunnel syndrome 01/13/2016  . Herniated lumbar intervertebral disc 06/30/2013    Class: Acute    Current Outpatient Medications:  .  amLODipine (NORVASC) 5 MG tablet, TAKE 1 TABLET(5 MG) BY MOUTH DAILY, Disp: 90 tablet, Rfl: 2 .  aspirin 81 MG EC tablet, Take 81 mg by mouth daily. Swallow whole., Disp: , Rfl:  .  fluticasone (FLONASE) 50 MCG/ACT nasal spray, Place 1 spray into both nostrils daily as needed for allergies or rhinitis., Disp: , Rfl:  .  gabapentin (NEURONTIN) 100 MG capsule, TAKE 1 CAPSULE(100 MG) BY MOUTH AT BEDTIME, Disp: 30 capsule, Rfl: 3 .  loratadine (CLARITIN) 10 MG tablet, Take 1 tablet (10 mg total) by mouth daily. (Patient not taking: Reported on 09/06/2019), Disp:  30 tablet, Rfl: 11 .  meloxicam (MOBIC) 15 MG tablet, Take 0.5 tablets (7.5 mg total) by mouth daily., Disp: 45 tablet, Rfl: 0 .  Multiple Vitamins-Minerals (CENTRUM ADULTS PO), Take 1 tablet by mouth daily. , Disp: , Rfl:  .  olmesartan (BENICAR) 5 MG tablet, TAKE 2 TABLETS BY MOUTH DAILY, Disp: 180 tablet, Rfl: 1 .  Omega-3 1000 MG CAPS, Take 1,000 mg by mouth daily. , Disp: ,  Rfl:  .  ondansetron (ZOFRAN ODT) 4 MG disintegrating tablet, Take 1 tablet (4 mg total) by mouth every 8 (eight) hours as needed for nausea or vomiting., Disp: 20 tablet, Rfl: 0 .  potassium chloride SA (KLOR-CON) 20 MEQ tablet, TK 2 TS PO ONCE D FOR 1 DOSE., Disp: , Rfl:  .  triamcinolone cream (KENALOG) 0.5 %, Apply 3 application topically 2 (two) times daily as needed (dry skin)., Disp: 30 g, Rfl: 3 Allergies  Allergen Reactions  . Acetaminophen Hives and Nausea Only  . Atorvastatin Nausea Only    weakness  . Azithromycin Hives and Nausea And Vomiting  . Zyrtec [Cetirizine]     Hives    Social History   Tobacco Use  Smoking Status Former Smoker  . Packs/day: 0.50  . Years: 20.00  . Pack years: 10.00  . Types: Cigarettes  . Quit date: 06/30/1982  . Years since quitting: 37.4  Smokeless Tobacco Never Used   Objective:  There were no vitals filed for this visit. Constitutional Patient is a pleasant 74 y.o. African American female, WD, WN in NAD.Marland Kitchen  Vascular Neurovascular status unchanged b/l lower extremities. Capillary refill time to digits immediate b/l. Palpable DP pulse(s) b/l lower extremities Nonpalpable PT pulse(s) b/l lower extremities. Pedal hair sparse. Lower extremity skin temperature gradient within normal limits. No pain with calf compression b/l. No edema noted b/l lower extremities. Capillary refill normal to all digits.  No cyanosis or clubbing noted.  Neurologic Normal speech. Oriented to person, place, and time. Protective sensation intact 5/5 intact bilaterally with 10g monofilament b/l. Vibratory sensation intact b/l. Proprioception intact bilaterally. Clonus negative b/l.  Dermatologic Pedal skin with normal turgor, texture and tone bilaterally. No open wounds bilaterally. No interdigital macerations bilaterally. Toenails 1-5 b/l elongated, discolored, dystrophic, thickened, crumbly with subungual debris and tenderness to dorsal palpation. Porokeratotic  lesion(s) L hallux, R hallux, submet head 2 left foot, submet head 2 right foot. No erythema, no edema, no drainage, no flocculence.  Orthopedic: Normal muscle strength 5/5 to all lower extremity muscle groups bilaterally. No pain crepitus or joint limitation noted with ROM b/l. Hammertoes noted to the L 5th toe and R 5th toe. Diabetic shoes were dispensed and fit appropriately. No heel slippage, no toe pain distallly, dorsally, medially or laterally. Inserts were not offloaded for submet head 2 b/l lesions.    Radiographs: None Assessment:   1. Pain due to onychomycosis of toenails of both feet   2. Porokeratosis   3. Type II diabetes mellitus with peripheral circulatory disorder [facility])    Plan:  Patient was evaluated and treated and all questions answered.  Onychomycosis with pain -Nails palliatively debridement as below. -Educated on self-care  Procedure: Nail Debridement Rationale: Pain Type of Debridement: manual, sharp debridement. Instrumentation: Nail nipper, rotary burr. Number of Nails: 10  -Examined patient. -No new findings. No new orders. -Continue diabetic foot care principles. -Toenails 1-5 b/l were debrided in length and girth with sterile nail nippers and dremel without iatrogenic bleeding.  -Painful porokeratotic lesion(s) submet head  2 left foot, submet head 2 right foot, b/l hallux pared and enucleated with sterile scalpel blade. Light bleeding addressed with Lumicain hemostatic solution to left hallux and submet head 2 right foot. Antibiotic ointment and Salinocaine applied to central core of submet head 2 lesions b/l. She is to remove in 6 hours. Apply antibiotic ointment to lesions once daily for one week. -Her diabetic shoes were here and they were dispensed on today's visit. We will schedule her for offloading of insoles for her submet head 2 lesions b/l.  Return in about 3 months (around 02/22/2020) for diabetic nail and callus trim.  Marzetta Board, DPM

## 2019-11-22 NOTE — Patient Instructions (Addendum)
Wash salinocaine off callus in six hours    Diabetes Mellitus and Foot Care Foot care is an important part of your health, especially when you have diabetes. Diabetes may cause you to have problems because of poor blood flow (circulation) to your feet and legs, which can cause your skin to:  Become thinner and drier.  Break more easily.  Heal more slowly.  Peel and crack. You may also have nerve damage (neuropathy) in your legs and feet, causing decreased feeling in them. This means that you may not notice minor injuries to your feet that could lead to more serious problems. Noticing and addressing any potential problems early is the best way to prevent future foot problems. How to care for your feet Foot hygiene  Wash your feet daily with warm water and mild soap. Do not use hot water. Then, pat your feet and the areas between your toes until they are completely dry. Do not soak your feet as this can dry your skin.  Trim your toenails straight across. Do not dig under them or around the cuticle. File the edges of your nails with an emery board or nail file.  Apply a moisturizing lotion or petroleum jelly to the skin on your feet and to dry, brittle toenails. Use lotion that does not contain alcohol and is unscented. Do not apply lotion between your toes. Shoes and socks  Wear clean socks or stockings every day. Make sure they are not too tight. Do not wear knee-high stockings since they may decrease blood flow to your legs.  Wear shoes that fit properly and have enough cushioning. Always look in your shoes before you put them on to be sure there are no objects inside.  To break in new shoes, wear them for just a few hours a day. This prevents injuries on your feet. Wounds, scrapes, corns, and calluses  Check your feet daily for blisters, cuts, bruises, sores, and redness. If you cannot see the bottom of your feet, use a mirror or ask someone for help.  Do not cut corns or  calluses or try to remove them with medicine.  If you find a minor scrape, cut, or break in the skin on your feet, keep it and the skin around it clean and dry. You may clean these areas with mild soap and water. Do not clean the area with peroxide, alcohol, or iodine.  If you have a wound, scrape, corn, or callus on your foot, look at it several times a day to make sure it is healing and not infected. Check for: ? Redness, swelling, or pain. ? Fluid or blood. ? Warmth. ? Pus or a bad smell. General instructions  Do not cross your legs. This may decrease blood flow to your feet.  Do not use heating pads or hot water bottles on your feet. They may burn your skin. If you have lost feeling in your feet or legs, you may not know this is happening until it is too late.  Protect your feet from hot and cold by wearing shoes, such as at the beach or on hot pavement.  Schedule a complete foot exam at least once a year (annually) or more often if you have foot problems. If you have foot problems, report any cuts, sores, or bruises to your health care provider immediately. Contact a health care provider if:  You have a medical condition that increases your risk of infection and you have any cuts, sores, or bruises  on your feet.  You have an injury that is not healing.  You have redness on your legs or feet.  You feel burning or tingling in your legs or feet.  You have pain or cramps in your legs and feet.  Your legs or feet are numb.  Your feet always feel cold.  You have pain around a toenail. Get help right away if:  You have a wound, scrape, corn, or callus on your foot and: ? You have pain, swelling, or redness that gets worse. ? You have fluid or blood coming from the wound, scrape, corn, or callus. ? Your wound, scrape, corn, or callus feels warm to the touch. ? You have pus or a bad smell coming from the wound, scrape, corn, or callus. ? You have a fever. ? You have a red line  going up your leg. Summary  Check your feet every day for cuts, sores, red spots, swelling, and blisters.  Moisturize feet and legs daily.  Wear shoes that fit properly and have enough cushioning.  If you have foot problems, report any cuts, sores, or bruises to your health care provider immediately.  Schedule a complete foot exam at least once a year (annually) or more often if you have foot problems. This information is not intended to replace advice given to you by your health care provider. Make sure you discuss any questions you have with your health care provider. Document Revised: 01/25/2019 Document Reviewed: 06/05/2016 Elsevier Patient Education  [place].

## 2019-11-23 ENCOUNTER — Telehealth: Payer: Self-pay | Admitting: Podiatry

## 2019-11-23 NOTE — Telephone Encounter (Signed)
Pt called and stated she got a paper about [employer] and wanted to know what she wants to do with paper bring back

## 2019-11-23 NOTE — Telephone Encounter (Signed)
We gave her that paper just so she would have the phone number for [facility]. She doesn't need to do anything with the paper. It was for her information  Thanks

## 2019-11-29 ENCOUNTER — Encounter: Payer: Self-pay | Admitting: Surgery

## 2019-11-29 ENCOUNTER — Other Ambulatory Visit: Payer: Self-pay | Admitting: Surgery

## 2019-11-29 ENCOUNTER — Ambulatory Visit: Payer: Medicare Other | Admitting: Surgery

## 2019-11-29 ENCOUNTER — Telehealth: Payer: Self-pay | Admitting: Surgery

## 2019-11-29 VITALS — BP 136/77 | HR 48 | Ht 68.75 in | Wt 181.0 lb

## 2019-11-29 DIAGNOSIS — M1712 Unilateral primary osteoarthritis, left knee: Secondary | ICD-10-CM

## 2019-11-29 NOTE — Progress Notes (Signed)
Office Visit Note   Patient: Angela Pugh           Date of Birth: 01-03-1946           MRN: [id number] Visit Date: 11/29/2019              Requested by: [name], MD [place],  [place] [id number] PCP: [name], MD   Assessment & Plan: Visit Diagnoses:  1. Unilateral primary osteoarthritis, left knee     Plan: Advised patient that definitive treatment for her knee would be total knee replacement.  She would like to continue conservative management.  She will return in 3 weeks to start viscosupplementation.  Follow-Up Instructions: Return for 3 weeks to start visco injections with Arelis Neumeier. .   Orders:  No orders of the defined types were placed in this encounter.  No orders of the defined types were placed in this encounter.     Procedures: No procedures performed   Clinical Data: No additional findings.   Subjective: Chief Complaint  Patient presents with  . Left Knee - Pain    HPI 74 year old female returns with complaints of left knee pain.  Patient has known history of end-stage DJD.  Has previously received intra-articular Marcaine/Depo-Medrol injection that helped for a couple of months.  States that knee pain is getting worse since last office visit in February.  She has been taking oral NSAIDs without any improvement.  Pain is affecting her quality of life. Review of Systems No current cardiac pulmonary GI GU issues  Objective: Vital Signs: BP 136/77   Pulse (!) 48   Ht 5' 8.75" (1.746 m)   Wt 181 lb (82.1 kg)   BMI 26.92 kg/m   Physical Exam HENT:     Head: Normocephalic and atraumatic.  Eyes:     Extraocular Movements: Extraocular movements intact.     Pupils: Pupils are equal, round, and reactive to light.  Pulmonary:     Effort: No respiratory distress.  Musculoskeletal:     Comments: Gait is antalgic.  Knee has some swelling without large effusion.  Positive crepitus.  Joint line tender.  Neurological:      General: No focal deficit present.     Mental Status: She is alert and oriented to person, place, and time.  Psychiatric:        Mood and Affect: Mood normal.     Ortho Exam  Specialty Comments:  No specialty comments available.  Imaging: No results found.   PMFS History: Patient Active Problem List   Diagnosis Date Noted  . Nonischemic cardiomyopathy ([place]) 04/12/2018  . Chest pain 01/25/2018  . Left bundle branch block 01/25/2018  . Carotid artery disease ([place]) 01/25/2018  . Seasonal allergies 10/18/2017  . Arthritis 10/18/2017  . Pre-diabetes 10/18/2017  . Protein-calorie malnutrition, severe 05/05/2016  . Dysphagia 05/04/2016  . HNP (herniated nucleus pulposus) with myelopathy, cervical 04/17/2016  . Abdominal pain 02/01/2016  . Acute diverticulitis 02/01/2016  . HLD (hyperlipidemia) 02/01/2016  . Diabetes mellitus without complication ([place]) 38/75/6433  . Hypokalemia 02/01/2016  . Hypertension   . Essential hypertension   . Cervical spondylosis without myelopathy 01/13/2016  . Bilateral carpal tunnel syndrome 01/13/2016  . Herniated lumbar intervertebral disc 06/30/2013    Class: Acute   Past Medical History:  Diagnosis Date  . Arthritis    back, knees, shoulder, hands , injections in pelvis, for bone spurs (05/04/2016)  . Chronic back pain   . Diverticulosis   .  History of kidney stones   . HLD (hyperlipidemia)   . Hypertension   . Migraine    hx (05/04/2016)  . Peripheral neuropathy   . Radiculopathy   . Sinus complaint   . Sinus headache    occasional (05/04/2016)  . Type II diabetes mellitus (HCC)    diet controlled, no meds now, was on insulin & po treatment at one time    . Vertigo     Family History  Problem Relation Age of Onset  . Uterine cancer Mother   . Diabetes type II Sister   . Diabetes Mellitus II Brother     Past Surgical History:  Procedure Laterality Date  . ANTERIOR CERVICAL DECOMP/DISCECTOMY FUSION N/A 04/17/2016    Procedure: ANTERIOR CERVICAL DECOMPRESSION/DISCECTOMY FUSION CERVICAL THREE - CERVICAL FOUR, POSTERIOR CERVICAL DISCECTOMY CERVICAL SEVEN -THORASIC ONE LEFT;  Surgeon: Ashok Pall, MD;  Location: [facility];  Service: Neurosurgery;  Laterality: N/A;  Anterior/Posterior  . ANTERIOR FUSION CERVICAL SPINE  2000   Dr. Carloyn Manner; "for bone spurs; put hardware in too"  . BACK SURGERY    . LEFT HEART CATH AND CORONARY ANGIOGRAPHY N/A 03/17/2018   Procedure: LEFT HEART CATH AND CORONARY ANGIOGRAPHY;  Surgeon: Lorretta Harp, MD;  Location: [facility];  Service: Cardiovascular;  Laterality: N/A;  . LUMBAR LAMINECTOMY Left 06/30/2013   Procedure: Left L3-4 Extraforaminal approach to excise far lateral herniated nucleus pulposus;  Surgeon: Jessy Oto, MD;  Location: [facility];  Service: Orthopedics;  Laterality: Left;  . TUBAL LIGATION    . VAGINAL HYSTERECTOMY     Social History   Occupational History  . Not on file  Tobacco Use  . Smoking status: Former Smoker    Packs/day: 0.50    Years: 20.00    Pack years: 10.00    Types: Cigarettes    Quit date: 06/30/1982    Years since quitting: 37.4  . Smokeless tobacco: Never Used  Vaping Use  . Vaping Use: Never used  Substance and Sexual Activity  . Alcohol use: Yes    Comment: 05/04/2016 "might have a drink a couple days/year; might not"  . Drug use: No  . Sexual activity: Not Currently

## 2019-11-29 NOTE — Telephone Encounter (Signed)
Apply for orthovisc series.  Schedule rov with me to start series.

## 2019-11-29 NOTE — Telephone Encounter (Signed)
Noted  

## 2019-12-01 ENCOUNTER — Telehealth: Payer: Self-pay

## 2019-12-01 NOTE — Telephone Encounter (Signed)
Submitted VOB, Synvisc series, left knee.

## 2019-12-11 ENCOUNTER — Telehealth: Payer: Self-pay

## 2019-12-11 NOTE — Telephone Encounter (Signed)
Called and left VM for patient to call back to make an appointment with Benjiman Core for gel injection.  Approved, Synvisc series, left knee. [place] Patient will be responsible for 20% OOP. Co-pay of $35.00 required No PA required

## 2019-12-14 ENCOUNTER — Encounter: Payer: Self-pay | Admitting: Surgery

## 2019-12-14 ENCOUNTER — Ambulatory Visit: Payer: Medicare Other | Admitting: Surgery

## 2019-12-14 VITALS — BP 112/69 | HR 57 | Ht 68.75 in | Wt 181.0 lb

## 2019-12-14 DIAGNOSIS — M1712 Unilateral primary osteoarthritis, left knee: Secondary | ICD-10-CM | POA: Diagnosis not present

## 2019-12-14 MED ORDER — LIDOCAINE HCL 1 % IJ SOLN
3.0000 mL | INTRAMUSCULAR | Status: AC | PRN
  Administered 2019-12-14: 3 mL

## 2019-12-14 MED ORDER — HYLAN G-F 20 16 MG/2ML IX SOSY
16.0000 mg | PREFILLED_SYRINGE | INTRA_ARTICULAR | Status: AC | PRN
  Administered 2019-12-14: 16 mg via INTRA_ARTICULAR

## 2019-12-14 NOTE — Progress Notes (Signed)
Office Visit Note   Patient: Angela Pugh           Date of Birth: 1945-06-25           MRN: [id number] Visit Date: 12/14/2019              Requested by: [name], MD [place],  [place] [address] PCP: [name], MD   Assessment & Plan: Visit Diagnoses:  1. Unilateral primary osteoarthritis, left knee     Plan: Today I started Synvisc series.  After patient sent left knee was prepped with Betadine and Synvisc injection #1 of 2 was performed.  Tolerated without complication.  Patient will follow up with me in 1 week for injection #2.  Follow-Up Instructions: Return in about 1 week (around 12/21/2019) for With Jeneen Rinks for Synvisc injection #2 left knee.   Orders:  No orders of the defined types were placed in this encounter.  No orders of the defined types were placed in this encounter.     Procedures: Large Joint Inj: L knee on 12/14/2019 9:22 AM Indications: pain Details: 25 G 1.5 in needle, anteromedial approach Medications: 3 mL lidocaine 1 %; 16 mg Hylan 16 MG/2ML Outcome: tolerated well, no immediate complications Consent was given by the patient. Patient was prepped and draped in the usual sterile fashion.       Clinical Data: No additional findings.   Subjective: Chief Complaint  Patient presents with  . Left Knee - Pain    HPI 74 year old black female with history of left knee DJD comes in to start Synvisc series.  Continues have ongoing pain in her knee.   Objective: Vital Signs: BP 112/69   Pulse 57   Ht 5' 8.75" (1.746 m)   Wt 181 lb (82.1 kg)   BMI 26.92 kg/m   Physical Exam Gait antalgic.  Left knee joint line tender. Ortho Exam  Specialty Comments:  No specialty comments available.  Imaging: No results found.   PMFS History: Patient Active Problem List   Diagnosis Date Noted  . Nonischemic cardiomyopathy ([place]) 04/12/2018  . Chest pain 01/25/2018  . Left bundle branch block 01/25/2018  .  Carotid artery disease ([place]) 01/25/2018  . Seasonal allergies 10/18/2017  . Arthritis 10/18/2017  . Pre-diabetes 10/18/2017  . Protein-calorie malnutrition, severe 05/05/2016  . Dysphagia 05/04/2016  . HNP (herniated nucleus pulposus) with myelopathy, cervical 04/17/2016  . Abdominal pain 02/01/2016  . Acute diverticulitis 02/01/2016  . HLD (hyperlipidemia) 02/01/2016  . Diabetes mellitus without complication ([place]) 78/67/6720  . Hypokalemia 02/01/2016  . Hypertension   . Essential hypertension   . Cervical spondylosis without myelopathy 01/13/2016  . Bilateral carpal tunnel syndrome 01/13/2016  . Herniated lumbar intervertebral disc 06/30/2013    Class: Acute   Past Medical History:  Diagnosis Date  . Arthritis    back, knees, shoulder, hands , injections in pelvis, for bone spurs (05/04/2016)  . Chronic back pain   . Diverticulosis   . History of kidney stones   . HLD (hyperlipidemia)   . Hypertension   . Migraine    hx (05/04/2016)  . Peripheral neuropathy   . Radiculopathy   . Sinus complaint   . Sinus headache    occasional (05/04/2016)  . Type II diabetes mellitus (HCC)    diet controlled, no meds now, was on insulin & po treatment at one time    . Vertigo     Family History  Problem Relation Age of  Onset  . Uterine cancer Mother   . Diabetes type II Sister   . Diabetes Mellitus II Brother     Past Surgical History:  Procedure Laterality Date  . ANTERIOR CERVICAL DECOMP/DISCECTOMY FUSION N/A 04/17/2016   Procedure: ANTERIOR CERVICAL DECOMPRESSION/DISCECTOMY FUSION CERVICAL THREE - CERVICAL FOUR, POSTERIOR CERVICAL DISCECTOMY CERVICAL SEVEN -THORASIC ONE LEFT;  Surgeon: Ashok Pall, MD;  Location: [facility];  Service: Neurosurgery;  Laterality: N/A;  Anterior/Posterior  . ANTERIOR FUSION CERVICAL SPINE  2000   Dr. Carloyn Manner; "for bone spurs; put hardware in too"  . BACK SURGERY    . LEFT HEART CATH AND CORONARY ANGIOGRAPHY N/A 03/17/2018   Procedure: LEFT HEART CATH AND  CORONARY ANGIOGRAPHY;  Surgeon: Lorretta Harp, MD;  Location: [facility];  Service: Cardiovascular;  Laterality: N/A;  . LUMBAR LAMINECTOMY Left 06/30/2013   Procedure: Left L3-4 Extraforaminal approach to excise far lateral herniated nucleus pulposus;  Surgeon: Jessy Oto, MD;  Location: [facility];  Service: Orthopedics;  Laterality: Left;  . TUBAL LIGATION    . VAGINAL HYSTERECTOMY     Social History   Occupational History  . Not on file  Tobacco Use  . Smoking status: Former Smoker    Packs/day: 0.50    Years: 20.00    Pack years: 10.00    Types: Cigarettes    Quit date: 06/30/1982    Years since quitting: 37.4  . Smokeless tobacco: Never Used  Vaping Use  . Vaping Use: Never used  Substance and Sexual Activity  . Alcohol use: Yes    Comment: 05/04/2016 "might have a drink a couple days/year; might not"  . Drug use: No  . Sexual activity: Not Currently

## 2019-12-15 ENCOUNTER — Other Ambulatory Visit: Payer: Medicare Other | Admitting: Orthotics

## 2019-12-15 ENCOUNTER — Other Ambulatory Visit: Payer: Self-pay

## 2019-12-21 ENCOUNTER — Other Ambulatory Visit: Payer: Self-pay

## 2019-12-21 ENCOUNTER — Encounter: Payer: Self-pay | Admitting: Surgery

## 2019-12-21 ENCOUNTER — Ambulatory Visit: Payer: Medicare Other | Admitting: Surgery

## 2019-12-21 DIAGNOSIS — M1712 Unilateral primary osteoarthritis, left knee: Secondary | ICD-10-CM | POA: Diagnosis not present

## 2019-12-21 MED ORDER — HYLAN G-F 20 16 MG/2ML IX SOSY
16.0000 mg | PREFILLED_SYRINGE | INTRA_ARTICULAR | Status: AC | PRN
  Administered 2019-12-21: 16 mg via INTRA_ARTICULAR

## 2019-12-21 MED ORDER — LIDOCAINE HCL 1 % IJ SOLN
3.0000 mL | INTRAMUSCULAR | Status: AC | PRN
  Administered 2019-12-21: 3 mL

## 2019-12-21 NOTE — Progress Notes (Signed)
Office Visit Note   Patient: Angela Pugh           Date of Birth: [birth year]           MRN: [id number] Visit Date: 12/21/2019              Requested by: [name], MD [place],  [place] [address] PCP: [name], MD   Assessment & Plan: Visit Diagnoses:  1. Unilateral primary osteoarthritis, left knee     Plan: After patient consent knee was prepped with Betadine and intra-articular Synvisc injection #2 of 3 performed.  Tolerated without complication.  Patient will follow up in 1 week for her third and final injection.  Follow-Up Instructions: Return in about 1 week (around 12/28/2019) for synvisc injection #3.   Orders:  No orders of the defined types were placed in this encounter.  No orders of the defined types were placed in this encounter.     Procedures: Large Joint Inj on 12/21/2019 11:11 AM Indications: pain Details: 25 G 1.5 in needle, anteromedial approach Medications: 3 mL lidocaine 1 %; 16 mg Hylan 16 MG/2ML Outcome: tolerated well, no immediate complications      Clinical Data: No additional findings.   Subjective: Chief Complaint  Patient presents with  . Left Knee - Injections    HPI 74 year old black female history of left knee DJD returns for her second Visco injection.  States that she has had some improvement of her knee symptoms.  No adverse reaction.   Objective: Vital Signs: There were no vitals taken for this visit.  Physical Exam Patient tender at the joint line.  Some swelling without large effusion. Ortho Exam  Specialty Comments:  No specialty comments available.  Imaging: No results found.   PMFS History: Patient Active Problem List   Diagnosis Date Noted  . Nonischemic cardiomyopathy ([place]) 04/12/2018  . Chest pain 01/25/2018  . Left bundle branch block 01/25/2018  . Carotid artery disease ([place]) 01/25/2018  . Seasonal allergies 10/18/2017  . Arthritis 10/18/2017  . Pre-diabetes  10/18/2017  . Protein-calorie malnutrition, severe 05/05/2016  . Dysphagia 05/04/2016  . HNP (herniated nucleus pulposus) with myelopathy, cervical 04/17/2016  . Abdominal pain 02/01/2016  . Acute diverticulitis 02/01/2016  . HLD (hyperlipidemia) 02/01/2016  . Diabetes mellitus without complication ([facility]) 62/94/7654  . Hypokalemia 02/01/2016  . Hypertension   . Essential hypertension   . Cervical spondylosis without myelopathy 01/13/2016  . Bilateral carpal tunnel syndrome 01/13/2016  . Herniated lumbar intervertebral disc 06/30/2013    Class: Acute   Past Medical History:  Diagnosis Date  . Arthritis    back, knees, shoulder, hands , injections in pelvis, for bone spurs (05/04/2016)  . Chronic back pain   . Diverticulosis   . History of kidney stones   . HLD (hyperlipidemia)   . Hypertension   . Migraine    hx (05/04/2016)  . Peripheral neuropathy   . Radiculopathy   . Sinus complaint   . Sinus headache    occasional (05/04/2016)  . Type II diabetes mellitus (HCC)    diet controlled, no meds now, was on insulin & po treatment at one time    . Vertigo     Family History  Problem Relation Age of Onset  . Uterine cancer Mother   . Diabetes type II Sister   . Diabetes Mellitus II Brother     Past Surgical History:  Procedure Laterality Date  . ANTERIOR CERVICAL DECOMP/DISCECTOMY FUSION  N/A 04/17/2016   Procedure: ANTERIOR CERVICAL DECOMPRESSION/DISCECTOMY FUSION CERVICAL THREE - CERVICAL FOUR, POSTERIOR CERVICAL DISCECTOMY CERVICAL SEVEN -THORASIC ONE LEFT;  Surgeon: Ashok Pall, MD;  Location: [facility];  Service: Neurosurgery;  Laterality: N/A;  Anterior/Posterior  . ANTERIOR FUSION CERVICAL SPINE  2000   Dr. Carloyn Manner; "for bone spurs; put hardware in too"  . BACK SURGERY    . LEFT HEART CATH AND CORONARY ANGIOGRAPHY N/A 03/17/2018   Procedure: LEFT HEART CATH AND CORONARY ANGIOGRAPHY;  Surgeon: Lorretta Harp, MD;  Location: [facility];  Service: Cardiovascular;   Laterality: N/A;  . LUMBAR LAMINECTOMY Left 06/30/2013   Procedure: Left L3-4 Extraforaminal approach to excise far lateral herniated nucleus pulposus;  Surgeon: Jessy Oto, MD;  Location: [facility];  Service: Orthopedics;  Laterality: Left;  . TUBAL LIGATION    . VAGINAL HYSTERECTOMY     Social History   Occupational History  . Not on file  Tobacco Use  . Smoking status: Former Smoker    Packs/day: 0.50    Years: 20.00    Pack years: 10.00    Types: Cigarettes    Quit date: 06/30/1982    Years since quitting: 37.5  . Smokeless tobacco: Never Used  Vaping Use  . Vaping Use: Never used  Substance and Sexual Activity  . Alcohol use: Yes    Comment: 05/04/2016 "might have a drink a couple days/year; might not"  . Drug use: No  . Sexual activity: Not Currently

## 2019-12-28 ENCOUNTER — Other Ambulatory Visit: Payer: Self-pay

## 2019-12-28 ENCOUNTER — Encounter: Payer: Self-pay | Admitting: Specialist

## 2019-12-28 ENCOUNTER — Ambulatory Visit: Payer: Medicare Other | Admitting: Specialist

## 2019-12-28 VITALS — Ht 68.75 in | Wt 181.0 lb

## 2019-12-28 DIAGNOSIS — M1712 Unilateral primary osteoarthritis, left knee: Secondary | ICD-10-CM

## 2019-12-28 MED ORDER — HYLAN G-F 20 16 MG/2ML IX SOSY
16.0000 mg | PREFILLED_SYRINGE | INTRA_ARTICULAR | Status: AC | PRN
  Administered 2019-12-28: 16 mg via INTRA_ARTICULAR

## 2019-12-28 MED ORDER — GABAPENTIN 100 MG PO CAPS: ORAL_CAPSULE | ORAL | 3 refills | Status: DC

## 2019-12-28 MED ORDER — LIDOCAINE HCL 1 % IJ SOLN
3.0000 mL | INTRAMUSCULAR | Status: AC | PRN
  Administered 2019-12-28: 3 mL

## 2019-12-28 NOTE — Addendum Note (Signed)
Addended by: Basil Dess on: 12/28/2019 10:25 AM   Modules accepted: Orders

## 2019-12-28 NOTE — Progress Notes (Signed)
Office Visit Note   Patient: Angela Pugh           Date of Birth: May 17, 1946           MRN: [id number] Visit Date: 12/28/2019              Requested by: [name], MD [place],  [place] [id number] PCP: [name], MD   Assessment & Plan: Visit Diagnoses:  1. Unilateral primary osteoarthritis, left knee     Plan: Today after patient consent third and final Synvisc injection performed.  Tolerated without complication.  Patient will follow-up in 3 months for recheck to see how she is doing.  If she does not have any improvement she understands that the next course of treatment may be definitive with total knee replacement.  She will return sooner if needed.  Follow-Up Instructions: Return in about 3 months (around 03/29/2020) for recheck left knee after visco injections.   Orders:  No orders of the defined types were placed in this encounter.  No orders of the defined types were placed in this encounter.     Procedures: Large Joint Inj on 12/28/2019 9:19 AM Indications: pain Details: 25 G 1.5 in needle, medial approach Medications: 3 mL lidocaine 1 %; 16 mg Hylan 16 MG/2ML Outcome: tolerated well, no immediate complications Consent was given by the patient. Patient was prepped and draped in the usual sterile fashion.       Clinical Data: No additional findings.   Subjective: Chief Complaint  Patient presents with  . Left Knee - Follow-up    Synvisc #3---    HPI 74 year old female history of left knee DJD returns for her third and final Synvisc injection.  States that she has had some improvement in her knee pain.  She did mention recent episode of spasm in the left thigh when she was sleeping.  This was short-lived and went away.  She had discontinued gabapentin.  Today history of chronic lumbar spine issues with most recent CT September 13, 2019 showing:  CT LUMBAR MYELOGRAM FINDINGS:  L1-2: Minimal noncompressive disc  bulge.  L2-3: Moderate disc bulge. Ligamentous hypertrophy, with degenerative nitrogen gas in the ligament on the right. Mild stenosis of both lateral recesses.  L3-4: Moderate bulging of the disc. Facet and ligamentous hypertrophy. Stenosis of both lateral recesses that could possibly cause neural compression. Bilateral foraminal narrowing as well.  L4-5: Anterolisthesis of 6 mm, known to increase to 1 cm with standing and flexion. Bulging of the disc. Facet and ligamentous hypertrophy. Multifactorial spinal stenosis at this level that would be worse with standing and flexion. Bilateral foraminal encroachment, right worse than left. Considerable potential to compress the right L4 nerve.  L5-S1: Chronic disc degeneration with loss of disc height. Endplate osteophytes but no disc herniation. Bilateral facet osteoarthritis. Mild narrowing of the subarticular lateral recess on the right but no likely neural compression. Foraminal narrowing bilaterally right more than left which could possibly affect the exiting L5 nerves  Objective: Vital Signs: Ht 5' 8.75" (1.746 m)   Wt 181 lb (82.1 kg)   BMI 26.92 kg/m   Physical Exam Gait is somewhat antalgic.  Left knee joint line less tender.  Some swelling without large effusion. Ortho Exam  Specialty Comments:  No specialty comments available.  Imaging: No results found.   PMFS History: Patient Active Problem List   Diagnosis Date Noted  . Nonischemic cardiomyopathy ([place]) 04/12/2018  . Chest pain 01/25/2018  .  Left bundle branch block 01/25/2018  . Carotid artery disease ([place]) 01/25/2018  . Seasonal allergies 10/18/2017  . Arthritis 10/18/2017  . Pre-diabetes 10/18/2017  . Protein-calorie malnutrition, severe 05/05/2016  . Dysphagia 05/04/2016  . HNP (herniated nucleus pulposus) with myelopathy, cervical 04/17/2016  . Abdominal pain 02/01/2016  . Acute diverticulitis 02/01/2016  . HLD (hyperlipidemia) 02/01/2016  .  Diabetes mellitus without complication ([facility]) 23/76/2831  . Hypokalemia 02/01/2016  . Hypertension   . Essential hypertension   . Cervical spondylosis without myelopathy 01/13/2016  . Bilateral carpal tunnel syndrome 01/13/2016  . Herniated lumbar intervertebral disc 06/30/2013    Class: Acute   Past Medical History:  Diagnosis Date  . Arthritis    back, knees, shoulder, hands , injections in pelvis, for bone spurs (05/04/2016)  . Chronic back pain   . Diverticulosis   . History of kidney stones   . HLD (hyperlipidemia)   . Hypertension   . Migraine    hx (05/04/2016)  . Peripheral neuropathy   . Radiculopathy   . Sinus complaint   . Sinus headache    occasional (05/04/2016)  . Type II diabetes mellitus (HCC)    diet controlled, no meds now, was on insulin & po treatment at one time    . Vertigo     Family History  Problem Relation Age of Onset  . Uterine cancer Mother   . Diabetes type II Sister   . Diabetes Mellitus II Brother     Past Surgical History:  Procedure Laterality Date  . ANTERIOR CERVICAL DECOMP/DISCECTOMY FUSION N/A 04/17/2016   Procedure: ANTERIOR CERVICAL DECOMPRESSION/DISCECTOMY FUSION CERVICAL THREE - CERVICAL FOUR, POSTERIOR CERVICAL DISCECTOMY CERVICAL SEVEN -THORASIC ONE LEFT;  Surgeon: Ashok Pall, MD;  Location: [facility];  Service: Neurosurgery;  Laterality: N/A;  Anterior/Posterior  . ANTERIOR FUSION CERVICAL SPINE  2000   Dr. Carloyn Manner; "for bone spurs; put hardware in too"  . BACK SURGERY    . LEFT HEART CATH AND CORONARY ANGIOGRAPHY N/A 03/17/2018   Procedure: LEFT HEART CATH AND CORONARY ANGIOGRAPHY;  Surgeon: Lorretta Harp, MD;  Location: [facility];  Service: Cardiovascular;  Laterality: N/A;  . LUMBAR LAMINECTOMY Left 06/30/2013   Procedure: Left L3-4 Extraforaminal approach to excise far lateral herniated nucleus pulposus;  Surgeon: Jessy Oto, MD;  Location: [facility];  Service: Orthopedics;  Laterality: Left;  . TUBAL LIGATION    .  VAGINAL HYSTERECTOMY     Social History   Occupational History  . Not on file  Tobacco Use  . Smoking status: Former Smoker    Packs/day: 0.50    Years: 20.00    Pack years: 10.00    Types: Cigarettes    Quit date: 06/30/1982    Years since quitting: 37.5  . Smokeless tobacco: Never Used  Vaping Use  . Vaping Use: Never used  Substance and Sexual Activity  . Alcohol use: Yes    Comment: 05/04/2016 "might have a drink a couple days/year; might not"  . Drug use: No  . Sexual activity: Not Currently

## 2020-01-08 ENCOUNTER — Other Ambulatory Visit: Payer: Self-pay | Admitting: Cardiovascular Disease

## 2020-01-08 ENCOUNTER — Telehealth: Payer: Self-pay | Admitting: Family Medicine

## 2020-01-08 ENCOUNTER — Encounter: Payer: Self-pay | Admitting: Family Medicine

## 2020-01-08 DIAGNOSIS — H469 Unspecified optic neuritis: Secondary | ICD-10-CM | POA: Diagnosis not present

## 2020-01-08 DIAGNOSIS — H2513 Age-related nuclear cataract, bilateral: Secondary | ICD-10-CM | POA: Diagnosis not present

## 2020-01-08 DIAGNOSIS — H25013 Cortical age-related cataract, bilateral: Secondary | ICD-10-CM | POA: Diagnosis not present

## 2020-01-08 DIAGNOSIS — H40013 Open angle with borderline findings, low risk, bilateral: Secondary | ICD-10-CM | POA: Diagnosis not present

## 2020-01-08 LAB — HM DIABETES EYE EXAM

## 2020-01-08 NOTE — Telephone Encounter (Signed)
The patient has a growth or cyst growing close to her tear ducts and her eye doctor suggest that she ask her primary care doctor to send her to a dermatologist.  Please advise

## 2020-01-09 NOTE — Telephone Encounter (Signed)
Please advise 

## 2020-01-11 ENCOUNTER — Other Ambulatory Visit: Payer: Self-pay

## 2020-01-11 NOTE — Telephone Encounter (Signed)
Pt is calling in to check the previous call from Monday and stated that she would like to have a call back today.

## 2020-01-11 NOTE — Telephone Encounter (Signed)
Spoke with pt scheduled to see Dr Volanda Napoleon on 01/12/2020 at 10.30 am

## 2020-01-12 ENCOUNTER — Encounter: Payer: Self-pay | Admitting: Family Medicine

## 2020-01-12 ENCOUNTER — Other Ambulatory Visit: Payer: Self-pay

## 2020-01-12 ENCOUNTER — Ambulatory Visit (INDEPENDENT_AMBULATORY_CARE_PROVIDER_SITE_OTHER): Payer: Medicare Other | Admitting: Family Medicine

## 2020-01-12 VITALS — BP 120/70 | HR 63 | Temp 98.1°F | Wt 170.0 lb

## 2020-01-12 DIAGNOSIS — H00014 Hordeolum externum left upper eyelid: Secondary | ICD-10-CM

## 2020-01-12 DIAGNOSIS — L84 Corns and callosities: Secondary | ICD-10-CM

## 2020-01-12 DIAGNOSIS — D519 Vitamin B12 deficiency anemia, unspecified: Secondary | ICD-10-CM | POA: Diagnosis not present

## 2020-01-12 DIAGNOSIS — E559 Vitamin D deficiency, unspecified: Secondary | ICD-10-CM | POA: Diagnosis not present

## 2020-01-12 DIAGNOSIS — R1084 Generalized abdominal pain: Secondary | ICD-10-CM

## 2020-01-12 DIAGNOSIS — Z1211 Encounter for screening for malignant neoplasm of colon: Secondary | ICD-10-CM

## 2020-01-12 DIAGNOSIS — E119 Type 2 diabetes mellitus without complications: Secondary | ICD-10-CM

## 2020-01-12 DIAGNOSIS — R5383 Other fatigue: Secondary | ICD-10-CM | POA: Diagnosis not present

## 2020-01-12 DIAGNOSIS — L720 Epidermal cyst: Secondary | ICD-10-CM | POA: Diagnosis not present

## 2020-01-12 LAB — POCT GLUCOSE (DEVICE FOR HOME USE): Glucose Fasting, POC: 114 mg/dL — AB (ref 70–99)

## 2020-01-12 NOTE — Progress Notes (Signed)
Subjective:    Patient ID: Angela Pugh, female    DOB: 17-Dec-1945, 74 y.o.   MRN: [id number]  No chief complaint on file.   HPI Patient was seen today for acute concerns.  Patient endorses feeling fatigued/sluggish since June.  Pt notes history of snoring at night and needing to take naps during the day.  Pt has never had a sleep study.  States FSBS typically 100-111.  Pt endorses nausea and abdominal pain when hungry.  Also with increased sweating.  Pt recently seen by Dr. Kathlen Mody to monitor eye pressure.  During visit several cyst noted on face in medial corner of right eye, not associated with tear duct.  At times the cyst enlarges and is in her visual field.  Cyst hard, tender, present x 1 month.    Pt seeing podiatrist at [facility] every 3 months for calluses on feet.  States calluses return and painful prior to the next appointment.  Areas ache/throb.  Requesting referral to be seen more frequently.  Pt received a letter about scheduling follow-up colonoscopy.  Per the letter patient had polyps removed 10 years ago, however she was unaware of this.  Patient requesting a referral for new gastroenterologist.  Past Medical History:  Diagnosis Date  . Arthritis    back, knees, shoulder, hands , injections in pelvis, for bone spurs (05/04/2016)  . Chronic back pain   . Diverticulosis   . History of kidney stones   . HLD (hyperlipidemia)   . Hypertension   . Migraine    hx (05/04/2016)  . Peripheral neuropathy   . Radiculopathy   . Sinus complaint   . Sinus headache    occasional (05/04/2016)  . Type II diabetes mellitus (HCC)    diet controlled, no meds now, was on insulin & po treatment at one time    . Vertigo     Allergies  Allergen Reactions  . Acetaminophen Hives and Nausea Only  . Atorvastatin Nausea Only    weakness  . Azithromycin Hives and Nausea And Vomiting  . Zyrtec [Cetirizine]     Hives     ROS General: Denies fever, chills, night sweats,  changes in weight, changes in appetite  +fatigue, snoring, diaphoresis HEENT: Denies headaches, ear pain, changes in vision, rhinorrhea, sore throat CV: Denies CP, palpitations, SOB, orthopnea Pulm: Denies SOB, cough, wheezing GI: Denies abdominal pain, nausea, vomiting, diarrhea, constipation GU: Denies dysuria, hematuria, frequency, vaginal discharge Msk: Denies muscle cramps, joint pains Neuro: Denies weakness, numbness, tingling Skin: Denies rashes, bruising  +calluses on feet, bumps on face Psych: Denies depression, anxiety, hallucinations     Objective:    Blood pressure 120/70, pulse 63, temperature 98.1 F (36.7 C), temperature source Oral, weight 170 lb (77.1 kg), SpO2 96 %.   [place], well-nourished, in no distress, normal affect   HEENT: [place]/AT, face symmetric, L eye with sty on upper medial lid.  R eye with firm area proximal to tear duct.  Conjunctiva clear, no scleral icterus, PERRLA, EOMI, nares patent without drainage Lungs: no accessory muscle use, CTAB, no wheezes or rales Cardiovascular: RRR, no m/r/g, no peripheral edema Abdomen: BS present, soft, NT/ND, no hepatosplenomegaly. Musculoskeletal: No deformities, no cyanosis or clubbing, normal tone Neuro:  A&Ox3, CN II-XII intact, normal gait Skin:  Warm, dry, intact.  Several firm, cystic structures on face.  3 along inferior orbit of R eye.  2 lateral to L eye.        Wt Readings from  Last 3 Encounters:  01/12/20 170 lb (77.1 kg)  12/28/19 181 lb (82.1 kg)  12/14/19 181 lb (82.1 kg)    Lab Results  Component Value Date   WBC 6.6 07/13/2019   HGB 15.0 07/13/2019   HCT 44.4 07/13/2019   PLT 259.0 07/13/2019   GLUCOSE 120 (H) 07/13/2019   CHOL 231 (H) 07/13/2019   TRIG 94.0 07/13/2019   HDL 54.30 07/13/2019   LDLCALC 158 (H) 07/13/2019   ALT 13 11/03/2018   AST 13 11/03/2018   NA 137 07/13/2019   K 4.5 07/13/2019   CL 102 07/13/2019   CREATININE 0.97 07/13/2019   BUN 22 07/13/2019   CO2 25  07/13/2019   TSH 1.03 02/16/2019   INR 1.06 01/16/2015   HGBA1C 6.7 (H) 07/13/2019    Assessment/Plan:  Diabetes mellitus without complication (HCC)  -continue lifestyle modifications - Plan: POCT Glucose (Device for Home Use), Hemoglobin A1c  Epidermoid cyst of face  -5 cyst on face proximal to b/l eyes -discussed removal.  Will place referral to plastics given location - Plan: Ambulatory referral to Plastic Surgery  Hordeolum externum of left upper eyelid -supportive care including warm compresses QID -continue to monitor.  Fatigue, unspecified type  - Plan: TSH, T4, free, CMP with eGFR([facility]), Vitamin D, 25-hydroxy, Vitamin B12, CBC with Differential/Platelet, PSG Sleep Study  Screen for colon cancer  - Plan: Ambulatory referral to Gastroenterology  Generalized abdominal pain  - Plan: Lipase, Lipase, ambulatory referral to gastroenterology  Callus of foot  - Plan: Ambulatory referral to Podiatry  F/u prn in 1 month  Grier Mitts, MD

## 2020-01-12 NOTE — Patient Instructions (Signed)
Fatigue If you have fatigue, you feel tired all the time and have a lack of energy or a lack of motivation. Fatigue may make it difficult to start or complete tasks because of exhaustion. In general, occasional or mild fatigue is often a normal response to activity or life. However, long-lasting (chronic) or extreme fatigue may be a symptom of a medical condition. Follow these instructions at home: General instructions  Watch your fatigue for any changes.  Go to bed and get up at the same time every day.  Avoid fatigue by pacing yourself during the day and getting enough sleep at night.  Maintain a healthy weight. Medicines  Take over-the-counter and prescription medicines only as told by your health care provider.  Take a multivitamin, if told by your health care provider.  Do not use herbal or dietary supplements unless they are approved by your health care provider. Activity   Exercise regularly, as told by your health care provider.  Use or practice techniques to help you relax, such as yoga, tai chi, meditation, or massage therapy. Eating and drinking   Avoid heavy meals in the evening.  Eat a well-balanced diet, which includes lean proteins, whole grains, plenty of fruits and vegetables, and low-fat dairy products.  Avoid consuming too much caffeine.  Avoid the use of alcohol.  Drink enough fluid to keep your urine pale yellow. Lifestyle  Change situations that cause you stress. Try to keep your work and personal schedule in balance.  Do not use any products that contain nicotine or tobacco, such as cigarettes and e-cigarettes. If you need help quitting, ask your health care provider.  Do not use drugs. Contact a health care provider if:  Your fatigue does not get better.  You have a fever.  You suddenly lose or gain weight.  You have headaches.  You have trouble falling asleep or sleeping through the night.  You feel angry, guilty, anxious, or  sad.  You are unable to have a bowel movement (constipation).  Your skin is dry.  You have swelling in your legs or another part of your body. Get help right away if:  You feel confused.  Your vision is blurry.  You feel faint or you pass out.  You have a severe headache.  You have severe pain in your abdomen, your back, or the area between your waist and hips (pelvis).  You have chest pain, shortness of breath, or an irregular or fast heartbeat.  You are unable to urinate, or you urinate less than normal.  You have abnormal bleeding, such as bleeding from the rectum, vagina, nose, lungs, or nipples.  You vomit blood.  You have thoughts about hurting yourself or others. If you ever feel like you may hurt yourself or others, or have thoughts about taking your own life, get help right away. You can go to your nearest emergency department or call:  Your local emergency services (911 in the [place]  A suicide crisis helpline, such as the [facility] at 9164775271. This is open 24 hours a day. Summary  If you have fatigue, you feel tired all the time and have a lack of energy or a lack of motivation.  Fatigue may make it difficult to start or complete tasks because of exhaustion.  Long-lasting (chronic) or extreme fatigue may be a symptom of a medical condition.  Exercise regularly, as told by your health care provider.  Change situations that cause you stress. Try to keep your  work and personal schedule in balance. This information is not intended to replace advice given to you by your health care provider. Make sure you discuss any questions you have with your health care provider. Document Revised: 11/23/2018 Document Reviewed: 01/27/2017 Elsevier Patient Education  2020 [place].  Epidermal Cyst  An epidermal cyst is a sac made of skin tissue. The sac contains a substance called keratin. Keratin is a protein that is normally secreted  through the hair follicles. When keratin becomes trapped in the top layer of skin (epidermis), it can form an epidermal cyst. Epidermal cysts can be found anywhere on your body. These cysts are usually harmless (benign), and they may not cause symptoms unless they become infected. What are the causes? This condition may be caused by:  A blocked hair follicle.  A hair that curls and re-enters the skin instead of growing straight out of the skin (ingrown hair).  A blocked pore.  Irritated skin.  An injury to the skin.  Certain conditions that are passed along from parent to child (inherited).  Human papillomavirus (HPV).  Long-term (chronic) sun damage to the skin. What increases the risk? The following factors may make you more likely to develop an epidermal cyst:  Having acne.  Being overweight.  Being 85-85 years old. What are the signs or symptoms? The only symptom of this condition may be a small, painless lump underneath the skin. When an epidermal cyst ruptures, it may become infected. Symptoms may include:  Redness.  Inflammation.  Tenderness.  Warmth.  Fever.  Keratin draining from the cyst. Keratin is grayish-white, bad-smelling substance.  Pus draining from the cyst. How is this diagnosed? This condition is diagnosed with a physical exam.  In some cases, you may have a sample of tissue (biopsy) taken from your cyst to be examined under a microscope or tested for bacteria.  You may be referred to a health care provider who specializes in skin care (dermatologist). How is this treated? In many cases, epidermal cysts go away on their own without treatment. If a cyst becomes infected, treatment may include:  Opening and draining the cyst, done by a health care provider. After draining, minor surgery to remove the rest of the cyst may be done.  Antibiotic medicine.  Injections of medicines (steroids) that help to reduce inflammation.  Surgery to remove  the cyst. Surgery may be done if the cyst: ? Becomes large. ? Bothers you. ? Has a chance of turning into cancer.  Do not try to open a cyst yourself. Follow these instructions at home:  Take over-the-counter and prescription medicines only as told by your health care provider.  If you were prescribed an antibiotic medicine, take it it as told by your health care provider. Do not stop using the antibiotic even if you start to feel better.  Keep the area around your cyst clean and dry.  Wear loose, dry clothing.  Avoid touching your cyst.  Check your cyst every day for signs of infection. Check for: ? Redness, swelling, or pain. ? Fluid or blood. ? Warmth. ? Pus or a bad smell.  Keep all follow-up visits as told by your health care provider. This is important. How is this prevented?  Wear clean, dry, clothing.  Avoid wearing tight clothing.  Keep your skin clean and dry. Take showers or baths every day. Contact a health care provider if:  Your cyst develops symptoms of infection.  Your condition is not improving or is getting  worse.  You develop a cyst that looks different from other cysts you have had.  You have a fever. Get help right away if:  Redness spreads from the cyst into the surrounding area. Summary  An epidermal cyst is a sac made of skin tissue. These cysts are usually harmless (benign), and they may not cause symptoms unless they become infected.  If a cyst becomes infected, treatment may include surgery to open and drain the cyst, or to remove it. Treatment may also include medicines by mouth or through an injection.  Take over-the-counter and prescription medicines only as told by your health care provider. If you were prescribed an antibiotic medicine, take it as told by your health care provider. Do not stop using the antibiotic even if you start to feel better.  Contact a health care provider if your condition is not improving or is getting  worse.  Keep all follow-up visits as told by your health care provider. This is important. This information is not intended to replace advice given to you by your health care provider. Make sure you discuss any questions you have with your health care provider. Document Revised: 08/25/2018 Document Reviewed: 11/15/2017 Elsevier Patient Education  [place].  Abdominal Pain, Adult Many things can cause belly (abdominal) pain. Most times, belly pain is not dangerous. Many cases of belly pain can be watched and treated at home. Sometimes, though, belly pain is serious. Your doctor will try to find the cause of your belly pain. Follow these instructions at home:  Medicines  Take over-the-counter and prescription medicines only as told by your doctor.  Do not take medicines that help you poop (laxatives) unless told by your doctor. General instructions  Watch your belly pain for any changes.  Drink enough fluid to keep your pee (urine) pale yellow.  Keep all follow-up visits as told by your doctor. This is important. Contact a doctor if:  Your belly pain changes or gets worse.  You are not hungry, or you lose weight without trying.  You are having trouble pooping (constipated) or have watery poop (diarrhea) for more than 2-3 days.  You have pain when you pee or poop.  Your belly pain wakes you up at night.  Your pain gets worse with meals, after eating, or with certain foods.  You are vomiting and cannot keep anything down.  You have a fever.  You have blood in your pee. Get help right away if:  Your pain does not go away as soon as your doctor says it should.  You cannot stop vomiting.  Your pain is only in areas of your belly, such as the right side or the left lower part of the belly.  You have bloody or black poop, or poop that looks like tar.  You have very bad pain, cramping, or bloating in your belly.  You have signs of not having enough fluid or water  in your body (dehydration), such as: ? Dark pee, very little pee, or no pee. ? Cracked lips. ? Dry mouth. ? Sunken eyes. ? Sleepiness. ? Weakness.  You have trouble breathing or chest pain. Summary  Many cases of belly pain can be watched and treated at home.  Watch your belly pain for any changes.  Take over-the-counter and prescription medicines only as told by your doctor.  Contact a doctor if your belly pain changes or gets worse.  Get help right away if you have very bad pain, cramping, or bloating  in your belly. This information is not intended to replace advice given to you by your health care provider. Make sure you discuss any questions you have with your health care provider. Document Revised: 09/12/2018 Document Reviewed: 09/12/2018 Elsevier Patient Education  [place].

## 2020-01-13 LAB — COMPLETE METABOLIC PANEL WITH GFR
AG Ratio: 1.6 (calc) (ref 1.0–2.5)
ALT: 11 U/L (ref 6–29)
AST: 13 U/L (ref 10–35)
Albumin: 4.2 g/dL (ref 3.6–5.1)
Alkaline phosphatase (APISO): 66 U/L (ref 37–153)
BUN/Creatinine Ratio: 14 (calc) (ref 6–22)
BUN: 14 mg/dL (ref 7–25)
CO2: 33 mmol/L — ABNORMAL HIGH (ref 20–32)
Calcium: 10 mg/dL (ref 8.6–10.4)
Chloride: 103 mmol/L (ref 98–110)
Creat: 0.99 mg/dL — ABNORMAL HIGH (ref 0.60–0.93)
GFR, Est African American: 66 mL/min/{1.73_m2} (ref >=60)
GFR, Est Non African American: 57 mL/min/{1.73_m2} — ABNORMAL LOW (ref >=60)
Globulin: 2.7 g/dL (calc) (ref 1.9–3.7)
Glucose, Bld: 111 mg/dL — ABNORMAL HIGH (ref 65–99)
Potassium: 4.7 mmol/L (ref 3.5–5.3)
Sodium: 140 mmol/L (ref 135–146)
Total Bilirubin: 0.4 mg/dL (ref 0.2–1.2)
Total Protein: 6.9 g/dL (ref 6.1–8.1)

## 2020-01-13 LAB — CBC WITH DIFFERENTIAL/PLATELET
Absolute Monocytes: 490 cells/uL (ref 200–950)
Basophils Absolute: 53 cells/uL (ref 0–200)
Basophils Relative: 0.9 %
Eosinophils Absolute: 130 cells/uL (ref 15–500)
Eosinophils Relative: 2.2 %
HCT: 43.2 % (ref 35.0–45.0)
Hemoglobin: 14.2 g/dL (ref 11.7–15.5)
Lymphs Abs: 2867 cells/uL (ref 850–3900)
MCH: 31.4 pg (ref 27.0–33.0)
MCHC: 32.9 g/dL (ref 32.0–36.0)
MCV: 95.6 fL (ref 80.0–100.0)
MPV: 8.9 fL (ref 7.5–12.5)
Monocytes Relative: 8.3 %
Neutro Abs: 2360 cells/uL (ref 1500–7800)
Neutrophils Relative %: 40 %
Platelets: 259 10*3/uL (ref 140–400)
RBC: 4.52 10*6/uL (ref 3.80–5.10)
RDW: 12.3 % (ref 11.0–15.0)
Total Lymphocyte: 48.6 %
WBC: 5.9 10*3/uL (ref 3.8–10.8)

## 2020-01-13 LAB — HEMOGLOBIN A1C
Hgb A1c MFr Bld: 6.1 % of total Hgb — ABNORMAL HIGH (ref <5.7)
Mean Plasma Glucose: 128 (calc)
eAG (mmol/L): 7.1 (calc)

## 2020-01-13 LAB — TSH: TSH: 1.09 mIU/L (ref 0.40–4.50)

## 2020-01-13 LAB — VITAMIN B12: Vitamin B-12: 385 pg/mL (ref 200–1100)

## 2020-01-13 LAB — VITAMIN D 25 HYDROXY (VIT D DEFICIENCY, FRACTURES): Vit D, 25-Hydroxy: 27 ng/mL — ABNORMAL LOW (ref 30–100)

## 2020-01-13 LAB — T4, FREE: Free T4: 1.1 ng/dL (ref 0.8–1.8)

## 2020-01-13 LAB — LIPASE: Lipase: 32 U/L (ref 7–60)

## 2020-01-14 ENCOUNTER — Other Ambulatory Visit: Payer: Self-pay | Admitting: Cardiovascular Disease

## 2020-01-14 ENCOUNTER — Other Ambulatory Visit: Payer: Self-pay | Admitting: Family Medicine

## 2020-01-14 DIAGNOSIS — E559 Vitamin D deficiency, unspecified: Secondary | ICD-10-CM

## 2020-01-14 MED ORDER — VITAMIN D (ERGOCALCIFEROL) 1.25 MG (50000 UNIT) PO CAPS: 50000.0000 [IU] | ORAL_CAPSULE | ORAL | 0 refills | Status: DC

## 2020-01-15 ENCOUNTER — Other Ambulatory Visit: Payer: Self-pay | Admitting: Cardiovascular Disease

## 2020-01-18 ENCOUNTER — Telehealth: Payer: Self-pay | Admitting: Family Medicine

## 2020-01-18 NOTE — Telephone Encounter (Signed)
Pt is calling to get her lab results and stated that she will be out this morning and please give her a call around 4-5pm today she will have the phone glued to her.

## 2020-01-18 NOTE — Telephone Encounter (Signed)
Returned pt call, left a voice message to call the office back regarding her lab results

## 2020-01-19 ENCOUNTER — Telehealth: Payer: Self-pay | Admitting: Family Medicine

## 2020-01-19 NOTE — Telephone Encounter (Signed)
Pt is returning a call. Please call back to discuss lab report  Please advise

## 2020-01-19 NOTE — Telephone Encounter (Signed)
Reviewed lab results and recommendations with pt, verbalized understanding

## 2020-01-25 ENCOUNTER — Other Ambulatory Visit: Payer: Self-pay | Admitting: Family Medicine

## 2020-01-25 ENCOUNTER — Other Ambulatory Visit: Payer: Self-pay | Admitting: Specialist

## 2020-01-25 DIAGNOSIS — L853 Xerosis cutis: Secondary | ICD-10-CM

## 2020-01-29 ENCOUNTER — Telehealth: Payer: Self-pay | Admitting: Family Medicine

## 2020-01-29 NOTE — Telephone Encounter (Signed)
error 

## 2020-02-09 ENCOUNTER — Telehealth: Payer: Self-pay | Admitting: Family Medicine

## 2020-02-09 NOTE — Telephone Encounter (Signed)
Patient wants to know if you have received the fax with her info from [facility] and have you sent it on the the [facility] dr ya'll are referring her to.

## 2020-02-13 NOTE — Telephone Encounter (Signed)
Left pt a voice message regarding her medical records, advised pt to call the office

## 2020-02-15 ENCOUNTER — Other Ambulatory Visit: Payer: Self-pay

## 2020-02-15 ENCOUNTER — Encounter: Payer: Self-pay | Admitting: Plastic Surgery

## 2020-02-15 ENCOUNTER — Ambulatory Visit (INDEPENDENT_AMBULATORY_CARE_PROVIDER_SITE_OTHER): Payer: Medicare Other | Admitting: Plastic Surgery

## 2020-02-15 VITALS — BP 148/77 | HR 61 | Temp 97.8°F | Ht 69.5 in | Wt 171.0 lb

## 2020-02-15 DIAGNOSIS — L723 Sebaceous cyst: Secondary | ICD-10-CM | POA: Diagnosis not present

## 2020-02-15 NOTE — Telephone Encounter (Signed)
Pt Medical records have been faxed to [facility] per pt request. Pt is aware and was advised to call the GI office for scheduling

## 2020-02-15 NOTE — Progress Notes (Signed)
Referring Provider [name], MD [place],  [place] [id number]   CC:  Chief Complaint  Patient presents with  . Advice Only      Angela Pugh is an 74 y.o. female.  HPI: Patient presents to discuss multiple cyst on her face.  There is one in particular near her right medial canthus that is bothering her.  It intermittently swells but is never drained.  There is also one in the infraorbital area and one closer to the lateral canthus that are bothersome for her.  She has a number of these elsewhere on her face and has been bothered by them for quite a number of years.  Some other cysts on her face have been previously excised.  She has seen an ophthalmologist about the one near her medial canthus and he confirmed that there was no tear duct involvement and recommended excision by me.  The areas are intermittently painful and the one closer to her medial canthus will intermittently obstruct her vision.  The others have grown in size over the last few months.  Allergies  Allergen Reactions  . Acetaminophen Hives and Nausea Only  . Atorvastatin Nausea Only    weakness  . Azithromycin Hives and Nausea And Vomiting  . Zyrtec [Cetirizine]     Hives     Outpatient Encounter Medications as of 02/15/2020  Medication Sig  . amLODipine (NORVASC) 5 MG tablet TAKE 1 TABLET(5 MG) BY MOUTH DAILY  . aspirin 81 MG EC tablet Take 81 mg by mouth daily. Swallow whole.  . fluticasone (FLONASE) 50 MCG/ACT nasal spray Place 1 spray into both nostrils daily as needed for allergies or rhinitis.  Marland Kitchen loratadine (CLARITIN) 10 MG tablet Take 1 tablet (10 mg total) by mouth daily.  . meloxicam (MOBIC) 15 MG tablet TAKE 1/2 TABLET(7.5 MG) BY MOUTH DAILY  . Multiple Vitamins-Minerals (CENTRUM ADULTS PO) Take 1 tablet by mouth daily.   Marland Kitchen olmesartan (BENICAR) 5 MG tablet TAKE 2 TABLETS BY MOUTH DAILY  . Omega-3 1000 MG CAPS Take 1,000 mg by mouth daily.   . ondansetron (ZOFRAN ODT) 4  MG disintegrating tablet Take 1 tablet (4 mg total) by mouth every 8 (eight) hours as needed for nausea or vomiting.  . potassium chloride SA (KLOR-CON) 20 MEQ tablet TK 2 TS PO ONCE D FOR 1 DOSE.  Marland Kitchen triamcinolone cream (KENALOG) 0.5 % APPLY EXTERNALLY TO THE AFFECTED AREA TWICE DAILY AS NEEDED FOR DRY SKIN  . Vitamin D, Ergocalciferol, (DRISDOL) 1.25 MG (50000 UNIT) CAPS capsule Take 1 capsule (50,000 Units total) by mouth every 7 (seven) days.   No facility-administered encounter medications on file as of 02/15/2020.     Past Medical History:  Diagnosis Date  . Arthritis    back, knees, shoulder, hands , injections in pelvis, for bone spurs (05/04/2016)  . Chronic back pain   . Diverticulosis   . History of kidney stones   . HLD (hyperlipidemia)   . Hypertension   . Migraine    hx (05/04/2016)  . Peripheral neuropathy   . Radiculopathy   . Sinus complaint   . Sinus headache    occasional (05/04/2016)  . Type II diabetes mellitus (HCC)    diet controlled, no meds now, was on insulin & po treatment at one time    . Vertigo     Past Surgical History:  Procedure Laterality Date  . ANTERIOR CERVICAL DECOMP/DISCECTOMY FUSION N/A 04/17/2016   Procedure: ANTERIOR CERVICAL  DECOMPRESSION/DISCECTOMY FUSION CERVICAL THREE - CERVICAL FOUR, POSTERIOR CERVICAL DISCECTOMY CERVICAL SEVEN -THORASIC ONE LEFT;  Surgeon: Ashok Pall, MD;  Location: [facility];  Service: Neurosurgery;  Laterality: N/A;  Anterior/Posterior  . ANTERIOR FUSION CERVICAL SPINE  2000   Dr. Carloyn Manner; "for bone spurs; put hardware in too"  . BACK SURGERY    . LEFT HEART CATH AND CORONARY ANGIOGRAPHY N/A 03/17/2018   Procedure: LEFT HEART CATH AND CORONARY ANGIOGRAPHY;  Surgeon: Lorretta Harp, MD;  Location: [facility];  Service: Cardiovascular;  Laterality: N/A;  . LUMBAR LAMINECTOMY Left 06/30/2013   Procedure: Left L3-4 Extraforaminal approach to excise far lateral herniated nucleus pulposus;  Surgeon: Jessy Oto,  MD;  Location: [facility];  Service: Orthopedics;  Laterality: Left;  . TUBAL LIGATION    . VAGINAL HYSTERECTOMY      Family History  Problem Relation Age of Onset  . Uterine cancer Mother   . Diabetes type II Sister   . Diabetes Mellitus II Brother     Social History   Social History Narrative  . Not on file     Review of Systems General: Denies fevers, chills, weight loss CV: Denies chest pain, shortness of breath, palpitations  Physical Exam Vitals with BMI 02/15/2020 01/12/2020 12/28/2019  Height 5' 9.5" - 5' 8.75"  Weight 171 lbs 170 lbs 181 lbs  BMI 24.9 48.27 07.86  Systolic 754 492 -  Diastolic 77 70 -  Pulse 61 63 -    General:  No acute distress,  Alert and oriented, Non-Toxic, Normal speech and affect Examination shows multiple facial cyst.  The ones that she is focused on are each 1.5 to 2 cm in size.  They are freely mobile and feel like they are in the subcutaneous space but very close to the surface.  As described above there by the medial canthus, directly infraorbital, and closer to the lateral canthus.  There is no overlying skin changes.  Assessment/Plan Patient presents with several facial cyst that are symptomatic and growing in size.  I recommended excision.  We discussed the risk that include bleeding, infection, damage to surrounding structures, need for additional procedures.  We will plan to proceed with excision of three of these under local in the office.  All her questions were answered.  [name] 02/15/2020, 10:04 AM

## 2020-02-16 ENCOUNTER — Telehealth: Payer: Self-pay | Admitting: Family Medicine

## 2020-02-16 NOTE — Telephone Encounter (Signed)
Left message for patient to schedule Annual Wellness Visit.  Please schedule with Nurse Health Advisor Shannon Crews, RN at [facility]  

## 2020-02-19 ENCOUNTER — Encounter: Payer: Self-pay | Admitting: Nurse Practitioner

## 2020-02-19 ENCOUNTER — Other Ambulatory Visit: Payer: Self-pay | Admitting: Family Medicine

## 2020-02-19 DIAGNOSIS — Z0000 Encounter for general adult medical examination without abnormal findings: Secondary | ICD-10-CM

## 2020-02-21 ENCOUNTER — Ambulatory Visit (INDEPENDENT_AMBULATORY_CARE_PROVIDER_SITE_OTHER): Payer: Medicare Other

## 2020-02-21 ENCOUNTER — Other Ambulatory Visit: Payer: Self-pay

## 2020-02-21 DIAGNOSIS — Z0000 Encounter for general adult medical examination without abnormal findings: Secondary | ICD-10-CM | POA: Diagnosis not present

## 2020-02-21 NOTE — Progress Notes (Signed)
Subjective:   Angela Pugh is a 74 y.o. female who presents for Medicare Annual (Subsequent) preventive examination.  I connected with Angela Pugh today by telephone and verified that I am speaking with the correct person using two identifiers. Location patient: home Location provider: work Persons participating in the virtual visit: patient, provider.   I discussed the limitations, risks, security and privacy concerns of performing an evaluation and management service by telephone and the availability of in person appointments. I also discussed with the patient that there may be a patient responsible charge related to this service. The patient expressed understanding and verbally consented to this telephonic visit.    Interactive audio and video telecommunications were attempted between this provider and patient, however failed, due to patient having technical difficulties OR patient did not have access to video capability.  We continued and completed visit with audio only.      Review of Systems    N/A Cardiac Risk Factors include: advanced age (>73men, >87 women);hypertension;diabetes mellitus;dyslipidemia     Objective:    Today's Vitals   02/21/20 0818  PainSc: 4    There is no height or weight on file to calculate BMI.  Advanced Directives 02/21/2020 10/17/2018 04/13/2018 04/12/2018 03/17/2018 05/05/2016 05/04/2016  Does Patient Have a Medical Advance Directive? Yes Yes Yes Yes Yes Yes Yes  Type of Paramedic of [place];Living will Living will [place];Living will [place];Living will [place];Living will [place];Living will [place];Living will  Does patient want to make changes to medical advance directive? No - Patient declined - No - Patient declined - No - Patient declined No - Patient declined -  Copy of [place] in  Chart? No - copy requested - No - copy requested No - copy requested Yes - Yes  Would patient like information on creating a medical advance directive? - - - - - - No - Patient declined  Pre-existing out of facility DNR order (yellow form or pink MOST form) - - - - - - -    Current Medications (verified) Outpatient Encounter Medications as of 02/21/2020  Medication Sig  . amLODipine (NORVASC) 5 MG tablet TAKE 1 TABLET(5 MG) BY MOUTH DAILY  . aspirin 81 MG EC tablet Take 81 mg by mouth daily. Swallow whole.  . fluticasone (FLONASE) 50 MCG/ACT nasal spray Place 1 spray into both nostrils daily as needed for allergies or rhinitis.  Marland Kitchen loratadine (CLARITIN) 10 MG tablet Take 1 tablet (10 mg total) by mouth daily.  . Multiple Vitamins-Minerals (CENTRUM ADULTS PO) Take 1 tablet by mouth daily.   Marland Kitchen olmesartan (BENICAR) 5 MG tablet TAKE 2 TABLETS BY MOUTH DAILY  . Omega-3 1000 MG CAPS Take 1,000 mg by mouth daily.   . potassium chloride SA (KLOR-CON) 20 MEQ tablet TK 2 TS PO ONCE D FOR 1 DOSE.  Marland Kitchen triamcinolone cream (KENALOG) 0.5 % APPLY EXTERNALLY TO THE AFFECTED AREA TWICE DAILY AS NEEDED FOR DRY SKIN  . Vitamin D, Ergocalciferol, (DRISDOL) 1.25 MG (50000 UNIT) CAPS capsule Take 1 capsule (50,000 Units total) by mouth every 7 (seven) days.  . meloxicam (MOBIC) 15 MG tablet TAKE 1/2 TABLET(7.5 MG) BY MOUTH DAILY (Patient not taking: Reported on 02/21/2020)  . ondansetron (ZOFRAN ODT) 4 MG disintegrating tablet Take 1 tablet (4 mg total) by mouth every 8 (eight) hours as needed for nausea or vomiting. (Patient not taking: Reported on  02/21/2020)   No facility-administered encounter medications on file as of 02/21/2020.    Allergies (verified) Acetaminophen, Atorvastatin, Azithromycin, and Zyrtec [cetirizine]   History: Past Medical History:  Diagnosis Date  . Arthritis    back, knees, shoulder, hands , injections in pelvis, for bone spurs (05/04/2016)  . Chronic back pain   . Diverticulosis   .  History of kidney stones   . HLD (hyperlipidemia)   . Hypertension   . Migraine    hx (05/04/2016)  . Peripheral neuropathy   . Radiculopathy   . Sinus complaint   . Sinus headache    occasional (05/04/2016)  . Type II diabetes mellitus (HCC)    diet controlled, no meds now, was on insulin & po treatment at one time    . Vertigo    Past Surgical History:  Procedure Laterality Date  . ANTERIOR CERVICAL DECOMP/DISCECTOMY FUSION N/A 04/17/2016   Procedure: ANTERIOR CERVICAL DECOMPRESSION/DISCECTOMY FUSION CERVICAL THREE - CERVICAL FOUR, POSTERIOR CERVICAL DISCECTOMY CERVICAL SEVEN -THORASIC ONE LEFT;  Surgeon: Ashok Pall, MD;  Location: [facility];  Service: Neurosurgery;  Laterality: N/A;  Anterior/Posterior  . ANTERIOR FUSION CERVICAL SPINE  2000   Dr. Carloyn Manner; "for bone spurs; put hardware in too"  . BACK SURGERY    . LEFT HEART CATH AND CORONARY ANGIOGRAPHY N/A 03/17/2018   Procedure: LEFT HEART CATH AND CORONARY ANGIOGRAPHY;  Surgeon: Lorretta Harp, MD;  Location: [facility];  Service: Cardiovascular;  Laterality: N/A;  . LUMBAR LAMINECTOMY Left 06/30/2013   Procedure: Left L3-4 Extraforaminal approach to excise far lateral herniated nucleus pulposus;  Surgeon: Jessy Oto, MD;  Location: [facility];  Service: Orthopedics;  Laterality: Left;  . TUBAL LIGATION    . VAGINAL HYSTERECTOMY     Family History  Problem Relation Age of Onset  . Uterine cancer Mother   . Diabetes type II Sister   . Diabetes Mellitus II Brother    Social History   Socioeconomic History  . Marital status: Widowed    Spouse name: Not on file  . Number of children: Not on file  . Years of education: Not on file  . Highest education level: Not on file  Occupational History  . Not on file  Tobacco Use  . Smoking status: Former Smoker    Packs/day: 0.50    Years: 20.00    Pack years: 10.00    Types: Cigarettes    Quit date: 06/30/1982    Years since quitting: 37.6  . Smokeless tobacco: Never Used   Vaping Use  . Vaping Use: Never used  Substance and Sexual Activity  . Alcohol use: Yes    Comment: 05/04/2016 "might have a drink a couple days/year; might not"  . Drug use: No  . Sexual activity: Not Currently  Other Topics Concern  . Not on file  Social History Narrative  . Not on file   Social Determinants of Health   Financial Resource Strain: Low Risk   . Difficulty of Paying Living Expenses: Not hard at all  Food Insecurity: No Food Insecurity  . Worried About Charity fundraiser in the Last Year: Never true  . Ran Out of Food in the Last Year: Never true  Transportation Needs: No Transportation Needs  . Lack of Transportation (Medical): No  . Lack of Transportation (Non-Medical): No  Physical Activity: Inactive  . Days of Exercise per Week: 0 days  . Minutes of Exercise per Session: 0 min  Stress: No Stress  Concern Present  . Feeling of Stress : Not at all  Social Connections: Moderately Isolated  . Frequency of Communication with Friends and Family: More than three times a week  . Frequency of Social Gatherings with Friends and Family: Once a week  . Attends Religious Services: More than 4 times per year  . Active Member of Clubs or Organizations: No  . Attends Archivist Meetings: Never  . Marital Status: Widowed    Tobacco Counseling Counseling given: Not Answered   Clinical Intake:  Pre-visit preparation completed: Yes  Pain : 0-10 Pain Score: 4  Pain Type: Acute pain Pain Location: Neck Pain Orientation: Left Pain Radiating Towards: radiating towards ear Pain Descriptors / Indicators: Discomfort Pain Onset: In the past 7 days Pain Frequency: Intermittent Pain Relieving Factors: Take claritin D, Zicam sinus allergy  Pain Relieving Factors: Take claritin D, Zicam sinus allergy  Nutritional Risks: Unintentional weight loss, Nausea/ vomitting/ diarrhea (Patient states that she has lost 16 pounds has been having nausea) Diabetes: Yes  (Patient states checks only checks blood sugar if she is feeling bad) CBG done?: No Did pt. bring in CBG monitor from home?: No  How often do you need to have someone help you when you read instructions, pamphlets, or other written materials from your doctor or pharmacy?: 1 - Never What is the last grade level you completed in school?: Associates Degree  Diabetic?yes  Interpreter Needed?: No  Information entered by :: [place] of Daily Living In your present state of health, do you have any difficulty performing the following activities: 02/21/2020  Hearing? N  Vision? N  Difficulty concentrating or making decisions? Y  Comment Sometimes has issues with short term memory.  Walking or climbing stairs? N  Dressing or bathing? N  Doing errands, shopping? N  Preparing Food and eating ? N  Using the Toilet? N  In the past six months, have you accidently leaked urine? N  Do you have problems with loss of bowel control? N  Managing your Medications? N  Managing your Finances? N  Housekeeping or managing your Housekeeping? N  Some recent data might be hidden    Patient Care Team: [name], MD as PCP - General (Family Medicine) Bensimhon, Shaune Pascal, MD as PCP - Advanced Heart Failure (Cardiology)  Indicate any recent Medical Services you may have received from other than [facility] providers in the past year (date may be approximate).     Assessment:   This is a routine wellness examination for [place].  Hearing/Vision screen  Hearing Screening   125Hz  250Hz  500Hz  1000Hz  2000Hz  3000Hz  4000Hz  6000Hz  8000Hz   Right ear:           Left ear:           Vision Screening Comments: Patient stated gets eyes checked yearly. Has 3 cyst on right eye  Dietary issues and exercise activities discussed: Current Exercise Habits: The patient does not participate in regular exercise at present  Goals    . Patient Stated     I will try to walk as much as possible and ride my  bicycle       Depression Screen PHQ 2/9 Scores 02/21/2020 02/16/2019 10/18/2017  PHQ - 2 Score 0 0 0  PHQ- 9 Score 0 - -    Fall Risk Fall Risk  02/21/2020 10/18/2017  Falls in the past year? 0 No  Number falls in past yr: 0 -  Injury with Fall? 0 -  Risk for fall due to : No Fall Risks -  Follow up Falls evaluation completed;Falls prevention discussed -    Any stairs in or around the home? No  If so, are there any without handrails? No  Home free of loose throw rugs in walkways, pet beds, electrical cords, etc? Yes  Adequate lighting in your home to reduce risk of falls? Yes   ASSISTIVE DEVICES UTILIZED TO PREVENT FALLS:  Life alert? No  Use of a cane, walker or w/c? No  Grab bars in the bathroom? No  Shower chair or bench in shower? Yes  Elevated toilet seat or a handicapped toilet? No    Cognitive Function:     6CIT Screen 02/21/2020  What Year? 0 points  What month? 0 points  What time? 0 points  Count back from 20 0 points  Months in reverse 0 points  Repeat phrase 0 points  Total Score 0    Immunizations Immunization History  Administered Date(s) Administered  . PFIZER SARS-COV-2 Vaccination 06/24/2019, 07/15/2019    TDAP status: Due, Education has been provided regarding the importance of this vaccine. Advised may receive this vaccine at local pharmacy or Health Dept. Aware to provide a copy of the vaccination record if obtained from local pharmacy or Health Dept. Verbalized acceptance and understanding. Flu Vaccine status: Declined, Education has been provided regarding the importance of this vaccine but patient still declined. Advised may receive this vaccine at local pharmacy or Health Dept. Aware to provide a copy of the vaccination record if obtained from local pharmacy or Health Dept. Verbalized acceptance and understanding. Pneumococcal vaccine status: Declined,  Education has been provided regarding the importance of this vaccine but patient still declined.  Advised may receive this vaccine at local pharmacy or Health Dept. Aware to provide a copy of the vaccination record if obtained from local pharmacy or Health Dept. Verbalized acceptance and understanding.  Covid-19 vaccine status: Completed vaccines  Qualifies for Shingles Vaccine? Yes   Zostavax completed No   Shingrix Completed?: No.    Education has been provided regarding the importance of this vaccine. Patient has been advised to call insurance company to determine out of pocket expense if they have not yet received this vaccine. Advised may also receive vaccine at local pharmacy or Health Dept. Verbalized acceptance and understanding.  Screening Tests Health Maintenance  Topic Date Due  . Hepatitis C Screening  Never done  . TETANUS/TDAP  Never done  . COLONOSCOPY  Never done  . INFLUENZA VACCINE  Never done  . PNA vac Low Risk Adult (1 of 2 - PCV13) 03/29/2020 (Originally 08/28/2011)  . FOOT EXAM  05/25/2020  . HEMOGLOBIN A1C  07/14/2020  . OPHTHALMOLOGY EXAM  01/07/2021  . MAMMOGRAM  03/27/2021  . DEXA SCAN  Completed  . COVID-19 Vaccine  Completed    Health Maintenance  Health Maintenance Due  Topic Date Due  . Hepatitis C Screening  Never done  . TETANUS/TDAP  Never done  . COLONOSCOPY  Never done  . INFLUENZA VACCINE  Never done    Colorectal cancer screening: Referral to GI placed 01/12/2020. Pt aware the office will call re: appt. Mammogram status: Ordered 02/19/2020. Pt provided with contact info and advised to call to schedule appt.  Bone Density status: Completed 08/17/2019. Results reflect: Bone density results: NORMAL. Repeat every 0 years.  Lung Cancer Screening: (Low Dose CT Chest recommended if Age 27-80 years, 30 pack-year currently smoking OR have quit w/in 15years.) does not  qualify.   Lung Cancer Screening Referral: N/A  Additional Screening:  Hepatitis C Screening: does qualify;   Vision Screening: Recommended annual ophthalmology exams for early  detection of glaucoma and other disorders of the eye. Is the patient up to date with their annual eye exam?  Yes  Who is the provider or what is the name of the office in which the patient attends annual eye exams? Dr. Kathlen Mody  If pt is not established with a provider, would they like to be referred to a provider to establish care? No .   Dental Screening: Recommended annual dental exams for proper oral hygiene  Community Resource Referral / Chronic Care Management: CRR required this visit?  No   CCM required this visit?  No      Plan:     I have personally reviewed and noted the following in the patient's chart:   . Medical and social history . Use of alcohol, tobacco or illicit drugs  . Current medications and supplements . Functional ability and status . Nutritional status . Physical activity . Advanced directives . List of other physicians . Hospitalizations, surgeries, and ER visits in previous 12 months . Vitals . Screenings to include cognitive, depression, and falls . Referrals and appointments  In addition, I have reviewed and discussed with patient certain preventive protocols, quality metrics, and best practice recommendations. A written personalized care plan for preventive services as well as general preventive health recommendations were provided to patient.     Ofilia Neas, LPN   96/06/2295   Nurse Notes: None

## 2020-02-21 NOTE — Patient Instructions (Signed)
Angela Pugh , Thank you for taking time to come for your Medicare Wellness Visit. I appreciate your ongoing commitment to your health goals. Please review the following plan we discussed and let me know if I can assist you in the future.   Screening recommendations/referrals: Colonoscopy: No records on file of colonoscopy. If you have had this outside of [facility] please have records sent to [place] Mammogram: No records on file.  Bone Density: No records on file  Recommended yearly ophthalmology/optometry visit for glaucoma screening and checkup Recommended yearly dental visit for hygiene and checkup  Vaccinations: Influenza vaccine: Currently due, you may receive in our office or at your local pharmacy. Pneumococcal vaccine: Currently due, you may receive in our office at your next office visit. Tdap vaccine: Currently due, you may contact your insurance company to discuss cost or await and injury to receive  Shingles vaccine: Currently due, please contact your pharmacy to discuss cost and to receive these vaccines    Advanced directives: Please bring copies of your Advanced Medical Directives into the office so that we may scan them into your chart  Conditions/risks identified: None   Next appointment: None   Preventive Care 65 Years and Older, Female Preventive care refers to lifestyle choices and visits with your health care provider that can promote health and wellness. What does preventive care include?  A yearly physical exam. This is also called an annual well check.  Dental exams once or twice a year.  Routine eye exams. Ask your health care provider how often you should have your eyes checked.  Personal lifestyle choices, including:  Daily care of your teeth and gums.  Regular physical activity.  Eating a healthy diet.  Avoiding tobacco and drug use.  Limiting alcohol use.  Practicing safe sex.  Taking low-dose aspirin every day.  Taking vitamin and mineral  supplements as recommended by your health care provider. What happens during an annual well check? The services and screenings done by your health care provider during your annual well check will depend on your age, overall health, lifestyle risk factors, and family history of disease. Counseling  Your health care provider may ask you questions about your:  Alcohol use.  Tobacco use.  Drug use.  Emotional well-being.  Home and relationship well-being.  Sexual activity.  Eating habits.  History of falls.  Memory and ability to understand (cognition).  Work and work Statistician.  Reproductive health. Screening  You may have the following tests or measurements:  Height, weight, and BMI.  Blood pressure.  Lipid and cholesterol levels. These may be checked every 5 years, or more frequently if you are over 31 years old.  Skin check.  Lung cancer screening. You may have this screening every year starting at age 46 if you have a 30-pack-year history of smoking and currently smoke or have quit within the past 15 years.  Fecal occult blood test (FOBT) of the stool. You may have this test every year starting at age 33.  Flexible sigmoidoscopy or colonoscopy. You may have a sigmoidoscopy every 5 years or a colonoscopy every 10 years starting at age 74.  Hepatitis C blood test.  Hepatitis B blood test.  Sexually transmitted disease (STD) testing.  Diabetes screening. This is done by checking your blood sugar (glucose) after you have not eaten for a while (fasting). You may have this done every 1-3 years.  Bone density scan. This is done to screen for osteoporosis. You may have this done  starting at age 70.  Mammogram. This may be done every 1-2 years. Talk to your health care provider about how often you should have regular mammograms. Talk with your health care provider about your test results, treatment options, and if necessary, the need for more tests. Vaccines  Your  health care provider may recommend certain vaccines, such as:  Influenza vaccine. This is recommended every year.  Tetanus, diphtheria, and acellular pertussis (Tdap, Td) vaccine. You may need a Td booster every 10 years.  Zoster vaccine. You may need this after age 82.  Pneumococcal 13-valent conjugate (PCV13) vaccine. One dose is recommended after age 23.  Pneumococcal polysaccharide (PPSV23) vaccine. One dose is recommended after age 45. Talk to your health care provider about which screenings and vaccines you need and how often you need them. This information is not intended to replace advice given to you by your health care provider. Make sure you discuss any questions you have with your health care provider. Document Released: 05/31/2015 Document Revised: 01/22/2016 Document Reviewed: 03/05/2015 Elsevier Interactive Patient Education  2017 [place] Prevention in the Home Falls can cause injuries. They can happen to people of all ages. There are many things you can do to make your home safe and to help prevent falls. What can I do on the outside of my home?  Regularly fix the edges of walkways and driveways and fix any cracks.  Remove anything that might make you trip as you walk through a door, such as a raised step or threshold.  Trim any bushes or trees on the path to your home.  Use bright outdoor lighting.  Clear any walking paths of anything that might make someone trip, such as rocks or tools.  Regularly check to see if handrails are loose or broken. Make sure that both sides of any steps have handrails.  Any raised decks and porches should have guardrails on the edges.  Have any leaves, snow, or ice cleared regularly.  Use sand or salt on walking paths during winter.  Clean up any spills in your garage right away. This includes oil or grease spills. What can I do in the bathroom?  Use night lights.  Install grab bars by the toilet and in the tub and  shower. Do not use towel bars as grab bars.  Use non-skid mats or decals in the tub or shower.  If you need to sit down in the shower, use a plastic, non-slip stool.  Keep the floor dry. Clean up any water that spills on the floor as soon as it happens.  Remove soap buildup in the tub or shower regularly.  Attach bath mats securely with double-sided non-slip rug tape.  Do not have throw rugs and other things on the floor that can make you trip. What can I do in the bedroom?  Use night lights.  Make sure that you have a light by your bed that is easy to reach.  Do not use any sheets or blankets that are too big for your bed. They should not hang down onto the floor.  Have a firm chair that has side arms. You can use this for support while you get dressed.  Do not have throw rugs and other things on the floor that can make you trip. What can I do in the kitchen?  Clean up any spills right away.  Avoid walking on wet floors.  Keep items that you use a lot in easy-to-reach places.  If you need to reach something above you, use a strong step stool that has a grab bar.  Keep electrical cords out of the way.  Do not use floor polish or wax that makes floors slippery. If you must use wax, use non-skid floor wax.  Do not have throw rugs and other things on the floor that can make you trip. What can I do with my stairs?  Do not leave any items on the stairs.  Make sure that there are handrails on both sides of the stairs and use them. Fix handrails that are broken or loose. Make sure that handrails are as long as the stairways.  Check any carpeting to make sure that it is firmly attached to the stairs. Fix any carpet that is loose or worn.  Avoid having throw rugs at the top or bottom of the stairs. If you do have throw rugs, attach them to the floor with carpet tape.  Make sure that you have a light switch at the top of the stairs and the bottom of the stairs. If you do not  have them, ask someone to add them for you. What else can I do to help prevent falls?  Wear shoes that:  Do not have high heels.  Have rubber bottoms.  Are comfortable and fit you well.  Are closed at the toe. Do not wear sandals.  If you use a stepladder:  Make sure that it is fully opened. Do not climb a closed stepladder.  Make sure that both sides of the stepladder are locked into place.  Ask someone to hold it for you, if possible.  Clearly mark and make sure that you can see:  Any grab bars or handrails.  First and last steps.  Where the edge of each step is.  Use tools that help you move around (mobility aids) if they are needed. These include:  Canes.  Walkers.  Scooters.  Crutches.  Turn on the lights when you go into a dark area. Replace any light bulbs as soon as they burn out.  Set up your furniture so you have a clear path. Avoid moving your furniture around.  If any of your floors are uneven, fix them.  If there are any pets around you, be aware of where they are.  Review your medicines with your doctor. Some medicines can make you feel dizzy. This can increase your chance of falling. Ask your doctor what other things that you can do to help prevent falls. This information is not intended to replace advice given to you by your health care provider. Make sure you discuss any questions you have with your health care provider. Document Released: 02/28/2009 Document Revised: 10/10/2015 Document Reviewed: 06/08/2014 Elsevier Interactive Patient Education  2017 [employer].

## 2020-02-27 ENCOUNTER — Ambulatory Visit: Payer: Medicare Other | Attending: Internal Medicine

## 2020-02-27 DIAGNOSIS — Z23 Encounter for immunization: Secondary | ICD-10-CM

## 2020-02-27 NOTE — Progress Notes (Signed)
   Covid-19 Vaccination Clinic  Name:  [name]    MRN: [id number] DOB: 1945-06-03  02/27/2020  [name] was observed post Covid-19 immunization for 15 minutes without incident. She was provided with Vaccine Information Sheet and instruction to access the V-Safe system.   [name] was instructed to call 911 with any severe reactions post vaccine: Marland Kitchen Difficulty breathing  . Swelling of face and throat  . A fast heartbeat  . A bad rash all over body  . Dizziness and weakness

## 2020-02-28 ENCOUNTER — Encounter: Payer: Self-pay | Admitting: Podiatry

## 2020-02-28 ENCOUNTER — Other Ambulatory Visit: Payer: Self-pay

## 2020-02-28 ENCOUNTER — Ambulatory Visit: Payer: Medicare Other | Admitting: Podiatry

## 2020-02-28 DIAGNOSIS — Q828 Other specified congenital malformations of skin: Secondary | ICD-10-CM | POA: Diagnosis not present

## 2020-02-28 DIAGNOSIS — M79674 Pain in right toe(s): Secondary | ICD-10-CM

## 2020-02-28 DIAGNOSIS — M79675 Pain in left toe(s): Secondary | ICD-10-CM

## 2020-02-28 DIAGNOSIS — E1151 Type 2 diabetes mellitus with diabetic peripheral angiopathy without gangrene: Secondary | ICD-10-CM

## 2020-02-28 DIAGNOSIS — B351 Tinea unguium: Secondary | ICD-10-CM | POA: Diagnosis not present

## 2020-03-03 NOTE — Progress Notes (Signed)
Subjective:  Patient ID: Angela Pugh, female    DOB: August 07, 1945,  MRN: [id number]  74 y.o. female presents with preventative diabetic foot care and painful porokeratotic lesion(s) b/l feet and painful mycotic toenails that limit ambulation. Aggravating factors include weightbearing with and without shoe gear. Pain for both is relieved with periodic professional debridement.   Patient states her feet are painful and 3 months is too long. She states she purchased wart remover to get core out of her porokeratotic lesion.   Review of Systems: Negative except as noted in the HPI. Denies N/V/F/Ch. Past Medical History:  Diagnosis Date  . Arthritis    back, knees, shoulder, hands , injections in pelvis, for bone spurs (05/04/2016)  . Chronic back pain   . Diverticulosis   . History of kidney stones   . HLD (hyperlipidemia)   . Hypertension   . Migraine    hx (05/04/2016)  . Peripheral neuropathy   . Radiculopathy   . Sinus complaint   . Sinus headache    occasional (05/04/2016)  . Type II diabetes mellitus (HCC)    diet controlled, no meds now, was on insulin & po treatment at one time    . Vertigo    Past Surgical History:  Procedure Laterality Date  . ANTERIOR CERVICAL DECOMP/DISCECTOMY FUSION N/A 04/17/2016   Procedure: ANTERIOR CERVICAL DECOMPRESSION/DISCECTOMY FUSION CERVICAL THREE - CERVICAL FOUR, POSTERIOR CERVICAL DISCECTOMY CERVICAL SEVEN -THORASIC ONE LEFT;  Surgeon: Ashok Pall, MD;  Location: [place];  Service: Neurosurgery;  Laterality: N/A;  Anterior/Posterior  . ANTERIOR FUSION CERVICAL SPINE  2000   Dr. Carloyn Manner; "for bone spurs; put hardware in too"  . BACK SURGERY    . LEFT HEART CATH AND CORONARY ANGIOGRAPHY N/A 03/17/2018   Procedure: LEFT HEART CATH AND CORONARY ANGIOGRAPHY;  Surgeon: Lorretta Harp, MD;  Location: [place] [facility];  Service: Cardiovascular;  Laterality: N/A;  . LUMBAR LAMINECTOMY Left 06/30/2013   Procedure: Left L3-4 Extraforaminal approach  to excise far lateral herniated nucleus pulposus;  Surgeon: Jessy Oto, MD;  Location: [place];  Service: Orthopedics;  Laterality: Left;  . TUBAL LIGATION    . VAGINAL HYSTERECTOMY     Patient Active Problem List   Diagnosis Date Noted  . Nonischemic cardiomyopathy ([place]) 04/12/2018  . Chest pain 01/25/2018  . Left bundle branch block 01/25/2018  . Carotid artery disease ([place]) 01/25/2018  . Seasonal allergies 10/18/2017  . Arthritis 10/18/2017  . Pre-diabetes 10/18/2017  . Protein-calorie malnutrition, severe 05/05/2016  . Dysphagia 05/04/2016  . HNP (herniated nucleus pulposus) with myelopathy, cervical 04/17/2016  . Abdominal pain 02/01/2016  . Acute diverticulitis 02/01/2016  . HLD (hyperlipidemia) 02/01/2016  . Diabetes mellitus without complication ([place]) 67/67/2094  . Hypokalemia 02/01/2016  . Hypertension   . Essential hypertension   . Cervical spondylosis without myelopathy 01/13/2016  . Bilateral carpal tunnel syndrome 01/13/2016  . Herniated lumbar intervertebral disc 06/30/2013    Class: Acute    Current Outpatient Medications:  .  amLODipine (NORVASC) 5 MG tablet, TAKE 1 TABLET(5 MG) BY MOUTH DAILY, Disp: 30 tablet, Rfl: 1 .  aspirin 81 MG EC tablet, Take 81 mg by mouth daily. Swallow whole., Disp: , Rfl:  .  fluticasone (FLONASE) 50 MCG/ACT nasal spray, Place 1 spray into both nostrils daily as needed for allergies or rhinitis., Disp: , Rfl:  .  gabapentin (NEURONTIN) 100 MG capsule, Take 100 mg by mouth daily., Disp: , Rfl:  .  loratadine (CLARITIN) 10 MG tablet,  Take 1 tablet (10 mg total) by mouth daily., Disp: 30 tablet, Rfl: 11 .  meloxicam (MOBIC) 15 MG tablet, TAKE 1/2 TABLET(7.5 MG) BY MOUTH DAILY, Disp: 45 tablet, Rfl: 0 .  Multiple Vitamins-Minerals (CENTRUM ADULTS PO), Take 1 tablet by mouth daily. , Disp: , Rfl:  .  olmesartan (BENICAR) 5 MG tablet, TAKE 2 TABLETS BY MOUTH DAILY, Disp: 180 tablet, Rfl: 1 .  Omega-3 1000 MG CAPS, Take 1,000 mg by mouth  daily. , Disp: , Rfl:  .  ondansetron (ZOFRAN ODT) 4 MG disintegrating tablet, Take 1 tablet (4 mg total) by mouth every 8 (eight) hours as needed for nausea or vomiting., Disp: 20 tablet, Rfl: 0 .  potassium chloride SA (KLOR-CON) 20 MEQ tablet, TK 2 TS PO ONCE D FOR 1 DOSE., Disp: , Rfl:  .  triamcinolone cream (KENALOG) 0.5 %, APPLY EXTERNALLY TO THE AFFECTED AREA TWICE DAILY AS NEEDED FOR DRY SKIN, Disp: 30 g, Rfl: 3 .  Vitamin D, Ergocalciferol, (DRISDOL) 1.25 MG (50000 UNIT) CAPS capsule, Take 1 capsule (50,000 Units total) by mouth every 7 (seven) days., Disp: 12 capsule, Rfl: 0 Allergies  Allergen Reactions  . Acetaminophen Hives and Nausea Only  . Atorvastatin Nausea Only    weakness  . Azithromycin Hives and Nausea And Vomiting  . Zyrtec [Cetirizine]     Hives    Social History   Tobacco Use  Smoking Status Former Smoker  . Packs/day: 0.50  . Years: 20.00  . Pack years: 10.00  . Types: Cigarettes  . Quit date: 06/30/1982  . Years since quitting: 37.7  Smokeless Tobacco Never Used   Objective:  There were no vitals filed for this visit. Constitutional Patient is a pleasant 74 y.o. African American female, WD, WN in NAD.Marland Kitchen  Vascular Neurovascular status unchanged b/l lower extremities. Capillary refill time to digits immediate b/l. Palpable DP pulse(s) b/l lower extremities Nonpalpable PT pulse(s) b/l lower extremities. Pedal hair sparse. Lower extremity skin temperature gradient within normal limits. No pain with calf compression b/l. No edema noted b/l lower extremities. No cyanosis or clubbing noted.  Neurologic Normal speech. Protective sensation intact 5/5 intact bilaterally with 10g monofilament b/l. Vibratory sensation intact b/l. Proprioception intact bilaterally. Clonus negative b/l.  Dermatologic Pedal skin with normal turgor, texture and tone bilaterally. No open wounds bilaterally. No interdigital macerations bilaterally. Toenails 1-5 b/l elongated, discolored,  dystrophic, thickened, crumbly with subungual debris and tenderness to dorsal palpation. Porokeratotic lesion(s) L hallux, R hallux, submet head 2 left foot, submet head 2 right foot. No erythema, no edema, no drainage, no flocculence.  Orthopedic: Normal muscle strength 5/5 to all lower extremity muscle groups bilaterally. No pain crepitus or joint limitation noted with ROM b/l. Hammertoes noted to the L 5th toe and R 5th toe.   Radiographs: None Assessment:   1. Pain due to onychomycosis of toenails of both feet   2. Porokeratosis   3. Type II diabetes mellitus with peripheral circulatory disorder [facility])    Plan:  Patient was evaluated and treated and all questions answered.  Onychomycosis with pain -Nails palliatively debridement as below. -Educated on self-care  Procedure: Nail Debridement Rationale: Pain Type of Debridement: manual, sharp debridement. Instrumentation: Nail nipper, rotary burr. Number of Nails: 10  -Examined patient. -No new findings. No new orders. -Counseled patient on dangers of  using Compound W or corn/callus removers. -Continue diabetic foot care principles. -Toenails 1-5 b/l were debrided in length and girth with sterile nail nippers and dremel without  iatrogenic bleeding.  -Painful porokeratotic lesion(s) submet head 2 left foot, submet head 2 right foot, b/l hallux pared and enucleated with sterile scalpel blade. Light bleeding addressed with Lumicain hemostatic solution to left hallux and submet head 2 right foot. Antibiotic ointment and Salinocaine applied to central core of submet head 2 lesions b/l. She is to remove in 6 hours. Apply antibiotic ointment to lesions once daily for one week. -Her diabetic shoes were here and they were dispensed on today's visit. We will schedule her for offloading of insoles for her submet head 2 lesions b/l.  Return in about 9 weeks (around 05/01/2020).  Marzetta Board, DPM

## 2020-03-04 ENCOUNTER — Encounter: Payer: Self-pay | Admitting: Plastic Surgery

## 2020-03-04 ENCOUNTER — Other Ambulatory Visit (HOSPITAL_COMMUNITY)
Admission: RE | Admit: 2020-03-04 | Discharge: 2020-03-04 | Disposition: A | Discharge status: Home / Self Care | Payer: Medicare Other | Source: Ambulatory Visit | Attending: Plastic Surgery | Admitting: Plastic Surgery

## 2020-03-04 ENCOUNTER — Ambulatory Visit: Payer: Medicare Other | Admitting: Plastic Surgery

## 2020-03-04 ENCOUNTER — Other Ambulatory Visit: Payer: Self-pay

## 2020-03-04 VITALS — BP 153/69 | HR 63 | Temp 98.2°F

## 2020-03-04 DIAGNOSIS — L723 Sebaceous cyst: Secondary | ICD-10-CM

## 2020-03-04 DIAGNOSIS — L720 Epidermal cyst: Secondary | ICD-10-CM | POA: Diagnosis not present

## 2020-03-04 NOTE — Progress Notes (Signed)
Operative Note   DATE OF OPERATION: 03/04/2020  LOCATION:    SURGICAL DEPARTMENT: Plastic Surgery  PREOPERATIVE DIAGNOSES:  Face cysts x 3  POSTOPERATIVE DIAGNOSES:  same  PROCEDURE:  1. Excision of medial canthus, infraorbital, and lateral canthus cysts measuring 1.5 cm each 2. Complex closure eyelid measuring 4.5 cm total  SURGEON: Talmadge Coventry, MD  ANESTHESIA:  Local  COMPLICATIONS: None.   INDICATIONS FOR PROCEDURE:  The patient, Angela Pugh is a 74 y.o. female born on [birth year], is here for treatment of multiple eyelid cysts. MRN: [id number]  CONSENT:  Informed consent was obtained directly from the patient. Risks, benefits and alternatives were fully discussed. Specific risks including but not limited to bleeding, infection, hematoma, seroma, scarring, pain, infection, wound healing problems, and need for further surgery were all discussed. The patient did have an ample opportunity to have questions answered to satisfaction.   DESCRIPTION OF PROCEDURE:  Local anesthesia was administered. The patient's operative site was prepped and draped in a sterile fashion. A time out was performed and all information was confirmed to be correct.  The lesions were excised with a 15 blade.  Hemostasis was obtained.  Circumferential undermining was performed and the skin was advanced and closed in layers with interrupted buried Monocryl sutures and 5-0 plain gut for the skin.  The lesion excised measured 1.5 cm each, and the total length of closure measured 4.5 cm.    The patient tolerated the procedure well.  There were no complications.

## 2020-03-05 LAB — SURGICAL PATHOLOGY

## 2020-03-14 ENCOUNTER — Other Ambulatory Visit: Payer: Self-pay | Admitting: Cardiovascular Disease

## 2020-03-18 ENCOUNTER — Other Ambulatory Visit: Payer: Self-pay | Admitting: Nurse Practitioner

## 2020-03-18 ENCOUNTER — Other Ambulatory Visit (INDEPENDENT_AMBULATORY_CARE_PROVIDER_SITE_OTHER): Payer: Medicare Other

## 2020-03-18 ENCOUNTER — Ambulatory Visit: Payer: Medicare Other | Admitting: Nurse Practitioner

## 2020-03-18 ENCOUNTER — Encounter: Payer: Self-pay | Admitting: Nurse Practitioner

## 2020-03-18 VITALS — BP 110/70 | HR 57 | Ht 69.5 in | Wt 170.0 lb

## 2020-03-18 DIAGNOSIS — R1013 Epigastric pain: Secondary | ICD-10-CM

## 2020-03-18 DIAGNOSIS — Z8719 Personal history of other diseases of the digestive system: Secondary | ICD-10-CM

## 2020-03-18 DIAGNOSIS — R1012 Left upper quadrant pain: Secondary | ICD-10-CM

## 2020-03-18 DIAGNOSIS — R1030 Lower abdominal pain, unspecified: Secondary | ICD-10-CM

## 2020-03-18 LAB — COMPREHENSIVE METABOLIC PANEL
ALT: 11 U/L (ref 0–35)
AST: 14 U/L (ref 0–37)
Albumin: 4.2 g/dL (ref 3.5–5.2)
Alkaline Phosphatase: 62 U/L (ref 39–117)
BUN: 13 mg/dL (ref 6–23)
CO2: 35 mEq/L — ABNORMAL HIGH (ref 19–32)
Calcium: 9.7 mg/dL (ref 8.4–10.5)
Chloride: 102 mEq/L (ref 96–112)
Creatinine, Ser: 0.98 mg/dL (ref 0.40–1.20)
GFR: 57.24 mL/min — ABNORMAL LOW (ref >60.00)
Glucose, Bld: 103 mg/dL — ABNORMAL HIGH (ref 70–99)
Potassium: 4 mEq/L (ref 3.5–5.1)
Sodium: 142 mEq/L (ref 135–145)
Total Bilirubin: 0.3 mg/dL (ref 0.2–1.2)
Total Protein: 7.7 g/dL (ref 6.0–8.3)

## 2020-03-18 LAB — CBC WITH DIFFERENTIAL/PLATELET
Basophils Absolute: 0.1 10*3/uL (ref 0.0–0.1)
Basophils Relative: 0.8 % (ref 0.0–3.0)
Eosinophils Absolute: 0.1 10*3/uL (ref 0.0–0.7)
Eosinophils Relative: 1.7 % (ref 0.0–5.0)
HCT: 40.8 % (ref 36.0–46.0)
Hemoglobin: 14 g/dL (ref 12.0–15.0)
Lymphocytes Relative: 49.3 % — ABNORMAL HIGH (ref 12.0–46.0)
Lymphs Abs: 3.2 10*3/uL (ref 0.7–4.0)
MCHC: 34.3 g/dL (ref 30.0–36.0)
MCV: 95.3 fl (ref 78.0–100.0)
Monocytes Absolute: 0.6 10*3/uL (ref 0.1–1.0)
Monocytes Relative: 8.7 % (ref 3.0–12.0)
Neutro Abs: 2.6 10*3/uL (ref 1.4–7.7)
Neutrophils Relative %: 39.5 % — ABNORMAL LOW (ref 43.0–77.0)
Platelets: 313 10*3/uL (ref 150.0–400.0)
RBC: 4.28 Mil/uL (ref 3.87–5.11)
RDW: 13.2 % (ref 11.5–15.5)
WBC: 6.5 10*3/uL (ref 4.0–10.5)

## 2020-03-18 LAB — C-REACTIVE PROTEIN: CRP: <1 mg/dL (ref 0.5–20.0)

## 2020-03-18 MED ORDER — PANTOPRAZOLE SODIUM 40 MG PO TBEC: DELAYED_RELEASE_TABLET | ORAL | 1 refills | Status: DC

## 2020-03-18 MED ORDER — FAMOTIDINE 20 MG PO TABS: 20.0000 mg | ORAL_TABLET | Freq: Two times a day (BID) | ORAL | 0 refills | Status: DC | PRN

## 2020-03-18 NOTE — Progress Notes (Signed)
03/18/2020 Angela Pugh [id number] [birth year]   CHIEF COMPLAINT: abdominal pain   HISTORY OF PRESENT ILLNESS: Angela Pugh is a 74 year old female with a past medical history of arthritis, chronic back pain, migraine headaches, left BBB, nonischemic cardiomyopathy with EF 35 - 40%, cardiac catheterization 03/17/2018 showed normal coronary arteries, DM II, peripheral neuropathy, kidney stones, diverticulitis and colon polyps.  She presents to our office today as referred by Dr. Grier Mitts for further evaluation regarding upper abdominal pain which has progressively worsened over the past 3 months.  She describes having epigastric pain which sometimes radiates to the LUQ which is constant and occurs daily.  No heartburn or dysphagia. She is going through 2 bottles of Mylanta weekly.  She takes aspirin 81 mg daily.  No other NSAID use.  She gets full with a few bites of food.  She has some nausea without vomiting.  Her abdomen distends and feels hard at times.  Her bowel pattern varies.  She can pass a normal formed bowel movement daily or she could pass pellets of stool followed by loose stool later the same day.  No rectal bleeding or melena.  No fever, sweats or chills.  She denies having any weight loss.  She was previously followed by Dr. Penelope Coop with [facility].  She was admitted to the hospital for acute sigmoid diverticulitis in 2017 treated with Cipro an Flagyl.  She underwent a colonoscopy by Dr. Penelope Coop on 06/10/2016 which identified diverticulosis in the sigmoid colon otherwise was normal.  However, the procedure was incomplete, the scope advanced to the splenic flexure as the patient tolerated the procedure poorly due to gagging and there was concern about airway protection therefore the colonoscopy was stopped. MAC was utilized for this procedure. The bowel prep was good. A repeat colonoscopy in 2 years for surveillance was recommended.  She denies having any prior history of colon  polyps, however, the office consult note from Dr. Penelope Coop 03/16/2016 reported she had a colonoscopy  In 2014 or 2015 which showed diverticulosis as well as a colon polyp which was removed.  No family history of colon cancer.   Past Medical History:  Diagnosis Date  . Arthritis    back, knees, shoulder, hands , injections in pelvis, for bone spurs (05/04/2016)  . Chronic back pain   . Diverticulosis   . History of kidney stones   . HLD (hyperlipidemia)   . Hypertension   . Migraine    hx (05/04/2016)  . Peripheral neuropathy   . Radiculopathy   . Sinus complaint   . Sinus headache    occasional (05/04/2016)  . Type II diabetes mellitus (HCC)    diet controlled, no meds now, was on insulin & po treatment at one time    . Vertigo    Past Surgical History:  Procedure Laterality Date  . ANTERIOR CERVICAL DECOMP/DISCECTOMY FUSION N/A 04/17/2016   Procedure: ANTERIOR CERVICAL DECOMPRESSION/DISCECTOMY FUSION CERVICAL THREE - CERVICAL FOUR, POSTERIOR CERVICAL DISCECTOMY CERVICAL SEVEN -THORASIC ONE LEFT;  Surgeon: Ashok Pall, MD;  Location: [place];  Service: Neurosurgery;  Laterality: N/A;  Anterior/Posterior  . ANTERIOR FUSION CERVICAL SPINE  2000   Dr. Carloyn Manner; "for bone spurs; put hardware in too"  . BACK SURGERY    . LEFT HEART CATH AND CORONARY ANGIOGRAPHY N/A 03/17/2018   Procedure: LEFT HEART CATH AND CORONARY ANGIOGRAPHY;  Surgeon: Lorretta Harp, MD;  Location: [place] [facility];  Service: Cardiovascular;  Laterality: N/A;  .  LUMBAR LAMINECTOMY Left 06/30/2013   Procedure: Left L3-4 Extraforaminal approach to excise far lateral herniated nucleus pulposus;  Surgeon: Jessy Oto, MD;  Location: [place];  Service: Orthopedics;  Laterality: Left;  . TUBAL LIGATION    . VAGINAL HYSTERECTOMY     Social History: She is widowed.  She has 2 sons and 3 daughters.  She is a [employer].  She smoked cigarettes < 1ppd x 20 years quit smoking 30+ years ago. Occasional alcohol intake on holidays. No drug  use.   Family History: Mother deceased in 29's with history of uterine cancer. Father deceased in his 27's. Sister and brother with DM II.    Allergies  Allergen Reactions  . Acetaminophen Hives and Nausea Only  . Atorvastatin Nausea Only    weakness  . Azithromycin Hives and Nausea And Vomiting  . Zyrtec [Cetirizine]     Hives       Outpatient Encounter Medications as of 03/18/2020  Medication Sig  . amLODipine (NORVASC) 5 MG tablet TAKE 1 TABLET(5 MG) BY MOUTH DAILY  . aspirin 81 MG EC tablet Take 81 mg by mouth daily. Swallow whole.  . fluticasone (FLONASE) 50 MCG/ACT nasal spray Place 1 spray into both nostrils daily as needed for allergies or rhinitis.  Marland Kitchen gabapentin (NEURONTIN) 100 MG capsule Take 100 mg by mouth daily.  Marland Kitchen loratadine (CLARITIN) 10 MG tablet Take 1 tablet (10 mg total) by mouth daily.  . meloxicam (MOBIC) 15 MG tablet TAKE 1/2 TABLET(7.5 MG) BY MOUTH DAILY  . Multiple Vitamins-Minerals (CENTRUM ADULTS PO) Take 1 tablet by mouth daily.   Marland Kitchen olmesartan (BENICAR) 5 MG tablet TAKE 2 TABLETS BY MOUTH DAILY  . Omega-3 1000 MG CAPS Take 1,000 mg by mouth daily.   . ondansetron (ZOFRAN ODT) 4 MG disintegrating tablet Take 1 tablet (4 mg total) by mouth every 8 (eight) hours as needed for nausea or vomiting.  . potassium chloride SA (KLOR-CON) 20 MEQ tablet TK 2 TS PO ONCE D FOR 1 DOSE.  Marland Kitchen triamcinolone cream (KENALOG) 0.5 % APPLY EXTERNALLY TO THE AFFECTED AREA TWICE DAILY AS NEEDED FOR DRY SKIN  . Vitamin D, Ergocalciferol, (DRISDOL) 1.25 MG (50000 UNIT) CAPS capsule Take 1 capsule (50,000 Units total) by mouth every 7 (seven) days.   No facility-administered encounter medications on file as of 03/18/2020.     REVIEW OF SYSTEMS: Gen: + night sweats or chills. No fever. No weight loss.  CV: Denies chest pain, palpitations or edema. Resp: Denies cough, shortness of breath of hemoptysis.  GI: See HPI.  GU : Denies urinary burning, blood in urine, increased urinary  frequency or incontinence. MS: + lower back pain.  Derm: Denies rash, itchiness, skin lesions or unhealing ulcers. Psych: Denies depression, anxiety or memory loss.  Heme: Denies bruising, bleeding. Neuro:  Denies headaches, dizziness or paresthesias. Endo:  + DM II.   PHYSICAL EXAM: There were no vitals taken for this visit.  BP 110/70   Pulse (!) 57   Ht 5' 9.5" (1.765 m)   Wt 170 lb (77.1 kg)   SpO2 97%   BMI 24.74 kg/m   General: Well developed 74 year old female in no acute distress. Head: Normocephalic and atraumatic. Eyes:  Sclerae non-icteric, conjunctive pink. Ears: Normal auditory acuity. Mouth: Dentures intact. Enlarged tonsils without edema/erythema. No ulcers or lesions.  Neck: Supple, no lymphadenopathy or thyromegaly.  Lungs: Clear bilaterally to auscultation without wheezes, crackles or rhonchi. Heart: Regular rate and rhythm. No murmur, rub  or gallop appreciated.  Abdomen: Soft, non distended. Moderate tenderness epigastric, LUQ and throughout the lower abdomen without rebound or guarding. No masses. No hepatosplenomegaly. Normoactive bowel sounds x 4 quadrants. Deferred.  Rectal: Deferred.  Musculoskeletal: Symmetrical with no gross deformities. Skin: Warm and dry. No rash or lesions on visible extremities. Extremities: No edema. Neurological: Alert oriented x 4, no focal deficits.  Psychological:  Alert and cooperative. Normal mood and affect.  ASSESSMENT AND PLAN:  59.  74 year old female with epigastric pain which is progressively worsened over the past 3 months, intermittently radiates to the left upper quadrant.  On exam today she has moderate epigastric, left upper quadrant and lower abdominal tenderness without rebound or guarding. -CBC, CMP and CRP -CTAP with oral and IV contrast asap -Pantoprazole 40 mg once daily. -Famotidine 20 mg 1 p.o. nightly as needed -EGD to be scheduled after the above lab and CTAP results received and reviewed -Patient to  present to the ED if she develops severe abdominal pain -GERD diet for now, push fluids  2.  History of an incomplete colonoscopy by [facility] Dr. Penelope Coop 05/2016 with possible history of colon polyps in 2014 or 2015. -Schedule a colonoscopy and EGD after the above evaluation completed  3.  Nonischemic cardiomyopathy.  LVEF 35 to 40%.  4. DM II      CC:  [name], MD

## 2020-03-18 NOTE — Patient Instructions (Signed)
If you are age 73 or older, your body mass index should be between 23-30. Your Body mass index is 24.74 kg/m. If this is out of the aforementioned range listed, please consider follow up with your Primary Care Provider.  If you are age 77 or younger, your body mass index should be between 19-25. Your Body mass index is 24.74 kg/m. If this is out of the aformentioned range listed, please consider follow up with your Primary Care Provider.   Your provider has requested that you go to the basement level for lab work before leaving today. Press "B" on the elevator. The lab is located at the first door on the left as you exit the elevator.  You have been scheduled for a CT scan of the abdomen and pelvis at [facility], 1st floor Radiology. You are scheduled on 03/19/20  at 7:30 am. You should arrive 15 minutes prior to your appointment time for registration.  Please pick up 2 bottles of contrast from [facility] at least 3 days prior to your scan. The solution may taste better if refrigerated, but do NOT add ice or any other liquid to this solution. Shake well before drinking.   Please follow the written instructions below on the day of your exam:   1) Do not eat anything after 3:30 am (4 hours prior to your test)   2) Drink 1 bottle of contrast @ 5:30 am (2 hours prior to your exam)  Remember to shake well before drinking and do NOT pour over ice.     Drink 1 bottle of contrast @ 6:30 am (1 hour prior to your exam)   You may take any medications as prescribed with a small amount of water, if necessary. If you take any of the following medications: METFORMIN, GLUCOPHAGE, GLUCOVANCE, AVANDAMET, RIOMET, FORTAMET, [place] MET, JANUMET, GLUMETZA or METAGLIP, you MAY be asked to HOLD this medication 48 hours AFTER the exam.   The purpose of you drinking the oral contrast is to aid in the visualization of your intestinal tract. The contrast solution may cause some diarrhea. Depending on your  individual set of symptoms, you may also receive an intravenous injection of x-ray contrast/dye. Plan on being at [facility] for 45 minutes or longer, depending on the type of exam you are having performed.   If you have any questions regarding your exam or if you need to reschedule, you may call [facility] at [phone] between the hours of 8:00 am and 5:00 pm, Monday-Friday.    Follow a GERD Diet and Push Fluids.  If your symptoms worsen PLEASE go the nearest ER.  Follow up pending the results of your labs and CT.

## 2020-03-18 NOTE — Progress Notes (Signed)
I agree with the above note, plan 

## 2020-03-19 ENCOUNTER — Telehealth: Payer: Self-pay | Admitting: Family Medicine

## 2020-03-19 ENCOUNTER — Ambulatory Visit (HOSPITAL_COMMUNITY)
Admission: RE | Admit: 2020-03-19 | Discharge: 2020-03-19 | Disposition: A | Discharge status: Home / Self Care | Payer: Medicare Other | Source: Ambulatory Visit | Attending: Nurse Practitioner | Admitting: Nurse Practitioner

## 2020-03-19 ENCOUNTER — Other Ambulatory Visit: Payer: Self-pay

## 2020-03-19 DIAGNOSIS — R1030 Lower abdominal pain, unspecified: Secondary | ICD-10-CM

## 2020-03-19 DIAGNOSIS — R1012 Left upper quadrant pain: Secondary | ICD-10-CM | POA: Diagnosis not present

## 2020-03-19 DIAGNOSIS — Z8719 Personal history of other diseases of the digestive system: Secondary | ICD-10-CM | POA: Diagnosis not present

## 2020-03-19 DIAGNOSIS — R1013 Epigastric pain: Secondary | ICD-10-CM | POA: Diagnosis not present

## 2020-03-19 DIAGNOSIS — R109 Unspecified abdominal pain: Secondary | ICD-10-CM | POA: Diagnosis not present

## 2020-03-19 MED ORDER — IOHEXOL 300 MG/ML  SOLN
100.0000 mL | Freq: Once | INTRAMUSCULAR | Status: AC | PRN
  Administered 2020-03-19: 100 mL via INTRAVENOUS

## 2020-03-19 NOTE — Telephone Encounter (Signed)
pt need an Rx for potassium chloride renewed  SA (KLOR-CON) 20 MEQ tablet [id number] - [place], [place] - [address] AT [address]  Phone:  [phone] Fax:  [phone]

## 2020-03-21 NOTE — Telephone Encounter (Signed)
Spoke with pt, state that she has been taking Potassium 2 tablets daily, Pt state that this was prescribed by Cardiology but has not seen him in a few years. Pt state that she had labs on 03/18/2020 and her potassium levels are normal, Pt wants advise on if she should continue or stop taking this Rx. Advised to contact cardiology office to get advise, pt state that she has not been to the office for over 3 years and would like Dr Volanda Napoleon advise.

## 2020-03-28 ENCOUNTER — Other Ambulatory Visit: Payer: Self-pay

## 2020-03-28 ENCOUNTER — Encounter: Payer: Self-pay | Admitting: Specialist

## 2020-03-28 ENCOUNTER — Ambulatory Visit: Payer: Medicare Other | Admitting: Specialist

## 2020-03-28 VITALS — BP 172/80 | HR 65 | Ht 69.5 in | Wt 170.0 lb

## 2020-03-28 DIAGNOSIS — M7062 Trochanteric bursitis, left hip: Secondary | ICD-10-CM

## 2020-03-28 MED ORDER — DICLOFENAC SODIUM 1 % EX GEL: 4.0000 g | Freq: Four times a day (QID) | CUTANEOUS | 6 refills | Status: DC

## 2020-03-28 NOTE — Patient Instructions (Signed)
Plan: Knees and hips are suffering from osteoarthritis, only real proven treatments are Weight loss, NSIADs like diclofenac and exercise. Due to stomach issues you should avoid the use of arthritis medication.  Well padded shoes help. Ice the knee that is suffering from osteoarthritis, only real proven treatments are  Ice the knee 2-3 times a day 15-20 mins at a time.-3 times a day 15-20 mins at a time. Hot showers in the AM.  Injection with steroid may be of benefit. Hemp CBD capsules, amazon.com 5,000-7,000 mg per bottle, 60 capsules per bottle, take one capsule twice a day. Cane in the left hand to use with left leg weight bearing. If the hip is painful we recommend using the cane in the opposite have.  I will refer you to my partner Dr. Ninfa Linden for considering a left total hip replacement.  Follow-Up Instructions: No follow-ups on file.

## 2020-03-28 NOTE — Progress Notes (Signed)
Office Visit Note   Patient: Angela Pugh           Date of Birth: 12-13-1945           MRN: [id number] Visit Date: 03/28/2020              Requested by: [name], MD [place],  [place] [address] PCP: [name], MD   Assessment & Plan: Visit Diagnoses:  1. Trochanteric bursitis, left hip     Plan: Knees and hips are suffering from osteoarthritis, only real proven treatments are Weight loss, NSIADs like diclofenac and exercise. Due to stomach issues you should avoid the use of arthritis medication.  Well padded shoes help. Ice the knee that is suffering from osteoarthritis, only real proven treatments are  Ice the knee 2-3 times a day 15-20 mins at a time.-3 times a day 15-20 mins at a time. Hot showers in the AM.  Injection with steroid may be of benefit. Hemp CBD capsules, amazon.com 5,000-7,000 mg per bottle, 60 capsules per bottle, take one capsule twice a day. Cane in the left hand to use with left leg weight bearing. If the hip is painful we recommend using the cane in the opposite have.  I will refer you to my partner Dr. Ninfa Linden for considering a left total hip replacement.  Follow-Up Instructions: No follow-ups on file.   Follow-Up Instructions: No follow-ups on file.   Orders:  No orders of the defined types were placed in this encounter.  No orders of the defined types were placed in this encounter.     Procedures: No procedures performed   Clinical Data: No additional findings.   Subjective: Chief Complaint  Patient presents with  . Left Knee - Follow-up    Had synvisc series, states that the pan is better, but still has swelling and it wants to give out.    74 year old female with history of left knee osteoarthritis and has had synvisc injections which seem to help. She is having stomach upset and is unable to take arthritis medications. She has pain in the left hip and radiation into the left anterolateral  thing and    Review of Systems   Objective: Vital Signs: BP (!) 172/80 (BP Location: Left Arm, Patient Position: Sitting)   Pulse 65   Ht 5' 9.5" (1.765 m)   Wt 170 lb (77.1 kg)   BMI 24.74 kg/m   Physical Exam Constitutional:      Appearance: She is well-developed.  HENT:     Head: Normocephalic and atraumatic.  Eyes:     Pupils: Pupils are equal, round, and reactive to light.  Pulmonary:     Effort: Pulmonary effort is normal.     Breath sounds: Normal breath sounds.  Abdominal:     General: Bowel sounds are normal.     Palpations: Abdomen is soft.  Musculoskeletal:     Cervical back: Normal range of motion and neck supple.  Skin:    General: Skin is warm and dry.  Neurological:     Mental Status: She is alert and oriented to person, place, and time.  Psychiatric:        Behavior: Behavior normal.        Thought Content: Thought content normal.        Judgment: Judgment normal.     Left Hip Exam   Tenderness  The patient is experiencing tenderness in the greater trochanter and lateral.  Range of Motion  Abduction: abnormal  Adduction: abnormal  Extension: normal  Flexion: normal  External rotation: abnormal  Internal rotation: abnormal   Muscle Strength  Abduction: 5/5  Adduction: 5/5  Flexion: 5/5   Tests  FABER: negative Ober: negative  Other  Erythema: absent Scars: absent Sensation: normal Pulse: present      Specialty Comments:  No specialty comments available.  Imaging: No results found.   PMFS History: Patient Active Problem List   Diagnosis Date Noted  . Herniated lumbar intervertebral disc 06/30/2013    Priority: High    Class: Acute  . Abdominal pain, epigastric 03/18/2020  . Nonischemic cardiomyopathy ([facility]) 04/12/2018  . Chest pain 01/25/2018  . Left bundle branch block 01/25/2018  . Carotid artery disease ([place] [facility]) 01/25/2018  . Seasonal allergies 10/18/2017  . Arthritis 10/18/2017  . Pre-diabetes 10/18/2017  .  Protein-calorie malnutrition, severe 05/05/2016  . Dysphagia 05/04/2016  . HNP (herniated nucleus pulposus) with myelopathy, cervical 04/17/2016  . Abdominal pain 02/01/2016  . Acute diverticulitis 02/01/2016  . HLD (hyperlipidemia) 02/01/2016  . Diabetes mellitus without complication ([facility]) 26/83/4196  . Hypokalemia 02/01/2016  . Hypertension   . Essential hypertension   . Cervical spondylosis without myelopathy 01/13/2016  . Bilateral carpal tunnel syndrome 01/13/2016   Past Medical History:  Diagnosis Date  . Arthritis    back, knees, shoulder, hands , injections in pelvis, for bone spurs (05/04/2016)  . Chronic back pain   . Diverticulosis   . History of kidney stones   . HLD (hyperlipidemia)   . Hypertension   . Migraine    hx (05/04/2016)  . Peripheral neuropathy   . Radiculopathy   . Sinus complaint   . Sinus headache    occasional (05/04/2016)  . Type II diabetes mellitus (HCC)    diet controlled, no meds now, was on insulin & po treatment at one time    . Vertigo     Family History  Problem Relation Age of Onset  . Uterine cancer Mother   . Diabetes type II Sister   . Diabetes Mellitus II Brother   . Colon cancer Neg Hx   . Pancreatic cancer Neg Hx   . Esophageal cancer Neg Hx     Past Surgical History:  Procedure Laterality Date  . ANTERIOR CERVICAL DECOMP/DISCECTOMY FUSION N/A 04/17/2016   Procedure: ANTERIOR CERVICAL DECOMPRESSION/DISCECTOMY FUSION CERVICAL THREE - CERVICAL FOUR, POSTERIOR CERVICAL DISCECTOMY CERVICAL SEVEN -THORASIC ONE LEFT;  Surgeon: Ashok Pall, MD;  Location: [facility];  Service: Neurosurgery;  Laterality: N/A;  Anterior/Posterior  . ANTERIOR FUSION CERVICAL SPINE  2000   Dr. Carloyn Manner; "for bone spurs; put hardware in too"  . BACK SURGERY    . LEFT HEART CATH AND CORONARY ANGIOGRAPHY N/A 03/17/2018   Procedure: LEFT HEART CATH AND CORONARY ANGIOGRAPHY;  Surgeon: Lorretta Harp, MD;  Location: [facility];  Service: Cardiovascular;   Laterality: N/A;  . LUMBAR LAMINECTOMY Left 06/30/2013   Procedure: Left L3-4 Extraforaminal approach to excise far lateral herniated nucleus pulposus;  Surgeon: Jessy Oto, MD;  Location: [facility];  Service: Orthopedics;  Laterality: Left;  . TUBAL LIGATION    . VAGINAL HYSTERECTOMY     Social History   Occupational History  . Not on file  Tobacco Use  . Smoking status: Former Smoker    Packs/day: 0.50    Years: 20.00    Pack years: 10.00    Types: Cigarettes    Quit date: 06/30/1982  Years since quitting: 37.7  . Smokeless tobacco: Never Used  Vaping Use  . Vaping Use: Never used  Substance and Sexual Activity  . Alcohol use: Not Currently    Comment: 05/04/2016 "might have a drink a couple days/year; might not"  . Drug use: No  . Sexual activity: Not Currently

## 2020-03-28 NOTE — Addendum Note (Signed)
Addended by: Basil Dess on: 03/28/2020 10:13 AM   Modules accepted: Orders

## 2020-04-03 ENCOUNTER — Other Ambulatory Visit: Payer: Self-pay | Admitting: Family Medicine

## 2020-04-03 ENCOUNTER — Other Ambulatory Visit: Payer: Self-pay

## 2020-04-03 DIAGNOSIS — K8689 Other specified diseases of pancreas: Secondary | ICD-10-CM

## 2020-04-03 DIAGNOSIS — R1013 Epigastric pain: Secondary | ICD-10-CM

## 2020-04-03 MED ORDER — POTASSIUM CHLORIDE CRYS ER 20 MEQ PO TBCR
EXTENDED_RELEASE_TABLET | ORAL | 3 refills | Status: DC
Start: 2020-04-03 — End: 2020-10-04

## 2020-04-03 NOTE — Telephone Encounter (Signed)
Potassium supplement sent to pharmacy.

## 2020-04-04 NOTE — Telephone Encounter (Signed)
Left a detailed message regarding her Potassium Rx sent to her pharmacy

## 2020-04-09 ENCOUNTER — Ambulatory Visit
Admission: RE | Admit: 2020-04-09 | Discharge: 2020-04-09 | Disposition: A | Discharge status: Home / Self Care | Payer: Medicare Other | Source: Ambulatory Visit | Attending: Family Medicine | Admitting: Family Medicine

## 2020-04-09 ENCOUNTER — Other Ambulatory Visit: Payer: Self-pay

## 2020-04-09 DIAGNOSIS — Z1231 Encounter for screening mammogram for malignant neoplasm of breast: Secondary | ICD-10-CM | POA: Diagnosis not present

## 2020-04-09 DIAGNOSIS — Z0000 Encounter for general adult medical examination without abnormal findings: Secondary | ICD-10-CM

## 2020-04-10 ENCOUNTER — Ambulatory Visit: Payer: Self-pay

## 2020-04-10 ENCOUNTER — Other Ambulatory Visit (HOSPITAL_COMMUNITY): Payer: Medicare Other

## 2020-04-10 ENCOUNTER — Ambulatory Visit: Payer: Medicare Other | Admitting: Orthopaedic Surgery

## 2020-04-10 VITALS — Ht 69.5 in | Wt 170.0 lb

## 2020-04-10 DIAGNOSIS — M7062 Trochanteric bursitis, left hip: Secondary | ICD-10-CM

## 2020-04-10 DIAGNOSIS — M25552 Pain in left hip: Secondary | ICD-10-CM

## 2020-04-10 DIAGNOSIS — M1612 Unilateral primary osteoarthritis, left hip: Secondary | ICD-10-CM

## 2020-04-10 MED ORDER — LIDOCAINE HCL 1 % IJ SOLN
3.0000 mL | INTRAMUSCULAR | Status: AC | PRN
  Administered 2020-04-10: 3 mL

## 2020-04-10 MED ORDER — METHYLPREDNISOLONE ACETATE 40 MG/ML IJ SUSP
40.0000 mg | INTRAMUSCULAR | Status: AC | PRN
  Administered 2020-04-10: 40 mg via INTRA_ARTICULAR

## 2020-04-10 NOTE — Progress Notes (Signed)
Office Visit Note   Patient: Angela Pugh           Date of Birth: 07/13/1945           MRN: [id number] Visit Date: 04/10/2020              Requested by: [name], MD [place],   [id number] PCP: [name], MD   Assessment & Plan: Visit Diagnoses:  1. Pain in left hip   2. Unilateral primary osteoarthritis, left hip   3. Trochanteric bursitis, left hip     Plan: She does seem to have some mild to moderate arthritis in both her hips and both her knees but I do feel the most of her pain is trochanteric bursitis and IT band syndrome on the left.  I did recommend a steroid injection over the trochanteric area and she agreed to this and tolerated it well.  I have recommended outpatient physical therapy but she is deferring this until at least we see how the steroid injection does for her left hip.  All questions and concerns were answered and addressed.  I will reevaluate her in 3 weeks.  Follow-Up Instructions: Return in about 3 weeks (around 05/01/2020).   Orders:  Orders Placed This Encounter  Procedures  . Large Joint Inj  . XR HIP UNILAT W OR W/O PELVIS 1V LEFT   No orders of the defined types were placed in this encounter.     Procedures: Large Joint Inj: L greater trochanter on 04/10/2020 9:27 AM Indications: pain and diagnostic evaluation Details: 22 G 1.5 in needle, lateral approach  Arthrogram: No  Medications: 3 mL lidocaine 1 %; 40 mg methylPREDNISolone acetate 40 MG/ML Outcome: tolerated well, no immediate complications Procedure, treatment alternatives, risks and benefits explained, specific risks discussed. Consent was given by the patient. Immediately prior to procedure a time out was called to verify the correct patient, procedure, equipment, support staff and site/side marked as required. Patient was prepped and draped in the usual sterile fashion.       Clinical Data: No additional  findings.   Subjective: Chief Complaint  Patient presents with  . Left Hip - Pain  Patient is a 74 year old patient with Dr. Louanne Skye will need to take a look to evaluate her left hip.  She does report significant left hip pain but she points the lateral aspect of her hip as a source of her pain in the lateral aspect of her knee.  She denies any groin pain.  She says it really hurts to sleep on the left side at night.  She is very active 74 year old.  She is not need to walk with an assistive device.  She used to ride an exercise bike but this did flareup her pain.  She denies any other acute changes in her medical status.  HPI  Review of Systems She currently denies any headache, chest pain, shortness of breath, fever, chills, nausea, vomiting  Objective: Vital Signs: Ht 5' 9.5" (1.765 m)   Wt 170 lb (77.1 kg)   BMI 24.74 kg/m   Physical Exam She is alert and orient x3 and in no acute distress Ortho Exam Examination of her left hip shows severe pain to barely touching over the trochanteric area of her hip.  I can easily put her left hip through internal and external rotation with no blocks to rotation and no pain in the groin and just pain on the lateral aspect of her  hip. Specialty Comments:  No specialty comments available.  Imaging: XR HIP UNILAT W OR W/O PELVIS 1V LEFT  Result Date: 04/10/2020 An AP pelvis and lateral left hip does show mild to moderate 30 changes with slight joint space narrowing and some particular osteophytes.    PMFS History: Patient Active Problem List   Diagnosis Date Noted  . Abdominal pain, epigastric 03/18/2020  . Nonischemic cardiomyopathy ([facility]) 04/12/2018  . Chest pain 01/25/2018  . Left bundle branch block 01/25/2018  . Carotid artery disease ([facility]) 01/25/2018  . Seasonal allergies 10/18/2017  . Arthritis 10/18/2017  . Pre-diabetes 10/18/2017  . Protein-calorie malnutrition, severe 05/05/2016  . Dysphagia 05/04/2016  . HNP (herniated  nucleus pulposus) with myelopathy, cervical 04/17/2016  . Abdominal pain 02/01/2016  . Acute diverticulitis 02/01/2016  . HLD (hyperlipidemia) 02/01/2016  . Diabetes mellitus without complication ([facility]) 64/33/2951  . Hypokalemia 02/01/2016  . Hypertension   . Essential hypertension   . Cervical spondylosis without myelopathy 01/13/2016  . Bilateral carpal tunnel syndrome 01/13/2016  . Herniated lumbar intervertebral disc 06/30/2013    Class: Acute   Past Medical History:  Diagnosis Date  . Arthritis    back, knees, shoulder, hands , injections in pelvis, for bone spurs (05/04/2016)  . Chronic back pain   . Diverticulosis   . History of kidney stones   . HLD (hyperlipidemia)   . Hypertension   . Migraine    hx (05/04/2016)  . Peripheral neuropathy   . Radiculopathy   . Sinus complaint   . Sinus headache    occasional (05/04/2016)  . Type II diabetes mellitus (HCC)    diet controlled, no meds now, was on insulin & po treatment at one time    . Vertigo     Family History  Problem Relation Age of Onset  . Uterine cancer Mother   . Diabetes type II Sister   . Diabetes Mellitus II Brother   . Colon cancer Neg Hx   . Pancreatic cancer Neg Hx   . Esophageal cancer Neg Hx     Past Surgical History:  Procedure Laterality Date  . ANTERIOR CERVICAL DECOMP/DISCECTOMY FUSION N/A 04/17/2016   Procedure: ANTERIOR CERVICAL DECOMPRESSION/DISCECTOMY FUSION CERVICAL THREE - CERVICAL FOUR, POSTERIOR CERVICAL DISCECTOMY CERVICAL SEVEN -THORASIC ONE LEFT;  Surgeon: Ashok Pall, MD;  Location: [facility];  Service: Neurosurgery;  Laterality: N/A;  Anterior/Posterior  . ANTERIOR FUSION CERVICAL SPINE  2000   Dr. Carloyn Manner; "for bone spurs; put hardware in too"  . BACK SURGERY    . LEFT HEART CATH AND CORONARY ANGIOGRAPHY N/A 03/17/2018   Procedure: LEFT HEART CATH AND CORONARY ANGIOGRAPHY;  Surgeon: Lorretta Harp, MD;  Location: [facility];  Service: Cardiovascular;  Laterality: N/A;  .  LUMBAR LAMINECTOMY Left 06/30/2013   Procedure: Left L3-4 Extraforaminal approach to excise far lateral herniated nucleus pulposus;  Surgeon: Jessy Oto, MD;  Location: [facility];  Service: Orthopedics;  Laterality: Left;  . TUBAL LIGATION    . VAGINAL HYSTERECTOMY     Social History   Occupational History  . Not on file  Tobacco Use  . Smoking status: Former Smoker    Packs/day: 0.50    Years: 20.00    Pack years: 10.00    Types: Cigarettes    Quit date: 06/30/1982    Years since quitting: 37.8  . Smokeless tobacco: Never Used  Vaping Use  . Vaping Use: Never used  Substance and Sexual Activity  . Alcohol use: Not Currently  Comment: 05/04/2016 "might have a drink a couple days/year; might not"  . Drug use: No  . Sexual activity: Not Currently

## 2020-04-12 ENCOUNTER — Other Ambulatory Visit: Payer: Self-pay | Admitting: Nurse Practitioner

## 2020-04-12 ENCOUNTER — Ambulatory Visit (HOSPITAL_COMMUNITY)
Admission: RE | Admit: 2020-04-12 | Discharge: 2020-04-12 | Disposition: A | Discharge status: Home / Self Care | Payer: Medicare Other | Source: Ambulatory Visit | Attending: Nurse Practitioner | Admitting: Nurse Practitioner

## 2020-04-12 DIAGNOSIS — K5730 Diverticulosis of large intestine without perforation or abscess without bleeding: Secondary | ICD-10-CM | POA: Diagnosis not present

## 2020-04-12 DIAGNOSIS — K8689 Other specified diseases of pancreas: Secondary | ICD-10-CM | POA: Diagnosis not present

## 2020-04-12 DIAGNOSIS — R1013 Epigastric pain: Secondary | ICD-10-CM

## 2020-04-12 DIAGNOSIS — K7689 Other specified diseases of liver: Secondary | ICD-10-CM | POA: Diagnosis not present

## 2020-04-12 MED ORDER — GADOBUTROL 1 MMOL/ML IV SOLN
8.0000 mL | Freq: Once | INTRAVENOUS | Status: AC | PRN
  Administered 2020-04-12: 8 mL via INTRAVENOUS

## 2020-04-13 ENCOUNTER — Other Ambulatory Visit: Payer: Self-pay | Admitting: Cardiovascular Disease

## 2020-04-14 ENCOUNTER — Other Ambulatory Visit: Payer: Self-pay | Admitting: Family Medicine

## 2020-04-14 ENCOUNTER — Other Ambulatory Visit: Payer: Self-pay | Admitting: Specialist

## 2020-04-14 DIAGNOSIS — E559 Vitamin D deficiency, unspecified: Secondary | ICD-10-CM

## 2020-04-17 ENCOUNTER — Ambulatory Visit (AMBULATORY_SURGERY_CENTER): Payer: Self-pay | Admitting: *Deleted

## 2020-04-17 ENCOUNTER — Encounter: Payer: Self-pay | Admitting: Gastroenterology

## 2020-04-17 ENCOUNTER — Other Ambulatory Visit: Payer: Self-pay

## 2020-04-17 VITALS — Ht 69.5 in | Wt 170.0 lb

## 2020-04-17 DIAGNOSIS — K8689 Other specified diseases of pancreas: Secondary | ICD-10-CM

## 2020-04-17 DIAGNOSIS — R1012 Left upper quadrant pain: Secondary | ICD-10-CM

## 2020-04-17 DIAGNOSIS — R1013 Epigastric pain: Secondary | ICD-10-CM

## 2020-04-17 DIAGNOSIS — R1030 Lower abdominal pain, unspecified: Secondary | ICD-10-CM

## 2020-04-17 MED ORDER — PEG 3350-KCL-NA BICARB-NACL 420 G PO SOLR: 4000.0000 mL | Freq: Once | ORAL | 0 refills | Status: AC

## 2020-04-17 NOTE — Progress Notes (Signed)
Patient is here in-person for PV. Patient denies any allergies to eggs or soy. Patient denies any problems with anesthesia/sedation. Patient denies any oxygen use at home. Patient denies taking any diet/weight loss medications or blood thinners. Patient is not being treated for MRSA or C-diff. Patient is aware of our care-partner policy and EQAST-41 safety protocol.   COVID-19 vaccines completed on 02/27/20 booster, per patient.

## 2020-04-25 ENCOUNTER — Other Ambulatory Visit: Payer: Self-pay | Admitting: Specialist

## 2020-04-26 ENCOUNTER — Other Ambulatory Visit: Payer: Self-pay

## 2020-04-26 ENCOUNTER — Ambulatory Visit (AMBULATORY_SURGERY_CENTER): Payer: Medicare Other | Admitting: Gastroenterology

## 2020-04-26 ENCOUNTER — Encounter: Payer: Self-pay | Admitting: Gastroenterology

## 2020-04-26 VITALS — BP 141/68 | HR 52 | Temp 97.5°F | Resp 22 | Ht 69.5 in | Wt 170.0 lb

## 2020-04-26 DIAGNOSIS — K319 Disease of stomach and duodenum, unspecified: Secondary | ICD-10-CM

## 2020-04-26 DIAGNOSIS — R1013 Epigastric pain: Secondary | ICD-10-CM

## 2020-04-26 DIAGNOSIS — K2970 Gastritis, unspecified, without bleeding: Secondary | ICD-10-CM

## 2020-04-26 DIAGNOSIS — K2950 Unspecified chronic gastritis without bleeding: Secondary | ICD-10-CM | POA: Diagnosis not present

## 2020-04-26 DIAGNOSIS — R1084 Generalized abdominal pain: Secondary | ICD-10-CM

## 2020-04-26 DIAGNOSIS — K8689 Other specified diseases of pancreas: Secondary | ICD-10-CM

## 2020-04-26 DIAGNOSIS — Z1211 Encounter for screening for malignant neoplasm of colon: Secondary | ICD-10-CM | POA: Diagnosis not present

## 2020-04-26 DIAGNOSIS — K5730 Diverticulosis of large intestine without perforation or abscess without bleeding: Secondary | ICD-10-CM

## 2020-04-26 MED ORDER — MESALAMINE 1.2 G PO TBEC: 4.8000 g | DELAYED_RELEASE_TABLET | Freq: Every day | ORAL | 3 refills | Status: DC

## 2020-04-26 MED ORDER — SODIUM CHLORIDE 0.9 % IV SOLN: 500.0000 mL | Freq: Once | INTRAVENOUS | Status: DC

## 2020-04-26 NOTE — Progress Notes (Signed)
Report given to PACU, vss 

## 2020-04-26 NOTE — Op Note (Signed)
[facility] Patient Name: Angela Pugh Procedure Date: 04/26/2020 1:17 PM MRN: [id number] Endoscopist: Milus Banister , MD Age: 74 Referring MD:  Date of Birth: 1945/07/21 Gender: Female Account #: [id number] Procedure:                Colonoscopy Indications:              Generalized abdominal pain; incomplete colonoscopy                            Dr. Acquanetta Sit 2018 (to the splenic flexure, aborted                            due to gagging?) Medicines:                Monitored Anesthesia Care Procedure:                Pre-Anesthesia Assessment:                           - Prior to the procedure, a History and Physical                            was performed, and patient medications and                            allergies were reviewed. The patient's tolerance of                            previous anesthesia was also reviewed. The risks                            and benefits of the procedure and the sedation                            options and risks were discussed with the patient.                            All questions were answered, and informed consent                            was obtained. Prior Anticoagulants: The patient has                            taken no previous anticoagulant or antiplatelet                            agents. ASA Grade Assessment: III - A patient with                            severe systemic disease. After reviewing the risks                            and benefits, the patient was deemed in  satisfactory condition to undergo the procedure.                           After obtaining informed consent, the colonoscope                            was passed under direct vision. Throughout the                            procedure, the patient's blood pressure, pulse, and                            oxygen saturations were monitored continuously. The                            Colonoscope was introduced through  the anus and                            advanced to the the cecum, identified by                            appendiceal orifice and ileocecal valve. The                            colonoscopy was performed without difficulty. The                            patient tolerated the procedure well. The quality                            of the bowel preparation was good. The ileocecal                            valve, appendiceal orifice, and rectum were                            photographed. Scope In: 1:31:09 PM Scope Out: 1:43:01 PM Scope Withdrawal Time: 0 hours 5 minutes 58 seconds  Total Procedure Duration: 0 hours 11 minutes 52 seconds  Findings:                 Multiple small and large-mouthed diverticula were                            found in the sigmoid colon. This segment was                            associated with luminal narrowing, significant                            spasticity and erythema.                           The exam was otherwise without abnormality on  direct and views. Complications:            No immediate complications. Estimated blood loss:                            None. Estimated Blood Loss:     Estimated blood loss: none. Impression:               - Signficant chronic diverticular changes (luminal                            narrowing, erythema and spasticity). I suspect this                            is the cause of her abdominal pains.                           - The examination was otherwise normal on direct                            and retroflexion views.                           - No polyps or cancers. Recommendation:           - EGD now. Milus Banister, MD 04/26/2020 1:53:22 PM This report has been signed electronically.

## 2020-04-26 NOTE — Progress Notes (Signed)
1326 Robinul 0.1 mg IV given due large amount of secretions upon assessment.  MD made aware, vss 

## 2020-04-26 NOTE — Progress Notes (Signed)
Medical history reviewed with no changes noted. VS assessed by C.W 

## 2020-04-26 NOTE — Patient Instructions (Signed)
Handouts provided on gastritis and diverticulosis.  Continue present medications. New start of Lialda, called in today; 1.2gm, 4 pills once daily. This is to treat your abdominal pains that I think are related to chroni diverticulosis.   Follow up office visit with Dr. Ardis Hughs in 2 months.   YOU HAD AN ENDOSCOPIC PROCEDURE TODAY AT [facility]:   Refer to the procedure report that was given to you for any specific questions about what was found during the examination.  If the procedure report does not answer your questions, please call your gastroenterologist to clarify.  If you requested that your care partner not be given the details of your procedure findings, then the procedure report has been included in a sealed envelope for you to review at your convenience later.  YOU SHOULD EXPECT: Some feelings of bloating in the abdomen. Passage of more gas than usual.  Walking can help get rid of the air that was put into your GI tract during the procedure and reduce the bloating. If you had a lower endoscopy (such as a colonoscopy or flexible sigmoidoscopy) you may notice spotting of blood in your stool or on the toilet paper. If you underwent a bowel prep for your procedure, you may not have a normal bowel movement for a few days.  Please Note:  You might notice some irritation and congestion in your nose or some drainage.  This is from the oxygen used during your procedure.  There is no need for concern and it should clear up in a day or so.  SYMPTOMS TO REPORT IMMEDIATELY:   Following lower endoscopy (colonoscopy or flexible sigmoidoscopy):  Excessive amounts of blood in the stool  Significant tenderness or worsening of abdominal pains  Swelling of the abdomen that is new, acute  Fever of 100F or higher   Following upper endoscopy (EGD)  Vomiting of blood or coffee ground material  New chest pain or pain under the shoulder blades  Painful or persistently difficult  swallowing  New shortness of breath  Fever of 100F or higher  Black, tarry-looking stools  For urgent or emergent issues, a gastroenterologist can be reached at any hour by calling [phone]. Do not use MyChart messaging for urgent concerns.    DIET:  We do recommend a small meal at first, but then you may proceed to your regular diet.  Drink plenty of fluids but you should avoid alcoholic beverages for 24 hours.  ACTIVITY:  You should plan to take it easy for the rest of today and you should NOT DRIVE or use heavy machinery until tomorrow (because of the sedation medicines used during the test).    FOLLOW UP: Our staff will call the number listed on your records 48-72 hours following your procedure to check on you and address any questions or concerns that you may have regarding the information given to you following your procedure. If we do not reach you, we will leave a message.  We will attempt to reach you two times.  During this call, we will ask if you have developed any symptoms of COVID 19. If you develop any symptoms (ie: fever, flu-like symptoms, shortness of breath, cough etc.) before then, please call [phone].  If you test positive for Covid 19 in the 2 weeks post procedure, please call and report this information to [place].    If any biopsies were taken you will be contacted by phone or by letter within the next 1-3 weeks.  Please call us at [phone] if you have not heard about the biopsies in 3 weeks.    SIGNATURES/CONFIDENTIALITY: You and/or your care partner have signed paperwork which will be entered into your electronic medical record.  These signatures attest to the fact that that the information above on your After Visit Summary has been reviewed and is understood.  Full responsibility of the confidentiality of this discharge information lies with you and/or your care-partner.

## 2020-04-26 NOTE — Progress Notes (Signed)
Robinul 0.1 mg IV given due large amount of secretions upon assessment.  Patient experiencing nausea and vomiting.  MD updated and Zofran 4 mg IV given, vss

## 2020-04-26 NOTE — Op Note (Signed)
[place] Patient Name: Angela Pugh Procedure Date: 04/26/2020 1:16 PM MRN: [id number] Endoscopist: Milus Banister , MD Age: 74 Referring MD:  Date of Birth: 1946/05/06 Gender: Female Account #: [id number] Procedure:                Upper GI endoscopy Indications:              Generalized abdominal pain Medicines:                Monitored Anesthesia Care Procedure:                Pre-Anesthesia Assessment:                           - Prior to the procedure, a History and Physical                            was performed, and patient medications and                            allergies were reviewed. The patient's tolerance of                            previous anesthesia was also reviewed. The risks                            and benefits of the procedure and the sedation                            options and risks were discussed with the patient.                            All questions were answered, and informed consent                            was obtained. Prior Anticoagulants: The patient has                            taken no previous anticoagulant or antiplatelet                            agents. ASA Grade Assessment: III - A patient with                            severe systemic disease. After reviewing the risks                            and benefits, the patient was deemed in                            satisfactory condition to undergo the procedure.                           After obtaining informed consent, the endoscope was  passed under direct vision. Throughout the                            procedure, the patient's blood pressure, pulse, and                            oxygen saturations were monitored continuously. The                            Endoscope was introduced through the mouth, and                            advanced to the second part of duodenum. The upper                            GI endoscopy was  accomplished without difficulty.                            The patient tolerated the procedure well. Scope In: Scope Out: Findings:                 5-72mm "blue bleb" type vascular finding in the                            distal esophagus, just proximal to normal GE                            junction.                           Mild inflammation characterized by erythema was                            found in the gastric antrum. Biopsies were taken                            with a cold forceps for histology.                           The exam was otherwise without abnormality. Complications:            No immediate complications. Estimated blood loss:                            None. Estimated Blood Loss:     Estimated blood loss: none. Impression:               - Mild, non-specific distal gastritis, biopsied to                            check for H. pylori.                           - Small 'blue bleb' vascular finding in the distal  esophagus of doubtful clinical importance.                           - The examination was otherwise normal. Recommendation:           - Patient has a contact number available for                            emergencies. The signs and symptoms of potential                            delayed complications were discussed with the                            patient. Return to normal activities tomorrow.                            Written discharge instructions were provided to the                            patient.                           - Resume previous diet.                           - Continue present medications. New start of                            lialda, called in today; 1.2gm pills, 4 pills once                            daily, disp 3 months with 3 refills. This is to                            treat you abdominal pains that I think are related                            to chronic diverticulosis.                            - Follow up OV with Dr. Ardis Hughs in 2 months.                           - Await pathology results.                           - No recall colonoscopy or EGD needed. Milus Banister, MD 04/26/2020 1:58:38 PM This report has been signed electronically.

## 2020-04-30 ENCOUNTER — Telehealth: Payer: Self-pay | Admitting: *Deleted

## 2020-04-30 NOTE — Telephone Encounter (Signed)
°  Follow up Call-  Call back number 04/26/2020  Post procedure Call Back phone  # [phone]  Permission to leave phone message Yes  Some recent data might be hidden     Patient questions:  Do you have a fever, pain , or abdominal swelling? No. Pain Score  0 *  Have you tolerated food without any problems? Yes.    Have you been able to return to your normal activities? Yes.    Do you have any questions about your discharge instructions: Diet   No. Medications  No. Follow up visit  No.  Do you have questions or concerns about your Care? No.  Actions: * If pain score is 4 or above: No action needed, pain <4.  1. Have you developed a fever since your procedure? no  2.   Have you had an respiratory symptoms (SOB or cough) since your procedure? no  3.   Have you tested positive for COVID 19 since your procedure no  4.   Have you had any family members/close contacts diagnosed with the COVID 19 since your procedure?  no   If yes to any of these questions please route to Joylene John, RN and Joella Prince, RN

## 2020-05-01 ENCOUNTER — Ambulatory Visit: Payer: Medicare Other | Admitting: Orthopaedic Surgery

## 2020-05-01 ENCOUNTER — Ambulatory Visit: Payer: Medicare Other | Admitting: Podiatry

## 2020-05-01 ENCOUNTER — Encounter: Payer: Self-pay | Admitting: Podiatry

## 2020-05-01 ENCOUNTER — Other Ambulatory Visit: Payer: Self-pay

## 2020-05-01 DIAGNOSIS — Q828 Other specified congenital malformations of skin: Secondary | ICD-10-CM | POA: Diagnosis not present

## 2020-05-01 DIAGNOSIS — B351 Tinea unguium: Secondary | ICD-10-CM

## 2020-05-01 DIAGNOSIS — M79675 Pain in left toe(s): Secondary | ICD-10-CM | POA: Diagnosis not present

## 2020-05-01 DIAGNOSIS — M79674 Pain in right toe(s): Secondary | ICD-10-CM | POA: Diagnosis not present

## 2020-05-01 DIAGNOSIS — E1151 Type 2 diabetes mellitus with diabetic peripheral angiopathy without gangrene: Secondary | ICD-10-CM

## 2020-05-01 DIAGNOSIS — M2041 Other hammer toe(s) (acquired), right foot: Secondary | ICD-10-CM

## 2020-05-01 DIAGNOSIS — M2042 Other hammer toe(s) (acquired), left foot: Secondary | ICD-10-CM

## 2020-05-01 NOTE — Progress Notes (Signed)
Subjective:  Patient ID: Angela Pugh, female    DOB: 1945-06-18,  MRN: [id number]  74 y.o. female presents with preventative diabetic foot care and painful porokeratotic lesion(s) b/l feet and painful mycotic toenails that limit ambulation. Aggravating factors include weightbearing with and without shoe gear. Pain for both is relieved with periodic professional debridement.   Patient states her left great toe porokeratosis is most painful on today's visit. She states she refrained from using the topical wart remover as instructed by me on last visit.  She also states she gets some sharp shooting pain across her great toes on occasion.  She has left hip problems she sees ortho for.  PCP is Dr. Grier Mitts and last visit was 01/12/2020.  Review of Systems: Negative except as noted in the HPI. Denies N/V/F/Ch. Past Medical History:  Diagnosis Date  . Arthritis    back, knees, shoulder, hands , injections in pelvis, for bone spurs (05/04/2016)  . Chronic back pain   . Diverticulosis   . History of kidney stones   . HLD (hyperlipidemia)   . Hypertension   . Migraine    hx (05/04/2016)  . Peripheral neuropathy   . Radiculopathy   . Sinus complaint   . Sinus headache    occasional (05/04/2016)  . Type II diabetes mellitus (HCC)    diet controlled, no meds now, was on insulin & po treatment at one time    . Vertigo    Past Surgical History:  Procedure Laterality Date  . ANTERIOR CERVICAL DECOMP/DISCECTOMY FUSION N/A 04/17/2016   Procedure: ANTERIOR CERVICAL DECOMPRESSION/DISCECTOMY FUSION CERVICAL THREE - CERVICAL FOUR, POSTERIOR CERVICAL DISCECTOMY CERVICAL SEVEN -THORASIC ONE LEFT;  Surgeon: Ashok Pall, MD;  Location: [facility];  Service: Neurosurgery;  Laterality: N/A;  Anterior/Posterior  . ANTERIOR FUSION CERVICAL SPINE  2000   Dr. Carloyn Manner; "for bone spurs; put hardware in too"  . BACK SURGERY    . COLONOSCOPY  06/10/2016   [facility]  . LEFT HEART CATH AND CORONARY ANGIOGRAPHY  N/A 03/17/2018   Procedure: LEFT HEART CATH AND CORONARY ANGIOGRAPHY;  Surgeon: Lorretta Harp, MD;  Location: [facility];  Service: Cardiovascular;  Laterality: N/A;  . LUMBAR LAMINECTOMY Left 06/30/2013   Procedure: Left L3-4 Extraforaminal approach to excise far lateral herniated nucleus pulposus;  Surgeon: Jessy Oto, MD;  Location: [facility];  Service: Orthopedics;  Laterality: Left;  . TUBAL LIGATION    . VAGINAL HYSTERECTOMY     Patient Active Problem List   Diagnosis Date Noted  . Abdominal pain, epigastric 03/18/2020  . Nonischemic cardiomyopathy ([place]) 04/12/2018  . Chest pain 01/25/2018  . Left bundle branch block 01/25/2018  . Carotid artery disease ([place]) 01/25/2018  . Seasonal allergies 10/18/2017  . Arthritis 10/18/2017  . Pre-diabetes 10/18/2017  . Protein-calorie malnutrition, severe 05/05/2016  . Dysphagia 05/04/2016  . HNP (herniated nucleus pulposus) with myelopathy, cervical 04/17/2016  . Abdominal pain 02/01/2016  . Acute diverticulitis 02/01/2016  . HLD (hyperlipidemia) 02/01/2016  . Diabetes mellitus without complication ([place]) 34/19/6222  . Hypokalemia 02/01/2016  . Hypertension   . Cervical spondylosis without myelopathy 01/13/2016  . Bilateral carpal tunnel syndrome 01/13/2016  . Herniated lumbar intervertebral disc 06/30/2013    Class: Acute    Current Outpatient Medications:  .  amLODipine (NORVASC) 5 MG tablet, TAKE 1 TABLET(5 MG) BY MOUTH DAILY, Disp: 15 tablet, Rfl: 0 .  aspirin 81 MG EC tablet, Take 81 mg by mouth daily. Swallow whole., Disp: , Rfl:  .  diclofenac Sodium (VOLTAREN) 1 % GEL, Apply 4 g topically 4 (four) times daily., Disp: 350 g, Rfl: 6 .  famotidine (PEPCID) 20 MG tablet, Take 1 tablet (20 mg total) by mouth 3 times/day as needed-between meals & bedtime for heartburn or indigestion., Disp: 30 tablet, Rfl: 0 .  fluticasone (FLONASE) 50 MCG/ACT nasal spray, Place 1 spray into both nostrils daily as needed for allergies or  rhinitis., Disp: , Rfl:  .  gabapentin (NEURONTIN) 100 MG capsule, TAKE 1 CAPSULE(100 MG) BY MOUTH AT BEDTIME, Disp: 30 capsule, Rfl: 3 .  Loratadine-Pseudoephedrine (CLARITIN-D 24 HOUR PO), Take by mouth., Disp: , Rfl:  .  meloxicam (MOBIC) 15 MG tablet, TAKE 1/2 TABLET(7.5 MG) BY MOUTH DAILY, Disp: 45 tablet, Rfl: 0 .  mesalamine (LIALDA) 1.2 g EC tablet, Take 4 tablets (4.8 g total) by mouth daily with breakfast., Disp: 360 tablet, Rfl: 3 .  Multiple Vitamins-Minerals (CENTRUM ADULTS PO), Take 1 tablet by mouth daily. , Disp: , Rfl:  .  olmesartan (BENICAR) 5 MG tablet, TAKE 2 TABLETS BY MOUTH DAILY, Disp: 180 tablet, Rfl: 1 .  Omega-3 1000 MG CAPS, Take 1,000 mg by mouth daily. , Disp: , Rfl:  .  pantoprazole (PROTONIX) 40 MG tablet, TAKE 1 TABLET BY MOUTH 30 MINUTES BEFORE BREAKFAST, Disp: 90 tablet, Rfl: 0 .  potassium chloride SA (KLOR-CON) 20 MEQ tablet, Take 1 tab daily., Disp: 90 tablet, Rfl: 3 .  triamcinolone cream (KENALOG) 0.5 %, APPLY EXTERNALLY TO THE AFFECTED AREA TWICE DAILY AS NEEDED FOR DRY SKIN, Disp: 30 g, Rfl: 3 Allergies  Allergen Reactions  . Acetaminophen Hives and Nausea Only  . Atorvastatin Nausea Only    weakness  . Azithromycin Hives and Nausea And Vomiting  . Zyrtec [Cetirizine]     Hives    Social History   Tobacco Use  Smoking Status Former Smoker  . Packs/day: 0.50  . Years: 20.00  . Pack years: 10.00  . Types: Cigarettes  . Quit date: 06/30/1982  . Years since quitting: 37.8  Smokeless Tobacco Never Used   Objective:  There were no vitals filed for this visit. Constitutional Patient is a pleasant 74 y.o. African American female, WD, WN in NAD.Marland Kitchen  Vascular Neurovascular status unchanged b/l lower extremities. Capillary refill time to digits immediate b/l. Palpable DP pulse(s) b/l lower extremities Nonpalpable PT pulse(s) b/l lower extremities. Pedal hair sparse. Lower extremity skin temperature gradient within normal limits. No pain with calf  compression b/l. No edema noted b/l lower extremities. No cyanosis or clubbing noted.  Neurologic Normal speech. Protective sensation intact 5/5 intact bilaterally with 10g monofilament b/l. Vibratory sensation intact b/l. Proprioception intact bilaterally. Clonus negative b/l.  Dermatologic Pedal skin with normal turgor, texture and tone bilaterally. No open wounds bilaterally. No interdigital macerations bilaterally. Toenails 1-5 b/l elongated, discolored, dystrophic, thickened, crumbly with subungual debris and tenderness to dorsal palpation. Porokeratotic lesion(s) L hallux, R hallux, submet head 2 left foot, submet head 2 right foot. No erythema, no edema, no drainage, no flocculence.  Orthopedic: Normal muscle strength 5/5 to all lower extremity muscle groups bilaterally. No pain crepitus or joint limitation noted with ROM b/l. Hammertoes noted to the L 5th toe and R 5th toe.   Radiographs: None Assessment:   1. Pain due to onychomycosis of toenails of both feet   2. Porokeratosis   3. Acquired hammertoes of both feet   4. Type II diabetes mellitus with peripheral circulatory disorder [facility])    Plan:  Patient  was evaluated and treated and all questions answered.  Onychomycosis with pain -Nails palliatively debridement as below. -Educated on self-care  Procedure: Nail Debridement Rationale: Pain Type of Debridement: manual, sharp debridement. Instrumentation: Nail nipper, rotary burr. Number of Nails: 10  -Examined patient. -No new findings. No new orders. -Continue diabetic foot care principles. -Regarding great toe pain, we did discuss signs/symptoms of neuropathy and radiculopathy. Monitor for now. -Toenails 1-5 b/l were debrided in length and girth with sterile nail nippers and dremel without iatrogenic bleeding.  -Painful porokeratotic lesion(s) submet head 2 left foot, submet head 2 right foot, b/l hallux pared and enucleated with sterile scalpel blade and sterile #5 currette  without incident.  -Continue diabetic shoe gear daily. -Patient/POA to call should there be any questions/concerns in the interim.  Return in about 3 months (around 07/30/2020) for diabetic toenails, corn(s)/callus(es).  Marzetta Board, DPM

## 2020-05-02 ENCOUNTER — Ambulatory Visit: Payer: Medicare Other | Admitting: Orthopaedic Surgery

## 2020-05-02 ENCOUNTER — Encounter: Payer: Self-pay | Admitting: Orthopaedic Surgery

## 2020-05-02 DIAGNOSIS — M1612 Unilateral primary osteoarthritis, left hip: Secondary | ICD-10-CM

## 2020-05-02 DIAGNOSIS — M7062 Trochanteric bursitis, left hip: Secondary | ICD-10-CM

## 2020-05-02 DIAGNOSIS — M25552 Pain in left hip: Secondary | ICD-10-CM

## 2020-05-02 NOTE — Progress Notes (Signed)
The patient comes in for continued follow-up for left hip pain.  It has been more over the trochanteric area and not so much in the groin.  I did place a steroid injection over the trochanteric area and she states that it is not as sore as it was before.  She is an active 74 year old female.  This flared up after having to lay on her side to have an upper endoscopy as well as colonoscopy.  I can put her hip through internal and external rotation with only some mild pain.  Her hip flexion is weak and painful and will stiff.  She has pain still over the trochanteric area but it is not as severe.  I did offer her outpatient physical therapy but she defers this.  She states she will just see how she does on her own.  I told her we could always repeat an injection in 2 months from now if she needs it.  All questions and concerns were answered and addressed.  Follow-up can be as needed.

## 2020-05-03 ENCOUNTER — Encounter: Payer: Self-pay | Admitting: Gastroenterology

## 2020-07-11 ENCOUNTER — Other Ambulatory Visit: Payer: Self-pay | Admitting: Nurse Practitioner

## 2020-07-12 ENCOUNTER — Encounter: Payer: Self-pay | Admitting: Family Medicine

## 2020-07-12 ENCOUNTER — Telehealth (INDEPENDENT_AMBULATORY_CARE_PROVIDER_SITE_OTHER): Payer: Medicare Other | Admitting: Family Medicine

## 2020-07-12 DIAGNOSIS — G453 Amaurosis fugax: Secondary | ICD-10-CM | POA: Diagnosis not present

## 2020-07-12 DIAGNOSIS — H6983 Other specified disorders of Eustachian tube, bilateral: Secondary | ICD-10-CM

## 2020-07-12 DIAGNOSIS — D2330 Other benign neoplasm of skin of unspecified part of face: Secondary | ICD-10-CM | POA: Diagnosis not present

## 2020-07-12 DIAGNOSIS — R630 Anorexia: Secondary | ICD-10-CM

## 2020-07-12 DIAGNOSIS — R200 Anesthesia of skin: Secondary | ICD-10-CM

## 2020-07-12 DIAGNOSIS — J0140 Acute pansinusitis, unspecified: Secondary | ICD-10-CM

## 2020-07-12 DIAGNOSIS — H40013 Open angle with borderline findings, low risk, bilateral: Secondary | ICD-10-CM | POA: Diagnosis not present

## 2020-07-12 DIAGNOSIS — R202 Paresthesia of skin: Secondary | ICD-10-CM

## 2020-07-12 MED ORDER — AMOXICILLIN-POT CLAVULANATE 500-125 MG PO TABS: 1.0000 | ORAL_TABLET | Freq: Two times a day (BID) | ORAL | 0 refills | Status: AC

## 2020-07-12 NOTE — Progress Notes (Signed)
.  Virtual Visit via Telephone Note  I connected with Angela Pugh on 07/12/20 at  4:00 PM EST by telephone and verified that I am speaking with the correct person using two identifiers.   I discussed the limitations, risks, security and privacy concerns of performing an evaluation and management service by telephone and the availability of in person appointments. I also discussed with the patient that there may be a patient responsible charge related to this service. The patient expressed understanding and agreed to proceed.  Location patient: home Location provider: work or home office Participants present for the call: patient, provider Patient did not have a visit in the prior 7 days to address this/these issue(s).   History of Present Illness: Patient is a 75 year old female with past medical history significant for CAD, HTN, LBBB, migraines, nonischemic cardiomyopathy, diverticulosis/diverticulitis, dysphagia, DM 2, peripheral neuropathy, HLD, bilateral carpal tunnel syndrome, seasonal allergies, chronic back pain who was seen for ongoing concern.  Pt thinks she is getting neuropathy in fingers and toes.  Also with decreased energy and SOB with walking short distances.  Pt has no appetite.  Feels like her nose is clogged and drainage x 1 month.  Having pain from ear into neck.  Pt seen by Dr. Kathlen Mody at [facility].  Pt states they are trying to "save her eye".  Pt notes vision in L eye will go "black" for a few seconds.  Had labs done this am.  Has imaging scheduled.   Observations/Objective: Patient sounds cheerful and well on the phone. I do not appreciate any SOB. Speech and thought processing are grossly intact. Patient reported vitals:  Assessment and Plan: Subacute pansinusitis -Supportive care -We will start Augmentin -Given precautions - Plan: amoxicillin-clavulanate (AUGMENTIN) 500-125 MG tablet  Dysfunction of both eustachian tubes -OTC allergy medicine  Decreased  appetite -Discussed planning meals throughout the day. Consider drinking boost or Ensure -Discussed follow-up in person  Numbness and tingling  -Discussed possible causes including diabetic neuropathy, vitamin deficiency - Plan: Vitamin B12, Folate    Follow Up Instructions: F/u prn    [id number] 5-10 [id number] 11-20 [id number] 21-30 I did not refer this patient for an OV in the next 24 hours for this/these issue(s).  I discussed the assessment and treatment plan with the patient. The patient was provided an opportunity to ask questions and all were answered. The patient agreed with the plan and demonstrated an understanding of the instructions.   The patient was advised to call back or seek an in-person evaluation if the symptoms worsen or if the condition fails to improve as anticipated.  I provided 13 minutes of non-face-to-face time during this encounter.   [name], MD

## 2020-07-16 ENCOUNTER — Telehealth: Payer: Self-pay | Admitting: Family Medicine

## 2020-07-16 NOTE — Telephone Encounter (Signed)
The patient went to her eye doctor and they were supposed to be sending over some information and get an order for an MRI.  The patient was calling to follow up on this.  Please advise

## 2020-07-18 ENCOUNTER — Other Ambulatory Visit: Payer: Self-pay | Admitting: Surgery

## 2020-07-18 DIAGNOSIS — G453 Amaurosis fugax: Secondary | ICD-10-CM

## 2020-07-19 NOTE — Telephone Encounter (Signed)
Pt is calling in stating that Dr. Marshall Cork [facility]) [phone] will be sending over some information for the pt to have a MRI done so that she is able to have a procedure done in his office.  Pt stated that their office has given her a call wanting her to get in touch with Dr. Volanda Napoleon so that she can have the procedure done as soon as possible.  Pt would like to have a call back so that she is able to get this procedure scheduled w/Dr. Kathlen Mody.

## 2020-07-30 ENCOUNTER — Other Ambulatory Visit: Payer: Self-pay

## 2020-07-30 ENCOUNTER — Ambulatory Visit (INDEPENDENT_AMBULATORY_CARE_PROVIDER_SITE_OTHER): Payer: Medicare Other | Admitting: Podiatry

## 2020-07-30 ENCOUNTER — Encounter: Payer: Self-pay | Admitting: Podiatry

## 2020-07-30 DIAGNOSIS — E1151 Type 2 diabetes mellitus with diabetic peripheral angiopathy without gangrene: Secondary | ICD-10-CM | POA: Diagnosis not present

## 2020-07-30 DIAGNOSIS — M79674 Pain in right toe(s): Secondary | ICD-10-CM

## 2020-07-30 DIAGNOSIS — M2041 Other hammer toe(s) (acquired), right foot: Secondary | ICD-10-CM

## 2020-07-30 DIAGNOSIS — Q828 Other specified congenital malformations of skin: Secondary | ICD-10-CM | POA: Diagnosis not present

## 2020-07-30 DIAGNOSIS — B351 Tinea unguium: Secondary | ICD-10-CM

## 2020-07-30 DIAGNOSIS — M79675 Pain in left toe(s): Secondary | ICD-10-CM

## 2020-07-30 DIAGNOSIS — M2042 Other hammer toe(s) (acquired), left foot: Secondary | ICD-10-CM

## 2020-07-30 NOTE — Progress Notes (Signed)
  Subjective:  Patient ID: Angela Pugh, female    DOB: [birth year],  MRN: [id number]  75 y.o. female presents with preventative diabetic foot care and painful porokeratotic lesion(s) b/l feet and painful mycotic toenails that limit ambulation. Aggravating factors include weightbearing with and without shoe gear. Pain for both is relieved with periodic professional debridement.   Patient states her left great toe porokeratosis is most painful on today's visit. She states Dr. Amalia Hailey always applied medication to her lesions and she would experience less pain between visits.   She also states her shoes were a little too small and this caused the calluses on her great toes.  PCP is Dr. Grier Mitts and last visit was 07/12/2020.  Review of Systems: Negative except as noted in the HPI. Denies N/V/F/Ch.  Allergies  Allergen Reactions  . Acetaminophen Hives and Nausea Only  . Atorvastatin Nausea Only    weakness  . Azithromycin Hives and Nausea And Vomiting  . Zyrtec [Cetirizine]     Hives    Objective:  There were no vitals filed for this visit. Constitutional Patient is a pleasant 75 y.o. African American female, WD, WN in NAD.Marland Kitchen  Vascular Neurovascular status unchanged b/l lower extremities. Capillary refill time to digits immediate b/l. Palpable DP pulse(s) b/l lower extremities Nonpalpable PT pulse(s) b/l lower extremities. Pedal hair sparse. Lower extremity skin temperature gradient within normal limits. No pain with calf compression b/l. No edema noted b/l lower extremities. No cyanosis or clubbing noted.  Neurologic Normal speech. Protective sensation intact 5/5 intact bilaterally with 10g monofilament b/l. Vibratory sensation intact b/l. Proprioception intact bilaterally. Clonus negative b/l.  Dermatologic Pedal skin with normal turgor, texture and tone bilaterally. No open wounds bilaterally. No interdigital macerations bilaterally. Toenails 1-5 b/l elongated, discolored,  dystrophic, thickened, crumbly with subungual debris and tenderness to dorsal palpation. Porokeratotic lesion(s) plantar IPJ L hallux, R hallux, submet head 2 left foot, submet head 2 right foot. No erythema, no edema, no drainage, no flocculence.  Orthopedic: Normal muscle strength 5/5 to all lower extremity muscle groups bilaterally. No pain crepitus or joint limitation noted with ROM b/l. Hammertoes noted to the L 5th toe and R 5th toe.   Radiographs: None Assessment:   1. Pain due to onychomycosis of toenails of both feet   2. Porokeratosis   3. Acquired hammertoes of both feet   4. Type II diabetes mellitus with peripheral circulatory disorder [facility])    Plan:  Patient was evaluated and treated and all questions answered.  Onychomycosis with pain -Nails palliatively debridement as below. -Educated on self-care  Procedure: Nail Debridement Rationale: Pain Type of Debridement: manual, sharp debridement. Instrumentation: Nail nipper, rotary burr. Number of Nails: 10  -Examined patient. -No new findings. No new orders. -Continue diabetic foot care principles. -Regarding great toe pain, we did discuss signs/symptoms of neuropathy and radiculopathy. Monitor for now. -Toenails 1-5 b/l were debrided in length and girth with sterile nail nippers and dremel without iatrogenic bleeding.  -Painful porokeratotic lesion(s) submet head 2 left foot, submet head 2 right foot, b/l hallux pared and enucleated with sterile scalpel blade and sterile #5 currette without incident. Applied Salinocaine Ointment and dressing to lesions. -Will set her up for new diabetic shoes. -Patient/POA to call should there be any questions/concerns in the interim.  Return in about 3 months (around 10/30/2020).  Marzetta Board, DPM

## 2020-07-30 NOTE — Telephone Encounter (Signed)
Spoke with pt, she was aware that MRI had been scheduled.

## 2020-07-31 ENCOUNTER — Encounter: Payer: Self-pay | Admitting: Podiatry

## 2020-08-02 ENCOUNTER — Other Ambulatory Visit: Payer: Self-pay

## 2020-08-02 MED ORDER — DIAZEPAM 2 MG PO TABS: ORAL_TABLET | ORAL | 0 refills | Status: DC

## 2020-08-02 NOTE — Telephone Encounter (Signed)
Per Dr. Volanda Napoleon okay to send in diazepam for patient.

## 2020-08-11 ENCOUNTER — Other Ambulatory Visit: Payer: Self-pay

## 2020-08-11 ENCOUNTER — Ambulatory Visit
Admission: RE | Admit: 2020-08-11 | Discharge: 2020-08-11 | Disposition: A | Discharge status: Home / Self Care | Payer: Medicare Other | Source: Ambulatory Visit | Attending: Surgery | Admitting: Surgery

## 2020-08-11 DIAGNOSIS — G453 Amaurosis fugax: Secondary | ICD-10-CM

## 2020-08-12 ENCOUNTER — Other Ambulatory Visit: Payer: Self-pay | Admitting: Surgery

## 2020-08-12 ENCOUNTER — Telehealth: Payer: Self-pay | Admitting: Family Medicine

## 2020-08-12 DIAGNOSIS — G453 Amaurosis fugax: Secondary | ICD-10-CM

## 2020-08-12 NOTE — Telephone Encounter (Signed)
Angela Pugh  Informed me that the patient was calling back injured about an open Mri at the hospital.I called  The hospital/scheduler/radiologist to confirm if they provide an open MRI. I was told NO, they do not. I called the patient  To inform her of that. The patient said she needed sedation medication because she needed to be knocked out to have the MRI. She said she would call [facility] to be placed back on the schedule.

## 2020-08-12 NOTE — Telephone Encounter (Signed)
Pt is calling in stating that she was not able to do the MRI on yesterday due to her being claustrophobic and would like to see if she can get it scheduled in the hospital.  DRI stated to her that she need to have it done at the hospital where they have a open machine and the pt is wanting something stronger than 2 MG it did not help her at all.  Pt would like to have a call back once it has been scheduled.

## 2020-08-13 DIAGNOSIS — G453 Amaurosis fugax: Secondary | ICD-10-CM | POA: Diagnosis not present

## 2020-08-13 DIAGNOSIS — H40013 Open angle with borderline findings, low risk, bilateral: Secondary | ICD-10-CM | POA: Diagnosis not present

## 2020-08-13 LAB — HM DIABETES EYE EXAM

## 2020-08-29 ENCOUNTER — Other Ambulatory Visit: Payer: Self-pay

## 2020-08-29 MED ORDER — DIAZEPAM 2 MG PO TABS: 4.0000 mg | ORAL_TABLET | Freq: Once | ORAL | 0 refills | Status: AC

## 2020-08-29 NOTE — Telephone Encounter (Signed)
Patient called in requesting Rx for appointment.

## 2020-09-01 ENCOUNTER — Ambulatory Visit
Admission: RE | Admit: 2020-09-01 | Discharge: 2020-09-01 | Disposition: A | Discharge status: Home / Self Care | Payer: Medicare Other | Source: Ambulatory Visit | Attending: Surgery | Admitting: Surgery

## 2020-09-01 ENCOUNTER — Other Ambulatory Visit: Payer: Self-pay

## 2020-09-01 DIAGNOSIS — G453 Amaurosis fugax: Secondary | ICD-10-CM

## 2020-09-01 DIAGNOSIS — H539 Unspecified visual disturbance: Secondary | ICD-10-CM | POA: Diagnosis not present

## 2020-09-01 MED ORDER — GADOBENATE DIMEGLUMINE 529 MG/ML IV SOLN
15.0000 mL | Freq: Once | INTRAVENOUS | Status: AC | PRN
  Administered 2020-09-01: 15 mL via INTRAVENOUS

## 2020-09-04 ENCOUNTER — Other Ambulatory Visit: Payer: Self-pay

## 2020-09-04 ENCOUNTER — Ambulatory Visit: Payer: Medicare Other | Admitting: Podiatry

## 2020-09-04 DIAGNOSIS — M2041 Other hammer toe(s) (acquired), right foot: Secondary | ICD-10-CM

## 2020-09-04 DIAGNOSIS — M2042 Other hammer toe(s) (acquired), left foot: Secondary | ICD-10-CM

## 2020-09-04 DIAGNOSIS — E1151 Type 2 diabetes mellitus with diabetic peripheral angiopathy without gangrene: Secondary | ICD-10-CM

## 2020-09-04 NOTE — Progress Notes (Signed)
Patient presented for foam casting for 3 pair custom diabetic shoe inserts. Patient is measured with a Brannock Device to be a size 11 1/2 wide  Diabetic shoes are chosen from the [employer] step catalog.  The shoes chosen are [id number]  Patient will be contacted when the shoes and inserts are ready to be picked up

## 2020-09-06 ENCOUNTER — Ambulatory Visit: Payer: Medicare Other | Admitting: Gastroenterology

## 2020-09-06 ENCOUNTER — Encounter: Payer: Self-pay | Admitting: Gastroenterology

## 2020-09-06 ENCOUNTER — Other Ambulatory Visit: Payer: Self-pay

## 2020-09-06 VITALS — BP 130/84 | HR 58 | Ht 69.5 in | Wt 165.5 lb

## 2020-09-06 DIAGNOSIS — R109 Unspecified abdominal pain: Secondary | ICD-10-CM | POA: Diagnosis not present

## 2020-09-06 DIAGNOSIS — K5790 Diverticulosis of intestine, part unspecified, without perforation or abscess without bleeding: Secondary | ICD-10-CM

## 2020-09-06 MED ORDER — PANTOPRAZOLE SODIUM 40 MG PO TBEC: 40.0000 mg | DELAYED_RELEASE_TABLET | Freq: Every day | ORAL | 3 refills | Status: DC

## 2020-09-06 MED ORDER — HYOSCYAMINE SULFATE 0.125 MG SL SUBL: SUBLINGUAL_TABLET | SUBLINGUAL | 3 refills | Status: DC

## 2020-09-06 NOTE — Patient Instructions (Addendum)
If you are age 74 or older, your body mass index should be between 23-30. Your Body mass index is 24.09 kg/m. If this is out of the aforementioned range listed, please consider follow up with your Primary Care Provider.  We have sent the following medications to your pharmacy for you to pick up at your convenience:  START: Levsin 0.125 mg take one sublingual (under the tongue) tablet one to two tablets as needed for cramping and bloating.    CONTINUE: pantoprazole 40mg  one tablet daily.  CONTINUE: Lialda 1.2g take 4 tablet daily.  You will follow up with our office on 12-04-2020 at 8:50am.  Please arrive at 8:40am for registration.  Thank you for entrusting me with your care and choosing [facility].  Dr Ardis Hughs

## 2020-09-06 NOTE — Progress Notes (Signed)
Review of pertinent gastrointestinal problems: 1.  Generalized abdominal pain led to a variety of tests 2021 including CT scan November 2021 showed thick walled sigmoid colon without diverticulitis.  Pancreatic atrophy with mild dilation of the main PD.  MRI with [facility] November 2021 showed "stable mild dorsal pancreatic duct dilation, no change since 2012."  EGD December 2021 which showed blue blood type vascular findings in the distal esophagus, mild gastritis, negative for H. pylori on biopsy also colonoscopy December 2021 that was complete to the cecum.  Multiple diverticulosis in the left colon. There was a significant diverticular associated luminal narrowing spasticity and erythema in the left colon and I felt it was possible that that was the source of her abdominal discomforts.  She was started on Lialda 4 pills once daily.  HPI: This is a very pleasant 75 year old woman whom I last saw the time of colonoscopy and upper endoscopy about 4 months ago.  See those results summarized above.  Since then she started Lialda 4 pills once daily and feels "a lot better".  She is having abdominal pains much less often.  She is feeling bloated much less often.  She generally has a BM solid without straining once daily.  She has had 2 events of significant abdominal discomforts with bloating and abdominal cramping and diarrhea.  One lasted 3 days, 1 lasted only a day or so.  She is overall quite happy with how she is doing since starting the Lialda   ROS: complete GI ROS as described in HPI, all other review negative.  Constitutional:  No unintentional weight loss   Past Medical History:  Diagnosis Date  . Arthritis    back, knees, shoulder, hands , injections in pelvis, for bone spurs (05/04/2016)  . Chronic back pain   . Diverticulosis   . History of kidney stones   . HLD (hyperlipidemia)   . Hypertension   . Migraine    hx (05/04/2016)  . Peripheral neuropathy   . Radiculopathy   . Sinus  complaint   . Sinus headache    occasional (05/04/2016)  . Type II diabetes mellitus (HCC)    diet controlled, no meds now, was on insulin & po treatment at one time    . Vertigo     Past Surgical History:  Procedure Laterality Date  . ANTERIOR CERVICAL DECOMP/DISCECTOMY FUSION N/A 04/17/2016   Procedure: ANTERIOR CERVICAL DECOMPRESSION/DISCECTOMY FUSION CERVICAL THREE - CERVICAL FOUR, POSTERIOR CERVICAL DISCECTOMY CERVICAL SEVEN -THORASIC ONE LEFT;  Surgeon: Ashok Pall, MD;  Location: [facility];  Service: Neurosurgery;  Laterality: N/A;  Anterior/Posterior  . ANTERIOR FUSION CERVICAL SPINE  2000   Dr. Carloyn Manner; "for bone spurs; put hardware in too"  . BACK SURGERY    . COLONOSCOPY  06/10/2016   [facility]  . LEFT HEART CATH AND CORONARY ANGIOGRAPHY N/A 03/17/2018   Procedure: LEFT HEART CATH AND CORONARY ANGIOGRAPHY;  Surgeon: Lorretta Harp, MD;  Location: [facility];  Service: Cardiovascular;  Laterality: N/A;  . LUMBAR LAMINECTOMY Left 06/30/2013   Procedure: Left L3-4 Extraforaminal approach to excise far lateral herniated nucleus pulposus;  Surgeon: Jessy Oto, MD;  Location: [facility];  Service: Orthopedics;  Laterality: Left;  . TUBAL LIGATION    . VAGINAL HYSTERECTOMY      Current Outpatient Medications  Medication Sig Dispense Refill  . amLODipine (NORVASC) 5 MG tablet TAKE 1 TABLET(5 MG) BY MOUTH DAILY 15 tablet 0  . aspirin 81 MG EC tablet Take 81 mg by  mouth daily. Swallow whole.    . Calcium Carb-Cholecalciferol (CALCIUM 1000 + D PO)     . famotidine (PEPCID) 20 MG tablet Take 1 tablet (20 mg total) by mouth 3 times/day as needed-between meals & bedtime for heartburn or indigestion. 30 tablet 0  . Loratadine-Pseudoephedrine (CLARITIN-D 24 HOUR PO) Take 1 tablet by mouth as needed.    . mesalamine (LIALDA) 1.2 g EC tablet Take 4 tablets (4.8 g total) by mouth daily with breakfast. 360 tablet 3  . Multiple Vitamins-Minerals (CENTRUM ADULTS PO) Take 1 tablet by mouth daily.      . pantoprazole (PROTONIX) 40 MG tablet TAKE 1 TABLET BY MOUTH 30 MINUTES BEFORE BREAKFAST 90 tablet 0  . potassium chloride SA (KLOR-CON) 20 MEQ tablet Take 1 tab daily. 90 tablet 3  . triamcinolone cream (KENALOG) 0.5 % APPLY EXTERNALLY TO THE AFFECTED AREA TWICE DAILY AS NEEDED FOR DRY SKIN 30 g 3   No current facility-administered medications for this visit.    Allergies as of 09/06/2020 - Review Complete 09/06/2020  Allergen Reaction Noted  . Acetaminophen Hives and Nausea Only 07/02/2010  . Atorvastatin Nausea Only 02/01/2016  . Azithromycin Hives and Nausea And Vomiting 10/18/2017  . Zyrtec [cetirizine]  08/25/2019    Family History  Problem Relation Age of Onset  . Uterine cancer Mother   . Diabetes type II Sister   . Diabetes Mellitus II Brother   . Colon cancer Neg Hx   . Pancreatic cancer Neg Hx   . Esophageal cancer Neg Hx   . Colon polyps Neg Hx   . Rectal cancer Neg Hx   . Stomach cancer Neg Hx     Social History   Socioeconomic History  . Marital status: Widowed    Spouse name: Not on file  . Number of children: Not on file  . Years of education: Not on file  . Highest education level: Not on file  Occupational History  . Not on file  Tobacco Use  . Smoking status: Former Smoker    Packs/day: 0.50    Years: 20.00    Pack years: 10.00    Types: Cigarettes    Quit date: 06/30/1982    Years since quitting: 38.2  . Smokeless tobacco: Never Used  Vaping Use  . Vaping Use: Never used  Substance and Sexual Activity  . Alcohol use: Not Currently    Comment: 05/04/2016 "might have a drink a couple days/year; might not"  . Drug use: No  . Sexual activity: Not Currently  Other Topics Concern  . Not on file  Social History Narrative  . Not on file   Social Determinants of Health   Financial Resource Strain: Low Risk   . Difficulty of Paying Living Expenses: Not hard at all  Food Insecurity: No Food Insecurity  . Worried About Charity fundraiser in  the Last Year: Never true  . Ran Out of Food in the Last Year: Never true  Transportation Needs: No Transportation Needs  . Lack of Transportation (Medical): No  . Lack of Transportation (Non-Medical): No  Physical Activity: Inactive  . Days of Exercise per Week: 0 days  . Minutes of Exercise per Session: 0 min  Stress: No Stress Concern Present  . Feeling of Stress : Not at all  Social Connections: Moderately Isolated  . Frequency of Communication with Friends and Family: More than three times a week  . Frequency of Social Gatherings with Friends and Family: Once a week  .  Attends Religious Services: More than 4 times per year  . Active Member of Clubs or Organizations: No  . Attends Archivist Meetings: Never  . Marital Status: Widowed  Intimate Partner Violence: Not At Risk  . Fear of Current or Ex-Partner: No  . Emotionally Abused: No  . Physically Abused: No  . Sexually Abused: No     Physical Exam: BP 130/84 (BP Location: Left Arm, Patient Position: Sitting, Cuff Size: Normal)   Pulse (!) 58   Ht 5' 9.5" (1.765 m)   Wt 165 lb 8 oz (75.1 kg)   BMI 24.09 kg/m  Constitutional: generally well-appearing Psychiatric: alert and oriented x3 Abdomen: soft, nontender, nondistended, no obvious ascites, no peritoneal signs, normal bowel sounds No peripheral edema noted in lower extremities  Assessment and plan: 75 y.o. female with abdominal pains felt likely due to diverticular associated colitis  She is overall much better since starting Lialda 4 pills once daily.  She will continue this for now.  I am hoping that with more time she will have even less sporadic events of abdominal pains.  I am giving her Levsin sublingual to take as needed.  A new prescription was written today for that.  I am also happy to take over her Protonix which she is getting for her chronic GERD without alarm symptoms.  I will give her 40 mg 1 pill once daily, refills for 1 year.  She will  return to see me in 3 months and sooner if needed.  Please see the "Patient Instructions" section for addition details about the plan.  Owens Loffler, MD [facility] 09/06/2020, 9:03 AM   Total time on date of encounter was 30 minutes (this included time spent preparing to see the patient reviewing records; obtaining and/or reviewing separately obtained history; performing a medically appropriate exam and/or evaluation; counseling and educating the patient and family if present; ordering medications, tests or procedures if applicable; and documenting clinical information in the health record).

## 2020-09-12 ENCOUNTER — Ambulatory Visit (INDEPENDENT_AMBULATORY_CARE_PROVIDER_SITE_OTHER): Payer: Medicare Other | Admitting: Family Medicine

## 2020-09-12 ENCOUNTER — Encounter: Payer: Self-pay | Admitting: Family Medicine

## 2020-09-12 ENCOUNTER — Other Ambulatory Visit: Payer: Self-pay

## 2020-09-12 VITALS — BP 130/82 | HR 114 | Temp 98.3°F | Wt 167.6 lb

## 2020-09-12 DIAGNOSIS — J0100 Acute maxillary sinusitis, unspecified: Secondary | ICD-10-CM

## 2020-09-12 DIAGNOSIS — H66002 Acute suppurative otitis media without spontaneous rupture of ear drum, left ear: Secondary | ICD-10-CM | POA: Diagnosis not present

## 2020-09-12 MED ORDER — DOXYCYCLINE HYCLATE 100 MG PO TABS: 100.0000 mg | ORAL_TABLET | Freq: Two times a day (BID) | ORAL | 0 refills | Status: AC

## 2020-09-12 NOTE — Patient Instructions (Signed)
Otitis Media, Adult  Otitis media is a condition in which the middle ear is red and swollen (inflamed) and full of fluid. The middle ear is the part of the ear that contains bones for hearing as well as air that helps send sounds to the brain. The condition usually goes away on its own. What are the causes? This condition is caused by a blockage in the eustachian tube. The eustachian tube connects the middle ear to the back of the nose. It normally allows air into the middle ear. The blockage is caused by fluid or swelling. Problems that can cause blockage include:  A cold or infection that affects the nose, mouth, or throat.  Allergies.  An irritant, such as tobacco smoke.  Adenoids that have become large. The adenoids are soft tissue located in the back of the throat, behind the nose and the roof of the mouth.  Growth or swelling in the upper part of the throat, just behind the nose (nasopharynx).  Damage to the ear caused by change in pressure. This is called barotrauma. What are the signs or symptoms? Symptoms of this condition include:  Ear pain.  Fever.  Problems with hearing.  Being tired.  Fluid leaking from the ear.  Ringing in the ear. How is this treated? This condition can go away on its own within 3-5 days. But if the condition is caused by bacteria or does not go away on its own, or if it keeps coming back, your doctor may:  Give you antibiotic medicines.  Give you medicines for pain. Follow these instructions at home:  Take over-the-counter and prescription medicines only as told by your doctor.  If you were prescribed an antibiotic medicine, take it as told by your doctor. Do not stop taking the antibiotic even if you start to feel better.  Keep all follow-up visits as told by your doctor. This is important. Contact a doctor if:  You have bleeding from your nose.  There is a lump on your neck.  You are not feeling better in 5 days.  You feel worse  instead of better. Get help right away if:  You have pain that is not helped with medicine.  You have swelling, redness, or pain around your ear.  You get a stiff neck.  You cannot move part of your face (paralysis).  You notice that the bone behind your ear hurts when you touch it.  You get a very bad headache. Summary  Otitis media means that the middle ear is red, swollen, and full of fluid.  This condition usually goes away on its own.  If the problem does not go away, treatment may be needed. You may be given medicines to treat the infection or to treat your pain.  If you were prescribed an antibiotic medicine, take it as told by your doctor. Do not stop taking the antibiotic even if you start to feel better.  Keep all follow-up visits as told by your doctor. This is important. This information is not intended to replace advice given to you by your health care provider. Make sure you discuss any questions you have with your health care provider. Document Revised: 04/06/2019 Document Reviewed: 04/06/2019 Elsevier Patient Education  2021 [place].  How to Perform a Sinus Rinse A sinus rinse is a home treatment. It rinses your sinuses with a mixture of salt and water (saline solution). Sinuses are air-filled spaces in your skull behind the bones of your face and forehead.  They open into your nasal cavity. A sinus rinse can help to clear your nasal cavity. It can clear mucus, dirt, dust, or pollen. You may do a sinus rinse when you have:  A cold.  A virus.  Allergies.  A sinus infection.  A stuffy nose. Talk with your doctor about whether a sinus rinse might help you. What are the risks? A sinus rinse is normally very safe and helpful. However, there are a few risks. These include:  A burning feeling in the sinuses. This may happen if you do not make the saline solution as instructed. Be sure to follow all directions when making the saline solution.  Nasal  irritation.  Infection from unclean water. This is rare, but possible. Do not do a sinus rinse if you have had:  Ear or nasal surgery.  An ear infection.  Blocked ears. Supplies needed:  Saline solution or powder.  Distilled or germ-free (sterile) water may be needed to mix with saline powder. ? You may use boiled and cooled tap water. Boil tap water for 5 minutes; cool until it is lukewarm. Use within 24 hours. ? Do not use regular tap water to mix with the saline solution.  Neti pot or nasal rinse bottle. This releases the saline solution into your nose and through your sinuses. You can buy neti pots and rinse bottles: ? At your local pharmacy. ? At a health food store. ? Online. How to perform a sinus rinse 1. Wash your hands with soap and water. 2. Wash your device using the directions that came with it. 3. Dry your device. 4. Use the solution that comes with your device or one that is sold separately in stores. Follow the mixing directions on the package if you need to mix with sterile or distilled water. 5. Fill your device with the amount of saline solution stated in the device instructions. 6. Stand over a sink and tilt your head sideways over the sink. 7. Place the spout of the device in your upper nostril (the one closer to the ceiling). 8. Gently pour or squeeze the saline solution into your nasal cavity. The liquid should drain to your lower nostril if you are not too stuffed up (congested). 9. While rinsing, breathe through your open mouth. 10. Gently blow your nose to clear any mucus and rinse solution. Blowing too hard may cause ear pain. 11. Repeat in your other nostril. 12. Clean and rinse your device with clean water. 13. Air-dry your device. Talk with your doctor or pharmacist if you have questions about how to do a sinus rinse.   Summary  A sinus rinse is a home treatment. It rinses your sinuses with a mixture of salt and water (saline solution).  A sinus  rinse is normally very safe and helpful. Follow all instructions carefully.  Talk with your doctor about whether a sinus rinse might help you. This information is not intended to replace advice given to you by your health care provider. Make sure you discuss any questions you have with your health care provider. Document Revised: 02/13/2020 Document Reviewed: 02/13/2020 Elsevier Patient Education  2021 [place].  Sinusitis, Adult Sinusitis is soreness and swelling (inflammation) of your sinuses. Sinuses are hollow spaces in the bones around your face. They are located:  Around your eyes.  In the middle of your forehead.  Behind your nose.  In your cheekbones. Your sinuses and nasal passages are lined with a fluid called mucus. Mucus drains out of  your sinuses. Swelling can trap mucus in your sinuses. This lets germs (bacteria, virus, or fungus) grow, which leads to infection. Most of the time, this condition is caused by a virus. What are the causes? This condition is caused by:  Allergies.  Asthma.  Germs.  Things that block your nose or sinuses.  Growths in the nose (nasal polyps).  Chemicals or irritants in the air.  Fungus (rare). What increases the risk? You are more likely to develop this condition if:  You have a weak body defense system (immune system).  You do a lot of swimming or diving.  You use nasal sprays too much.  You smoke. What are the signs or symptoms? The main symptoms of this condition are pain and a feeling of pressure around the sinuses. Other symptoms include:  Stuffy nose (congestion).  Runny nose (drainage).  Swelling and warmth in the sinuses.  Headache.  Toothache.  A cough that may get worse at night.  Mucus that collects in the throat or the back of the nose (postnasal drip).  Being unable to smell and taste.  Being very tired (fatigue).  A fever.  Sore throat.  Bad breath. How is this diagnosed? This condition  is diagnosed based on:  Your symptoms.  Your medical history.  A physical exam.  Tests to find out if your condition is short-term (acute) or long-term (chronic). Your doctor may: ? Check your nose for growths (polyps). ? Check your sinuses using a tool that has a light (endoscope). ? Check for allergies or germs. ? Do imaging tests, such as an MRI or CT scan. How is this treated? Treatment for this condition depends on the cause and whether it is short-term or long-term.  If caused by a virus, your symptoms should go away on their own within 10 days. You may be given medicines to relieve symptoms. They include: ? Medicines that shrink swollen tissue in the nose. ? Medicines that treat allergies (antihistamines). ? A spray that treats swelling of the nostrils. ? Rinses that help get rid of thick mucus in your nose (nasal saline washes).  If caused by bacteria, your doctor may wait to see if you will get better without treatment. You may be given antibiotic medicine if you have: ? A very bad infection. ? A weak body defense system.  If caused by growths in the nose, you may need to have surgery. Follow these instructions at home: Medicines  Take, use, or apply over-the-counter and prescription medicines only as told by your doctor. These may include nasal sprays.  If you were prescribed an antibiotic medicine, take it as told by your doctor. Do not stop taking the antibiotic even if you start to feel better. Hydrate and humidify  Drink enough water to keep your pee (urine) pale yellow.  Use a cool mist humidifier to keep the humidity level in your home above 50%.  Breathe in steam for 10-15 minutes, 3-4 times a day, or as told by your doctor. You can do this in the bathroom while a hot shower is running.  Try not to spend time in cool or dry air.   Rest  Rest as much as you can.  Sleep with your head raised (elevated).  Make sure you get enough sleep each  night. General instructions  Put a warm, moist washcloth on your face 3-4 times a day, or as often as told by your doctor. This will help with discomfort.  Wash your hands often  with soap and water. If there is no soap and water, use hand sanitizer.  Do not smoke. Avoid being around people who are smoking (secondhand smoke).  Keep all follow-up visits as told by your doctor. This is important.   Contact a doctor if:  You have a fever.  Your symptoms get worse.  Your symptoms do not get better within 10 days. Get help right away if:  You have a very bad headache.  You cannot stop throwing up (vomiting).  You have very bad pain or swelling around your face or eyes.  You have trouble seeing.  You feel confused.  Your neck is stiff.  You have trouble breathing. Summary  Sinusitis is swelling of your sinuses. Sinuses are hollow spaces in the bones around your face.  This condition is caused by tissues in your nose that become inflamed or swollen. This traps germs. These can lead to infection.  If you were prescribed an antibiotic medicine, take it as told by your doctor. Do not stop taking it even if you start to feel better.  Keep all follow-up visits as told by your doctor. This is important. This information is not intended to replace advice given to you by your health care provider. Make sure you discuss any questions you have with your health care provider. Document Revised: 10/04/2017 Document Reviewed: 10/04/2017 Elsevier Patient Education  2021 [employer].

## 2020-09-12 NOTE — Progress Notes (Signed)
Subjective:    Patient ID: Angela Pugh, female    DOB: 01-11-1946, 75 y.o.   MRN: [id number]  Chief Complaint  Patient presents with  . Ear Pain    Left ear with pain and clogged sensation  . Sinus Problem    HPI Patient was seen today for acute concern.  Patient endorses nasal congestion worse with lying down, decreased energy and appetite x1 week.  Patient endorses left ear pain/stopped up feeling, postnasal drainage, and occasional cough.  Denies sore throat, fever, chills, nausea, vomiting.  Tried Vick's inhaler and claritin-D.  Past Medical History:  Diagnosis Date  . Arthritis    back, knees, shoulder, hands , injections in pelvis, for bone spurs (05/04/2016)  . Chronic back pain   . Diverticulosis   . History of kidney stones   . HLD (hyperlipidemia)   . Hypertension   . Migraine    hx (05/04/2016)  . Peripheral neuropathy   . Radiculopathy   . Sinus complaint   . Sinus headache    occasional (05/04/2016)  . Type II diabetes mellitus (HCC)    diet controlled, no meds now, was on insulin & po treatment at one time    . Vertigo     Allergies  Allergen Reactions  . Acetaminophen Hives and Nausea Only  . Atorvastatin Nausea Only    weakness  . Azithromycin Hives and Nausea And Vomiting  . Zyrtec [Cetirizine]     Hives    Family History  Problem Relation Age of Onset  . Uterine cancer Mother   . Diabetes type II Sister   . Diabetes Mellitus II Brother   . Colon cancer Neg Hx   . Pancreatic cancer Neg Hx   . Esophageal cancer Neg Hx   . Colon polyps Neg Hx   . Rectal cancer Neg Hx   . Stomach cancer Neg Hx     ROS General: Denies fever, chills, night sweats, changes in weight +changes in appetite, fatigue HEENT: Denies headaches, ear pain, changes in vision, rhinorrhea, sore throat + nasal congestion, left ear "stopped up", headache CV: Denies CP, palpitations, SOB, orthopnea Pulm: Denies SOB, cough, wheezing GI: Denies abdominal pain, nausea,  vomiting, diarrhea, constipation GU: Denies dysuria, hematuria, frequency, vaginal discharge Msk: Denies muscle cramps, joint pains Neuro: Denies weakness, numbness, tingling Skin: Denies rashes, bruising Psych: Denies depression, anxiety, hallucinations    Objective:    Blood pressure 130/82, pulse (!) 114, temperature 98.3 F (36.8 C), temperature source Oral, weight 167 lb 9.6 oz (76 kg), SpO2 99 %.  Gen. Pleasant, well-nourished, in no distress, normal affect   HEENT: [place]/AT, face symmetric, conjunctiva clear, no scleral icterus, PERRLA, EOMI, nares patent without drainage, pharynx without erythema or exudate. L TM with suppurative fluid and erythema.  Right external ear, canal, and TM normal. Lungs: no accessory muscle use, CTAB, no wheezes or rales Cardiovascular: Tachycardic, no peripheral edema Musculoskeletal: No deformities, no cyanosis or clubbing, normal tone Neuro:  A&Ox3, CN II-XII intact, normal gait Skin:  Warm, no lesions/ rash   Wt Readings from Last 3 Encounters:  09/12/20 167 lb 9.6 oz (76 kg)  09/06/20 165 lb 8 oz (75.1 kg)  04/26/20 170 lb (77.1 kg)    Lab Results  Component Value Date   WBC 6.5 03/18/2020   HGB 14.0 03/18/2020   HCT 40.8 03/18/2020   PLT 313.0 03/18/2020   GLUCOSE 103 (H) 03/18/2020   CHOL 231 (H) 07/13/2019   TRIG 94.0 07/13/2019  HDL 54.30 07/13/2019   LDLCALC 158 (H) 07/13/2019   ALT 11 03/18/2020   AST 14 03/18/2020   NA 142 03/18/2020   K 4.0 03/18/2020   CL 102 03/18/2020   CREATININE 0.98 03/18/2020   BUN 13 03/18/2020   CO2 35 (H) 03/18/2020   TSH 1.09 01/12/2020   INR 1.06 01/16/2015   HGBA1C 6.1 (H) 01/12/2020    Assessment/Plan:  Acute suppurative otitis media of left ear without spontaneous rupture of tympanic membrane, recurrence not specified  -symptomatic treatment: tylenol prn -allergies reviewed. - Plan: doxycycline (VIBRA-TABS) 100 MG tablet  Acute maxillary sinusitis, recurrence not specified   -consider saline nasal rinse or OTC nasal spray - Plan: doxycycline (VIBRA-TABS) 100 MG tablet  F/u prn  Grier Mitts, MD

## 2020-09-30 ENCOUNTER — Telehealth: Payer: Self-pay | Admitting: Podiatry

## 2020-09-30 NOTE — Telephone Encounter (Signed)
The patient contacted the office to have Angela Pugh send office notes to [name]. After looking at the notes from [name] I scheduled her an appointment to follow up with Dr. Volanda Napoleon on 10/03/2020

## 2020-09-30 NOTE — Telephone Encounter (Signed)
Pt called checking on diabetic shoes.  The safestep website is not working. I told pt I would call back when I could check status.  I returned call and the pcp put in that last office visit for diabetes was 8.2021 and it has to be within 6 months. I left message for pt that she needs an updated office visit with pcp and to let me know and I can resend paperwork.

## 2020-10-02 ENCOUNTER — Other Ambulatory Visit: Payer: Self-pay

## 2020-10-03 ENCOUNTER — Ambulatory Visit (INDEPENDENT_AMBULATORY_CARE_PROVIDER_SITE_OTHER): Payer: Medicare Other

## 2020-10-03 ENCOUNTER — Telehealth: Payer: Self-pay | Admitting: Family Medicine

## 2020-10-03 ENCOUNTER — Encounter: Payer: Self-pay | Admitting: Family Medicine

## 2020-10-03 ENCOUNTER — Ambulatory Visit (INDEPENDENT_AMBULATORY_CARE_PROVIDER_SITE_OTHER): Payer: Medicare Other | Admitting: Family Medicine

## 2020-10-03 VITALS — BP 122/78 | HR 52 | Temp 98.5°F | Wt 165.8 lb

## 2020-10-03 DIAGNOSIS — R001 Bradycardia, unspecified: Secondary | ICD-10-CM | POA: Diagnosis not present

## 2020-10-03 DIAGNOSIS — J302 Other seasonal allergic rhinitis: Secondary | ICD-10-CM

## 2020-10-03 DIAGNOSIS — R42 Dizziness and giddiness: Secondary | ICD-10-CM

## 2020-10-03 DIAGNOSIS — E1169 Type 2 diabetes mellitus with other specified complication: Secondary | ICD-10-CM | POA: Diagnosis not present

## 2020-10-03 DIAGNOSIS — I447 Left bundle-branch block, unspecified: Secondary | ICD-10-CM | POA: Diagnosis not present

## 2020-10-03 DIAGNOSIS — I10 Essential (primary) hypertension: Secondary | ICD-10-CM | POA: Diagnosis not present

## 2020-10-03 DIAGNOSIS — R0602 Shortness of breath: Secondary | ICD-10-CM

## 2020-10-03 DIAGNOSIS — R059 Cough, unspecified: Secondary | ICD-10-CM

## 2020-10-03 DIAGNOSIS — I428 Other cardiomyopathies: Secondary | ICD-10-CM | POA: Diagnosis not present

## 2020-10-03 DIAGNOSIS — J439 Emphysema, unspecified: Secondary | ICD-10-CM | POA: Diagnosis not present

## 2020-10-03 LAB — COMPREHENSIVE METABOLIC PANEL
ALT: 10 U/L (ref 0–35)
AST: 13 U/L (ref 0–37)
Albumin: 4.2 g/dL (ref 3.5–5.2)
Alkaline Phosphatase: 66 U/L (ref 39–117)
BUN: 14 mg/dL (ref 6–23)
CO2: 32 mEq/L (ref 19–32)
Calcium: 9.7 mg/dL (ref 8.4–10.5)
Chloride: 101 mEq/L (ref 96–112)
Creatinine, Ser: 1.04 mg/dL (ref 0.40–1.20)
GFR: 53.1 mL/min — ABNORMAL LOW (ref >60.00)
Glucose, Bld: 114 mg/dL — ABNORMAL HIGH (ref 70–99)
Potassium: 4.5 mEq/L (ref 3.5–5.1)
Sodium: 140 mEq/L (ref 135–145)
Total Bilirubin: 0.5 mg/dL (ref 0.2–1.2)
Total Protein: 7.5 g/dL (ref 6.0–8.3)

## 2020-10-03 LAB — CBC WITH DIFFERENTIAL/PLATELET
Basophils Absolute: 0.1 10*3/uL (ref 0.0–0.1)
Basophils Relative: 1 % (ref 0.0–3.0)
Eosinophils Absolute: 0.1 10*3/uL (ref 0.0–0.7)
Eosinophils Relative: 2.1 % (ref 0.0–5.0)
HCT: 41 % (ref 36.0–46.0)
Hemoglobin: 14.1 g/dL (ref 12.0–15.0)
Lymphocytes Relative: 45 % (ref 12.0–46.0)
Lymphs Abs: 2.5 10*3/uL (ref 0.7–4.0)
MCHC: 34.3 g/dL (ref 30.0–36.0)
MCV: 94.5 fl (ref 78.0–100.0)
Monocytes Absolute: 0.5 10*3/uL (ref 0.1–1.0)
Monocytes Relative: 10 % (ref 3.0–12.0)
Neutro Abs: 2.3 10*3/uL (ref 1.4–7.7)
Neutrophils Relative %: 41.9 % — ABNORMAL LOW (ref 43.0–77.0)
Platelets: 313 10*3/uL (ref 150.0–400.0)
RBC: 4.34 Mil/uL (ref 3.87–5.11)
RDW: 13.3 % (ref 11.5–15.5)
WBC: 5.5 10*3/uL (ref 4.0–10.5)

## 2020-10-03 LAB — HEMOGLOBIN A1C: Hgb A1c MFr Bld: 6.3 % (ref 4.6–6.5)

## 2020-10-03 LAB — TSH: TSH: 1.19 u[IU]/mL (ref 0.35–4.50)

## 2020-10-03 LAB — T4, FREE: Free T4: 0.92 ng/dL (ref 0.60–1.60)

## 2020-10-03 LAB — BRAIN NATRIURETIC PEPTIDE: Pro B Natriuretic peptide (BNP): 69 pg/mL (ref 0.0–100.0)

## 2020-10-03 MED ORDER — FEXOFENADINE HCL 180 MG PO TABS: 180.0000 mg | ORAL_TABLET | Freq: Every day | ORAL | 3 refills | Status: DC

## 2020-10-03 NOTE — Progress Notes (Addendum)
Subjective:    Patient ID: Angela Pugh, female    DOB: Feb 10, 1946, 75 y.o.   MRN: [id number]  Chief Complaint  Patient presents with  . Follow-up    Foot - needs visit for they okay on DM shoes. They have been ordering them too small    HPI Patient was seen today for several concerns.  Patient seen by podiatry for diabetic shoes, however they are too small.  Patient states prior to having insoles placed in shoes they fit fine.  After insoles were placed in shoes they cause sores on her toes as they rub against the shoe.  Hemoglobin A1c 6.1% on 01/13/2020.  DM diet controlled.  Patient mentions ongoing cough that sounds like it is "coming from chest".  Also feels like left ear is stopped up.  Patient taking Claritin-D for allergies which has not been working.  In the past Zyrtec caused hives.  Patient endorses intermittent dizziness.  Felt dizzy this morning.  Had 3 peanut butter cheese crackers and coffee.  Notes increased thirst over the weekend.  Typically drinks three 8 ounce bottles of water, Gatorade, and tea during the day.  Patient also endorses SOB with going up stairs but has not paid much attention to it.  Patient has upcoming GI appointment.  Past Medical History:  Diagnosis Date  . Arthritis    back, knees, shoulder, hands , injections in pelvis, for bone spurs (05/04/2016)  . Chronic back pain   . Diverticulosis   . History of kidney stones   . HLD (hyperlipidemia)   . Hypertension   . Migraine    hx (05/04/2016)  . Peripheral neuropathy   . Radiculopathy   . Sinus complaint   . Sinus headache    occasional (05/04/2016)  . Type II diabetes mellitus (HCC)    diet controlled, no meds now, was on insulin & po treatment at one time    . Vertigo     Allergies  Allergen Reactions  . Acetaminophen Hives and Nausea Only  . Atorvastatin Nausea Only    weakness  . Azithromycin Hives and Nausea And Vomiting  . Zyrtec [Cetirizine]     Hives     ROS General: Denies  fever, chills, night sweats, changes in weight, changes in appetite  + dizziness HEENT: Denies headaches, ear pain, changes in vision, rhinorrhea, sore throat  + left ear feeling clogged CV: Denies CP, palpitations, orthopnea  + SOB Pulm: Denies wheezing  + SOB, cough GI: Denies abdominal pain, nausea, vomiting, diarrhea, constipation GU: Denies dysuria, hematuria, frequency, vaginal discharge Msk: Denies muscle cramps, joint pains   Neuro: Denies weakness, numbness, tingling Skin: Denies rashes, bruising  + sore on toes Psych: Denies depression, anxiety, hallucinations     Objective:    Blood pressure 122/78, pulse (!) 52, temperature 98.5 F (36.9 C), temperature source Oral, weight 165 lb 12.8 oz (75.2 kg), SpO2 99 %.  Gen. Pleasant, well-nourished, in no distress, normal affect   HEENT: [place]/AT, face symmetric, conjunctiva clear, no scleral icterus, PERRLA, EOMI, nares patent without drainage, pharynx with postnasal drainage, no erythema or exudate. Neck: no bruits b/l Lungs: no accessory muscle use, transmitted upper airway noises in posterior RUL, CTAB after coughing, no wheezes or rales Cardiovascular: Bradycardia,1/6 murmur best heard LUSB, no peripheral edema Musculoskeletal: No deformities, no cyanosis or clubbing, normal tone Neuro:  A&Ox3, CN II-XII intact, normal gait Skin:  Warm, dry, intact.  Erythema of right first, second, and third toes  Wt Readings from Last 3 Encounters:  10/03/20 165 lb 12.8 oz (75.2 kg)  09/12/20 167 lb 9.6 oz (76 kg)  09/06/20 165 lb 8 oz (75.1 kg)    Lab Results  Component Value Date   WBC 6.5 03/18/2020   HGB 14.0 03/18/2020   HCT 40.8 03/18/2020   PLT 313.0 03/18/2020   GLUCOSE 103 (H) 03/18/2020   CHOL 231 (H) 07/13/2019   TRIG 94.0 07/13/2019   HDL 54.30 07/13/2019   LDLCALC 158 (H) 07/13/2019   ALT 11 03/18/2020   AST 14 03/18/2020   NA 142 03/18/2020   K 4.0 03/18/2020   CL 102 03/18/2020   CREATININE 0.98 03/18/2020    BUN 13 03/18/2020   CO2 35 (H) 03/18/2020   TSH 1.09 01/12/2020   INR 1.06 01/16/2015   HGBA1C 6.1 (H) 01/12/2020    Assessment/Plan:  Bradycardia -asymptomatic -Heart rate initially 49 then increased to 52 bpm. -EKG with sinus bradycardia HR 45, first-degree AV block as PR >200 msec, LBBB. -LBBB, prolonged PR, bradycardia noted on previous EKGs as far back as 01/16/2015 -Urgent referral placed to cardiology.  Last Seen 10/11/2018. - Plan: EKG 12-Lead, referral to Cardiology  Dizziness  -improving -discussed possible causes including hypotension, bradycardia, hypo or hyperglycemia, worsened carotid artery stenosis -EKG with sinus bradycardia, HR 45, first-degree AV block, LBBB.  Similar to previous EKGs -carotid doppler May 2020 with moderate b/l ICA stenosis -repeat doppler -referral to Cards - Plan: EKG 12-Lead  Seasonal allergies  -worsening -Unable to take Zyrtec 2/2 causing hives. -We will d/cClaritin-D as not working - Plan: fexofenadine (ALLEGRA ALLERGY) 180 MG tablet  Cough  -Possible causes including postnasal drainage 2/2 seasonal allergies, pulmonary edema -d/c'ing Claritin-D.   -Will start Allegra.  H/o Allergy to Zyrtec-hives. - Plan: DG Chest 2 View  SOB (shortness of breath) -stable -reviewed possible causes including allergies, deconditioning, reactive airway dz, worsening nonischemic Cardiomyopathy -will evaluate for acute lung process with CXR -given strict precautions - Plan: EKG 12-Lead, DG Chest 2 View, BNP  Type 2 diabetes mellitus with other specified complication, without long-term current use of insulin (HCC) -Currently diet controlled -Hemoglobin A1c 6.1% on 01/13/2020 -BP controlled -previously on ARB -discussed statin as total cholesterol and LDL elevated (231,158 respectively) on 07/12/20.  Atorvastatin caused nausea and weakness in the past. -last foot exam 02/28/20.  Followed by podiatry.  Will need larger size diabetic shoes to prevent  toes from rubbing when insoles placed in shoes.  LBBB -stable -noted on EKG this visit and on previous EKGs from 10/18/2018, 03/10/2018  Nonischemic Cardiomyopathy ([place]) -last seen by Cards 10/11/2018, likely lost to f/u 2/2 COVID-19 pandemic -Outpt diagnostic radial cardiac cath 03/17/18 with normal coronary arteries and mod LV dysfunction EF 40%.  EF 35% by 2D ECHO -Plan: referral to Cardiology  Essential HTN -controlled -continue norvasc 5 mg -lifestyle modifications  F/u prn  Grier Mitts, MD

## 2020-10-03 NOTE — Patient Instructions (Signed)
Shortness of Breath, Adult Shortness of breath is when a person has trouble breathing enough air or when a person feels like she or he is having trouble breathing in enough air. Shortness of breath could be a sign of a medical problem. Follow these instructions at home:  Pay attention to any changes in your symptoms.  Do not use any products that contain nicotine or tobacco, such as cigarettes, e-cigarettes, and chewing tobacco.  Do not smoke. Smoking is a common cause of shortness of breath. If you need help quitting, ask your health care provider.  Avoid things that can irritate your airways, such as: ? Mold. ? Dust. ? Air pollution. ? Chemical fumes. ? Things that can cause allergy symptoms (allergens), if you have allergies.  Keep your living space clean and free of mold and dust.  Rest as needed. Slowly return to your usual activities.  Take over-the-counter and prescription medicines only as told by your health care provider. This includes oxygen therapy and inhaled medicines.  Keep all follow-up visits as told by your health care provider. This is important.   Contact a health care provider if:  Your condition does not improve as soon as expected.  You have a hard time doing your normal activities, even after you rest.  You have new symptoms. Get help right away if:  Your shortness of breath gets worse.  You have shortness of breath when you are resting.  You feel light-headed or you faint.  You have a cough that is not controlled with medicines.  You cough up blood.  You have pain with breathing.  You have pain in your chest, arms, shoulders, or abdomen.  You have a fever.  You cannot walk up stairs or exercise the way that you normally do. These symptoms may represent a serious problem that is an emergency. Do not wait to see if the symptoms will go away. Get medical help right away. Call your local emergency services (911 in the [place] Do not drive yourself  to the hospital. Summary  Shortness of breath is when a person has trouble breathing enough air. It can be a sign of a medical problem.  Avoid things that irritate your lungs, such as smoking, pollution, mold, and dust.  Pay attention to changes in your symptoms and contact your health care provider if you have a hard time completing daily activities because of shortness of breath. This information is not intended to replace advice given to you by your health care provider. Make sure you discuss any questions you have with your health care provider. Document Revised: 10/04/2017 Document Reviewed: 10/04/2017 Elsevier Patient Education  2021 [place]. Dizziness Dizziness is a common problem. It is a feeling of unsteadiness or light-headedness. You may feel like you are about to faint. Dizziness can lead to injury if you stumble or fall. Anyone can become dizzy, but dizziness is more common in older adults. This condition can be caused by a number of things, including medicines, dehydration, or illness. Follow these instructions at home: Eating and drinking  Drink enough fluid to keep your urine clear or pale yellow. This helps to keep you from becoming dehydrated. Try to drink more clear fluids, such as water.  Do not drink alcohol.  Limit your caffeine intake if told to do so by your health care provider. Check ingredients and nutrition facts to see if a food or beverage contains caffeine.  Limit your salt (sodium) intake if told to do so by  your health care provider. Check ingredients and nutrition facts to see if a food or beverage contains sodium. Activity  Avoid making quick movements. ? Rise slowly from chairs and steady yourself until you feel okay. ? In the morning, first sit up on the side of the bed. When you feel okay, stand slowly while you hold onto something until you know that your balance is fine.  If you need to stand in one place for a long time, move your legs  often. Tighten and relax the muscles in your legs while you are standing.  Do not drive or use heavy machinery if you feel dizzy.  Avoid bending down if you feel dizzy. Place items in your home so that they are easy for you to reach without leaning over. Lifestyle  Do not use any products that contain nicotine or tobacco, such as cigarettes and e-cigarettes. If you need help quitting, ask your health care provider.  Try to reduce your stress level by using methods such as yoga or meditation. Talk with your health care provider if you need help to manage your stress. General instructions  Watch your dizziness for any changes.  Take over-the-counter and prescription medicines only as told by your health care provider. Talk with your health care provider if you think that your dizziness is caused by a medicine that you are taking.  Tell a friend or a family member that you are feeling dizzy. If he or she notices any changes in your behavior, have this person call your health care provider.  Keep all follow-up visits as told by your health care provider. This is important. Contact a health care provider if:  Your dizziness does not go away.  Your dizziness or light-headedness gets worse.  You feel nauseous.  You have reduced hearing.  You have new symptoms.  You are unsteady on your feet or you feel like the room is spinning. Get help right away if:  You vomit or have diarrhea and are unable to eat or drink anything.  You have problems talking, walking, swallowing, or using your arms, hands, or legs.  You feel generally weak.  You are not thinking clearly or you have trouble forming sentences. It may take a friend or family member to notice this.  You have chest pain, abdominal pain, shortness of breath, or sweating.  Your vision changes.  You have any bleeding.  You have a severe headache.  You have neck pain or a stiff neck.  You have a fever. These symptoms may  represent a serious problem that is an emergency. Do not wait to see if the symptoms will go away. Get medical help right away. Call your local emergency services (911 in the [place] Do not drive yourself to the hospital. Summary  Dizziness is a feeling of unsteadiness or light-headedness. This condition can be caused by a number of things, including medicines, dehydration, or illness.  Anyone can become dizzy, but dizziness is more common in older adults.  Drink enough fluid to keep your urine clear or pale yellow. Do not drink alcohol.  Avoid making quick movements if you feel dizzy. Monitor your dizziness for any changes. This information is not intended to replace advice given to you by your health care provider. Make sure you discuss any questions you have with your health care provider. Document Revised: 05/07/2017 Document Reviewed: 06/06/2016 Elsevier Patient Education  2021 [place].  Bradycardia, Adult Bradycardia is a slower-than-normal heartbeat. A normal resting heart rate  for an adult ranges from 60 to 100 beats per minute. With bradycardia, the resting heart rate is less than 60 beats per minute. Bradycardia can prevent enough oxygen from reaching certain areas of your body when you are active. It can be serious if it keeps enough oxygen from reaching your brain and other parts of your body. Bradycardia is not a problem for everyone. For some healthy adults, a slow resting heart rate is normal. What are the causes? This condition may be caused by:  A problem with the heart, including: ? A problem with the heart's electrical system, such as a heart block. With a heart block, electrical signals between the chambers of the heart are partially or completely blocked, so they are not able to work as they should. ? A problem with the heart's natural pacemaker (sinus node). ? Heart disease. ? A heart attack. ? Heart damage. ? Lyme disease. ? A heart infection. ? A heart  condition that is present at birth (congenital heart defect).  Certain medicines that treat heart conditions.  Certain conditions, such as hypothyroidism and obstructive sleep apnea.  Problems with the balance of chemicals and other substances, like potassium, in the blood.  Trauma.  Radiation therapy.   What increases the risk? You are more likely to develop this condition if you:  Are age 31 or older.  Have high blood pressure (hypertension), high cholesterol (hyperlipidemia), or diabetes.  Drink heavily, use tobacco or nicotine products, or use drugs. What are the signs or symptoms? Symptoms of this condition include:  Light-headedness.  Feeling faint or fainting.  Fatigue and weakness.  Trouble with activity or exercise.  Shortness of breath.  Chest pain (angina).  Drowsiness.  Confusion.  Dizziness. How is this diagnosed? This condition may be diagnosed based on:  Your symptoms.  Your medical history.  A physical exam. During the exam, your health care provider will listen to your heartbeat and check your pulse. To confirm the diagnosis, your health care provider may order tests, such as:  Blood tests.  An electrocardiogram (ECG). This test records the heart's electrical activity. The test can show how fast your heart is beating and whether the heartbeat is steady.  A test in which you wear a portable device (event recorder or Holter monitor) to record your heart's electrical activity while you go about your day.  Anexercise test. How is this treated? Treatment for this condition depends on the cause of the condition and how severe your symptoms are. Treatment may involve:  Treatment of the underlying condition.  Changing your medicines or how much medicine you take.  Having a small, battery-operated device called a pacemaker implanted under the skin. When bradycardia occurs, this device can be used to increase your heart rate and help your heart  beat in a regular rhythm. Follow these instructions at home: Lifestyle  Manage any health conditions that contribute to bradycardia as told by your health care provider.  Follow a heart-healthy diet. A nutrition specialist (dietitian) can help educate you about healthy food options and changes.  Follow an exercise program that is approved by your health care provider.  Maintain a healthy weight.  Try to reduce or manage your stress, such as with yoga or meditation. If you need help reducing stress, ask your health care provider.  Do not use any products that contain nicotine or tobacco, such as cigarettes, e-cigarettes, and chewing tobacco. If you need help quitting, ask your health care provider.  Do not use  illegal drugs.  Limit alcohol intake to no more than 1 drink a day for nonpregnant women and 2 drinks a day for men. Be aware of how much alcohol is in your drink. In the [place] one drink equals one 12 oz bottle of beer (355 mL), one 5 oz glass of wine (148 mL), or one 1 oz glass of hard liquor (44 mL).   General instructions  Take over-the-counter and prescription medicines only as told by your health care provider.  Keep all follow-up visits as told by your health care provider. This is important. How is this prevented? In some cases, bradycardia may be prevented by:  Treating underlying medical problems.  Stopping behaviors or medicines that can trigger the condition. Contact a health care provider if you:  Feel light-headed or dizzy.  Almost faint.  Feel weak or are easily fatigued during physical activity.  Experience confusion or have memory problems. Get help right away if:  You faint.  You have: ? An irregular heartbeat (palpitations). ? Chest pain. ? Trouble breathing. Summary  Bradycardia is a slower-than-normal heartbeat. With bradycardia, the resting heart rate is less than 60 beats per minute.  Treatment for this condition depends on the  cause.  Manage any health conditions that contribute to bradycardia as told by your health care provider.  Do not use any products that contain nicotine or tobacco, such as cigarettes, e-cigarettes, and chewing tobacco, and limit alcohol intake.  Keep all follow-up visits as told by your health care provider. This is important. This information is not intended to replace advice given to you by your health care provider. Make sure you discuss any questions you have with your health care provider. Document Revised: 11/15/2017 Document Reviewed: 10/13/2017 Elsevier Patient Education  [place].

## 2020-10-03 NOTE — Telephone Encounter (Signed)
Patient is calling back and wanted to let the provider know that she saw Dr. Lorie Phenix MD a cardiologist at  [facility]. Contact information is [phone]. CB is [phone]

## 2020-10-04 ENCOUNTER — Encounter: Payer: Self-pay | Admitting: Family Medicine

## 2020-10-04 DIAGNOSIS — J439 Emphysema, unspecified: Secondary | ICD-10-CM | POA: Insufficient documentation

## 2020-10-04 NOTE — Telephone Encounter (Signed)
Noted  

## 2020-10-07 NOTE — Progress Notes (Signed)
Spoke with patient, is aware and appointment scheduled. 

## 2020-10-07 NOTE — Progress Notes (Signed)
Spoke with patient, is aware and appointment scheduled.

## 2020-10-09 ENCOUNTER — Other Ambulatory Visit: Payer: Self-pay

## 2020-10-10 ENCOUNTER — Ambulatory Visit (INDEPENDENT_AMBULATORY_CARE_PROVIDER_SITE_OTHER): Payer: Medicare Other | Admitting: Family Medicine

## 2020-10-10 ENCOUNTER — Ambulatory Visit: Payer: Medicare Other | Admitting: Cardiovascular Disease

## 2020-10-10 ENCOUNTER — Encounter: Payer: Self-pay | Admitting: Cardiovascular Disease

## 2020-10-10 ENCOUNTER — Encounter: Payer: Self-pay | Admitting: Family Medicine

## 2020-10-10 ENCOUNTER — Ambulatory Visit (INDEPENDENT_AMBULATORY_CARE_PROVIDER_SITE_OTHER): Payer: Medicare Other

## 2020-10-10 VITALS — BP 140/90 | HR 58 | Temp 98.3°F | Ht 69.0 in | Wt 165.8 lb

## 2020-10-10 VITALS — BP 130/74 | HR 61 | Ht 69.0 in | Wt 165.6 lb

## 2020-10-10 DIAGNOSIS — E782 Mixed hyperlipidemia: Secondary | ICD-10-CM | POA: Diagnosis not present

## 2020-10-10 DIAGNOSIS — J439 Emphysema, unspecified: Secondary | ICD-10-CM

## 2020-10-10 DIAGNOSIS — N1831 Chronic kidney disease, stage 3a: Secondary | ICD-10-CM | POA: Diagnosis not present

## 2020-10-10 DIAGNOSIS — R001 Bradycardia, unspecified: Secondary | ICD-10-CM

## 2020-10-10 DIAGNOSIS — I10 Essential (primary) hypertension: Secondary | ICD-10-CM

## 2020-10-10 DIAGNOSIS — R7303 Prediabetes: Secondary | ICD-10-CM

## 2020-10-10 DIAGNOSIS — I428 Other cardiomyopathies: Secondary | ICD-10-CM

## 2020-10-10 DIAGNOSIS — I447 Left bundle-branch block, unspecified: Secondary | ICD-10-CM

## 2020-10-10 NOTE — Progress Notes (Signed)
Subjective:    Patient ID: Angela Pugh, female    DOB: March 10, 1946, 75 y.o.   MRN: [id number]  Chief Complaint  Patient presents with  . Results  Patient accompanied by her daughter, [name].  HPI Patient was seen today for f/u on results and ongoing concerns.  Patient endorses increased stress after hearing lab results.  Per patient's daughter, patient stated she was "dying and kidneys are failing".   Patient advised her hemoglobin A1c was 6.3% and her kidney function was slightly decreased with a GFR of 53.1 which met chronic kidney disease stage IIIa.  Patient drinking 1 bottle of water per day if that.  Recent chest x-ray with emphysema and chronic changes vs infection.  Patient is a former smoker.  Notes some SOB with exertion.  Patient also had recent follow-up with cardiology for bradycardia and recent chest pain.  Patient to wear Holter monitor in the next few weeks.  Past Medical History:  Diagnosis Date  . Arthritis    back, knees, shoulder, hands , injections in pelvis, for bone spurs (05/04/2016)  . Chronic back pain   . Diverticulosis   . History of kidney stones   . HLD (hyperlipidemia)   . Hypertension   . Migraine    hx (05/04/2016)  . Peripheral neuropathy   . Radiculopathy   . Sinus complaint   . Sinus headache    occasional (05/04/2016)  . Type II diabetes mellitus (HCC)    diet controlled, no meds now, was on insulin & po treatment at one time    . Vertigo     Allergies  Allergen Reactions  . Acetaminophen Hives and Nausea Only  . Atorvastatin Nausea Only    weakness  . Azithromycin Hives and Nausea And Vomiting  . Zyrtec [Cetirizine]     Hives     ROS General: Denies fever, chills, night sweats, changes in weight, changes in appetite HEENT: Denies headaches, ear pain, changes in vision, rhinorrhea, sore throat CV: Denies CP, palpitations, orthopnea  + SOB, emphysema, CP Pulm: Denies SOB, cough, wheezing GI: Denies abdominal pain, nausea,  vomiting, diarrhea, constipation GU: Denies dysuria, hematuria, frequency, vaginal discharge Msk: Denies muscle cramps, joint pains Neuro: Denies weakness, numbness, tingling Skin: Denies rashes, bruising Psych: Denies depression, hallucinations + anxiety     Objective:    Blood pressure 140/90, pulse (!) 58, temperature 98.3 F (36.8 C), temperature source Oral, height 5' 9" (1.753 m), weight 165 lb 12.8 oz (75.2 kg), SpO2 99 %.  Gen. Pleasant, well-nourished, in no distress, normal affect  HEENT: [place]/AT, face symmetric, conjunctiva clear, no scleral icterus, PERRLA, EOMI, nares patent without drainage Lungs: no accessory muscle use Cardiovascular: RRR, no peripheral edema Musculoskeletal: No deformities, no cyanosis or clubbing, normal tone Neuro:  A&Ox3, CN II-XII intact, normal gait Skin:  Warm, no lesions/ rash   Wt Readings from Last 3 Encounters:  10/10/20 165 lb 12.8 oz (75.2 kg)  10/10/20 165 lb 9.6 oz (75.1 kg)  10/03/20 165 lb 12.8 oz (75.2 kg)    Lab Results  Component Value Date   WBC 5.5 10/03/2020   HGB 14.1 10/03/2020   HCT 41.0 10/03/2020   PLT 313.0 10/03/2020   GLUCOSE 114 (H) 10/03/2020   CHOL 231 (H) 07/13/2019   TRIG 94.0 07/13/2019   HDL 54.30 07/13/2019   LDLCALC 158 (H) 07/13/2019   ALT 10 10/03/2020   AST 13 10/03/2020   NA 140 10/03/2020   K 4.5 10/03/2020   CL 101  10/03/2020   CREATININE 1.04 10/03/2020   BUN 14 10/03/2020   CO2 32 10/03/2020   TSH 1.19 10/03/2020   INR 1.06 01/16/2015   HGBA1C 6.3 10/03/2020    Assessment/Plan:  Chronic kidney disease, stage 3a (HCC) -GFR 53.1 on 10/03/2020.  Previously 57.24 in November 2021 -Patient encouraged to increase p.o. intake of water and fluids -Renally dose medications and avoid nephrotoxic medications -We will continue to monitor -Given handout -For worsening renal function will place referral to nephrology  Essential hypertension -Elevated -Likely 2/2 increased  anxiety/stress -Discussed the importance of lifestyle modification -Continue Norvasc 5 mg -for continued elevation adjust dose.  Pulmonary emphysema, unspecified emphysema type ([place])  -CXR 10/03/20 with mild reticulonodular changes at the R >L lung base, potentially multifocal infection vs worsening of chronic changes.  Emphysema. - Plan: Ambulatory referral to Pulmonology  Prediabetes -hgb A1C 6.3% on 10/03/20 -lifestyle modifications strongly encouraged  Mixed hyperlipidemia -Total cholesterol 231 07/13/2019 -lifestyle modifications -intolerance to atorvastatin, caused nausea and weakness -OTC fish oil tablets  F/u in 1 month  Grier Mitts, MD

## 2020-10-10 NOTE — Assessment & Plan Note (Signed)
Chronic. 

## 2020-10-10 NOTE — Patient Instructions (Addendum)
Consider taking fish oil tablets daily to help improve your cholesterol.  You should also increase your intake of water daily. Chronic Kidney Disease, Adult Chronic kidney disease (CKD) occurs when the kidneys are slowly and permanently damaged over a long period of time. The kidneys are a pair of organs that do many important jobs in the body, including:  Removing waste and extra fluid from the blood to make urine.  Making hormones that maintain the amount of fluid in tissues and blood vessels.  Maintaining the right amount of fluids and chemicals in the body. A small amount of kidney damage may not cause problems, but a large amount of damage may make it hard or impossible for the kidneys to work right. Steps must be taken to slow kidney damage or to stop it from getting worse. If steps are not taken, the kidneys may stop working permanently (end-stage renal disease, or ESRD). Most of the time, CKD does not go away, but it can often be controlled. People who have CKD are usually able to live full lives. What are the causes? The most common causes of this condition are diabetes and high blood pressure (hypertension). Other causes include:  Cardiovascular diseases. These affect the heart and blood vessels.  Kidney diseases. These include: ? Glomerulonephritis, or inflammation of the tiny filters in the kidneys. ? Interstitial nephritis. This is swelling of the small tubes of the kidneys and of the surrounding structures. ? Polycystic kidney disease, in which clusters of fluid-filled sacs form within the kidneys. ? Renal vascular disease. This includes disorders that affect the arteries and veins of the kidneys.  Diseases that affect the body's defense system (immune system).  A problem with urine flow. This may be caused by: ? Kidney stones. ? Cancer. ? An enlarged prostate, in males.  A kidney infection or urinary tract infection (UTI) that keeps coming back.  Vasculitis. This is  swelling or inflammation of the blood vessels. What increases the risk? Your chances of having kidney disease increase with age. The following factors may make you more likely to develop this condition:  A family history of kidney disease or kidney failure. Kidney failure means the kidneys can no longer work right.  Certain genetic diseases.  Taking medicines often that are damaging to the kidneys.  Being around or being in contact with toxic substances.  Obesity.  A history of tobacco use. What are the signs or symptoms? Symptoms of this condition include:  Feeling very tired (lethargic) and having less energy.  Swelling, or edema, of the face, legs, ankles, or feet.  Nausea or vomiting, or loss of appetite.  Confusion or trouble concentrating.  Muscle twitches and cramps, especially in the legs.  Dry, itchy skin.  A metallic taste in the mouth.  Producing less urine, or producing more urine (especially at night).  Shortness of breath.  Trouble sleeping. CKD may also result in not having enough red blood cells or hemoglobin in the blood (anemia) or having weak bones (bone disease). Symptoms develop slowly and may not be obvious until the kidney damage becomes severe. It is possible to have kidney disease for years without having symptoms. How is this diagnosed? This condition may be diagnosed based on:  Blood tests.  Urine tests.  Imaging tests, such as an ultrasound or a CT scan.  A kidney biopsy. This involves removing a sample of kidney tissue to be looked at under a microscope. Results from these tests will help to determine how serious  the CKD is. How is this treated? There is no cure for most cases of this condition, but treatment usually relieves symptoms and prevents or slows the worsening of the disease. Treatment may include:  Diet changes, which may require you to avoid alcohol and foods that are high in salt, potassium, phosphorous, and  protein.  Medicines. These may: ? Lower blood pressure. ? Control blood sugar (glucose). ? Relieve anemia. ? Relieve swelling. ? Protect your bones. ? Improve the balance of salts and minerals in your blood (electrolytes).  Dialysis, which is a type of treatment that removes toxic waste from the body. It may be needed if you have kidney failure.  Managing any other conditions that are causing your CKD or making it worse. Follow these instructions at home: Medicines  Take over-the-counter and prescription medicines only as told by your health care provider. The amount of some medicines that you take may need to be changed.  Do not take any new medicines unless approved by your health care provider. Many medicines can make kidney damage worse.  Do not take any vitamin and mineral supplements unless approved by your health care provider. Many nutritional supplements can make kidney damage worse. Lifestyle  Do not use any products that contain nicotine or tobacco, such as cigarettes, e-cigarettes, and chewing tobacco. If you need help quitting, ask your health care provider.  If you drink alcohol: ? Limit how much you use to:  0-1 drink a day for women who are not pregnant.  0-2 drinks a day for men. ? Know how much alcohol is in your drink. In the [place] one drink equals one 12 oz bottle of beer (355 mL), one 5 oz glass of wine (148 mL), or one 1 oz glass of hard liquor (44 mL).  Maintain a healthy weight. If you need help, ask your health care provider.   General instructions  Follow instructions from your health care provider about eating or drinking restrictions, including any prescribed diet.  Track your blood pressure at home. Report changes in your blood pressure as told.  If you are being treated for diabetes, track your blood glucose levels as told.  Start or continue an exercise plan. Exercise at least 30 minutes a day, 5 days a week.  Keep your immunizations up to  date as told.  Keep all follow-up visits. This is important.   Where to find more information  American Association of Kidney Patients: BombTimer.gl  National Kidney Foundation: www.kidney.[place]: https://mathis.com/  Life Options: www.lifeoptions.org  Kidney School: www.kidneyschool.org Contact a health care provider if:  Your symptoms get worse.  You develop new symptoms. Get help right away if:  You develop symptoms of ESRD. These include: ? Headaches. ? Numbness in your hands or feet. ? Easy bruising. ? Frequent hiccups. ? Chest pain. ? Shortness of breath. ? Lack of menstrual periods, in women.  You have a fever.  You are producing less urine than usual.  You have pain or bleeding when you urinate or when you have a bowel movement. These symptoms may represent a serious problem that is an emergency. Do not wait to see if the symptoms will go away. Get medical help right away. Call your local emergency services (911 in the [place] Do not drive yourself to the hospital. Summary  Chronic kidney disease (CKD) occurs when the kidneys become damaged slowly over a long period of time.  The most common causes of this condition are diabetes and  high blood pressure (hypertension).  There is no cure for most cases of CKD, but treatment usually relieves symptoms and prevents or slows the worsening of the disease. Treatment may include a combination of lifestyle changes, medicines, and dialysis. This information is not intended to replace advice given to you by your health care provider. Make sure you discuss any questions you have with your health care provider. Document Revised: 08/09/2019 Document Reviewed: 08/09/2019 Elsevier Patient Education  2021 [place].  Prediabetes Eating Plan Prediabetes is a condition that causes blood sugar (glucose) levels to be higher than normal. This increases the risk for developing type 2 diabetes (type 2 diabetes  mellitus). Working with a health care provider or nutrition specialist (dietitian) to make diet and lifestyle changes can help prevent the onset of diabetes. These changes may help you:  Control your blood glucose levels.  Improve your cholesterol levels.  Manage your blood pressure. What are tips for following this plan? Reading food labels  Read food labels to check the amount of fat, salt (sodium), and sugar in prepackaged foods. Avoid foods that have: ? Saturated fats. ? Trans fats. ? Added sugars.  Avoid foods that have more than 300 milligrams (mg) of sodium per serving. Limit your sodium intake to less than 2,300 mg each day. Shopping  Avoid buying pre-made and processed foods.  Avoid buying drinks with added sugar. Cooking  Cook with olive oil. Do not use butter, lard, or ghee.  Bake, broil, grill, steam, or boil foods. Avoid frying. Meal planning  Work with your dietitian to create an eating plan that is right for you. This may include tracking how many calories you take in each day. Use a food diary, notebook, or mobile application to track what you eat at each meal.  Consider following a Mediterranean diet. This includes: ? Eating several servings of fresh fruits and vegetables each day. ? Eating fish at least twice a week. ? Eating one serving each day of whole grains, beans, nuts, and seeds. ? Using olive oil instead of other fats. ? Limiting alcohol. ? Limiting red meat. ? Using nonfat or low-fat dairy products.  Consider following a plant-based diet. This includes dietary choices that focus on eating mostly vegetables and fruit, grains, beans, nuts, and seeds.  If you have high blood pressure, you may need to limit your sodium intake or follow a diet such as the DASH (Dietary Approaches to Stop Hypertension) eating plan. The DASH diet aims to lower high blood pressure.   Lifestyle  Set weight loss goals with help from your health care team. It is recommended  that most people with prediabetes lose 7% of their body weight.  Exercise for at least 30 minutes 5 or more days a week.  Attend a support group or seek support from a mental health counselor.  Take over-the-counter and prescription medicines only as told by your health care provider. What foods are recommended? Fruits Berries. Bananas. Apples. Oranges. Grapes. Papaya. Mango. Pomegranate. Kiwi. Grapefruit. Cherries. Vegetables Lettuce. Spinach. Peas. Beets. Cauliflower. Cabbage. Broccoli. Carrots. Tomatoes. Squash. Eggplant. Herbs. Peppers. Onions. Cucumbers. Brussels sprouts. Grains Whole grains, such as whole-wheat or whole-grain breads, crackers, cereals, and pasta. Unsweetened oatmeal. Bulgur. Barley. Quinoa. Brown rice. Corn or whole-wheat flour tortillas or taco shells. Meats and other proteins Seafood. Poultry without skin. Lean cuts of pork and beef. Tofu. Eggs. Nuts. Beans. Dairy Low-fat or fat-free dairy products, such as yogurt, cottage cheese, and cheese. Beverages Water. Tea. Coffee. Sugar-free or diet  soda. Seltzer water. Low-fat or nonfat milk. Milk alternatives, such as soy or almond milk. Fats and oils Olive oil. Canola oil. Sunflower oil. Grapeseed oil. Avocado. Walnuts. Sweets and desserts Sugar-free or low-fat pudding. Sugar-free or low-fat ice cream and other frozen treats. Seasonings and condiments Herbs. Sodium-free spices. Mustard. Relish. Low-salt, low-sugar ketchup. Low-salt, low-sugar barbecue sauce. Low-fat or fat-free mayonnaise. The items listed above may not be a complete list of recommended foods and beverages. Contact a dietitian for more information. What foods are not recommended? Fruits Fruits canned with syrup. Vegetables Canned vegetables. Frozen vegetables with butter or cream sauce. Grains Refined white flour and flour products, such as bread, pasta, snack foods, and cereals. Meats and other proteins Fatty cuts of meat. Poultry with skin.  Breaded or fried meat. Processed meats. Dairy Full-fat yogurt, cheese, or milk. Beverages Sweetened drinks, such as iced tea and soda. Fats and oils Butter. Lard. Ghee. Sweets and desserts Baked goods, such as cake, cupcakes, pastries, cookies, and cheesecake. Seasonings and condiments Spice mixes with added salt. Ketchup. Barbecue sauce. Mayonnaise. The items listed above may not be a complete list of foods and beverages that are not recommended. Contact a dietitian for more information. Where to find more information  American Diabetes Association: www.diabetes.org Summary  You may need to make diet and lifestyle changes to help prevent the onset of diabetes. These changes can help you control blood sugar, improve cholesterol levels, and manage blood pressure.  Set weight loss goals with help from your health care team. It is recommended that most people with prediabetes lose 7% of their body weight.  Consider following a Mediterranean diet. This includes eating plenty of fresh fruits and vegetables, whole grains, beans, nuts, seeds, fish, and low-fat dairy, and using olive oil instead of other fats. This information is not intended to replace advice given to you by your health care provider. Make sure you discuss any questions you have with your health care provider. Document Revised: 08/03/2019 Document Reviewed: 08/03/2019 Elsevier Patient Education  2021 [place].  High Cholesterol  High cholesterol is a condition in which the blood has high levels of a white, waxy substance similar to fat (cholesterol). The liver makes all the cholesterol that the body needs. The human body needs small amounts of cholesterol to help build cells. A person gets extra or excess cholesterol from the food that he or she eats. The blood carries cholesterol from the liver to the rest of the body. If you have high cholesterol, deposits (plaques) may build up on the walls of your arteries. Arteries  are the blood vessels that carry blood away from your heart. These plaques make the arteries narrow and stiff. Cholesterol plaques increase your risk for heart attack and stroke. Work with your health care provider to keep your cholesterol levels in a healthy range. What increases the risk? The following factors may make you more likely to develop this condition:  Eating foods that are high in animal fat (saturated fat) or cholesterol.  Being overweight.  Not getting enough exercise.  A family history of high cholesterol (familial hypercholesterolemia).  Use of tobacco products.  Having diabetes. What are the signs or symptoms? There are no symptoms of this condition. How is this diagnosed? This condition may be diagnosed based on the results of a blood test.  If you are older than 74 years of age, your health care provider may check your cholesterol levels every 4-6 years.  You may be checked more often  if you have high cholesterol or other risk factors for heart disease. The blood test for cholesterol measures:  "Bad" cholesterol, or LDL cholesterol. This is the main type of cholesterol that causes heart disease. The desired level is less than 100 mg/dL.  "Good" cholesterol, or HDL cholesterol. HDL helps protect against heart disease by cleaning the arteries and carrying the LDL to the liver for processing. The desired level for HDL is 60 mg/dL or higher.  Triglycerides. These are fats that your body can store or burn for energy. The desired level is less than 150 mg/dL.  Total cholesterol. This measures the total amount of cholesterol in your blood and includes LDL, HDL, and triglycerides. The desired level is less than 200 mg/dL. How is this treated? This condition may be treated with:  Diet changes. You may be asked to eat foods that have more fiber and less saturated fats or added sugar.  Lifestyle changes. These may include regular exercise, maintaining a healthy weight,  and quitting use of tobacco products.  Medicines. These are given when diet and lifestyle changes have not worked. You may be prescribed a statin medicine to help lower your cholesterol levels. Follow these instructions at home: Eating and drinking  Eat a healthy, balanced diet. This diet includes: ? Daily servings of a variety of fresh, frozen, or canned fruits and vegetables. ? Daily servings of whole grain foods that are rich in fiber. ? Foods that are low in saturated fats and trans fats. These include poultry and fish without skin, lean cuts of meat, and low-fat dairy products. ? A variety of fish, especially oily fish that contain omega-3 fatty acids. Aim to eat fish at least 2 times a week.  Avoid foods and drinks that have added sugar.  Use healthy cooking methods, such as roasting, grilling, broiling, baking, poaching, steaming, and stir-frying. Do not fry your food except for stir-frying.   Lifestyle  Get regular exercise. Aim to exercise for a total of 150 minutes a week. Increase your activity level by doing activities such as gardening, walking, and taking the stairs.  Do not use any products that contain nicotine or tobacco, such as cigarettes, e-cigarettes, and chewing tobacco. If you need help quitting, ask your health care provider.   General instructions  Take over-the-counter and prescription medicines only as told by your health care provider.  Keep all follow-up visits as told by your health care provider. This is important. Where to find more information  American Heart Association: www.heart.org  National Heart, Lung, and Blood Institute: https://wilson-eaton.com/ Contact a health care provider if:  You have trouble achieving or maintaining a healthy diet or weight.  You are starting an exercise program.  You are unable to stop smoking. Get help right away if:  You have chest pain.  You have trouble breathing.  You have any symptoms of a stroke. "BE FAST" is  an easy way to remember the main warning signs of a stroke: ? B - Balance. Signs are dizziness, sudden trouble walking, or loss of balance. ? E - Eyes. Signs are trouble seeing or a sudden change in vision. ? F - Face. Signs are sudden weakness or numbness of the face, or the face or eyelid drooping on one side. ? A - Arms. Signs are weakness or numbness in an arm. This happens suddenly and usually on one side of the body. ? S - Speech. Signs are sudden trouble speaking, slurred speech, or trouble understanding what people say. ?  T - Time. Time to call emergency services. Write down what time symptoms started.  You have other signs of a stroke, such as: ? A sudden, severe headache with no known cause. ? Nausea or vomiting. ? Seizure. These symptoms may represent a serious problem that is an emergency. Do not wait to see if the symptoms will go away. Get medical help right away. Call your local emergency services (911 in the [place] Do not drive yourself to the hospital. Summary  Cholesterol plaques increase your risk for heart attack and stroke. Work with your health care provider to keep your cholesterol levels in a healthy range.  Eat a healthy, balanced diet, get regular exercise, and maintain a healthy weight.  Do not use any products that contain nicotine or tobacco, such as cigarettes, e-cigarettes, and chewing tobacco.  Get help right away if you have any symptoms of a stroke. This information is not intended to replace advice given to you by your health care provider. Make sure you discuss any questions you have with your health care provider. Document Revised: 04/03/2019 Document Reviewed: 04/03/2019 Elsevier Patient Education  2021 [employer].

## 2020-10-10 NOTE — Progress Notes (Unsigned)
Patient enrolled for [employer] to mail a 14 day ZIO XT monitor to her home. 

## 2020-10-10 NOTE — Assessment & Plan Note (Signed)
History of essential hypertension a blood pressure measured today at 130/74.  She is on amlodipine.  She was on low-dose Benicar in the past.

## 2020-10-10 NOTE — Patient Instructions (Signed)
Medication Instructions:  Your physician recommends that you continue on your current medications as directed. Please refer to the Current Medication list given to you today.  *If you need a refill on your cardiac medications before your next appointment, please call your pharmacy*   Testing/Procedures: Your physician has requested that you have an echocardiogram. Echocardiography is a painless test that uses sound waves to create images of your heart. It provides your doctor with information about the size and shape of your heart and how well your heart's chambers and valves are working. This procedure takes approximately one hour. There are no restrictions for this procedure. This procedure is done at [address] Monitor Instructions   Your physician has requested you wear a ZIO patch monitor for 14 days.  This is a single patch monitor.   [employer] supplies one patch monitor per enrollment. Additional stickers are not available. Please do not apply patch if you will be having a Nuclear Stress Test, Echocardiogram, Cardiac CT, MRI, or Chest Xray during the period you would be wearing the monitor. The patch cannot be worn during these tests. You cannot remove and re-apply the ZIO XT patch monitor.  Your ZIO patch monitor will be sent Fed Ex from [employer] directly to your home address. It may take 3-5 days to receive your monitor after you have been enrolled.  Once you have received your monitor, please review the enclosed instructions. Your monitor has already been registered assigning a specific monitor serial # to you.  Billing and Patient Assistance Program Information   We have supplied [employer] with any of your insurance information on file for billing purposes. [employer] offers a sliding scale Patient Assistance Program for patients that do not have insurance, or whose insurance does not completely cover the cost of the ZIO monitor.   You must  apply for the Patient Assistance Program to qualify for this discounted rate.     To apply, please call [employer] at [phone], select option 4, then select option 2, and ask to apply for Patient Assistance Program.  Theodore Demark will ask your household income, and how many people are in your household.  They will quote your out-of-pocket cost based on that information.  [employer] will also be able to set up a 11-month, interest-free payment plan if needed.  Applying the monitor   Shave hair from upper left chest.  Hold abrader disc by orange tab. Rub abrader in 40 strokes over the upper left chest as indicated in your monitor instructions.  Clean area with 4 enclosed alcohol pads. Let dry.  Apply patch as indicated in monitor instructions. Patch will be placed under collarbone on left side of chest with arrow pointing upward.  Rub patch adhesive wings for 2 minutes. Remove white label marked "1". Remove the white label marked "2". Rub patch adhesive wings for 2 additional minutes.  While looking in a mirror, press and release button in center of patch. A small green light will flash 3-4 times. This will be your only indicator that the monitor has been turned on. ?   Do not shower for the first 24 hours. You may shower after the first 24 hours.   Press the button if you feel a symptom. You will hear a small click. Record Date, Time and Symptom in the Patient Logbook.  When you are ready to remove the patch, follow instructions on the last 2 pages of the Patient Logbook. Stick  patch monitor onto the last page of Patient Logbook.  Place Patient Logbook in the blue and white box.  Use locking tab on box and tape box closed securely.  The blue and white box has prepaid postage on it. Please place it in the mailbox as soon as possible. Your physician should have your test results approximately 7 days after the monitor has been mailed back to [facility].  Call [place] at [phone] if  you have questions regarding your ZIO XT patch monitor. Call them immediately if you see an orange light blinking on your monitor.  If your monitor falls off in less than 4 days, contact our Monitor department at [phone]. ?If your monitor becomes loose or falls off after 4 days call [employer] at [phone] for suggestions on securing your monitor.?   Follow-Up: At [facility], you and your health needs are our priority.  As part of our continuing mission to provide you with exceptional heart care, we have created designated Provider Care Teams.  These Care Teams include your primary Cardiologist (physician) and Advanced Practice Providers (APPs -  Physician Assistants and Nurse Practitioners) who all work together to provide you with the care you need, when you need it.  We recommend signing up for the patient portal called "MyChart".  Sign up information is provided on this After Visit Summary.  MyChart is used to connect with patients for Virtual Visits (Telemedicine).  Patients are able to view lab/test results, encounter notes, upcoming appointments, etc.  Non-urgent messages can be sent to your provider as well.   To learn more about what you can do with MyChart, go to [email or web address].    Your next appointment:   3 month(s)  The format for your next appointment:   In Person  Provider:   Quay Burow, MD

## 2020-10-10 NOTE — Progress Notes (Signed)
10/10/2020 Angela Pugh   05-07-1946  [id number]  Primary Physician [name], MD Primary Cardiologist: Lorretta Harp MD [name], [place]  HPI:  Angela Pugh is a 75 y.o.  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 She was referred by Dr. Volanda Napoleon for cardiovascular evaluation because of newly recognized left bundle branch block and chest pain.I last spoke to her on the phone for a virtual telemedicine phone visit 10/11/2018.  She is accompanied by her daughter [name] today. She does have a history of hypertension and diet-controlled diabetes. She is never had a heart attack or stroke. She has had recent carotid Dopplers performed in May revealing moderate bilateral ICA stenosis. She had a newly recognized left bundle branch block as well. She is had chest pain for several years which has increased in frequency and severity now occurring on a daily basis. Is no relation to food or activity.  A CT FFR performed 03/04/2018 showed a physiologically significant lesion in the proximal LAD with an FFR of 0.78. Based on this, we decided to proceed with outpatient diagnostic radial cardiac catheterization.  This was performed 03/17/2018 revealing normal coronary arteries and moderate LV dysfunction with an EF of 40%.  I did refer her to Dr. Haroldine Laws who did a cardiac MRI that showed no evidence of myocarditis and an event monitor that was normal as well.  She has a nonischemic cardiomyopathy with an EF in the 35 to 40% range.  She was on low-dose Benicar amlodipine.  She gets episodic episodes of breath and atypical chest pain that improves with Gas-X.  Since I spoke to her on the phone 2 years ago she is complained of episodes of profound weakness and shortness of breath as well as slow heart rate.   Current Meds  Medication Sig  . amLODipine (NORVASC) 5 MG tablet TAKE 1  TABLET(5 MG) BY MOUTH DAILY  . Aromatic Inhalants (VICKS VAPOR INHALER IN) Inhale 1 puff into the lungs as needed.  Marland Kitchen aspirin 81 MG EC tablet Take 81 mg by mouth daily. Swallow whole.  . Calcium Carb-Cholecalciferol (CALCIUM 1000 + D PO)   . famotidine (PEPCID) 20 MG tablet Take 1 tablet (20 mg total) by mouth 3 times/day as needed-between meals & bedtime for heartburn or indigestion.  . fexofenadine (ALLEGRA ALLERGY) 180 MG tablet Take 1 tablet (180 mg total) by mouth daily.  . hyoscyamine (LEVSIN SL) 0.125 MG SL tablet Take 1 to 2 tablets under the tongue as needed for cramping and abdominal bloating.  . mesalamine (LIALDA) 1.2 g EC tablet Take 4 tablets (4.8 g total) by mouth daily with breakfast.  . Multiple Vitamins-Minerals (CENTRUM ADULTS PO) Take 1 tablet by mouth daily.   . pantoprazole (PROTONIX) 40 MG tablet Take 1 tablet (40 mg total) by mouth daily.  Marland Kitchen triamcinolone cream (KENALOG) 0.5 % APPLY EXTERNALLY TO THE AFFECTED AREA TWICE DAILY AS NEEDED FOR DRY SKIN     Allergies  Allergen Reactions  . Acetaminophen Hives and Nausea Only  . Atorvastatin Nausea Only    weakness  . Azithromycin Hives and Nausea And Vomiting  . Zyrtec [Cetirizine]     Hives     Social History   Socioeconomic History  . Marital status: Widowed    Spouse name: Not on file  . Number of children: Not on file  . Years of education: Not on file  .  Highest education level: Not on file  Occupational History  . Not on file  Tobacco Use  . Smoking status: Former Smoker    Packs/day: 0.50    Years: 20.00    Pack years: 10.00    Types: Cigarettes    Quit date: 06/30/1982    Years since quitting: 38.3  . Smokeless tobacco: Never Used  Vaping Use  . Vaping Use: Never used  Substance and Sexual Activity  . Alcohol use: Not Currently    Comment: 05/04/2016 "might have a drink a couple days/year; might not"  . Drug use: No  . Sexual activity: Not Currently  Other Topics Concern  . Not on file   Social History Narrative  . Not on file   Social Determinants of Health   Financial Resource Strain: Low Risk   . Difficulty of Paying Living Expenses: Not hard at all  Food Insecurity: No Food Insecurity  . Worried About Charity fundraiser in the Last Year: Never true  . Ran Out of Food in the Last Year: Never true  Transportation Needs: No Transportation Needs  . Lack of Transportation (Medical): No  . Lack of Transportation (Non-Medical): No  Physical Activity: Inactive  . Days of Exercise per Week: 0 days  . Minutes of Exercise per Session: 0 min  Stress: No Stress Concern Present  . Feeling of Stress : Not at all  Social Connections: Moderately Isolated  . Frequency of Communication with Friends and Family: More than three times a week  . Frequency of Social Gatherings with Friends and Family: Once a week  . Attends Religious Services: More than 4 times per year  . Active Member of Clubs or Organizations: No  . Attends Archivist Meetings: Never  . Marital Status: Widowed  Intimate Partner Violence: Not At Risk  . Fear of Current or Ex-Partner: No  . Emotionally Abused: No  . Physically Abused: No  . Sexually Abused: No     Review of Systems: General: negative for chills, fever, night sweats or weight changes.  Cardiovascular: negative for chest pain, dyspnea on exertion, edema, orthopnea, palpitations, paroxysmal nocturnal dyspnea or shortness of breath Dermatological: negative for rash Respiratory: negative for cough or wheezing Urologic: negative for hematuria Abdominal: negative for nausea, vomiting, diarrhea, bright red blood per rectum, melena, or hematemesis Neurologic: negative for visual changes, syncope, or dizziness All other systems reviewed and are otherwise negative except as noted above.    Blood pressure 130/74, pulse 61, height 5\' 9"  (1.753 m), weight 165 lb 9.6 oz (75.1 kg).  General appearance: alert and no distress Neck: no  adenopathy, no carotid bruit, no JVD, supple, symmetrical, trachea midline and thyroid not enlarged, symmetric, no tenderness/mass/nodules Lungs: clear to auscultation bilaterally Heart: regular rate and rhythm, S1, S2 normal, no murmur, click, rub or gallop Extremities: extremities normal, atraumatic, no cyanosis or edema Pulses: 2+ and symmetric Skin: Skin color, texture, turgor normal. No rashes or lesions Neurologic: Alert and oriented X 3, normal strength and tone. Normal symmetric reflexes. Normal coordination and gait  EKG sinus rhythm at 61 with left bundle branch block.  I personally reviewed this EKG.  ASSESSMENT AND PLAN:   Hypertension History of essential hypertension a blood pressure measured today at 130/74.  She is on amlodipine.  She was on low-dose Benicar in the past.  HLD (hyperlipidemia) History of hyperlipidemia currently not on statin therapy with lipid profile performed 07/13/2019 revealing total cholesterol 231, LDL of 158 and HDL  of 54.  Left bundle branch block Chronic  Nonischemic cardiomyopathy ([place]) History of nonischemic cardia cardiomyopathy with an EF in the 40% range.  I had referred her to Dr. Haroldine Laws in the past.  She complains of shortness of breath and lethargy as well as bradycardia.  She is not on beta-blocker because her heart rates in the 50s to 60s.  She may be a candidate for CRT device.  I am going to recheck a 2D echocardiogram and a 2-week event monitor I will see her back after that for further recommendations.      Lorretta Harp MD FACP,FACC,FAHA, [facility] 10/10/2020 9:39 AM

## 2020-10-10 NOTE — Assessment & Plan Note (Signed)
History of nonischemic cardia cardiomyopathy with an EF in the 40% range.  I had referred her to Dr. Haroldine Laws in the past.  She complains of shortness of breath and lethargy as well as bradycardia.  She is not on beta-blocker because her heart rates in the 50s to 60s.  She may be a candidate for CRT device.  I am going to recheck a 2D echocardiogram and a 2-week event monitor I will see her back after that for further recommendations.

## 2020-10-10 NOTE — Assessment & Plan Note (Signed)
History of hyperlipidemia currently not on statin therapy with lipid profile performed 07/13/2019 revealing total cholesterol 231, LDL of 158 and HDL of 54.

## 2020-10-14 DIAGNOSIS — R001 Bradycardia, unspecified: Secondary | ICD-10-CM

## 2020-10-14 DIAGNOSIS — I447 Left bundle-branch block, unspecified: Secondary | ICD-10-CM

## 2020-10-14 DIAGNOSIS — I428 Other cardiomyopathies: Secondary | ICD-10-CM | POA: Diagnosis not present

## 2020-10-18 ENCOUNTER — Encounter: Payer: Self-pay | Admitting: Family Medicine

## 2020-10-22 ENCOUNTER — Telehealth: Payer: Self-pay | Admitting: Podiatry

## 2020-10-22 NOTE — Telephone Encounter (Signed)
Faxed documents for diabetic shoes to Dr Volanda Napoleon with note to get the documents updated as last one shoes last office visit was in 2021

## 2020-10-31 DIAGNOSIS — I428 Other cardiomyopathies: Secondary | ICD-10-CM | POA: Diagnosis not present

## 2020-10-31 DIAGNOSIS — R001 Bradycardia, unspecified: Secondary | ICD-10-CM | POA: Diagnosis not present

## 2020-10-31 DIAGNOSIS — I447 Left bundle-branch block, unspecified: Secondary | ICD-10-CM | POA: Diagnosis not present

## 2020-11-11 ENCOUNTER — Other Ambulatory Visit: Payer: Self-pay

## 2020-11-11 ENCOUNTER — Ambulatory Visit (HOSPITAL_COMMUNITY): Payer: Medicare Other | Attending: Cardiovascular Disease

## 2020-11-11 ENCOUNTER — Encounter: Payer: Self-pay | Admitting: *Deleted

## 2020-11-11 DIAGNOSIS — E782 Mixed hyperlipidemia: Secondary | ICD-10-CM | POA: Insufficient documentation

## 2020-11-11 DIAGNOSIS — R001 Bradycardia, unspecified: Secondary | ICD-10-CM | POA: Diagnosis not present

## 2020-11-11 DIAGNOSIS — I10 Essential (primary) hypertension: Secondary | ICD-10-CM | POA: Insufficient documentation

## 2020-11-11 DIAGNOSIS — I447 Left bundle-branch block, unspecified: Secondary | ICD-10-CM | POA: Diagnosis not present

## 2020-11-11 DIAGNOSIS — I428 Other cardiomyopathies: Secondary | ICD-10-CM | POA: Insufficient documentation

## 2020-11-11 LAB — ECHOCARDIOGRAM COMPLETE
Area-P 1/2: 1.7 cm2
P 1/2 time: 1103 msec
S' Lateral: 4 cm

## 2020-11-12 ENCOUNTER — Ambulatory Visit (INDEPENDENT_AMBULATORY_CARE_PROVIDER_SITE_OTHER): Payer: Medicare Other | Admitting: Podiatry

## 2020-11-12 ENCOUNTER — Encounter: Payer: Self-pay | Admitting: Podiatry

## 2020-11-12 DIAGNOSIS — M79675 Pain in left toe(s): Secondary | ICD-10-CM

## 2020-11-12 DIAGNOSIS — Q828 Other specified congenital malformations of skin: Secondary | ICD-10-CM

## 2020-11-12 DIAGNOSIS — M79674 Pain in right toe(s): Secondary | ICD-10-CM | POA: Diagnosis not present

## 2020-11-12 DIAGNOSIS — E1151 Type 2 diabetes mellitus with diabetic peripheral angiopathy without gangrene: Secondary | ICD-10-CM

## 2020-11-12 DIAGNOSIS — B351 Tinea unguium: Secondary | ICD-10-CM | POA: Diagnosis not present

## 2020-11-14 NOTE — Progress Notes (Signed)
  Subjective:  Patient ID: Angela Pugh, female    DOB: [birth year],  MRN: [id number]  75 y.o. female presents with preventative diabetic foot care and painful porokeratotic lesion(s) b/l feet and painful mycotic toenails that limit ambulation. Aggravating factors include weightbearing with and without shoe gear. Pain for both is relieved with periodic professional debridement.   Patient states her left great toe porokeratosis is most painful on today's visit. She states Dr. Amalia Hailey always applied medication to her lesions and she would experience less pain between visits.   She would like to know status of her diabetic shoes.   PCP is Dr. Grier Mitts and last visit was 10/10/2020  Review of Systems: Negative except as noted in the HPI. Denies N/V/F/Ch.  Allergies  Allergen Reactions   Acetaminophen Hives and Nausea Only   Atorvastatin Nausea Only    weakness   Azithromycin Hives and Nausea And Vomiting   Zyrtec [Cetirizine]     Hives    Objective:  There were no vitals filed for this visit. Constitutional Patient is a pleasant 75 y.o. African American female, WD, WN in NAD.Marland Kitchen  Vascular Neurovascular status unchanged b/l lower extremities. Capillary refill time to digits immediate b/l. Palpable DP pulse(s) b/l lower extremities Nonpalpable PT pulse(s) b/l lower extremities. Pedal hair sparse. Lower extremity skin temperature gradient within normal limits. No pain with calf compression b/l. No edema noted b/l lower extremities. No cyanosis or clubbing noted.  Neurologic Normal speech. Protective sensation intact 5/5 intact bilaterally with 10g monofilament b/l. Vibratory sensation intact b/l. Proprioception intact bilaterally. Clonus negative b/l.  Dermatologic Pedal skin with normal turgor, texture and tone bilaterally. No open wounds bilaterally. No interdigital macerations bilaterally. Toenails 1-5 b/l elongated, discolored, dystrophic, thickened, crumbly with subungual debris and  tenderness to dorsal palpation. Porokeratotic lesion(s) plantar IPJ L hallux, R hallux, submet head 2 left foot, submet head 2 right foot. No erythema, no edema, no drainage, no flocculence.  Orthopedic: Normal muscle strength 5/5 to all lower extremity muscle groups bilaterally. No pain crepitus or joint limitation noted with ROM b/l. Hammertoes noted to the L 5th toe and R 5th toe.   Radiographs: None Assessment:   1. Pain due to onychomycosis of toenails of both feet   2. Porokeratosis   3. Type II diabetes mellitus with peripheral circulatory disorder (HCC)    Plan:  -No new orders on today's visit. -Continue diabetic foot care principles. -Toenails 1-5 bilaterally debrided in length and girth with sterile nail nippers and dremel tool without incident. -Continue soft, supportive shoe gear daily -Painful porokeratotic lesion(s) submet head 2 left foot, submet head 2 right foot, b/l hallux pared and enucleated with sterile scalpel blade and sterile #5 currette without incident. # of lesions=4. -Patient/POA to call should there be any questions/concerns in the interim. -Report any pedal problems to medical professional immediately. -Patient/POA to contact office if there are any problems in the interim.  FOLLOW UP:  Return in about 3 months (around 02/12/2021).  Marzetta Board, DPM

## 2020-12-03 ENCOUNTER — Other Ambulatory Visit: Payer: Self-pay

## 2020-12-04 ENCOUNTER — Encounter: Payer: Self-pay | Admitting: Family Medicine

## 2020-12-04 ENCOUNTER — Ambulatory Visit (INDEPENDENT_AMBULATORY_CARE_PROVIDER_SITE_OTHER): Payer: Medicare Other | Admitting: Family Medicine

## 2020-12-04 ENCOUNTER — Encounter: Payer: Self-pay | Admitting: Gastroenterology

## 2020-12-04 ENCOUNTER — Ambulatory Visit (INDEPENDENT_AMBULATORY_CARE_PROVIDER_SITE_OTHER): Payer: Medicare Other | Admitting: Gastroenterology

## 2020-12-04 ENCOUNTER — Other Ambulatory Visit: Payer: Medicare Other

## 2020-12-04 VITALS — BP 148/78 | HR 68 | Ht 69.0 in | Wt 165.0 lb

## 2020-12-04 VITALS — BP 138/78 | HR 56 | Temp 98.1°F | Wt 165.8 lb

## 2020-12-04 DIAGNOSIS — R42 Dizziness and giddiness: Secondary | ICD-10-CM

## 2020-12-04 DIAGNOSIS — H6983 Other specified disorders of Eustachian tube, bilateral: Secondary | ICD-10-CM

## 2020-12-04 DIAGNOSIS — R197 Diarrhea, unspecified: Secondary | ICD-10-CM | POA: Diagnosis not present

## 2020-12-04 DIAGNOSIS — H6501 Acute serous otitis media, right ear: Secondary | ICD-10-CM | POA: Diagnosis not present

## 2020-12-04 MED ORDER — AMOXICILLIN 500 MG PO TABS: 500.0000 mg | ORAL_TABLET | Freq: Two times a day (BID) | ORAL | 0 refills | Status: AC

## 2020-12-04 NOTE — Patient Instructions (Signed)
Nutribiotic ear drops with grapefruit seed extract and tea tree oil.

## 2020-12-04 NOTE — Patient Instructions (Signed)
If you are age 74 or older, your body mass index should be between 23-30. Your Body mass index is 24.37 kg/m. If this is out of the aforementioned range listed, please consider follow up with your Primary Care Provider. _________________________________________________________  The [facility] providers would like to encourage you to use [facility] to communicate with providers for non-urgent requests or questions.  Due to long hold times on the telephone, sending your provider a message by [facility] may be a faster and more efficient way to get a response.  Please allow 48 business hours for a response.  Please remember that this is for non-urgent requests.  _________________________________________________________  Your provider has requested that you go to the basement level for lab work before leaving today. Press "B" on the elevator. The lab is located at the first door on the left as you exit the elevator.  Due to recent changes in healthcare laws, you may see the results of your imaging and laboratory studies on MyChart before your provider has had a chance to review them.  We understand that in some cases there may be results that are confusing or concerning to you. Not all laboratory results come back in the same time frame and the provider may be waiting for multiple results in order to interpret others.  Please give [place] 48 hours in order for your provider to thoroughly review all the results before contacting the office for clarification of your results.   Thank you for entrusting me with your care and choosing [facility].  Dr Ardis Hughs

## 2020-12-04 NOTE — Progress Notes (Signed)
Subjective:    Patient ID: Angela Pugh, female    DOB: Apr 09, 1946, 75 y.o.   MRN: [id number]  Chief Complaint  Patient presents with   Ear Pain    Staes ears feel stopped up and she has been losing her balance.    HPI Patient was seen today for ongoing concern.  Pt with sensation of ears being stopped up x 2 wks.  Popping in ears and dizziness.  At times pt feels off balance.  Denies n, v, HA, changes in vision, changes in meds.     Past Medical History:  Diagnosis Date   Arthritis    back, knees, shoulder, hands , injections in pelvis, for bone spurs (05/04/2016)   Chronic back pain    Diverticulosis    History of kidney stones    HLD (hyperlipidemia)    Hypertension    Migraine    hx (05/04/2016)   Peripheral neuropathy    Radiculopathy    Sinus complaint    Sinus headache    occasional (05/04/2016)   Type II diabetes mellitus (HCC)    diet controlled, no meds now, was on insulin & po treatment at one time     Vertigo     Allergies  Allergen Reactions   Acetaminophen Hives and Nausea Only   Atorvastatin Nausea Only    weakness   Azithromycin Hives and Nausea And Vomiting   Zyrtec [Cetirizine]     Hives     ROS General: Denies fever, chills, night sweats, changes in weight, changes in appetite +dizziness HEENT: Denies headaches, ear pain, changes in vision, rhinorrhea, sore throat +popping in ears and "stopped up" feeling in ears. CV: Denies CP, palpitations, SOB, orthopnea Pulm: Denies SOB, cough, wheezing GI: Denies abdominal pain, nausea, vomiting, diarrhea, constipation GU: Denies dysuria, hematuria, frequency, vaginal discharge Msk: Denies muscle cramps, joint pains Neuro: Denies weakness, numbness, tingling Skin: Denies rashes, bruising Psych: Denies depression, anxiety, hallucinations    Objective:    Blood pressure 138/78, pulse (!) 56, temperature 98.1 F (36.7 C), temperature source Oral, weight 165 lb 12.8 oz (75.2 kg), SpO2 98 %.  Gen.  Pleasant, well-nourished, in no distress, normal affect   HEENT: [place]/AT, face symmetric, conjunctiva clear, no scleral icterus, PERRLA, EOMI, no nystagmus, nares patent without drainage, pharynx without erythema or exudate.  TMs flat, dull.  R TM with subtle fluid level.  TTP of R eustachian tube. Lungs: no accessory muscle use, CTAB, no wheezes or rales Cardiovascular: bradycardia no m/r/g, no peripheral edema Musculoskeletal: No deformities, no cyanosis or clubbing, normal tone Neuro:  A&Ox3, CN II-XII intact, normal gait Skin:  Warm, no lesions/ rash   Wt Readings from Last 3 Encounters:  12/04/20 165 lb 12.8 oz (75.2 kg)  12/04/20 165 lb (74.8 kg)  10/10/20 165 lb 12.8 oz (75.2 kg)    Lab Results  Component Value Date   WBC 5.5 10/03/2020   HGB 14.1 10/03/2020   HCT 41.0 10/03/2020   PLT 313.0 10/03/2020   GLUCOSE 114 (H) 10/03/2020   CHOL 231 (H) 07/13/2019   TRIG 94.0 07/13/2019   HDL 54.30 07/13/2019   LDLCALC 158 (H) 07/13/2019   ALT 10 10/03/2020   AST 13 10/03/2020   NA 140 10/03/2020   K 4.5 10/03/2020   CL 101 10/03/2020   CREATININE 1.04 10/03/2020   BUN 14 10/03/2020   CO2 32 10/03/2020   TSH 1.19 10/03/2020   INR 1.06 01/16/2015   HGBA1C 6.3 10/03/2020  Assessment/Plan:  Dysfunction of both eustachian tubes -restart Allegra -consider nasal saline rinse or flonase.  Dizziness -likely 2/2 R AOM. -also consider vertigo -can also try OTC nutribiotic ear gtts  -f/u for continue sx s/p abx course  Right acute serous otitis media, recurrence not specified  -supportive care including tylenol prn for discomfort - Plan: amoxicillin (AMOXIL) 500 MG tablet  F/u prn  Grier Mitts, MD

## 2020-12-04 NOTE — Progress Notes (Signed)
Review of pertinent gastrointestinal problems: 1.  Generalized abdominal pain led to a variety of tests 2021 including CT scan November 2021 showed thick walled sigmoid colon without diverticulitis.  Pancreatic atrophy with mild dilation of the main PD.  MRI with MRCP November 2021 showed "stable mild dorsal pancreatic duct dilation, no change since 2012."  EGD December 2021 which showed blue blood type vascular findings in the distal esophagus, mild gastritis, negative for H. pylori on biopsy also colonoscopy December 2021 that was complete to the cecum.  Multiple diverticulosis in the left colon. There was a significant diverticular associated luminal narrowing spasticity and erythema in the left colon and I felt it was possible that that was the source of her abdominal discomforts.  She was started on Lialda 4 pills once daily.  This regimen significantly improved her abdominal pains, decreased her bloating.  Moving her bowels regularly.  Very likely she has "diverticular associated colitis" 2.  Chronic GERD without alarm symptoms.  EGD December 2021.  Proton pump inhibitor once daily is effective at controlling her symptoms.  HPI: This is a very pleasant 75 year old woman whom I last saw about 3 months ago  I last saw her about 3 months ago here in the office and she was already starting to improve overall on Lialda 4 pills once daily.  Blood work May 2022 CBC was normal, complete metabolic profile was normal.  TSH was normal.  Since then she has been quite good about taking her mesalamine 4 pills once daily and the lower abdominal pains are nearly completely resolved.  She has however noticed a change in the past 1 month.  She is having urgency and frequency and intermittent loose nonbloody stools.  This is definitely different than her previous bowel habits.  She was put on antibiotics for some type of ear infection about 1 month before this change in her bowel habits.  She has no fevers or chills  no significant abdominal pains.  ROS: complete GI ROS as described in HPI, all other review negative.  Constitutional:  No unintentional weight loss   Past Medical History:  Diagnosis Date   Arthritis    back, knees, shoulder, hands , injections in pelvis, for bone spurs (05/04/2016)   Chronic back pain    Diverticulosis    History of kidney stones    HLD (hyperlipidemia)    Hypertension    Migraine    hx (05/04/2016)   Peripheral neuropathy    Radiculopathy    Sinus complaint    Sinus headache    occasional (05/04/2016)   Type II diabetes mellitus ([place])    diet controlled, no meds now, was on insulin & po treatment at one time     Vertigo     Past Surgical History:  Procedure Laterality Date   ANTERIOR CERVICAL DECOMP/DISCECTOMY FUSION N/A 04/17/2016   Procedure: ANTERIOR CERVICAL DECOMPRESSION/DISCECTOMY FUSION CERVICAL THREE - CERVICAL FOUR, POSTERIOR CERVICAL DISCECTOMY CERVICAL SEVEN -THORASIC ONE LEFT;  Surgeon: Ashok Pall, MD;  Location: [facility];  Service: Neurosurgery;  Laterality: N/A;  Anterior/Posterior   ANTERIOR FUSION CERVICAL SPINE  2000   Dr. Carloyn Manner; "for bone spurs; put hardware in too"   BACK SURGERY     COLONOSCOPY  06/10/2016   [facility]   LEFT HEART CATH AND CORONARY ANGIOGRAPHY N/A 03/17/2018   Procedure: LEFT HEART CATH AND CORONARY ANGIOGRAPHY;  Surgeon: Lorretta Harp, MD;  Location: [facility];  Service: Cardiovascular;  Laterality: N/A;   LUMBAR LAMINECTOMY Left  06/30/2013   Procedure: Left L3-4 Extraforaminal approach to excise far lateral herniated nucleus pulposus;  Surgeon: Jessy Oto, MD;  Location: [facility];  Service: Orthopedics;  Laterality: Left;   TUBAL LIGATION     VAGINAL HYSTERECTOMY      Current Outpatient Medications  Medication Sig Dispense Refill   amLODipine (NORVASC) 5 MG tablet TAKE 1 TABLET(5 MG) BY MOUTH DAILY 15 tablet 0   Aromatic Inhalants (VICKS VAPOR INHALER IN) Inhale 1 puff into the lungs as needed.     aspirin  81 MG EC tablet Take 81 mg by mouth daily. Swallow whole.     Calcium Carb-Cholecalciferol (CALCIUM 1000 + D PO)      famotidine (PEPCID) 20 MG tablet Take 1 tablet (20 mg total) by mouth 3 times/day as needed-between meals & bedtime for heartburn or indigestion. 30 tablet 0   fexofenadine (ALLEGRA ALLERGY) 180 MG tablet Take 1 tablet (180 mg total) by mouth daily. 30 tablet 3   hyoscyamine (LEVSIN SL) 0.125 MG SL tablet Take 1 to 2 tablets under the tongue as needed for cramping and abdominal bloating. 50 tablet 3   mesalamine (LIALDA) 1.2 g EC tablet Take 4 tablets (4.8 g total) by mouth daily with breakfast. 360 tablet 3   Multiple Vitamins-Minerals (CENTRUM ADULTS PO) Take 1 tablet by mouth daily.      pantoprazole (PROTONIX) 40 MG tablet Take 1 tablet (40 mg total) by mouth daily. 90 tablet 3   potassium chloride SA (KLOR-CON) 20 MEQ tablet Take 20 mEq by mouth daily.     triamcinolone cream (KENALOG) 0.5 % APPLY EXTERNALLY TO THE AFFECTED AREA TWICE DAILY AS NEEDED FOR DRY SKIN 30 g 3   No current facility-administered medications for this visit.    Allergies as of 12/04/2020 - Review Complete 12/04/2020  Allergen Reaction Noted   Acetaminophen Hives and Nausea Only 07/02/2010   Atorvastatin Nausea Only 02/01/2016   Azithromycin Hives and Nausea And Vomiting 10/18/2017   Zyrtec [cetirizine]  08/25/2019    Family History  Problem Relation Age of Onset   Uterine cancer Mother    Diabetes type II Sister    Diabetes Mellitus II Brother    Colon cancer Neg Hx    Pancreatic cancer Neg Hx    Esophageal cancer Neg Hx    Colon polyps Neg Hx    Rectal cancer Neg Hx    Stomach cancer Neg Hx     Social History   Socioeconomic History   Marital status: Widowed    Spouse name: Not on file   Number of children: Not on file   Years of education: Not on file   Highest education level: Not on file  Occupational History   Not on file  Tobacco Use   Smoking status: Former     Packs/day: 0.50    Years: 20.00    Pack years: 10.00    Types: Cigarettes    Quit date: 06/30/1982    Years since quitting: 38.4   Smokeless tobacco: Never  Vaping Use   Vaping Use: Never used  Substance and Sexual Activity   Alcohol use: Not Currently    Comment: 05/04/2016 "might have a drink a couple days/year; might not"   Drug use: No   Sexual activity: Not Currently  Other Topics Concern   Not on file  Social History Narrative   Not on file   Social Determinants of Health   Financial Resource Strain: Low Risk    Difficulty  of Paying Living Expenses: Not hard at all  Food Insecurity: No Food Insecurity   Worried About [place] in the Last Year: Never true   [place] in the Last Year: Never true  Transportation Needs: No Transportation Needs   Lack of Transportation (Medical): No   Lack of Transportation (Non-Medical): No  Physical Activity: Inactive   Days of Exercise per Week: 0 days   Minutes of Exercise per Session: 0 min  Stress: No Stress Concern Present   Feeling of Stress : Not at all  Social Connections: Moderately Isolated   Frequency of Communication with Friends and Family: More than three times a week   Frequency of Social Gatherings with Friends and Family: Once a week   Attends Religious Services: More than 4 times per year   Active Member of [employer] or Organizations: No   Attends Archivist Meetings: Never   Marital Status: Widowed  Human resources officer Violence: Not At Risk   Fear of Current or Ex-Partner: No   Emotionally Abused: No   Physically Abused: No   Sexually Abused: No     Physical Exam: BP (!) 148/78   Pulse 68   Ht 5\' 9"  (1.753 m)   Wt 165 lb (74.8 kg)   BMI 24.37 kg/m  Constitutional: generally well-appearing Psychiatric: alert and oriented x3 Abdomen: soft, nontender, nondistended, no obvious ascites, no peritoneal signs, normal bowel sounds No peripheral edema noted in lower extremities  Assessment  and plan: 75 y.o. female with change in bowels, diverticular associated colitis  I think the recent change in her bowel habits is more likely related to some type of infectious illness then a worsening of her diverticular associated colitis however I am not [place] certain of that.  She was put on antibiotics for ear infection 1 month before change in her bowel habits.  I explained to her that this might put her at risk for C. difficile and I recommended GI pathogen panel of her stool.  She will continue with Lialda 4 pills once daily for now.  Please see the "Patient Instructions" section for addition details about the plan.  Owens Loffler, MD [facility] 12/04/2020, 8:57 AM   Total time on date of encounter was 35 minutes (this included time spent preparing to see the patient reviewing records; obtaining and/or reviewing separately obtained history; performing a medically appropriate exam and/or evaluation; counseling and educating the patient and family if present; ordering medications, tests or procedures if applicable; and documenting clinical information in the health record).

## 2020-12-05 ENCOUNTER — Other Ambulatory Visit: Payer: Medicare Other

## 2020-12-05 DIAGNOSIS — A048 Other specified bacterial intestinal infections: Secondary | ICD-10-CM | POA: Diagnosis not present

## 2020-12-05 DIAGNOSIS — R197 Diarrhea, unspecified: Secondary | ICD-10-CM | POA: Diagnosis not present

## 2020-12-08 LAB — GI PROFILE, STOOL, PCR

## 2020-12-16 ENCOUNTER — Telehealth: Payer: Self-pay | Admitting: Gastroenterology

## 2020-12-16 NOTE — Telephone Encounter (Signed)
The pt has been advised that her results have not been reviewed by Dr Ardis Hughs, however, I did let her know that I see no abnormalities on the test results.  She has been advised that we will call her once Dr Ardis Hughs is back in the office with results and any further recommendations if any.  The pt has been advised of the information and verbalized understanding.

## 2020-12-16 NOTE — Telephone Encounter (Signed)
Inbound call from patient requesting a call with her lab results please.

## 2020-12-24 ENCOUNTER — Encounter: Payer: Self-pay | Admitting: Family Medicine

## 2021-01-06 ENCOUNTER — Ambulatory Visit (INDEPENDENT_AMBULATORY_CARE_PROVIDER_SITE_OTHER): Payer: Medicare Other | Admitting: *Deleted

## 2021-01-06 ENCOUNTER — Other Ambulatory Visit: Payer: Self-pay

## 2021-01-06 DIAGNOSIS — E1151 Type 2 diabetes mellitus with diabetic peripheral angiopathy without gangrene: Secondary | ICD-10-CM

## 2021-01-06 DIAGNOSIS — M2041 Other hammer toe(s) (acquired), right foot: Secondary | ICD-10-CM

## 2021-01-06 DIAGNOSIS — M2042 Other hammer toe(s) (acquired), left foot: Secondary | ICD-10-CM | POA: Diagnosis not present

## 2021-01-06 NOTE — Progress Notes (Signed)
Patient presents today to pick up diabetic shoes and insoles.  Patient was dispensed 1 pair of diabetic shoes and 3 pairs of foam casted diabetic insoles. Fit was satisfactory. Instructions for break-in and wear was reviewed and a copy was given to the patient.   Re-appointment for regularly scheduled diabetic foot care visits or if they should experience any trouble with the shoes or insoles.  

## 2021-01-09 ENCOUNTER — Telehealth (INDEPENDENT_AMBULATORY_CARE_PROVIDER_SITE_OTHER): Payer: Medicare Other | Admitting: Family Medicine

## 2021-01-09 ENCOUNTER — Other Ambulatory Visit: Payer: Self-pay | Admitting: Family Medicine

## 2021-01-09 ENCOUNTER — Encounter: Payer: Self-pay | Admitting: Family Medicine

## 2021-01-09 ENCOUNTER — Other Ambulatory Visit: Payer: Self-pay

## 2021-01-09 DIAGNOSIS — R0981 Nasal congestion: Secondary | ICD-10-CM | POA: Diagnosis not present

## 2021-01-09 DIAGNOSIS — R519 Headache, unspecified: Secondary | ICD-10-CM

## 2021-01-09 DIAGNOSIS — H9201 Otalgia, right ear: Secondary | ICD-10-CM | POA: Diagnosis not present

## 2021-01-09 DIAGNOSIS — L853 Xerosis cutis: Secondary | ICD-10-CM

## 2021-01-09 MED ORDER — AMOXICILLIN-POT CLAVULANATE 875-125 MG PO TABS: 1.0000 | ORAL_TABLET | Freq: Two times a day (BID) | ORAL | 0 refills | Status: DC

## 2021-01-09 NOTE — Progress Notes (Signed)
Virtual Visit via Telephone Note  I connected with Angela Pugh on 01/09/21 at 10:00 AM EDT by telephone and verified that I am speaking with the correct person using two identifiers.   I discussed the limitations, risks, security and privacy concerns of performing an evaluation and management service by telephone and the availability of in person appointments. I also discussed with the patient that there may be a patient responsible charge related to this service. The patient expressed understanding and agreed to proceed.  Location patient: home, [place] Location provider: work or home office Participants present for the call: patient, provider Patient did not have a visit with me in the prior 7 days to address this/these issue(s).   History of Present Illness:  Acute telemedicine visit for sinus issues: -Onset: has had chronic issues with her allergies all summer but has been worse the last few months, reports took amoxicillin for an ear infection a few months ago and did better, but now bad again the last few weeks -Symptoms include: nasal congestion, sinus discomfort, R ear full and discomfort -Denies:fevers, NVD, CP, SOB, worst headache, inability to eat/drink/get out of bed -Has tried:vics sinex, fexofenadine -she has not been not been using flonase -Pertinent past medical history: sinus infection, ear infection - reports saw an ENT specialist in the past -Pertinent medication allergies: Allergies  Allergen Reactions   Acetaminophen Hives and Nausea Only   Atorvastatin Nausea Only    weakness   Azithromycin Hives and Nausea And Vomiting   Zyrtec [Cetirizine]     Hives   -COVID-19 vaccine status: vaccinated and boosted -no known sick contacts recently   Observations/Objective: Patient sounds cheerful and well on the phone. I do not appreciate any SOB. Speech and thought processing are grossly intact. Patient reported vitals:  Assessment and Plan:  Sinus  congestion  Facial discomfort  Discomfort of right ear  -we discussed possible serious and likely etiologies, options for evaluation and workup, limitations of telemedicine visit vs in person visit, treatment, treatment risks and precautions. Pt prefers to treat via telemedicine empirically rather than in person at this moment.  Query developing sinusitis, possible eustachian tube dysfunction versus otitis media versus other, likely secondary to chronic allergic rhinitis or other sinusitis.  Also discussed possible acute illnesses, viruses etc.  Given duration of symptoms, however have opted to treat with Augmentin 875 twice daily for 10 days.  Also advised starting nasal saline rinse and adding Nasonex to her regimen.  Advised if any worsening, not resolved with treatment or if she has recurrent issues, that she see an ear nose and throat specialist.  Discussed options.  She is in agreement.  She agrees to contact the ear nose and throat office. Advised to seek prompt in person care if worsening, new symptoms arise, or if is not improving with treatment. Advised of options for inperson care in case PCP office not available. Did let the patient know that I only do telemedicine shifts for [place] on Tuesdays and Thursdays and advised a follow up visit with PCP or at an [facility] if has further questions or concerns.   Follow Up Instructions:  I did not refer this patient for an OV with me in the next 24 hours for this/these issue(s).  I discussed the assessment and treatment plan with the patient. The patient was provided an opportunity to ask questions and all were answered. The patient agreed with the plan and demonstrated an understanding of the instructions.   I spent 13 minutes  on the date of this visit in the care of this patient. See summary of tasks completed to properly care for this patient in the detailed notes above which also included counseling of above, review of PMH, medications, allergies,  evaluation of the patient and ordering and/or  instructing patient on testing and care options.     Lucretia Kern, DO

## 2021-01-09 NOTE — Patient Instructions (Addendum)
-  I sent the medication(s) we discussed to your pharmacy: Meds ordered this encounter  Medications   amoxicillin-clavulanate (AUGMENTIN) 875-125 MG tablet    Sig: Take 1 tablet by mouth 2 (two) times daily.    Dispense:  20 tablet    Refill:  0    -nasal saline rinse twice daily  -also start nasonex  -continue allergy pill  -please see the Ear, Nose and Throat doctor if worsening, persistent or recurring symptoms.  I hope you are feeling better soon!  Seek in person care promptly if your symptoms worsen, new concerns arise or you are not improving with treatment.  It was nice to meet you today. I help  out with telemedicine visits on Tuesdays and Thursdays and am available for visits on those days. If you have any concerns or questions following this visit please schedule a follow up visit with your Primary Care doctor or seek care at a local urgent care clinic to avoid delays in care.

## 2021-01-14 DIAGNOSIS — H469 Unspecified optic neuritis: Secondary | ICD-10-CM | POA: Diagnosis not present

## 2021-01-14 DIAGNOSIS — E119 Type 2 diabetes mellitus without complications: Secondary | ICD-10-CM | POA: Diagnosis not present

## 2021-01-14 DIAGNOSIS — H25013 Cortical age-related cataract, bilateral: Secondary | ICD-10-CM | POA: Diagnosis not present

## 2021-01-14 DIAGNOSIS — H40013 Open angle with borderline findings, low risk, bilateral: Secondary | ICD-10-CM | POA: Diagnosis not present

## 2021-01-14 DIAGNOSIS — H2513 Age-related nuclear cataract, bilateral: Secondary | ICD-10-CM | POA: Diagnosis not present

## 2021-01-15 ENCOUNTER — Other Ambulatory Visit: Payer: Self-pay

## 2021-01-15 ENCOUNTER — Encounter: Payer: Self-pay | Admitting: Cardiovascular Disease

## 2021-01-15 ENCOUNTER — Ambulatory Visit: Payer: Medicare Other | Admitting: Cardiovascular Disease

## 2021-01-15 VITALS — BP 183/82 | HR 55 | Ht 69.0 in | Wt 164.4 lb

## 2021-01-15 DIAGNOSIS — E782 Mixed hyperlipidemia: Secondary | ICD-10-CM | POA: Diagnosis not present

## 2021-01-15 DIAGNOSIS — I428 Other cardiomyopathies: Secondary | ICD-10-CM | POA: Diagnosis not present

## 2021-01-15 DIAGNOSIS — I10 Essential (primary) hypertension: Secondary | ICD-10-CM | POA: Diagnosis not present

## 2021-01-15 DIAGNOSIS — I208 Other forms of angina pectoris: Secondary | ICD-10-CM

## 2021-01-15 DIAGNOSIS — I2089 Other forms of angina pectoris: Secondary | ICD-10-CM

## 2021-01-15 DIAGNOSIS — I447 Left bundle-branch block, unspecified: Secondary | ICD-10-CM

## 2021-01-15 LAB — HEPATIC FUNCTION PANEL
ALT: 11 IU/L (ref 0–32)
AST: 14 IU/L (ref 0–40)
Albumin: 4.6 g/dL (ref 3.7–4.7)
Alkaline Phosphatase: 73 IU/L (ref 44–121)
Bilirubin Total: 0.3 mg/dL (ref 0.0–1.2)
Bilirubin, Direct: 0.11 mg/dL (ref 0.00–0.40)
Total Protein: 7.6 g/dL (ref 6.0–8.5)

## 2021-01-15 LAB — LIPID PANEL
Chol/HDL Ratio: 5 ratio — ABNORMAL HIGH (ref 0.0–4.4)
Cholesterol, Total: 253 mg/dL — ABNORMAL HIGH (ref 100–199)
HDL: 51 mg/dL (ref >39)
LDL Chol Calc (NIH): 178 mg/dL — ABNORMAL HIGH (ref 0–99)
Triglycerides: 133 mg/dL (ref 0–149)
VLDL Cholesterol Cal: 24 mg/dL (ref 5–40)

## 2021-01-15 NOTE — Assessment & Plan Note (Signed)
History of atypical chest pain with normal coronary arteries by diagnostic cath which I performed 03/17/2018 after an abnormal CT FFR.

## 2021-01-15 NOTE — Assessment & Plan Note (Signed)
History of nonischemic cardiomyopathy with an EF in the 40% range currently on Benicar.  She cannot be on a beta-blocker because of bradycardia.  Most recent 2D echo performed 11/11/2020 revealed normalization of LV function in the 60 to 65% range with mild MR.

## 2021-01-15 NOTE — Assessment & Plan Note (Signed)
History of essential hypertension a blood pressure measured today at 183/82.  She is on amlodipine and Benicar.  She usually takes her blood pressure at home and it runs in the 123456 systolic range.  She takes her antihypertensive medicines in the evening.  She is currently suffering from a sinus infection on antibiotics.

## 2021-01-15 NOTE — Assessment & Plan Note (Signed)
History of hyperlipidemia not on statin therapy with lipid profile performed 07/13/2019 revealing LDL 158.  We will recheck a lipid liver profile today.

## 2021-01-15 NOTE — Patient Instructions (Signed)
Medication Instructions:  The current medical regimen is effective;  continue present plan and medications.  *If you need a refill on your cardiac medications before your next appointment, please call your pharmacy*   Lab Work: LIPID/LIVER today  If you have labs (blood work) drawn today and your tests are completely normal, you will receive your results only by: . MyChart Message (if you have MyChart) OR . A paper copy in the mail If you have any lab test that is abnormal or we need to change your treatment, we will call you to review the results.   Follow-Up: At [facility], you and your health needs are our priority.  As part of our continuing mission to provide you with exceptional heart care, we have created designated Provider Care Teams.  These Care Teams include your primary Cardiologist (physician) and Advanced Practice Providers (APPs -  Physician Assistants and Nurse Practitioners) who all work together to provide you with the care you need, when you need it.  We recommend signing up for the patient portal called "MyChart".  Sign up information is provided on this After Visit Summary.  MyChart is used to connect with patients for Virtual Visits (Telemedicine).  Patients are able to view lab/test results, encounter notes, upcoming appointments, etc.  Non-urgent messages can be sent to your provider as well.   To learn more about what you can do with MyChart, go to [email or web address].    Your next appointment:   12 month(s)  The format for your next appointment:   In Person  Provider:   Jonathan Berry, MD      

## 2021-01-15 NOTE — Progress Notes (Signed)
01/15/2021 [name]   [birth year]  [id number]  Primary Physician [name], MD Primary Cardiologist: Lorretta Harp MD [name], [place]  HPI:  Angela Pugh is a 75 y.o.  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  She was referred by Dr. Volanda Napoleon for cardiovascular evaluation because of newly recognized left bundle branch block and chest pain.  I last saw her in the office 10/10/2020.  She is accompanied by her daughter Angela Pugh today.  She does have a history of hypertension and diet-controlled diabetes.  She is never had a heart attack or stroke.  She has had recent carotid Dopplers performed in May revealing moderate bilateral ICA stenosis.  She had a newly recognized left bundle branch block as well.  She is had chest pain for several years which has increased in frequency and severity now occurring on a daily basis.  Is no relation to food or activity.   A CT FFR performed 03/04/2018 showed a physiologically significant lesion in the proximal LAD with an FFR of 0.78.  Based on this, we decided to proceed with outpatient diagnostic radial cardiac catheterization.   This was performed 03/17/2018 revealing normal coronary arteries and moderate LV dysfunction with an EF of 40%.   I did refer her to Dr. Haroldine Laws who did a cardiac MRI that showed no evidence of myocarditis and an event monitor that was normal as well.  She has a nonischemic cardiomyopathy with an EF in the 35 to 40% range.  She was on low-dose Benicar amlodipine.  She gets episodic episodes of breath and atypical chest pain that improves with Gas-X.   Since I saw her 3 months ago she continues to do well.  A recent echo performed 11/11/2020 showed normalization of LV function.  She occasionally gets atypical chest pain and palpitations.  Unfortunately, we did put an event monitor on her which she can only wear for 2  days because of inability to keep it on as of excessive sweating.  It did show occasional PACs and PVCs and short runs of SVT.   Current Meds  Medication Sig   amLODipine (NORVASC) 5 MG tablet TAKE 1 TABLET(5 MG) BY MOUTH DAILY   amoxicillin-clavulanate (AUGMENTIN) 875-125 MG tablet Take 1 tablet by mouth 2 (two) times daily.   Aromatic Inhalants (VICKS VAPOR INHALER IN) Inhale 1 puff into the lungs as needed.   aspirin 81 MG EC tablet Take 81 mg by mouth daily. Swallow whole.   Calcium Carb-Cholecalciferol (CALCIUM 1000 + D PO)    famotidine (PEPCID) 20 MG tablet Take 1 tablet (20 mg total) by mouth 3 times/day as needed-between meals & bedtime for heartburn or indigestion.   fexofenadine (ALLEGRA ALLERGY) 180 MG tablet Take 1 tablet (180 mg total) by mouth daily.   hyoscyamine (LEVSIN SL) 0.125 MG SL tablet Take 1 to 2 tablets under the tongue as needed for cramping and abdominal bloating.   mesalamine (LIALDA) 1.2 g EC tablet Take 4 tablets (4.8 g total) by mouth daily with breakfast.   Multiple Vitamins-Minerals (CENTRUM ADULTS PO) Take 1 tablet by mouth daily.    pantoprazole (PROTONIX) 40 MG tablet Take 1 tablet (40 mg total) by mouth daily.   potassium chloride SA (KLOR-CON) 20 MEQ tablet Take 20 mEq by mouth daily.   triamcinolone cream (KENALOG) 0.5 % APPLY EXTERNALLY TO THE AFFECTED AREA TWICE DAILY AS NEEDED  FOR DRY SKIN     Allergies  Allergen Reactions   Acetaminophen Hives and Nausea Only   Atorvastatin Nausea Only    weakness   Azithromycin Hives and Nausea And Vomiting   Zyrtec [Cetirizine]     Hives     Social History   Socioeconomic History   Marital status: Widowed    Spouse name: Not on file   Number of children: Not on file   Years of education: Not on file   Highest education level: Not on file  Occupational History   Not on file  Tobacco Use   Smoking status: Former    Packs/day: 0.50    Years: 20.00    Pack years: 10.00    Types: Cigarettes     Quit date: 06/30/1982    Years since quitting: 38.5   Smokeless tobacco: Never  Vaping Use   Vaping Use: Never used  Substance and Sexual Activity   Alcohol use: Not Currently    Comment: 05/04/2016 "might have a drink a couple days/year; might not"   Drug use: No   Sexual activity: Not Currently  Other Topics Concern   Not on file  Social History Narrative   Not on file   Social Determinants of Health   Financial Resource Strain: Low Risk    Difficulty of Paying Living Expenses: Not hard at all  Food Insecurity: No Food Insecurity   Worried About Charity fundraiser in the Last Year: Never true   [place] in the Last Year: Never true  Transportation Needs: No Transportation Needs   Lack of Transportation (Medical): No   Lack of Transportation (Non-Medical): No  Physical Activity: Inactive   Days of Exercise per Week: 0 days   Minutes of Exercise per Session: 0 min  Stress: No Stress Concern Present   Feeling of Stress : Not at all  Social Connections: Moderately Isolated   Frequency of Communication with Friends and Family: More than three times a week   Frequency of Social Gatherings with Friends and Family: Once a week   Attends Religious Services: More than 4 times per year   Active Member of [employer] or Organizations: No   Attends Archivist Meetings: Never   Marital Status: Widowed  Human resources officer Violence: Not At Risk   Fear of Current or Ex-Partner: No   Emotionally Abused: No   Physically Abused: No   Sexually Abused: No     Review of Systems: General: negative for chills, fever, night sweats or weight changes.  Cardiovascular: negative for chest pain, dyspnea on exertion, edema, orthopnea, palpitations, paroxysmal nocturnal dyspnea or shortness of breath Dermatological: negative for rash Respiratory: negative for cough or wheezing Urologic: negative for hematuria Abdominal: negative for nausea, vomiting, diarrhea, bright red blood per rectum,  melena, or hematemesis Neurologic: negative for visual changes, syncope, or dizziness All other systems reviewed and are otherwise negative except as noted above.    Blood pressure (!) 183/82, pulse (!) 55, height '5\' 9"'$  (1.753 m), weight 164 lb 6.4 oz (74.6 kg), SpO2 98 %.  General appearance: alert and no distress Neck: no adenopathy, no carotid bruit, no JVD, supple, symmetrical, trachea midline, and thyroid not enlarged, symmetric, no tenderness/mass/nodules Lungs: clear to auscultation bilaterally Heart: regular rate and rhythm, S1, S2 normal, no murmur, click, rub or gallop Extremities: extremities normal, atraumatic, no cyanosis or edema Pulses: 2+ and symmetric Skin: Skin color, texture, turgor normal. No rashes or lesions Neurologic: Grossly normal  EKG not performed today  ASSESSMENT AND PLAN:   Hypertension History of essential hypertension a blood pressure measured today at 183/82.  She is on amlodipine and Benicar.  She usually takes her blood pressure at home and it runs in the 123456 systolic range.  She takes her antihypertensive medicines in the evening.  She is currently suffering from a sinus infection on antibiotics.  HLD (hyperlipidemia) History of hyperlipidemia not on statin therapy with lipid profile performed 07/13/2019 revealing LDL 158.  We will recheck a lipid liver profile today.  Chest pain History of atypical chest pain with normal coronary arteries by diagnostic cath which I performed 03/17/2018 after an abnormal CT FFR.  Left bundle branch block Chronic  Nonischemic cardiomyopathy ([place]) History of nonischemic cardiomyopathy with an EF in the 40% range currently on Benicar.  She cannot be on a beta-blocker because of bradycardia.  Most recent 2D echo performed 11/11/2020 revealed normalization of LV function in the 60 to 65% range with mild MR.     Lorretta Harp MD FACP,FACC,FAHA, [facility] 01/15/2021 10:30 AM

## 2021-01-15 NOTE — Assessment & Plan Note (Signed)
Chronic. 

## 2021-01-16 ENCOUNTER — Telehealth: Payer: Self-pay | Admitting: Family Medicine

## 2021-01-16 NOTE — Telephone Encounter (Signed)
Left message for patient to call back and schedule Medicare Annual Wellness Visit (AWV) either virtually or in office. Left  my Angela Pugh number [phone]   Last AWVS 02/21/20  please schedule at anytime with [facility] Nurse Health Advisor 1 or 2   This should be a 45 minute visit.   [employer] medicare can do AWV calendar year

## 2021-01-28 ENCOUNTER — Other Ambulatory Visit: Payer: Self-pay

## 2021-01-28 ENCOUNTER — Ambulatory Visit (INDEPENDENT_AMBULATORY_CARE_PROVIDER_SITE_OTHER): Payer: Medicare Other

## 2021-01-28 VITALS — BP 128/68 | HR 55 | Temp 98.6°F | Wt 165.0 lb

## 2021-01-28 DIAGNOSIS — Z0000 Encounter for general adult medical examination without abnormal findings: Secondary | ICD-10-CM | POA: Diagnosis not present

## 2021-01-28 NOTE — Progress Notes (Addendum)
Subjective:   Angela Pugh is a 75 y.o. female who presents for Medicare Annual (Subsequent) preventive examination.  Review of Systems     Cardiac Risk Factors include: advanced age (>5mn, >>32women);hypertension;diabetes mellitus;dyslipidemia     Objective:    Today's Vitals   01/28/21 1107  BP: 128/68  Pulse: (!) 55  Temp: 98.6 F (37 C)  SpO2: 96%  Weight: 165 lb (74.8 kg)   Body mass index is 24.37 kg/m.  Advanced Directives 01/28/2021 02/21/2020 10/17/2018 04/13/2018 04/12/2018 03/17/2018 05/05/2016  Does Patient Have a Medical Advance Directive? Yes Yes Yes Yes Yes Yes Yes  Type of AParamedicof [place] will Living will [place]Living will [place] BeachLiving will HEdroyLiving will HDelmitaLiving will  Does patient want to make changes to medical advance directive? - No - Patient declined - No - Patient declined - No - Patient declined No - Patient declined  Copy of HM[place]in Chart? No - copy requested No - copy requested - No - copy requested No - copy requested Yes -  Would patient like information on creating a medical advance directive? - - - - - - -  Pre-existing out of facility DNR order (yellow form or pink MOST form) - - - - - - -    Current Medications (verified) Outpatient Encounter Medications as of 01/28/2021  Medication Sig   amLODipine (NORVASC) 5 MG tablet TAKE 1 TABLET(5 MG) BY MOUTH DAILY   Aromatic Inhalants (VICKS VAPOR INHALER IN) Inhale 1 puff into the lungs as needed.   aspirin 81 MG EC tablet Take 81 mg by mouth daily. Swallow whole.   Calcium Carb-Cholecalciferol (CALCIUM 1000 + D PO)    famotidine (PEPCID) 20 MG tablet Take 1 tablet (20 mg total) by mouth 3 times/day as needed-between meals & bedtime for heartburn or indigestion.   fexofenadine (ALLEGRA ALLERGY) 180 MG tablet  Take 1 tablet (180 mg total) by mouth daily.   hyoscyamine (LEVSIN SL) 0.125 MG SL tablet Take 1 to 2 tablets under the tongue as needed for cramping and abdominal bloating.   mesalamine (LIALDA) 1.2 g EC tablet Take 4 tablets (4.8 g total) by mouth daily with breakfast.   Multiple Vitamins-Minerals (CENTRUM ADULTS PO) Take 1 tablet by mouth daily.    olmesartan (BENICAR) 5 MG tablet Take 10 mg by mouth daily.   pantoprazole (PROTONIX) 40 MG tablet Take 1 tablet (40 mg total) by mouth daily.   potassium chloride SA (KLOR-CON) 20 MEQ tablet Take 20 mEq by mouth daily.   triamcinolone cream (KENALOG) 0.5 % APPLY EXTERNALLY TO THE AFFECTED AREA TWICE DAILY AS NEEDED FOR DRY SKIN   amoxicillin-clavulanate (AUGMENTIN) 875-125 MG tablet Take 1 tablet by mouth 2 (two) times daily. (Patient not taking: Reported on 01/28/2021)   No facility-administered encounter medications on file as of 01/28/2021.    Allergies (verified) Acetaminophen, Atorvastatin, Azithromycin, and Zyrtec [cetirizine]   History: Past Medical History:  Diagnosis Date   Arthritis    back, knees, shoulder, hands , injections in pelvis, for bone spurs (05/04/2016)   Chronic back pain    Diverticulosis    History of kidney stones    HLD (hyperlipidemia)    Hypertension    Migraine    hx (05/04/2016)   Peripheral neuropathy    Radiculopathy    Sinus complaint    Sinus headache  occasional (05/04/2016)   Type II diabetes mellitus (HCC)    diet controlled, no meds now, was on insulin & po treatment at one time     Vertigo    Past Surgical History:  Procedure Laterality Date   ANTERIOR CERVICAL DECOMP/DISCECTOMY FUSION N/A 04/17/2016   Procedure: ANTERIOR CERVICAL DECOMPRESSION/DISCECTOMY FUSION CERVICAL THREE - CERVICAL FOUR, POSTERIOR CERVICAL DISCECTOMY CERVICAL SEVEN -THORASIC ONE LEFT;  Surgeon: Ashok Pall, MD;  Location: [facility];  Service: Neurosurgery;  Laterality: N/A;  Anterior/Posterior   ANTERIOR FUSION CERVICAL  SPINE  2000   Dr. Carloyn Manner; "for bone spurs; put hardware in too"   BACK SURGERY     COLONOSCOPY  06/10/2016   [facility]   LEFT HEART CATH AND CORONARY ANGIOGRAPHY N/A 03/17/2018   Procedure: LEFT HEART CATH AND CORONARY ANGIOGRAPHY;  Surgeon: Lorretta Harp, MD;  Location: [facility];  Service: Cardiovascular;  Laterality: N/A;   LUMBAR LAMINECTOMY Left 06/30/2013   Procedure: Left L3-4 Extraforaminal approach to excise far lateral herniated nucleus pulposus;  Surgeon: Jessy Oto, MD;  Location: [place];  Service: Orthopedics;  Laterality: Left;   TUBAL LIGATION     VAGINAL HYSTERECTOMY     Family History  Problem Relation Age of Onset   Uterine cancer Mother    Diabetes type II Sister    Diabetes Mellitus II Brother    Colon cancer Neg Hx    Pancreatic cancer Neg Hx    Esophageal cancer Neg Hx    Colon polyps Neg Hx    Rectal cancer Neg Hx    Stomach cancer Neg Hx    Social History   Socioeconomic History   Marital status: Widowed    Spouse name: Not on file   Number of children: Not on file   Years of education: Not on file   Highest education level: Not on file  Occupational History   Not on file  Tobacco Use   Smoking status: Former    Packs/day: 0.50    Years: 20.00    Pack years: 10.00    Types: Cigarettes    Quit date: 06/30/1982    Years since quitting: 38.6   Smokeless tobacco: Never  Vaping Use   Vaping Use: Never used  Substance and Sexual Activity   Alcohol use: Not Currently    Comment: 05/04/2016 "might have a drink a couple days/year; might not"   Drug use: No   Sexual activity: Not Currently  Other Topics Concern   Not on file  Social History Narrative   Not on file   Social Determinants of Health   Financial Resource Strain: Low Risk    Difficulty of Paying Living Expenses: Not hard at all  Food Insecurity: No Food Insecurity   Worried About Charity fundraiser in the Last Year: Never true   [place] in the Last Year: Never true   Transportation Needs: No Transportation Needs   Lack of Transportation (Medical): No   Lack of Transportation (Non-Medical): No  Physical Activity: Insufficiently Active   Days of Exercise per Week: 5 days   Minutes of Exercise per Session: 20 min  Stress: No Stress Concern Present   Feeling of Stress : Not at all  Social Connections: Moderately Isolated   Frequency of Communication with Friends and Family: More than three times a week   Frequency of Social Gatherings with Friends and Family: More than three times a week   Attends Religious Services: More than 4  times per year   Active Member of Clubs or Organizations: No   Attends Archivist Meetings: Never   Marital Status: Widowed    Tobacco Counseling Counseling given: Not Answered   Clinical Intake:  Pre-visit preparation completed: Yes  Pain : No/denies pain     BMI - recorded: 24.37 Nutritional Status: BMI of 19-24  Normal Nutritional Risks: None Diabetes: Yes CBG done?: No Did pt. bring in CBG monitor from home?: No  How often do you need to have someone help you when you read instructions, pamphlets, or other written materials from your doctor or pharmacy?: 1 - Never  Diabetic?Nutrition Risk Assessment:  Has the patient had any N/V/D within the last 2 months?  No  Does the patient have any non-healing wounds?  No  Has the patient had any unintentional weight loss or weight gain?  No   Diabetes:  Is the patient diabetic?  Yes  If diabetic, was a CBG obtained today?  No  Did the patient bring in their glucometer from home?  No  How often do you monitor your CBG's? One a week .   Financial Strains and Diabetes Management:  Are you having any financial strains with the device, your supplies or your medication? No .  Does the patient want to be seen by Chronic Care Management for management of their diabetes?  No  Would the patient like to be referred to a Nutritionist or for Diabetic Management?   No   Diabetic Exams:  Diabetic Eye Exam: Completed 08/13/20 Diabetic Foot Exam: Completed 02/28/20   Interpreter Needed?: No  Information entered by :: Charlott Rakes, LPN   Activities of Daily Living In your present state of health, do you have any difficulty performing the following activities: 01/28/2021 02/21/2020  Hearing? N N  Vision? N N  Difficulty concentrating or making decisions? N Y  Comment - Sometimes has issues with short term memory.  Walking or climbing stairs? N N  Dressing or bathing? N N  Doing errands, shopping? N N  Preparing Food and eating ? N N  Using the Toilet? N N  In the past six months, have you accidently leaked urine? N N  Do you have problems with loss of bowel control? N N  Managing your Medications? N N  Managing your Finances? N N  Housekeeping or managing your Housekeeping? N N  Some recent data might be hidden    Patient Care Team: [name], MD as PCP - General (Family Medicine) Bensimhon, Shaune Pascal, MD as PCP - Advanced Heart Failure (Cardiology)  Indicate any recent Medical Services you may have received from other than [facility] providers in the past year (date may be approximate).     Assessment:   This is a routine wellness examination for Angela Pugh.  Hearing/Vision screen Hearing Screening - Comments:: Pt denies any hearing issues  Vision Screening - Comments:: Pt follows up with [facility] for annual ey exams   Dietary issues and exercise activities discussed: Current Exercise Habits: Home exercise routine, Type of exercise: strength training/weights;Other - see comments (working in yard), Time (Minutes): 20, Frequency (Times/Week): 5, Weekly Exercise (Minutes/Week): 100   Goals Addressed             This Visit's Progress    Patient Stated       Get muscles stronger        Depression Screen PHQ 2/9 Scores 01/28/2021 02/21/2020 02/16/2019 10/18/2017  PHQ - 2 Score 0 0  0 0  PHQ- 9 Score - 0 - -    Fall Risk Fall  Risk  01/28/2021 02/21/2020 10/18/2017  Falls in the past year? 0 0 No  Number falls in past yr: 0 0 -  Injury with Fall? 0 0 -  Risk for fall due to : Impaired vision;Impaired balance/gait No Fall Risks -  Risk for fall due to: Comment with sinus issues - -  Follow up Falls prevention discussed Falls evaluation completed;Falls prevention discussed -    FALL RISK PREVENTION PERTAINING TO THE HOME:  Any stairs in or around the home? No  If so, are there any without handrails? No  Home free of loose throw rugs in walkways, pet beds, electrical cords, etc? Yes  Adequate lighting in your home to reduce risk of falls? Yes   ASSISTIVE DEVICES UTILIZED TO PREVENT FALLS:  Life alert? No  Use of a cane, walker or w/c? No  Grab bars in the bathroom? No  Shower chair or bench in shower? Yes  Elevated toilet seat or a handicapped toilet? No   TIMED UP AND GO:  Was the test performed? Yes .  Length of time to ambulate 10 feet: 10 sec.   Gait steady and fast without use of assistive device  Cognitive Function:     6CIT Screen 01/28/2021 02/21/2020  What Year? 0 points 0 points  What month? 0 points 0 points  What time? 0 points 0 points  Count back from 20 0 points 0 points  Months in reverse 0 points 0 points  Repeat phrase 2 points 0 points  Total Score 2 0    Immunizations Immunization History  Administered Date(s) Administered   PFIZER(Purple Top)SARS-COV-2 Vaccination 06/24/2019, 07/15/2019, 02/27/2020    TDAP status: Due, Education has been provided regarding the importance of this vaccine. Advised may receive this vaccine at local pharmacy or Health Dept. Aware to provide a copy of the vaccination record if obtained from local pharmacy or Health Dept. Verbalized acceptance and understanding.  Flu Vaccine status: Due, Education has been provided regarding the importance of this vaccine. Advised may receive this vaccine at local pharmacy or Health Dept. Aware to provide a copy of  the vaccination record if obtained from local pharmacy or Health Dept. Verbalized acceptance and understanding.  Pneumococcal vaccine status: Due, Education has been provided regarding the importance of this vaccine. Advised may receive this vaccine at local pharmacy or Health Dept. Aware to provide a copy of the vaccination record if obtained from local pharmacy or Health Dept. Verbalized acceptance and understanding.  Covid-19 vaccine status: Completed vaccines  Qualifies for Shingles Vaccine? Yes   Zostavax completed No   Shingrix Completed?: No.    Education has been provided regarding the importance of this vaccine. Patient has been advised to call insurance company to determine out of pocket expense if they have not yet received this vaccine. Advised may also receive vaccine at local pharmacy or Health Dept. Verbalized acceptance and understanding.  Screening Tests Health Maintenance  Topic Date Due   Hepatitis C Screening  Never done   TETANUS/TDAP  Never done   COVID-19 Vaccine (4 - Booster for Pfizer series) 06/29/2020   Zoster Vaccines- Shingrix (1 of 2) 04/29/2021 (Originally 08/27/1996)   INFLUENZA VACCINE  08/15/2021 (Originally 12/16/2020)   PNA vac Low Risk Adult (1 of 2 - PCV13) 01/28/2022 (Originally 08/28/2011)   FOOT EXAM  02/27/2021   HEMOGLOBIN A1C  04/05/2021   OPHTHALMOLOGY EXAM  08/13/2021  MAMMOGRAM  04/09/2022   DEXA SCAN  Completed   HPV VACCINES  Aged Out    Health Maintenance  Health Maintenance Due  Topic Date Due   Hepatitis C Screening  Never done   TETANUS/TDAP  Never done   COVID-19 Vaccine (4 - Booster for Pfizer series) 06/29/2020    Colorectal cancer screening: No longer required.   Mammogram status: Completed 04/09/20. Repeat every year  Bone Density status: Completed 08/17/19. Results reflect: Bone density results: NORMAL. Repeat every 2 years.    Additional Screening:  Hepatitis C Screening: does qualify;  Vision Screening:  Recommended annual ophthalmology exams for early detection of glaucoma and other disorders of the eye. Is the patient up to date with their annual eye exam?  Yes  Who is the provider or what is the name of the office in which the patient attends annual eye exams? [facility]  If pt is not established with a provider, would they like to be referred to a provider to establish care? No .   Dental Screening: Recommended annual dental exams for proper oral hygiene  Community Resource Referral / Chronic Care Management: CRR required this visit?  No   CCM required this visit?  No      Plan:     I have personally reviewed and noted the following in the patient's chart:   Medical and social history Use of alcohol, tobacco or illicit drugs  Current medications and supplements including opioid prescriptions.  Functional ability and status Nutritional status Physical activity Advanced directives List of other physicians Hospitalizations, surgeries, and ER visits in previous 12 months Vitals Screenings to include cognitive, depression, and falls Referrals and appointments  In addition, I have reviewed and discussed with patient certain preventive protocols, quality metrics, and best practice recommendations. A written personalized care plan for preventive services as well as general preventive health recommendations were provided to patient.     Willette Brace, LPN   [id number]  Nurse Notes: None

## 2021-01-28 NOTE — Patient Instructions (Addendum)
Angela Pugh , Thank you for taking time to come for your Medicare Wellness Visit. I appreciate your ongoing commitment to your health goals. Please review the following plan we discussed and let me know if I can assist you in the future.   Screening recommendations/referrals: Colonoscopy: Done 04/26/20 not a candidate  Mammogram: Done 04/09/20 repeat every year Bone Density: Done 08/17/19 repeat every 2 years  Recommended yearly ophthalmology/optometry visit for glaucoma screening and checkup Recommended yearly dental visit for hygiene and checkup  Vaccinations: Influenza vaccine: Due and declined  Pneumococcal vaccine: Declined and discussed Tdap vaccine: Declined  Shingles vaccine: Shingrix discussed. Please contact your pharmacy for coverage information.    Covid-19:Completed 2/6, 2/27, & 02/27/20  Advanced directives: Please bring a copy of your health care power of attorney and living will to the office at your convenience.  Conditions/risks identified: Get muscles stronger   Next appointment: Follow up in one year for your annual wellness visit    Preventive Care 65 Years and Older, Female Preventive care refers to lifestyle choices and visits with your health care provider that can promote health and wellness. What does preventive care include? A yearly physical exam. This is also called an annual well check. Dental exams once or twice a year. Routine eye exams. Ask your health care provider how often you should have your eyes checked. Personal lifestyle choices, including: Daily care of your teeth and gums. Regular physical activity. Eating a healthy diet. Avoiding tobacco and drug use. Limiting alcohol use. Practicing safe sex. Taking low-dose aspirin every day. Taking vitamin and mineral supplements as recommended by your health care provider. What happens during an annual well check? The services and screenings done by your health care provider during your annual well  check will depend on your age, overall health, lifestyle risk factors, and family history of disease. Counseling  Your health care provider may ask you questions about your: Alcohol use. Tobacco use. Drug use. Emotional well-being. Home and relationship well-being. Sexual activity. Eating habits. History of falls. Memory and ability to understand (cognition). Work and work Statistician. Reproductive health. Screening  You may have the following tests or measurements: Height, weight, and BMI. Blood pressure. Lipid and cholesterol levels. These may be checked every 5 years, or more frequently if you are over 8 years old. Skin check. Lung cancer screening. You may have this screening every year starting at age 74 if you have a 30-pack-year history of smoking and currently smoke or have quit within the past 15 years. Fecal occult blood test (FOBT) of the stool. You may have this test every year starting at age 74. Flexible sigmoidoscopy or colonoscopy. You may have a sigmoidoscopy every 5 years or a colonoscopy every 10 years starting at age 74. Hepatitis C blood test. Hepatitis B blood test. Sexually transmitted disease (STD) testing. Diabetes screening. This is done by checking your blood sugar (glucose) after you have not eaten for a while (fasting). You may have this done every 1-3 years. Bone density scan. This is done to screen for osteoporosis. You may have this done starting at age 74. Mammogram. This may be done every 1-2 years. Talk to your health care provider about how often you should have regular mammograms. Talk with your health care provider about your test results, treatment options, and if necessary, the need for more tests. Vaccines  Your health care provider may recommend certain vaccines, such as: Influenza vaccine. This is recommended every year. Tetanus, diphtheria, and acellular pertussis (  Tdap, Td) vaccine. You may need a Td booster every 10 years. Zoster  vaccine. You may need this after age 74. Pneumococcal 13-valent conjugate (PCV13) vaccine. One dose is recommended after age 74. Pneumococcal polysaccharide (PPSV23) vaccine. One dose is recommended after age 74. Talk to your health care provider about which screenings and vaccines you need and how often you need them. This information is not intended to replace advice given to you by your health care provider. Make sure you discuss any questions you have with your health care provider. Document Released: 05/31/2015 Document Revised: 01/22/2016 Document Reviewed: 03/05/2015 Elsevier Interactive Patient Education  2017 [employer] Prevention in the Home Falls can cause injuries. They can happen to people of all ages. There are many things you can do to make your home safe and to help prevent falls. What can I do on the outside of my home? Regularly fix the edges of walkways and driveways and fix any cracks. Remove anything that might make you trip as you walk through a door, such as a raised step or threshold. Trim any bushes or trees on the path to your home. Use bright outdoor lighting. Clear any walking paths of anything that might make someone trip, such as rocks or tools. Regularly check to see if handrails are loose or broken. Make sure that both sides of any steps have handrails. Any raised decks and porches should have guardrails on the edges. Have any leaves, snow, or ice cleared regularly. Use sand or salt on walking paths during winter. Clean up any spills in your garage right away. This includes oil or grease spills. What can I do in the bathroom? Use night lights. Install grab bars by the toilet and in the tub and shower. Do not use towel bars as grab bars. Use non-skid mats or decals in the tub or shower. If you need to sit down in the shower, use a plastic, non-slip stool. Keep the floor dry. Clean up any water that spills on the floor as soon as it happens. Remove  soap buildup in the tub or shower regularly. Attach bath mats securely with double-sided non-slip rug tape. Do not have throw rugs and other things on the floor that can make you trip. What can I do in the bedroom? Use night lights. Make sure that you have a light by your bed that is easy to reach. Do not use any sheets or blankets that are too big for your bed. They should not hang down onto the floor. Have a firm chair that has side arms. You can use this for support while you get dressed. Do not have throw rugs and other things on the floor that can make you trip. What can I do in the kitchen? Clean up any spills right away. Avoid walking on wet floors. Keep items that you use a lot in easy-to-reach places. If you need to reach something above you, use a strong step stool that has a grab bar. Keep electrical cords out of the way. Do not use floor polish or wax that makes floors slippery. If you must use wax, use non-skid floor wax. Do not have throw rugs and other things on the floor that can make you trip. What can I do with my stairs? Do not leave any items on the stairs. Make sure that there are handrails on both sides of the stairs and use them. Fix handrails that are broken or loose. Make sure that handrails are  as long as the stairways. Check any carpeting to make sure that it is firmly attached to the stairs. Fix any carpet that is loose or worn. Avoid having throw rugs at the top or bottom of the stairs. If you do have throw rugs, attach them to the floor with carpet tape. Make sure that you have a light switch at the top of the stairs and the bottom of the stairs. If you do not have them, ask someone to add them for you. What else can I do to help prevent falls? Wear shoes that: Do not have high heels. Have rubber bottoms. Are comfortable and fit you well. Are closed at the toe. Do not wear sandals. If you use a stepladder: Make sure that it is fully opened. Do not climb a  closed stepladder. Make sure that both sides of the stepladder are locked into place. Ask someone to hold it for you, if possible. Clearly mark and make sure that you can see: Any grab bars or handrails. First and last steps. Where the edge of each step is. Use tools that help you move around (mobility aids) if they are needed. These include: Canes. Walkers. Scooters. Crutches. Turn on the lights when you go into a dark area. Replace any light bulbs as soon as they burn out. Set up your furniture so you have a clear path. Avoid moving your furniture around. If any of your floors are uneven, fix them. If there are any pets around you, be aware of where they are. Review your medicines with your doctor. Some medicines can make you feel dizzy. This can increase your chance of falling. Ask your doctor what other things that you can do to help prevent falls. This information is not intended to replace advice given to you by your health care provider. Make sure you discuss any questions you have with your health care provider. Document Released: 02/28/2009 Document Revised: 10/10/2015 Document Reviewed: 06/08/2014 Elsevier Interactive Patient Education  2017 [employer].

## 2021-02-03 ENCOUNTER — Telehealth: Payer: Self-pay | Admitting: Cardiovascular Disease

## 2021-02-03 ENCOUNTER — Other Ambulatory Visit: Payer: Self-pay

## 2021-02-03 DIAGNOSIS — E782 Mixed hyperlipidemia: Secondary | ICD-10-CM

## 2021-02-03 MED ORDER — ROSUVASTATIN CALCIUM 20 MG PO TABS: 20.0000 mg | ORAL_TABLET | Freq: Every day | ORAL | 3 refills | Status: DC

## 2021-02-03 NOTE — Telephone Encounter (Signed)
Patient returning call to discuss lab results  ?

## 2021-02-03 NOTE — Telephone Encounter (Signed)
Called patient, gave lab results.  Ordered blood work and medication to pharmacy.   Patient verbalized understanding.

## 2021-02-17 ENCOUNTER — Other Ambulatory Visit: Payer: Self-pay

## 2021-02-17 ENCOUNTER — Ambulatory Visit (INDEPENDENT_AMBULATORY_CARE_PROVIDER_SITE_OTHER): Payer: Medicare Other | Admitting: Podiatry

## 2021-02-17 DIAGNOSIS — B351 Tinea unguium: Secondary | ICD-10-CM

## 2021-02-17 DIAGNOSIS — E1151 Type 2 diabetes mellitus with diabetic peripheral angiopathy without gangrene: Secondary | ICD-10-CM | POA: Diagnosis not present

## 2021-02-17 DIAGNOSIS — M79675 Pain in left toe(s): Secondary | ICD-10-CM

## 2021-02-17 DIAGNOSIS — Q828 Other specified congenital malformations of skin: Secondary | ICD-10-CM

## 2021-02-17 DIAGNOSIS — M79674 Pain in right toe(s): Secondary | ICD-10-CM | POA: Diagnosis not present

## 2021-02-19 ENCOUNTER — Telehealth: Payer: Self-pay | Admitting: Family Medicine

## 2021-02-19 ENCOUNTER — Telehealth: Payer: Self-pay | Admitting: *Deleted

## 2021-02-19 ENCOUNTER — Telehealth: Payer: Self-pay

## 2021-02-19 DIAGNOSIS — R519 Headache, unspecified: Secondary | ICD-10-CM | POA: Insufficient documentation

## 2021-02-19 DIAGNOSIS — R0981 Nasal congestion: Secondary | ICD-10-CM | POA: Insufficient documentation

## 2021-02-19 NOTE — Telephone Encounter (Signed)
-----   Message from Lorretta Harp, MD sent at 01/17/2021  3:13 PM EDT ----- LDL 178 and Cor CA score elevated not on statin Rx. Start Crestor 20 mg and recheck FLP 2 months

## 2021-02-19 NOTE — Telephone Encounter (Signed)
Returned the call to patient to schedule, no answer, could not leave vmessage.

## 2021-02-19 NOTE — Telephone Encounter (Signed)
Patient is calling because she is still having problems with her right heel, barely can put pressure on it,was told that if this problem continues to schedule an upcoming  appointment since we  did not have lights at time of last visit.Please advise /schedule for appointment.

## 2021-02-19 NOTE — Telephone Encounter (Signed)
Returned the call to patient to schedule for 02/21/21,no answer, left vmessage to call back to confirm appointment.

## 2021-02-19 NOTE — Chronic Care Management (AMB) (Signed)
  Chronic Care Management   Note  02/19/2021 Name: Angela Pugh MRN: [id number] DOB: 11/04/1945  Angela Pugh is a 75 y.o. year old female who is a primary care patient of [name], MD. I reached out to [name] by phone today in response to a referral sent by Angela Pugh's PCP, [name], MD.   Angela Pugh was given information about Chronic Care Management services today including:  CCM service includes personalized support from designated clinical staff supervised by her physician, including individualized plan of care and coordination with other care providers 24/7 contact phone numbers for assistance for urgent and routine care needs. Service will only be billed when office clinical staff spend 20 minutes or more in a month to coordinate care. Only one practitioner may furnish and bill the service in a calendar month. The patient may stop CCM services at any time (effective at the end of the month) by phone call to the office staff.   Patient agreed to services and verbal consent obtained.   Follow up plan:   Tatjana Secretary/administrator

## 2021-02-20 ENCOUNTER — Encounter: Payer: Self-pay | Admitting: Podiatry

## 2021-02-20 NOTE — Telephone Encounter (Signed)
Patient has been scheduled for tomorrow a@8 :00 am.

## 2021-02-20 NOTE — Progress Notes (Signed)
Subjective: Angela Pugh is a 75 y.o. female patient seen today for diabetic foot care. She is seen for painful porokeratoses b/l feet and  painful thick toenails that are difficult to trim. Pain interferes with ambulation. Aggravating factors include wearing enclosed shoe gear. Pain is relieved with periodic professional debridement.  Patient states her right heel is very painful. Pain is interfering with ambulation.   New problems reported today: None.  PCP is [name], MD. Last visit was: 12/04/2020.  Allergies  Allergen Reactions   Acetaminophen Hives and Nausea Only   Atorvastatin Nausea Only    weakness   Azithromycin Hives and Nausea And Vomiting   Zyrtec [Cetirizine]     Hives    Objective: Physical Exam  General: Patient is a pleasant 75 y.o. African American female WD, WN in NAD. AAO x 3.   Neurovascular Examination: Capillary refill time to digits immediate b/l. Palpable DP pulse(s) b/l lower extremities Nonpalpable PT pulse(s) b/l lower extremities. Pedal hair sparse. Lower extremity skin temperature gradient within normal limits. No edema noted b/l lower extremities.  Protective sensation intact 5/5 intact bilaterally with 10g monofilament b/l. Vibratory sensation intact b/l.  Dermatological:  Skin warm and supple b/l lower extremities. No open wounds b/l lower extremities. No interdigital macerations b/l lower extremities. Toenails 1-5 b/l elongated, discolored, dystrophic, thickened, crumbly with subungual debris and tenderness to dorsal palpation. Porokeratotic lesion(s) L hallux, R hallux, submet head 2 left foot, submet head 2 right foot, and plantar aspect of heel right foot. No erythema, no edema, no drainage, no fluctuance.  Musculoskeletal:  Normal muscle strength 5/5 to all lower extremity muscle groups bilaterally. Hammertoe(s) noted to the L 5th toe and R 5th toe.  Assessment: 1. Pain due to onychomycosis of toenails of both feet   2.  Porokeratosis   3. Type II diabetes mellitus with peripheral circulatory disorder [facility])    Plan: Patient was evaluated and treated and all questions answered. Consent given for treatment as described below: -Examined patient. -Continue diabetic foot care principles: inspect feet daily, monitor glucose as recommended by PCP and/or Endocrinologist, and follow prescribed diet per PCP, Endocrinologist and/or dietician. -Patient to continue soft, supportive shoe gear daily. -Toenails 1-5 b/l were debrided in length and girth with sterile nail nippers and dremel without iatrogenic bleeding.  -Painful porokeratotic lesion(s) L hallux, R hallux, submet head 2 left foot, submet head 2 right foot, and plantar aspect of heel right foot pared and enucleated with sterile scalpel blade.Total number of lesions debrided=5. Pinpoint bleeding addressed with Lumicain Hemostatic Solution. Triple antibiotic ointment and light dressing applied. She is to apply Neosporin to right heel once daily for one week. Call office if she has any problems. -Patient to report any pedal injuries to medical professional immediately. -Patient/POA to call should there be question/concern in the interim.  Return in about 3 months (around 05/20/2021).  Marzetta Board, DPM

## 2021-02-21 ENCOUNTER — Other Ambulatory Visit: Payer: Self-pay

## 2021-02-21 ENCOUNTER — Ambulatory Visit (INDEPENDENT_AMBULATORY_CARE_PROVIDER_SITE_OTHER): Payer: Medicare Other | Admitting: Podiatry

## 2021-02-21 ENCOUNTER — Encounter: Payer: Self-pay | Admitting: Podiatry

## 2021-02-21 DIAGNOSIS — E1151 Type 2 diabetes mellitus with diabetic peripheral angiopathy without gangrene: Secondary | ICD-10-CM

## 2021-02-21 DIAGNOSIS — Q828 Other specified congenital malformations of skin: Secondary | ICD-10-CM

## 2021-02-21 NOTE — Progress Notes (Signed)
Subjective: Angela Pugh is a 75 y.o. female patient seen today with continued heel pain right heel. She was here in the office when we had lighting issues and desired to be seen on that day. She states she has pain on plantarcentral right heel.  Patient states her right heel is very painful. Pain is interfering with ambulation.   New problems reported today: None.  PCP is [name], MD. Last visit was: 02/17/2021  Allergies  Allergen Reactions   Acetaminophen Hives and Nausea Only   Atorvastatin Nausea Only    weakness   Azithromycin Hives and Nausea And Vomiting   Zyrtec [Cetirizine]     Hives    Objective: Physical Exam  General: Patient is a pleasant 75 y.o. African American female WD, WN in NAD. AAO x 3.   Neurovascular Examination: Capillary refill time to digits immediate b/l. Palpable DP pulse(s) b/l lower extremities Nonpalpable PT pulse(s) b/l lower extremities. Pedal hair sparse. Lower extremity skin temperature gradient within normal limits. No edema noted b/l lower extremities.  Protective sensation intact 5/5 intact bilaterally with 10g monofilament b/l. Vibratory sensation intact b/l.  Dermatological:  Skin warm and supple b/l lower extremities. No open wounds b/l lower extremities. No interdigital macerations b/l lower extremities. Toenails recently debrided. Porokeratotic lesion(s) plantarcentral heel x 2, submet head 2 left foot, and submet head 2 right foot. No erythema, no edema, no drainage, no fluctuance.  Musculoskeletal:  Normal muscle strength 5/5 to all lower extremity muscle groups bilaterally. Hammertoe(s) noted to the L 5th toe and R 5th toe.  Assessment: 1. Porokeratosis   2. Type II diabetes mellitus with peripheral circulatory disorder [facility])    Plan: Patient was evaluated and treated and all questions answered. Consent given for treatment as described below: -Examined patient. -Continue diabetic foot care principles: inspect feet  daily, monitor glucose as recommended by PCP and/or Endocrinologist, and follow prescribed diet per PCP, Endocrinologist and/or dietician. -Patient to continue soft, supportive shoe gear daily. -Toenails 1-5 b/l were debrided in length and girth with sterile nail nippers and dremel without iatrogenic bleeding. -Additional porokeratotic lesion(s) plantar aspect of heel right foot pared and enucleated with sterile scalpel blade.No charge on today's visit. Pinpoint bleeding addressed with Lumicain Hemostatic Solution. Triple antibiotic ointment and light dressing applied. She is to apply Neosporin to right heel once daily for one week. Call office if she has any problems. -Patient to report any pedal injuries to medical professional immediately. -Patient/POA to call should there be question/concern in the interim.  Keep previously scheduled appt.  Marzetta Board, DPM

## 2021-03-05 ENCOUNTER — Ambulatory Visit: Payer: Medicare Other | Admitting: Gastroenterology

## 2021-03-05 ENCOUNTER — Encounter: Payer: Self-pay | Admitting: Gastroenterology

## 2021-03-05 VITALS — BP 134/76 | HR 60 | Ht 66.25 in | Wt 169.0 lb

## 2021-03-05 DIAGNOSIS — J3489 Other specified disorders of nose and nasal sinuses: Secondary | ICD-10-CM | POA: Diagnosis not present

## 2021-03-05 DIAGNOSIS — K5790 Diverticulosis of intestine, part unspecified, without perforation or abscess without bleeding: Secondary | ICD-10-CM | POA: Diagnosis not present

## 2021-03-05 DIAGNOSIS — R0981 Nasal congestion: Secondary | ICD-10-CM | POA: Diagnosis not present

## 2021-03-05 DIAGNOSIS — R519 Headache, unspecified: Secondary | ICD-10-CM | POA: Diagnosis not present

## 2021-03-05 DIAGNOSIS — M8508 Fibrous dysplasia (monostotic), other site: Secondary | ICD-10-CM | POA: Diagnosis not present

## 2021-03-05 NOTE — Progress Notes (Signed)
Review of pertinent gastrointestinal problems: 1.  Generalized abdominal pain led to a variety of tests 2021 including CT scan November 2021 showed thick walled sigmoid colon without diverticulitis.  Pancreatic atrophy with mild dilation of the main PD.  MRI with MRCP November 2021 showed "stable mild dorsal pancreatic duct dilation, no change since 2012."  EGD December 2021 which showed blue blood type vascular findings in the distal esophagus, mild gastritis, negative for H. pylori on biopsy also colonoscopy December 2021 that was complete to the cecum.  Multiple diverticulosis in the left colon. There was a significant diverticular associated luminal narrowing spasticity and erythema in the left colon and I felt it was possible that that was the source of her abdominal discomforts.  She was started on Lialda 4 pills once daily.  This regimen significantly improved her abdominal pains, decreased her bloating.  Moving her bowels regularly.  Very likely she has "diverticular associated colitis" 2.  Chronic GERD without alarm symptoms.  EGD December 2021.  Proton pump inhibitor once daily is effective at controlling her symptoms.  HPI: This is a very pleasant 75 year old woman   I last saw her about 3 months ago, July 2022.  She had noticed a recent change in her bowel habits with loosening of her stools.  This occurred about a month after antibiotics for an ear infection.  GI pathogen panel July 2022 was negative.  I recommended she start taking a single Imodium shortly after waking every morning and another 1 later in the day and continue her Lialda 4 pills once daily.  Her weight is up 4 pounds since her last office visit here 3 months ago.  Blood work August 2022 showed normal LFTs.  Since her last visit she has been doing very very well.  Her bowels are really evening out.  She rarely takes Imodium at all.  She is however religious about taking her Lialda 4 pills once daily.  On this regimen she is  having 2-3 soft formed bowel movements daily.  Her bloating is gone just about.  She is very happy that she is passing flatus quite a bit easier now. ROS: complete GI ROS as described in HPI, all other review negative.  Constitutional:  No unintentional weight loss   Past Medical History:  Diagnosis Date   Arthritis    back, knees, shoulder, hands , injections in pelvis, for bone spurs (05/04/2016)   Chronic back pain    Diverticulosis    History of kidney stones    HLD (hyperlipidemia)    Hypertension    Migraine    hx (05/04/2016)   Peripheral neuropathy    Radiculopathy    Sinus complaint    Sinus headache    occasional (05/04/2016)   Type II diabetes mellitus ([place])    diet controlled, no meds now, was on insulin & po treatment at one time     Vertigo     Past Surgical History:  Procedure Laterality Date   ANTERIOR CERVICAL DECOMP/DISCECTOMY FUSION N/A 04/17/2016   Procedure: ANTERIOR CERVICAL DECOMPRESSION/DISCECTOMY FUSION CERVICAL THREE - CERVICAL FOUR, POSTERIOR CERVICAL DISCECTOMY CERVICAL SEVEN -THORASIC ONE LEFT;  Surgeon: Ashok Pall, MD;  Location: [facility];  Service: Neurosurgery;  Laterality: N/A;  Anterior/Posterior   ANTERIOR FUSION CERVICAL SPINE  2000   Dr. Carloyn Manner; "for bone spurs; put hardware in too"   BACK SURGERY     COLONOSCOPY  06/10/2016   [facility]   LEFT HEART CATH AND CORONARY ANGIOGRAPHY N/A 03/17/2018  Procedure: LEFT HEART CATH AND CORONARY ANGIOGRAPHY;  Surgeon: Lorretta Harp, MD;  Location: [facility];  Service: Cardiovascular;  Laterality: N/A;   LUMBAR LAMINECTOMY Left 06/30/2013   Procedure: Left L3-4 Extraforaminal approach to excise far lateral herniated nucleus pulposus;  Surgeon: Jessy Oto, MD;  Location: [facility];  Service: Orthopedics;  Laterality: Left;   TUBAL LIGATION     VAGINAL HYSTERECTOMY      Current Outpatient Medications  Medication Sig Dispense Refill   amLODipine (NORVASC) 5 MG tablet TAKE 1 TABLET(5 MG) BY MOUTH  DAILY 15 tablet 0   amoxicillin-clavulanate (AUGMENTIN) 875-125 MG tablet Take 1 tablet by mouth 2 (two) times daily. (Patient not taking: Reported on 01/28/2021) 20 tablet 0   Aromatic Inhalants (VICKS VAPOR INHALER IN) Inhale 1 puff into the lungs as needed.     aspirin 81 MG EC tablet Take 81 mg by mouth daily. Swallow whole.     Calcium Carb-Cholecalciferol (CALCIUM 1000 + D PO)      famotidine (PEPCID) 20 MG tablet Take 1 tablet (20 mg total) by mouth 3 times/day as needed-between meals & bedtime for heartburn or indigestion. 30 tablet 0   fexofenadine (ALLEGRA ALLERGY) 180 MG tablet Take 1 tablet (180 mg total) by mouth daily. 30 tablet 3   hyoscyamine (LEVSIN SL) 0.125 MG SL tablet Take 1 to 2 tablets under the tongue as needed for cramping and abdominal bloating. 50 tablet 3   mesalamine (LIALDA) 1.2 g EC tablet Take 4 tablets (4.8 g total) by mouth daily with breakfast. 360 tablet 3   Multiple Vitamins-Minerals (CENTRUM ADULTS PO) Take 1 tablet by mouth daily.      olmesartan (BENICAR) 5 MG tablet Take 10 mg by mouth daily.     pantoprazole (PROTONIX) 40 MG tablet Take 1 tablet (40 mg total) by mouth daily. 90 tablet 3   potassium chloride SA (KLOR-CON) 20 MEQ tablet Take 20 mEq by mouth daily.     rosuvastatin (CRESTOR) 20 MG tablet Take 1 tablet (20 mg total) by mouth daily. 90 tablet 3   triamcinolone cream (KENALOG) 0.5 % APPLY EXTERNALLY TO THE AFFECTED AREA TWICE DAILY AS NEEDED FOR DRY SKIN 30 g 3   No current facility-administered medications for this visit.    Allergies as of 03/05/2021 - Review Complete 03/05/2021  Allergen Reaction Noted   Acetaminophen Hives and Nausea Only 07/02/2010   Azithromycin Hives and Nausea And Vomiting 10/18/2017   Lipitor [atorvastatin] Nausea Only 02/01/2016   Zyrtec [cetirizine]  08/25/2019    Family History  Problem Relation Age of Onset   Uterine cancer Mother    Diabetes type II Sister    Diabetes Mellitus II Brother    Colon cancer  Neg Hx    Pancreatic cancer Neg Hx    Esophageal cancer Neg Hx    Colon polyps Neg Hx    Rectal cancer Neg Hx    Stomach cancer Neg Hx     Social History   Socioeconomic History   Marital status: Widowed    Spouse name: Not on file   Number of children: Not on file   Years of education: Not on file   Highest education level: Not on file  Occupational History   Not on file  Tobacco Use   Smoking status: Former    Packs/day: 0.50    Years: 20.00    Pack years: 10.00    Types: Cigarettes    Quit date: 06/30/1982  Years since quitting: 38.7   Smokeless tobacco: Never  Vaping Use   Vaping Use: Never used  Substance and Sexual Activity   Alcohol use: Not Currently    Comment: 05/04/2016 "might have a drink a couple days/year; might not"   Drug use: No   Sexual activity: Not Currently  Other Topics Concern   Not on file  Social History Narrative   Not on file   Social Determinants of Health   Financial Resource Strain: Low Risk    Difficulty of Paying Living Expenses: Not hard at all  Food Insecurity: No Food Insecurity   Worried About Charity fundraiser in the Last Year: Never true   [place] in the Last Year: Never true  Transportation Needs: No Transportation Needs   Lack of Transportation (Medical): No   Lack of Transportation (Non-Medical): No  Physical Activity: Insufficiently Active   Days of Exercise per Week: 5 days   Minutes of Exercise per Session: 20 min  Stress: No Stress Concern Present   Feeling of Stress : Not at all  Social Connections: Moderately Isolated   Frequency of Communication with Friends and Family: More than three times a week   Frequency of Social Gatherings with Friends and Family: More than three times a week   Attends Religious Services: More than 4 times per year   Active Member of [employer] or Organizations: No   Attends Archivist Meetings: Never   Marital Status: Widowed  Human resources officer Violence: Not At Risk    Fear of Current or Ex-Partner: No   Emotionally Abused: No   Physically Abused: No   Sexually Abused: No     Physical Exam: Ht 5' 6.25" (1.683 m) Comment: height measured without shoes  Wt 169 lb (76.7 kg)   BMI 27.07 kg/m  Constitutional: generally well-appearing Psychiatric: alert and oriented x3 Abdomen: soft, nontender, nondistended, no obvious ascites, no peritoneal signs, normal bowel sounds No peripheral edema noted in lower extremities  Assessment and plan: 75 y.o. female with diverticular associated colitis  Her symptoms are much improved on Lialda 4 pills once daily.  She will continue this regimen indefinitely.  I would like to see her again in 1 year and she knows to call sooner if she has any troubles prior to then.  Please see the "Patient Instructions" section for addition details about the plan.  Owens Loffler, MD [facility] 03/05/2021, 9:09 AM   Total time on date of encounter was 30 minutes (this included time spent preparing to see the patient reviewing records; obtaining and/or reviewing separately obtained history; performing a medically appropriate exam and/or evaluation; counseling and educating the patient and family if present; ordering medications, tests or procedures if applicable; and documenting clinical information in the health record).

## 2021-03-05 NOTE — Patient Instructions (Signed)
If you are age 74 or older, your body mass index should be between 23-30. Your Body mass index is 27.07 kg/m. If this is out of the aforementioned range listed, please consider follow up with your Primary Care Provider. ________________________________________________________  The [facility] providers would like to encourage you to use [facility] to communicate with providers for non-urgent requests or questions.  Due to long hold times on the telephone, sending your provider a message by [facility] may be a faster and more efficient way to get a response.  Please allow 48 business hours for a response.  Please remember that this is for non-urgent requests.  _______________________________________________________  Dennis Bast should follow up in our office in 1 year (October 2023.)  We will contact you to schedule this appointment.  Continue Lialda 4 pills daily.  Thank you for entrusting me with your care and choosing [facility].  Dr Ardis Hughs

## 2021-03-10 ENCOUNTER — Other Ambulatory Visit: Payer: Self-pay | Admitting: Family Medicine

## 2021-03-10 DIAGNOSIS — Z1231 Encounter for screening mammogram for malignant neoplasm of breast: Secondary | ICD-10-CM

## 2021-04-15 ENCOUNTER — Ambulatory Visit
Admission: RE | Admit: 2021-04-15 | Discharge: 2021-04-15 | Disposition: A | Discharge status: Home / Self Care | Payer: Medicare Other | Source: Ambulatory Visit | Attending: Family Medicine | Admitting: Family Medicine

## 2021-04-15 ENCOUNTER — Other Ambulatory Visit: Payer: Self-pay

## 2021-04-15 DIAGNOSIS — Z1231 Encounter for screening mammogram for malignant neoplasm of breast: Secondary | ICD-10-CM | POA: Diagnosis not present

## 2021-04-18 ENCOUNTER — Telehealth: Payer: Self-pay | Admitting: Pharmacist

## 2021-04-18 NOTE — Chronic Care Management (AMB) (Signed)
Chronic Care Management Pharmacy Assistant   Name: Mayuri Staples  MRN: [id number] DOB: 1945/06/16  Angela Pugh is an 75 y.o. year old female who presents for his initial CCM visit with the clinical pharmacist.  Reason for Encounter: Chart Prep for initial visit with Jeni Salles the clinical pharmacist on 04/21/2021 at 3:00 in the office. Unable to reach patient, appointment reminder was left on her home phone.    Conditions to be addressed/monitored: CAD, HTN, COPD, and DMII   Recent office visits:  12/04/2020  -Grier Mitts MD - Patient was seen for dysfunction of both eustachian tubes and additional issues. Started on Amoxicillin 500 mg twice daily. Follow up if symptoms worsen or fail to improve.  Recent consult visits:  03/05/2021 Pietro Cassis PA-C (ENT) - Patient was seen for a headache. No medication changes. Follow up as needed.  03/05/2021 - Owens Loffler MD (GI) - Patient was seen for diverticulosis. Discontinued Augmentin. Follow up in 1 year.  02/21/2021 Acquanetta Sit DPM (podiatry) - Patient was seen for porokeratosis and an additional issues. No medication changes. No follow up noted.  02/19/2021 - Pietro Cassis PA-C (ENT) - Patient was seen for a headache. No medication changes. Follow up in 2 weeks.  02/17/2021 - Acquanetta Sit DPM (podiatry) - Patient was seen for pain due to onychomycosis and additional issues. No Medication changes. Follow up in 3 months.  01/15/2021 - Quay Burow MD (cardiology) - Patient was seen for mixed hyperlipidemia and additional issues. No medication changes. Follow up in 12 months.  01/09/2021 - Colin Benton DO (family medicine) - Patient was seen for sinus congestion and additional issues. Started on Augmentin 875/125 mg twice daily. No follow up noted.  12/04/2020 -  Owens Loffler MD (GI)  - Patient was seen for diarrhea. No medication changes. No follow up noted.  11/12/2020 - Acquanetta Sit DPM  (podiatry) - Patient was seen for pain due to onychomycosis and additional issues. No Medication changes. Follow up in 3 months  Hospital visits:  None  Medications: Outpatient Encounter Medications as of 04/18/2021  Medication Sig   amLODipine (NORVASC) 5 MG tablet TAKE 1 TABLET(5 MG) BY MOUTH DAILY   Aromatic Inhalants (VICKS VAPOR INHALER IN) Inhale 1 puff into the lungs as needed.   aspirin 81 MG EC tablet Take 81 mg by mouth daily. Swallow whole.   Calcium Carb-Cholecalciferol (CALCIUM 1000 + D PO)    famotidine (PEPCID) 20 MG tablet Take 1 tablet (20 mg total) by mouth 3 times/day as needed-between meals & bedtime for heartburn or indigestion.   fexofenadine (ALLEGRA ALLERGY) 180 MG tablet Take 1 tablet (180 mg total) by mouth daily.   hyoscyamine (LEVSIN SL) 0.125 MG SL tablet Take 1 to 2 tablets under the tongue as needed for cramping and abdominal bloating.   mesalamine (LIALDA) 1.2 g EC tablet Take 4 tablets (4.8 g total) by mouth daily with breakfast.   Multiple Vitamins-Minerals (CENTRUM ADULTS PO) Take 1 tablet by mouth daily.    olmesartan (BENICAR) 5 MG tablet Take 10 mg by mouth daily.   pantoprazole (PROTONIX) 40 MG tablet Take 1 tablet (40 mg total) by mouth daily.   potassium chloride SA (KLOR-CON) 20 MEQ tablet Take 20 mEq by mouth daily.   rosuvastatin (CRESTOR) 20 MG tablet Take 1 tablet (20 mg total) by mouth daily.   triamcinolone cream (KENALOG) 0.5 % APPLY EXTERNALLY TO THE AFFECTED AREA TWICE DAILY AS NEEDED FOR DRY SKIN   No  facility-administered encounter medications on file as of 04/18/2021.  Fill History: MESALAMINE 1.2GM TABLETS 03/18/2021 90   POTASSIUM CL 20MEQ ER TABLETS 01/12/2021 90   ROSUVASTATIN 20MG  TABLETS 02/03/2021 90   Have you seen any other providers since your last visit? **  Any changes in your medications or health?   Any side effects from any medications?   Do you have an symptoms or problems not managed by your medications?   Any  concerns about your health right now?   Has your provider asked that you check blood pressure, blood sugar, or follow special diet at home?   Do you get any type of exercise on a regular basis?   Can you think of a goal you would like to reach for your health?   Do you have any problems getting your medications?   Is there anything that you would like to discuss during the appointment?    Care Gaps: AWV - scheduled for 02/10/2022 Last BP - 144/72 on 03/05/2021 Last A1C - 6.3 on 10/03/2020 Pneumovax - never done Hep C screen - never done TDAP - never done Covid vaccine - overdue Foot exam - over due  Angela Rating Drugs: Olmesartan 5 mg - last filled 04/15/2020 at [facility], verified with pharmacist.  Crestor 20 mg - last filled 02/03/2021 at [facility] [phone]

## 2021-04-19 ENCOUNTER — Other Ambulatory Visit: Payer: Self-pay | Admitting: Cardiovascular Disease

## 2021-04-21 ENCOUNTER — Ambulatory Visit: Payer: Medicare Other | Admitting: Pharmacist

## 2021-04-21 VITALS — BP 142/70

## 2021-04-21 DIAGNOSIS — I10 Essential (primary) hypertension: Secondary | ICD-10-CM

## 2021-04-21 DIAGNOSIS — E782 Mixed hyperlipidemia: Secondary | ICD-10-CM

## 2021-04-21 NOTE — Progress Notes (Signed)
Chronic Care Management Pharmacy Note  04/22/2021 Name:  Angela Pugh MRN:  [id number] DOB:  01/23/1946  Summary: LDL not at goal < 100 BP not ideally at goal < 140/90  Recommendations/Changes made from today's visit: -Recommend repeat vitamin D level -Reached out to cardiology to switch Crestor to Lipitor -Recommend repeat lipid panel 6-12 weeks after change in cholesterol medication  Plan: BP assessment in 1-2 months   Subjective: Angela Pugh is an 75 y.o. year old female who is a primary patient of [name], MD.  The CCM team was consulted for assistance with disease management and care coordination needs.    Engaged with patient face to face for initial visit in response to provider referral for pharmacy case management and/or care coordination services.   Consent to Services:  The patient was given the following information about Chronic Care Management services today, agreed to services, and gave verbal consent: 1. CCM service includes personalized support from designated clinical staff supervised by the primary care provider, including individualized plan of care and coordination with other care providers 2. 24/7 contact phone numbers for assistance for urgent and routine care needs. 3. Service will only be billed when office clinical staff spend 20 minutes or more in a month to coordinate care. 4. Only one practitioner may furnish and bill the service in a calendar month. 5.The patient may stop CCM services at any time (effective at the end of the month) by phone call to the office staff. 6. The patient will be responsible for cost sharing (co-pay) of up to 20% of the service fee (after annual deductible is met). Patient agreed to services and consent obtained.  Patient Care Team: [name], MD as PCP - General (Family Medicine) Bensimhon, Shaune Pascal, MD as PCP - Advanced Heart Failure (Cardiology) Viona Gilmore, [facility] as Pharmacist  (Pharmacist)  Recent office visits: 01/28/21 Charlott Rakes, LPN: Patient presented for AWV.  01/09/2021 - Colin Benton DO (family medicine) - Patient was seen for sinus congestion and additional issues. Started on Augmentin 875/125 mg twice daily. No follow up noted.  12/04/2020  -Grier Mitts MD - Patient was seen for dysfunction of both eustachian tubes and additional issues. Started on Amoxicillin 500 mg twice daily. Follow up if symptoms worsen or fail to improve.  Recent consult visits: 03/05/2021 Pietro Cassis PA-C (ENT) - Patient was seen for a headache. No medication changes. Follow up as needed.   03/05/2021 - Owens Loffler MD (GI) - Patient was seen for diverticulosis. Discontinued Augmentin. Follow up in 1 year.   02/21/2021 Acquanetta Sit DPM (podiatry) - Patient was seen for porokeratosis and additional issues. No medication changes. No follow up noted.   02/19/2021 - Pietro Cassis PA-C (ENT) - Patient was seen for a headache. No medication changes. Follow up in 2 weeks.   02/17/2021 - Acquanetta Sit DPM (podiatry) - Patient was seen for pain due to onychomycosis and additional issues. No Medication changes. Follow up in 3 months.   01/15/2021 - Quay Burow MD (cardiology) - Patient was seen for mixed hyperlipidemia and additional issues. No medication changes. Follow up in 12 months.   12/04/2020 -  Owens Loffler MD (GI)  - Patient was seen for diarrhea. No medication changes. No follow up noted.   11/12/2020 - Acquanetta Sit DPM (podiatry) - Patient was seen for pain due to onychomycosis and additional issues. No Medication changes. Follow up in 3 months.  Hospital visits: None in previous 6  months   Objective:  Lab Results  Component Value Date   CREATININE 1.04 10/03/2020   BUN 14 10/03/2020   GFR 53.10 (L) 10/03/2020   GFRNONAA 57 (L) 01/12/2020   GFRAA 66 01/12/2020   NA 140 10/03/2020   K 4.5 10/03/2020   CALCIUM 9.7 10/03/2020   CO2 32  10/03/2020   GLUCOSE 114 (H) 10/03/2020    Lab Results  Component Value Date/Time   HGBA1C 6.3 10/03/2020 09:43 AM   HGBA1C 6.1 (H) 01/12/2020 11:31 AM   GFR 53.10 (L) 10/03/2020 09:43 AM   GFR 57.24 (L) 03/18/2020 11:41 AM    Last diabetic Eye exam:  Lab Results  Component Value Date/Time   HMDIABEYEEXA No Retinopathy 08/13/2020 12:03 PM    Last diabetic Foot exam: No results found for: HMDIABFOOTEX   Lab Results  Component Value Date   CHOL 253 (H) 01/15/2021   HDL 51 01/15/2021   LDLCALC 178 (H) 01/15/2021   TRIG 133 01/15/2021   CHOLHDL 5.0 (H) 01/15/2021    Hepatic Function Latest Ref Rng & Units 01/15/2021 10/03/2020 03/18/2020  Total Protein 6.0 - 8.5 g/dL 7.6 7.5 7.7  Albumin 3.7 - 4.7 g/dL 4.6 4.2 4.2  AST 0 - 40 IU/L '14 13 14  ' ALT 0 - 32 IU/L '11 10 11  ' Alk Phosphatase 44 - 121 IU/L 73 66 62  Total Bilirubin 0.0 - 1.2 mg/dL 0.3 0.5 0.3  Bilirubin, Direct 0.00 - 0.40 mg/dL 0.11 - -    Lab Results  Component Value Date/Time   TSH 1.19 10/03/2020 09:43 AM   TSH 1.09 01/12/2020 11:31 AM   FREET4 0.92 10/03/2020 09:43 AM   FREET4 1.1 01/12/2020 11:31 AM    CBC Latest Ref Rng & Units 10/03/2020 03/18/2020 01/12/2020  WBC 4.0 - 10.5 K/uL 5.5 6.5 5.9  Hemoglobin 12.0 - 15.0 g/dL 14.1 14.0 14.2  Hematocrit 36.0 - 46.0 % 41.0 40.8 43.2  Platelets 150.0 - 400.0 K/uL 313.0 313.0 259    Lab Results  Component Value Date/Time   VD25OH 27 (L) 01/12/2020 11:31 AM    Clinical ASCVD: Yes  The 10-year ASCVD risk score (Arnett DK, et al., 2019) is: 66.2%   Values used to calculate the score:     Age: 13 years     Sex: Female     Is Non-Hispanic African American: Yes     Diabetic: Yes     Tobacco smoker: Yes     Systolic Blood Pressure: 383 mmHg     Is BP treated: Yes     HDL Cholesterol: 51 mg/dL     Total Cholesterol: 253 mg/dL    Depression screen [place] 2/9 01/28/2021 02/21/2020 02/16/2019  Decreased Interest 0 0 0  Down, Depressed, Hopeless 0 0 0  PHQ - 2 Score 0  0 0  Altered sleeping - 0 -  Tired, decreased energy - 0 -  Change in appetite - 0 -  Feeling bad or failure about yourself  - 0 -  Trouble concentrating - 0 -  Moving slowly or fidgety/restless - 0 -  Suicidal thoughts - 0 -  PHQ-9 Score - 0 -  Difficult doing work/chores - Not difficult at all -  Some recent data might be hidden      Social History   Tobacco Use  Smoking Status Former   Packs/day: 0.50   Years: 20.00   Pack years: 10.00   Types: Cigarettes   Quit date: 06/30/1982   Years since quitting:  38.8  Smokeless Tobacco Never   BP Readings from Last 3 Encounters:  04/21/21 (!) 142/70  03/05/21 134/76  01/28/21 128/68   Pulse Readings from Last 3 Encounters:  03/05/21 60  01/28/21 (!) 55  01/15/21 (!) 55   Wt Readings from Last 3 Encounters:  03/05/21 169 lb (76.7 kg)  01/28/21 165 lb (74.8 kg)  01/15/21 164 lb 6.4 oz (74.6 kg)   BMI Readings from Last 3 Encounters:  03/05/21 27.07 kg/m  01/28/21 24.37 kg/m  01/15/21 24.28 kg/m    Assessment/Interventions: Review of patient past medical history, allergies, medications, health status, including review of consultants reports, laboratory and other test data, was performed as part of comprehensive evaluation and provision of chronic care management services.   SDOH:  (Social Determinants of Health) assessments and interventions performed: Yes SDOH Interventions    Flowsheet Row Most Recent Value  SDOH Interventions   Financial Strain Interventions Intervention Not Indicated  Transportation Interventions Intervention Not Indicated      SDOH Screenings   Alcohol Screen: Not on file  Depression (PHQ2-9): Low Risk    PHQ-2 Score: 0  Financial Resource Strain: Low Risk    Difficulty of Paying Living Expenses: Not hard at all  Food Insecurity: No Food Insecurity   Worried About Charity fundraiser in the Last Year: Never true   [place] in the Last Year: Never true  Housing: Low Risk     Last Housing Risk Score: 0  Physical Activity: Insufficiently Active   Days of Exercise per Week: 5 days   Minutes of Exercise per Session: 20 min  Social Connections: Moderately Isolated   Frequency of Communication with Friends and Family: More than three times a week   Frequency of Social Gatherings with Friends and Family: More than three times a week   Attends Religious Services: More than 4 times per year   Active Member of [employer] or Organizations: No   Attends Archivist Meetings: Never   Marital Status: Widowed  Stress: No Stress Concern Present   Feeling of Stress : Not at all  Tobacco Use: Medium Risk   Smoking Tobacco Use: Former   Smokeless Tobacco Use: Never   Passive Exposure: Not on file  Transportation Needs: No Transportation Needs   Lack of Transportation (Medical): No   Lack of Transportation (Non-Medical): No   Patient reports her days are usually busy. She helps take care of her grandkids, mainly one that is 33 years old. She has a total of 10 grandkids and 10 great grandkids and only a couple of them live nearby. She mostly helps care for her youngest son's children.   Patient also reports that she is a busy body and loves working in her yard. She really enjoys spending time outside and is unable to do much of that right now. She also walks her daushaund who is 74 years old almost every day and her dog still has energy. She walks her dog for about 30 minutes.  Patient reports her biggest issue with her medications right now is that her cholesterol medication has taken her appetite and she noticed a change right when she started taking it. She inquired about switching medications. She also reports that her pharmacy gives her "too many" medications each time and this has made her stockpile them. She has not filled 2 of her blood pressure medications for a year because of this despite a consistent fill history prior to this.  CCM Care Plan  Allergies   Allergen Reactions   Acetaminophen Hives and Nausea Only   Azithromycin Hives and Nausea And Vomiting   Lipitor [Atorvastatin] Nausea Only    weakness   Zyrtec [Cetirizine]     Hives     Medications Reviewed Today     Reviewed by Viona Gilmore, [facility] (Pharmacist) on 04/21/21 at [place] List Status: <None>   Medication Order Taking? Sig Documenting Provider Last Dose Status Informant  amLODipine (NORVASC) 5 MG tablet [id number] Yes TAKE 1 TABLET(5 MG) BY MOUTH DAILY Lorretta Harp, MD Taking Active   Aromatic Inhalants (VICKS VAPOR INHALER IN) [id number]  Inhale 1 puff into the lungs as needed. [provider]  Active   aspirin 81 MG EC tablet [id number] Yes Take 81 mg by mouth daily. Swallow whole. [provider] Taking Active Self  Calcium Carb-Cholecalciferol (CALCIUM 1000 + D PO) [id number] Yes  [provider] Taking Active   famotidine (PEPCID) 20 MG tablet [id number] Yes Take 1 tablet (20 mg total) by mouth 3 times/day as needed-between meals & bedtime for heartburn or indigestion. Noralyn Pick, NP Taking Active   fexofenadine [facility] ALLERGY) 180 MG tablet [id number] Yes Take 1 tablet (180 mg total) by mouth daily. [name], MD Taking Active   hyoscyamine (LEVSIN SL) 0.125 MG SL tablet [id number] Yes Take 1 to 2 tablets under the tongue as needed for cramping and abdominal bloating. Milus Banister, MD Taking Active   mesalamine Doristine Johns) 1.2 g EC tablet [id number] Yes Take 4 tablets (4.8 g total) by mouth daily with breakfast. Milus Banister, MD Taking Active   Multiple Vitamins-Minerals (CENTRUM ADULTS PO) [id number] Yes Take 1 tablet by mouth daily.  [provider] Taking Active Self  olmesartan (BENICAR) 5 MG tablet [id number] Yes Take 10 mg by mouth daily. [provider] Taking Active   Omega-3 Fatty Acids (FISH OIL PO) [id number] Yes 2,000 mg daily. [provider] Taking Active   pantoprazole  (PROTONIX) 40 MG tablet [id number] Yes Take 1 tablet (40 mg total) by mouth daily. Milus Banister, MD Taking Active   potassium chloride SA (KLOR-CON) 20 MEQ tablet [id number] Yes Take 20 mEq by mouth daily. [provider] Taking Active   rosuvastatin (CRESTOR) 20 MG tablet [id number] Yes Take 1 tablet (20 mg total) by mouth daily. Lorretta Harp, MD Taking Active   triamcinolone cream (KENALOG) 0.5 % [id number] Yes APPLY EXTERNALLY TO THE AFFECTED AREA TWICE DAILY AS NEEDED FOR DRY SKIN [name], MD Taking Active   vitamin B-12 (CYANOCOBALAMIN) 1000 MCG tablet [id number] Yes Take 1,000 mcg by mouth daily. [provider] Taking Active             Patient Active Problem List   Diagnosis Date Noted   Headache above the eye region 02/19/2021   Nasal congestion 02/19/2021   Emphysema lung ([place]) 10/04/2020   Abdominal pain, epigastric 03/18/2020   Low back pain without sciatica 10/30/2019   Body mass index (BMI) 26.0-26.9, adult 10/23/2019   Nonischemic cardiomyopathy ([place]) 04/12/2018   Chest pain 01/25/2018   Left bundle branch block 01/25/2018   Carotid artery disease ([place]) 01/25/2018   Seasonal allergies 10/18/2017   Arthritis 10/18/2017   Pre-diabetes 10/18/2017   Protein-calorie malnutrition, severe 05/05/2016   Dysphagia 05/04/2016   HNP (herniated nucleus pulposus) with myelopathy, cervical 04/17/2016   Abdominal pain 02/01/2016   Acute diverticulitis 02/01/2016   HLD (hyperlipidemia) 02/01/2016  Diabetes mellitus without complication ([place]) 16/60/6301   Hypokalemia 02/01/2016   Hypertension    Cervical spondylosis without myelopathy 01/13/2016   Bilateral carpal tunnel syndrome 01/13/2016   Herniated lumbar intervertebral disc 06/30/2013    Class: Acute    Immunization History  Administered Date(s) Administered   PFIZER(Purple Top)SARS-COV-2 Vaccination 06/24/2019, 07/15/2019, 02/27/2020    Conditions to be addressed/monitored:   Hypertension, Hyperlipidemia, Diabetes, GERD, Osteoarthritis, and Allergic Rhinitis  Care Plan : [place]  Updates made by Viona Gilmore, [place] since 04/22/2021 12:00 AM     Problem: Problem: Hypertension, Hyperlipidemia, Diabetes, GERD, Osteoarthritis, and Allergic Rhinitis      Long-Range Goal: Patient-Specific Goal   Start Date: 04/21/2021  Expected End Date: 04/21/2022  This Visit's Progress: On track  Priority: High  Note:   Current Barriers:  Unable to independently monitor therapeutic efficacy Unable to achieve control of cholesterol   Pharmacist Clinical Goal(s):  Patient will achieve adherence to monitoring guidelines and medication adherence to achieve therapeutic efficacy achieve control of cholesterol as evidenced by next lipid panel  through collaboration with PharmD and provider.   Interventions: 1:1 collaboration with [name], MD regarding development and update of comprehensive plan of care as evidenced by provider attestation and co-signature Inter-disciplinary care team collaboration (see longitudinal plan of care) Comprehensive medication review performed; medication list updated in electronic medical record  Hypertension (BP goal <130/80) -Not ideally controlled -Current treatment: Olmesartan 5 mg 2 tablets once daily Amlodipine 5 mg 1 tablet daily -Medications previously tried: none  -Current home readings: never > 150, usually 130/60s; checking once a week different times of day with arm cuff (never brought it into the office) -Current dietary habits: seldom uses salt; uses other seasonings more often (mrs dash, paprika, pepper); some canned and some frozen vegetables -Current exercise habits: walking daily -Denies hypotensive/hypertensive symptoms -Educated on Importance of home blood pressure monitoring; Proper BP monitoring technique; Symptoms of hypotension and importance of maintaining adequate hydration; -Counseled to  monitor BP at home weekly, document, and provide log at future appointments -Counseled on diet and exercise extensively Recommended to continue current medication Recommended to not use expired blood pressure medications.  Hyperlipidemia: (LDL goal < 100) -Uncontrolled -Current treatment: Rosuvastatin 20 mg 1 tablet daily -Medications previously tried: none  -Current dietary patterns: doesn't eat out frequently; chicken and fish - uses a Higher education careers adviser; uses crisco and doesn't; does eat raw vegetables  -Current exercise habits: using weights while watching TV (2-3 times a week); walking dog for 30 minutes at one time -Educated on Cholesterol goals;  Benefits of statin for ASCVD risk reduction; Importance of limiting foods high in cholesterol; Exercise goal of 150 minutes per week; -Counseled on diet and exercise extensively Collaborated with cardiology to switch to alternative statin.  Carotid artery disease (Goal: prevent events) -Not ideally controlled -Current treatment  Aspirin 81 mg 1 tablet daily Rosuvastatin 20 mg 1 tablet daily -Medications previously tried: none  -Recommended to continue current medication   Diabetes (A1c goal <7%) -Controlled -Current medications: No medications -Medications previously tried: metformin  -Current home glucose readings fasting glucose: usually 110 or less; once to twice a week post prandial glucose: n/a -Denies hypoglycemic/hyperglycemic symptoms -Current meal patterns:  breakfast: bacon, eggs, oatmeal or grits or applesauce and toast; coffee; chicken biscuit and fritos lunch: ate 2 PB crackers  dinner: did not discuss snacks: not snacking much drinks: drinking water (6-7 bottles of water per day), gatorade, tea, sodas -Current exercise: walking  daily -Educated on A1c and blood sugar goals; Carbohydrate counting and/or plate method -Counseled to check feet daily and get yearly eye exams -Counseled on diet and exercise  extensively  GERD (Goal: minimize symptoms) -Not ideally controlled -Current treatment  Pantoprazole 40 mg 1 tablet daily Famotidine 20 mg 1 tablet as needed Gas X as needed -Medications previously tried: none  -Counseled on non-pharmacologic management of symptoms such as elevating the head of your bed, avoiding eating 2-3 hours before bed, avoiding triggering foods such as acidic, spicy, or fatty foods, eating smaller meals, and wearing clothes that are loose around the waist  Stomach pain (Goal: minimize symptoms) -Controlled -Current treatment  Hyoscyamine 0.125 mg 1 tablet as needed  Mesalamine 1.2 mg 4 tablets with breakfast -Medications previously tried: none  -Recommended to continue current medication   Health Maintenance -Vaccine gaps: Prevnar 20, tetanus, COVID booster -Current therapy:  Multivitamin 1 tablet daily - 1000 units of vitamin D Triamcinolone 0.5% cream as needed Potassium 20 mEq 1 tablet daily Calcium carbonate-vitamin D 600-400 units Vitamin B12 1000 mcg Fish oil 2000 mg 1 capsule daily Allegra 180 mg 1 tablet daily - not taking; ran out -Educated on Cost vs benefit of each product must be carefully weighed by individual consumer -Patient is satisfied with current therapy and denies issues -Counseled on indigestion side effects with fish oil and to trial without to see if she notices a difference.  Patient Goals/Self-Care Activities Patient will:  - take medications as prescribed as evidenced by patient report and record review check blood pressure weekly, document, and provide at future appointments target a minimum of 150 minutes of moderate intensity exercise weekly  Follow Up Plan: Telephone follow up appointment with care management team member scheduled for: 3 months       Medication Assistance: None required.  Patient affirms current coverage meets needs.  Compliance/Adherence/Medication fill history: Care Gaps: Pneumovax, Hep C  screening, COVID booster, tetanus, foot exam Last BP - 144/72 on 03/05/2021 Last A1C - 6.3 on 10/03/2020  Star-Rating Drugs: Olmesartan 5 mg - last filled 04/15/2020 at [facility] verified with pharmacist Crestor 20 mg - last [facility]/2021 at [facility]  Patient's preferred pharmacy is:  [facility] [place], [place] [address] Phone: [phone] Fax: [phone]  Uses pill box? Yes Pt endorses 90% compliance  We discussed: Benefits of medication synchronization, packaging and delivery as well as enhanced pharmacist oversight with [facility]. Patient decided to: Utilize [facility] pharmacy for medication synchronization, packaging and delivery  Care Plan and Follow Up Patient Decision:  Patient agrees to Care Plan and Follow-up.  Plan: Telephone follow up appointment with care management team member scheduled for:  3 months  Jeni Salles, PharmD, [place] Pharmacist [place] at [place] [phone]

## 2021-04-22 ENCOUNTER — Other Ambulatory Visit: Payer: Self-pay | Admitting: Family Medicine

## 2021-04-22 NOTE — Patient Instructions (Addendum)
Hi Angela Pugh,  It was great to get to meet you in person! Below is a summary of some of the topics we discussed.   Please reach out to me if you have any questions or need anything before our follow up! I scheduled you for a telephone touch base in 3 months, please let me know if that date or time won't work.  Best, Angela Pugh  Angela Pugh, PharmD, [facility]   Visit Information   Goals Addressed   None    Patient Care Plan: CCM Pharmacy Care Plan     Problem Identified: Problem: Hypertension, Hyperlipidemia, Diabetes, GERD, Osteoarthritis, and Allergic Rhinitis      Long-Range Goal: Patient-Specific Goal   Start Date: 04/21/2021  Expected End Date: 04/21/2022  This Visit's Progress: On track  Priority: High  Note:   Current Barriers:  Unable to independently monitor therapeutic efficacy Unable to achieve control of cholesterol   Pharmacist Clinical Goal(s):  Patient will achieve adherence to monitoring guidelines and medication adherence to achieve therapeutic efficacy achieve control of cholesterol as evidenced by next lipid panel  through collaboration with PharmD and provider.   Interventions: 1:1 collaboration with Angela Ruddy, MD regarding development and update of comprehensive plan of care as evidenced by provider attestation and co-signature Inter-disciplinary care team collaboration (see longitudinal plan of care) Comprehensive medication review performed; medication list updated in electronic medical record  Hypertension (BP goal <130/80) -Not ideally controlled -Current treatment: Olmesartan 5 mg 2 tablets once daily Amlodipine 5 mg 1 tablet daily -Medications previously tried: none  -Current home readings: never > 150, usually 130/60s; checking once a week different times of day with arm cuff (never brought it into the office) -Current dietary habits: seldom uses salt; uses other seasonings  more often (mrs dash, paprika, pepper); some canned and some frozen vegetables -Current exercise habits: walking daily -Denies hypotensive/hypertensive symptoms -Educated on Importance of home blood pressure monitoring; Proper BP monitoring technique; Symptoms of hypotension and importance of maintaining adequate hydration; -Counseled to monitor BP at home weekly, document, and provide log at future appointments -Counseled on diet and exercise extensively Recommended to continue current medication Recommended to not use expired blood pressure medications.  Hyperlipidemia: (LDL goal < 100) -Uncontrolled -Current treatment: Rosuvastatin 20 mg 1 tablet daily -Medications previously tried: none  -Current dietary patterns: doesn't eat out frequently; chicken and fish - uses a Higher education careers adviser; uses crisco and doesn't; does eat raw vegetables  -Current exercise habits: using weights while watching TV (2-3 times a week); walking dog for 30 minutes at one time -Educated on Cholesterol goals;  Benefits of statin for ASCVD risk reduction; Importance of limiting foods high in cholesterol; Exercise goal of 150 minutes per week; -Counseled on diet and exercise extensively Collaborated with cardiology to switch to alternative statin.  Carotid artery disease (Goal: prevent events) -Not ideally controlled -Current treatment  Aspirin 81 mg 1 tablet daily Rosuvastatin 20 mg 1 tablet daily -Medications previously tried: none  -Recommended to continue current medication   Diabetes (A1c goal <7%) -Controlled -Current medications: No medications -Medications previously tried: metformin  -Current home glucose readings fasting glucose: usually 110 or less; once to twice a week post prandial glucose: n/a -Denies hypoglycemic/hyperglycemic symptoms -Current meal patterns:  breakfast: bacon, eggs, oatmeal or grits or applesauce and toast; coffee; chicken biscuit and fritos lunch: ate 2 PB crackers   dinner: did not discuss snacks: not snacking much drinks: drinking water (6-7 bottles of  water per day), gatorade, tea, sodas -Current exercise: walking daily -Educated on A1c and blood sugar goals; Carbohydrate counting and/or plate method -Counseled to check feet daily and get yearly eye exams -Counseled on diet and exercise extensively  GERD (Goal: minimize symptoms) -Not ideally controlled -Current treatment  Pantoprazole 40 mg 1 tablet daily Famotidine 20 mg 1 tablet as needed Gas X as needed -Medications previously tried: none  -Counseled on non-pharmacologic management of symptoms such as elevating the head of your bed, avoiding eating 2-3 hours before bed, avoiding triggering foods such as acidic, spicy, or fatty foods, eating smaller meals, and wearing clothes that are loose around the waist  Stomach pain (Goal: minimize symptoms) -Controlled -Current treatment  Hyoscyamine 0.125 mg 1 tablet as needed  Mesalamine 1.2 mg 4 tablets with breakfast -Medications previously tried: none  -Recommended to continue current medication   Health Maintenance -Vaccine gaps: Prevnar 20, tetanus, COVID booster -Current therapy:  Multivitamin 1 tablet daily - 1000 units of vitamin D Triamcinolone 0.5% cream as needed Potassium 20 mEq 1 tablet daily Calcium carbonate-vitamin D 600-400 units Vitamin B12 1000 mcg Fish oil 2000 mg 1 capsule daily Allegra 180 mg 1 tablet daily - not taking; ran out -Educated on Cost vs benefit of each product must be carefully weighed by individual consumer -Patient is satisfied with current therapy and denies issues -Counseled on indigestion side effects with fish oil and to trial without to see if she notices a difference.  Patient Goals/Self-Care Activities Patient will:  - take medications as prescribed as evidenced by patient report and record review check blood pressure weekly, document, and provide at future appointments target a minimum of  150 minutes of moderate intensity exercise weekly  Follow Up Plan: Telephone follow up appointment with care management team member scheduled for: 3 months      Angela Pugh was given information about Chronic Care Management services today including:  CCM service includes personalized support from designated clinical staff supervised by her physician, including individualized plan of care and coordination with other care providers 24/7 contact phone numbers for assistance for urgent and routine care needs. Standard insurance, coinsurance, copays and deductibles apply for chronic care management only during months in which we provide at least 20 minutes of these services. Most insurances cover these services at 100%, however patients may be responsible for any copay, coinsurance and/or deductible if applicable. This service may help you avoid the need for more expensive face-to-face services. Only one practitioner may furnish and bill the service in a calendar month. The patient may stop CCM services at any time (effective at the end of the month) by phone call to the office staff.  Patient agreed to services and verbal consent obtained.   The patient verbalized understanding of instructions, educational materials, and care plan provided today and agreed to receive a mailed copy of patient instructions, educational materials, and care plan.  Telephone follow up appointment with pharmacy team member scheduled for: 3 months  Angela Pugh, [facility]

## 2021-04-24 ENCOUNTER — Telehealth: Payer: Self-pay | Admitting: Gastroenterology

## 2021-04-24 ENCOUNTER — Other Ambulatory Visit: Payer: Self-pay

## 2021-04-24 ENCOUNTER — Telehealth: Payer: Self-pay | Admitting: Cardiovascular Disease

## 2021-04-24 ENCOUNTER — Telehealth: Payer: Self-pay | Admitting: Pharmacist

## 2021-04-24 DIAGNOSIS — R1013 Epigastric pain: Secondary | ICD-10-CM

## 2021-04-24 MED ORDER — PANTOPRAZOLE SODIUM 40 MG PO TBEC: 40.0000 mg | DELAYED_RELEASE_TABLET | Freq: Every day | ORAL | 3 refills | Status: DC

## 2021-04-24 MED ORDER — OLMESARTAN MEDOXOMIL 5 MG PO TABS
10.0000 mg | ORAL_TABLET | Freq: Every day | ORAL | 5 refills | Status: DC
Start: 2021-04-24 — End: 2021-09-12

## 2021-04-24 MED ORDER — ROSUVASTATIN CALCIUM 20 MG PO TABS: 20.0000 mg | ORAL_TABLET | Freq: Every day | ORAL | 3 refills | Status: DC

## 2021-04-24 MED ORDER — AMLODIPINE BESYLATE 5 MG PO TABS: ORAL_TABLET | ORAL | 3 refills | Status: DC

## 2021-04-24 MED ORDER — HYOSCYAMINE SULFATE 0.125 MG SL SUBL: SUBLINGUAL_TABLET | SUBLINGUAL | 3 refills | Status: DC

## 2021-04-24 MED ORDER — MESALAMINE 1.2 G PO TBEC: 4.8000 g | DELAYED_RELEASE_TABLET | Freq: Every day | ORAL | 3 refills | Status: DC

## 2021-04-24 NOTE — Chronic Care Management (AMB) (Addendum)
Chronic Care Management Pharmacy Assistant   Name: Diamon Reddinger  MRN: [id number] DOB: 1946/03/14    Referred by: [name], MD   Reason for referral: Chronic care management  Reviewed chart for medication changes ahead of medication coordination call.  BP Readings from Last 3 Encounters:  04/21/21 (!) 142/70  03/05/21 134/76  01/28/21 128/68    Lab Results  Component Value Date   HGBA1C 6.3 10/03/2020     Verbal consent obtained for [facility] enhanced pharmacy services (medication synchronization, adherence packaging, delivery coordination). A medication sync plan was created to allow patient to get all medications delivered once every 30 to 90 days per patient preference. Patient understands they have freedom to choose pharmacy and clinical pharmacist will coordinate care between all prescribers and [facility].  Patient requested to obtain medications through Adherence Packaging  30 Days   Med Sync Plan: Hold off on onboarding per patient at this time  Patient will need a short fill of (med), prior to adherence delivery. No  Prescriptions requested from PCP and specialists. Dr Quay Burow - [facility] to send new prescriptions to [facility] to [facility] new prescriptions to [facility]  Reason for Encounter: [facility] Onboarding  Recent office visits:  None  Recent consult visits:  None  Hospital visits:  None  Medications: Outpatient Encounter Medications as of 04/24/2021  Medication Sig   amLODipine (NORVASC) 5 MG tablet TAKE 1 TABLET(5 MG) BY MOUTH DAILY   Aromatic Inhalants (VICKS VAPOR INHALER IN) Inhale 1 puff into the lungs as needed.   aspirin 81 MG EC tablet Take 81 mg by mouth daily. Swallow whole.   Calcium Carb-Cholecalciferol (CALCIUM 1000 + D PO)    famotidine (PEPCID) 20 MG tablet Take 1 tablet (20 mg total) by mouth 3 times/day as needed-between meals & bedtime for heartburn or indigestion.    fexofenadine (ALLEGRA ALLERGY) 180 MG tablet Take 1 tablet (180 mg total) by mouth daily.   hyoscyamine (LEVSIN SL) 0.125 MG SL tablet Take 1 to 2 tablets under the tongue as needed for cramping and abdominal bloating.   mesalamine (LIALDA) 1.2 g EC tablet Take 4 tablets (4.8 g total) by mouth daily with breakfast.   Multiple Vitamins-Minerals (CENTRUM ADULTS PO) Take 1 tablet by mouth daily.    olmesartan (BENICAR) 5 MG tablet Take 10 mg by mouth daily.   Omega-3 Fatty Acids (FISH OIL PO) 2,000 mg daily.   pantoprazole (PROTONIX) 40 MG tablet Take 1 tablet (40 mg total) by mouth daily.   potassium chloride SA (KLOR-CON) 20 MEQ tablet Take 20 mEq by mouth daily.   rosuvastatin (CRESTOR) 20 MG tablet Take 1 tablet (20 mg total) by mouth daily.   triamcinolone cream (KENALOG) 0.5 % APPLY EXTERNALLY TO THE AFFECTED AREA TWICE DAILY AS NEEDED FOR DRY SKIN   vitamin B-12 (CYANOCOBALAMIN) 1000 MCG tablet Take 1,000 mcg by mouth daily.   No facility-administered encounter medications on file as of 04/24/2021.  Fill History: ROSUVASTATIN 20MG  TABLETS 02/03/2021 90   PANTOPRAZOLE 40MG  TABLETS 10/08/2020 90   MESALAMINE 1.2GM TABLETS 03/18/2021 90   AMLODIPINE BESYLATE 5MG  TABLETS 04/21/2021 15   OLMESARTAN MEDOXOMIL 5MG  TABLETS 04/15/2020 90   POTASSIUM CL 20MEQ ER TABLETS 01/12/2021 90   ALLERGY RELIEF(FEXOFENADINE)180MG  T 04/20/2021 30   HYOSCYAMINE 0.125MG  SUBLINGUAL TABS 11/05/2020 25    Care Gaps: AWV - scheduled for 02/10/2022 Last BP - 144/72 on 03/05/2021 Last A1C - 6.3 on 10/03/2020 Pneumovax -  never done Hep C screen - never done TDAP - never done Covid vaccine - overdue Foot exam - over due HGA1C - overdue  Star Rating Drugs: Olmesartan 5 mg - last filled 04/15/2020 at [facility], verified with pharmacist.  Crestor 20 mg - last filled 02/03/2021 at [facility] [phone]

## 2021-04-24 NOTE — Telephone Encounter (Signed)
Rx sent to [facility] as requested.

## 2021-04-24 NOTE — Telephone Encounter (Signed)
Inbound call from pharmacy, states that patient has switched to them.  Requested that the following medications be sent over to them:  Hyoscymine  Pantoprazole  Mesalamine    Please advise.

## 2021-04-24 NOTE — Telephone Encounter (Signed)
*  STAT* If patient is at the pharmacy, call can be transferred to refill team.   1. Which medications need to be refilled? (please list name of each medication and dose if known)   rosuvastatin (CRESTOR) 20 MG tablet Take 1 tablet (20 mg total) by mouth daily  amLODipine (NORVASC) 5 MG tablet TAKE 1 TABLET(5 MG) BY MOUTH DAILY  olmesartan (BENICAR) 5 MG tablet Take 10 mg by mouth daily.    2. Which pharmacy/location (including street and city if local pharmacy) is medication to be sent to?  [facility] - [place], [place] - [place] [address]   3. Do they need a 30 day or 90 day supply? 30 ds

## 2021-05-11 ENCOUNTER — Encounter (HOSPITAL_COMMUNITY): Payer: Self-pay | Admitting: Emergency Medicine

## 2021-05-11 ENCOUNTER — Emergency Department (HOSPITAL_COMMUNITY): Payer: Medicare Other

## 2021-05-11 ENCOUNTER — Other Ambulatory Visit: Payer: Self-pay

## 2021-05-11 ENCOUNTER — Emergency Department (HOSPITAL_COMMUNITY)
Admission: EM | Admit: 2021-05-11 | Discharge: 2021-05-11 | Disposition: A | Discharge status: Home / Self Care | Payer: Medicare Other | Attending: Emergency Medicine | Admitting: Emergency Medicine

## 2021-05-11 DIAGNOSIS — Z20822 Contact with and (suspected) exposure to covid-19: Secondary | ICD-10-CM | POA: Diagnosis not present

## 2021-05-11 DIAGNOSIS — I10 Essential (primary) hypertension: Secondary | ICD-10-CM | POA: Insufficient documentation

## 2021-05-11 DIAGNOSIS — J069 Acute upper respiratory infection, unspecified: Secondary | ICD-10-CM

## 2021-05-11 DIAGNOSIS — Z7982 Long term (current) use of aspirin: Secondary | ICD-10-CM | POA: Diagnosis not present

## 2021-05-11 DIAGNOSIS — M5432 Sciatica, left side: Secondary | ICD-10-CM | POA: Diagnosis not present

## 2021-05-11 DIAGNOSIS — E1142 Type 2 diabetes mellitus with diabetic polyneuropathy: Secondary | ICD-10-CM | POA: Diagnosis not present

## 2021-05-11 DIAGNOSIS — Z79899 Other long term (current) drug therapy: Secondary | ICD-10-CM | POA: Diagnosis not present

## 2021-05-11 DIAGNOSIS — M549 Dorsalgia, unspecified: Secondary | ICD-10-CM | POA: Diagnosis present

## 2021-05-11 DIAGNOSIS — Z87891 Personal history of nicotine dependence: Secondary | ICD-10-CM | POA: Diagnosis not present

## 2021-05-11 DIAGNOSIS — M5442 Lumbago with sciatica, left side: Secondary | ICD-10-CM

## 2021-05-11 LAB — RESP PANEL BY RT-PCR (FLU A&B, COVID) ARPGX2
Influenza A by PCR: NEGATIVE
Influenza B by PCR: NEGATIVE
SARS Coronavirus 2 by RT PCR: NEGATIVE

## 2021-05-11 MED ORDER — METHOCARBAMOL 500 MG PO TABS: 500.0000 mg | ORAL_TABLET | Freq: Two times a day (BID) | ORAL | 0 refills | Status: DC

## 2021-05-11 MED ORDER — LIDOCAINE 5 % EX PTCH: 1.0000 | MEDICATED_PATCH | CUTANEOUS | 0 refills | Status: DC

## 2021-05-11 MED ORDER — KETOROLAC TROMETHAMINE 15 MG/ML IJ SOLN
15.0000 mg | Freq: Once | INTRAMUSCULAR | Status: AC
  Administered 2021-05-11: 13:00:00 15 mg via INTRAMUSCULAR
  Filled 2021-05-11: qty 1

## 2021-05-11 MED ORDER — DEXAMETHASONE SODIUM PHOSPHATE 10 MG/ML IJ SOLN
10.0000 mg | Freq: Once | INTRAMUSCULAR | Status: AC
  Administered 2021-05-11: 14:00:00 10 mg via INTRAMUSCULAR
  Filled 2021-05-11: qty 1

## 2021-05-11 NOTE — Discharge Instructions (Addendum)
Please use ibuprofen for pain.  You may use 600-800 mg ibuprofen every 6 hours. Not to exceed 3200 mg ibuprofen 24 hours.  In addition you can apply the lidocaine patches directly where it hurts.  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  Call and follow-up with your orthopedic doctor especially if you continue to have pain despite treatment as above.  It was a pleasure taking care of you today, I hope that you have a Merry Christmas!

## 2021-05-11 NOTE — ED Provider Notes (Signed)
Emergency Medicine Provider Triage Evaluation Note  Angela Pugh , a 75 y.o. female  was evaluated in triage.  Pt complains of severe low back pain worsening since Friday. Endorses lack of strength in left leg > right. Reports some shooting pain down back of left leg behind knee. Has been taking motrin with minimal relief. Has not taken anything at this time today. Also endorses some cough without shortness of breath / chest pain, nausea, vomiting. Negative home covid test x 2.  Review of Systems  Positive: Back pain, cough Negative: Numbness, nausea, vomiting, Shob, chest pain  Physical Exam  BP (!) 160/90 (BP Location: Left Arm)    Pulse 60    Temp 98 F (36.7 C) (Oral)    Resp 18    SpO2 96%  Gen:   Awake, no distress   Resp:  Normal effort  MSK:   Decreased strenght bilateral flexion / extension of hips -- likely some secondary to effort as I watched patient ambulate with some difficulty / pain Other:  Intact dp /pt pulse bil  Medical Decision Making  Medically screening exam initiated at 12:28 PM.  Appropriate orders placed.  Angela Pugh was informed that the remainder of the evaluation will be completed by another provider, this initial triage assessment does not replace that evaluation, and the importance of remaining in the ED until their evaluation is complete.  Back pain/cough   Dorien Chihuahua 05/11/21 1231    Lacretia Leigh, MD 05/12/21 [phone]

## 2021-05-11 NOTE — ED Triage Notes (Signed)
Reports lower back pain x3 days, thinks she pulled a muscle. Went to sit down and had stabbing back pain. Also cough. States it has spread across her lower back, hurts so much she has to lift her L leg up to move it.

## 2021-05-11 NOTE — ED Provider Notes (Signed)
[facility] DEPT Provider Note   CSN: [id number] Arrival date & time: 05/11/21  1045     History No chief complaint on file.   Chessie Neuharth is a 75 y.o. female with past medical history significant for chronic back pain, previous lumbar back surgery, known spondylolisthesis who presents with acute on chronic back pain for the last 3 days.  Patient reports that back pain started on the right side, migrated over to the left side, has radiation down the left leg.  Patient denies any numbness, tingling, difficulty with defecation, or urination.  Patient denies any groin tingling.  Patient denies history of cancer, chronic corticosteroid use, recent fever, or IV drug use.  Patient rates the pain is 8/10 at this time, sharp in nature.  Patient has decreased strength bilateral leg second of the pain, but has been able to ambulate with small limp with some difficulty.  Patient reports that she has taken Motrin 400 mg with some relief, but has not taken anything today.  Additionally patient reports that she has had a mild cough, sore throat for the last 2 days. Patient denies any recent sick contacts.  Denies chest pain, denies shortness of breath.  Patient endorses no wheezing, or difficulty breathing.  No history of asthma, COPD.  Patient denies fever, chills.  Patient denies nausea, vomiting, diarrhea.  HPI     Past Medical History:  Diagnosis Date   Arthritis    back, knees, shoulder, hands , injections in pelvis, for bone spurs (05/04/2016)   Chronic back pain    Diverticulosis    History of kidney stones    HLD (hyperlipidemia)    Hypertension    Migraine    hx (05/04/2016)   Peripheral neuropathy    Radiculopathy    Sinus complaint    Sinus headache    occasional (05/04/2016)   Type II diabetes mellitus ([place])    diet controlled, no meds now, was on insulin & po treatment at one time     Vertigo     Patient Active Problem List   Diagnosis  Date Noted   Headache above the eye region 02/19/2021   Nasal congestion 02/19/2021   Emphysema lung ([place]) 10/04/2020   Abdominal pain, epigastric 03/18/2020   Low back pain without sciatica 10/30/2019   Body mass index (BMI) 26.0-26.9, adult 10/23/2019   Nonischemic cardiomyopathy ([place]) 04/12/2018   Chest pain 01/25/2018   Left bundle branch block 01/25/2018   Carotid artery disease ([place]) 01/25/2018   Seasonal allergies 10/18/2017   Arthritis 10/18/2017   Pre-diabetes 10/18/2017   Protein-calorie malnutrition, severe 05/05/2016   Dysphagia 05/04/2016   HNP (herniated nucleus pulposus) with myelopathy, cervical 04/17/2016   Abdominal pain 02/01/2016   Acute diverticulitis 02/01/2016   HLD (hyperlipidemia) 02/01/2016   Diabetes mellitus without complication ([place]) 32/20/2542   Hypokalemia 02/01/2016   Hypertension    Cervical spondylosis without myelopathy 01/13/2016   Bilateral carpal tunnel syndrome 01/13/2016   Herniated lumbar intervertebral disc 06/30/2013    Class: Acute    Past Surgical History:  Procedure Laterality Date   ANTERIOR CERVICAL DECOMP/DISCECTOMY FUSION N/A 04/17/2016   Procedure: ANTERIOR CERVICAL DECOMPRESSION/DISCECTOMY FUSION CERVICAL THREE - CERVICAL FOUR, POSTERIOR CERVICAL DISCECTOMY CERVICAL SEVEN -THORASIC ONE LEFT;  Surgeon: Ashok Pall, MD;  Location: [facility];  Service: Neurosurgery;  Laterality: N/A;  Anterior/Posterior   ANTERIOR FUSION CERVICAL SPINE  2000   Dr. Carloyn Manner; "for bone spurs; put hardware in too"   BACK SURGERY  COLONOSCOPY  06/10/2016   [facility]   LEFT HEART CATH AND CORONARY ANGIOGRAPHY N/A 03/17/2018   Procedure: LEFT HEART CATH AND CORONARY ANGIOGRAPHY;  Surgeon: Lorretta Harp, MD;  Location: [facility];  Service: Cardiovascular;  Laterality: N/A;   LUMBAR LAMINECTOMY Left 06/30/2013   Procedure: Left L3-4 Extraforaminal approach to excise far lateral herniated nucleus pulposus;  Surgeon: Jessy Oto, MD;  Location: [facility];  Service: Orthopedics;  Laterality: Left;   TUBAL LIGATION     VAGINAL HYSTERECTOMY       OB History   No obstetric history on file.     Family History  Problem Relation Age of Onset   Uterine cancer Mother    Diabetes type II Sister    Diabetes Mellitus II Brother    Colon cancer Neg Hx    Pancreatic cancer Neg Hx    Esophageal cancer Neg Hx    Colon polyps Neg Hx    Rectal cancer Neg Hx    Stomach cancer Neg Hx     Social History   Tobacco Use   Smoking status: Former    Packs/day: 0.50    Years: 20.00    Pack years: 10.00    Types: Cigarettes    Quit date: 06/30/1982    Years since quitting: 38.8   Smokeless tobacco: Never  Vaping Use   Vaping Use: Never used  Substance Use Topics   Alcohol use: Not Currently    Comment: 05/04/2016 "might have a drink a couple days/year; might not"   Drug use: No    Home Medications Prior to Admission medications   Medication Sig Start Date End Date Taking? Authorizing Provider  lidocaine (LIDODERM) 5 % Place 1 patch onto the skin daily. Remove & Discard patch within 12 hours or as directed by MD 05/11/21  Yes Ady Heimann H, PA-C  methocarbamol (ROBAXIN) 500 MG tablet Take 1 tablet (500 mg total) by mouth 2 (two) times daily. 05/11/21  Yes [place] Mielke H, PA-C  amLODipine (NORVASC) 5 MG tablet TAKE 1 TABLET(5 MG) BY MOUTH DAILY 04/24/21   Lorretta Harp, MD  Aromatic Inhalants (VICKS VAPOR INHALER IN) Inhale 1 puff into the lungs as needed.    [provider]  aspirin 81 MG EC tablet Take 81 mg by mouth daily. Swallow whole.    [provider]  Calcium Carb-Cholecalciferol (CALCIUM 1000 + D PO)     [provider]  famotidine (PEPCID) 20 MG tablet Take 1 tablet (20 mg total) by mouth 3 times/day as needed-between meals & bedtime for heartburn or indigestion. 03/18/20   Noralyn Pick, NP  fexofenadine (ALLEGRA ALLERGY) 180 MG tablet Take 1 tablet (180 mg total) by mouth  daily. 10/03/20   [name], MD  hyoscyamine (LEVSIN SL) 0.125 MG SL tablet Take 1 to 2 tablets under the tongue as needed for cramping and abdominal bloating. 04/24/21   Milus Banister, MD  mesalamine (LIALDA) 1.2 g EC tablet Take 4 tablets (4.8 g total) by mouth daily with breakfast. 04/24/21   Milus Banister, MD  Multiple Vitamins-Minerals (CENTRUM ADULTS PO) Take 1 tablet by mouth daily.     [provider]  olmesartan (BENICAR) 5 MG tablet Take 2 tablets (10 mg total) by mouth daily. 04/24/21   Lorretta Harp, MD  Omega-3 Fatty Acids (FISH OIL PO) 2,000 mg daily.    [provider]  pantoprazole (PROTONIX) 40 MG tablet Take 1 tablet (40  mg total) by mouth daily. 04/24/21   Milus Banister, MD  potassium chloride SA (KLOR-CON) 20 MEQ tablet Take 20 mEq by mouth daily. 10/09/20   [provider]  rosuvastatin (CRESTOR) 20 MG tablet Take 1 tablet (20 mg total) by mouth daily. 04/24/21 07/23/21  Lorretta Harp, MD  triamcinolone cream (KENALOG) 0.5 % APPLY EXTERNALLY TO THE AFFECTED AREA TWICE DAILY AS NEEDED FOR DRY SKIN 01/09/21   [name], MD  vitamin B-12 (CYANOCOBALAMIN) 1000 MCG tablet Take 1,000 mcg by mouth daily.    [provider]    Allergies    Acetaminophen, Azithromycin, Lipitor [atorvastatin], and Zyrtec [cetirizine]  Review of Systems   Review of Systems  Musculoskeletal:  Positive for back pain and gait problem.  All other systems reviewed and are negative.  Physical Exam Updated Vital Signs BP (!) 160/90 (BP Location: Left Arm)    Pulse 60    Temp 98 F (36.7 C) (Oral)    Resp 18    SpO2 96%   Physical Exam Vitals and nursing note reviewed.  Constitutional:      General: She is not in acute distress.    Appearance: Normal appearance.  HENT:     Head: Normocephalic and atraumatic.     Mouth/Throat:     Mouth: Mucous membranes are moist.     Pharynx: Oropharynx is clear. No oropharyngeal exudate.  Eyes:      General:        Right eye: No discharge.        Left eye: No discharge.  Cardiovascular:     Rate and Rhythm: Normal rate and regular rhythm.     Pulses: Normal pulses.     Comments: Intact DP, PT pulses bilaterally. Pulmonary:     Effort: Pulmonary effort is normal. No respiratory distress.  Musculoskeletal:        General: No deformity.     Cervical back: Neck supple.     Comments: No tenderness to palpation cervical spine, thoracic spine, there is some midline tenderness to palpation at the lumbar spine, however tenderness palpation is most focal in the lumbar paraspinous muscles bilaterally.  Patient does have positive straight leg raise on the left side.  Patient has intact strength 4/5 bilateral lower extremities to flexion extension of the hip, intact strength 5 out of 5 for flexion extension of the knee, 5 out of 5 plantar, dorsiflexion bilaterally.  Patient has intact gait with some difficulty, small limp.  Lymphadenopathy:     Cervical: No cervical adenopathy.  Skin:    General: Skin is warm and dry.     Capillary Refill: Capillary refill takes less than 2 seconds.  Neurological:     Mental Status: She is alert and oriented to person, place, and time.  Psychiatric:        Mood and Affect: Mood normal.        Behavior: Behavior normal.    ED Results / Procedures / Treatments   Labs (all labs ordered are listed, but only abnormal results are displayed) Labs Reviewed  RESP PANEL BY RT-PCR (FLU A&B, COVID) [id number]    EKG None  Radiology DG Lumbar Spine Complete  Result Date: 05/11/2021 CLINICAL DATA:  Back pain EXAM: LUMBAR SPINE - COMPLETE 4+ VIEW COMPARISON:  06/28/2019, 09/13/2019 FINDINGS: There is no evidence of lumbar spine fracture. Persistent grade 1 anterolisthesis of L4 on L5 secondary to degenerative facet arthropathy. Intervertebral disc height loss is most pronounced at L5-S1. Moderate  lower lumbar facet arthropathy. Scattered atherosclerotic calcifications of  the abdominal aorta. IMPRESSION: Mild to moderate lower lumbar spondylosis with grade 1 anterolisthesis of L4 on L5. Electronically Signed   By: Davina Poke D.O.   On: 05/11/2021 13:00    Procedures Procedures   Medications Ordered in ED Medications  ketorolac (TORADOL) 15 MG/ML injection 15 mg (15 mg Intramuscular Given 05/11/21 1259)  dexamethasone (DECADRON) injection 10 mg (10 mg Intramuscular Given 05/11/21 1416)    ED Course  I have reviewed the triage vital signs and the nursing notes.  Pertinent labs & imaging results that were available during my care of the patient were reviewed by me and considered in my medical decision making (see chart for details).    MDM Rules/Calculators/A&P                         Back pain: Overall well-appearing patient with history of chronic back pain and presents with acute back pain without recent injury.  Patient has no red flag symptoms for back pain including history of cancer, chronic corticosteroid use, IV drug use, recent fever.  Patient denies any numbness or tingling, but does have some shooting pain consistent with sciatica.  Patient has some midline tenderness in the lumbar spines we will obtain radiographic imaging to make sure that she has not had worsening of her listhesis.  Patient does have intact strength, gait with some difficulty.  Improved strength, gait after Toradol shot x1.  Radiographic imaging does redemonstrate spondylolisthesis, without acute worsening.  No new compression fracture noted.  Encouraged scheduled ibuprofen use, patient reports that she cannot use Tylenol and she is allergic.  Additionally we will prescribe a muscle relaxant, and lidocaine patches, administer intramuscular Decadron injections today.  Encourage follow-up with orthopedics.  Sore throat:  Patient has stable vital signs other than a mild hypertension with systolic of 725.  Patient does have a known history of hypertension.  Patient with clear  posterior oropharynx, no cervical lymphadenopathy, clear breath sounds bilaterally, no chest pain, no shortness of breath.  We will perform respiratory virus panel.  Respiratory virus panel is negative for COVID, flu.  Patient has signs and symptoms consistent with mild upper respiratory infection of viral origin.  Encourage patient to continue using symptomatic care at home, and to return for further evaluation if she begins experiencing worsening symptoms including chest pain, shortness of breath.  Patient discharged in stable condition at this time, return precautions given.  Final Clinical Impression(s) / ED Diagnoses Final diagnoses:  Acute bilateral low back pain with left-sided sciatica    Rx / DC Orders ED Discharge Orders          Ordered    methocarbamol (ROBAXIN) 500 MG tablet  2 times daily        05/11/21 1353    lidocaine (LIDODERM) 5 %  Every 24 hours        05/11/21 1353             Malcolm Quast, [place] H, PA-C 05/11/21 1419    Godfrey Pick, MD 05/11/21 2324

## 2021-05-14 ENCOUNTER — Encounter: Payer: Self-pay | Admitting: Specialist

## 2021-05-14 ENCOUNTER — Other Ambulatory Visit: Payer: Self-pay

## 2021-05-14 ENCOUNTER — Ambulatory Visit (INDEPENDENT_AMBULATORY_CARE_PROVIDER_SITE_OTHER): Payer: Medicare Other | Admitting: Specialist

## 2021-05-14 VITALS — BP 120/75 | HR 64 | Ht 66.0 in | Wt 167.0 lb

## 2021-05-14 DIAGNOSIS — M5417 Radiculopathy, lumbosacral region: Secondary | ICD-10-CM | POA: Diagnosis not present

## 2021-05-14 DIAGNOSIS — M7062 Trochanteric bursitis, left hip: Secondary | ICD-10-CM | POA: Diagnosis not present

## 2021-05-14 DIAGNOSIS — M4316 Spondylolisthesis, lumbar region: Secondary | ICD-10-CM

## 2021-05-14 DIAGNOSIS — M47812 Spondylosis without myelopathy or radiculopathy, cervical region: Secondary | ICD-10-CM

## 2021-05-14 DIAGNOSIS — M4807 Spinal stenosis, lumbosacral region: Secondary | ICD-10-CM | POA: Diagnosis not present

## 2021-05-14 MED ORDER — DIAZEPAM 5 MG PO TABS: ORAL_TABLET | ORAL | 0 refills | Status: DC

## 2021-05-14 MED ORDER — BUPIVACAINE HCL 0.5 % IJ SOLN
5.0000 mL | INTRAMUSCULAR | Status: AC | PRN
  Administered 2021-05-14: 10:00:00 5 mL via INTRA_ARTICULAR

## 2021-05-14 MED ORDER — METHYLPREDNISOLONE ACETATE 40 MG/ML IJ SUSP
40.0000 mg | INTRAMUSCULAR | Status: AC | PRN
  Administered 2021-05-14: 10:00:00 40 mg via INTRA_ARTICULAR

## 2021-05-14 MED ORDER — TRAMADOL HCL 50 MG PO TABS: 50.0000 mg | ORAL_TABLET | Freq: Four times a day (QID) | ORAL | 0 refills | Status: DC | PRN

## 2021-05-14 NOTE — Progress Notes (Signed)
Office Visit Note   Patient: Angela Pugh           Date of Birth: 28-Oct-1945           MRN: [id number] Visit Date: 05/14/2021              Requested by: [name], MD [place],  [place] [address] PCP: [name], MD   Assessment & Plan: Left hip pain, greater trochanteric bursitis, left L5 radicular pain likely due to left L5 or L4 nerve compression at the L4-5 spondylolisthesis or related to spondylosis at L5-S1 or both Visit Diagnoses: No diagnosis found. Pain with lying on the left side. Numbness and tingling into the left lateral thigh and calf and into the left great toe. Tender over the left greater trochanter Previous injection in 03/2020 left greater trochanter with good relief. Xray from ER 12/252022 shows grade 1-2 spondylolisthesis L4-5, DDD with disc narrowing and narrowing of the L5 neuroforamen on lateral radiograph. Previous CT scan with moderately severe spinal stenosis L4-5 and left L5  and L4 foramenal stenosis, spondylolisthesis L4-5.  Avoid bending, stooping and avoid lifting weights greater than 10 lbs. Avoid prolong standing and walking. Avoid frequent bending and stooping  No lifting greater than 10 lbs. May use ice or moist heat for pain. Weight loss is of benefit. Handicap license is approved. MRI of the lumbar spine ordered Orders:  No orders of the defined types were placed in this encounter.  No orders of the defined types were placed in this encounter.     Procedures: Large Joint Inj: L greater trochanter on 05/14/2021 10:19 AM Indications: pain Details: 22 G 3.5 in needle, superolateral approach  Arthrogram: No  Medications: 40 mg methylPREDNISolone acetate 40 MG/ML; 5 mL bupivacaine 0.5 % Outcome: tolerated well, no immediate complications  Bandaid applied Procedure, treatment alternatives, risks and benefits explained, specific risks discussed. Consent was given by the patient. Immediately prior to  procedure a time out was called to verify the correct patient, procedure, equipment, support staff and site/side marked as required. Patient was prepped and draped in the usual sterile fashion.     Clinical Data: No additional findings.   Subjective: Chief Complaint  Patient presents with   Left Hip - Pain    Pain over Greater troch area of left hip, tender to touch and wakes her up at night if she rolls on to that side    HPI  Review of Systems   Objective: Vital Signs: BP 120/75 (BP Location: Left Arm, Patient Position: Sitting)    Pulse 64    Ht 5\' 6"  (1.676 m)    Wt 167 lb (75.8 kg)    BMI 26.95 kg/m   Physical Exam  Ortho Exam  Specialty Comments:  No specialty comments available.  Imaging: No results found.   PMFS History: Patient Active Problem List   Diagnosis Date Noted   Herniated lumbar intervertebral disc 06/30/2013    Priority: High    Class: Acute   Headache above the eye region 02/19/2021   Nasal congestion 02/19/2021   Emphysema lung ([place]) 10/04/2020   Abdominal pain, epigastric 03/18/2020   Low back pain without sciatica 10/30/2019   Body mass index (BMI) 26.0-26.9, adult 10/23/2019   Nonischemic cardiomyopathy ([place]) 04/12/2018   Chest pain 01/25/2018   Left bundle branch block 01/25/2018   Carotid artery disease ([place]) 01/25/2018   Seasonal allergies 10/18/2017   Arthritis 10/18/2017   Pre-diabetes 10/18/2017  Protein-calorie malnutrition, severe 05/05/2016   Dysphagia 05/04/2016   HNP (herniated nucleus pulposus) with myelopathy, cervical 04/17/2016   Abdominal pain 02/01/2016   Acute diverticulitis 02/01/2016   HLD (hyperlipidemia) 02/01/2016   Diabetes mellitus without complication ([place]) 16/96/7893   Hypokalemia 02/01/2016   Hypertension    Cervical spondylosis without myelopathy 01/13/2016   Bilateral carpal tunnel syndrome 01/13/2016   Past Medical History:  Diagnosis Date   Arthritis    back,  knees, shoulder, hands , injections in pelvis, for bone spurs (05/04/2016)   Chronic back pain    Diverticulosis    History of kidney stones    HLD (hyperlipidemia)    Hypertension    Migraine    hx (05/04/2016)   Peripheral neuropathy    Radiculopathy    Sinus complaint    Sinus headache    occasional (05/04/2016)   Type II diabetes mellitus ([facility])    diet controlled, no meds now, was on insulin & po treatment at one time     Vertigo     Family History  Problem Relation Age of Onset   Uterine cancer Mother    Diabetes type II Sister    Diabetes Mellitus II Brother    Colon cancer Neg Hx    Pancreatic cancer Neg Hx    Esophageal cancer Neg Hx    Colon polyps Neg Hx    Rectal cancer Neg Hx    Stomach cancer Neg Hx     Past Surgical History:  Procedure Laterality Date   ANTERIOR CERVICAL DECOMP/DISCECTOMY FUSION N/A 04/17/2016   Procedure: ANTERIOR CERVICAL DECOMPRESSION/DISCECTOMY FUSION CERVICAL THREE - CERVICAL FOUR, POSTERIOR CERVICAL DISCECTOMY CERVICAL SEVEN -THORASIC ONE LEFT;  Surgeon: Ashok Pall, MD;  Location: [facility];  Service: Neurosurgery;  Laterality: N/A;  Anterior/Posterior   ANTERIOR FUSION CERVICAL SPINE  2000   Dr. Carloyn Manner; "for bone spurs; put hardware in too"   BACK SURGERY     COLONOSCOPY  06/10/2016   [facility]   LEFT HEART CATH AND CORONARY ANGIOGRAPHY N/A 03/17/2018   Procedure: LEFT HEART CATH AND CORONARY ANGIOGRAPHY;  Surgeon: Lorretta Harp, MD;  Location: [facility];  Service: Cardiovascular;  Laterality: N/A;   LUMBAR LAMINECTOMY Left 06/30/2013   Procedure: Left L3-4 Extraforaminal approach to excise far lateral herniated nucleus pulposus;  Surgeon: Jessy Oto, MD;  Location: [facility];  Service: Orthopedics;  Laterality: Left;   TUBAL LIGATION     VAGINAL HYSTERECTOMY     Social History   Occupational History   Not on file  Tobacco Use   Smoking status: Former    Packs/day: 0.50    Years: 20.00    Pack  years: 10.00    Types: Cigarettes    Quit date: 06/30/1982    Years since quitting: 38.8   Smokeless tobacco: Never  Vaping Use   Vaping Use: Never used  Substance and Sexual Activity   Alcohol use: Not Currently    Comment: 05/04/2016 "might have a drink a couple days/year; might not"   Drug use: No   Sexual activity: Not Currently

## 2021-05-14 NOTE — Patient Instructions (Signed)
Avoid bending, stooping and avoid lifting weights greater than 10 lbs. Avoid prolong standing and walking. Avoid frequent bending and stooping  No lifting greater than 10 lbs. May use ice or moist heat for pain. Weight loss is of benefit. Handicap license is approved. MRI of the lumbar spine ordered

## 2021-05-18 HISTORY — PX: COLONOSCOPY: SHX174

## 2021-05-20 ENCOUNTER — Telehealth: Payer: Self-pay

## 2021-05-20 NOTE — Telephone Encounter (Signed)
Pt called into the office stating that the tramadol that was sent in makes her head hurt and was wondering if there is anything else that can be sent in to help instead ?

## 2021-05-21 ENCOUNTER — Encounter: Payer: Self-pay | Admitting: Podiatry

## 2021-05-21 ENCOUNTER — Other Ambulatory Visit: Payer: Self-pay

## 2021-05-21 ENCOUNTER — Other Ambulatory Visit: Payer: Self-pay | Admitting: Specialist

## 2021-05-21 ENCOUNTER — Ambulatory Visit (INDEPENDENT_AMBULATORY_CARE_PROVIDER_SITE_OTHER): Payer: Medicare Other | Admitting: Podiatry

## 2021-05-21 DIAGNOSIS — M79674 Pain in right toe(s): Secondary | ICD-10-CM

## 2021-05-21 DIAGNOSIS — E1151 Type 2 diabetes mellitus with diabetic peripheral angiopathy without gangrene: Secondary | ICD-10-CM

## 2021-05-21 DIAGNOSIS — E119 Type 2 diabetes mellitus without complications: Secondary | ICD-10-CM | POA: Diagnosis not present

## 2021-05-21 DIAGNOSIS — Q828 Other specified congenital malformations of skin: Secondary | ICD-10-CM

## 2021-05-21 DIAGNOSIS — B351 Tinea unguium: Secondary | ICD-10-CM

## 2021-05-21 DIAGNOSIS — M79675 Pain in left toe(s): Secondary | ICD-10-CM

## 2021-05-21 DIAGNOSIS — M2042 Other hammer toe(s) (acquired), left foot: Secondary | ICD-10-CM

## 2021-05-21 DIAGNOSIS — M2041 Other hammer toe(s) (acquired), right foot: Secondary | ICD-10-CM | POA: Diagnosis not present

## 2021-05-21 MED ORDER — CODEINE SULFATE 30 MG PO TABS: 30.0000 mg | ORAL_TABLET | Freq: Four times a day (QID) | ORAL | 0 refills | Status: AC | PRN

## 2021-05-21 NOTE — Progress Notes (Signed)
ANNUAL DIABETIC FOOT EXAM  Subjective: Angela Pugh presents today for for annual diabetic foot examination, at risk foot care. Pt has h/o NIDDM with PAD, and painful porokeratotic lesion(s) bilaterally and painful mycotic toenails that limit ambulation. Painful toenails interfere with ambulation. Aggravating factors include wearing enclosed shoe gear. Pain is relieved with periodic professional debridement. Painful porokeratotic lesions are aggravated when weightbearing with and without shoegear. Pain is relieved with periodic professional debridement.  Patient relates 10-12 year h/o diabetes.  Patient denies any h/o foot wounds.  Patient endorses symptoms of foot numbness in left great toe due to left sided hip bursitis.  Patient denies symptoms of foot tingling.  Patient denies symptoms of burning in feet.  Patient denies symptoms of pins/needles in feet.  Patient denies  tingling, burning, or pins/needle sensation in feet.  Patient did not check blood glucose this morning. States A1c is around 6.0%.  [name], MD is patient's PCP. Last visit was 01/15/2021.  Past Medical History:  Diagnosis Date   Arthritis    back, knees, shoulder, hands , injections in pelvis, for bone spurs (05/04/2016)   Chronic back pain    Diverticulosis    History of kidney stones    HLD (hyperlipidemia)    Hypertension    Migraine    hx (05/04/2016)   Peripheral neuropathy    Radiculopathy    Sinus complaint    Sinus headache    occasional (05/04/2016)   Type II diabetes mellitus ([place])    diet controlled, no meds now, was on insulin & po treatment at one time     Vertigo    Patient Active Problem List   Diagnosis Date Noted   Headache above the eye region 02/19/2021   Nasal congestion 02/19/2021   Emphysema lung ([place]) 10/04/2020   Abdominal pain, epigastric 03/18/2020   Low back pain without sciatica 10/30/2019   Body mass index (BMI) 26.0-26.9, adult 10/23/2019    Nonischemic cardiomyopathy ([place]) 04/12/2018   Chest pain 01/25/2018   Left bundle branch block 01/25/2018   Carotid artery disease ([place]) 01/25/2018   Seasonal allergies 10/18/2017   Arthritis 10/18/2017   Pre-diabetes 10/18/2017   Protein-calorie malnutrition, severe 05/05/2016   Dysphagia 05/04/2016   HNP (herniated nucleus pulposus) with myelopathy, cervical 04/17/2016   Abdominal pain 02/01/2016   Acute diverticulitis 02/01/2016   HLD (hyperlipidemia) 02/01/2016   Diabetes mellitus without complication ([place]) 41/74/0814   Hypokalemia 02/01/2016   Hypertension    Cervical spondylosis without myelopathy 01/13/2016   Bilateral carpal tunnel syndrome 01/13/2016   Herniated lumbar intervertebral disc 06/30/2013    Class: Acute   Past Surgical History:  Procedure Laterality Date   ANTERIOR CERVICAL DECOMP/DISCECTOMY FUSION N/A 04/17/2016   Procedure: ANTERIOR CERVICAL DECOMPRESSION/DISCECTOMY FUSION CERVICAL THREE - CERVICAL FOUR, POSTERIOR CERVICAL DISCECTOMY CERVICAL SEVEN -THORASIC ONE LEFT;  Surgeon: Ashok Pall, MD;  Location: [facility];  Service: Neurosurgery;  Laterality: N/A;  Anterior/Posterior   ANTERIOR FUSION CERVICAL SPINE  2000   Dr. Carloyn Manner; "for bone spurs; put hardware in too"   BACK SURGERY     COLONOSCOPY  06/10/2016   [facility]   LEFT HEART CATH AND CORONARY ANGIOGRAPHY N/A 03/17/2018   Procedure: LEFT HEART CATH AND CORONARY ANGIOGRAPHY;  Surgeon: Lorretta Harp, MD;  Location: [facility];  Service: Cardiovascular;  Laterality: N/A;   LUMBAR LAMINECTOMY Left 06/30/2013   Procedure: Left L3-4 Extraforaminal approach to excise far lateral herniated nucleus pulposus;  Surgeon: Jessy Oto, MD;  Location:  [facility];  Service: Orthopedics;  Laterality: Left;   TUBAL LIGATION     VAGINAL HYSTERECTOMY     Current Outpatient Medications on File Prior to Visit  Medication Sig Dispense Refill   amLODipine (NORVASC) 5 MG tablet TAKE 1 TABLET(5 MG) BY MOUTH DAILY 15 tablet 3    Aromatic Inhalants (VICKS VAPOR INHALER IN) Inhale 1 puff into the lungs as needed.     aspirin 81 MG EC tablet Take 81 mg by mouth daily. Swallow whole.     Calcium Carb-Cholecalciferol (CALCIUM 1000 + D PO)      diazepam (VALIUM) 5 MG tablet Take one tablet after signing papers at MRI and may repeat after 15-20 mins if necessary for anxiety. 2 tablet 0   famotidine (PEPCID) 20 MG tablet Take 1 tablet (20 mg total) by mouth 3 times/day as needed-between meals & bedtime for heartburn or indigestion. 30 tablet 0   fexofenadine (ALLEGRA ALLERGY) 180 MG tablet Take 1 tablet (180 mg total) by mouth daily. 30 tablet 3   hyoscyamine (LEVSIN SL) 0.125 MG SL tablet Take 1 to 2 tablets under the tongue as needed for cramping and abdominal bloating. 50 tablet 3   lidocaine (LIDODERM) 5 % Place 1 patch onto the skin daily. Remove & Discard patch within 12 hours or as directed by MD 30 patch 0   mesalamine (LIALDA) 1.2 g EC tablet Take 4 tablets (4.8 g total) by mouth daily with breakfast. 360 tablet 3   methocarbamol (ROBAXIN) 500 MG tablet Take 1 tablet (500 mg total) by mouth 2 (two) times daily. 20 tablet 0   Multiple Vitamins-Minerals (CENTRUM ADULTS PO) Take 1 tablet by mouth daily.      olmesartan (BENICAR) 5 MG tablet Take 2 tablets (10 mg total) by mouth daily. 30 tablet 5   Omega-3 Fatty Acids (FISH OIL PO) 2,000 mg daily.     pantoprazole (PROTONIX) 40 MG tablet Take 1 tablet (40 mg total) by mouth daily. 90 tablet 3   potassium chloride SA (KLOR-CON) 20 MEQ tablet Take 20 mEq by mouth daily.     rosuvastatin (CRESTOR) 20 MG tablet Take 1 tablet (20 mg total) by mouth daily. 90 tablet 3   traMADol (ULTRAM) 50 MG tablet Take 1 tablet (50 mg total) by mouth every 6 (six) hours as needed. 30 tablet 0   triamcinolone cream (KENALOG) 0.5 % APPLY EXTERNALLY TO THE AFFECTED AREA TWICE DAILY AS NEEDED FOR DRY SKIN 30 g 3   vitamin B-12 (CYANOCOBALAMIN) 1000 MCG tablet Take 1,000 mcg by mouth daily.      No current facility-administered medications on file prior to visit.    Allergies  Allergen Reactions   Acetaminophen Hives and Nausea Only   Azithromycin Hives and Nausea And Vomiting   Lipitor [Atorvastatin] Nausea Only    weakness   Zyrtec [Cetirizine]     Hives    Social History   Occupational History   Not on file  Tobacco Use   Smoking status: Some Days    Packs/day: 0.50    Years: 20.00    Pack years: 10.00    Types: Cigarettes    Last attempt to quit: 06/30/1982    Years since quitting: 38.9   Smokeless tobacco: Never   Tobacco comments:    Patient only smokes in social setting when drinking. Admits former h/o heavy smoking years ago smoked 1.5 packs/day.  Vaping Use   Vaping Use: Never used  Substance and Sexual Activity  Alcohol use: Not Currently    Comment: 05/04/2016 "might have a drink a couple days/year; might not"   Drug use: No   Sexual activity: Not Currently   Family History  Problem Relation Age of Onset   Uterine cancer Mother    Diabetes type II Sister    Diabetes Mellitus II Brother    Colon cancer Neg Hx    Pancreatic cancer Neg Hx    Esophageal cancer Neg Hx    Colon polyps Neg Hx    Rectal cancer Neg Hx    Stomach cancer Neg Hx    Immunization History  Administered Date(s) Administered   PFIZER(Purple Top)SARS-COV-2 Vaccination 06/24/2019, 07/15/2019, 02/27/2020     Review of Systems: Negative except as noted in the HPI.   Objective: There were no vitals filed for this visit.  Teghan Philbin is a pleasant 76 y.o. female in NAD. AAO X 3.  Vascular Examination: CFT <3 seconds b/l LE. Palpable DP pulse(s) right lower extremity Faintly palpable PT pulse(s) b/l LE. Diminished DP pulse(s) left lower extremity. Pedal hair sparse. No pain with calf compression b/l. Lower extremity skin temperature gradient within normal limits. No edema noted b/l LE. No ischemia or gangrene noted b/l LE. No cyanosis or clubbing noted b/l  LE.  Dermatological Examination: Pedal integument with normal turgor, texture and tone b/l LE. No open wounds b/l. No interdigital macerations b/l. Toenails 1-5 b/l elongated, thickened, discolored with subungual debris. +Tenderness with dorsal palpation of nailplates. Porokeratotic lesion(s) noted plantar heel pad of right foot, posterolateral aspect of right heel, and submet head 2 b/l.  Musculoskeletal Examination: Normal muscle strength 5/5 to all lower extremity muscle groups bilaterally. Hammertoe deformity noted 2-5 b/l.Marland Kitchen No pain, crepitus or joint limitation noted with ROM b/l LE.  Patient ambulates independently without assistive aids.  Footwear Assessment: Does the patient wear appropriate shoes? Yes. Does the patient need inserts/orthotics? Yes.  Neurological Examination: Pt has subjective symptoms of neuropathy. Protective sensation intact 5/5 intact bilaterally with 10g monofilament b/l. Vibratory sensation intact b/l. Clonus negative b/l.  Hemoglobin A1C Latest Ref Rng & Units 10/03/2020  HGBA1C 4.6 - 6.5 % 6.3  Some recent data might be hidden   Assessment: 1. Pain due to onychomycosis of toenails of both feet   2. Porokeratosis   3. Acquired hammertoes of both feet   4. Type II diabetes mellitus with peripheral circulatory disorder (HCC)   5. Encounter for diabetic foot exam ([place])     ADA Risk Categorization: High Risk  Patient has one or more of the following: Loss of protective sensation Absent pedal pulses Severe Foot deformity History of foot ulcer  Plan: -Examined patient. -Discussed ordering test to assess arterial circulation as she still smokes an occasional cigarette in a social setting when drinking alcohol, but is not an everyday smoker any longer. She does have previous h/o smoking 1.5 packs/day. Given risk factors and findings today, will order arterial dopplers. I will discuss with Cardiologist. She has no open wounds/tissue loss, no rest pain, no  claudication. -Diabetic foot examination performed today. -Continue foot and shoe inspections daily. Monitor blood glucose per PCP/Endocrinologist's recommendations. -Mycotic toenails 1-5 bilaterally were debrided in length and girth with sterile nail nippers and dremel without incident. -Painful porokeratotic lesion(s) plantar heel pad of right foot, posterolateral aspect of right heel, and submet head 2 b/l pared and enucleated with sterile scalpel blade without incident. Total number of lesions debrided=4. -Patient/POA to call should there be question/concern in the  interim.  Return in about 3 months (around 08/19/2021).  Marzetta Board, DPM

## 2021-05-21 NOTE — Telephone Encounter (Signed)
I called and lmom advised that Dr. Louanne Skye did change her medication

## 2021-05-23 ENCOUNTER — Telehealth: Payer: Self-pay

## 2021-05-23 NOTE — Addendum Note (Signed)
Addended by: Marzetta Board on: 05/23/2021 01:04 PM   Modules accepted: Orders

## 2021-05-26 NOTE — Telephone Encounter (Signed)
Dr. Louanne Skye is scheduling and MRI for the same side. She would like to know that if what she is being seen for is related, could she have her imaging completed at the same time.   Please advise.

## 2021-05-26 NOTE — Telephone Encounter (Signed)
Spoke with patient to notify, she says thank you.

## 2021-05-28 ENCOUNTER — Other Ambulatory Visit: Payer: Self-pay | Admitting: Podiatry

## 2021-05-28 DIAGNOSIS — E1151 Type 2 diabetes mellitus with diabetic peripheral angiopathy without gangrene: Secondary | ICD-10-CM

## 2021-06-04 ENCOUNTER — Other Ambulatory Visit: Payer: Self-pay

## 2021-06-04 ENCOUNTER — Telehealth: Payer: Self-pay | Admitting: Pharmacist

## 2021-06-04 ENCOUNTER — Ambulatory Visit (HOSPITAL_COMMUNITY)
Admission: RE | Admit: 2021-06-04 | Discharge: 2021-06-04 | Disposition: A | Discharge status: Home / Self Care | Payer: Medicare Other | Source: Ambulatory Visit | Attending: Cardiovascular Disease | Admitting: Cardiovascular Disease

## 2021-06-04 DIAGNOSIS — E1151 Type 2 diabetes mellitus with diabetic peripheral angiopathy without gangrene: Secondary | ICD-10-CM | POA: Insufficient documentation

## 2021-06-04 NOTE — Chronic Care Management (AMB) (Signed)
Chronic Care Management Pharmacy Assistant   Name: Angela Pugh  MRN: [id number] DOB: 1945-05-29  Reason for Encounter: Disease State   Conditions to be addressed/monitored: HTN  Recent office visits:  None  Recent consult visits:  05/21/2020 Angela Pugh DPM (podiatry) - Patient was seen for pain due to onychomycosis of toenails of both feet and additional issues. No medication changes. Follow up in 3 weeks.   05/14/2021 Angela Dess MD (orthopedic) - Patient was seen for Spondylolisthesis, lumbar region and additional issues. Started Diazepam 5 mg as needed for anxiety and Tramadol 50 mg every 6 hours as needed. Follow up in 3 weeks.   Hospital visits:  Patient was seen at [facility] ED on 05/11/2021 due to Acute bilateral low back pain with left-sided sciatica and Viral upper respiratory tract infection Discharged after 3 hours.  New?Medications Started at [facility] Discharge:??  Lidocaine 5% patch 1 patch Transdermal Every 24 hours, Remove & Discard patch within 12 hours Methocarbamol 500 mg twice daily Medication Changes at Hospital Discharge: No medication changes.  Medications Discontinued at Hospital Discharge: No medications discontinued. Medications that remain the same after Hospital Discharge:??  -All other medications will remain the same.    Medications: Outpatient Encounter Medications as of 06/04/2021  Medication Sig   amLODipine (NORVASC) 5 MG tablet TAKE 1 TABLET(5 MG) BY MOUTH DAILY   Aromatic Inhalants (VICKS VAPOR INHALER IN) Inhale 1 puff into the lungs as needed.   aspirin 81 MG EC tablet Take 81 mg by mouth daily. Swallow whole.   Calcium Carb-Cholecalciferol (CALCIUM 1000 + D PO)    diazepam (VALIUM) 5 MG tablet Take one tablet after signing papers at MRI and may repeat after 15-20 mins if necessary for anxiety.   famotidine (PEPCID) 20 MG tablet Take 1 tablet (20 mg total) by mouth 3 times/day as needed-between meals &  bedtime for heartburn or indigestion.   fexofenadine (ALLEGRA ALLERGY) 180 MG tablet Take 1 tablet (180 mg total) by mouth daily.   hyoscyamine (LEVSIN SL) 0.125 MG SL tablet Take 1 to 2 tablets under the tongue as needed for cramping and abdominal bloating.   lidocaine (LIDODERM) 5 % Place 1 patch onto the skin daily. Remove & Discard patch within 12 hours or as directed by MD   mesalamine (LIALDA) 1.2 g EC tablet Take 4 tablets (4.8 g total) by mouth daily with breakfast.   methocarbamol (ROBAXIN) 500 MG tablet Take 1 tablet (500 mg total) by mouth 2 (two) times daily.   Multiple Vitamins-Minerals (CENTRUM ADULTS PO) Take 1 tablet by mouth daily.    olmesartan (BENICAR) 5 MG tablet Take 2 tablets (10 mg total) by mouth daily.   Omega-3 Fatty Acids (FISH OIL PO) 2,000 mg daily.   pantoprazole (PROTONIX) 40 MG tablet Take 1 tablet (40 mg total) by mouth daily.   potassium chloride SA (KLOR-CON) 20 MEQ tablet Take 20 mEq by mouth daily.   rosuvastatin (CRESTOR) 20 MG tablet Take 1 tablet (20 mg total) by mouth daily.   traMADol (ULTRAM) 50 MG tablet Take 1 tablet (50 mg total) by mouth every 6 (six) hours as needed.   triamcinolone cream (KENALOG) 0.5 % APPLY EXTERNALLY TO THE AFFECTED AREA TWICE DAILY AS NEEDED FOR DRY SKIN   vitamin B-12 (CYANOCOBALAMIN) 1000 MCG tablet Take 1,000 mcg by mouth daily.   No facility-administered encounter medications on file as of 06/04/2021.  Fill History: ALLERGY RELIEF(FEXOFENADINE)180MG  T 04/20/2021 30   LIDOCAINE 5% PATCH  05/11/2021 30   MESALAMINE 1.2GM TABLETS 03/18/2021 90   METHOCARBAMOL 500MG  TABLETS 05/11/2021 10   PANTOPRAZOLE 40MG  TABLETS 05/26/2021 90   POTASSIUM CL 20MEQ ER TABLETS 01/12/2021 90   ROSUVASTATIN 20MG  TABLETS 05/12/2021 90   TRIAMCINOLONE 0.5% CREAM 15GM 04/15/2021 14   AMLODIPINE BESYLATE 5MG  TABLETS 05/06/2021 15   Reviewed chart prior to disease state call. Spoke with patient regarding BP  Recent Office Vitals: BP  Readings from Last 3 Encounters:  05/14/21 120/75  05/11/21 139/76  04/21/21 (!) 142/70   Pulse Readings from Last 3 Encounters:  05/14/21 64  05/11/21 72  03/05/21 60    Wt Readings from Last 3 Encounters:  05/14/21 167 lb (75.8 kg)  03/05/21 169 lb (76.7 kg)  01/28/21 165 lb (74.8 kg)     Kidney Function Lab Results  Component Value Date/Time   CREATININE 1.04 10/03/2020 09:43 AM   CREATININE 0.98 03/18/2020 11:41 AM   CREATININE 0.99 (H) 01/12/2020 11:31 AM   GFR 53.10 (L) 10/03/2020 09:43 AM   GFRNONAA 57 (L) 01/12/2020 11:31 AM   GFRAA 66 01/12/2020 11:31 AM    BMP Latest Ref Rng & Units 10/03/2020 03/18/2020 01/12/2020  Glucose 70 - 99 mg/dL 114(H) 103(H) 111(H)  BUN 6 - 23 mg/dL 14 13 14   Creatinine 0.40 - 1.20 mg/dL 1.04 0.98 0.99(H)  BUN/Creat Ratio 6 - 22 (calc) - - 14  Sodium 135 - 145 mEq/L 140 142 140  Potassium 3.5 - 5.1 mEq/L 4.5 4.0 4.7  Chloride 96 - 112 mEq/L 101 102 103  CO2 19 - 32 mEq/L 32 35(H) 33(H)  Calcium 8.4 - 10.5 mg/dL 9.7 9.7 10.0    Current antihypertensive regimen:  Olmesartan 5 mg daily Amlodipine 5 mg daily  How often are you checking your Blood Pressure? Patient is checking her blood pressures twice per week   Current home BP readings: Patient states they are always good, her last reading was 132/73 and all her readings are in this range.  She states since starting Amlodipine they have been very good.  What recent interventions/DTPs have been made by any provider to improve Blood Pressure control since last CPP Visit: No recent interventions.   Any recent hospitalizations or ED visits since last visit with CPP? No recent hospital visits.   What diet changes have been made to improve Blood Pressure Control?  Patient states she does not follow any special diet Breakfast - patient will have cereal, eggs, salmon, toast  Lunch - Patient will have a salad or something light.  Dinner - Patient will have a meal including a meat and  vegetable.   What exercise is being done to improve your Blood Pressure Control?  Patient stays busy. She helps take care of her grandkids.  Adherence Review: Is the patient currently on ACE/ARB medication? Yes Does the patient have >5 day gap between last estimated fill dates? Yes  Care Gaps: AWV - scheduled for 02/10/2022 Last BP - 120/75 on 05/14/2021 Last A1C - 6.3 on 10/03/2020 Pneumonia vaccine - never done Hep C screen - never done TDAP - never done Covid vaccine - overdue Foot exam - over due   Star Rating Drugs: Olmesartan 5 mg -  last filled 04/15/2020 at [facility], verified with pharmacist. Per patient she has an abundance and has not needed any refills.  Crestor 20 mg - last filled 05/12/2021 at [place] Pharmacist Assistant [phone]

## 2021-06-05 ENCOUNTER — Ambulatory Visit
Admission: RE | Admit: 2021-06-05 | Discharge: 2021-06-05 | Disposition: A | Discharge status: Home / Self Care | Payer: Medicare Other | Source: Ambulatory Visit | Attending: Specialist | Admitting: Specialist

## 2021-06-05 DIAGNOSIS — M4316 Spondylolisthesis, lumbar region: Secondary | ICD-10-CM

## 2021-06-05 DIAGNOSIS — M5417 Radiculopathy, lumbosacral region: Secondary | ICD-10-CM

## 2021-06-05 DIAGNOSIS — M5416 Radiculopathy, lumbar region: Secondary | ICD-10-CM | POA: Diagnosis not present

## 2021-06-05 DIAGNOSIS — M48061 Spinal stenosis, lumbar region without neurogenic claudication: Secondary | ICD-10-CM | POA: Diagnosis not present

## 2021-06-05 DIAGNOSIS — M5136 Other intervertebral disc degeneration, lumbar region: Secondary | ICD-10-CM | POA: Diagnosis not present

## 2021-06-05 DIAGNOSIS — M4807 Spinal stenosis, lumbosacral region: Secondary | ICD-10-CM

## 2021-06-06 ENCOUNTER — Encounter: Payer: Self-pay | Admitting: Podiatry

## 2021-06-06 NOTE — Progress Notes (Signed)
Results of bilateral extremity ABIs on 06/04/2021  reveal no evidence of significant lower extremity arterial disease.

## 2021-06-09 ENCOUNTER — Encounter: Payer: Self-pay | Admitting: Specialist

## 2021-06-09 ENCOUNTER — Ambulatory Visit: Payer: Medicare Other | Admitting: Specialist

## 2021-06-09 ENCOUNTER — Other Ambulatory Visit: Payer: Self-pay

## 2021-06-09 VITALS — BP 184/100 | HR 80 | Ht 66.0 in | Wt 167.0 lb

## 2021-06-09 DIAGNOSIS — M7062 Trochanteric bursitis, left hip: Secondary | ICD-10-CM

## 2021-06-09 DIAGNOSIS — M4316 Spondylolisthesis, lumbar region: Secondary | ICD-10-CM

## 2021-06-09 DIAGNOSIS — M5136 Other intervertebral disc degeneration, lumbar region: Secondary | ICD-10-CM | POA: Diagnosis not present

## 2021-06-09 DIAGNOSIS — M4807 Spinal stenosis, lumbosacral region: Secondary | ICD-10-CM

## 2021-06-09 NOTE — Progress Notes (Signed)
Office Visit Note   Patient: Angela Pugh           Date of Birth: 05/21/1945           MRN: [id number] Visit Date: 06/09/2021              Requested by: [name], MD [place],  [place] [id number] PCP: [name], MD   Assessment & Plan: Visit Diagnoses:  1. Spinal stenosis of lumbosacral region   2. Spondylolisthesis, lumbar region   3. Trochanteric bursitis, left hip   4. Degenerative disc disease, lumbar     Plan: Pain with lying on the left side. Numbness and tingling into the left lateral thigh and calf and into the left great toe. Tender over the left greater trochanter Previous injection in 03/2020 left greater trochanter with good relief.  Avoid bending, stooping and avoid lifting weights greater than 10 lbs. Avoid prolong standing and walking. Avoid frequent bending and stooping  No lifting greater than 10 lbs. May use ice or moist heat for pain. Weight loss is of benefit. Handicap license is approved. PT for the lumbar spine ordered Dr. Romona Curls secretary/Assistant will call to arrange for epidural steroid injection    Follow-Up Instructions: Return in about 4 weeks (around 07/07/2021).   Orders:  Orders Placed This Encounter  Procedures   Ambulatory referral to Physical Medicine Rehab   Ambulatory referral to Physical Therapy   No orders of the defined types were placed in this encounter.     Procedures: No procedures performed   Clinical Data: No additional findings.   Subjective: Chief Complaint  Patient presents with   Left Hip - Follow-up    Had greater troch injection on 05/14/21 and got close to 100% relief from it.  Could hardly walk before the injection    76 year old female with 4 week history of low back pain and difficutly standing and walking.  No bowel or bladder difficulty. Can't walk far befor the left leg wants to give away.    Review of Systems  Constitutional: Negative.   HENT:  Negative.    Eyes: Negative.   Respiratory: Negative.    Cardiovascular: Negative.   Gastrointestinal: Negative.   Endocrine: Negative.   Genitourinary: Negative.   Musculoskeletal: Negative.   Skin: Negative.   Allergic/Immunologic: Negative.   Neurological: Negative.   Hematological: Negative.   Psychiatric/Behavioral: Negative.      Objective: Vital Signs: BP (!) 184/100 (BP Location: Left Arm, Patient Position: Sitting)    Pulse 80    Ht 5\' 6"  (1.676 m)    Wt 167 lb (75.8 kg)    BMI 26.95 kg/m   Physical Exam Constitutional:      Appearance: She is well-developed.  HENT:     Head: Normocephalic and atraumatic.  Eyes:     Pupils: Pupils are equal, round, and reactive to light.  Pulmonary:     Effort: Pulmonary effort is normal.     Breath sounds: Normal breath sounds.  Abdominal:     General: Bowel sounds are normal.     Palpations: Abdomen is soft.  Musculoskeletal:     Cervical back: Normal range of motion and neck supple.     Lumbar back: Negative right straight leg raise test and negative left straight leg raise test.  Skin:    General: Skin is warm and dry.  Neurological:     Mental Status: She is alert and oriented to person,  place, and time.  Psychiatric:        Behavior: Behavior normal.        Thought Content: Thought content normal.        Judgment: Judgment normal.    Back Exam   Tenderness  The patient is experiencing tenderness in the lumbar.  Range of Motion  Extension:  abnormal  Flexion:  abnormal  Lateral bend right:  abnormal  Lateral bend left:  abnormal  Rotation right:  abnormal  Rotation left:  abnormal   Muscle Strength  Right Quadriceps:  5/5  Left Quadriceps:  5/5  Right Hamstrings:  5/5  Left Hamstrings:  5/5   Tests  Straight leg raise right: negative Straight leg raise left: negative  Reflexes  Patellar:  0/4 Achilles:  0/4 Babinski's sign: normal   Other  Toe walk: abnormal Heel walk:  abnormal     Specialty Comments:  No specialty comments available.  Imaging: No results found.   PMFS History: Patient Active Problem List   Diagnosis Date Noted   Herniated lumbar intervertebral disc 06/30/2013    Priority: High    Class: Acute   Headache above the eye region 02/19/2021   Nasal congestion 02/19/2021   Emphysema lung ([place]) 10/04/2020   Abdominal pain, epigastric 03/18/2020   Low back pain without sciatica 10/30/2019   Body mass index (BMI) 26.0-26.9, adult 10/23/2019   Nonischemic cardiomyopathy ([place]) 04/12/2018   Chest pain 01/25/2018   Left bundle branch block 01/25/2018   Carotid artery disease ([place]) 01/25/2018   Seasonal allergies 10/18/2017   Arthritis 10/18/2017   Pre-diabetes 10/18/2017   Protein-calorie malnutrition, severe 05/05/2016   Dysphagia 05/04/2016   HNP (herniated nucleus pulposus) with myelopathy, cervical 04/17/2016   Abdominal pain 02/01/2016   Acute diverticulitis 02/01/2016   HLD (hyperlipidemia) 02/01/2016   Diabetes mellitus without complication ([place]) 37/16/9678   Hypokalemia 02/01/2016   Hypertension    Cervical spondylosis without myelopathy 01/13/2016   Bilateral carpal tunnel syndrome 01/13/2016   Past Medical History:  Diagnosis Date   Arthritis    back, knees, shoulder, hands , injections in pelvis, for bone spurs (05/04/2016)   Chronic back pain    Diverticulosis    History of kidney stones    HLD (hyperlipidemia)    Hypertension    Migraine    hx (05/04/2016)   Peripheral neuropathy    Radiculopathy    Sinus complaint    Sinus headache    occasional (05/04/2016)   Type II diabetes mellitus (HCC)    diet controlled, no meds now, was on insulin & po treatment at one time     Vertigo     Family History  Problem Relation Age of Onset   Uterine cancer Mother    Diabetes type II Sister    Diabetes Mellitus II Brother    Colon cancer Neg Hx    Pancreatic cancer Neg Hx    Esophageal cancer Neg Hx    Colon  polyps Neg Hx    Rectal cancer Neg Hx    Stomach cancer Neg Hx     Past Surgical History:  Procedure Laterality Date   ANTERIOR CERVICAL DECOMP/DISCECTOMY FUSION N/A 04/17/2016   Procedure: ANTERIOR CERVICAL DECOMPRESSION/DISCECTOMY FUSION CERVICAL THREE - CERVICAL FOUR, POSTERIOR CERVICAL DISCECTOMY CERVICAL SEVEN -THORASIC ONE LEFT;  Surgeon: Ashok Pall, MD;  Location: [place];  Service: Neurosurgery;  Laterality: N/A;  Anterior/Posterior   ANTERIOR FUSION CERVICAL SPINE  2000   Dr. Carloyn Manner; "for bone spurs; put hardware in  too"   BACK SURGERY     COLONOSCOPY  06/10/2016   [facility]   LEFT HEART CATH AND CORONARY ANGIOGRAPHY N/A 03/17/2018   Procedure: LEFT HEART CATH AND CORONARY ANGIOGRAPHY;  Surgeon: Lorretta Harp, MD;  Location: [facility];  Service: Cardiovascular;  Laterality: N/A;   LUMBAR LAMINECTOMY Left 06/30/2013   Procedure: Left L3-4 Extraforaminal approach to excise far lateral herniated nucleus pulposus;  Surgeon: Jessy Oto, MD;  Location: [facility];  Service: Orthopedics;  Laterality: Left;   TUBAL LIGATION     VAGINAL HYSTERECTOMY     Social History   Occupational History   Not on file  Tobacco Use   Smoking status: Some Days    Packs/day: 0.50    Years: 20.00    Pack years: 10.00    Types: Cigarettes    Last attempt to quit: 06/30/1982    Years since quitting: 38.9   Smokeless tobacco: Never   Tobacco comments:    Patient only smokes in social setting when drinking. Admits former h/o heavy smoking years ago smoked 1.5 packs/day.  Vaping Use   Vaping Use: Never used  Substance and Sexual Activity   Alcohol use: Not Currently    Comment: 05/04/2016 "might have a drink a couple days/year; might not"   Drug use: No   Sexual activity: Not Currently

## 2021-06-09 NOTE — Patient Instructions (Signed)
Pain with lying on the left side. Numbness and tingling into the left lateral thigh and calf and into the left great toe. Tender over the left greater trochanter Previous injection in 03/2020 left greater trochanter with good relief.  Avoid bending, stooping and avoid lifting weights greater than 10 lbs. Avoid prolong standing and walking. Avoid frequent bending and stooping  No lifting greater than 10 lbs. May use ice or moist heat for pain. Weight loss is of benefit. Handicap license is approved. PT for the lumbar spine ordered Dr. Romona Curls secretary/Assistant will call to arrange for epidural steroid injection

## 2021-06-10 NOTE — Telephone Encounter (Signed)
-----   Message from Marzetta Board, DPM sent at 06/10/2021  8:23 AM EST ----- Please give her a call and let her know her circulation test was normal.  Thanks!  Dr. Darnell Level.

## 2021-06-16 ENCOUNTER — Other Ambulatory Visit: Payer: Self-pay | Admitting: Gastroenterology

## 2021-06-16 ENCOUNTER — Telehealth: Payer: Self-pay | Admitting: Cardiovascular Disease

## 2021-06-16 DIAGNOSIS — R1013 Epigastric pain: Secondary | ICD-10-CM

## 2021-06-16 NOTE — Telephone Encounter (Signed)
Pt c/o medication issue:  1. Name of Medication:  amLODipine (NORVASC) 5 MG tablet  2. How are you currently taking this medication (dosage and times per day)? As prescribed   3. Are you having a reaction (difficulty breathing--STAT)? No   4. What is your medication issue? Patient is calling wanting to know why her last prescription was only made out for 15 tablets with 3 refills. Patient is currently out of medication. Please advise.

## 2021-06-18 NOTE — Therapy (Signed)
OUTPATIENT PHYSICAL THERAPY THORACOLUMBAR EVALUATION   Patient Name: Angela Pugh MRN: [id number] DOB:1945-08-22, 76 y.o., female Today's Date: 06/19/2021   PT End of Session - 06/19/21 0927     Visit Number 1    Number of Visits 20    Date for PT Re-Evaluation 08/28/21    Authorization Type UHC medicare, A[employer]    Progress Note Due on Visit 10    PT Start Time 0932    PT Stop Time 1012    PT Time Calculation (min) 40 min    Activity Tolerance Patient tolerated treatment well    Behavior During Therapy [facility] for tasks assessed/performed             Past Medical History:  Diagnosis Date   Arthritis    back, knees, shoulder, hands , injections in pelvis, for bone spurs (05/04/2016)   Chronic back pain    Diverticulosis    History of kidney stones    HLD (hyperlipidemia)    Hypertension    Migraine    hx (05/04/2016)   Peripheral neuropathy    Radiculopathy    Sinus complaint    Sinus headache    occasional (05/04/2016)   Type II diabetes mellitus ([place])    diet controlled, no meds now, was on insulin & po treatment at one time     Vertigo    Past Surgical History:  Procedure Laterality Date   ANTERIOR CERVICAL DECOMP/DISCECTOMY FUSION N/A 04/17/2016   Procedure: ANTERIOR CERVICAL DECOMPRESSION/DISCECTOMY FUSION CERVICAL THREE - CERVICAL FOUR, POSTERIOR CERVICAL DISCECTOMY CERVICAL SEVEN -THORASIC ONE LEFT;  Surgeon: Ashok Pall, MD;  Location: [facility];  Service: Neurosurgery;  Laterality: N/A;  Anterior/Posterior   ANTERIOR FUSION CERVICAL SPINE  2000   Dr. Carloyn Manner; "for bone spurs; put hardware in too"   BACK SURGERY     COLONOSCOPY  06/10/2016   [facility]   LEFT HEART CATH AND CORONARY ANGIOGRAPHY N/A 03/17/2018   Procedure: LEFT HEART CATH AND CORONARY ANGIOGRAPHY;  Surgeon: Lorretta Harp, MD;  Location: [place] [facility];  Service: Cardiovascular;  Laterality: N/A;   LUMBAR LAMINECTOMY Left 06/30/2013   Procedure: Left L3-4 Extraforaminal approach to excise far  lateral herniated nucleus pulposus;  Surgeon: Jessy Oto, MD;  Location: [place];  Service: Orthopedics;  Laterality: Left;   TUBAL LIGATION     VAGINAL HYSTERECTOMY     Patient Active Problem List   Diagnosis Date Noted   Headache above the eye region 02/19/2021   Nasal congestion 02/19/2021   Emphysema lung ([place]) 10/04/2020   Abdominal pain, epigastric 03/18/2020   Low back pain without sciatica 10/30/2019   Body mass index (BMI) 26.0-26.9, adult 10/23/2019   Nonischemic cardiomyopathy ([place]) 04/12/2018   Chest pain 01/25/2018   Left bundle branch block 01/25/2018   Carotid artery disease ([place]) 01/25/2018   Seasonal allergies 10/18/2017   Arthritis 10/18/2017   Pre-diabetes 10/18/2017   Protein-calorie malnutrition, severe 05/05/2016   Dysphagia 05/04/2016   HNP (herniated nucleus pulposus) with myelopathy, cervical 04/17/2016   Abdominal pain 02/01/2016   Acute diverticulitis 02/01/2016   HLD (hyperlipidemia) 02/01/2016   Diabetes mellitus without complication ([place]) 69/62/9528   Hypokalemia 02/01/2016   Hypertension    Cervical spondylosis without myelopathy 01/13/2016   Bilateral carpal tunnel syndrome 01/13/2016   Herniated lumbar intervertebral disc 06/30/2013    Class: Acute    PCP: [name], MD  REFERRING PROVIDER: Jessy Oto, MD  REFERRING DIAG: [phone] (ICD-10-CM) - Spinal stenosis of lumbosacral  region M43.16 (ICD-10-CM) - Spondylolisthesis, lumbar region M70.62 (ICD-10-CM) - Trochanteric bursitis, left hip M51.36 (ICD-10-CM) - Degenerative disc disease, lumbar  THERAPY DIAG:  Chronic bilateral low back pain without sciatica  Pain in left hip  Muscle weakness (generalized)  Abnormal posture  Difficulty in walking, not elsewhere classified  ONSET DATE: 05/11/2021  SUBJECTIVE:                                                                                                                                                                                            SUBJECTIVE STATEMENT: Patient indicated ongoing problem but on Christmas Day she had to go to emergency room due to back/Lt hip pain.  Patient indicated she got a temporary improvement from a shot and ended up seeing Dr. Louanne Skye and ended up with Mri testing (see chart).  Patient stated she was scheduled for an injection for hip possibly next week.  She indicated no trouble sleeping PERTINENT HISTORY:  peripherial neuropathy, DM 2, hyperlipidemia.  Previous bone spurs removed in neck, mid back surgery in past but patient denied hardware  PAIN:  Are you having pain? Yes NPRS scale: currently: 8/10, at best 0/10 Pain location: back, Lt hip Pain orientation: lower back, Lt hip PAIN TYPE: chronic  Pain description: intermittent , achy, throbbing, stiff Aggravating factors: standing prolonged (10-15 mins), prolonged sitting, walking prolonged (sometimes due to back/hip, sometimes due to Lt knee giving way, picking/up lifting items, bending, household activity. Relieving factors: sitting rest, medication  PRECAUTIONS: None  WEIGHT BEARING RESTRICTIONS No  FALLS:  Has patient fallen in last 6 months? No, Number of falls: 0  LIVING ENVIRONMENT: Lives with: lives alone Lives in: House/apartment Stairs: 3 stairs to enter house in back, 1 in front with no hand rails   OCCUPATION: Retired. Hobbies: yardwork  PLOF: Independent  PATIENT GOALS Reduce pain to do normal life.    OBJECTIVE:   DIAGNOSTIC FINDINGS:  Lumbar spondylolisthesis, DDD L4-5.  MRI  PATIENT SURVEYS:  FOTO 06/19/2021  intake: 38 predicted: 44  SCREENING FOR RED FLAGS:  06/19/2021: Bowel or bladder incontinence: No Cauda equina syndrome: No   COGNITION:  06/19/2021: Overall cognitive status: Within functional limits for tasks assessed     SENSATION:  06/19/2021:Light touch: Appears intact   MUSCLE LENGTH: 06/19/2021: Passive SLR Lt 65 degrees Passive SLR Rt 75 degrees  POSTURE:  06/19/2021:  forward trunk lean, weight shift to Rt due to Lt knee complaints  PALPATION: 06/19/2021: Mild tenderness across lumbar region bilaterally in paraspinals, QL  LUMBARAROM/PROM  A/PROM A  06/19/2021  Flexion Movement to mid shin c Lt lumbar/posterior Lt hip pain  Extension 25 % WFL c end range pain lumbar.  Repeated x 5 in standing produced similar end range pain.  Marked reduction in lumbar mobility (thoracolumbar junction hinge movement noted)  Right lateral flexion   Left lateral flexion   Right rotation   Left rotation    (Blank rows = not tested)  LE AROM/PROM:  A/PROM Right 06/19/2021 Left 06/19/2021  Hip flexion    Hip extension    Hip abduction    Hip adduction    Hip internal rotation PROM measured in supine 90 deg hip flexion: 25  PROM measured in supine 90 deg hip flexion:  30  Hip external rotation PROM measured in supine 90 deg hip flexion: 50 PROM measured in supine 90 deg hip flexion: 40  Knee flexion    Knee extension    Ankle dorsiflexion    Ankle plantarflexion    Ankle inversion    Ankle eversion     (Blank rows = not tested)  LE MMT:  MMT Right 06/19/2021 Left 06/19/2021  Hip flexion 5/5  4/5  Hip extension    Hip abduction 3+/5 2+/5 c pain  Hip adduction    Hip internal rotation    Hip external rotation    Knee flexion 5/5 5/5  Knee extension 5/5 4/5  Ankle dorsiflexion 5/5 5/5  Ankle plantarflexion    Ankle inversion    Ankle eversion     (Blank rows = not tested)  LUMBAR SPECIAL TESTS:  06/19/2021:(-) slump bilateral, (-) crossed slr bilateral for radicular symptoms produced.  + FABER Lt hip for pain  FUNCTIONAL TESTS:  06/19/2021: 18 inch chair s UE assist: increased difficulty but able to perform, decreased control upon lowering.   GAIT: 06/19/2021: Reduced stance on Lt leg, maintained knee flexion noted in stance.  Independent ambulation noted    TODAY'S TREATMENT  06/19/2021:  Therex:  HEP instruction/performance c cues for techniques, handout  provided.  Trial set performed of each for comprehension and symptom assessment.  Additional time spent in review of techniques.  TherActivity: Cues and performance given for supine to sit to supine transfers to promote improved mechanics and reduced strain on hips/back.  Focus on log rolling technique for performance   PATIENT EDUCATION:  06/19/2021: Education details: HEP, POC Person educated: Patient Education method: Consulting civil engineer, Demonstration, Verbal cues, and Handouts Education comprehension: verbalized understanding and returned demonstration   HOME EXERCISE PROGRAM: 06/19/2021: Access Code: [email or web address] URL: [email or web address] Date: 06/19/2021 Prepared by: Scot Jun  Exercises Supine Lower Trunk Rotation - 2 x daily - 7 x weekly - 1 sets - 5 reps - 15 hold Supine Bridge - 2 x daily - 7 x weekly - 3 sets - 10 reps - 2 hold Clamshell - 1-2 x daily - 7 x weekly - 3 sets - 10 reps Supine Piriformis Stretch with Foot on Ground (Mirrored) - 2 x daily - 7 x weekly - 1 sets - 5 reps - 30 hold Supine Figure 4 Piriformis Stretch (Mirrored) - 2 x daily - 7 x weekly - 1 sets - 5 reps - 30 hold   ASSESSMENT:  CLINICAL IMPRESSION: 06/19/2021: Patient is a 76  y.o. who comes to clinic with complaints of low back pain, Lt hip pain with mobility, strength and movement coordination deficits that impair their ability to perform usual daily and recreational functional activities without increase difficulty/symptoms at this time.  Patient to benefit from skilled PT services to address impairments and limitations to improve to previous level of  function without restriction secondary to condition. Objective impairments include Abnormal gait, decreased activity tolerance, decreased balance, decreased coordination, decreased endurance, decreased mobility, difficulty walking, decreased ROM, decreased strength, hypomobility, impaired flexibility, improper body mechanics, postural dysfunction,  and pain. These impairments are limiting patient from cleaning, community activity, meal prep, laundry, and yard work. Personal factors including 3+ comorbidities: peripherial neuropathy, DM 2, hyperlipidemia  are also affecting patient's functional outcome. Patient will benefit from skilled PT to address above impairments and improve overall function.  REHAB POTENTIAL: Good  CLINICAL DECISION MAKING: Stable/uncomplicated  EVALUATION COMPLEXITY: Low   GOALS: Goals reviewed with patient? Yes  SHORT TERM GOALS:  STG Name Target Date Goal status  1 Patient will demonstrate independent use of home exercise program to maintain progress from in clinic treatments.  07/10/2021 INITIAL                                  LONG TERM GOALS:   LTG Name Target Date Goal status  1 Patient will demonstrate/report pain at worst less than or equal to 2/10 to facilitate minimal limitation in daily activity secondary to pain symptoms.  08/28/2021 INITIAL  2 Patient will demonstrate independent use of home exercise program to facilitate ability to maintain/progress functional gains from skilled physical therapy services.  08/28/2021 INITIAL  3 Patient will demonstrate FOTO outcome > or = 41% to indicated reduced disability due to condition. 08/28/2021 INITIAL  4 Patient will demonstrate lumbar extension > or = 75% WFL s symptoms to facilitate upright standing, walking posture at PLOF s limitation. 08/28/2021 INITIAL  5 Patient will demonstrate Lt knee MMT 5/5, bilateral hip abduction MMT > or = 4/5 to facilitate transfers, standing, walking at PLOF.   08/28/2021 INITIAL  6 Patient will demonstrate sit to stand to sit c good control no UE assist to facilitate improved mobility and functional level.   08/28/2021 INITIAL  7 Patient will demonstrate bilateral IR/ER hip AROM gains > or = 10 degrees to facilitate functional movement improvement.   08/28/2021 INITIAL   PLAN: PT FREQUENCY: 1-2x/week   PT DURATION:  10 weeks  PLANNED INTERVENTIONS: Therapeutic exercises, Therapeutic activity, Neuro Muscular re-education, Balance training, Gait training, Patient/Family education, Joint mobilization, Stair training, DME instructions, Dry Needling, Electrical stimulation, Cryotherapy, Moist heat, Taping, Ultrasound, Ionotophoresis 4mg /ml Dexamethasone, and Manual therapy  PLAN FOR NEXT SESSION: 06/19/2021: Review HEP, progressive hip/lumbar mobility gains, strengthening to tolerance.  Nustep for functional endurance improvements.    Scot Jun, PT, DPT, OCS, ATC 06/19/21  10:19 AM

## 2021-06-19 ENCOUNTER — Other Ambulatory Visit: Payer: Self-pay

## 2021-06-19 ENCOUNTER — Ambulatory Visit (INDEPENDENT_AMBULATORY_CARE_PROVIDER_SITE_OTHER): Payer: Medicare Other | Admitting: Rehabilitative and Restorative Service Providers"

## 2021-06-19 ENCOUNTER — Encounter: Payer: Self-pay | Admitting: Rehabilitative and Restorative Service Providers"

## 2021-06-19 DIAGNOSIS — M545 Low back pain, unspecified: Secondary | ICD-10-CM

## 2021-06-19 DIAGNOSIS — M25552 Pain in left hip: Secondary | ICD-10-CM | POA: Diagnosis not present

## 2021-06-19 DIAGNOSIS — M6281 Muscle weakness (generalized): Secondary | ICD-10-CM | POA: Diagnosis not present

## 2021-06-19 DIAGNOSIS — R262 Difficulty in walking, not elsewhere classified: Secondary | ICD-10-CM

## 2021-06-19 DIAGNOSIS — R293 Abnormal posture: Secondary | ICD-10-CM

## 2021-06-19 DIAGNOSIS — G8929 Other chronic pain: Secondary | ICD-10-CM

## 2021-06-19 MED ORDER — AMLODIPINE BESYLATE 5 MG PO TABS: ORAL_TABLET | ORAL | 3 refills | Status: DC

## 2021-06-24 ENCOUNTER — Encounter: Payer: Self-pay | Admitting: Physical Medicine and Rehabilitation

## 2021-06-24 ENCOUNTER — Ambulatory Visit: Payer: Self-pay

## 2021-06-24 ENCOUNTER — Other Ambulatory Visit: Payer: Self-pay

## 2021-06-24 ENCOUNTER — Ambulatory Visit: Payer: Medicare Other | Admitting: Physical Medicine and Rehabilitation

## 2021-06-24 VITALS — BP 149/76 | HR 58

## 2021-06-24 DIAGNOSIS — M5416 Radiculopathy, lumbar region: Secondary | ICD-10-CM | POA: Diagnosis not present

## 2021-06-24 MED ORDER — METHYLPREDNISOLONE ACETATE 80 MG/ML IJ SUSP
80.0000 mg | Freq: Once | INTRAMUSCULAR | Status: AC
  Administered 2021-06-24: 80 mg

## 2021-06-24 NOTE — Procedures (Signed)
Lumbosacral Transforaminal Epidural Steroid Injection - Sub-Pedicular Approach with Fluoroscopic Guidance  Patient: Angela Pugh      Date of Birth: 04-Apr-1946 MRN: [id number] PCP: [name], MD      Visit Date: 06/24/2021   Universal Protocol:    Date/Time: 06/24/2021  Consent Given By: the patient  Position: PRONE  Additional Comments: Vital signs were monitored before and after the procedure. Patient was prepped and draped in the usual sterile fashion. The correct patient, procedure, and site was verified.   Injection Procedure Details:   Procedure diagnoses: Lumbar radiculopathy [M54.16]    Meds Administered:  Meds ordered this encounter  Medications   methylPREDNISolone acetate (DEPO-MEDROL) injection 80 mg    Laterality: Left  Location/Site: L4  Needle:5.0 in., 22 ga.  Short bevel or Quincke spinal needle  Needle Placement: Transforaminal  Findings:    -Comments: Excellent flow of contrast along the nerve, nerve root and into the epidural space.  Procedure Details: After squaring off the end-plates to get a true AP view, the C-arm was positioned so that an oblique view of the foramen as noted above was visualized. The target area is just inferior to the "nose of the scotty dog" or sub pedicular. The soft tissues overlying this structure were infiltrated with 2-3 ml. of 1% Lidocaine without Epinephrine.  The spinal needle was inserted toward the target using a "trajectory" view along the fluoroscope beam.  Under AP and lateral visualization, the needle was advanced so it did not puncture dura and was located close the 6 O'Clock position of the pedical in AP tracterory. Biplanar projections were used to confirm position. Aspiration was confirmed to be negative for CSF and/or blood. A 1-2 ml. volume of Isovue-250 was injected and flow of contrast was noted at each level. Radiographs were obtained for documentation purposes.   After attaining the desired  flow of contrast documented above, a 0.5 to 1.0 ml test dose of 0.25% Marcaine was injected into each respective transforaminal space.  The patient was observed for 90 seconds post injection.  After no sensory deficits were reported, and normal lower extremity motor function was noted,   the above injectate was administered so that equal amounts of the injectate were placed at each foramen (level) into the transforaminal epidural space.   Additional Comments:  No complications occurred Dressing: 2 x 2 sterile gauze and Band-Aid    Post-procedure details: Patient was observed during the procedure. Post-procedure instructions were reviewed.  Patient left the clinic in stable condition.

## 2021-06-24 NOTE — Patient Instructions (Signed)

## 2021-06-24 NOTE — Progress Notes (Signed)
Pt state lower back pain that travels to her left hip and thigh. Pt state walking, standing and sitting makes the pain worse. Pt state she takes over the counter pain meds and pain cream to help ease her pain.  Numeric Pain Rating Scale and Functional Assessment Average Pain 8   In the last MONTH (on 0-10 scale) has pain interfered with the following?  1. General activity like being  able to carry out your everyday physical activities such as walking, climbing stairs, carrying groceries, or moving a chair?  Rating(10)   +Driver, -BT, -Dye Allergies.

## 2021-06-24 NOTE — Progress Notes (Signed)
Angela Pugh - 76 y.o. female MRN [id number]  Date of birth: 1945/06/15  Office Visit Note: Visit Date: 06/24/2021 PCP: [name], MD Referred by: [name], MD  Subjective: Chief Complaint  Patient presents with   Lower Back - Pain   Left Hip - Pain   Left Thigh - Pain   HPI:  Angela Pugh is a 76 y.o. female who comes in today at the request of Dr. Basil Dess for planned Left L4-5 Lumbar Transforaminal epidural steroid injection with fluoroscopic guidance.  The patient has failed conservative care including home exercise, medications, time and activity modification.  This injection will be diagnostic and hopefully therapeutic.  Please see requesting physician notes for further details and justification.  ROS Otherwise per HPI.  Assessment & Plan: Visit Diagnoses:    ICD-10-CM   1. Lumbar radiculopathy  M54.16 XR C-ARM NO REPORT    Epidural Steroid injection    methylPREDNISolone acetate (DEPO-MEDROL) injection 80 mg      Plan: No additional findings.   Meds & Orders:  Meds ordered this encounter  Medications   methylPREDNISolone acetate (DEPO-MEDROL) injection 80 mg    Orders Placed This Encounter  Procedures   XR C-ARM NO REPORT   Epidural Steroid injection    Follow-up: Return for Basil Dess, MD as scheduled.   Procedures: No procedures performed  Lumbosacral Transforaminal Epidural Steroid Injection - Sub-Pedicular Approach with Fluoroscopic Guidance  Patient: Angela Pugh      Date of Birth: February 06, 1946 MRN: [id number] PCP: [name], MD      Visit Date: 06/24/2021   Universal Protocol:    Date/Time: 06/24/2021  Consent Given By: the patient  Position: PRONE  Additional Comments: Vital signs were monitored before and after the procedure. Patient was prepped and draped in the usual sterile fashion. The correct patient, procedure, and site was verified.   Injection Procedure Details:   Procedure diagnoses:  Lumbar radiculopathy [M54.16]    Meds Administered:  Meds ordered this encounter  Medications   methylPREDNISolone acetate (DEPO-MEDROL) injection 80 mg    Laterality: Left  Location/Site: L4  Needle:5.0 in., 22 ga.  Short bevel or Quincke spinal needle  Needle Placement: Transforaminal  Findings:    -Comments: Excellent flow of contrast along the nerve, nerve root and into the epidural space.  Procedure Details: After squaring off the end-plates to get a true AP view, the C-arm was positioned so that an oblique view of the foramen as noted above was visualized. The target area is just inferior to the "nose of the scotty dog" or sub pedicular. The soft tissues overlying this structure were infiltrated with 2-3 ml. of 1% Lidocaine without Epinephrine.  The spinal needle was inserted toward the target using a "trajectory" view along the fluoroscope beam.  Under AP and lateral visualization, the needle was advanced so it did not puncture dura and was located close the 6 O'Clock position of the pedical in AP tracterory. Biplanar projections were used to confirm position. Aspiration was confirmed to be negative for CSF and/or blood. A 1-2 ml. volume of Isovue-250 was injected and flow of contrast was noted at each level. Radiographs were obtained for documentation purposes.   After attaining the desired flow of contrast documented above, a 0.5 to 1.0 ml test dose of 0.25% Marcaine was injected into each respective transforaminal space.  The patient was observed for 90 seconds post injection.  After no sensory deficits were reported, and normal lower  extremity motor function was noted,   the above injectate was administered so that equal amounts of the injectate were placed at each foramen (level) into the transforaminal epidural space.   Additional Comments:  No complications occurred Dressing: 2 x 2 sterile gauze and Band-Aid    Post-procedure details: Patient was observed during the  procedure. Post-procedure instructions were reviewed.  Patient left the clinic in stable condition.    Clinical History: No specialty comments available.     Objective:  VS:  HT:     WT:    BMI:      BP:(!) 149/76   HR:(!) 58bpm   TEMP: ( )   RESP:  Physical Exam Vitals and nursing note reviewed.  Constitutional:      General: She is not in acute distress.    Appearance: Normal appearance. She is not ill-appearing.  HENT:     Head: Normocephalic and atraumatic.     Right Ear: External ear normal.     Left Ear: External ear normal.  Eyes:     Extraocular Movements: Extraocular movements intact.  Cardiovascular:     Rate and Rhythm: Normal rate.     Pulses: Normal pulses.  Pulmonary:     Effort: Pulmonary effort is normal. No respiratory distress.  Abdominal:     General: There is no distension.     Palpations: Abdomen is soft.  Musculoskeletal:        General: Tenderness present.     Cervical back: Neck supple.     Right lower leg: No edema.     Left lower leg: No edema.     Comments: Patient has good distal strength with no pain over the greater trochanters.  No clonus or focal weakness.  Skin:    Findings: No erythema, lesion or rash.  Neurological:     General: No focal deficit present.     Mental Status: She is alert and oriented to person, place, and time.     Sensory: No sensory deficit.     Motor: No weakness or abnormal muscle tone.     Coordination: Coordination normal.  Psychiatric:        Mood and Affect: Mood normal.        Behavior: Behavior normal.     Imaging: No results found.

## 2021-06-26 ENCOUNTER — Other Ambulatory Visit: Payer: Self-pay

## 2021-06-26 ENCOUNTER — Ambulatory Visit (INDEPENDENT_AMBULATORY_CARE_PROVIDER_SITE_OTHER): Payer: Medicare Other | Admitting: Physical Therapy

## 2021-06-26 ENCOUNTER — Encounter: Payer: Self-pay | Admitting: Physical Therapy

## 2021-06-26 DIAGNOSIS — M6281 Muscle weakness (generalized): Secondary | ICD-10-CM

## 2021-06-26 DIAGNOSIS — R293 Abnormal posture: Secondary | ICD-10-CM

## 2021-06-26 DIAGNOSIS — G8929 Other chronic pain: Secondary | ICD-10-CM | POA: Diagnosis not present

## 2021-06-26 DIAGNOSIS — R262 Difficulty in walking, not elsewhere classified: Secondary | ICD-10-CM | POA: Diagnosis not present

## 2021-06-26 DIAGNOSIS — M25552 Pain in left hip: Secondary | ICD-10-CM | POA: Diagnosis not present

## 2021-06-26 DIAGNOSIS — M545 Low back pain, unspecified: Secondary | ICD-10-CM | POA: Diagnosis not present

## 2021-06-26 NOTE — Therapy (Signed)
OUTPATIENT PHYSICAL THERAPY TREATMENT NOTE   Patient Name: Angela Pugh MRN: [id number] DOB:07/03/1945, 76 y.o., female Today's Date: 06/26/2021  PCP: [name], MD REFERRING PROVIDER: Jessy Oto, MD   PT End of Session - 06/26/21 [phone]     Visit Number 2    Number of Visits 20    Date for PT Re-Evaluation 08/28/21    Authorization Type [employer] medicare, [employer]    Progress Note Due on Visit 10    PT Start Time 0845    PT Stop Time 0930    PT Time Calculation (min) 45 min    Activity Tolerance Patient tolerated treatment well    Behavior During Therapy [facility] for tasks assessed/performed             Past Medical History:  Diagnosis Date   Arthritis    back, knees, shoulder, hands , injections in pelvis, for bone spurs (05/04/2016)   Chronic back pain    Diverticulosis    History of kidney stones    HLD (hyperlipidemia)    Hypertension    Migraine    hx (05/04/2016)   Peripheral neuropathy    Radiculopathy    Sinus complaint    Sinus headache    occasional (05/04/2016)   Type II diabetes mellitus (HCC)    diet controlled, no meds now, was on insulin & po treatment at one time     Vertigo    Past Surgical History:  Procedure Laterality Date   ANTERIOR CERVICAL DECOMP/DISCECTOMY FUSION N/A 04/17/2016   Procedure: ANTERIOR CERVICAL DECOMPRESSION/DISCECTOMY FUSION CERVICAL THREE - CERVICAL FOUR, POSTERIOR CERVICAL DISCECTOMY CERVICAL SEVEN -THORASIC ONE LEFT;  Surgeon: Ashok Pall, MD;  Location: [place];  Service: Neurosurgery;  Laterality: N/A;  Anterior/Posterior   ANTERIOR FUSION CERVICAL SPINE  2000   Dr. Carloyn Manner; "for bone spurs; put hardware in too"   BACK SURGERY     COLONOSCOPY  06/10/2016   [facility]   LEFT HEART CATH AND CORONARY ANGIOGRAPHY N/A 03/17/2018   Procedure: LEFT HEART CATH AND CORONARY ANGIOGRAPHY;  Surgeon: Lorretta Harp, MD;  Location: [place] [facility];  Service: Cardiovascular;  Laterality: N/A;   LUMBAR LAMINECTOMY Left 06/30/2013    Procedure: Left L3-4 Extraforaminal approach to excise far lateral herniated nucleus pulposus;  Surgeon: Jessy Oto, MD;  Location: [place];  Service: Orthopedics;  Laterality: Left;   TUBAL LIGATION     VAGINAL HYSTERECTOMY     Patient Active Problem List   Diagnosis Date Noted   Headache above the eye region 02/19/2021   Nasal congestion 02/19/2021   Emphysema lung ([place]) 10/04/2020   Abdominal pain, epigastric 03/18/2020   Low back pain without sciatica 10/30/2019   Body mass index (BMI) 26.0-26.9, adult 10/23/2019   Nonischemic cardiomyopathy ([place]) 04/12/2018   Chest pain 01/25/2018   Left bundle branch block 01/25/2018   Carotid artery disease ([place]) 01/25/2018   Seasonal allergies 10/18/2017   Arthritis 10/18/2017   Pre-diabetes 10/18/2017   Protein-calorie malnutrition, severe 05/05/2016   Dysphagia 05/04/2016   HNP (herniated nucleus pulposus) with myelopathy, cervical 04/17/2016   Abdominal pain 02/01/2016   Acute diverticulitis 02/01/2016   HLD (hyperlipidemia) 02/01/2016   Diabetes mellitus without complication ([place]) 62/83/1517   Hypokalemia 02/01/2016   Hypertension    Cervical spondylosis without myelopathy 01/13/2016   Bilateral carpal tunnel syndrome 01/13/2016   Herniated lumbar intervertebral disc 06/30/2013    Class: Acute    REFERRING DIAG: M48.07 (ICD-10-CM) - Spinal stenosis of lumbosacral region  M43.16 (ICD-10-CM) - Spondylolisthesis, lumbar region M70.62 (ICD-10-CM) - Trochanteric bursitis, left hip M51.36 (ICD-10-CM) - Degenerative disc disease, lumbar  THERAPY DIAG:  Chronic bilateral low back pain without sciatica  Pain in left hip  Muscle weakness (generalized)  Abnormal posture  Difficulty in walking, not elsewhere classified   SUBJECTIVE: Pt. states that the injection she received has helped a lot. She stats she was able to do yard work yesterday with no pain. She did report having a headache this morning but that has subsided.   PAIN:   Are you having pain? Yes NPRS scale: currently: 8/10, at best 0/10 Pain location: back, Lt hip Pain orientation: lower back, Lt hip PAIN TYPE: chronic  Pain description: intermittent , achy, throbbing, stiff Aggravating factors: standing prolonged (10-15 mins), prolonged sitting, walking prolonged (sometimes due to back/hip, sometimes due to Lt knee giving way, picking/up lifting items, bending, household activity. Relieving factors: sitting rest, medication   PRECAUTIONS: None   WEIGHT BEARING RESTRICTIONS No    PATIENT GOALS Reduce pain to do normal life.      OBJECTIVE:    DIAGNOSTIC FINDINGS:  Lumbar spondylolisthesis, DDD L4-5.  MRI   PATIENT SURVEYS:  FOTO 06/19/2021  intake: 38 predicted: 44      MUSCLE LENGTH: 06/19/2021: Passive SLR Lt 65 degrees Passive SLR Rt 75 degrees    LUMBARAROM/PROM   A/PROM A  06/19/2021  Flexion Movement to mid shin c Lt lumbar/posterior Lt hip pain  Extension 25 % WFL c end range pain lumbar.  Repeated x 5 in standing produced similar end range pain.  Marked reduction in lumbar mobility (thoracolumbar junction hinge movement noted)  Right lateral flexion    Left lateral flexion    Right rotation    Left rotation     (Blank rows = not tested)   LE AROM/PROM:   A/PROM Right 06/19/2021 Left 06/19/2021  Hip flexion      Hip extension      Hip abduction      Hip adduction      Hip internal rotation PROM measured in supine 90 deg hip flexion: 25  PROM measured in supine 90 deg hip flexion:  30  Hip external rotation PROM measured in supine 90 deg hip flexion: 50 PROM measured in supine 90 deg hip flexion: 40  Knee flexion      Knee extension      Ankle dorsiflexion      Ankle plantarflexion      Ankle inversion      Ankle eversion       (Blank rows = not tested)   LE MMT:   MMT Right 06/19/2021 Left 06/19/2021  Hip flexion 5/5   4/5  Hip extension      Hip abduction 3+/5 2+/5 c pain  Hip adduction      Hip internal rotation       Hip external rotation      Knee flexion 5/5 5/5  Knee extension 5/5 4/5  Ankle dorsiflexion 5/5 5/5  Ankle plantarflexion      Ankle inversion      Ankle eversion       (Blank rows = not tested)   LUMBAR SPECIAL TESTS:  06/19/2021:(-) slump bilateral, (-) crossed slr bilateral for radicular symptoms produced.  + FABER Lt hip for pain   FUNCTIONAL TESTS:  06/19/2021: 18 inch chair s UE assist: increased difficulty but able to perform, decreased control upon lowering.     06/26/2021  Therapeutic Exercise:  Aerobic: Recumbent bike seat 7, lvl 3 x 8 minutes  Supine: Glute bridges x 10, Glute bridges with ball squeeze x 20, Lower trunk rotation x 15 each side, Piriformis stretch 5 sec x 10 each side Prone:  Seated: Green ball rollouts into lumbar flexion middle, right, and left 5 sec x 10 each direction; Hamstring stretch 5 x 5 sec. hold  Standing: Hip abduction and extension at countertop x 10 each side (attempted this with a red thera-band and pt. had increased pain, so thera-band was not used), Mini squats at counter top 2 x 10 with red band around knees, Hip flexor stretch standing at countertop 10 sec x 5 each side.  Neuromuscular Re-education: Manual Therapy: Therapeutic Activity: Self Care: Trigger Point Dry Needling:  Modalities:     TODAY'S TREATMENT  06/19/2021:  Therex:  HEP instruction/performance c cues for techniques, handout provided.  Trial set performed of each for comprehension and symptom assessment.  Additional time spent in review of techniques.  TherActivity: Cues and performance given for supine to sit to supine transfers to promote improved mechanics and reduced strain on hips/back.  Focus on log rolling technique for performance     PATIENT EDUCATION:  06/19/2021: Education details: HEP, POC Person educated: Patient Education method: Consulting civil engineer, Demonstration, Verbal cues, and Handouts Education comprehension: verbalized understanding and returned  demonstration     HOME EXERCISE PROGRAM: 06/19/2021: Access Code: [email or web address] URL: [email or web address] Date: 06/19/2021 Prepared by: Scot Jun   Exercises Supine Lower Trunk Rotation - 2 x daily - 7 x weekly - 1 sets - 5 reps - 15 hold Supine Bridge - 2 x daily - 7 x weekly - 3 sets - 10 reps - 2 hold Clamshell - 1-2 x daily - 7 x weekly - 3 sets - 10 reps Supine Piriformis Stretch with Foot on Ground (Mirrored) - 2 x daily - 7 x weekly - 1 sets - 5 reps - 30 hold Supine Figure 4 Piriformis Stretch (Mirrored) - 2 x daily - 7 x weekly - 1 sets - 5 reps - 30 hold     ASSESSMENT:   CLINICAL IMPRESSION: 06/26/2021:  Pt. HEP was updated today to progress exercises and add strengthening into program. Pt. tolerated today's session well, however the pt. was not able to use a resistance band during hip abduction and extension exercises in standing yet. This shows that these are weaker areas that need to be addressed during physical therapy. Pt. Reports being compliant to HEP and we will continue to progress exercises as tolerated.     REHAB POTENTIAL: Good   CLINICAL DECISION MAKING: Stable/uncomplicated   EVALUATION COMPLEXITY: Low     GOALS: Goals reviewed with patient? Yes   SHORT TERM GOALS:   STG Name Target Date Goal status  1 Patient will demonstrate independent use of home exercise program to maintain progress from in clinic treatments.  07/10/2021 INITIAL                                                            LONG TERM GOALS:    LTG Name Target Date Goal status  1 Patient will demonstrate/report pain at worst less than or equal to 2/10 to facilitate minimal limitation in daily activity secondary to pain symptoms.  08/28/2021 INITIAL  2 Patient will  demonstrate independent use of home exercise program to facilitate ability to maintain/progress functional gains from skilled physical therapy services.  08/28/2021 INITIAL  3 Patient will demonstrate FOTO  outcome > or = 41% to indicated reduced disability due to condition. 08/28/2021 INITIAL  4 Patient will demonstrate lumbar extension > or = 75% WFL s symptoms to facilitate upright standing, walking posture at PLOF s limitation. 08/28/2021 INITIAL  5 Patient will demonstrate Lt knee MMT 5/5, bilateral hip abduction MMT > or = 4/5 to facilitate transfers, standing, walking at PLOF.    08/28/2021 INITIAL  6 Patient will demonstrate sit to stand to sit c good control no UE assist to facilitate improved mobility and functional level.    08/28/2021 INITIAL  7 Patient will demonstrate bilateral IR/ER hip AROM gains > or = 10 degrees to facilitate functional movement improvement.    08/28/2021 INITIAL    PLAN: PT FREQUENCY: 1-2x/week     PT DURATION: 10 weeks   PLANNED INTERVENTIONS: Therapeutic exercises, Therapeutic activity, Neuro Muscular re-education, Balance training, Gait training, Patient/Family education, Joint mobilization, Stair training, DME instructions, Dry Needling, Electrical stimulation, Cryotherapy, Moist heat, Taping, Ultrasound, Ionotophoresis 4mg /ml Dexamethasone, and Manual therapy   PLAN FOR NEXT SESSION: 06/19/2021: Review additions to HEP, continue to progress lumbar/ hip strength and ROM exercises.

## 2021-06-30 DIAGNOSIS — H00025 Hordeolum internum left lower eyelid: Secondary | ICD-10-CM | POA: Diagnosis not present

## 2021-07-01 ENCOUNTER — Ambulatory Visit (INDEPENDENT_AMBULATORY_CARE_PROVIDER_SITE_OTHER): Payer: Medicare Other | Admitting: Rehabilitative and Restorative Service Providers"

## 2021-07-01 ENCOUNTER — Other Ambulatory Visit: Payer: Self-pay

## 2021-07-01 ENCOUNTER — Encounter: Payer: Self-pay | Admitting: Rehabilitative and Restorative Service Providers"

## 2021-07-01 DIAGNOSIS — R293 Abnormal posture: Secondary | ICD-10-CM | POA: Diagnosis not present

## 2021-07-01 DIAGNOSIS — G8929 Other chronic pain: Secondary | ICD-10-CM

## 2021-07-01 DIAGNOSIS — R262 Difficulty in walking, not elsewhere classified: Secondary | ICD-10-CM

## 2021-07-01 DIAGNOSIS — M6281 Muscle weakness (generalized): Secondary | ICD-10-CM | POA: Diagnosis not present

## 2021-07-01 DIAGNOSIS — M25552 Pain in left hip: Secondary | ICD-10-CM

## 2021-07-01 DIAGNOSIS — M545 Low back pain, unspecified: Secondary | ICD-10-CM

## 2021-07-01 NOTE — Therapy (Signed)
OUTPATIENT PHYSICAL THERAPY TREATMENT NOTE   Patient Name: Angela Pugh MRN: [id number] DOB:July 12, 1945, 76 y.o., female Today's Date: 07/01/2021  PCP: [name], MD REFERRING PROVIDER: Jessy Oto, MD   PT End of Session - 07/01/21 [phone]     Visit Number 3    Number of Visits 20    Date for PT Re-Evaluation 08/28/21    Authorization Type UHC medicare, A[employer]    Progress Note Due on Visit 10    PT Start Time 0800    PT Stop Time 0839    PT Time Calculation (min) 39 min    Activity Tolerance Patient limited by pain    Behavior During Therapy [facility] for tasks assessed/performed              Past Medical History:  Diagnosis Date   Arthritis    back, knees, shoulder, hands , injections in pelvis, for bone spurs (05/04/2016)   Chronic back pain    Diverticulosis    History of kidney stones    HLD (hyperlipidemia)    Hypertension    Migraine    hx (05/04/2016)   Peripheral neuropathy    Radiculopathy    Sinus complaint    Sinus headache    occasional (05/04/2016)   Type II diabetes mellitus (HCC)    diet controlled, no meds now, was on insulin & po treatment at one time     Vertigo    Past Surgical History:  Procedure Laterality Date   ANTERIOR CERVICAL DECOMP/DISCECTOMY FUSION N/A 04/17/2016   Procedure: ANTERIOR CERVICAL DECOMPRESSION/DISCECTOMY FUSION CERVICAL THREE - CERVICAL FOUR, POSTERIOR CERVICAL DISCECTOMY CERVICAL SEVEN -THORASIC ONE LEFT;  Surgeon: Ashok Pall, MD;  Location: [place];  Service: Neurosurgery;  Laterality: N/A;  Anterior/Posterior   ANTERIOR FUSION CERVICAL SPINE  2000   Dr. Carloyn Manner; "for bone spurs; put hardware in too"   BACK SURGERY     COLONOSCOPY  06/10/2016   [facility]   LEFT HEART CATH AND CORONARY ANGIOGRAPHY N/A 03/17/2018   Procedure: LEFT HEART CATH AND CORONARY ANGIOGRAPHY;  Surgeon: Lorretta Harp, MD;  Location: [place] CV [facility];  Service: Cardiovascular;  Laterality: N/A;   LUMBAR LAMINECTOMY Left 06/30/2013    Procedure: Left L3-4 Extraforaminal approach to excise far lateral herniated nucleus pulposus;  Surgeon: Jessy Oto, MD;  Location: [place];  Service: Orthopedics;  Laterality: Left;   TUBAL LIGATION     VAGINAL HYSTERECTOMY     Patient Active Problem List   Diagnosis Date Noted   Headache above the eye region 02/19/2021   Nasal congestion 02/19/2021   Emphysema lung ([place]) 10/04/2020   Abdominal pain, epigastric 03/18/2020   Low back pain without sciatica 10/30/2019   Body mass index (BMI) 26.0-26.9, adult 10/23/2019   Nonischemic cardiomyopathy ([place]) 04/12/2018   Chest pain 01/25/2018   Left bundle branch block 01/25/2018   Carotid artery disease ([place]) 01/25/2018   Seasonal allergies 10/18/2017   Arthritis 10/18/2017   Pre-diabetes 10/18/2017   Protein-calorie malnutrition, severe 05/05/2016   Dysphagia 05/04/2016   HNP (herniated nucleus pulposus) with myelopathy, cervical 04/17/2016   Abdominal pain 02/01/2016   Acute diverticulitis 02/01/2016   HLD (hyperlipidemia) 02/01/2016   Diabetes mellitus without complication ([employer]) 32/35/5732   Hypokalemia 02/01/2016   Hypertension    Cervical spondylosis without myelopathy 01/13/2016   Bilateral carpal tunnel syndrome 01/13/2016   Herniated lumbar intervertebral disc 06/30/2013    Class: Acute    REFERRING DIAG: M48.07 (ICD-10-CM) - Spinal stenosis of lumbosacral  region M43.16 (ICD-10-CM) - Spondylolisthesis, lumbar region M70.62 (ICD-10-CM) - Trochanteric bursitis, left hip M51.36 (ICD-10-CM) - Degenerative disc disease, lumbar  THERAPY DIAG:  Chronic bilateral low back pain without sciatica  Pain in left hip  Muscle weakness (generalized)  Abnormal posture  Difficulty in walking, not elsewhere classified   SUBJECTIVE: Patient reported leg complaints from hip to knee returning more over the weekend.  Also noted swelling in thigh that has reduced some in last day.  Felt like injection has worn off.   PAIN:  Are you  having pain? Yes NPRS scale: currently: 4-5/10 Pain location: Lt hip/thigh Pain orientation: Lt  PAIN TYPE: chronic  Pain description: intermittent , achy, throbbing, stiff Aggravating factors: walking, standing Relieving factors: rest   PRECAUTIONS: None   WEIGHT BEARING RESTRICTIONS No    PATIENT GOALS Reduce pain to do normal life.      OBJECTIVE:    DIAGNOSTIC FINDINGS:  Lumbar spondylolisthesis, DDD L4-5.  MRI   PATIENT SURVEYS:  FOTO 06/19/2021  intake: 38 predicted: 44      MUSCLE LENGTH: 06/19/2021: Passive SLR Lt 65 degrees Passive SLR Rt 75 degrees    LUMBARAROM/PROM   A/PROM A  06/19/2021  Flexion Movement to mid shin c Lt lumbar/posterior Lt hip pain  Extension 25 % WFL c end range pain lumbar.  Repeated x 5 in standing produced similar end range pain.  Marked reduction in lumbar mobility (thoracolumbar junction hinge movement noted)  Right lateral flexion    Left lateral flexion    Right rotation    Left rotation     (Blank rows = not tested)   LE AROM/PROM:   A/PROM Right 06/19/2021 Left 06/19/2021  Hip flexion      Hip extension      Hip abduction      Hip adduction      Hip internal rotation PROM measured in supine 90 deg hip flexion: 25  PROM measured in supine 90 deg hip flexion:  30  Hip external rotation PROM measured in supine 90 deg hip flexion: 50 PROM measured in supine 90 deg hip flexion: 40  Knee flexion      Knee extension      Ankle dorsiflexion      Ankle plantarflexion      Ankle inversion      Ankle eversion       (Blank rows = not tested)   LE MMT:   MMT Right 06/19/2021 Left 06/19/2021  Hip flexion 5/5   4/5  Hip extension      Hip abduction 3+/5 2+/5 c pain  Hip adduction      Hip internal rotation      Hip external rotation      Knee flexion 5/5 5/5  Knee extension 5/5 4/5  Ankle dorsiflexion 5/5 5/5  Ankle plantarflexion      Ankle inversion      Ankle eversion       (Blank rows = not tested)   LUMBAR SPECIAL  TESTS:  06/19/2021:(-) slump bilateral, (-) crossed slr bilateral for radicular symptoms produced.  + FABER Lt hip for pain   FUNCTIONAL TESTS:  06/19/2021: 18 inch chair s UE assist: increased difficulty but able to perform, decreased control upon lowering.   Ambulation/Gait  07/01/2021 : forward trunk lean, minimal Lt hip extension in stance, decreased stance on Lt      TODAY'S TREATMENT  07/01/2021  Therapeutic Exercise: Recumbent bike lvl 3 8 mins   Supine LTR 15  sec x 5 bilateral Supine bridge 2 x 10 Sidelying clam shell Lt hip movement 3 x 10  Supine piriformis figure 4 pull push away 30 sec x 3 bilateral   Manual Therapy:  STM, percussive device to Lt lateral hip/thigh (glute med, lateral quad)     06/26/2021  Therapeutic Exercise:  Aerobic: Recumbent bike seat 7, lvl 3 x 8 minutes  Supine: Glute bridges x 10, Glute bridges with ball squeeze x 20, Lower trunk rotation x 15 each side, Piriformis stretch 5 sec x 10 each side Prone:  Seated: Green ball rollouts into lumbar flexion middle, right, and left 5 sec x 10 each direction; Hamstring stretch 5 x 5 sec. hold  Standing: Hip abduction and extension at countertop x 10 each side (attempted this with a red thera-band and pt. had increased pain, so thera-band was not used), Mini squats at counter top 2 x 10 with red band around knees, Hip flexor stretch standing at countertop 10 sec x 5 each side.  Neuromuscular Re-education: Manual Therapy: Therapeutic Activity: Self Care: Trigger Point Dry Needling:  Modalities:      06/19/2021:  Therex:  HEP instruction/performance c cues for techniques, handout provided.  Trial set performed of each for comprehension and symptom assessment.  Additional time spent in review of techniques.  TherActivity: Cues and performance given for supine to sit to supine transfers to promote improved mechanics and reduced strain on hips/back.  Focus on log rolling technique for performance     PATIENT  EDUCATION:  06/19/2021: Education details: HEP, POC Person educated: Patient Education method: Consulting civil engineer, Demonstration, Verbal cues, and Handouts Education comprehension: verbalized understanding and returned demonstration     HOME EXERCISE PROGRAM: 06/19/2021: Access Code: [email or web address] URL: [email or web address] Date: 06/19/2021 Prepared by: Scot Jun   Exercises Supine Lower Trunk Rotation - 2 x daily - 7 x weekly - 1 sets - 5 reps - 15 hold Supine Bridge - 2 x daily - 7 x weekly - 3 sets - 10 reps - 2 hold Clamshell - 1-2 x daily - 7 x weekly - 3 sets - 10 reps Supine Piriformis Stretch with Foot on Ground (Mirrored) - 2 x daily - 7 x weekly - 1 sets - 5 reps - 30 hold Supine Figure 4 Piriformis Stretch (Mirrored) - 2 x daily - 7 x weekly - 1 sets - 5 reps - 30 hold     ASSESSMENT:   CLINICAL IMPRESSION: Pt appeared to have had return of symptoms, self indicating reduced benefit from injection noted in last few days.  Pt presented c antalgic gait (forward trunk lean, minimal Lt hip extension in stance, decreased stance on Lt).  Palpation revealed trigger points and tenderness throughout Lt glute med, lateral quad and connective tissue.  Future treatment may benefit to include percussive device, STM to improve mobility and reduce symptoms.     REHAB POTENTIAL: Good   CLINICAL DECISION MAKING: Stable/uncomplicated   EVALUATION COMPLEXITY: Low     GOALS: Goals reviewed with patient? Yes   SHORT TERM GOALS:   STG Name Target Date Goal status  1 Patient will demonstrate independent use of home exercise program to maintain progress from in clinic treatments.  07/10/2021 On going  Assessed 07/01/2021  LONG TERM GOALS:    LTG Name Target Date Goal status  1 Patient will demonstrate/report pain at worst less than or equal to 2/10 to facilitate minimal limitation in daily activity secondary to pain  symptoms.  08/28/2021 INITIAL  2 Patient will demonstrate independent use of home exercise program to facilitate ability to maintain/progress functional gains from skilled physical therapy services.  08/28/2021 INITIAL  3 Patient will demonstrate FOTO outcome > or = 41% to indicated reduced disability due to condition. 08/28/2021 INITIAL  4 Patient will demonstrate lumbar extension > or = 75% WFL s symptoms to facilitate upright standing, walking posture at PLOF s limitation. 08/28/2021 INITIAL  5 Patient will demonstrate Lt knee MMT 5/5, bilateral hip abduction MMT > or = 4/5 to facilitate transfers, standing, walking at PLOF.    08/28/2021 INITIAL  6 Patient will demonstrate sit to stand to sit c good control no UE assist to facilitate improved mobility and functional level.    08/28/2021 INITIAL  7 Patient will demonstrate bilateral IR/ER hip AROM gains > or = 10 degrees to facilitate functional movement improvement.    08/28/2021 INITIAL    PLAN: PT FREQUENCY: 1-2x/week     PT DURATION: 10 weeks   PLANNED INTERVENTIONS: Therapeutic exercises, Therapeutic activity, Neuro Muscular re-education, Balance training, Gait training, Patient/Family education, Joint mobilization, Stair training, DME instructions, Dry Needling, Electrical stimulation, Cryotherapy, Moist heat, Taping, Ultrasound, Ionotophoresis 4mg /ml Dexamethasone, and Manual therapy   PLAN FOR NEXT SESSION: Percussive device/STM to Lt lateral hip/thigh  Scot Jun, PT, DPT, OCS, ATC 07/01/21  8:42 AM

## 2021-07-02 ENCOUNTER — Telehealth: Payer: Self-pay | Admitting: Family Medicine

## 2021-07-02 MED ORDER — POTASSIUM CHLORIDE CRYS ER 20 MEQ PO TBCR: 20.0000 meq | EXTENDED_RELEASE_TABLET | Freq: Every day | ORAL | 1 refills | Status: DC

## 2021-07-02 NOTE — Telephone Encounter (Signed)
Rx sent in

## 2021-07-02 NOTE — Telephone Encounter (Signed)
Patient called to get refill on potassium chloride SA (KLOR-CON) 20 MEQ tablet   Please send to  [facility], [place] AT [place] Phone:  [phone]  Fax:  [phone]        Please advise

## 2021-07-03 ENCOUNTER — Other Ambulatory Visit: Payer: Self-pay

## 2021-07-03 ENCOUNTER — Ambulatory Visit (INDEPENDENT_AMBULATORY_CARE_PROVIDER_SITE_OTHER): Payer: Medicare Other | Admitting: Rehabilitative and Restorative Service Providers"

## 2021-07-03 ENCOUNTER — Encounter: Payer: Self-pay | Admitting: Rehabilitative and Restorative Service Providers"

## 2021-07-03 DIAGNOSIS — R262 Difficulty in walking, not elsewhere classified: Secondary | ICD-10-CM

## 2021-07-03 DIAGNOSIS — R293 Abnormal posture: Secondary | ICD-10-CM

## 2021-07-03 DIAGNOSIS — M545 Low back pain, unspecified: Secondary | ICD-10-CM | POA: Diagnosis not present

## 2021-07-03 DIAGNOSIS — G8929 Other chronic pain: Secondary | ICD-10-CM

## 2021-07-03 DIAGNOSIS — M6281 Muscle weakness (generalized): Secondary | ICD-10-CM

## 2021-07-03 DIAGNOSIS — M25552 Pain in left hip: Secondary | ICD-10-CM | POA: Diagnosis not present

## 2021-07-03 NOTE — Therapy (Signed)
OUTPATIENT PHYSICAL THERAPY TREATMENT NOTE   Patient Name: Angela Pugh MRN: [id number] DOB:December 23, 1945, 76 y.o., female Today's Date: 07/03/2021  PCP: [name], MD REFERRING PROVIDER: Jessy Oto, MD   PT End of Session - 07/03/21 0803     Visit Number 4    Number of Visits 20    Date for PT Re-Evaluation 08/28/21    Authorization Type UHC medicare, AARP    Progress Note Due on Visit 10    PT Start Time 0759    PT Stop Time 0839    PT Time Calculation (min) 40 min    Activity Tolerance Patient tolerated treatment well    Behavior During Therapy [facility] for tasks assessed/performed               Past Medical History:  Diagnosis Date   Arthritis    back, knees, shoulder, hands , injections in pelvis, for bone spurs (05/04/2016)   Chronic back pain    Diverticulosis    History of kidney stones    HLD (hyperlipidemia)    Hypertension    Migraine    hx (05/04/2016)   Peripheral neuropathy    Radiculopathy    Sinus complaint    Sinus headache    occasional (05/04/2016)   Type II diabetes mellitus (HCC)    diet controlled, no meds now, was on insulin & po treatment at one time     Vertigo    Past Surgical History:  Procedure Laterality Date   ANTERIOR CERVICAL DECOMP/DISCECTOMY FUSION N/A 04/17/2016   Procedure: ANTERIOR CERVICAL DECOMPRESSION/DISCECTOMY FUSION CERVICAL THREE - CERVICAL FOUR, POSTERIOR CERVICAL DISCECTOMY CERVICAL SEVEN -THORASIC ONE LEFT;  Surgeon: Ashok Pall, MD;  Location: [place];  Service: Neurosurgery;  Laterality: N/A;  Anterior/Posterior   ANTERIOR FUSION CERVICAL SPINE  2000   Dr. Carloyn Manner; "for bone spurs; put hardware in too"   BACK SURGERY     COLONOSCOPY  06/10/2016   [facility]   LEFT HEART CATH AND CORONARY ANGIOGRAPHY N/A 03/17/2018   Procedure: LEFT HEART CATH AND CORONARY ANGIOGRAPHY;  Surgeon: Lorretta Harp, MD;  Location: [place] [facility];  Service: Cardiovascular;  Laterality: N/A;   LUMBAR LAMINECTOMY Left 06/30/2013    Procedure: Left L3-4 Extraforaminal approach to excise far lateral herniated nucleus pulposus;  Surgeon: Jessy Oto, MD;  Location: [place];  Service: Orthopedics;  Laterality: Left;   TUBAL LIGATION     VAGINAL HYSTERECTOMY     Patient Active Problem List   Diagnosis Date Noted   Headache above the eye region 02/19/2021   Nasal congestion 02/19/2021   Emphysema lung ([place]) 10/04/2020   Abdominal pain, epigastric 03/18/2020   Low back pain without sciatica 10/30/2019   Body mass index (BMI) 26.0-26.9, adult 10/23/2019   Nonischemic cardiomyopathy ([place]) 04/12/2018   Chest pain 01/25/2018   Left bundle branch block 01/25/2018   Carotid artery disease ([place]) 01/25/2018   Seasonal allergies 10/18/2017   Arthritis 10/18/2017   Pre-diabetes 10/18/2017   Protein-calorie malnutrition, severe 05/05/2016   Dysphagia 05/04/2016   HNP (herniated nucleus pulposus) with myelopathy, cervical 04/17/2016   Abdominal pain 02/01/2016   Acute diverticulitis 02/01/2016   HLD (hyperlipidemia) 02/01/2016   Diabetes mellitus without complication ([place]) 69/48/5462   Hypokalemia 02/01/2016   Hypertension    Cervical spondylosis without myelopathy 01/13/2016   Bilateral carpal tunnel syndrome 01/13/2016   Herniated lumbar intervertebral disc 06/30/2013    Class: Acute    REFERRING DIAG: M48.07 (ICD-10-CM) - Spinal stenosis of  lumbosacral region M43.16 (ICD-10-CM) - Spondylolisthesis, lumbar region M70.62 (ICD-10-CM) - Trochanteric bursitis, left hip M51.36 (ICD-10-CM) - Degenerative disc disease, lumbar  THERAPY DIAG:  Chronic bilateral low back pain without sciatica  Pain in left hip  Muscle weakness (generalized)  Abnormal posture  Difficulty in walking, not elsewhere classified   SUBJECTIVE: Patient indicated improvements noted from last visit and tried to do the exercise as well at home.  Though percussive device was helpful.   PAIN:  Are you having pain? Yes NPRS scale: currently:  7/10 Pain location: Lt hip/thigh Pain orientation: Lt  PAIN TYPE: chronic  Pain description: intermittent , achy, throbbing, stiff Aggravating factors: walking, standing porlonged  Relieving factors: rest   PRECAUTIONS: None   WEIGHT BEARING RESTRICTIONS No    PATIENT GOALS Reduce pain to do normal life.      OBJECTIVE:    DIAGNOSTIC FINDINGS:  Lumbar spondylolisthesis, DDD L4-5.  MRI   PATIENT SURVEYS:  FOTO 06/19/2021  intake: 38 predicted: 44      MUSCLE LENGTH: 06/19/2021: Passive SLR Lt 65 degrees Passive SLR Rt 75 degrees    LUMBARAROM/PROM   A/PROM A  06/19/2021  Flexion Movement to mid shin c Lt lumbar/posterior Lt hip pain  Extension 25 % WFL c end range pain lumbar.  Repeated x 5 in standing produced similar end range pain.  Marked reduction in lumbar mobility (thoracolumbar junction hinge movement noted)  Right lateral flexion    Left lateral flexion    Right rotation    Left rotation     (Blank rows = not tested)   LE AROM/PROM:   A/PROM Right 06/19/2021 Left 06/19/2021  Hip flexion      Hip extension      Hip abduction      Hip adduction      Hip internal rotation PROM measured in supine 90 deg hip flexion: 25  PROM measured in supine 90 deg hip flexion:  30  Hip external rotation PROM measured in supine 90 deg hip flexion: 50 PROM measured in supine 90 deg hip flexion: 40  Knee flexion      Knee extension      Ankle dorsiflexion      Ankle plantarflexion      Ankle inversion      Ankle eversion       (Blank rows = not tested)   LE MMT:   MMT Right 06/19/2021 Left 06/19/2021 Right 07/03/2021 Left 07/03/2021  Hip flexion 5/5   4/5    Hip extension        Hip abduction 3+/5 2+/5 c pain  3+/5  Hip adduction        Hip internal rotation        Hip external rotation        Knee flexion 5/5 5/5    Knee extension 5/5 4/5    Ankle dorsiflexion 5/5 5/5    Ankle plantarflexion        Ankle inversion        Ankle eversion         (Blank rows = not  tested)   LUMBAR SPECIAL TESTS:  06/19/2021:(-) slump bilateral, (-) crossed slr bilateral for radicular symptoms produced.  + FABER Lt hip for pain   FUNCTIONAL TESTS:  06/19/2021: 18 inch chair s UE assist: increased difficulty but able to perform, decreased control upon lowering.   Ambulation/Gait  07/01/2021 : forward trunk lean, minimal Lt hip extension in stance, decreased stance on Lt  TODAY'S TREATMENT  07/03/2021  Therapeutic Exercise: Nustep Lvl 5 10 mins   Supine bridge 2 x 10 Sidelying clam shell, performed bilateral 3x 10  Sidelying reverse clam shell performed bilateral 3 x 10 Sit to stand from 18 inch chair s UE assist x 10    Manual Therapy:  STM, percussive device to Lt lateral hip/thigh (glute med, lateral quad)  07/01/2021  Therapeutic Exercise: Recumbent bike lvl 3 8 mins   Supine LTR 15 sec x 5 bilateral Supine bridge 2 x 10 Sidelying clam shell Lt hip movement 3 x 10  Supine piriformis figure 4 pull push away 30 sec x 3 bilateral   Manual Therapy:  STM, percussive device to Lt lateral hip/thigh (glute med, lateral quad)     06/26/2021  Therapeutic Exercise:  Aerobic: Recumbent bike seat 7, lvl 3 x 8 minutes  Supine: Glute bridges x 10, Glute bridges with ball squeeze x 20, Lower trunk rotation x 15 each side, Piriformis stretch 5 sec x 10 each side Prone:  Seated: Green ball rollouts into lumbar flexion middle, right, and left 5 sec x 10 each direction; Hamstring stretch 5 x 5 sec. hold  Standing: Hip abduction and extension at countertop x 10 each side (attempted this with a red thera-band and pt. had increased pain, so thera-band was not used), Mini squats at counter top 2 x 10 with red band around knees, Hip flexor stretch standing at countertop 10 sec x 5 each side.  Neuromuscular Re-education: Manual Therapy: Therapeutic Activity: Self Care: Trigger Point Dry Needling:  Modalities:      06/19/2021:  Therex:  HEP instruction/performance c  cues for techniques, handout provided.  Trial set performed of each for comprehension and symptom assessment.  Additional time spent in review of techniques.  TherActivity: Cues and performance given for supine to sit to supine transfers to promote improved mechanics and reduced strain on hips/back.  Focus on log rolling technique for performance     PATIENT EDUCATION:  06/19/2021: Education details: HEP, POC Person educated: Patient Education method: Consulting civil engineer, Demonstration, Verbal cues, and Handouts Education comprehension: verbalized understanding and returned demonstration     HOME EXERCISE PROGRAM: 06/19/2021: Access Code: [email or web address] URL: [email or web address] Date: 06/19/2021 Prepared by: Scot Jun   Exercises Supine Lower Trunk Rotation - 2 x daily - 7 x weekly - 1 sets - 5 reps - 15 hold Supine Bridge - 2 x daily - 7 x weekly - 3 sets - 10 reps - 2 hold Clamshell - 1-2 x daily - 7 x weekly - 3 sets - 10 reps Supine Piriformis Stretch with Foot on Ground (Mirrored) - 2 x daily - 7 x weekly - 1 sets - 5 reps - 30 hold Supine Figure 4 Piriformis Stretch (Mirrored) - 2 x daily - 7 x weekly - 1 sets - 5 reps - 30 hold     ASSESSMENT:   CLINICAL IMPRESSION: Noted reduction in palpation tenderness in Lt lateral hip/thigh today compared to last visit.  Pt. Demonstrated improvement in Lt hip abduction MMT compared to evaluation.  Pt to continue to benefit from skilled PT services for myofascial release for symptom reduction paired c strengthening/mobility gains.     REHAB POTENTIAL: Good   CLINICAL DECISION MAKING: Stable/uncomplicated   EVALUATION COMPLEXITY: Low     GOALS: Goals reviewed with patient? Yes   SHORT TERM GOALS:   STG Name Target Date Goal status  1 Patient will demonstrate independent use of home exercise program to maintain  progress from in clinic treatments.  07/10/2021 On going  Assessed 07/01/2021                                                             LONG TERM GOALS:    LTG Name Target Date Goal status  1 Patient will demonstrate/report pain at worst less than or equal to 2/10 to facilitate minimal limitation in daily activity secondary to pain symptoms.  08/28/2021 INITIAL  2 Patient will demonstrate independent use of home exercise program to facilitate ability to maintain/progress functional gains from skilled physical therapy services.  08/28/2021 INITIAL  3 Patient will demonstrate FOTO outcome > or = 41% to indicated reduced disability due to condition. 08/28/2021 INITIAL  4 Patient will demonstrate lumbar extension > or = 75% WFL s symptoms to facilitate upright standing, walking posture at PLOF s limitation. 08/28/2021 INITIAL  5 Patient will demonstrate Lt knee MMT 5/5, bilateral hip abduction MMT > or = 4/5 to facilitate transfers, standing, walking at PLOF.    08/28/2021 INITIAL  6 Patient will demonstrate sit to stand to sit c good control no UE assist to facilitate improved mobility and functional level.    08/28/2021 INITIAL  7 Patient will demonstrate bilateral IR/ER hip AROM gains > or = 10 degrees to facilitate functional movement improvement.    08/28/2021 INITIAL    PLAN: PT FREQUENCY: 1-2x/week     PT DURATION: 10 weeks   PLANNED INTERVENTIONS: Therapeutic exercises, Therapeutic activity, Neuro Muscular re-education, Balance training, Gait training, Patient/Family education, Joint mobilization, Stair training, DME instructions, Dry Needling, Electrical stimulation, Cryotherapy, Moist heat, Taping, Ultrasound, Ionotophoresis 4mg /ml Dexamethasone, and Manual therapy   PLAN FOR NEXT SESSION: Percussive device/STM to Lt lateral hip/thigh, progressive hip/LE strengthening  Scot Jun, PT, DPT, OCS, ATC 07/03/21  8:40 AM

## 2021-07-08 ENCOUNTER — Other Ambulatory Visit: Payer: Self-pay

## 2021-07-08 ENCOUNTER — Encounter: Payer: Self-pay | Admitting: Physical Therapy

## 2021-07-08 ENCOUNTER — Ambulatory Visit (INDEPENDENT_AMBULATORY_CARE_PROVIDER_SITE_OTHER): Payer: Medicare Other | Admitting: Physical Therapy

## 2021-07-08 DIAGNOSIS — M25552 Pain in left hip: Secondary | ICD-10-CM

## 2021-07-08 DIAGNOSIS — M545 Low back pain, unspecified: Secondary | ICD-10-CM | POA: Diagnosis not present

## 2021-07-08 DIAGNOSIS — R262 Difficulty in walking, not elsewhere classified: Secondary | ICD-10-CM

## 2021-07-08 DIAGNOSIS — M6281 Muscle weakness (generalized): Secondary | ICD-10-CM

## 2021-07-08 DIAGNOSIS — R293 Abnormal posture: Secondary | ICD-10-CM | POA: Diagnosis not present

## 2021-07-08 DIAGNOSIS — G8929 Other chronic pain: Secondary | ICD-10-CM | POA: Diagnosis not present

## 2021-07-08 NOTE — Therapy (Signed)
OUTPATIENT PHYSICAL THERAPY TREATMENT NOTE   Patient Name: Angela Pugh MRN: [id number] DOB:[birth year], 76 y.o., female Today's Date: 07/08/2021  PCP: [name], MD REFERRING PROVIDER: Jessy Oto, MD   PT End of Session - 07/08/21 1041     Visit Number 5    Number of Visits 20    Date for PT Re-Evaluation 08/28/21    Authorization Type [employer]HC medicare, [employer]    Progress Note Due on Visit 10    PT Start Time 0930    PT Stop Time 1015    PT Time Calculation (min) 45 min    Activity Tolerance Patient tolerated treatment well    Behavior During Therapy [facility] for tasks assessed/performed                Past Medical History:  Diagnosis Date   Arthritis    back, knees, shoulder, hands , injections in pelvis, for bone spurs (05/04/2016)   Chronic back pain    Diverticulosis    History of kidney stones    HLD (hyperlipidemia)    Hypertension    Migraine    hx (05/04/2016)   Peripheral neuropathy    Radiculopathy    Sinus complaint    Sinus headache    occasional (05/04/2016)   Type II diabetes mellitus (HCC)    diet controlled, no meds now, was on insulin & po treatment at one time     Vertigo    Past Surgical History:  Procedure Laterality Date   ANTERIOR CERVICAL DECOMP/DISCECTOMY FUSION N/A 04/17/2016   Procedure: ANTERIOR CERVICAL DECOMPRESSION/DISCECTOMY FUSION CERVICAL THREE - CERVICAL FOUR, POSTERIOR CERVICAL DISCECTOMY CERVICAL SEVEN -THORASIC ONE LEFT;  Surgeon: Ashok Pall, MD;  Location: [facility];  Service: Neurosurgery;  Laterality: N/A;  Anterior/Posterior   ANTERIOR FUSION CERVICAL SPINE  2000   Dr. Carloyn Manner; "for bone spurs; put hardware in too"   BACK SURGERY     COLONOSCOPY  06/10/2016   [facility]   LEFT HEART CATH AND CORONARY ANGIOGRAPHY N/A 03/17/2018   Procedure: LEFT HEART CATH AND CORONARY ANGIOGRAPHY;  Surgeon: Lorretta Harp, MD;  Location: [facility];  Service: Cardiovascular;  Laterality: N/A;   LUMBAR LAMINECTOMY Left  06/30/2013   Procedure: Left L3-4 Extraforaminal approach to excise far lateral herniated nucleus pulposus;  Surgeon: Jessy Oto, MD;  Location: [facility];  Service: Orthopedics;  Laterality: Left;   TUBAL LIGATION     VAGINAL HYSTERECTOMY     Patient Active Problem List   Diagnosis Date Noted   Headache above the eye region 02/19/2021   Nasal congestion 02/19/2021   Emphysema lung ([place]) 10/04/2020   Abdominal pain, epigastric 03/18/2020   Low back pain without sciatica 10/30/2019   Body mass index (BMI) 26.0-26.9, adult 10/23/2019   Nonischemic cardiomyopathy ([place]) 04/12/2018   Chest pain 01/25/2018   Left bundle branch block 01/25/2018   Carotid artery disease ([place]) 01/25/2018   Seasonal allergies 10/18/2017   Arthritis 10/18/2017   Pre-diabetes 10/18/2017   Protein-calorie malnutrition, severe 05/05/2016   Dysphagia 05/04/2016   HNP (herniated nucleus pulposus) with myelopathy, cervical 04/17/2016   Abdominal pain 02/01/2016   Acute diverticulitis 02/01/2016   HLD (hyperlipidemia) 02/01/2016   Diabetes mellitus without complication ([place]) 86/76/1950   Hypokalemia 02/01/2016   Hypertension    Cervical spondylosis without myelopathy 01/13/2016   Bilateral carpal tunnel syndrome 01/13/2016   Herniated lumbar intervertebral disc 06/30/2013    Class: Acute    REFERRING DIAG: M48.07 (ICD-10-CM) - Spinal stenosis  of lumbosacral region M43.16 (ICD-10-CM) - Spondylolisthesis, lumbar region M70.62 (ICD-10-CM) - Trochanteric bursitis, left hip M51.36 (ICD-10-CM) - Degenerative disc disease, lumbar  THERAPY DIAG:  Chronic bilateral low back pain without sciatica  Pain in left hip  Muscle weakness (generalized)  Abnormal posture  Difficulty in walking, not elsewhere classified   SUBJECTIVE: Pt states that the low back and L hip pain is a 5/10 today. She states that the massage gun used at the last visit was very helpful. Pt states she is noticing improvements in LE strength.    PAIN:  Are you having pain? Yes NPRS scale: currently: 5/10 Pain location: Lt hip/thigh Pain orientation: Lt  PAIN TYPE: chronic  Pain description: intermittent , achy, throbbing, stiff Aggravating factors: walking, standing porlonged  Relieving factors: rest   PRECAUTIONS: None   WEIGHT BEARING RESTRICTIONS No    PATIENT GOALS Reduce pain to do normal life.      OBJECTIVE:    DIAGNOSTIC FINDINGS:  Lumbar spondylolisthesis, DDD L4-5.  MRI   PATIENT SURVEYS:  FOTO 06/19/2021  intake: 38 predicted: 44      MUSCLE LENGTH: 06/19/2021: Passive SLR Lt 65 degrees Passive SLR Rt 75 degrees    LUMBARAROM/PROM   A/PROM A  06/19/2021  Flexion Movement to mid shin c Lt lumbar/posterior Lt hip pain  Extension 25 % WFL c end range pain lumbar.  Repeated x 5 in standing produced similar end range pain.  Marked reduction in lumbar mobility (thoracolumbar junction hinge movement noted)  Right lateral flexion    Left lateral flexion    Right rotation    Left rotation     (Blank rows = not tested)   LE AROM/PROM:   A/PROM Right 06/19/2021 Left 06/19/2021  Hip flexion      Hip extension      Hip abduction      Hip adduction      Hip internal rotation PROM measured in supine 90 deg hip flexion: 25  PROM measured in supine 90 deg hip flexion:  30  Hip external rotation PROM measured in supine 90 deg hip flexion: 50 PROM measured in supine 90 deg hip flexion: 40  Knee flexion      Knee extension      Ankle dorsiflexion      Ankle plantarflexion      Ankle inversion      Ankle eversion       (Blank rows = not tested)   LE MMT:   MMT Right 06/19/2021 Left 06/19/2021 Right 07/03/2021 Left 07/03/2021  Hip flexion 5/5   4/5    Hip extension        Hip abduction 3+/5 2+/5 c pain  3+/5  Hip adduction        Hip internal rotation        Hip external rotation        Knee flexion 5/5 5/5    Knee extension 5/5 4/5    Ankle dorsiflexion 5/5 5/5    Ankle plantarflexion        Ankle  inversion        Ankle eversion         (Blank rows = not tested)   LUMBAR SPECIAL TESTS:  06/19/2021:(-) slump bilateral, (-) crossed slr bilateral for radicular symptoms produced.  + FABER Lt hip for pain   FUNCTIONAL TESTS:  06/19/2021: 18 inch chair s UE assist: increased difficulty but able to perform, decreased control upon lowering.   Ambulation/Gait  07/01/2021 :  forward trunk lean, minimal Lt hip extension in stance, decreased stance on Lt      TODAY'S TREATMENT  07/08/2021   Therapeutic Exercise:   Aerobic: Nustep Lvl 5.0 x 8 minutes Supine: Supine bridges with ball squeeze 2 x 10 3 second hold, bilat LE on pball rolling into hip flexion x 15 10 sec. hold Prone:  Seated: Green ball rollouts into lumbar flexion middle, right and left x 10 each way  Standing: Standing at countertop abduction and extension 2# bilat 2 x 10, Hamstring curls 2# x 10 bilat, Standing marches at [employer] 2# 2 x 10 bilat  Neuromuscular Re-education: Manual Therapy: percussive device to Lt/ Rt lateral hip/thigh (glutes, IT band, hamstrings)  Therapeutic Activity: Self Care: Trigger Point Dry Needling:  Modalities:    07/03/2021  Therapeutic Exercise: Nustep Lvl 5 10 mins   Supine bridge 2 x 10 Sidelying clam shell, performed bilateral 3x 10  Sidelying reverse clam shell performed bilateral 3 x 10 Sit to stand from 18 inch chair s UE assist x 10    Manual Therapy:  STM, percussive device to Lt lateral hip/thigh (glute med, lateral quad)      PATIENT EDUCATION:  06/19/2021: Education details: HEP, POC Person educated: Patient Education method: Consulting civil engineer, Media planner, Verbal cues, and Handouts Education comprehension: verbalized understanding and returned demonstration     HOME EXERCISE PROGRAM: 06/19/2021: Access Code: [email or web address] URL: [email or web address] Date: 06/19/2021 Prepared by: Scot Jun   Exercises Supine Lower Trunk Rotation - 2 x daily - 7 x weekly - 1  sets - 5 reps - 15 hold Supine Bridge - 2 x daily - 7 x weekly - 3 sets - 10 reps - 2 hold Clamshell - 1-2 x daily - 7 x weekly - 3 sets - 10 reps Supine Piriformis Stretch with Foot on Ground (Mirrored) - 2 x daily - 7 x weekly - 1 sets - 5 reps - 30 hold Supine Figure 4 Piriformis Stretch (Mirrored) - 2 x daily - 7 x weekly - 1 sets - 5 reps - 30 hold     ASSESSMENT:   CLINICAL IMPRESSION: Session focused on Lt hip strengthening and lumbar spine flexion exercises to assist with decreasing pain from spinal anterolisthesis and stenosis. Pt tolerated today's session well and is showing improvement in hip strength as seen with using ankle weights with hip exercises today. Percussion instrument was utilized to help decrease muscle tightness in bilat LE's.     REHAB POTENTIAL: Good   CLINICAL DECISION MAKING: Stable/uncomplicated   EVALUATION COMPLEXITY: Low     GOALS: Goals reviewed with patient? Yes   SHORT TERM GOALS:   STG Name Target Date Goal status  1 Patient will demonstrate independent use of home exercise program to maintain progress from in clinic treatments.  07/10/2021 On going  Assessed 07/01/2021                                                            LONG TERM GOALS:    LTG Name Target Date Goal status  1 Patient will demonstrate/report pain at worst less than or equal to 2/10 to facilitate minimal limitation in daily activity secondary to pain symptoms.  08/28/2021 On- going  2 Patient will demonstrate independent use of home exercise program to facilitate ability to maintain/progress  functional gains from skilled physical therapy services.  08/28/2021 On- going  3 Patient will demonstrate FOTO outcome > or = 41% to indicated reduced disability due to condition. 08/28/2021 On- going  4 Patient will demonstrate lumbar extension > or = 75% WFL s symptoms to facilitate upright standing, walking posture at PLOF s limitation. 08/28/2021 On- going  5 Patient will  demonstrate Lt knee MMT 5/5, bilateral hip abduction MMT > or = 4/5 to facilitate transfers, standing, walking at PLOF.    08/28/2021 On- going  6 Patient will demonstrate sit to stand to sit c good control no UE assist to facilitate improved mobility and functional level.    08/28/2021 On- going  7 Patient will demonstrate bilateral IR/ER hip AROM gains > or = 10 degrees to facilitate functional movement improvement.    08/28/2021 On- going    PLAN: PT FREQUENCY: 1-2x/week     PT DURATION: 10 weeks   PLANNED INTERVENTIONS: Therapeutic exercises, Therapeutic activity, Neuro Muscular re-education, Balance training, Gait training, Patient/Family education, Joint mobilization, Stair training, DME instructions, Dry Needling, Electrical stimulation, Cryotherapy, Moist heat, Taping, Ultrasound, Ionotophoresis 4mg /ml Dexamethasone, and Manual therapy   PLAN FOR NEXT SESSION: Percussive device/STM to Lt lateral hip/thigh, increase ankle weight with hip strengthening exercises as tolerated   Jamielee Mchale Singer, SPT 79/72/82  10:54 AM

## 2021-07-09 ENCOUNTER — Encounter: Payer: Self-pay | Admitting: Specialist

## 2021-07-09 ENCOUNTER — Ambulatory Visit: Payer: Medicare Other | Admitting: Specialist

## 2021-07-09 VITALS — BP 151/88 | HR 58 | Ht 66.0 in | Wt 167.0 lb

## 2021-07-09 DIAGNOSIS — M4316 Spondylolisthesis, lumbar region: Secondary | ICD-10-CM | POA: Diagnosis not present

## 2021-07-09 DIAGNOSIS — M5136 Other intervertebral disc degeneration, lumbar region: Secondary | ICD-10-CM

## 2021-07-09 DIAGNOSIS — M542 Cervicalgia: Secondary | ICD-10-CM | POA: Diagnosis not present

## 2021-07-09 DIAGNOSIS — M7062 Trochanteric bursitis, left hip: Secondary | ICD-10-CM | POA: Diagnosis not present

## 2021-07-09 NOTE — Progress Notes (Signed)
Office Visit Note   Patient: Angela Pugh           Date of Birth: 1946-05-13           MRN: [id number] Visit Date: 07/09/2021              Requested by: Angela Ruddy, MD [place],  [place] [address] PCP: Angela Ruddy, MD   Assessment & Plan: Visit Diagnoses:  1. Spondylolisthesis, lumbar region   2. Trochanteric bursitis, left hip   3. Degenerative disc disease, lumbar   4. Cervicalgia     Plan: Pain with lying on the left side. Numbness and tingling into the left lateral thigh and calf and into the left great toe. Tender over the left greater trochanter Previous injection in 03/2020 left greater trochanter with good relief.   Avoid bending, stooping and avoid lifting weights greater than 10 lbs. Avoid prolong standing and walking. Avoid frequent bending and stooping  No lifting greater than 10 lbs. May use ice or moist heat for pain. Weight loss is of benefit. Handicap license is approved. PT for the lumbar spine ordered  Follow-Up Instructions: No follow-ups on file.   Orders:  No orders of the defined types were placed in this encounter.  No orders of the defined types were placed in this encounter.     Procedures: No procedures performed   Clinical Data: No additional findings.   Subjective: Chief Complaint  Patient presents with   Lower Back - Follow-up    Had a Left L4 TF injection with Dr. [name] on 06/24/21, she got 100% relief from it, she states that she is doing Physical Therapy and it has really helped her as well.    76 year old female with history of lumbago and left leg weakness and symptoms of claudication left leg and upper extremity shoulder weakness. She is pleased thus far with the results of PT and left L4 TF ESI done 06/24/21. She is able to do more now than she has in some time and wishes to continue a conservative exercise program and plans to return to the [facility] for exercise program. No bowel or  bladder difficulty.   Review of Systems  Constitutional: Negative.   HENT: Negative.    Eyes: Negative.   Respiratory: Negative.    Cardiovascular: Negative.   Gastrointestinal: Negative.   Endocrine: Negative.   Genitourinary: Negative.   Musculoskeletal: Negative.   Skin: Negative.   Allergic/Immunologic: Negative.   Neurological: Negative.   Hematological: Negative.   Psychiatric/Behavioral: Negative.      Objective: Vital Signs: BP (!) 151/88 (BP Location: Left Arm, Patient Position: Sitting)    Pulse (!) 58    Ht 5\' 6"  (1.676 m)    Wt 167 lb (75.8 kg)    BMI 26.95 kg/m   Physical Exam Constitutional:      Appearance: She is well-developed.  HENT:     Head: Normocephalic and atraumatic.  Eyes:     Pupils: Pupils are equal, round, and reactive to light.  Pulmonary:     Effort: Pulmonary effort is normal.     Breath sounds: Normal breath sounds.  Abdominal:     General: Bowel sounds are normal.     Palpations: Abdomen is soft.  Musculoskeletal:     Cervical back: Normal range of motion and neck supple.     Lumbar back: Negative right straight leg raise test and negative left straight leg raise test.  Skin:    General: Skin is warm and dry.  Neurological:     Mental Status: She is alert and oriented to person, place, and time.  Psychiatric:        Behavior: Behavior normal.        Thought Content: Thought content normal.        Judgment: Judgment normal.   Back Exam   Tenderness  The patient is experiencing tenderness in the lumbar and cervical.  Range of Motion  Extension:  70 abnormal  Flexion:  80 abnormal  Lateral bend right:  70  Lateral bend left:  70  Rotation right:  70  Rotation left:  70   Muscle Strength  Right Quadriceps:  5/5  Left Quadriceps:  5/5  Right Hamstrings:  5/5  Left Hamstrings:  5/5   Tests  Straight leg raise right: negative Straight leg raise left: negative  Reflexes  Patellar:  0/4 Achilles:  0/4  Other  Toe  walk: normal Heel walk: normal Sensation: normal Gait: normal   Comments:  Left hip flexion is 5-/5 much improved.     Specialty Comments:  No specialty comments available.  Imaging: No results found.   PMFS History: Patient Active Problem List   Diagnosis Date Noted   Herniated lumbar intervertebral disc 06/30/2013    Priority: High    Class: Acute   Headache above the eye region 02/19/2021   Nasal congestion 02/19/2021   Emphysema lung ([place]) 10/04/2020   Abdominal pain, epigastric 03/18/2020   Low back pain without sciatica 10/30/2019   Body mass index (BMI) 26.0-26.9, adult 10/23/2019   Nonischemic cardiomyopathy ([place]) 04/12/2018   Chest pain 01/25/2018   Left bundle branch block 01/25/2018   Carotid artery disease ([place]) 01/25/2018   Seasonal allergies 10/18/2017   Arthritis 10/18/2017   Pre-diabetes 10/18/2017   Protein-calorie malnutrition, severe 05/05/2016   Dysphagia 05/04/2016   HNP (herniated nucleus pulposus) with myelopathy, cervical 04/17/2016   Abdominal pain 02/01/2016   Acute diverticulitis 02/01/2016   HLD (hyperlipidemia) 02/01/2016   Diabetes mellitus without complication ([place]) 96/28/3662   Hypokalemia 02/01/2016   Hypertension    Cervical spondylosis without myelopathy 01/13/2016   Bilateral carpal tunnel syndrome 01/13/2016   Past Medical History:  Diagnosis Date   Arthritis    back, knees, shoulder, hands , injections in pelvis, for bone spurs (05/04/2016)   Chronic back pain    Diverticulosis    History of kidney stones    HLD (hyperlipidemia)    Hypertension    Migraine    hx (05/04/2016)   Peripheral neuropathy    Radiculopathy    Sinus complaint    Sinus headache    occasional (05/04/2016)   Type II diabetes mellitus (HCC)    diet controlled, no meds now, was on insulin & po treatment at one time     Vertigo     Family History  Problem Relation Age of Onset   Uterine cancer Mother    Diabetes type II Sister    Diabetes  Mellitus II Brother    Colon cancer Neg Hx    Pancreatic cancer Neg Hx    Esophageal cancer Neg Hx    Colon polyps Neg Hx    Rectal cancer Neg Hx    Stomach cancer Neg Hx     Past Surgical History:  Procedure Laterality Date   ANTERIOR CERVICAL DECOMP/DISCECTOMY FUSION N/A 04/17/2016   Procedure: ANTERIOR CERVICAL DECOMPRESSION/DISCECTOMY FUSION CERVICAL THREE - CERVICAL FOUR, POSTERIOR CERVICAL DISCECTOMY CERVICAL  SEVEN -THORASIC ONE LEFT;  Surgeon: Ashok Pall, MD;  Location: [facility];  Service: Neurosurgery;  Laterality: N/A;  Anterior/Posterior   ANTERIOR FUSION CERVICAL SPINE  2000   Dr. Carloyn Manner; "for bone spurs; put hardware in too"   BACK SURGERY     COLONOSCOPY  06/10/2016   [facility]   LEFT HEART CATH AND CORONARY ANGIOGRAPHY N/A 03/17/2018   Procedure: LEFT HEART CATH AND CORONARY ANGIOGRAPHY;  Surgeon: Lorretta Harp, MD;  Location: [facility];  Service: Cardiovascular;  Laterality: N/A;   LUMBAR LAMINECTOMY Left 06/30/2013   Procedure: Left L3-4 Extraforaminal approach to excise far lateral herniated nucleus pulposus;  Surgeon: Jessy Oto, MD;  Location: [facility];  Service: Orthopedics;  Laterality: Left;   TUBAL LIGATION     VAGINAL HYSTERECTOMY     Social History   Occupational History   Not on file  Tobacco Use   Smoking status: Some Days    Packs/day: 0.50    Years: 20.00    Pack years: 10.00    Types: Cigarettes    Last attempt to quit: 06/30/1982    Years since quitting: 39.0   Smokeless tobacco: Never   Tobacco comments:    Patient only smokes in social setting when drinking. Admits former h/o heavy smoking years ago smoked 1.5 packs/day.  Vaping Use   Vaping Use: Never used  Substance and Sexual Activity   Alcohol use: Not Currently    Comment: 05/04/2016 "might have a drink a couple days/year; might not"   Drug use: No   Sexual activity: Not Currently

## 2021-07-09 NOTE — Patient Instructions (Signed)
°  Plan: Pain with lying on the left side. Numbness and tingling into the left lateral thigh and calf and into the left great toe. Tender over the left greater trochanter Previous injection in 03/2020 left greater trochanter with good relief.   Avoid bending, stooping and avoid lifting weights greater than 10 lbs. Avoid prolong standing and walking. Avoid frequent bending and stooping  No lifting greater than 10 lbs. May use ice or moist heat for pain. Weight loss is of benefit. Handicap license is approved. PT for the lumbar spine ordered

## 2021-07-11 ENCOUNTER — Ambulatory Visit (INDEPENDENT_AMBULATORY_CARE_PROVIDER_SITE_OTHER): Payer: Medicare Other | Admitting: Physical Therapy

## 2021-07-11 ENCOUNTER — Other Ambulatory Visit: Payer: Self-pay

## 2021-07-11 ENCOUNTER — Encounter: Payer: Self-pay | Admitting: Physical Therapy

## 2021-07-11 DIAGNOSIS — R293 Abnormal posture: Secondary | ICD-10-CM

## 2021-07-11 DIAGNOSIS — G8929 Other chronic pain: Secondary | ICD-10-CM | POA: Diagnosis not present

## 2021-07-11 DIAGNOSIS — R262 Difficulty in walking, not elsewhere classified: Secondary | ICD-10-CM | POA: Diagnosis not present

## 2021-07-11 DIAGNOSIS — M545 Low back pain, unspecified: Secondary | ICD-10-CM | POA: Diagnosis not present

## 2021-07-11 DIAGNOSIS — M6281 Muscle weakness (generalized): Secondary | ICD-10-CM | POA: Diagnosis not present

## 2021-07-11 DIAGNOSIS — M25552 Pain in left hip: Secondary | ICD-10-CM | POA: Diagnosis not present

## 2021-07-11 NOTE — Therapy (Signed)
OUTPATIENT PHYSICAL THERAPY TREATMENT NOTE   Patient Name: Angela Pugh MRN: [id number] DOB:13-Aug-1945, 76 y.o., female Today's Date: 07/11/2021  PCP: [name], MD REFERRING PROVIDER: Jessy Oto, MD   PT End of Session - 07/11/21 1025     Visit Number 6    Number of Visits 20    Date for PT Re-Evaluation 08/28/21    Authorization Type UHC medicare, [employer]    Progress Note Due on Visit 10    PT Start Time 0800    PT Stop Time 0845    PT Time Calculation (min) 45 min    Activity Tolerance Patient tolerated treatment well    Behavior During Therapy [facility] for tasks assessed/performed                 Past Medical History:  Diagnosis Date   Arthritis    back, knees, shoulder, hands , injections in pelvis, for bone spurs (05/04/2016)   Chronic back pain    Diverticulosis    History of kidney stones    HLD (hyperlipidemia)    Hypertension    Migraine    hx (05/04/2016)   Peripheral neuropathy    Radiculopathy    Sinus complaint    Sinus headache    occasional (05/04/2016)   Type II diabetes mellitus (HCC)    diet controlled, no meds now, was on insulin & po treatment at one time     Vertigo    Past Surgical History:  Procedure Laterality Date   ANTERIOR CERVICAL DECOMP/DISCECTOMY FUSION N/A 04/17/2016   Procedure: ANTERIOR CERVICAL DECOMPRESSION/DISCECTOMY FUSION CERVICAL THREE - CERVICAL FOUR, POSTERIOR CERVICAL DISCECTOMY CERVICAL SEVEN -THORASIC ONE LEFT;  Surgeon: Ashok Pall, MD;  Location: [place];  Service: Neurosurgery;  Laterality: N/A;  Anterior/Posterior   ANTERIOR FUSION CERVICAL SPINE  2000   Dr. Carloyn Manner; "for bone spurs; put hardware in too"   BACK SURGERY     COLONOSCOPY  06/10/2016   [facility]   LEFT HEART CATH AND CORONARY ANGIOGRAPHY N/A 03/17/2018   Procedure: LEFT HEART CATH AND CORONARY ANGIOGRAPHY;  Surgeon: Lorretta Harp, MD;  Location: [place] [facility];  Service: Cardiovascular;  Laterality: N/A;   LUMBAR LAMINECTOMY Left  06/30/2013   Procedure: Left L3-4 Extraforaminal approach to excise far lateral herniated nucleus pulposus;  Surgeon: Jessy Oto, MD;  Location: [place];  Service: Orthopedics;  Laterality: Left;   TUBAL LIGATION     VAGINAL HYSTERECTOMY     Patient Active Problem List   Diagnosis Date Noted   Headache above the eye region 02/19/2021   Nasal congestion 02/19/2021   Emphysema lung ([place]) 10/04/2020   Abdominal pain, epigastric 03/18/2020   Low back pain without sciatica 10/30/2019   Body mass index (BMI) 26.0-26.9, adult 10/23/2019   Nonischemic cardiomyopathy ([place]) 04/12/2018   Chest pain 01/25/2018   Left bundle branch block 01/25/2018   Carotid artery disease ([place]) 01/25/2018   Seasonal allergies 10/18/2017   Arthritis 10/18/2017   Pre-diabetes 10/18/2017   Protein-calorie malnutrition, severe 05/05/2016   Dysphagia 05/04/2016   HNP (herniated nucleus pulposus) with myelopathy, cervical 04/17/2016   Abdominal pain 02/01/2016   Acute diverticulitis 02/01/2016   HLD (hyperlipidemia) 02/01/2016   Diabetes mellitus without complication ([place]) 93/23/5573   Hypokalemia 02/01/2016   Hypertension    Cervical spondylosis without myelopathy 01/13/2016   Bilateral carpal tunnel syndrome 01/13/2016   Herniated lumbar intervertebral disc 06/30/2013    Class: Acute    REFERRING DIAG: M48.07 (ICD-10-CM) - Spinal  stenosis of lumbosacral region M43.16 (ICD-10-CM) - Spondylolisthesis, lumbar region M70.62 (ICD-10-CM) - Trochanteric bursitis, left hip M51.36 (ICD-10-CM) - Degenerative disc disease, lumbar  THERAPY DIAG:  Chronic bilateral low back pain without sciatica  Pain in left hip  Muscle weakness (generalized)  Abnormal posture  Difficulty in walking, not elsewhere classified   SUBJECTIVE: Pt states that the low back and Lt hip are stiff today, which is common in the mornings. She states that there is no pain however.   PAIN:  Are you having pain? No NPRS scale: none Pain  location: Lt hip/thigh Pain orientation: Lt  PAIN TYPE: chronic  Pain description: intermittent , achy, throbbing, stiff Aggravating factors: walking, standing porlonged  Relieving factors: rest   PRECAUTIONS: None   WEIGHT BEARING RESTRICTIONS No    PATIENT GOALS Reduce pain to do normal life.      OBJECTIVE:    DIAGNOSTIC FINDINGS:  Lumbar spondylolisthesis, DDD L4-5.  MRI   PATIENT SURVEYS:  FOTO 06/19/2021  intake: 38 predicted: 44      MUSCLE LENGTH: 06/19/2021: Passive SLR Lt 65 degrees Passive SLR Rt 75 degrees    LUMBARAROM/PROM   A/PROM A  06/19/2021  Flexion Movement to mid shin c Lt lumbar/posterior Lt hip pain  Extension 25 % WFL c end range pain lumbar.  Repeated x 5 in standing produced similar end range pain.  Marked reduction in lumbar mobility (thoracolumbar junction hinge movement noted)  Right lateral flexion    Left lateral flexion    Right rotation    Left rotation     (Blank rows = not tested)   LE AROM/PROM:   A/PROM Right 06/19/2021 Left 06/19/2021  Hip flexion      Hip extension      Hip abduction      Hip adduction      Hip internal rotation PROM measured in supine 90 deg hip flexion: 25  PROM measured in supine 90 deg hip flexion:  30  Hip external rotation PROM measured in supine 90 deg hip flexion: 50 PROM measured in supine 90 deg hip flexion: 40  Knee flexion      Knee extension      Ankle dorsiflexion      Ankle plantarflexion      Ankle inversion      Ankle eversion       (Blank rows = not tested)   LE MMT:   MMT Right 06/19/2021 Left 06/19/2021 Right 07/03/2021 Left 07/03/2021  Hip flexion 5/5   4/5    Hip extension        Hip abduction 3+/5 2+/5 c pain  3+/5  Hip adduction        Hip internal rotation        Hip external rotation        Knee flexion 5/5 5/5    Knee extension 5/5 4/5    Ankle dorsiflexion 5/5 5/5    Ankle plantarflexion        Ankle inversion        Ankle eversion         (Blank rows = not tested)    LUMBAR SPECIAL TESTS:  06/19/2021:(-) slump bilateral, (-) crossed slr bilateral for radicular symptoms produced.  + FABER Lt hip for pain   FUNCTIONAL TESTS:  06/19/2021: 18 inch chair s UE assist: increased difficulty but able to perform, decreased control upon lowering.   Ambulation/Gait  07/01/2021 : forward trunk lean, minimal Lt hip extension in stance, decreased stance  on Lt      TODAY'S TREATMENT  07/11/2021  Therapeutic Exercise:  Aerobic: Recumbent Bike Lvl 4.0 x 8 minutes Supine: Supine bridges with ball squeeze 2 x 10 3 second hold, bilat LE on pball rolling into hip flexion x 20 10 sec. Hold, Lumbar rotation bilat x 10 Prone:  Seated:  Standing: Gastroc stretch at slant board 3 x 30sec.; At countertop with 3# ankle weights 2 x 10- Hip abduction, extension, hamstring curls, marches  Neuromuscular Re-education: Manual Therapy: percussive device to Lt/ Rt lateral hip/thigh (glutes, IT band, hamstrings)  Therapeutic Activity: Self Care: Trigger Point Dry Needling:  Modalities:    07/08/2021   Therapeutic Exercise:   Aerobic: Nustep Lvl 5.0 x 8 minutes Supine: Supine bridges with ball squeeze 2 x 10 3 second hold, bilat LE on pball rolling into hip flexion x 15 10 sec. hold Prone:  Seated: Green ball rollouts into lumbar flexion middle, right and left x 10 each way  Standing: Standing at countertop abduction and extension 2# bilat 2 x 10, Hamstring curls 2# x 10 bilat, Standing marches at [employer] 2# 2 x 10 bilat  Neuromuscular Re-education: Manual Therapy: percussive device to Lt/ Rt lateral hip/thigh (glutes, IT band, hamstrings)  Therapeutic Activity: Self Care: Trigger Point Dry Needling:  Modalities:    07/03/2021  Therapeutic Exercise: Nustep Lvl 5 10 mins   Supine bridge 2 x 10 Sidelying clam shell, performed bilateral 3x 10  Sidelying reverse clam shell performed bilateral 3 x 10 Sit to stand from 18 inch chair s UE assist x 10    Manual Therapy:  STM,  percussive device to Lt lateral hip/thigh (glute med, lateral quad)      PATIENT EDUCATION:  06/19/2021: Education details: HEP, POC Person educated: Patient Education method: Consulting civil engineer, Media planner, Verbal cues, and Handouts Education comprehension: verbalized understanding and returned demonstration     HOME EXERCISE PROGRAM: 06/19/2021: Access Code: [email or web address] URL: [email or web address] Date: 06/19/2021 Prepared by: Scot Jun   Exercises Supine Lower Trunk Rotation - 2 x daily - 7 x weekly - 1 sets - 5 reps - 15 hold Supine Bridge - 2 x daily - 7 x weekly - 3 sets - 10 reps - 2 hold Clamshell - 1-2 x daily - 7 x weekly - 3 sets - 10 reps Supine Piriformis Stretch with Foot on Ground (Mirrored) - 2 x daily - 7 x weekly - 1 sets - 5 reps - 30 hold Supine Figure 4 Piriformis Stretch (Mirrored) - 2 x daily - 7 x weekly - 1 sets - 5 reps - 30 hold     ASSESSMENT:   CLINICAL IMPRESSION: Session emphasized strengthening of the Lt hip and increasing lumbar ROM. Pt is showing improvement with Lt hip strength as seen with increasing ankle weight to 3# with exercises today. Pt demonstrates functional improvement with muscle endurance and increased lumbar ROM shown with progression of previous exercises. Percussion device was used today to help decrease muscle tightness, this was received well by the pt.     REHAB POTENTIAL: Good   CLINICAL DECISION MAKING: Stable/uncomplicated   EVALUATION COMPLEXITY: Low     GOALS: Goals reviewed with patient? Yes   SHORT TERM GOALS:   STG Name Target Date Goal status  1 Patient will demonstrate independent use of home exercise program to maintain progress from in clinic treatments.  07/10/2021 On going  Assessed 07/01/2021  LONG TERM GOALS:    LTG Name Target Date Goal status  1 Patient will demonstrate/report pain at worst less than or equal to 2/10 to  facilitate minimal limitation in daily activity secondary to pain symptoms.  08/28/2021 On- going  2 Patient will demonstrate independent use of home exercise program to facilitate ability to maintain/progress functional gains from skilled physical therapy services.  08/28/2021 On- going  3 Patient will demonstrate FOTO outcome > or = 41% to indicated reduced disability due to condition. 08/28/2021 On- going  4 Patient will demonstrate lumbar extension > or = 75% WFL s symptoms to facilitate upright standing, walking posture at PLOF s limitation. 08/28/2021 On- going  5 Patient will demonstrate Lt knee MMT 5/5, bilateral hip abduction MMT > or = 4/5 to facilitate transfers, standing, walking at PLOF.    08/28/2021 On- going  6 Patient will demonstrate sit to stand to sit c good control no UE assist to facilitate improved mobility and functional level.    08/28/2021 On- going  7 Patient will demonstrate bilateral IR/ER hip AROM gains > or = 10 degrees to facilitate functional movement improvement.    08/28/2021 On- going    PLAN: PT FREQUENCY: 1-2x/week     PT DURATION: 10 weeks   PLANNED INTERVENTIONS: Therapeutic exercises, Therapeutic activity, Neuro Muscular re-education, Balance training, Gait training, Patient/Family education, Joint mobilization, Stair training, DME instructions, Dry Needling, Electrical stimulation, Cryotherapy, Moist heat, Taping, Ultrasound, Ionotophoresis 4mg /ml Dexamethasone, and Manual therapy   PLAN FOR NEXT SESSION: Reassess measurements and goals, progress Lft hip strengthening and lumbar ROM exercises.  Cherae Marton Singer, SPT 63/89/37  10:26 AM

## 2021-07-15 ENCOUNTER — Encounter: Payer: Medicare Other | Admitting: Physical Therapy

## 2021-07-15 DIAGNOSIS — H40013 Open angle with borderline findings, low risk, bilateral: Secondary | ICD-10-CM | POA: Diagnosis not present

## 2021-07-15 DIAGNOSIS — H469 Unspecified optic neuritis: Secondary | ICD-10-CM | POA: Diagnosis not present

## 2021-07-15 DIAGNOSIS — H00025 Hordeolum internum left lower eyelid: Secondary | ICD-10-CM | POA: Diagnosis not present

## 2021-07-17 ENCOUNTER — Other Ambulatory Visit: Payer: Self-pay

## 2021-07-17 ENCOUNTER — Ambulatory Visit (INDEPENDENT_AMBULATORY_CARE_PROVIDER_SITE_OTHER): Payer: Medicare Other | Admitting: Physical Therapy

## 2021-07-17 ENCOUNTER — Encounter: Payer: Self-pay | Admitting: Physical Therapy

## 2021-07-17 DIAGNOSIS — R262 Difficulty in walking, not elsewhere classified: Secondary | ICD-10-CM

## 2021-07-17 DIAGNOSIS — M545 Low back pain, unspecified: Secondary | ICD-10-CM | POA: Diagnosis not present

## 2021-07-17 DIAGNOSIS — M6281 Muscle weakness (generalized): Secondary | ICD-10-CM

## 2021-07-17 DIAGNOSIS — G8929 Other chronic pain: Secondary | ICD-10-CM

## 2021-07-17 DIAGNOSIS — M25552 Pain in left hip: Secondary | ICD-10-CM | POA: Diagnosis not present

## 2021-07-17 DIAGNOSIS — R293 Abnormal posture: Secondary | ICD-10-CM | POA: Diagnosis not present

## 2021-07-17 NOTE — Therapy (Signed)
OUTPATIENT PHYSICAL THERAPY TREATMENT NOTE   Patient Name: Angela Pugh MRN: [id number] DOB:09/20/1945, 76 y.o., female Today's Date: 07/17/2021  PCP: [name], MD REFERRING PROVIDER: Jessy Oto, MD   PT End of Session - 07/17/21 0801     Visit Number 7    Number of Visits 20    Date for PT Re-Evaluation 08/28/21    Authorization Type UHC medicare, AARP    Progress Note Due on Visit 10    PT Start Time 0802    PT Stop Time 0845    PT Time Calculation (min) 43 min    Activity Tolerance Patient tolerated treatment well    Behavior During Therapy [facility] for tasks assessed/performed                  Past Medical History:  Diagnosis Date   Arthritis    back, knees, shoulder, hands , injections in pelvis, for bone spurs (05/04/2016)   Chronic back pain    Diverticulosis    History of kidney stones    HLD (hyperlipidemia)    Hypertension    Migraine    hx (05/04/2016)   Peripheral neuropathy    Radiculopathy    Sinus complaint    Sinus headache    occasional (05/04/2016)   Type II diabetes mellitus (HCC)    diet controlled, no meds now, was on insulin & po treatment at one time     Vertigo    Past Surgical History:  Procedure Laterality Date   ANTERIOR CERVICAL DECOMP/DISCECTOMY FUSION N/A 04/17/2016   Procedure: ANTERIOR CERVICAL DECOMPRESSION/DISCECTOMY FUSION CERVICAL THREE - CERVICAL FOUR, POSTERIOR CERVICAL DISCECTOMY CERVICAL SEVEN -THORASIC ONE LEFT;  Surgeon: Ashok Pall, MD;  Location: [place];  Service: Neurosurgery;  Laterality: N/A;  Anterior/Posterior   ANTERIOR FUSION CERVICAL SPINE  2000   Dr. Carloyn Manner; "for bone spurs; put hardware in too"   BACK SURGERY     COLONOSCOPY  06/10/2016   [facility]   LEFT HEART CATH AND CORONARY ANGIOGRAPHY N/A 03/17/2018   Procedure: LEFT HEART CATH AND CORONARY ANGIOGRAPHY;  Surgeon: Lorretta Harp, MD;  Location: [place] [facility];  Service: Cardiovascular;  Laterality: N/A;   LUMBAR LAMINECTOMY Left  06/30/2013   Procedure: Left L3-4 Extraforaminal approach to excise far lateral herniated nucleus pulposus;  Surgeon: Jessy Oto, MD;  Location: [place];  Service: Orthopedics;  Laterality: Left;   TUBAL LIGATION     VAGINAL HYSTERECTOMY     Patient Active Problem List   Diagnosis Date Noted   Headache above the eye region 02/19/2021   Nasal congestion 02/19/2021   Emphysema lung ([place]) 10/04/2020   Abdominal pain, epigastric 03/18/2020   Low back pain without sciatica 10/30/2019   Body mass index (BMI) 26.0-26.9, adult 10/23/2019   Nonischemic cardiomyopathy ([place]) 04/12/2018   Chest pain 01/25/2018   Left bundle branch block 01/25/2018   Carotid artery disease ([place]) 01/25/2018   Seasonal allergies 10/18/2017   Arthritis 10/18/2017   Pre-diabetes 10/18/2017   Protein-calorie malnutrition, severe 05/05/2016   Dysphagia 05/04/2016   HNP (herniated nucleus pulposus) with myelopathy, cervical 04/17/2016   Abdominal pain 02/01/2016   Acute diverticulitis 02/01/2016   HLD (hyperlipidemia) 02/01/2016   Diabetes mellitus without complication ([place]) 10/93/2355   Hypokalemia 02/01/2016   Hypertension    Cervical spondylosis without myelopathy 01/13/2016   Bilateral carpal tunnel syndrome 01/13/2016   Herniated lumbar intervertebral disc 06/30/2013    Class: Acute    REFERRING DIAG: M48.07 (ICD-10-CM) -  Spinal stenosis of lumbosacral region M43.16 (ICD-10-CM) - Spondylolisthesis, lumbar region M70.62 (ICD-10-CM) - Trochanteric bursitis, left hip M51.36 (ICD-10-CM) - Degenerative disc disease, lumbar  THERAPY DIAG:  Chronic bilateral low back pain without sciatica  Pain in left hip  Muscle weakness (generalized)  Abnormal posture  Difficulty in walking, not elsewhere classified   SUBJECTIVE:She states she felt good after last session and even went home and worked in the yard. She was then in more pain yesterday and unsure it this was due to the rain/weather or from over activity  but today she feels much better only 2/10 overall pain and that PT has really helped with her back pain.  PAIN:  Are you having pain? No NPRS scale: none Pain location: Lt hip/thigh Pain orientation: Lt  PAIN TYPE: chronic  Pain description: intermittent , achy, throbbing, stiff Aggravating factors: walking, standing porlonged  Relieving factors: rest   PRECAUTIONS: None   WEIGHT BEARING RESTRICTIONS No    PATIENT GOALS Reduce pain to do normal life.      OBJECTIVE:    DIAGNOSTIC FINDINGS:  Lumbar spondylolisthesis, DDD L4-5.  MRI   PATIENT SURVEYS:  FOTO 06/19/2021  intake: 38 predicted: 44      MUSCLE LENGTH: 06/19/2021: Passive SLR Lt 65 degrees Passive SLR Rt 75 degrees    LUMBARAROM/PROM   A/PROM A  06/19/2021  Flexion Movement to mid shin c Lt lumbar/posterior Lt hip pain  Extension 25 % WFL c end range pain lumbar.  Repeated x 5 in standing produced similar end range pain.  Marked reduction in lumbar mobility (thoracolumbar junction hinge movement noted)  Right lateral flexion    Left lateral flexion    Right rotation    Left rotation     (Blank rows = not tested)   LE AROM/PROM:   A/PROM Right 06/19/2021 Left 06/19/2021  Hip flexion      Hip extension      Hip abduction      Hip adduction      Hip internal rotation PROM measured in supine 90 deg hip flexion: 25  PROM measured in supine 90 deg hip flexion:  30  Hip external rotation PROM measured in supine 90 deg hip flexion: 50 PROM measured in supine 90 deg hip flexion: 40  Knee flexion      Knee extension      Ankle dorsiflexion      Ankle plantarflexion      Ankle inversion      Ankle eversion       (Blank rows = not tested)   LE MMT:   MMT Right 06/19/2021 Left 06/19/2021 Left 07/03/2021 Left 07/17/21 Right 07/17/21  Hip flexion 5/5   4/5  5/5 5/5  Hip extension         Hip abduction 3+/5 2+/5 c pain 3+/5 4+/5 4/5  Hip adduction         Hip internal rotation         Hip external rotation          Knee flexion 5/5 5/5     Knee extension 5/5 4/5  5/5   Ankle dorsiflexion 5/5 5/5     Ankle plantarflexion         Ankle inversion         Ankle eversion          (Blank rows = not tested)     FUNCTIONAL TESTS:  07/17/2021: no longer difficulty with sit to stand  Ambulation/Gait  07/01/2021 :  forward trunk lean, minimal Lt hip extension in stance, decreased stance on Lt      TODAY'S TREATMENT   07/17/2021 Therapeutic Exercise:  Aerobic:recumbent bike L3 X 8 min Supine: Prone:  Seated: Leg press DL 62# 3 x 10  Standing: At countertop- hip abd/ ext x 10 bilat 3# weight; Hamstring curls 2 x 10 bilat; Marches 2 x 10 bilat Neuromuscular Re-education: Manual Therapy: percussive device to Lt/ Rt lateral hip/thigh (glutes, IT band, hamstrings)  Therapeutic Activity: Self Care: Trigger Point Dry Needling:  Modalities:    07/11/2021  Therapeutic Exercise:  Aerobic: Recumbent Bike Lvl 4.0 x 8 minutes Supine: Supine bridges with ball squeeze 2 x 10 3 second hold, bilat LE on pball rolling into hip flexion x 20 10 sec. Hold, Lumbar rotation bilat x 10 Prone:  Seated:  Standing: Gastroc stretch at slant board 3 x 30sec.; At countertop with 3# ankle weights 2 x 10- Hip abduction, extension, hamstring curls, marches  Neuromuscular Re-education: Manual Therapy: percussive device to Lt/ Rt lateral hip/thigh (glutes, IT band, hamstrings)  Therapeutic Activity: Self Care: Trigger Point Dry Needling:  Modalities:     HOME EXERCISE PROGRAM: 06/19/2021: Access Code: [email or web address] URL: [email or web address] Date: 06/19/2021 Prepared by: Scot Jun   Exercises Supine Lower Trunk Rotation - 2 x daily - 7 x weekly - 1 sets - 5 reps - 15 hold Supine Bridge - 2 x daily - 7 x weekly - 3 sets - 10 reps - 2 hold Clamshell - 1-2 x daily - 7 x weekly - 3 sets - 10 reps Supine Piriformis Stretch with Foot on Ground (Mirrored) - 2 x daily - 7 x weekly - 1 sets - 5 reps - 30  hold Supine Figure 4 Piriformis Stretch (Mirrored) - 2 x daily - 7 x weekly - 1 sets - 5 reps - 30 hold     ASSESSMENT:   CLINICAL IMPRESSION: Session focused on reassessing pt's STG's and hip strength measurements. Pt is showing improvement with bilat hip strength, and has expressed functional improvements as well, noted with household activities and yard work. We will continue to progress hip strength exercises for continued improvements to be made.     REHAB POTENTIAL: Good   CLINICAL DECISION MAKING: Stable/uncomplicated   EVALUATION COMPLEXITY: Low     GOALS: Goals reviewed with patient? Yes   SHORT TERM GOALS:   STG Name Target Date Goal status  1 Patient will demonstrate independent use of home exercise program to maintain progress from in clinic treatments.  07/10/2021 Met                                                            LONG TERM GOALS:    LTG Name Target Date Goal status  1 Patient will demonstrate/report pain at worst less than or equal to 2/10 to facilitate minimal limitation in daily activity secondary to pain symptoms.  08/28/2021 On- going  2 Patient will demonstrate independent use of home exercise program to facilitate ability to maintain/progress functional gains from skilled physical therapy services.  08/28/2021 On- going  3 Patient will demonstrate FOTO outcome > or = 41% to indicated reduced disability due to condition. 08/28/2021 On- going  4 Patient will demonstrate lumbar extension > or = 75% WFL s symptoms to facilitate  upright standing, walking posture at PLOF s limitation. 08/28/2021 On- going  5 Patient will demonstrate Lt knee MMT 5/5, bilateral hip abduction MMT > or = 4/5 to facilitate transfers, standing, walking at PLOF.    08/28/2021 On- going  6 Patient will demonstrate sit to stand to sit c good control no UE assist to facilitate improved mobility and functional level.    08/28/2021 On- going  7 Patient will demonstrate bilateral  IR/ER hip AROM gains > or = 10 degrees to facilitate functional movement improvement.    08/28/2021 On- going    PLAN: PT FREQUENCY: 1-2x/week     PT DURATION: 10 weeks   PLANNED INTERVENTIONS: Therapeutic exercises, Therapeutic activity, Neuro Muscular re-education, Balance training, Gait training, Patient/Family education, Joint mobilization, Stair training, DME instructions, Dry Needling, Electrical stimulation, Cryotherapy, Moist heat, Taping, Ultrasound, Ionotophoresis 93m/ml Dexamethasone, and Manual therapy   PLAN FOR NEXT SESSION: Continue to progress hip strength exercises, increase weight on leg press  AWilson Singer SPT 043/60/67 9:19 AM

## 2021-07-22 ENCOUNTER — Other Ambulatory Visit: Payer: Self-pay

## 2021-07-22 ENCOUNTER — Ambulatory Visit (INDEPENDENT_AMBULATORY_CARE_PROVIDER_SITE_OTHER): Payer: Medicare Other | Admitting: Physical Therapy

## 2021-07-22 ENCOUNTER — Encounter: Payer: Self-pay | Admitting: Physical Therapy

## 2021-07-22 DIAGNOSIS — M25552 Pain in left hip: Secondary | ICD-10-CM

## 2021-07-22 DIAGNOSIS — M545 Low back pain, unspecified: Secondary | ICD-10-CM

## 2021-07-22 DIAGNOSIS — R293 Abnormal posture: Secondary | ICD-10-CM | POA: Diagnosis not present

## 2021-07-22 DIAGNOSIS — M6281 Muscle weakness (generalized): Secondary | ICD-10-CM | POA: Diagnosis not present

## 2021-07-22 DIAGNOSIS — G8929 Other chronic pain: Secondary | ICD-10-CM | POA: Diagnosis not present

## 2021-07-22 DIAGNOSIS — R262 Difficulty in walking, not elsewhere classified: Secondary | ICD-10-CM | POA: Diagnosis not present

## 2021-07-22 NOTE — Therapy (Signed)
OUTPATIENT PHYSICAL THERAPY TREATMENT NOTE   Patient Name: Angela Pugh MRN: [id number] DOB:1945-11-28, 76 y.o., female Today's Date: 07/22/2021  PCP: [name], MD REFERRING PROVIDER: Jessy Oto, MD   PT End of Session - 07/22/21 [phone]     Visit Number 8    Number of Visits 20    Date for PT Re-Evaluation 08/28/21    Authorization Type UHC medicare, A[employer]    Progress Note Due on Visit 10    PT Start Time 0801    PT Stop Time 0845    PT Time Calculation (min) 44 min    Activity Tolerance Patient tolerated treatment well    Behavior During Therapy [facility] for tasks assessed/performed                  Past Medical History:  Diagnosis Date   Arthritis    back, knees, shoulder, hands , injections in pelvis, for bone spurs (05/04/2016)   Chronic back pain    Diverticulosis    History of kidney stones    HLD (hyperlipidemia)    Hypertension    Migraine    hx (05/04/2016)   Peripheral neuropathy    Radiculopathy    Sinus complaint    Sinus headache    occasional (05/04/2016)   Type II diabetes mellitus (HCC)    diet controlled, no meds now, was on insulin & po treatment at one time     Vertigo    Past Surgical History:  Procedure Laterality Date   ANTERIOR CERVICAL DECOMP/DISCECTOMY FUSION N/A 04/17/2016   Procedure: ANTERIOR CERVICAL DECOMPRESSION/DISCECTOMY FUSION CERVICAL THREE - CERVICAL FOUR, POSTERIOR CERVICAL DISCECTOMY CERVICAL SEVEN -THORASIC ONE LEFT;  Surgeon: Ashok Pall, MD;  Location: [place];  Service: Neurosurgery;  Laterality: N/A;  Anterior/Posterior   ANTERIOR FUSION CERVICAL SPINE  2000   Dr. Carloyn Manner; "for bone spurs; put hardware in too"   BACK SURGERY     COLONOSCOPY  06/10/2016   [facility]   LEFT HEART CATH AND CORONARY ANGIOGRAPHY N/A 03/17/2018   Procedure: LEFT HEART CATH AND CORONARY ANGIOGRAPHY;  Surgeon: Lorretta Harp, MD;  Location: [place] CV [facility];  Service: Cardiovascular;  Laterality: N/A;   LUMBAR LAMINECTOMY Left  06/30/2013   Procedure: Left L3-4 Extraforaminal approach to excise far lateral herniated nucleus pulposus;  Surgeon: Jessy Oto, MD;  Location: [place];  Service: Orthopedics;  Laterality: Left;   TUBAL LIGATION     VAGINAL HYSTERECTOMY     Patient Active Problem List   Diagnosis Date Noted   Headache above the eye region 02/19/2021   Nasal congestion 02/19/2021   Emphysema lung ([place]) 10/04/2020   Abdominal pain, epigastric 03/18/2020   Low back pain without sciatica 10/30/2019   Body mass index (BMI) 26.0-26.9, adult 10/23/2019   Nonischemic cardiomyopathy ([place]) 04/12/2018   Chest pain 01/25/2018   Left bundle branch block 01/25/2018   Carotid artery disease ([place]) 01/25/2018   Seasonal allergies 10/18/2017   Arthritis 10/18/2017   Pre-diabetes 10/18/2017   Protein-calorie malnutrition, severe 05/05/2016   Dysphagia 05/04/2016   HNP (herniated nucleus pulposus) with myelopathy, cervical 04/17/2016   Abdominal pain 02/01/2016   Acute diverticulitis 02/01/2016   HLD (hyperlipidemia) 02/01/2016   Diabetes mellitus without complication ([place]) 97/67/3419   Hypokalemia 02/01/2016   Hypertension    Cervical spondylosis without myelopathy 01/13/2016   Bilateral carpal tunnel syndrome 01/13/2016   Herniated lumbar intervertebral disc 06/30/2013    Class: Acute    REFERRING DIAG: M48.07 (ICD-10-CM) -  Spinal stenosis of lumbosacral region M43.16 (ICD-10-CM) - Spondylolisthesis, lumbar region M70.62 (ICD-10-CM) - Trochanteric bursitis, left hip M51.36 (ICD-10-CM) - Degenerative disc disease, lumbar  THERAPY DIAG:  Chronic bilateral low back pain without sciatica  Pain in left hip  Muscle weakness (generalized)  Abnormal posture  Difficulty in walking, not elsewhere classified   SUBJECTIVE:She states she is overall doing better then she did a lot of work in the yard which has her back pain flared up  PAIN:  Are you having pain? yes NPRS scale: 8/10 Pain location: Lt  hip/thigh Pain orientation: Lt  PAIN TYPE: chronic  Pain description: intermittent , achy, throbbing, stiff Aggravating factors: walking, standing porlonged  Relieving factors: rest   PRECAUTIONS: None   WEIGHT BEARING RESTRICTIONS No    PATIENT GOALS Reduce pain to do normal life.      OBJECTIVE:    DIAGNOSTIC FINDINGS:  Lumbar spondylolisthesis, DDD L4-5.  MRI   PATIENT SURVEYS:  FOTO 06/19/2021  intake: 38 predicted: 44      MUSCLE LENGTH: 06/19/2021: Passive SLR Lt 65 degrees Passive SLR Rt 75 degrees    LUMBARAROM/PROM   A/PROM A  06/19/2021  Flexion Movement to mid shin c Lt lumbar/posterior Lt hip pain  Extension 25 % WFL c end range pain lumbar.  Repeated x 5 in standing produced similar end range pain.  Marked reduction in lumbar mobility (thoracolumbar junction hinge movement noted)  Right lateral flexion    Left lateral flexion    Right rotation    Left rotation     (Blank rows = not tested)   LE AROM/PROM:   A/PROM Right 06/19/2021 Left 06/19/2021  Hip flexion      Hip extension      Hip abduction      Hip adduction      Hip internal rotation PROM measured in supine 90 deg hip flexion: 25  PROM measured in supine 90 deg hip flexion:  30  Hip external rotation PROM measured in supine 90 deg hip flexion: 50 PROM measured in supine 90 deg hip flexion: 40  Knee flexion      Knee extension      Ankle dorsiflexion      Ankle plantarflexion      Ankle inversion      Ankle eversion       (Blank rows = not tested)   LE MMT:   MMT Right 06/19/2021 Left 06/19/2021 Left 07/03/2021 Left 07/17/21 Right 07/17/21  Hip flexion 5/5   4/5  5/5 5/5  Hip extension         Hip abduction 3+/5 2+/5 c pain 3+/5 4+/5 4/5  Hip adduction         Hip internal rotation         Hip external rotation         Knee flexion 5/5 5/5     Knee extension 5/5 4/5  5/5   Ankle dorsiflexion 5/5 5/5     Ankle plantarflexion         Ankle inversion         Ankle eversion           (Blank rows = not tested)     FUNCTIONAL TESTS:  07/17/2021: no longer difficulty with sit to stand  Ambulation/Gait  07/01/2021 : forward trunk lean, minimal Lt hip extension in stance, decreased stance on Lt      TODAY'S TREATMENT   07/17/2021 Therapeutic Exercise:  Aerobic:recumbent bike L3 X 8 min  Supine:bridge with red band and clam X10 Prone:  Seated: Leg press DL 75# 3 x 10, lumbar flexion stretch 10 sec X5, piriformis stretch 20 sec X3  Standing: At countertop- hip abd/ex/marches X 15 bilat with red band Neuromuscular Re-education: Manual Therapy: percussive device to Lt/ Rt lateral hip/thigh (glutes, IT band, hamstrings)  Therapeutic Activity: Self Care: Trigger Point Dry Needling:  Modalities:   07/17/2021 Therapeutic Exercise:  Aerobic:recumbent bike L3 X 8 min Supine: Prone:  Seated: Leg press DL 62# 3 x 10  Standing: At countertop- hip abd/ ext x 10 bilat 3# weight; Hamstring curls 2 x 10 bilat; Marches 2 x 10 bilat Neuromuscular Re-education: Manual Therapy: percussive device to Lt/ Rt lateral hip/thigh (glutes, IT band, hamstrings)  Therapeutic Activity: Self Care: Trigger Point Dry Needling:  Modalities:    HOME EXERCISE PROGRAM: 07/22/21 Access Code: [email or web address] URL: [email or web address] Date: 07/22/2021 Prepared by: Elsie Ra  Exercises Seated Piriformis Stretch with Trunk Bend - 2 x daily - 6 x weekly - 2 reps - 1 sets - 30 hold Seated Lumbar Flexion Stretch - 2 x daily - 6 x weekly - 1 sets - 5 reps - 10 hold Supine Lower Trunk Rotation - 2 x daily - 7 x weekly - 1 sets - 5 reps - 15 hold Bridge with Abduction and Resistance Loop - 2 x daily - 6 x weekly - 2-3 sets - 10 reps Standing Hip Abduction with Resistance at Ankles and Counter Support - 2 x daily - 6 x weekly - 2-3 sets - 10 reps Standing Hip Extension with Resistance at Ankles and Counter Support - 2 x daily - 6 x weekly - 2-3 sets - 10 reps Mini Squat with Counter Support  - 2 x daily - 6 x weekly - 1-2 sets - 10 reps      ASSESSMENT:   CLINICAL IMPRESSION: I updated her HEP as she is making good early progress with PT and was able to progress to more strengthening. She showed good understanding of new exercises. She does still have some signs of intermittent sciatica and will continue to benefit from PT. She does get significant relief from thera gun percussive device STM.    REHAB POTENTIAL: Good   CLINICAL DECISION MAKING: Stable/uncomplicated   EVALUATION COMPLEXITY: Low     GOALS: Goals reviewed with patient? Yes   SHORT TERM GOALS:   STG Name Target Date Goal status  1 Patient will demonstrate independent use of home exercise program to maintain progress from in clinic treatments.  07/10/2021 Met                                                            LONG TERM GOALS:    LTG Name Target Date Goal status  1 Patient will demonstrate/report pain at worst less than or equal to 2/10 to facilitate minimal limitation in daily activity secondary to pain symptoms.  08/28/2021 On- going  2 Patient will demonstrate independent use of home exercise program to facilitate ability to maintain/progress functional gains from skilled physical therapy services.  08/28/2021 On- going  3 Patient will demonstrate FOTO outcome > or = 41% to indicated reduced disability due to condition. 08/28/2021 On- going  4 Patient will demonstrate lumbar extension > or = 75% WFL s symptoms to  facilitate upright standing, walking posture at PLOF s limitation. 08/28/2021 On- going  5 Patient will demonstrate Lt knee MMT 5/5, bilateral hip abduction MMT > or = 4/5 to facilitate transfers, standing, walking at PLOF.    08/28/2021 On- going  6 Patient will demonstrate sit to stand to sit c good control no UE assist to facilitate improved mobility and functional level.    08/28/2021 On- going  7 Patient will demonstrate bilateral IR/ER hip AROM gains > or = 10 degrees to facilitate  functional movement improvement.    08/28/2021 On- going    PLAN: PT FREQUENCY: 1-2x/week     PT DURATION: 10 weeks   PLANNED INTERVENTIONS: Therapeutic exercises, Therapeutic activity, Neuro Muscular re-education, Balance training, Gait training, Patient/Family education, Joint mobilization, Stair training, DME instructions, Dry Needling, Electrical stimulation, Cryotherapy, Moist heat, Taping, Ultrasound, Ionotophoresis 72m/ml Dexamethasone, and Manual therapy   PLAN FOR NEXT SESSION: Continue to progress lumbar/hip strength as tolerance  BElsie Ra PT, DPT 07/22/21 8:18 AM

## 2021-07-24 ENCOUNTER — Other Ambulatory Visit: Payer: Self-pay

## 2021-07-24 ENCOUNTER — Ambulatory Visit (INDEPENDENT_AMBULATORY_CARE_PROVIDER_SITE_OTHER): Payer: Medicare Other | Admitting: Rehabilitative and Restorative Service Providers"

## 2021-07-24 ENCOUNTER — Encounter: Payer: Self-pay | Admitting: Rehabilitative and Restorative Service Providers"

## 2021-07-24 DIAGNOSIS — R293 Abnormal posture: Secondary | ICD-10-CM | POA: Diagnosis not present

## 2021-07-24 DIAGNOSIS — G8929 Other chronic pain: Secondary | ICD-10-CM | POA: Diagnosis not present

## 2021-07-24 DIAGNOSIS — M25552 Pain in left hip: Secondary | ICD-10-CM | POA: Diagnosis not present

## 2021-07-24 DIAGNOSIS — M6281 Muscle weakness (generalized): Secondary | ICD-10-CM

## 2021-07-24 DIAGNOSIS — R262 Difficulty in walking, not elsewhere classified: Secondary | ICD-10-CM

## 2021-07-24 DIAGNOSIS — M545 Low back pain, unspecified: Secondary | ICD-10-CM | POA: Diagnosis not present

## 2021-07-24 NOTE — Therapy (Signed)
OUTPATIENT PHYSICAL THERAPY TREATMENT NOTE   Patient Name: Angela Pugh MRN: [id number] DOB:1945-10-16, 76 y.o., female Today's Date: 07/24/2021  PCP: [name], MD REFERRING PROVIDER: Jessy Oto, MD   PT End of Session - 07/24/21 0805     Visit Number 9    Number of Visits 20    Date for PT Re-Evaluation 08/28/21    Authorization Type UHC medicare, AARP    Progress Note Due on Visit 10    PT Start Time 0758    PT Stop Time 0837    PT Time Calculation (min) 39 min    Activity Tolerance Patient tolerated treatment well    Behavior During Therapy [facility] for tasks assessed/performed                   Past Medical History:  Diagnosis Date   Arthritis    back, knees, shoulder, hands , injections in pelvis, for bone spurs (05/04/2016)   Chronic back pain    Diverticulosis    History of kidney stones    HLD (hyperlipidemia)    Hypertension    Migraine    hx (05/04/2016)   Peripheral neuropathy    Radiculopathy    Sinus complaint    Sinus headache    occasional (05/04/2016)   Type II diabetes mellitus (HCC)    diet controlled, no meds now, was on insulin & po treatment at one time     Vertigo    Past Surgical History:  Procedure Laterality Date   ANTERIOR CERVICAL DECOMP/DISCECTOMY FUSION N/A 04/17/2016   Procedure: ANTERIOR CERVICAL DECOMPRESSION/DISCECTOMY FUSION CERVICAL THREE - CERVICAL FOUR, POSTERIOR CERVICAL DISCECTOMY CERVICAL SEVEN -THORASIC ONE LEFT;  Surgeon: Ashok Pall, MD;  Location: [place];  Service: Neurosurgery;  Laterality: N/A;  Anterior/Posterior   ANTERIOR FUSION CERVICAL SPINE  2000   Dr. Carloyn Manner; "for bone spurs; put hardware in too"   BACK SURGERY     COLONOSCOPY  06/10/2016   [facility]   LEFT HEART CATH AND CORONARY ANGIOGRAPHY N/A 03/17/2018   Procedure: LEFT HEART CATH AND CORONARY ANGIOGRAPHY;  Surgeon: Lorretta Harp, MD;  Location: [place] [facility];  Service: Cardiovascular;  Laterality: N/A;   LUMBAR LAMINECTOMY Left  06/30/2013   Procedure: Left L3-4 Extraforaminal approach to excise far lateral herniated nucleus pulposus;  Surgeon: Jessy Oto, MD;  Location: [place];  Service: Orthopedics;  Laterality: Left;   TUBAL LIGATION     VAGINAL HYSTERECTOMY     Patient Active Problem List   Diagnosis Date Noted   Headache above the eye region 02/19/2021   Nasal congestion 02/19/2021   Emphysema lung ([place]) 10/04/2020   Abdominal pain, epigastric 03/18/2020   Low back pain without sciatica 10/30/2019   Body mass index (BMI) 26.0-26.9, adult 10/23/2019   Nonischemic cardiomyopathy ([place]) 04/12/2018   Chest pain 01/25/2018   Left bundle branch block 01/25/2018   Carotid artery disease ([place]) 01/25/2018   Seasonal allergies 10/18/2017   Arthritis 10/18/2017   Pre-diabetes 10/18/2017   Protein-calorie malnutrition, severe 05/05/2016   Dysphagia 05/04/2016   HNP (herniated nucleus pulposus) with myelopathy, cervical 04/17/2016   Abdominal pain 02/01/2016   Acute diverticulitis 02/01/2016   HLD (hyperlipidemia) 02/01/2016   Diabetes mellitus without complication ([place]) 69/62/9528   Hypokalemia 02/01/2016   Hypertension    Cervical spondylosis without myelopathy 01/13/2016   Bilateral carpal tunnel syndrome 01/13/2016   Herniated lumbar intervertebral disc 06/30/2013    Class: Acute    REFERRING DIAG: M48.07 (ICD-10-CM) -  Spinal stenosis of lumbosacral region M43.16 (ICD-10-CM) - Spondylolisthesis, lumbar region M70.62 (ICD-10-CM) - Trochanteric bursitis, left hip M51.36 (ICD-10-CM) - Degenerative disc disease, lumbar  THERAPY DIAG:  Chronic bilateral low back pain without sciatica  Pain in left hip  Muscle weakness (generalized)  Abnormal posture  Difficulty in walking, not elsewhere classified   SUBJECTIVE:  Pt indicated feeling a little pain here and there today "due to weather changes."  Pt indicated she has been doing better in pain and in strength.     PAIN:  Are you having pain?  yes NPRS scale: 2-3/10 Pain location: Lt hip/thigh Pain orientation: Lt  PAIN TYPE: chronic  Pain description: achy "feeling funny." Aggravating factors: morning standing/walking  Relieving factors: rest   PRECAUTIONS: None   WEIGHT BEARING RESTRICTIONS No   PATIENT GOALS Reduce pain to do normal life.      OBJECTIVE:    DIAGNOSTIC FINDINGS:  Lumbar spondylolisthesis, DDD L4-5.  MRI   PATIENT SURVEYS:  FOTO 06/19/2021  intake: 38 predicted: 44      MUSCLE LENGTH: 06/19/2021: Passive SLR Lt 65 degrees Passive SLR Rt 75 degrees    LUMBARAROM/PROM   A/PROM A  06/19/2021 AROM 07/24/2021  Flexion Movement to mid shin c Lt lumbar/posterior Lt hip pain   Extension 25 % WFL c end range pain lumbar.  Repeated x 5 in standing produced similar end range pain.  Marked reduction in lumbar mobility (thoracolumbar junction hinge movement noted) 50% c no pain  Right lateral flexion     Left lateral flexion     Right rotation     Left rotation      (Blank rows = not tested)   LE AROM/PROM:   A/PROM Right 06/19/2021 Left 06/19/2021  Hip flexion      Hip extension      Hip abduction      Hip adduction      Hip internal rotation PROM measured in supine 90 deg hip flexion: 25  PROM measured in supine 90 deg hip flexion:  30  Hip external rotation PROM measured in supine 90 deg hip flexion: 50 PROM measured in supine 90 deg hip flexion: 40  Knee flexion      Knee extension      Ankle dorsiflexion      Ankle plantarflexion      Ankle inversion      Ankle eversion       (Blank rows = not tested)   LE MMT:   MMT Right 06/19/2021 Left 06/19/2021 Left 07/03/2021 Left 07/17/21 Right 07/17/21  Hip flexion 5/5   4/5  5/5 5/5  Hip extension         Hip abduction 3+/5 2+/5 c pain 3+/5 4+/5 4/5  Hip adduction         Hip internal rotation         Hip external rotation         Knee flexion 5/5 5/5     Knee extension 5/5 4/5  5/5   Ankle dorsiflexion 5/5 5/5     Ankle plantarflexion          Ankle inversion         Ankle eversion          (Blank rows = not tested)     FUNCTIONAL TESTS:  07/17/2021: no longer difficulty with sit to stand  Ambulation/Gait  07/01/2021 : forward trunk lean, minimal Lt hip extension in stance, decreased stance on Lt  TODAY'S TREATMENT  07/24/2021 Therapeutic Exercise:  Aerobic:recumbent bike L3 X 10 min Supine:bridge with red band and clam X10  piriformis stretch 20 sec X3  Leg press 75 lbs 2 x 15 double leg, single leg 37 lbs x 15 bilateral   Standing hip abd and ext X 15 bilat with red band with bilateral hand light touch on bar  Standing lumbar extension x 5   Neuromuscular Re-education: Manual Therapy: percussive device to Lt/ Rt lateral hip/thigh (glutes, IT band, hamstrings)    07/22/2021 Therapeutic Exercise:  Aerobic:recumbent bike L3 X 8 min Supine:bridge with red band hip abduction hold x 10  Prone:  Seated: Leg press DL 75# 3 x 10, lumbar flexion stretch 10 sec X5, piriformis stretch 20 sec X3  Standing: At countertop- hip abd/ex/marches X 15 bilat with red band Neuromuscular Re-education: Manual Therapy: percussive device to Lt lateral hip/thigh (glutes, IT band, hamstrings)  Therapeutic Activity: Self Care: Trigger Point Dry Needling:  Modalities:   07/17/2021 Therapeutic Exercise:  Aerobic:recumbent bike L3 X 8 min Supine: Prone:  Seated: Leg press DL 62# 3 x 10  Standing: At countertop- hip abd/ ext x 10 bilat 3# weight; Hamstring curls 2 x 10 bilat; Marches 2 x 10 bilat Neuromuscular Re-education: Manual Therapy: percussive device to Lt/ Rt lateral hip/thigh (glutes, IT band, hamstrings)  Therapeutic Activity: Self Care: Trigger Point Dry Needling:  Modalities:    HOME EXERCISE PROGRAM: 07/22/21 Access Code: [email or web address] URL: [email or web address] Date: 07/22/2021 Prepared by: Elsie Ra  Exercises Seated Piriformis Stretch with Trunk Bend - 2 x daily - 6 x weekly - 2 reps - 1 sets -  30 hold Seated Lumbar Flexion Stretch - 2 x daily - 6 x weekly - 1 sets - 5 reps - 10 hold Supine Lower Trunk Rotation - 2 x daily - 7 x weekly - 1 sets - 5 reps - 15 hold Bridge with Abduction and Resistance Loop - 2 x daily - 6 x weekly - 2-3 sets - 10 reps Standing Hip Abduction with Resistance at Ankles and Counter Support - 2 x daily - 6 x weekly - 2-3 sets - 10 reps Standing Hip Extension with Resistance at Ankles and Counter Support - 2 x daily - 6 x weekly - 2-3 sets - 10 reps Mini Squat with Counter Support - 2 x daily - 6 x weekly - 1-2 sets - 10 reps      ASSESSMENT:   CLINICAL IMPRESSION:  Pt to benefit from continued hip strengthening and general mobility gains to reduced stiffness/tightness within lumbar/hip region.  Making good progress overall in symptom reduction per reporting.     REHAB POTENTIAL: Good   CLINICAL DECISION MAKING: Stable/uncomplicated   EVALUATION COMPLEXITY: Low     GOALS: Goals reviewed with patient? Yes   SHORT TERM GOALS:   STG Name Target Date Goal status  1 Patient will demonstrate independent use of home exercise program to maintain progress from in clinic treatments.  07/10/2021 Met                                                            LONG TERM GOALS:    LTG Name Target Date Goal status  1 Patient will demonstrate/report pain at worst less than or equal to 2/10 to facilitate minimal  limitation in daily activity secondary to pain symptoms.  08/28/2021 On- going  2 Patient will demonstrate independent use of home exercise program to facilitate ability to maintain/progress functional gains from skilled physical therapy services.  08/28/2021 On- going  3 Patient will demonstrate FOTO outcome > or = 41% to indicated reduced disability due to condition. 08/28/2021 On- going  4 Patient will demonstrate lumbar extension > or = 75% WFL s symptoms to facilitate upright standing, walking posture at PLOF s limitation. 08/28/2021 On- going  5  Patient will demonstrate Lt knee MMT 5/5, bilateral hip abduction MMT > or = 4/5 to facilitate transfers, standing, walking at PLOF.    08/28/2021 On- going  6 Patient will demonstrate sit to stand to sit c good control no UE assist to facilitate improved mobility and functional level.    08/28/2021 On- going  7 Patient will demonstrate bilateral IR/ER hip AROM gains > or = 10 degrees to facilitate functional movement improvement.    08/28/2021 On- going    PLAN: PT FREQUENCY: 1-2x/week     PT DURATION: 10 weeks   PLANNED INTERVENTIONS: Therapeutic exercises, Therapeutic activity, Neuro Muscular re-education, Balance training, Gait training, Patient/Family education, Joint mobilization, Stair training, DME instructions, Dry Needling, Electrical stimulation, Cryotherapy, Moist heat, Taping, Ultrasound, Ionotophoresis 52m/ml Dexamethasone, and Manual therapy   PLAN FOR NEXT SESSION: 10th visit progress note/FOTO reassessment.  Continue discussion about long term HEP transition plan.    MScot Jun PT, DPT, OCS, ATC 07/24/21  8:36 AM

## 2021-07-29 ENCOUNTER — Ambulatory Visit (INDEPENDENT_AMBULATORY_CARE_PROVIDER_SITE_OTHER): Payer: Medicare Other | Admitting: Physical Therapy

## 2021-07-29 ENCOUNTER — Encounter: Payer: Self-pay | Admitting: Physical Therapy

## 2021-07-29 ENCOUNTER — Other Ambulatory Visit: Payer: Self-pay

## 2021-07-29 DIAGNOSIS — M25552 Pain in left hip: Secondary | ICD-10-CM

## 2021-07-29 DIAGNOSIS — G8929 Other chronic pain: Secondary | ICD-10-CM

## 2021-07-29 DIAGNOSIS — M6281 Muscle weakness (generalized): Secondary | ICD-10-CM | POA: Diagnosis not present

## 2021-07-29 DIAGNOSIS — R293 Abnormal posture: Secondary | ICD-10-CM | POA: Diagnosis not present

## 2021-07-29 DIAGNOSIS — M545 Low back pain, unspecified: Secondary | ICD-10-CM | POA: Diagnosis not present

## 2021-07-29 DIAGNOSIS — R262 Difficulty in walking, not elsewhere classified: Secondary | ICD-10-CM | POA: Diagnosis not present

## 2021-07-29 NOTE — Therapy (Signed)
?OUTPATIENT PHYSICAL THERAPY TREATMENT NOTE/PROGRESS NOTE ?Progress Note reporting period 06/19/21 to 07/29/21 ? ?See below for objective and subjective measurements relating to patients progress with PT. ? ? ? ?Patient Name: Angela Pugh ?MRN: [id number] ?DOB:1945-06-01, 76 y.o., female ?Today's Date: 07/29/2021 ? ?PCP: [name], MD ?REFERRING PROVIDER: Jessy Oto, MD ? ? PT End of Session - 07/29/21 3710   ? ? Visit Number 10   ? Number of Visits 20   ? Date for PT Re-Evaluation 08/28/21   ? Authorization Type [employer]HC medicare, A[employer]   ? Progress Note Due on Visit 20   ? PT Start Time 0800   ? PT Stop Time 0845   ? PT Time Calculation (min) 45 min   ? Activity Tolerance Patient tolerated treatment well   ? Behavior During Therapy [facility] for tasks assessed/performed   ? ?  ?  ? ?  ? ? ? ? ? ? ? ? ?Past Medical History:  ?Diagnosis Date  ? Arthritis   ? back, knees, shoulder, hands , injections in pelvis, for bone spurs (05/04/2016)  ? Chronic back pain   ? Diverticulosis   ? History of kidney stones   ? HLD (hyperlipidemia)   ? Hypertension   ? Migraine   ? hx (05/04/2016)  ? Peripheral neuropathy   ? Radiculopathy   ? Sinus complaint   ? Sinus headache   ? occasional (05/04/2016)  ? Type II diabetes mellitus ([place])   ? diet controlled, no meds now, was on insulin & po treatment at one time    ? Vertigo   ? ?Past Surgical History:  ?Procedure Laterality Date  ? ANTERIOR CERVICAL DECOMP/DISCECTOMY FUSION N/A 04/17/2016  ? Procedure: ANTERIOR CERVICAL DECOMPRESSION/DISCECTOMY FUSION CERVICAL THREE - CERVICAL FOUR, POSTERIOR CERVICAL DISCECTOMY CERVICAL SEVEN -THORASIC ONE LEFT;  Surgeon: Ashok Pall, MD;  Location: [place];  Service: Neurosurgery;  Laterality: N/A;  Anterior/Posterior  ? ANTERIOR FUSION CERVICAL SPINE  2000  ? Dr. Carloyn Manner; "for bone spurs; put hardware in too"  ? BACK SURGERY    ? COLONOSCOPY  06/10/2016  ? [place]  ? LEFT HEART CATH AND CORONARY ANGIOGRAPHY N/A 03/17/2018  ? Procedure: LEFT HEART CATH  AND CORONARY ANGIOGRAPHY;  Surgeon: Lorretta Harp, MD;  Location: [place] [facility];  Service: Cardiovascular;  Laterality: N/A;  ? LUMBAR LAMINECTOMY Left 06/30/2013  ? Procedure: Left L3-4 Extraforaminal approach to excise far lateral herniated nucleus pulposus;  Surgeon: Jessy Oto, MD;  Location: [place];  Service: Orthopedics;  Laterality: Left;  ? TUBAL LIGATION    ? VAGINAL HYSTERECTOMY    ? ?Patient Active Problem List  ? Diagnosis Date Noted  ? Headache above the eye region 02/19/2021  ? Nasal congestion 02/19/2021  ? Emphysema lung ([place]) 10/04/2020  ? Abdominal pain, epigastric 03/18/2020  ? Low back pain without sciatica 10/30/2019  ? Body mass index (BMI) 26.0-26.9, adult 10/23/2019  ? Nonischemic cardiomyopathy ([place]) 04/12/2018  ? Chest pain 01/25/2018  ? Left bundle branch block 01/25/2018  ? Carotid artery disease ([place]) 01/25/2018  ? Seasonal allergies 10/18/2017  ? Arthritis 10/18/2017  ? Pre-diabetes 10/18/2017  ? Protein-calorie malnutrition, severe 05/05/2016  ? Dysphagia 05/04/2016  ? HNP (herniated nucleus pulposus) with myelopathy, cervical 04/17/2016  ? Abdominal pain 02/01/2016  ? Acute diverticulitis 02/01/2016  ? HLD (hyperlipidemia) 02/01/2016  ? Diabetes mellitus without complication ([place]) 62/69/4854  ? Hypokalemia 02/01/2016  ? Hypertension   ? Cervical spondylosis without myelopathy 01/13/2016  ? Bilateral  carpal tunnel syndrome 01/13/2016  ? Herniated lumbar intervertebral disc 06/30/2013  ?  Class: Acute  ? ? ?REFERRING DIAG: M48.07 (ICD-10-CM) - Spinal stenosis of lumbosacral region ?M43.16 (ICD-10-CM) - Spondylolisthesis, lumbar region ?M70.62 (ICD-10-CM) - Trochanteric bursitis, left hip ?M51.36 (ICD-10-CM) - Degenerative disc disease, lumbar ? ?THERAPY DIAG:  ?Chronic bilateral low back pain without sciatica ? ?Pain in left hip ? ?Muscle weakness (generalized) ? ?Abnormal posture ? ?Difficulty in walking, not elsewhere classified ? ? ?SUBJECTIVE:  Pt indicated feeling  fatigued today and not thinking about a pain rating as she has been up since 2am with her daughter who has been in ER. She does however report she can tell a big positive difference since starting PT. ? ? ?PAIN:  ?Are you having pain? yes ?NPRS scale: unsure today ?Pain location: Lt hip/thigh ?Pain orientation: Lt  ?PAIN TYPE: chronic  ?Pain description: achy ?Aggravating factors: morning standing/walking  ?Relieving factors: rest ?  ?PRECAUTIONS: None ?  ?WEIGHT BEARING RESTRICTIONS No ?  ?PATIENT GOALS Reduce pain to do normal life.  ?  ?  ?OBJECTIVE:  ?  ?DIAGNOSTIC FINDINGS:  ?Lumbar spondylolisthesis, DDD L4-5.  MRI ?  ?PATIENT SURVEYS:  ?FOTO 06/19/2021  intake: 38 predicted: 44 ? ?  ?  ?MUSCLE LENGTH: ?06/19/2021: ?Passive SLR Lt 65 degrees ?Passive SLR Rt 75 degrees ?07/29/21 ?Passive SLR Rt now 75 deg and equal to Rt ? ?  ?LUMBARAROM/PROM ?  ?A/PROM A  ?06/19/2021 AROM ?07/24/2021 AROM  ?07/29/21  ?Flexion Movement to mid shin c Lt lumbar/posterior Lt hip pain  90% no pain  ?Extension 25 % WFL c end range pain lumbar.  Repeated x 5 in standing produced similar end range pain.  Marked reduction in lumbar mobility (thoracolumbar junction hinge movement noted) 50% c no pain 50% no pain  ?Right lateral flexion      ?Left lateral flexion      ?Right rotation    50% no pain  ?Left rotation    50% no pain  ? (Blank rows = not tested) ?  ?LE AROM/PROM: ?  ?A/PROM Right ?06/19/2021 Left ?06/19/2021 Left ?07/29/21 Right ?07/29/21  ?Hip flexion        ?Hip extension        ?Hip abduction        ?Hip adduction        ?Hip internal rotation PROM measured in supine 90 deg hip flexion: 25  PROM measured in supine 90 deg hip flexion:  30 PROM measured in supine 90 deg hip flexion:  40 PROM measured in supine 90 deg hip flexion:  30  ?Hip external rotation PROM measured in supine 90 deg hip flexion: 50 PROM measured in supine 90 deg hip flexion: 40 PROM measured in supine 90 deg hip flexion:  40 PROM measured in supine 90 deg hip flexion:   50  ?Knee flexion        ?Knee extension        ?Ankle dorsiflexion        ?Ankle plantarflexion        ?Ankle inversion        ?Ankle eversion        ? (Blank rows = not tested) ?  ?LE MMT: ?  ?MMT Right ?06/19/2021 Left ?06/19/2021 Left ?07/03/2021 Left ?07/17/21 Right ?07/17/21  ?Hip flexion 5/5 ?  4/5  5/5 5/5  ?Hip extension         ?Hip abduction 3+/5 2+/5 c pain 3+/5 4+/5 4/5  ?Hip adduction         ?  Hip internal rotation         ?Hip external rotation         ?Knee flexion 5/5 5/5     ?Knee extension 5/5 4/5  5/5   ?Ankle dorsiflexion 5/5 5/5     ?Ankle plantarflexion         ?Ankle inversion         ?Ankle eversion         ? (Blank rows = not tested) ?  ?  ?FUNCTIONAL TESTS:  ?07/17/2021: no longer difficulty with sit to stand ? ?Ambulation/Gait ? 07/29/2021 : forward trunk lean, minimal Lt hip extension in stance, decreased stance on Lt ? ?  ?  ?TODAY'S TREATMENT  ?07/29/2021 ?Therapeutic Exercise: ? Aerobic:Nu step L6  X 10 min ?Supine:DKTC stretch with feet on green pball 10 sec X 10. bridge with feet on green pball 5 sec X15 ? piriformis stretch 20 sec X3 ? Leg press 75 lbs 2 x 15 double leg, single leg 43 lbs x 15 bilateral  ? Standing hip abd and ext X 15 bilat with red band with bilateral hand light touch on bar ?Neuromuscular Re-education: ?Manual Therapy: percussive device to Lt/ Rt lateral hip/thigh (glutes, IT band, hamstrings)  ?  ? ?07/24/2021 ?Therapeutic Exercise: ? Aerobic:recumbent bike L3 X 10 min ?Supine:bridge with red band and clam X10 ? piriformis stretch 20 sec X3 ? Leg press 75 lbs 2 x 15 double leg, single leg 37 lbs x 15 bilateral  ? Standing hip abd and ext X 15 bilat with red band with bilateral hand light touch on bar ? Standing lumbar extension x 5 ? ? ?Neuromuscular Re-education: ?Manual Therapy: percussive device to Lt/ Rt lateral hip/thigh (glutes, IT band, hamstrings)  ? ? ? ? ?HOME EXERCISE PROGRAM: ?07/22/21 ?Access Code: [email or web address] ?URL: [email or web address] ?Date:  07/22/2021 ?Prepared by: Elsie Ra ? ?Exercises ?Seated Piriformis Stretch with Trunk Bend - 2 x daily - 6 x weekly - 2 reps - 1 sets - 30 hold ?Seated Lumbar Flexion Stretch - 2 x daily - 6 x weekly - 1 sets - 5

## 2021-07-31 ENCOUNTER — Ambulatory Visit (INDEPENDENT_AMBULATORY_CARE_PROVIDER_SITE_OTHER): Payer: Medicare Other | Admitting: Physical Therapy

## 2021-07-31 ENCOUNTER — Encounter: Payer: Self-pay | Admitting: Physical Therapy

## 2021-07-31 ENCOUNTER — Other Ambulatory Visit: Payer: Self-pay

## 2021-07-31 DIAGNOSIS — R293 Abnormal posture: Secondary | ICD-10-CM | POA: Diagnosis not present

## 2021-07-31 DIAGNOSIS — M6281 Muscle weakness (generalized): Secondary | ICD-10-CM

## 2021-07-31 DIAGNOSIS — M25552 Pain in left hip: Secondary | ICD-10-CM

## 2021-07-31 DIAGNOSIS — M545 Low back pain, unspecified: Secondary | ICD-10-CM

## 2021-07-31 DIAGNOSIS — R262 Difficulty in walking, not elsewhere classified: Secondary | ICD-10-CM

## 2021-07-31 DIAGNOSIS — G8929 Other chronic pain: Secondary | ICD-10-CM

## 2021-07-31 NOTE — Therapy (Signed)
?OUTPATIENT PHYSICAL THERAPY TREATMENT NOTE/PROGRESS NOTE ?Progress Note reporting period 06/19/21 to 07/29/21 ? ?See below for objective and subjective measurements relating to patients progress with PT. ? ? ? ?Patient Name: Angela Pugh ?MRN: [id number] ?DOB:1946-03-07, 76 y.o., female ?Today's Date: 07/31/2021 ? ?PCP: [name], MD ?REFERRING PROVIDER: Jessy Oto, MD ? ? PT End of Session - 07/31/21 0819   ? ? Visit Number 11   ? Number of Visits 20   ? Date for PT Re-Evaluation 08/28/21   ? Authorization Type UHC medicare, AARP   ? Progress Note Due on Visit 20   ? PT Start Time 0800   ? PT Stop Time 0844   ? PT Time Calculation (min) 44 min   ? Activity Tolerance Patient tolerated treatment well   ? Behavior During Therapy [facility] for tasks assessed/performed   ? ?  ?  ? ?  ? ? ? ? ? ? ? ? ?Past Medical History:  ?Diagnosis Date  ? Arthritis   ? back, knees, shoulder, hands , injections in pelvis, for bone spurs (05/04/2016)  ? Chronic back pain   ? Diverticulosis   ? History of kidney stones   ? HLD (hyperlipidemia)   ? Hypertension   ? Migraine   ? hx (05/04/2016)  ? Peripheral neuropathy   ? Radiculopathy   ? Sinus complaint   ? Sinus headache   ? occasional (05/04/2016)  ? Type II diabetes mellitus ([place])   ? diet controlled, no meds now, was on insulin & po treatment at one time    ? Vertigo   ? ?Past Surgical History:  ?Procedure Laterality Date  ? ANTERIOR CERVICAL DECOMP/DISCECTOMY FUSION N/A 04/17/2016  ? Procedure: ANTERIOR CERVICAL DECOMPRESSION/DISCECTOMY FUSION CERVICAL THREE - CERVICAL FOUR, POSTERIOR CERVICAL DISCECTOMY CERVICAL SEVEN -THORASIC ONE LEFT;  Surgeon: Ashok Pall, MD;  Location: [place];  Service: Neurosurgery;  Laterality: N/A;  Anterior/Posterior  ? ANTERIOR FUSION CERVICAL SPINE  2000  ? Dr. Carloyn Manner; "for bone spurs; put hardware in too"  ? BACK SURGERY    ? COLONOSCOPY  06/10/2016  ? [place]  ? LEFT HEART CATH AND CORONARY ANGIOGRAPHY N/A 03/17/2018  ? Procedure: LEFT HEART CATH  AND CORONARY ANGIOGRAPHY;  Surgeon: Lorretta Harp, MD;  Location: [place] [facility];  Service: Cardiovascular;  Laterality: N/A;  ? LUMBAR LAMINECTOMY Left 06/30/2013  ? Procedure: Left L3-4 Extraforaminal approach to excise far lateral herniated nucleus pulposus;  Surgeon: Jessy Oto, MD;  Location: [place];  Service: Orthopedics;  Laterality: Left;  ? TUBAL LIGATION    ? VAGINAL HYSTERECTOMY    ? ?Patient Active Problem List  ? Diagnosis Date Noted  ? Headache above the eye region 02/19/2021  ? Nasal congestion 02/19/2021  ? Emphysema lung ([place]) 10/04/2020  ? Abdominal pain, epigastric 03/18/2020  ? Low back pain without sciatica 10/30/2019  ? Body mass index (BMI) 26.0-26.9, adult 10/23/2019  ? Nonischemic cardiomyopathy ([place]) 04/12/2018  ? Chest pain 01/25/2018  ? Left bundle branch block 01/25/2018  ? Carotid artery disease ([place]) 01/25/2018  ? Seasonal allergies 10/18/2017  ? Arthritis 10/18/2017  ? Pre-diabetes 10/18/2017  ? Protein-calorie malnutrition, severe 05/05/2016  ? Dysphagia 05/04/2016  ? HNP (herniated nucleus pulposus) with myelopathy, cervical 04/17/2016  ? Abdominal pain 02/01/2016  ? Acute diverticulitis 02/01/2016  ? HLD (hyperlipidemia) 02/01/2016  ? Diabetes mellitus without complication ([place]) 57/84/6962  ? Hypokalemia 02/01/2016  ? Hypertension   ? Cervical spondylosis without myelopathy 01/13/2016  ? Bilateral  carpal tunnel syndrome 01/13/2016  ? Herniated lumbar intervertebral disc 06/30/2013  ?  Class: Acute  ? ? ?REFERRING DIAG: M48.07 (ICD-10-CM) - Spinal stenosis of lumbosacral region ?M43.16 (ICD-10-CM) - Spondylolisthesis, lumbar region ?M70.62 (ICD-10-CM) - Trochanteric bursitis, left hip ?M51.36 (ICD-10-CM) - Degenerative disc disease, lumbar ? ?THERAPY DIAG:  ?Chronic bilateral low back pain without sciatica ? ?Pain in left hip ? ?Muscle weakness (generalized) ? ?Abnormal posture ? ?Difficulty in walking, not elsewhere classified ? ? ?SUBJECTIVE:  She says she feels good  today considering she did a lot of walking  ? ?PAIN:  ?Are you having pain? No ?NPRS scale: none today upon arrival ?Pain location: Lt hip/thigh ?Pain orientation: Lt  ?PAIN TYPE: chronic  ?Pain description: achy ?Aggravating factors: morning standing/walking  ?Relieving factors: rest ?  ?PRECAUTIONS: None ?  ?WEIGHT BEARING RESTRICTIONS No ?  ?PATIENT GOALS Reduce pain to do normal life.  ?  ?  ?OBJECTIVE:  ?  ?DIAGNOSTIC FINDINGS:  ?Lumbar spondylolisthesis, DDD L4-5.  MRI ?  ?PATIENT SURVEYS:  ?FOTO 06/19/2021  intake: 38 predicted: 44 ? ?  ?  ?MUSCLE LENGTH: ?06/19/2021: ?Passive SLR Lt 65 degrees ?Passive SLR Rt 75 degrees ?07/29/21 ?Passive SLR Rt now 75 deg and equal to Rt ? ?  ?LUMBARAROM/PROM ?  ?A/PROM A  ?06/19/2021 AROM ?07/24/2021 AROM  ?07/29/21  ?Flexion Movement to mid shin c Lt lumbar/posterior Lt hip pain  90% no pain  ?Extension 25 % WFL c end range pain lumbar.  Repeated x 5 in standing produced similar end range pain.  Marked reduction in lumbar mobility (thoracolumbar junction hinge movement noted) 50% c no pain 50% no pain  ?Right lateral flexion      ?Left lateral flexion      ?Right rotation    50% no pain  ?Left rotation    50% no pain  ? (Blank rows = not tested) ?  ?LE AROM/PROM: ?  ?A/PROM Right ?06/19/2021 Left ?06/19/2021 Left ?07/29/21 Right ?07/29/21  ?Hip flexion        ?Hip extension        ?Hip abduction        ?Hip adduction        ?Hip internal rotation PROM measured in supine 90 deg hip flexion: 25  PROM measured in supine 90 deg hip flexion:  30 PROM measured in supine 90 deg hip flexion:  40 PROM measured in supine 90 deg hip flexion:  30  ?Hip external rotation PROM measured in supine 90 deg hip flexion: 50 PROM measured in supine 90 deg hip flexion: 40 PROM measured in supine 90 deg hip flexion:  40 PROM measured in supine 90 deg hip flexion:  50  ?Knee flexion        ?Knee extension        ?Ankle dorsiflexion        ?Ankle plantarflexion        ?Ankle inversion        ?Ankle eversion         ? (Blank rows = not tested) ?  ?LE MMT: ?  ?MMT Right ?06/19/2021 Left ?06/19/2021 Left ?07/03/2021 Left ?07/17/21 Right ?07/17/21  ?Hip flexion 5/5 ?  4/5  5/5 5/5  ?Hip extension         ?Hip abduction 3+/5 2+/5 c pain 3+/5 4+/5 4/5  ?Hip adduction         ?Hip internal rotation         ?Hip external rotation         ?  Knee flexion 5/5 5/5     ?Knee extension 5/5 4/5  5/5   ?Ankle dorsiflexion 5/5 5/5     ?Ankle plantarflexion         ?Ankle inversion         ?Ankle eversion         ? (Blank rows = not tested) ?  ?  ?FUNCTIONAL TESTS:  ?07/17/2021: no longer difficulty with sit to stand ? ?Ambulation/Gait ? 07/29/2021 : forward trunk lean, minimal Lt hip extension in stance, decreased stance on Lt ? ?  ?  ?TODAY'S TREATMENT  ?07/31/2021 ?Therapeutic Exercise: ? Aerobic:recumbent bike L3 X 10 min ?Supine:bridge  5 sec X15, piriformis stretch 30 sec X3 ? Machines: Leg press 75 lbs 2 x 15 double leg, single leg 43 lbs x 15 bilateral. Leg extension DL 5# 2X15, Hamstring curl DL 20# 2X15 ? Standing: Gastroc stretch 30 sec X 3 on slantboard, bilateral heel and toe raises X 15 ea,  hip abd and ext X 15 bilat with red band with bilateral hand light touch on bar ?Neuromuscular Re-education: ?Manual Therapy: percussive device to Lt/ Rt lateral hip/thigh (glutes, IT band, hamstrings)  ? ?07/29/2021 ?Therapeutic Exercise: ? Aerobic:Nu step L6  X 10 min ?Supine:DKTC stretch with feet on green pball 10 sec X 10. bridge with feet on green pball 5 sec X15 ? piriformis stretch 20 sec X3 ? Leg press 75 lbs 2 x 15 double leg, single leg 43 lbs x 15 bilateral  ? Standing hip abd and ext X 15 bilat with red band with bilateral hand light touch on bar ?Neuromuscular Re-education: ?Manual Therapy: percussive device to Lt/ Rt lateral hip/thigh (glutes, IT band, hamstrings)  ?  ? ?HOME EXERCISE PROGRAM: ?07/22/21 ?Access Code: [email or web address] ?URL: [email or web address] ?Date: 07/22/2021 ?Prepared by: Elsie Ra ? ?Exercises ?Seated Piriformis  Stretch with Trunk Bend - 2 x daily - 6 x weekly - 2 reps - 1 sets - 30 hold ?Seated Lumbar Flexion Stretch - 2 x daily - 6 x weekly - 1 sets - 5 reps - 10 hold ?Supine Lower Trunk Rotation - 2 x daily -

## 2021-08-01 ENCOUNTER — Telehealth: Payer: Medicare Other

## 2021-08-05 ENCOUNTER — Other Ambulatory Visit: Payer: Self-pay

## 2021-08-05 ENCOUNTER — Ambulatory Visit (INDEPENDENT_AMBULATORY_CARE_PROVIDER_SITE_OTHER): Payer: Medicare Other | Admitting: Physical Therapy

## 2021-08-05 ENCOUNTER — Encounter: Payer: Self-pay | Admitting: Physical Therapy

## 2021-08-05 DIAGNOSIS — M6281 Muscle weakness (generalized): Secondary | ICD-10-CM | POA: Diagnosis not present

## 2021-08-05 DIAGNOSIS — R262 Difficulty in walking, not elsewhere classified: Secondary | ICD-10-CM | POA: Diagnosis not present

## 2021-08-05 DIAGNOSIS — M545 Low back pain, unspecified: Secondary | ICD-10-CM | POA: Diagnosis not present

## 2021-08-05 DIAGNOSIS — M25552 Pain in left hip: Secondary | ICD-10-CM

## 2021-08-05 DIAGNOSIS — R293 Abnormal posture: Secondary | ICD-10-CM

## 2021-08-05 DIAGNOSIS — G8929 Other chronic pain: Secondary | ICD-10-CM

## 2021-08-05 NOTE — Therapy (Signed)
?OUTPATIENT PHYSICAL THERAPY TREATMENT NOTE/PROGRESS NOTE ?Progress Note reporting period 06/19/21 to 07/29/21 ? ?See below for objective and subjective measurements relating to patients progress with PT. ? ? ? ?Patient Name: Angela Pugh ?MRN: [id number] ?DOB:Dec 19, 1945, 76 y.o., female ?Today's Date: 08/05/2021 ? ?PCP: [name], MD ?REFERRING PROVIDER: Jessy Oto, MD ? ? PT End of Session - 08/05/21 0824   ? ? Visit Number 12   ? Number of Visits 20   ? Date for PT Re-Evaluation 08/28/21   ? Authorization Type UHC medicare, AARP   ? Progress Note Due on Visit 20   ? PT Start Time 0800   ? PT Stop Time 0845   ? PT Time Calculation (min) 45 min   ? Activity Tolerance Patient tolerated treatment well   ? Behavior During Therapy [facility] for tasks assessed/performed   ? ?  ?  ? ?  ? ? ? ? ? ? ? ? ?Past Medical History:  ?Diagnosis Date  ? Arthritis   ? back, knees, shoulder, hands , injections in pelvis, for bone spurs (05/04/2016)  ? Chronic back pain   ? Diverticulosis   ? History of kidney stones   ? HLD (hyperlipidemia)   ? Hypertension   ? Migraine   ? hx (05/04/2016)  ? Peripheral neuropathy   ? Radiculopathy   ? Sinus complaint   ? Sinus headache   ? occasional (05/04/2016)  ? Type II diabetes mellitus ([place])   ? diet controlled, no meds now, was on insulin & po treatment at one time    ? Vertigo   ? ?Past Surgical History:  ?Procedure Laterality Date  ? ANTERIOR CERVICAL DECOMP/DISCECTOMY FUSION N/A 04/17/2016  ? Procedure: ANTERIOR CERVICAL DECOMPRESSION/DISCECTOMY FUSION CERVICAL THREE - CERVICAL FOUR, POSTERIOR CERVICAL DISCECTOMY CERVICAL SEVEN -THORASIC ONE LEFT;  Surgeon: Ashok Pall, MD;  Location: [place];  Service: Neurosurgery;  Laterality: N/A;  Anterior/Posterior  ? ANTERIOR FUSION CERVICAL SPINE  2000  ? Dr. Carloyn Manner; "for bone spurs; put hardware in too"  ? BACK SURGERY    ? COLONOSCOPY  06/10/2016  ? [facility]  ? LEFT HEART CATH AND CORONARY ANGIOGRAPHY N/A 03/17/2018  ? Procedure: LEFT HEART CATH  AND CORONARY ANGIOGRAPHY;  Surgeon: Lorretta Harp, MD;  Location: [place] [facility];  Service: Cardiovascular;  Laterality: N/A;  ? LUMBAR LAMINECTOMY Left 06/30/2013  ? Procedure: Left L3-4 Extraforaminal approach to excise far lateral herniated nucleus pulposus;  Surgeon: Jessy Oto, MD;  Location: [place];  Service: Orthopedics;  Laterality: Left;  ? TUBAL LIGATION    ? VAGINAL HYSTERECTOMY    ? ?Patient Active Problem List  ? Diagnosis Date Noted  ? Headache above the eye region 02/19/2021  ? Nasal congestion 02/19/2021  ? Emphysema lung ([place]) 10/04/2020  ? Abdominal pain, epigastric 03/18/2020  ? Low back pain without sciatica 10/30/2019  ? Body mass index (BMI) 26.0-26.9, adult 10/23/2019  ? Nonischemic cardiomyopathy ([place]) 04/12/2018  ? Chest pain 01/25/2018  ? Left bundle branch block 01/25/2018  ? Carotid artery disease ([place]) 01/25/2018  ? Seasonal allergies 10/18/2017  ? Arthritis 10/18/2017  ? Pre-diabetes 10/18/2017  ? Protein-calorie malnutrition, severe 05/05/2016  ? Dysphagia 05/04/2016  ? HNP (herniated nucleus pulposus) with myelopathy, cervical 04/17/2016  ? Abdominal pain 02/01/2016  ? Acute diverticulitis 02/01/2016  ? HLD (hyperlipidemia) 02/01/2016  ? Diabetes mellitus without complication ([place]) 67/34/1937  ? Hypokalemia 02/01/2016  ? Hypertension   ? Cervical spondylosis without myelopathy 01/13/2016  ? Bilateral  carpal tunnel syndrome 01/13/2016  ? Herniated lumbar intervertebral disc 06/30/2013  ?  Class: Acute  ? ? ?REFERRING DIAG: M48.07 (ICD-10-CM) - Spinal stenosis of lumbosacral region ?M43.16 (ICD-10-CM) - Spondylolisthesis, lumbar region ?M70.62 (ICD-10-CM) - Trochanteric bursitis, left hip ?M51.36 (ICD-10-CM) - Degenerative disc disease, lumbar ? ?THERAPY DIAG:  ?Chronic bilateral low back pain without sciatica ? ?Pain in left hip ? ?Muscle weakness (generalized) ? ?Abnormal posture ? ?Difficulty in walking, not elsewhere classified ? ? ?SUBJECTIVE:  She says she is not having  pain at the moment and her left leg feels a lot better than it has been ? ?PAIN:  ?Are you having pain? No ?NPRS scale: none today upon arrival ?Pain location: Lt hip/thigh ?Pain orientation: Lt  ?PAIN TYPE: chronic  ?Pain description: achy ?Aggravating factors: morning standing/walking  ?Relieving factors: rest ?  ?PRECAUTIONS: None ?  ?WEIGHT BEARING RESTRICTIONS No ?  ?PATIENT GOALS Reduce pain to do normal life.  ?  ?  ?OBJECTIVE:  ?  ?DIAGNOSTIC FINDINGS:  ?Lumbar spondylolisthesis, DDD L4-5.  MRI ?  ?PATIENT SURVEYS:  ?FOTO 06/19/2021  intake: 38 predicted: 44 ?FOTO 07/29/21 45% functional score and met goal for this ? ?  ?  ?MUSCLE LENGTH: ?06/19/2021: ?Passive SLR Lt 65 degrees ?Passive SLR Rt 75 degrees ?07/29/21 ?Passive SLR Rt now 75 deg and equal to Rt ? ?  ?LUMBARAROM/PROM ?  ?A/PROM A  ?06/19/2021 AROM ?07/24/2021 AROM  ?07/29/21  ?Flexion Movement to mid shin c Lt lumbar/posterior Lt hip pain  90% no pain  ?Extension 25 % WFL c end range pain lumbar.  Repeated x 5 in standing produced similar end range pain.  Marked reduction in lumbar mobility (thoracolumbar junction hinge movement noted) 50% c no pain 50% no pain  ?Right lateral flexion      ?Left lateral flexion      ?Right rotation    50% no pain  ?Left rotation    50% no pain  ? (Blank rows = not tested) ?  ?LE AROM/PROM: ?  ?A/PROM Right ?06/19/2021 Left ?06/19/2021 Left ?07/29/21 Right ?07/29/21  ?Hip flexion        ?Hip extension        ?Hip abduction        ?Hip adduction        ?Hip internal rotation PROM measured in supine 90 deg hip flexion: 25  PROM measured in supine 90 deg hip flexion:  30 PROM measured in supine 90 deg hip flexion:  40 PROM measured in supine 90 deg hip flexion:  30  ?Hip external rotation PROM measured in supine 90 deg hip flexion: 50 PROM measured in supine 90 deg hip flexion: 40 PROM measured in supine 90 deg hip flexion:  40 PROM measured in supine 90 deg hip flexion:  50  ?Knee flexion        ?Knee extension        ?Ankle  dorsiflexion        ?Ankle plantarflexion        ?Ankle inversion        ?Ankle eversion        ? (Blank rows = not tested) ?  ?LE MMT: ?  ?MMT Right ?06/19/2021 Left ?06/19/2021 Left ?07/03/2021 Left ?07/17/21 Right ?07/17/21  ?Hip flexion 5/5 ?  4/5  5/5 5/5  ?Hip extension         ?Hip abduction 3+/5 2+/5 c pain 3+/5 4+/5 4/5  ?Hip adduction         ?  Hip internal rotation         ?Hip external rotation         ?Knee flexion 5/5 5/5     ?Knee extension 5/5 4/5  5/5   ?Ankle dorsiflexion 5/5 5/5     ?Ankle plantarflexion         ?Ankle inversion         ?Ankle eversion         ? (Blank rows = not tested) ?  ?  ?FUNCTIONAL TESTS:  ?07/17/2021: no longer difficulty with sit to stand ? ?Ambulation/Gait ? 07/29/2021 : forward trunk lean, minimal Lt hip extension in stance, decreased stance on Lt ? ?  ?  ?TODAY'S TREATMENT  ?08/05/2021 ?Therapeutic Exercise: ? Aerobic:recumbent bike L4 X 10 min ?Supine:bridge  piriformis stretch 30 sec X2 on Rt and X 3 on Lt ? Machines: Leg press 81 lbs 2 x 15 double leg, single leg 50 lbs x 15 bilateral. Leg extension DL 10# 2X10, Hamstring curl DL 25# 3X10 ? Standing: Gastroc stretch 30 sec X 3 on slantboard, bilateral heel and toe raises X 15 ea, Step ups fwd 6inch X 15 bilat with one UE support. hip abd and ext X 15 bilat with red band with bilateral hand light touch on bar ?Neuromuscular Re-education: ?Manual Therapy: percussive device to Lt/ Rt lateral hip/thigh (glutes, IT band, hamstrings)  ? ?07/31/2021 ?Therapeutic Exercise: ? Aerobic:recumbent bike L3 X 10 min ?Supine:bridge  5 sec X15, piriformis stretch 30 sec X3 ? Machines: Leg press 75 lbs 2 x 15 double leg, single leg 43 lbs x 15 bilateral. Leg extension DL 5# 2X15, Hamstring curl DL 20# 2X15 ? Standing: Gastroc stretch 30 sec X 3 on slantboard, bilateral heel and toe raises X 15 ea,  hip abd and ext X 15 bilat with red band with bilateral hand light touch on bar ?Neuromuscular Re-education: ?Manual Therapy: percussive device to Lt/  Rt lateral hip/thigh (glutes, IT band, hamstrings)  ? ?  ? ?HOME EXERCISE PROGRAM: ?07/22/21 ?Access Code: [email or web address] ?URL: [email or web address] ?Date: 07/22/2021 ?Prepared by: Elsie Ra ? ?Exercis

## 2021-08-07 ENCOUNTER — Encounter: Payer: Medicare Other | Admitting: Physical Therapy

## 2021-08-12 ENCOUNTER — Encounter: Payer: Self-pay | Admitting: Physical Therapy

## 2021-08-12 ENCOUNTER — Other Ambulatory Visit: Payer: Self-pay

## 2021-08-12 ENCOUNTER — Ambulatory Visit (INDEPENDENT_AMBULATORY_CARE_PROVIDER_SITE_OTHER): Payer: Medicare Other | Admitting: Physical Therapy

## 2021-08-12 DIAGNOSIS — M545 Low back pain, unspecified: Secondary | ICD-10-CM | POA: Diagnosis not present

## 2021-08-12 DIAGNOSIS — R293 Abnormal posture: Secondary | ICD-10-CM | POA: Diagnosis not present

## 2021-08-12 DIAGNOSIS — G8929 Other chronic pain: Secondary | ICD-10-CM | POA: Diagnosis not present

## 2021-08-12 DIAGNOSIS — M6281 Muscle weakness (generalized): Secondary | ICD-10-CM | POA: Diagnosis not present

## 2021-08-12 DIAGNOSIS — R262 Difficulty in walking, not elsewhere classified: Secondary | ICD-10-CM | POA: Diagnosis not present

## 2021-08-12 DIAGNOSIS — M25552 Pain in left hip: Secondary | ICD-10-CM | POA: Diagnosis not present

## 2021-08-12 NOTE — Therapy (Signed)
?OUTPATIENT PHYSICAL THERAPY TREATMENT NOTE/PROGRESS NOTE ?Progress Note reporting period 06/19/21 to 07/29/21 ? ?See below for objective and subjective measurements relating to patients progress with PT. ? ? ? ?Patient Name: Angela Pugh ?MRN: [id number] ?DOB:06/06/1945, 75 y.o., female ?Today's Date: 08/12/2021 ? ?PCP: Banks, Shannon R, MD ?REFERRING PROVIDER: Nitka, James E, MD ? ? PT End of Session - 08/12/21 0812   ? ? Visit Number 13   ? Number of Visits 20   ? Date for PT Re-Evaluation 08/28/21   ? Authorization Type UHC medicare, AARP   ? Progress Note Due on Visit 20   ? PT Start Time 0803   ? PT Stop Time 0845   ? PT Time Calculation (min) 42 min   ? Activity Tolerance Patient tolerated treatment well   ? Behavior During Therapy [facility] for tasks assessed/performed   ? ?  ?  ? ?  ? ? ? ? ? ? ? ? ?Past Medical History:  ?Diagnosis Date  ? Arthritis   ? back, knees, shoulder, hands , injections in pelvis, for bone spurs (05/04/2016)  ? Chronic back pain   ? Diverticulosis   ? History of kidney stones   ? HLD (hyperlipidemia)   ? Hypertension   ? Migraine   ? hx (05/04/2016)  ? Peripheral neuropathy   ? Radiculopathy   ? Sinus complaint   ? Sinus headache   ? occasional (05/04/2016)  ? Type II diabetes mellitus ([facility])   ? diet controlled, no meds now, was on insulin & po treatment at one time    ? Vertigo   ? ?Past Surgical History:  ?Procedure Laterality Date  ? ANTERIOR CERVICAL DECOMP/DISCECTOMY FUSION N/A 04/17/2016  ? Procedure: ANTERIOR CERVICAL DECOMPRESSION/DISCECTOMY FUSION CERVICAL THREE - CERVICAL FOUR, POSTERIOR CERVICAL DISCECTOMY CERVICAL SEVEN -THORASIC ONE LEFT;  Surgeon: Kyle Cabbell, MD;  Location: [facility];  Service: Neurosurgery;  Laterality: N/A;  Anterior/Posterior  ? ANTERIOR FUSION CERVICAL SPINE  2000  ? Dr. Roy; "for bone spurs; put hardware in too"  ? BACK SURGERY    ? COLONOSCOPY  06/10/2016  ? [facility]  ? LEFT HEART CATH AND CORONARY ANGIOGRAPHY N/A 03/17/2018  ? Procedure: LEFT HEART CATH  AND CORONARY ANGIOGRAPHY;  Surgeon: Berry, Jonathan J, MD;  Location: [facility];  Service: Cardiovascular;  Laterality: N/A;  ? LUMBAR LAMINECTOMY Left 06/30/2013  ? Procedure: Left L3-4 Extraforaminal approach to excise far lateral herniated nucleus pulposus;  Surgeon: James E Nitka, MD;  Location: [facility];  Service: Orthopedics;  Laterality: Left;  ? TUBAL LIGATION    ? VAGINAL HYSTERECTOMY    ? ?Patient Active Problem List  ? Diagnosis Date Noted  ? Headache above the eye region 02/19/2021  ? Nasal congestion 02/19/2021  ? Emphysema lung ([facility]) 10/04/2020  ? Abdominal pain, epigastric 03/18/2020  ? Low back pain without sciatica 10/30/2019  ? Body mass index (BMI) 26.0-26.9, adult 10/23/2019  ? Nonischemic cardiomyopathy ([facility]) 04/12/2018  ? Chest pain 01/25/2018  ? Left bundle branch block 01/25/2018  ? Carotid artery disease ([facility]) 01/25/2018  ? Seasonal allergies 10/18/2017  ? Arthritis 10/18/2017  ? Pre-diabetes 10/18/2017  ? Protein-calorie malnutrition, severe 05/05/2016  ? Dysphagia 05/04/2016  ? HNP (herniated nucleus pulposus) with myelopathy, cervical 04/17/2016  ? Abdominal pain 02/01/2016  ? Acute diverticulitis 02/01/2016  ? HLD (hyperlipidemia) 02/01/2016  ? Diabetes mellitus without complication ([facility]) 02/01/2016  ? Hypokalemia 02/01/2016  ? Hypertension   ? Cervical spondylosis without myelopathy 01/13/2016  ? Bilateral   carpal tunnel syndrome 01/13/2016  ? Herniated lumbar intervertebral disc 06/30/2013  ?  Class: Acute  ? ? ?REFERRING DIAG: M48.07 (ICD-10-CM) - Spinal stenosis of lumbosacral region ?M43.16 (ICD-10-CM) - Spondylolisthesis, lumbar region ?M70.62 (ICD-10-CM) - Trochanteric bursitis, left hip ?M51.36 (ICD-10-CM) - Degenerative disc disease, lumbar ? ?THERAPY DIAG:  ?Chronic bilateral low back pain without sciatica ? ?Pain in left hip ? ?Muscle weakness (generalized) ? ?Abnormal posture ? ?Difficulty in walking, not elsewhere classified ? ? ?SUBJECTIVE:  She says some soreness more  than pain in her left hip today ? ?PAIN:  ?Are you having pain? yes ?NPRS scale: 2/10 soreness ?Pain location: Lt hip/thigh ?Pain orientation: Lt  ?PAIN TYPE: chronic  ?Pain description: achy ?Aggravating factors: morning standing/walking  ?Relieving factors: rest ?  ?PRECAUTIONS: None ?  ?WEIGHT BEARING RESTRICTIONS No ?  ?PATIENT GOALS Reduce pain to do normal life.  ?  ?  ?OBJECTIVE:  ?  ?DIAGNOSTIC FINDINGS:  ?Lumbar spondylolisthesis, DDD L4-5.  MRI ?  ?PATIENT SURVEYS:  ?FOTO 06/19/2021  intake: 38 predicted: 44 ?FOTO 07/29/21 45% functional score and met goal for this ? ?  ?  ?MUSCLE LENGTH: ?06/19/2021: ?Passive SLR Lt 65 degrees ?Passive SLR Rt 75 degrees ?07/29/21 ?Passive SLR Rt now 75 deg and equal to Rt ? ?  ?LUMBARAROM/PROM ?  ?A/PROM A  ?06/19/2021 AROM ?07/24/2021 AROM  ?07/29/21  ?Flexion Movement to mid shin c Lt lumbar/posterior Lt hip pain  90% no pain  ?Extension 25 % WFL c end range pain lumbar.  Repeated x 5 in standing produced similar end range pain.  Marked reduction in lumbar mobility (thoracolumbar junction hinge movement noted) 50% c no pain 50% no pain  ?Right lateral flexion      ?Left lateral flexion      ?Right rotation    50% no pain  ?Left rotation    50% no pain  ? (Blank rows = not tested) ?  ?LE AROM/PROM: ?  ?A/PROM Right ?06/19/2021 Left ?06/19/2021 Left ?07/29/21 Right ?07/29/21  ?Hip flexion        ?Hip extension        ?Hip abduction        ?Hip adduction        ?Hip internal rotation PROM measured in supine 90 deg hip flexion: 25  PROM measured in supine 90 deg hip flexion:  30 PROM measured in supine 90 deg hip flexion:  40 PROM measured in supine 90 deg hip flexion:  30  ?Hip external rotation PROM measured in supine 90 deg hip flexion: 50 PROM measured in supine 90 deg hip flexion: 40 PROM measured in supine 90 deg hip flexion:  40 PROM measured in supine 90 deg hip flexion:  50  ?Knee flexion        ?Knee extension        ?Ankle dorsiflexion        ?Ankle plantarflexion        ?Ankle  inversion        ?Ankle eversion        ? (Blank rows = not tested) ?  ?LE MMT: ?  ?MMT Right ?06/19/2021 Left ?06/19/2021 Left ?07/03/2021 Left ?07/17/21 Right ?07/17/21  ?Hip flexion 5/5 ?  4/5  5/5 5/5  ?Hip extension         ?Hip abduction 3+/5 2+/5 c pain 3+/5 4+/5 4/5  ?Hip adduction         ?Hip internal rotation         ?Hip   external rotation         ?Knee flexion 5/5 5/5     ?Knee extension 5/5 4/5  5/5   ?Ankle dorsiflexion 5/5 5/5     ?Ankle plantarflexion         ?Ankle inversion         ?Ankle eversion         ? (Blank rows = not tested) ?  ?  ?FUNCTIONAL TESTS:  ?07/17/2021: no longer difficulty with sit to stand ? ?Ambulation/Gait ? 07/29/2021 : forward trunk lean, minimal Lt hip extension in stance, decreased stance on Lt ? ?  ?  ?TODAY'S TREATMENT  ?08/12/2021 ?Therapeutic Exercise: ?Aerobic: Nu step X 8 min L7 UE/LE ?Supine:bridge 2X10 holding 5 sec,  piriformis stretch 30 sec X2 on Rt and X 3 on Lt ?Machines: Leg press 87 lbs 3 x 10 double leg, single leg 50 lbs x 15 bilateral.  ?Standing: Gastroc stretch 30 sec X 3 on slantboard,  Step ups fwd 6inch X 15 bilat without UE support, then lateral step ups 6 inch no UE support X 10 bilat.  Lateral stepping and monster walking with green band around ankles 20 feet X 4 ea. ?Manual Therapy: percussive device to Lt/ Rt lateral hip/thigh (glutes, IT band, hamstrings)  ? ?08/05/2021 ?Therapeutic Exercise: ? Aerobic:recumbent bike L4 X 10 min ?Supine:bridge  piriformis stretch 30 sec X2 on Rt and X 3 on Lt ? Machines: Leg press 81 lbs 2 x 15 double leg, single leg 50 lbs x 15 bilateral. Leg extension DL 10# 2X10, Hamstring curl DL 25# 3X10 ? Standing: Gastroc stretch 30 sec X 3 on slantboard, bilateral heel and toe raises X 15 ea, Step ups fwd 6inch X 15 bilat with one UE support. hip abd and ext X 15 bilat with red band with bilateral hand light touch on bar ?Neuromuscular Re-education: ?Manual Therapy: percussive device to Lt/ Rt lateral hip/thigh (glutes, IT band,  hamstrings)  ? ?  ? ?HOME EXERCISE PROGRAM: ?Access Code: [email or web address] ?URL: [email or web address] ?Date: 08/12/2021 ?Prepared by:   ? ?Exercises ?- Hooklying Single Knee to Chest Stret

## 2021-08-14 ENCOUNTER — Encounter: Payer: Medicare Other | Admitting: Physical Therapy

## 2021-08-14 ENCOUNTER — Telehealth: Payer: Self-pay | Admitting: Physical Therapy

## 2021-08-14 NOTE — Telephone Encounter (Signed)
Pt did not show for PT appointment today. They were contacted and informed of this via voicemail. They called back stating they had car trouble.  ? ?Elsie Ra, PT, DPT ?08/14/21 9:55 AM ? ?

## 2021-08-26 ENCOUNTER — Encounter: Payer: Self-pay | Admitting: Podiatry

## 2021-08-26 ENCOUNTER — Ambulatory Visit (INDEPENDENT_AMBULATORY_CARE_PROVIDER_SITE_OTHER): Payer: Medicare Other | Admitting: Podiatry

## 2021-08-26 DIAGNOSIS — E1151 Type 2 diabetes mellitus with diabetic peripheral angiopathy without gangrene: Secondary | ICD-10-CM | POA: Diagnosis not present

## 2021-08-26 DIAGNOSIS — Q828 Other specified congenital malformations of skin: Secondary | ICD-10-CM | POA: Diagnosis not present

## 2021-08-26 DIAGNOSIS — M79675 Pain in left toe(s): Secondary | ICD-10-CM | POA: Diagnosis not present

## 2021-08-26 DIAGNOSIS — B351 Tinea unguium: Secondary | ICD-10-CM | POA: Diagnosis not present

## 2021-08-26 DIAGNOSIS — M79674 Pain in right toe(s): Secondary | ICD-10-CM

## 2021-08-31 NOTE — Progress Notes (Signed)
?  Subjective:  ?Patient ID: Angela Pugh, female    DOB: 1945/08/08,  MRN: [id number] ? ?Angela Pugh presents to clinic today for at risk foot care. Pt has h/o NIDDM with PAD and painful porokeratotic lesion(s) bilaterally and painful mycotic toenails that limit ambulation. Painful toenails interfere with ambulation. Aggravating factors include wearing enclosed shoe gear. Pain is relieved with periodic professional debridement. Painful porokeratotic lesions are aggravated when weightbearing with and without shoegear. Pain is relieved with periodic professional debridement. ? ?Patient did not check blood glucose today. ? ?New problem(s): Patient states right 3rd digit is tender on today's visit.  She relates no redness, drainage or swelling, but it is sore to touch and when wearing enclosed shoe gear. ? ?PCP is [name], MD , and last visit was December 04, 2020. ? ?Allergies  ?Allergen Reactions  ? Acetaminophen Hives and Nausea Only  ? Azithromycin Hives and Nausea And Vomiting  ? Lipitor [Atorvastatin] Nausea Only  ?  weakness  ? Zyrtec [Cetirizine]   ?  Hives ?  ? ? ?Review of Systems: Negative except as noted in the HPI. ? ?Objective: No changes noted in today's physical examination. ? ?[name] is a pleasant 76 y.o. female in NAD. AAO X 3. ? ?Vascular Examination: ?CFT <3 seconds b/l LE. Palpable DP pulse(s) right lower extremity Faintly palpable PT pulse(s) b/l LE. Diminished DP pulse(s) left lower extremity. Pedal hair sparse. No pain with calf compression b/l. Lower extremity skin temperature gradient within normal limits. No edema noted b/l LE. No ischemia or gangrene noted b/l LE. No cyanosis or clubbing noted b/l LE. ? ?Dermatological Examination: ?Pedal integument with normal turgor, texture and tone b/l LE. No open wounds b/l. No interdigital macerations b/l. Toenails 1-5 b/l elongated, thickened, discolored with subungual debris. +Tenderness with dorsal palpation of  nailplates. Incurvated nailplate R 3rd toe with tenderness to palpation. No erythema, no edema, no drainage noted. No fluctuance. Porokeratotic lesion(s) noted plantar heel pad of right foot, posterolateral aspect of right heel, and submet head 2 b/l. ? ?Musculoskeletal Examination: ?Normal muscle strength 5/5 to all lower extremity muscle groups bilaterally. Hammertoe deformity noted 2-5 b/l.Marland Kitchen No pain, crepitus or joint limitation noted with ROM b/l LE.  Patient ambulates independently without assistive aids. ? ?Neurological Examination: ?Pt has subjective symptoms of neuropathy. Protective sensation intact 5/5 intact bilaterally with 10g monofilament b/l. Vibratory sensation intact b/l. Clonus negative b/l. ? ? ?  Latest Ref Rng & Units 10/03/2020  ?  9:43 AM  ?Hemoglobin A1C  ?Hemoglobin-A1c 4.6 - 6.5 % 6.3    ? ?Assessment/Plan: ?1. Pain due to onychomycosis of toenails of both feet   ?2. Porokeratosis   ?3. Type II diabetes mellitus with peripheral circulatory disorder (HCC)   ?  ? ?-Toenails 1-5 b/l were debrided in length and girth with sterile nail nippers and dremel without iatrogenic bleeding.  ?-Offending nail border debrided and curretaged R 3rd toe utilizing sterile nail nipper and currette. Border cleansed with alcohol and triple antibiotic applied. No further treatment required by patient/caregiver. ?-Painful porokeratotic lesion(s) plantar heel pad of right foot, posterolateral aspect of right heel, and submet head 2 b/l pared and enucleated with sterile scalpel blade without incident. Total number of lesions debrided=4. ?-Patient/POA to call should there be question/concern in the interim.  ? ?Return in about 3 months (around 11/25/2021). ? ?Marzetta Board, DPM  ?

## 2021-09-02 ENCOUNTER — Encounter: Payer: Self-pay | Admitting: Physical Therapy

## 2021-09-02 ENCOUNTER — Ambulatory Visit (INDEPENDENT_AMBULATORY_CARE_PROVIDER_SITE_OTHER): Payer: Medicare Other | Admitting: Physical Therapy

## 2021-09-02 DIAGNOSIS — R262 Difficulty in walking, not elsewhere classified: Secondary | ICD-10-CM | POA: Diagnosis not present

## 2021-09-02 DIAGNOSIS — G8929 Other chronic pain: Secondary | ICD-10-CM | POA: Diagnosis not present

## 2021-09-02 DIAGNOSIS — M6281 Muscle weakness (generalized): Secondary | ICD-10-CM | POA: Diagnosis not present

## 2021-09-02 DIAGNOSIS — M545 Low back pain, unspecified: Secondary | ICD-10-CM | POA: Diagnosis not present

## 2021-09-02 DIAGNOSIS — M25552 Pain in left hip: Secondary | ICD-10-CM | POA: Diagnosis not present

## 2021-09-02 DIAGNOSIS — R293 Abnormal posture: Secondary | ICD-10-CM

## 2021-09-02 NOTE — Therapy (Signed)
?OUTPATIENT PHYSICAL THERAPY TREATMENT NOTE/Discharge ?PHYSICAL THERAPY DISCHARGE SUMMARY ? ?Visits from Start of Care: 14 ? ?Current functional level related to goals / functional outcomes: ?See below ?  ?Remaining deficits: ?See below ?  ?Education / Equipment: ?See below  ?Plan: ?Patient agrees to discharge.  Patient goals were not met. Patient is being discharged due to meeting the stated rehab goals and being at a good functional level.  ? ? ? ? ? ? ?Patient Name: Angela Pugh ?MRN: [id number] ?DOB:[birth year], 76 y.o., female ?Today's Date: 09/02/2021 ? ?PCP: [name], MD ?REFERRING PROVIDER: Jessy Oto, MD ? ? PT End of Session - 09/02/21 0859   ? ? Visit Number 14   ? Number of Visits 20   ? Date for PT Re-Evaluation 09/02/21  ? Authorization Type [employer] medicare, [employer]   ? Progress Note Due on Visit 20   ? PT Start Time [phone]   ? PT Stop Time 0930   ? PT Time Calculation (min) 40 min   ? Activity Tolerance Patient tolerated treatment well   ? Behavior During Therapy [facility] for tasks assessed/performed   ? ?  ?  ? ?  ? ? ? ? ? ? ? ? ?Past Medical History:  ?Diagnosis Date  ? Arthritis   ? back, knees, shoulder, hands , injections in pelvis, for bone spurs (05/04/2016)  ? Chronic back pain   ? Diverticulosis   ? History of kidney stones   ? HLD (hyperlipidemia)   ? Hypertension   ? Migraine   ? hx (05/04/2016)  ? Peripheral neuropathy   ? Radiculopathy   ? Sinus complaint   ? Sinus headache   ? occasional (05/04/2016)  ? Type II diabetes mellitus ([place])   ? diet controlled, no meds now, was on insulin & po treatment at one time    ? Vertigo   ? ?Past Surgical History:  ?Procedure Laterality Date  ? ANTERIOR CERVICAL DECOMP/DISCECTOMY FUSION N/A 04/17/2016  ? Procedure: ANTERIOR CERVICAL DECOMPRESSION/DISCECTOMY FUSION CERVICAL THREE - CERVICAL FOUR, POSTERIOR CERVICAL DISCECTOMY CERVICAL SEVEN -THORASIC ONE LEFT;  Surgeon: Ashok Pall, MD;  Location: [place];  Service: Neurosurgery;  Laterality: N/A;   Anterior/Posterior  ? ANTERIOR FUSION CERVICAL SPINE  2000  ? Dr. Carloyn Manner; "for bone spurs; put hardware in too"  ? BACK SURGERY    ? COLONOSCOPY  06/10/2016  ? [place]  ? LEFT HEART CATH AND CORONARY ANGIOGRAPHY N/A 03/17/2018  ? Procedure: LEFT HEART CATH AND CORONARY ANGIOGRAPHY;  Surgeon: Lorretta Harp, MD;  Location: [place] [facility];  Service: Cardiovascular;  Laterality: N/A;  ? LUMBAR LAMINECTOMY Left 06/30/2013  ? Procedure: Left L3-4 Extraforaminal approach to excise far lateral herniated nucleus pulposus;  Surgeon: Jessy Oto, MD;  Location: [place];  Service: Orthopedics;  Laterality: Left;  ? TUBAL LIGATION    ? VAGINAL HYSTERECTOMY    ? ?Patient Active Problem List  ? Diagnosis Date Noted  ? Headache above the eye region 02/19/2021  ? Nasal congestion 02/19/2021  ? Emphysema lung ([place]) 10/04/2020  ? Abdominal pain, epigastric 03/18/2020  ? Low back pain without sciatica 10/30/2019  ? Body mass index (BMI) 26.0-26.9, adult 10/23/2019  ? Nonischemic cardiomyopathy ([place]) 04/12/2018  ? Chest pain 01/25/2018  ? Left bundle branch block 01/25/2018  ? Carotid artery disease ([place]) 01/25/2018  ? Seasonal allergies 10/18/2017  ? Arthritis 10/18/2017  ? Pre-diabetes 10/18/2017  ? Protein-calorie malnutrition, severe 05/05/2016  ? Dysphagia 05/04/2016  ? HNP (herniated  nucleus pulposus) with myelopathy, cervical 04/17/2016  ? Abdominal pain 02/01/2016  ? Acute diverticulitis 02/01/2016  ? HLD (hyperlipidemia) 02/01/2016  ? Diabetes mellitus without complication ([place]) 78/24/2353  ? Hypokalemia 02/01/2016  ? Hypertension   ? Cervical spondylosis without myelopathy 01/13/2016  ? Bilateral carpal tunnel syndrome 01/13/2016  ? Herniated lumbar intervertebral disc 06/30/2013  ?  Class: Acute  ? ? ?REFERRING DIAG: M48.07 (ICD-10-CM) - Spinal stenosis of lumbosacral region ?M43.16 (ICD-10-CM) - Spondylolisthesis, lumbar region ?M70.62 (ICD-10-CM) - Trochanteric bursitis, left hip ?M51.36 (ICD-10-CM) - Degenerative disc  disease, lumbar ? ?THERAPY DIAG:  ?Chronic bilateral low back pain without sciatica ? ?Pain in left hip ? ?Muscle weakness (generalized) ? ?Abnormal posture ? ?Difficulty in walking, not elsewhere classified ? ? ?SUBJECTIVE:  She relays her back has been feeling good lately. She feels ready to discharge today ? ?PAIN:  ?Are you having pain?yes ?NPRS scale: 1/10 soreness ?Pain location: Lt hip/thigh ?Pain orientation: Lt  ?PAIN TYPE: chronic  ?Pain description: achy ?Aggravating factors: morning standing/walking  ?Relieving factors: rest ?  ?PRECAUTIONS: None ?  ?WEIGHT BEARING RESTRICTIONS No ?  ?PATIENT GOALS Reduce pain to do normal life.  ?  ?  ?OBJECTIVE:  ?  ?DIAGNOSTIC FINDINGS:  ?Lumbar spondylolisthesis, DDD L4-5.  MRI ?  ?PATIENT SURVEYS:  ?FOTO 06/19/2021  intake: 38 predicted: 44 ?FOTO 07/29/21 45% functional score and met goal for this ?FOTO 09/02/21: 50% functional score and met goal  ? ?  ?  ?MUSCLE LENGTH: ?06/19/2021: ?Passive SLR Lt 65 degrees ?Passive SLR Rt 75 degrees ?07/29/21 ?Passive SLR Rt now 75 deg and equal to Rt ? ?  ?LUMBARAROM/PROM ?  ?A/PROM A  ?06/19/2021 AROM ?07/24/2021 AROM  ?07/29/21 AROM ?09/02/21  ?Flexion Movement to mid shin c Lt lumbar/posterior Lt hip pain  90% no pain WNL no pain  ?Extension 25 % WFL c end range pain lumbar.  Repeated x 5 in standing produced similar end range pain.  Marked reduction in lumbar mobility (thoracolumbar junction hinge movement noted) 50% c no pain 50% no pain WFL no pain  ?Right lateral flexion       ?Left lateral flexion       ?Right rotation    50% no pain WFL no pain  ?Left rotation    50% no pain WFL no pain  ? (Blank rows = not tested) ?  ?LE AROM/PROM: ?  ?A/PROM Right ?06/19/2021 Left ?06/19/2021 Left ?07/29/21 Right ?07/29/21   ?Hip flexion         ?Hip extension         ?Hip abduction         ?Hip adduction         ?Hip internal rotation PROM measured in supine 90 deg hip flexion: 25  PROM measured in supine 90 deg hip flexion:  30 PROM measured in  supine 90 deg hip flexion:  40 PROM measured in supine 90 deg hip flexion:  30   ?Hip external rotation PROM measured in supine 90 deg hip flexion: 50 PROM measured in supine 90 deg hip flexion: 40 PROM measured in supine 90 deg hip flexion:  40 PROM measured in supine 90 deg hip flexion:  50   ?Knee flexion         ?Knee extension         ?Ankle dorsiflexion         ?Ankle plantarflexion         ?Ankle inversion         ?  Ankle eversion         ? (Blank rows = not tested) ?  ?LE MMT: ?  ?MMT Right ?06/19/2021 Left ?06/19/2021 Left ?07/03/2021 Left ?07/17/21 Right ?07/17/21 Bilat tested in sitting ?08/28/21  ?Hip flexion 5/5 ?  4/5  5/5 5/5 5  ?Hip extension          ?Hip abduction 3+/5 2+/5 c pain 3+/5 4+/5 4/5 5  ?Hip adduction          ?Hip internal rotation          ?Hip external rotation          ?Knee flexion 5/5 5/5    5  ?Knee extension 5/5 4/5  5/5  5  ?Ankle dorsiflexion 5/5 5/5    5  ?Ankle plantarflexion          ?Ankle inversion          ?Ankle eversion          ? (Blank rows = not tested) ?  ?  ?FUNCTIONAL TESTS:  ?07/17/2021: no longer difficulty with sit to stand ? ?Ambulation/Gait ? 07/29/2021 : forward trunk lean, minimal Lt hip extension in stance, decreased stance on Lt ? ?  ?  ?TODAY'S TREATMENT  ?09/02/2021 ?Therapeutic Exercise: ?Aerobic: Nu step X 8 min L7 UE/LE ?Supine:bridge 2X10 holding 5 sec, SLR X 15 bilat, SKTC stretch 30 sec X 2 bilat ?Standing: lateral walk and monster walk with red band at ankles 4 round trips ea at counter top, Gastroc stretch 30 sec X 3 ?Manual Therapy: percussive device to Lt/ Rt lateral hip/thigh (glutes, IT band, hamstrings)  ? ?  ? ?HOME EXERCISE PROGRAM: ?Access Code: [email or web address] ?URL: [email or web address] ?Date: 09/02/2021 ?Prepared by: Elsie Ra ? ?Exercises ?- Hooklying Single Knee to Chest Stretch  - 1 x daily - 3 x weekly - 1 sets - 2-3 reps - 30 hold ?- Supine Straight Leg Raises  - 1 x daily - 3 x weekly - 1-2 sets - 10 reps ?- Bridge with Abduction and  Resistance Loop  - 1 x daily - 3 x weekly - 2-3 sets - 10 reps ?- Seated Piriformis Stretch with Trunk Bend  - 1 x daily - 3 x weekly - 1 sets - 2 reps - 30 hold ?- Seated Lumbar Flexion Stretch  - 1 x daily -

## 2021-09-11 ENCOUNTER — Telehealth: Payer: Self-pay | Admitting: Family Medicine

## 2021-09-11 ENCOUNTER — Encounter: Payer: Medicare Other | Admitting: Rehabilitative and Restorative Service Providers"

## 2021-09-11 NOTE — Telephone Encounter (Signed)
That's fine

## 2021-09-11 NOTE — Telephone Encounter (Signed)
Pt refill requested  olmesartan (BENICAR) 5 MG tablet   ?[id number] - [place], [place] Phone:  [phone]  ?Fax:  [phone]  ?  ? ? ?

## 2021-09-12 MED ORDER — OLMESARTAN MEDOXOMIL 5 MG PO TABS: 10.0000 mg | ORAL_TABLET | Freq: Every day | ORAL | 3 refills | Status: DC

## 2021-09-12 NOTE — Telephone Encounter (Signed)
Refill sent to pharmacy.   

## 2021-09-16 ENCOUNTER — Encounter: Payer: Medicare Other | Admitting: Rehabilitative and Restorative Service Providers"

## 2021-10-08 ENCOUNTER — Encounter: Payer: Self-pay | Admitting: Specialist

## 2021-10-08 ENCOUNTER — Ambulatory Visit: Payer: Medicare Other | Admitting: Specialist

## 2021-10-08 VITALS — BP 166/75 | HR 55 | Ht 66.0 in | Wt 167.0 lb

## 2021-10-08 DIAGNOSIS — M7062 Trochanteric bursitis, left hip: Secondary | ICD-10-CM | POA: Diagnosis not present

## 2021-10-08 DIAGNOSIS — M4156 Other secondary scoliosis, lumbar region: Secondary | ICD-10-CM

## 2021-10-08 DIAGNOSIS — M25552 Pain in left hip: Secondary | ICD-10-CM | POA: Diagnosis not present

## 2021-10-08 DIAGNOSIS — M4316 Spondylolisthesis, lumbar region: Secondary | ICD-10-CM | POA: Diagnosis not present

## 2021-10-08 DIAGNOSIS — M5416 Radiculopathy, lumbar region: Secondary | ICD-10-CM

## 2021-10-08 NOTE — Progress Notes (Signed)
Office Visit Note   Patient: Angela Pugh           Date of Birth: 1946/03/24           MRN: [id number] Visit Date: 10/08/2021              Requested by: [name], MD [place],  [place] [address] PCP: [name], MD   Assessment & Plan: Visit Diagnoses:  1. Pain in left hip   2. Trochanteric bursitis, left hip   3. Spondylolisthesis, lumbar region   4. Lumbar radiculopathy   5. Other secondary scoliosis, lumbar region     Plan: Avoid bending, stooping and avoid lifting weights greater than 10 lbs. Avoid prolong standing and walking. Avoid frequent bending and stooping  No lifting greater than 10 lbs. May use ice or moist heat for pain. Weight loss is of benefit. Handicap license is approved. Dr. Romona Curls secretary/Assistant will call to arrange for epidural steroid injection   Left hip trochanteric bursitis, left hip stretching exercises help. Ice or heat Use of arthritis medication motrin or naprosyn is helpful. The hips are starting to become symptomatic and your ability to walk and grocery shop suggests that you are functioning at a high level. Surgery is generally a consideration when The level of function decreases and you can expect to see real improvements that are quantitative in terms of increased ability to stand and walk.  I will refer you to Dr. Ninfa Linden for the hip arthritis and ask his assessment and follow up of your hips and will continue to treat your spine condition conservatively.   Follow-Up Instructions: Return in about 6 weeks (around 11/19/2021).   Orders:  No orders of the defined types were placed in this encounter.  No orders of the defined types were placed in this encounter.     Procedures: No procedures performed   Clinical Data: No additional findings.   Subjective: Chief Complaint  Patient presents with   Lower Back - Follow-up    76 year old female with history of left hip laterally with  pain and stiffness with first rising to walk and improves after She has been up for a little bit. It is painful to lie on the left hip. No change in bowel or bladder, has history of diverticulosis. The pain present and she some times takes motrin or something I prescribed for this previously. She is having pain in the back Sitting and resting or lying down decreases the pain. She had ESI by Dr. [name] left L4 TF and this helped and also had injection of the  Left greater trochanter.    Review of Systems  Constitutional: Negative.   HENT: Negative.    Eyes: Negative.   Respiratory: Negative.    Cardiovascular: Negative.   Gastrointestinal: Negative.   Endocrine: Negative.   Genitourinary: Negative.   Musculoskeletal: Negative.   Skin: Negative.   Allergic/Immunologic: Negative.   Neurological: Negative.   Hematological: Negative.   Psychiatric/Behavioral: Negative.      Objective: Vital Signs: BP (!) 166/75 (BP Location: Left Arm, Patient Position: Sitting)   Pulse (!) 55   Ht '5\' 6"'$  (1.676 m)   Wt 167 lb (75.8 kg)   BMI 26.95 kg/m   Physical Exam Constitutional:      Appearance: She is well-developed.  HENT:     Head: Normocephalic and atraumatic.  Eyes:     Pupils: Pupils are equal, round, and reactive to light.  Pulmonary:     Effort: Pulmonary effort is normal.     Breath sounds: Normal breath sounds.  Abdominal:     General: Bowel sounds are normal.     Palpations: Abdomen is soft.  Musculoskeletal:     Cervical back: Normal range of motion and neck supple.     Lumbar back: Negative right straight leg raise test and negative left straight leg raise test.  Skin:    General: Skin is warm and dry.  Neurological:     Mental Status: She is alert and oriented to person, place, and time.  Psychiatric:        Behavior: Behavior normal.        Thought Content: Thought content normal.        Judgment: Judgment normal.    Back Exam   Tenderness  The patient is  experiencing tenderness in the lumbar.  Range of Motion  Extension:  abnormal  Flexion:  abnormal  Lateral bend right:  abnormal  Lateral bend left:  abnormal  Rotation right:  abnormal  Rotation left:  abnormal   Muscle Strength  Right Quadriceps:  5/5  Left Quadriceps:  5/5  Right Hamstrings:  5/5  Left Hamstrings:  5/5   Tests  Straight leg raise right: negative Straight leg raise left: negative  Reflexes  Patellar:  2/4 Achilles:  2/4 Babinski's sign: normal   Other  Toe walk: abnormal Heel walk: abnormal Gait: normal  Erythema: no back redness Scars: absent    Specialty Comments:  No specialty comments available.  Imaging: No results found.   PMFS History: Patient Active Problem List   Diagnosis Date Noted   Herniated lumbar intervertebral disc 06/30/2013    Priority: High    Class: Acute   Headache above the eye region 02/19/2021   Nasal congestion 02/19/2021   Emphysema lung ([place]) 10/04/2020   Abdominal pain, epigastric 03/18/2020   Low back pain without sciatica 10/30/2019   Body mass index (BMI) 26.0-26.9, adult 10/23/2019   Nonischemic cardiomyopathy ([place]) 04/12/2018   Chest pain 01/25/2018   Left bundle branch block 01/25/2018   Carotid artery disease ([place]) 01/25/2018   Seasonal allergies 10/18/2017   Arthritis 10/18/2017   Pre-diabetes 10/18/2017   Protein-calorie malnutrition, severe 05/05/2016   Dysphagia 05/04/2016   HNP (herniated nucleus pulposus) with myelopathy, cervical 04/17/2016   Abdominal pain 02/01/2016   Acute diverticulitis 02/01/2016   HLD (hyperlipidemia) 02/01/2016   Diabetes mellitus without complication ([place]) 17/79/3903   Hypokalemia 02/01/2016   Hypertension    Cervical spondylosis without myelopathy 01/13/2016   Bilateral carpal tunnel syndrome 01/13/2016   Past Medical History:  Diagnosis Date   Arthritis    back, knees, shoulder, hands , injections in pelvis, for bone spurs (05/04/2016)   Chronic back pain     Diverticulosis    History of kidney stones    HLD (hyperlipidemia)    Hypertension    Migraine    hx (05/04/2016)   Peripheral neuropathy    Radiculopathy    Sinus complaint    Sinus headache    occasional (05/04/2016)   Type II diabetes mellitus ([facility])    diet controlled, no meds now, was on insulin & po treatment at one time     Vertigo     Family History  Problem Relation Age of Onset   Uterine cancer Mother    Diabetes type II Sister    Diabetes Mellitus II Brother    Colon cancer Neg Hx  Pancreatic cancer Neg Hx    Esophageal cancer Neg Hx    Colon polyps Neg Hx    Rectal cancer Neg Hx    Stomach cancer Neg Hx     Past Surgical History:  Procedure Laterality Date   ANTERIOR CERVICAL DECOMP/DISCECTOMY FUSION N/A 04/17/2016   Procedure: ANTERIOR CERVICAL DECOMPRESSION/DISCECTOMY FUSION CERVICAL THREE - CERVICAL FOUR, POSTERIOR CERVICAL DISCECTOMY CERVICAL SEVEN -THORASIC ONE LEFT;  Surgeon: Ashok Pall, MD;  Location: [facility];  Service: Neurosurgery;  Laterality: N/A;  Anterior/Posterior   ANTERIOR FUSION CERVICAL SPINE  2000   Dr. Carloyn Manner; "for bone spurs; put hardware in too"   BACK SURGERY     COLONOSCOPY  06/10/2016   [facility]   LEFT HEART CATH AND CORONARY ANGIOGRAPHY N/A 03/17/2018   Procedure: LEFT HEART CATH AND CORONARY ANGIOGRAPHY;  Surgeon: Lorretta Harp, MD;  Location: [facility];  Service: Cardiovascular;  Laterality: N/A;   LUMBAR LAMINECTOMY Left 06/30/2013   Procedure: Left L3-4 Extraforaminal approach to excise far lateral herniated nucleus pulposus;  Surgeon: Jessy Oto, MD;  Location: [facility];  Service: Orthopedics;  Laterality: Left;   TUBAL LIGATION     VAGINAL HYSTERECTOMY     Social History   Occupational History   Not on file  Tobacco Use   Smoking status: Some Days    Packs/day: 0.50    Years: 20.00    Pack years: 10.00    Types: Cigarettes    Last attempt to quit: 06/30/1982    Years since quitting: 39.3   Smokeless tobacco:  Never   Tobacco comments:    Patient only smokes in social setting when drinking. Admits former h/o heavy smoking years ago smoked 1.5 packs/day.  Vaping Use   Vaping Use: Never used  Substance and Sexual Activity   Alcohol use: Not Currently    Comment: 05/04/2016 "might have a drink a couple days/year; might not"   Drug use: No   Sexual activity: Not Currently

## 2021-10-08 NOTE — Patient Instructions (Addendum)
  Plan: Avoid bending, stooping and avoid lifting weights greater than 10 lbs. Avoid prolong standing and walking. Avoid frequent bending and stooping  No lifting greater than 10 lbs. May use ice or moist heat for pain. Weight loss is of benefit. Handicap license is approved. Dr. Romona Curls secretary/Assistant will call to arrange for epidural steroid injection   Left hip trochanteric bursitis, left hip stretching exercises help. Ice or heat Use of arthritis medication motrin or naprosyn is helpful. The hips are starting to become symptomatic and your ability to walk and grocery shop suggests that you are functioning at a high level. Surgery is generally a consideration when The level of function decreases and you can expect to see real improvements that are quantitative in terms of increased ability to stand and walk.  I will refer you to Dr. Ninfa Linden for the hip arthritis and ask his assessment and follow up of your hips and will continue to treat your spine condition conservatively.

## 2021-10-23 ENCOUNTER — Other Ambulatory Visit: Payer: Self-pay | Admitting: Cardiovascular Disease

## 2021-11-10 ENCOUNTER — Encounter: Payer: Self-pay | Admitting: Family Medicine

## 2021-11-10 ENCOUNTER — Ambulatory Visit (INDEPENDENT_AMBULATORY_CARE_PROVIDER_SITE_OTHER): Payer: Medicare Other | Admitting: Family Medicine

## 2021-11-10 VITALS — BP 120/70 | HR 53 | Temp 98.7°F | Wt 173.6 lb

## 2021-11-10 DIAGNOSIS — E1169 Type 2 diabetes mellitus with other specified complication: Secondary | ICD-10-CM

## 2021-11-10 DIAGNOSIS — J439 Emphysema, unspecified: Secondary | ICD-10-CM

## 2021-11-10 DIAGNOSIS — R5383 Other fatigue: Secondary | ICD-10-CM

## 2021-11-10 DIAGNOSIS — R630 Anorexia: Secondary | ICD-10-CM

## 2021-11-10 DIAGNOSIS — I10 Essential (primary) hypertension: Secondary | ICD-10-CM

## 2021-11-10 MED ORDER — BUDESONIDE-FORMOTEROL FUMARATE 160-4.5 MCG/ACT IN AERO: 2.0000 | INHALATION_SPRAY | Freq: Two times a day (BID) | RESPIRATORY_TRACT | 12 refills | Status: DC

## 2021-11-11 LAB — CBC WITH DIFFERENTIAL/PLATELET
Basophils Absolute: 0 10*3/uL (ref 0.0–0.1)
Basophils Relative: 0.7 % (ref 0.0–3.0)
Eosinophils Absolute: 0.1 10*3/uL (ref 0.0–0.7)
Eosinophils Relative: 1.7 % (ref 0.0–5.0)
HCT: 42.5 % (ref 36.0–46.0)
Hemoglobin: 14.3 g/dL (ref 12.0–15.0)
Lymphocytes Relative: 41.1 % (ref 12.0–46.0)
Lymphs Abs: 2.5 10*3/uL (ref 0.7–4.0)
MCHC: 33.6 g/dL (ref 30.0–36.0)
MCV: 96.3 fl (ref 78.0–100.0)
Monocytes Absolute: 0.7 10*3/uL (ref 0.1–1.0)
Monocytes Relative: 11 % (ref 3.0–12.0)
Neutro Abs: 2.8 10*3/uL (ref 1.4–7.7)
Neutrophils Relative %: 45.5 % (ref 43.0–77.0)
Platelets: 271 10*3/uL (ref 150.0–400.0)
RBC: 4.41 Mil/uL (ref 3.87–5.11)
RDW: 13.1 % (ref 11.5–15.5)
WBC: 6.1 10*3/uL (ref 4.0–10.5)

## 2021-11-11 LAB — COMPREHENSIVE METABOLIC PANEL
ALT: 10 U/L (ref 0–35)
AST: 14 U/L (ref 0–37)
Albumin: 3.3 g/dL — ABNORMAL LOW (ref 3.5–5.2)
Alkaline Phosphatase: 60 U/L (ref 39–117)
BUN: 16 mg/dL (ref 6–23)
CO2: 26 mEq/L (ref 19–32)
Calcium: 9.5 mg/dL (ref 8.4–10.5)
Chloride: 102 mEq/L (ref 96–112)
Creatinine, Ser: 0.98 mg/dL (ref 0.40–1.20)
GFR: 56.58 mL/min — ABNORMAL LOW (ref >60.00)
Glucose, Bld: 155 mg/dL — ABNORMAL HIGH (ref 70–99)
Potassium: 3.6 mEq/L (ref 3.5–5.1)
Sodium: 138 mEq/L (ref 135–145)
Total Bilirubin: 0.4 mg/dL (ref 0.2–1.2)
Total Protein: 7.4 g/dL (ref 6.0–8.3)

## 2021-11-11 LAB — TSH: TSH: 1.94 u[IU]/mL (ref 0.35–5.50)

## 2021-11-11 LAB — VITAMIN B12: Vitamin B-12: 617 pg/mL (ref 211–911)

## 2021-11-11 LAB — T4, FREE: Free T4: 0.96 ng/dL (ref 0.60–1.60)

## 2021-11-11 LAB — HEMOGLOBIN A1C: Hgb A1c MFr Bld: 6.7 % — ABNORMAL HIGH (ref 4.6–6.5)

## 2021-11-11 LAB — VITAMIN D 25 HYDROXY (VIT D DEFICIENCY, FRACTURES): VITD: 36.54 ng/mL (ref 30.00–100.00)

## 2021-11-19 ENCOUNTER — Telehealth: Payer: Self-pay | Admitting: Family Medicine

## 2021-11-19 ENCOUNTER — Ambulatory Visit: Payer: Medicare Other | Admitting: Specialist

## 2021-11-19 ENCOUNTER — Encounter: Payer: Self-pay | Admitting: Specialist

## 2021-11-19 VITALS — BP 152/96 | HR 53 | Ht 66.0 in | Wt 174.0 lb

## 2021-11-19 DIAGNOSIS — M25552 Pain in left hip: Secondary | ICD-10-CM

## 2021-11-19 DIAGNOSIS — M160 Bilateral primary osteoarthritis of hip: Secondary | ICD-10-CM

## 2021-11-19 DIAGNOSIS — M4807 Spinal stenosis, lumbosacral region: Secondary | ICD-10-CM

## 2021-11-19 DIAGNOSIS — M4156 Other secondary scoliosis, lumbar region: Secondary | ICD-10-CM

## 2021-11-19 DIAGNOSIS — M7062 Trochanteric bursitis, left hip: Secondary | ICD-10-CM | POA: Diagnosis not present

## 2021-11-19 DIAGNOSIS — M5416 Radiculopathy, lumbar region: Secondary | ICD-10-CM | POA: Diagnosis not present

## 2021-11-19 DIAGNOSIS — M4316 Spondylolisthesis, lumbar region: Secondary | ICD-10-CM

## 2021-11-19 DIAGNOSIS — M1612 Unilateral primary osteoarthritis, left hip: Secondary | ICD-10-CM

## 2021-11-19 DIAGNOSIS — M47812 Spondylosis without myelopathy or radiculopathy, cervical region: Secondary | ICD-10-CM | POA: Diagnosis not present

## 2021-11-19 DIAGNOSIS — M5136 Other intervertebral disc degeneration, lumbar region: Secondary | ICD-10-CM

## 2021-11-19 DIAGNOSIS — M1712 Unilateral primary osteoarthritis, left knee: Secondary | ICD-10-CM | POA: Diagnosis not present

## 2021-11-19 DIAGNOSIS — M542 Cervicalgia: Secondary | ICD-10-CM | POA: Diagnosis not present

## 2021-11-19 MED ORDER — CELECOXIB 200 MG PO CAPS: 200.0000 mg | ORAL_CAPSULE | Freq: Every day | ORAL | 2 refills | Status: DC

## 2021-11-19 NOTE — Patient Instructions (Signed)
Plan: Pain with lying on the left side. Numbness and tingling into the left lateral thigh and calf and into the left great toe. Tender over the left greater trochanter  with good relief.   Avoid bending, stooping and avoid lifting weights greater than 10 lbs. Avoid prolong standing and walking. Avoid frequent bending and stooping  No lifting greater than 10 lbs. May use ice or moist heat for pain. Weight loss is of benefit. Handicap license is approved. PT for the lumbar spine ordered   Follow-Up Instructions: No follow-ups on file.

## 2021-11-19 NOTE — Progress Notes (Signed)
Office Visit Note   Patient: Angela Pugh           Date of Birth: 07-Mar-1946           MRN: [id number] Visit Date: 11/19/2021              Requested by: [name], MD [place],  [place] [address] PCP: [name], MD   Assessment & Plan: Visit Diagnoses:  1. Pain in left hip   2. Trochanteric bursitis, left hip   3. Spondylolisthesis, lumbar region   4. Lumbar radiculopathy   5. Other secondary scoliosis, lumbar region   6. Degenerative disc disease, lumbar   7. Cervicalgia   8. Spinal stenosis of lumbosacral region   9. Spondylosis without myelopathy or radiculopathy, cervical region   10. Unilateral primary osteoarthritis, left knee   11. Unilateral primary osteoarthritis, left hip   12. Bilateral primary osteoarthritis of hip     Plan: Plan: Pain with lying on the left side. Numbness and tingling into the left lateral thigh and calf and into the left great toe. Tender over the left greater trochanter  with good relief.   Avoid bending, stooping and avoid lifting weights greater than 10 lbs. Avoid prolong standing and walking. Avoid frequent bending and stooping  No lifting greater than 10 lbs. May use ice or moist heat for pain. Weight loss is of benefit. Handicap license is approved. PT for the lumbar spine ordered   Follow-Up Instructions: No follow-ups on file.     Follow-Up Instructions: Return in about 3 months (around 02/19/2022).   Orders:  No orders of the defined types were placed in this encounter.  Meds ordered this encounter  Medications   celecoxib (CELEBREX) 200 MG capsule    Sig: Take 1 capsule (200 mg total) by mouth daily.    Dispense:  90 capsule    Refill:  2      Procedures: No procedures performed   Clinical Data: No additional findings.   Subjective: Chief Complaint  Patient presents with   Lower Back - Follow-up   Left Hip - Follow-up    76 year old female with history of left hip  and knee pain and difficulty with prolong standing and walking. There is start pain and difficulty with AM stiffness and with first rising to stand and walk. She has been to PT and she wants to live with this as long as she    Review of Systems  Constitutional: Negative.   HENT: Negative.    Eyes: Negative.   Respiratory: Negative.    Cardiovascular: Negative.   Gastrointestinal: Negative.   Endocrine: Negative.   Genitourinary: Negative.   Musculoskeletal: Negative.   Skin: Negative.   Allergic/Immunologic: Negative.   Neurological: Negative.   Hematological: Negative.   Psychiatric/Behavioral: Negative.       Objective: Vital Signs: BP (!) 152/96 (BP Location: Left Arm, Patient Position: Sitting)   Pulse (!) 53   Ht '5\' 6"'$  (1.676 m)   Wt 174 lb (78.9 kg)   BMI 28.08 kg/m   Physical Exam Constitutional:      Appearance: She is well-developed.  HENT:     Head: Normocephalic and atraumatic.  Eyes:     Pupils: Pupils are equal, round, and reactive to light.  Pulmonary:     Effort: Pulmonary effort is normal.     Breath sounds: Normal breath sounds.  Abdominal:     General: Bowel sounds are normal.  Palpations: Abdomen is soft.  Musculoskeletal:     Cervical back: Normal range of motion and neck supple.     Lumbar back: Negative right straight leg raise test.     Left knee:     Instability Tests: Medial McMurray test negative and lateral McMurray test negative.  Skin:    General: Skin is warm and dry.  Neurological:     Mental Status: She is alert and oriented to person, place, and time.  Psychiatric:        Behavior: Behavior normal.        Thought Content: Thought content normal.        Judgment: Judgment normal.    Left Knee Exam   Muscle Strength  The patient has normal left knee strength.  Tenderness  The patient is experiencing tenderness in the patellar tendon.  Range of Motion  Extension:  abnormal   Tests  McMurray:  Medial - negative  Lateral - negative Varus: negative Valgus: negative   Right Hip Exam   Tenderness  The patient is experiencing tenderness in the greater trochanter.  Range of Motion  Abduction:  abnormal  Extension:  abnormal  Flexion:  normal  External rotation:  normal  Internal rotation:  abnormal   Muscle Strength  Abduction: 5/5  Adduction: 5/5  Flexion: 5/5   Tests  FABER: negative Ober: negative  Other  Erythema: absent Scars: absent   Left Hip Exam   Range of Motion  Abduction:  abnormal  Extension:  abnormal  Flexion:  normal  External rotation:  normal   Muscle Strength  Abduction: 5/5  Adduction: 5/5  Flexion: 5/5   Tests  FABER: negative Ober: negative  Other  Erythema: absent Scars: absent Sensation: normal Pulse: absent   Back Exam   Range of Motion  Extension:  abnormal  Flexion:  normal  Lateral bend right:  abnormal  Lateral bend left:  abnormal  Rotation right:  abnormal  Rotation left:  abnormal   Muscle Strength  Right Quadriceps:  5/5  Left Quadriceps:  5/5  Right Hamstrings:  5/5  Left Hamstrings:  5/5   Tests  Straight leg raise right: negative  Reflexes  Patellar:  2/4 Achilles:  2/4  Other  Toe walk: normal Heel walk: normal Sensation: normal  Comments:  Motor is normal No focal deficit. MRI with spondylolisthesis L4-5 with mod stenosis. Mild stenosis at L3-4 DDD L5-S1.  CT scan abdomen and pelvis from 20 mo ago shows bilateral hip OA that is moderately severe.      Specialty Comments:  No specialty comments available.  Imaging: No results found.   PMFS History: Patient Active Problem List   Diagnosis Date Noted   Herniated lumbar intervertebral disc 06/30/2013    Priority: High    Class: Acute   Headache above the eye region 02/19/2021   Nasal congestion 02/19/2021   Emphysema lung ([place]) 10/04/2020   Abdominal pain, epigastric 03/18/2020   Low back pain without sciatica 10/30/2019   Body mass index  (BMI) 26.0-26.9, adult 10/23/2019   Nonischemic cardiomyopathy ([place]) 04/12/2018   Chest pain 01/25/2018   Left bundle branch block 01/25/2018   Carotid artery disease ([place]) 01/25/2018   Seasonal allergies 10/18/2017   Arthritis 10/18/2017   Pre-diabetes 10/18/2017   Protein-calorie malnutrition, severe 05/05/2016   Dysphagia 05/04/2016   HNP (herniated nucleus pulposus) with myelopathy, cervical 04/17/2016   Abdominal pain 02/01/2016   Acute diverticulitis 02/01/2016   HLD (hyperlipidemia) 02/01/2016   Diabetes mellitus  without complication ([place]) 68/34/1962   Hypokalemia 02/01/2016   Hypertension    Cervical spondylosis without myelopathy 01/13/2016   Bilateral carpal tunnel syndrome 01/13/2016   Past Medical History:  Diagnosis Date   Arthritis    back, knees, shoulder, hands , injections in pelvis, for bone spurs (05/04/2016)   Chronic back pain    Diverticulosis    History of kidney stones    HLD (hyperlipidemia)    Hypertension    Migraine    hx (05/04/2016)   Peripheral neuropathy    Radiculopathy    Sinus complaint    Sinus headache    occasional (05/04/2016)   Type II diabetes mellitus ([place])    diet controlled, no meds now, was on insulin & po treatment at one time     Vertigo     Family History  Problem Relation Age of Onset   Uterine cancer Mother    Diabetes type II Sister    Diabetes Mellitus II Brother    Colon cancer Neg Hx    Pancreatic cancer Neg Hx    Esophageal cancer Neg Hx    Colon polyps Neg Hx    Rectal cancer Neg Hx    Stomach cancer Neg Hx     Past Surgical History:  Procedure Laterality Date   ANTERIOR CERVICAL DECOMP/DISCECTOMY FUSION N/A 04/17/2016   Procedure: ANTERIOR CERVICAL DECOMPRESSION/DISCECTOMY FUSION CERVICAL THREE - CERVICAL FOUR, POSTERIOR CERVICAL DISCECTOMY CERVICAL SEVEN -THORASIC ONE LEFT;  Surgeon: Ashok Pall, MD;  Location: [facility];  Service: Neurosurgery;  Laterality: N/A;  Anterior/Posterior   ANTERIOR FUSION  CERVICAL SPINE  2000   Dr. Carloyn Manner; "for bone spurs; put hardware in too"   BACK SURGERY     COLONOSCOPY  06/10/2016   [facility]   LEFT HEART CATH AND CORONARY ANGIOGRAPHY N/A 03/17/2018   Procedure: LEFT HEART CATH AND CORONARY ANGIOGRAPHY;  Surgeon: Lorretta Harp, MD;  Location: [facility];  Service: Cardiovascular;  Laterality: N/A;   LUMBAR LAMINECTOMY Left 06/30/2013   Procedure: Left L3-4 Extraforaminal approach to excise far lateral herniated nucleus pulposus;  Surgeon: Jessy Oto, MD;  Location: [facility];  Service: Orthopedics;  Laterality: Left;   TUBAL LIGATION     VAGINAL HYSTERECTOMY     Social History   Occupational History   Not on file  Tobacco Use   Smoking status: Some Days    Packs/day: 0.50    Years: 20.00    Total pack years: 10.00    Types: Cigarettes    Last attempt to quit: 06/30/1982    Years since quitting: 39.6   Smokeless tobacco: Never   Tobacco comments:    Patient only smokes in social setting when drinking. Admits former h/o heavy smoking years ago smoked 1.5 packs/day.    12/04/2021 Patient states she has not spoke for about 3 weeks  Vaping Use   Vaping Use: Never used  Substance and Sexual Activity   Alcohol use: Yes    Comment: 05/04/2016 "might have a drink a couple days/year; might not"   Drug use: No   Sexual activity: Not Currently

## 2021-11-19 NOTE — Telephone Encounter (Signed)
Pt called returning [place], [place] call - returning your call from Monday Please call her [phone]

## 2021-11-20 NOTE — Telephone Encounter (Signed)
The call Monday was regarding to patient' lab result. Already spoke to patient on lab result yesterday.

## 2021-11-24 ENCOUNTER — Telehealth: Payer: Self-pay | Admitting: Pharmacist

## 2021-11-24 NOTE — Chronic Care Management (AMB) (Signed)
Chronic Care Management Pharmacy Assistant   Name: Angela Pugh  MRN: [id number] DOB: 12/30/1945  Reason for Encounter: Disease State / Hypertension Assessment Call   Conditions to be addressed/monitored: HTN  Recent office visits:  11/10/2021 Grier Mitts MD - Patient was seen for fatigue and additional issues. Started Symbicort. Discontinued Lidocaine patch and Methocarbamol. Follow up in 1 month.   Recent consult visits:  11/19/2021 Basil Dess MD (orthopedic) - Patient was seen for pain in left hip and additional issues. Started Celebrex 200 mg daily. Follow up in 3 months.   10/08/2021 Basil Dess MD (orthopedic) - Patient was seen for pain in left hip and additional issues. No medication changes. Follow up in 6 weeks.   08/26/2021 Acquanetta Sit DPM (podiatry) - Patient was seen for Pain due to onychomycosis of toenails of both feet and additional issues. No medication changes. Follow up in 3 months.   07/15/2021 Chaunhi Lucianne Lei (opthamology) - Patient was seen for Hordeolum internum left lower eyelid and additional issues. No other chart notes.   07/09/2021 Basil Dess MD (orthopedic) - Patient was seen for Spondylolisthesis, lumbar region and additional issues. No medication changes. Follow up in 3 months.   06/30/2021 Arnoldo Hooker (opthamology) - Patient was seen for Hordeolum internum left lower eyelid. No other chart notes.   06/24/2021 Magnus Sinning MD (physical med and rehab) - Patient was seen for lumbar radiculopathy. No medication changes. Follow up as scheduled.   06/09/2021 Basil Dess MD (orthopedic) - Patient was seen for Spinal stenosis of lumbosacral region and additional issues. Discontinued Tramadol. Follow up in 4 weeks.  Hospital visits:  None  Medications: Outpatient Encounter Medications as of 11/24/2021  Medication Sig   amLODipine (NORVASC) 5 MG tablet Take by mouth.   amLODipine (NORVASC) 5 MG tablet TAKE 1 TABLET(5 MG) BY MOUTH DAILY    Aromatic Inhalants (VICKS VAPOR INHALER IN) Inhale 1 puff into the lungs as needed.   aspirin 81 MG EC tablet Take 81 mg by mouth daily. Swallow whole.   budesonide-formoterol (SYMBICORT) 160-4.5 MCG/ACT inhaler Inhale 2 puffs into the lungs in the morning and at bedtime.   Calcium Carb-Cholecalciferol (CALCIUM 1000 + D PO)    celecoxib (CELEBREX) 200 MG capsule Take 1 capsule (200 mg total) by mouth daily.   diazepam (VALIUM) 5 MG tablet Take one tablet after signing papers at MRI and may repeat after 15-20 mins if necessary for anxiety. (Patient not taking: Reported on 11/10/2021)   famotidine (PEPCID) 20 MG tablet Take 1 tablet (20 mg total) by mouth 3 times/day as needed-between meals & bedtime for heartburn or indigestion.   fexofenadine (ALLEGRA ALLERGY) 180 MG tablet Take 1 tablet (180 mg total) by mouth daily.   hyoscyamine (LEVSIN SL) 0.125 MG SL tablet Take 1 to 2 tablets under the tongue as needed for cramping and abdominal bloating.   mesalamine (LIALDA) 1.2 g EC tablet TAKE 4 TABLETS(4.8 GRAMS) BY MOUTH DAILY WITH BREAKFAST   Multiple Vitamins-Minerals (CENTRUM ADULTS PO) Take 1 tablet by mouth daily.    olmesartan (BENICAR) 5 MG tablet Take 2 tablets (10 mg total) by mouth daily.   Omega-3 Fatty Acids (FISH OIL PO) 2,000 mg daily.   pantoprazole (PROTONIX) 40 MG tablet Take 1 tablet (40 mg total) by mouth daily.   potassium chloride SA (KLOR-CON M) 20 MEQ tablet Take 1 tablet (20 mEq total) by mouth daily.   rosuvastatin (CRESTOR) 20 MG tablet Take 1 tablet (20 mg total) by mouth  daily.   triamcinolone cream (KENALOG) 0.5 % APPLY EXTERNALLY TO THE AFFECTED AREA TWICE DAILY AS NEEDED FOR DRY SKIN   vitamin B-12 (CYANOCOBALAMIN) 1000 MCG tablet Take 1,000 mcg by mouth daily.   No facility-administered encounter medications on file as of 11/24/2021.  Fill History: AMLODIPINE BESYLATE '5MG'$  TABLETS 10/23/2021 90   SYMBICORT 160/4.5MCG (120 ORAL INH) 11/10/2021 30   ALLERGY RELF  (FEXOFENADINE) '180MG'$  T 09/25/2021 30   OLMESARTAN MEDOXOMIL '5MG'$  TABLETS 09/12/2021 90   PANTOPRAZOLE '40MG'$  TABLETS 08/22/2021 90   POTASSIUM CL 20MEQ ER TABLETS 10/03/2021 90   ROSUVASTATIN '20MG'$  TABLETS 08/07/2021 90   TRIAMCINOLONE 0.5% CREAM 15GM 10/02/2021 14   Reviewed chart prior to disease state call. Spoke with patient regarding BP  Recent Office Vitals: BP Readings from Last 3 Encounters:  11/19/21 (!) 152/96  11/10/21 120/70  10/08/21 (!) 166/75   Pulse Readings from Last 3 Encounters:  11/19/21 (!) 53  11/10/21 (!) 53  10/08/21 (!) 55    Wt Readings from Last 3 Encounters:  11/19/21 174 lb (78.9 kg)  11/10/21 173 lb 9.6 oz (78.7 kg)  10/08/21 167 lb (75.8 kg)     Kidney Function Lab Results  Component Value Date/Time   CREATININE 0.98 11/10/2021 04:49 PM   CREATININE 1.04 10/03/2020 09:43 AM   CREATININE 0.99 (H) 01/12/2020 11:31 AM   GFR 56.58 (L) 11/10/2021 04:49 PM   GFRNONAA 57 (L) 01/12/2020 11:31 AM   GFRAA 66 01/12/2020 11:31 AM       Latest Ref Rng & Units 11/10/2021    4:49 PM 10/03/2020    9:43 AM 03/18/2020   11:41 AM  BMP  Glucose 70 - 99 mg/dL 155  114  103   BUN 6 - 23 mg/dL '16  14  13   '$ Creatinine 0.40 - 1.20 mg/dL 0.98  1.04  0.98   Sodium 135 - 145 mEq/L 138  140  142   Potassium 3.5 - 5.1 mEq/L 3.6  4.5  4.0   Chloride 96 - 112 mEq/L 102  101  102   CO2 19 - 32 mEq/L 26  32  35   Calcium 8.4 - 10.5 mg/dL 9.5  9.7  9.7     Current antihypertensive regimen:  Olmesartan 5 mg take 2 tablets daily Amlodipine 5 mg take 1 tablet daily  How often are you checking your Blood Pressure?   Current home BP readings:   What recent interventions/DTPs have been made by any provider to improve Blood Pressure control since last CPP Visit: No recent interventions.    Any recent hospitalizations or ED visits since last visit with CPP? No recent hospital visits.   What diet changes have been made to improve Blood Pressure Control?   What  exercise is being done to improve your Blood Pressure Control?    Adherence Review: Is the patient currently on ACE/ARB medication? Yes Does the patient have >5 day gap between last estimated fill dates? No  Unable to reach patient after several attempts  Care Gaps: AWV - scheduled for 02/10/2022 Last BP - 152/96 on 12/12/2021 Last A1C - 6.7 on 11/10/2021 Pneumonia vaccine - never done Hep C Screen - never done Tdap - never done Shingrix - never done Covid booster - overdue Eye exam - overdue  Star Rating Drugs: Olmesartan 5 mg - last filled 09/12/2021 90 DS at [facility] Crestor 20 mg - last filled 08/07/2021 90 DS at [facility] [phone]

## 2021-11-25 ENCOUNTER — Telehealth: Payer: Self-pay | Admitting: Family Medicine

## 2021-11-25 NOTE — Telephone Encounter (Signed)
Pt states she has congestion, post nasal drip, difficult to breathe thru nostrils. Requesting an antibiotic. Offered OV, none suit her request

## 2021-11-26 ENCOUNTER — Other Ambulatory Visit: Payer: Self-pay

## 2021-11-26 ENCOUNTER — Emergency Department (HOSPITAL_COMMUNITY): Payer: Medicare Other

## 2021-11-26 ENCOUNTER — Other Ambulatory Visit: Payer: Self-pay | Admitting: Gastroenterology

## 2021-11-26 ENCOUNTER — Encounter (HOSPITAL_COMMUNITY): Payer: Self-pay

## 2021-11-26 ENCOUNTER — Emergency Department (HOSPITAL_COMMUNITY)
Admission: EM | Admit: 2021-11-26 | Discharge: 2021-11-26 | Disposition: A | Discharge status: Home / Self Care | Payer: Medicare Other | Attending: Emergency Medicine | Admitting: Emergency Medicine

## 2021-11-26 DIAGNOSIS — I2510 Atherosclerotic heart disease of native coronary artery without angina pectoris: Secondary | ICD-10-CM | POA: Diagnosis not present

## 2021-11-26 DIAGNOSIS — Z7982 Long term (current) use of aspirin: Secondary | ICD-10-CM | POA: Diagnosis not present

## 2021-11-26 DIAGNOSIS — R059 Cough, unspecified: Secondary | ICD-10-CM | POA: Diagnosis not present

## 2021-11-26 DIAGNOSIS — Z743 Need for continuous supervision: Secondary | ICD-10-CM | POA: Diagnosis not present

## 2021-11-26 DIAGNOSIS — R051 Acute cough: Secondary | ICD-10-CM | POA: Insufficient documentation

## 2021-11-26 DIAGNOSIS — M542 Cervicalgia: Secondary | ICD-10-CM | POA: Diagnosis not present

## 2021-11-26 DIAGNOSIS — R079 Chest pain, unspecified: Secondary | ICD-10-CM | POA: Insufficient documentation

## 2021-11-26 DIAGNOSIS — I10 Essential (primary) hypertension: Secondary | ICD-10-CM | POA: Insufficient documentation

## 2021-11-26 DIAGNOSIS — I2583 Coronary atherosclerosis due to lipid rich plaque: Secondary | ICD-10-CM | POA: Diagnosis not present

## 2021-11-26 DIAGNOSIS — E876 Hypokalemia: Secondary | ICD-10-CM | POA: Diagnosis not present

## 2021-11-26 DIAGNOSIS — J3489 Other specified disorders of nose and nasal sinuses: Secondary | ICD-10-CM | POA: Insufficient documentation

## 2021-11-26 DIAGNOSIS — R6889 Other general symptoms and signs: Secondary | ICD-10-CM | POA: Diagnosis not present

## 2021-11-26 DIAGNOSIS — R0789 Other chest pain: Secondary | ICD-10-CM | POA: Diagnosis not present

## 2021-11-26 DIAGNOSIS — Z79899 Other long term (current) drug therapy: Secondary | ICD-10-CM | POA: Insufficient documentation

## 2021-11-26 DIAGNOSIS — E785 Hyperlipidemia, unspecified: Secondary | ICD-10-CM | POA: Diagnosis not present

## 2021-11-26 DIAGNOSIS — E119 Type 2 diabetes mellitus without complications: Secondary | ICD-10-CM | POA: Insufficient documentation

## 2021-11-26 DIAGNOSIS — R0602 Shortness of breath: Secondary | ICD-10-CM | POA: Insufficient documentation

## 2021-11-26 LAB — CBC WITH DIFFERENTIAL/PLATELET
Abs Immature Granulocytes: 0.01 10*3/uL (ref 0.00–0.07)
Basophils Absolute: 0.1 10*3/uL (ref 0.0–0.1)
Basophils Relative: 1 %
Eosinophils Absolute: 0.4 10*3/uL (ref 0.0–0.5)
Eosinophils Relative: 5 %
HCT: 44.2 % (ref 36.0–46.0)
Hemoglobin: 14.9 g/dL (ref 12.0–15.0)
Immature Granulocytes: 0 %
Lymphocytes Relative: 30 %
Lymphs Abs: 2.1 10*3/uL (ref 0.7–4.0)
MCH: 31.9 pg (ref 26.0–34.0)
MCHC: 33.7 g/dL (ref 30.0–36.0)
MCV: 94.6 fL (ref 80.0–100.0)
Monocytes Absolute: 0.9 10*3/uL (ref 0.1–1.0)
Monocytes Relative: 13 %
Neutro Abs: 3.7 10*3/uL (ref 1.7–7.7)
Neutrophils Relative %: 51 %
Platelets: 277 10*3/uL (ref 150–400)
RBC: 4.67 MIL/uL (ref 3.87–5.11)
RDW: 12.4 % (ref 11.5–15.5)
WBC: 7.2 10*3/uL (ref 4.0–10.5)
nRBC: 0 % (ref 0.0–0.2)

## 2021-11-26 LAB — COMPREHENSIVE METABOLIC PANEL
ALT: 14 U/L (ref 0–44)
AST: 16 U/L (ref 15–41)
Albumin: 3.5 g/dL (ref 3.5–5.0)
Alkaline Phosphatase: 63 U/L (ref 38–126)
Anion gap: 10 (ref 5–15)
BUN: 8 mg/dL (ref 8–23)
CO2: 25 mmol/L (ref 22–32)
Calcium: 9.4 mg/dL (ref 8.9–10.3)
Chloride: 105 mmol/L (ref 98–111)
Creatinine, Ser: 0.91 mg/dL (ref 0.44–1.00)
GFR, Estimated: >60 mL/min (ref >60)
Glucose, Bld: 141 mg/dL — ABNORMAL HIGH (ref 70–99)
Potassium: 3.4 mmol/L — ABNORMAL LOW (ref 3.5–5.1)
Sodium: 140 mmol/L (ref 135–145)
Total Bilirubin: 0.5 mg/dL (ref 0.3–1.2)
Total Protein: 7.5 g/dL (ref 6.5–8.1)

## 2021-11-26 LAB — BRAIN NATRIURETIC PEPTIDE: B Natriuretic Peptide: 84.5 pg/mL (ref 0.0–100.0)

## 2021-11-26 LAB — TROPONIN I (HIGH SENSITIVITY)
Troponin I (High Sensitivity): 5 ng/L (ref <18)
Troponin I (High Sensitivity): 7 ng/L (ref <18)

## 2021-11-26 MED ORDER — POTASSIUM CHLORIDE CRYS ER 20 MEQ PO TBCR
40.0000 meq | EXTENDED_RELEASE_TABLET | Freq: Once | ORAL | Status: AC
  Administered 2021-11-26: 40 meq via ORAL
  Filled 2021-11-26: qty 2

## 2021-11-26 NOTE — ED Triage Notes (Signed)
Pt arrived to ED via EMS from home w/ c/o intermittent CP, cough, nasal congestion and drainage x 2 months. EMS reported that pt said onset was Monday. Pt called PCP today and was told to call 911. Pt reports last night 2100 she started having sharp neck pain that went into her L arm as well as central CP described as pressure. Pt denies CP at this time and just c/o neck and shoulder pain. CP worse w/ cough.  EMS gave '324mg'$  ASA and 2 SL nitro.  18g L AC.  Initial BP 200/94 and after 2nd nitro BP was 126/83. Pain went from a 7 to a 5 after the nitro. EMS reports pt was NSR w/ L BBB on monitor. VS: HR 88, CBG 139, 99% RA

## 2021-11-26 NOTE — ED Notes (Signed)
EKG obtained and given to [facility] MD

## 2021-11-26 NOTE — Consult Note (Addendum)
The patient has been seen in conjunction with Fabian Sharp, PAC. All aspects of care have been considered and discussed. The patient has been personally interviewed, examined, and all clinical data has been reviewed.  The patient has h/o cervical spine disease with prior anterior approach surgery by Dr. Carloyn Manner for bone spurs remotely. In this setting, she has had URI foe 72 hours with sneezing, coughing , and sitting up to breath and suddenly developed neck and arm pain which led to fear she was having a heart attack. The discomfort has persisted for > 48 hours, but is now getting bette after several aspirin this AM. Chart review reveal prior w/u in 2019 with cath and widely patent arteries. LAD with non-obstructive atherosclerosis. Has rhonchi on exam that clears with cough. No murmur or rub. ECG with SR and LBBB unchanged from 2022.  IMPRESSION: Neck and arm symptoms are not ischemia related. No further cardiac w/u needed at this time as MI has been excluded by normal Ti without change on repeat.  Recommend: Treat URI symptomatically. With diabetes and elevated lipids in setting of non-obstructive atherosclerosis, needs aggressive lipid lowering and management of DM. Treat hypokalemia.    Cardiology Consultation:   Patient ID: Angela Pugh MRN: [id number]; DOB: 11-27-1945  Admit date: 11/26/2021 Date of Consult: 11/26/2021  PCP:  [name], MD   [facility] Providers Cardiologist:  Quay Burow, MD  Advanced Heart Failure:  Glori Bickers, MD    Patient Profile:   Angela Pugh is a 76 y.o. female with a hx of LBBB, HTN, DM, bilateral ICA stenosis, HLD, and CAD who is being seen 11/26/2021 for the evaluation of chest pain at the request of Dr. Billy Fischer.  History of Present Illness:   Angela Pugh was referred to Dr. Gwenlyn Found for chest pain.  She underwent CT coronary 03/04/2018 that showed a physiologically significant lesion in the proximal LAD with an FFR of  0.78.  Given this result, she underwent scheduled cardiac catheterization which revealed normal coronary arteries and moderate LV dysfunction with an EF of 40%.  Given her low EF she was referred to Dr. Haroldine Laws who obtained a cardiac MRI that showed no evidence of myocarditis.  Heart monitor did not show any arrhythmias.  She was determined to have a nonischemic cardiomyopathy with an EF in the 35 to 40% range.  Repeat echocardiogram 11/11/2020 showed normalized LV function.  Continues to get atypical chest pain and palpitations.  Heart monitor was not completed due to excessive sweating and inability to keep it on her skin.  Results that we did obtain showed occasional PACs and PVCs, 1 short run of SVT.  She was last seen in clinic with Dr. Gwenlyn Found 01/15/2021.  Occasional atypical chest pain is improved with Gas-X.  She has been maintained on benicar and amlodipine. No BB given bradycardia. Hyperlipidemia with LDL 178, started on crestor 20 mg.   She presented to [facility] 11/26/21 with intermittent CP, cough, nasal congestion and drainage x 2 months.   Workup so far includes HS troponin of 5, delta pending. BNP WNL.  CXR clear.  Cardiology was consulted for chest pain.  During my interview, she describes recent diagnosis of emphysema. She has a significant smoking history. She has been taking Symbicort by PCP but does not follow with pulmonology. She describes several months of nasal drainage, cough, and congestion. She has been self-treating with mucinex and humidification. She describes shortness of breath but it is difficult to discern her nasal  congestion and shortness of breath. She describes left neck pain that sounds muscular - she tried to treat with Beng[place]. This morning she describes worsening shortness of breath and left arm pain that was radiating from the back of her left neck. She was given 324 mg ASA and nitro with relief of all symptoms. She is currently feeling well and wants to go home.   Past  Medical History:  Diagnosis Date   Arthritis    back, knees, shoulder, hands , injections in pelvis, for bone spurs (05/04/2016)   Chronic back pain    Diverticulosis    History of kidney stones    HLD (hyperlipidemia)    Hypertension    Migraine    hx (05/04/2016)   Peripheral neuropathy    Radiculopathy    Sinus complaint    Sinus headache    occasional (05/04/2016)   Type II diabetes mellitus (HCC)    diet controlled, no meds now, was on insulin & po treatment at one time     Vertigo     Past Surgical History:  Procedure Laterality Date   ANTERIOR CERVICAL DECOMP/DISCECTOMY FUSION N/A 04/17/2016   Procedure: ANTERIOR CERVICAL DECOMPRESSION/DISCECTOMY FUSION CERVICAL THREE - CERVICAL FOUR, POSTERIOR CERVICAL DISCECTOMY CERVICAL SEVEN -THORASIC ONE LEFT;  Surgeon: Ashok Pall, MD;  Location: [facility];  Service: Neurosurgery;  Laterality: N/A;  Anterior/Posterior   ANTERIOR FUSION CERVICAL SPINE  2000   Dr. Carloyn Manner; "for bone spurs; put hardware in too"   BACK SURGERY     COLONOSCOPY  06/10/2016   [facility]   LEFT HEART CATH AND CORONARY ANGIOGRAPHY N/A 03/17/2018   Procedure: LEFT HEART CATH AND CORONARY ANGIOGRAPHY;  Surgeon: Lorretta Harp, MD;  Location: [facility];  Service: Cardiovascular;  Laterality: N/A;   LUMBAR LAMINECTOMY Left 06/30/2013   Procedure: Left L3-4 Extraforaminal approach to excise far lateral herniated nucleus pulposus;  Surgeon: Jessy Oto, MD;  Location: [facility];  Service: Orthopedics;  Laterality: Left;   TUBAL LIGATION     VAGINAL HYSTERECTOMY       Home Medications:  Prior to Admission medications   Medication Sig Start Date End Date Taking? Authorizing Provider  amLODipine (NORVASC) 5 MG tablet Take by mouth. 04/16/20   [provider]  amLODipine (NORVASC) 5 MG tablet TAKE 1 TABLET(5 MG) BY MOUTH DAILY 10/23/21   Lorretta Harp, MD  Aromatic Inhalants (VICKS VAPOR INHALER IN) Inhale 1 puff into the lungs as needed.    [provider]  aspirin 81 MG EC tablet Take 81 mg by mouth daily. Swallow whole.    [provider]  budesonide-formoterol (SYMBICORT) 160-4.5 MCG/ACT inhaler Inhale 2 puffs into the lungs in the morning and at bedtime. 11/10/21   [name], MD  Calcium Carb-Cholecalciferol (CALCIUM 1000 + D PO)     [provider]  celecoxib (CELEBREX) 200 MG capsule Take 1 capsule (200 mg total) by mouth daily. 11/19/21   Jessy Oto, MD  diazepam (VALIUM) 5 MG tablet Take one tablet after signing papers at MRI and may repeat after 15-20 mins if necessary for anxiety. Patient not taking: Reported on 11/10/2021 05/14/21   Jessy Oto, MD  famotidine (PEPCID) 20 MG tablet Take 1 tablet (20 mg total) by mouth 3 times/day as needed-between meals & bedtime for heartburn or indigestion. 03/18/20   Noralyn Pick, NP  fexofenadine (ALLEGRA ALLERGY) 180 MG tablet Take 1 tablet (180 mg total) by mouth daily. 10/03/20  [name], MD  hyoscyamine (LEVSIN SL) 0.125 MG SL tablet Take 1 to 2 tablets under the tongue as needed for cramping and abdominal bloating. 04/24/21   Milus Banister, MD  mesalamine (LIALDA) 1.2 g EC tablet TAKE 4 TABLETS(4.8 GRAMS) BY MOUTH DAILY WITH BREAKFAST 06/16/21   Milus Banister, MD  Multiple Vitamins-Minerals (CENTRUM ADULTS PO) Take 1 tablet by mouth daily.     [provider]  olmesartan (BENICAR) 5 MG tablet Take 2 tablets (10 mg total) by mouth daily. 09/12/21   [name], MD  Omega-3 Fatty Acids (FISH OIL PO) 2,000 mg daily.    [provider]  pantoprazole (PROTONIX) 40 MG tablet TAKE 1 TABLET(40 MG) BY MOUTH DAILY 11/26/21   Milus Banister, MD  potassium chloride SA (KLOR-CON M) 20 MEQ tablet Take 1 tablet (20 mEq total) by mouth daily. 07/02/21   [name], MD  rosuvastatin (CRESTOR) 20 MG tablet Take 1 tablet (20 mg total) by mouth daily. 04/24/21 07/23/21  Lorretta Harp, MD  triamcinolone cream (KENALOG) 0.5 %  APPLY EXTERNALLY TO THE AFFECTED AREA TWICE DAILY AS NEEDED FOR DRY SKIN 01/09/21   [name], MD  vitamin B-12 (CYANOCOBALAMIN) 1000 MCG tablet Take 1,000 mcg by mouth daily.    [provider]    Inpatient Medications: Scheduled Meds:  Continuous Infusions:  PRN Meds:   Allergies:    Allergies  Allergen Reactions   Acetaminophen Hives and Nausea Only   Azithromycin Hives and Nausea And Vomiting   Lipitor [Atorvastatin] Nausea Only    weakness   Zyrtec [Cetirizine]     Hives     Social History:   Social History   Socioeconomic History   Marital status: Widowed    Spouse name: Not on file   Number of children: Not on file   Years of education: Not on file   Highest education level: Not on file  Occupational History   Not on file  Tobacco Use   Smoking status: Some Days    Packs/day: 0.50    Years: 20.00    Total pack years: 10.00    Types: Cigarettes    Last attempt to quit: 06/30/1982    Years since quitting: 39.4   Smokeless tobacco: Never   Tobacco comments:    Patient only smokes in social setting when drinking. Admits former h/o heavy smoking years ago smoked 1.5 packs/day.  Vaping Use   Vaping Use: Never used  Substance and Sexual Activity   Alcohol use: Yes    Comment: 05/04/2016 "might have a drink a couple days/year; might not"   Drug use: No   Sexual activity: Not Currently  Other Topics Concern   Not on file  Social History Narrative   Not on file   Social Determinants of Health   Financial Resource Strain: Low Risk  (04/22/2021)   Overall Financial Resource Strain (CARDIA)    Difficulty of Paying Living Expenses: Not hard at all  Food Insecurity: No Food Insecurity (01/28/2021)   Hunger Vital Sign    Worried About Running Out of Food in the Last Year: Never true    Ran Out of Food in the Last Year: Never true  Transportation Needs: No Transportation Needs (04/22/2021)   PRAPARE - Hydrologist  (Medical): No    Lack of Transportation (Non-Medical): No  Physical Activity: Insufficiently Active (01/28/2021)   Exercise Vital Sign    Days of  Exercise per Week: 5 days    Minutes of Exercise per Session: 20 min  Stress: No Stress Concern Present (01/28/2021)   [place]    Feeling of Stress : Not at all  Social Connections: Moderately Isolated (01/28/2021)   Social Connection and Isolation Panel [NHANES]    Frequency of Communication with Friends and Family: More than three times a week    Frequency of Social Gatherings with Friends and Family: More than three times a week    Attends Religious Services: More than 4 times per year    Active Member of [employer] or Organizations: No    Attends Archivist Meetings: Never    Marital Status: Widowed  Intimate Partner Violence: Not At Risk (01/28/2021)   Humiliation, Afraid, Rape, and Kick questionnaire    Fear of Current or Ex-Partner: No    Emotionally Abused: No    Physically Abused: No    Sexually Abused: No    Family History:    Family History  Problem Relation Age of Onset   Uterine cancer Mother    Diabetes type II Sister    Diabetes Mellitus II Brother    Colon cancer Neg Hx    Pancreatic cancer Neg Hx    Esophageal cancer Neg Hx    Colon polyps Neg Hx    Rectal cancer Neg Hx    Stomach cancer Neg Hx      ROS:  Please see the history of present illness.   All other ROS reviewed and negative.     Physical Exam/Data:   Vitals:   11/26/21 1315 11/26/21 1330 11/26/21 1345 11/26/21 1400  BP: (!) 141/78 (!) 177/93 (!) 165/80 134/76  Pulse: (!) 54 62 62 (!) 57  Resp: '15 17 17 17  '$ Temp:      TempSrc:      SpO2: 96% 97% 97% 96%  Weight:      Height:       No intake or output data in the 24 hours ending 11/26/21 1434    11/26/2021   10:02 AM 11/19/2021    8:39 AM 11/10/2021    4:27 PM  Last 3 Weights  Weight (lbs) 172 lb 174 lb 173 lb 9.6 oz   Weight (kg) 78.019 kg 78.926 kg 78.744 kg     Body mass index is 27.76 kg/m.  General:  Well nourished, well developed, in no acute distress HEENT: normal Neck: no JVD Vascular: No carotid bruits; Distal pulses 2+ bilaterally Cardiac:  normal S1, S2; RRR sounds difficult given lung sounds Lungs: rhonchi throughout, no wheezing  Abd: soft, nontender, no hepatomegaly  Ext: no edema Musculoskeletal:  No deformities, BUE and BLE strength normal and equal Skin: warm and dry  Neuro:  CNs 2-12 intact, no focal abnormalities noted Psych:  Normal affect   EKG:  The EKG was personally reviewed and demonstrates:  sinus rhythm with HR 68 with PAC, LBBB (old) Telemetry:  Telemetry was personally reviewed and demonstrates:  sinus rhythm to sinus bradycardia with HR 50-60s, LBBB  Relevant CV Studies:  Echo 11/11/20:  1. Left ventricular ejection fraction, by estimation, is 60 to 65%. The  left ventricle has normal function. The left ventricle has no regional  wall motion abnormalities. Left ventricular diastolic parameters are  indeterminate.   2. Right ventricular systolic function is normal. The right ventricular  size is normal.   3. Left atrial size was moderately dilated.   4. Right  atrial size was mildly dilated.   5. The mitral valve is normal in structure. Mild mitral valve  regurgitation. No evidence of mitral stenosis.   6. The aortic valve is tricuspid. There is mild calcification of the  aortic valve. Aortic valve regurgitation is mild. No aortic stenosis is  present.    Left heart catheterization 2019: There is mild to moderate left ventricular systolic dysfunction. LV end diastolic pressure is mildly elevated. The left ventricular ejection fraction is 35-45% by visual estimate.   IMPRESSION: Angela Pugh has essentially normal coronary arteries and mild to moderate LV dysfunction.  Her EF is in the 40 to 45% range.  I believe her chest pain is noncardiac and a CT FFR was  falsely positive.  A minx closure device was successfully deployed achieving hemostasis.  The patient left the lab in stable condition.  She will be hydrated for the next several hours and discharged home.  She will be followed closely as an outpatient.   Laboratory Data:  High Sensitivity Troponin:   Recent Labs  Lab 11/26/21 1022 11/26/21 1344  TROPONINIHS 5 7     Chemistry Recent Labs  Lab 11/26/21 1022  NA 140  K 3.4*  CL 105  CO2 25  GLUCOSE 141*  BUN 8  CREATININE 0.91  CALCIUM 9.4  GFRNONAA >60  ANIONGAP 10    Recent Labs  Lab 11/26/21 1022  PROT 7.5  ALBUMIN 3.5  AST 16  ALT 14  ALKPHOS 63  BILITOT 0.5   Lipids No results for input(s): "CHOL", "TRIG", "HDL", "LABVLDL", "LDLCALC", "CHOLHDL" in the last 168 hours.  Hematology Recent Labs  Lab 11/26/21 1022  WBC 7.2  RBC 4.67  HGB 14.9  HCT 44.2  MCV 94.6  MCH 31.9  MCHC 33.7  RDW 12.4  PLT 277   Thyroid No results for input(s): "TSH", "FREET4" in the last 168 hours.  BNP Recent Labs  Lab 11/26/21 1022  BNP 84.5    DDimer No results for input(s): "DDIMER" in the last 168 hours.   Radiology/Studies:  DG Chest Portable 1 View  Result Date: 11/26/2021 CLINICAL DATA:  Chest pain EXAM: PORTABLE CHEST 1 VIEW COMPARISON:  10/03/2020 FINDINGS: The cardiac silhouette, mediastinal and hilar contours are within normal limits and stable given the AP projection and portable technique. No acute pulmonary findings. No pulmonary infiltrate or pleural effusion. The bony thorax is intact. IMPRESSION: No acute cardiopulmonary findings. Electronically Signed   By: Marijo Sanes M.D.   On: 11/26/2021 10:43     Assessment and Plan:   Chest pain Shortness of breath - hs troponin 5, delta pending - symptoms of chest pain and SOB relieved with ASA and nitro - LHC 2019 with essentially normal coronaries, but elevated calcium score on CT coronary - do not suspect her symptoms are angina - left arm pain sounds  consistent with MSK pain radiating from her left neck/trap area - chest pain description is generally unchanged from her baseline chest pain usually relieved with antacids  - if second troponin remains negative/flat, can discharge home from a cardiology perspective - I will arrange follow up   HFmrEF - improved Hx of LVEF 35-40% range, improved on echo 2022 - cMRI negative for myocarditis - BNP WNL - suspicion for LBBB mediated cardiomyopathy - she does not appear volume up on exam   Hypertension Hypertensive urgency Continue home medications - will advise to keep a BP log and bring to clinic   Hyperlipidemia with LDL  goal < 70 01/15/2021: Cholesterol, Total 253; HDL 51; LDL Chol Calc (NIH) 178; Triglycerides 133 Started on 20 mg crestor She needs a repeat lipid panel - can be done outpatient   Emphysema Follows with PCP with Symbicort Suspect her shortness of breath was related to her underlying pulmonary issues and anxiety - daughter reports she sounded like she was hyperventilating prior to coming to the ER     Risk Assessment/Risk Scores:       For questions or updates, please contact [place] Please consult [email or web address] for contact info under    Signed, Ledora Bottcher, [place]  11/26/2021 2:34 PM

## 2021-11-26 NOTE — ED Notes (Signed)
Pt c/o exertional shob, exertional CP and neck pain and tingling L pinky fingertip

## 2021-11-26 NOTE — Telephone Encounter (Signed)
Pt called and I was getting ready to make her an appt and she mention she is having left arm pain and I transfer her to triage nurse

## 2021-11-26 NOTE — ED Provider Notes (Signed)
[facility] EMERGENCY DEPARTMENT Provider Note   CSN: [id number] Arrival date & time: 11/26/21  6213     History  Chief Complaint  Patient presents with   Chest Pain   Nasal Congestion    Angela Pugh is a 76 y.o. female.  HPI     76 year old female with a history of diabetes type 2, hypertension, hyperlipidemia, arthritis chronic sinus symptoms and headache for which she had seen ENT in October of last year, who presents with concern for chest pain.  Reports that she has had intermittent episodes of chest pain over the last 2 to 3 months, located in the lower chest for which she takes some Gas-X and typically feels some improvement.  Last night, around 9 PM, she developed a new sensation with central chest pain with radiation to the left side of her neck, jaw, and down her left arm.  Describes it as a pressure-like pain, and describes a heaviness sensation in her left arm.  Reports she was still able to move it normally.  Did have tingling to the tip of her index finger and pinky finger.  No other numbness, weakness, difficulty talking or walking.  The pressure-like pain in her chest with radiation was worsened somewhat with exertion, and improved from a 7 out of 10 to 5 out of 10 with nitroglycerin with EMS.  She reports associated nausea.  Reports that she has been having episodes of diaphoresis that have been ongoing longer, but did note having some diaphoresis with this event last night.  She reports some dyspnea on exertion which improves with rest that has been going on for the last several months.  Has had cough and worsening nasal congestion beginning on Monday.  Reports clear rhinorrhea and cough productive of clear sputum, and denies fever.  Denies any leg pain, swelling, recent travel or immobilization or surgeries.  Reports she does occasionally smoke cigarettes now but not every day.  Denies any other alcohol or drug use.  Grandparents had heart disease.   She had prior evaluation with a cardiologist-see below   Per Dr. Kennon Holter note 09/2020: "A CT FFR performed 03/04/2018 showed a physiologically significant lesion in the proximal LAD with an FFR of 0.78.  Based on this, we decided to proceed with outpatient diagnostic radial cardiac catheterization.   This was performed 03/17/2018 revealing normal coronary arteries and moderate LV dysfunction with an EF of 40%.   I did refer her to Dr. Haroldine Laws who did a cardiac MRI that showed no evidence of myocarditis and an event monitor that was normal as well.  She has a nonischemic cardiomyopathy with an EF in the 35 to 40% range.  She was on low-dose Benicar amlodipine.  She gets episodic episodes of breath and atypical chest pain that improves with Gas-X."   Past Medical History:  Diagnosis Date   Arthritis    back, knees, shoulder, hands , injections in pelvis, for bone spurs (05/04/2016)   Chronic back pain    Diverticulosis    History of kidney stones    HLD (hyperlipidemia)    Hypertension    Migraine    hx (05/04/2016)   Peripheral neuropathy    Radiculopathy    Sinus complaint    Sinus headache    occasional (05/04/2016)   Type II diabetes mellitus ([place])    diet controlled, no meds now, was on insulin & po treatment at one time     Vertigo  Home Medications Prior to Admission medications   Medication Sig Start Date End Date Taking? Authorizing Provider  amLODipine (NORVASC) 5 MG tablet Take by mouth. 04/16/20   [provider]  amLODipine (NORVASC) 5 MG tablet TAKE 1 TABLET(5 MG) BY MOUTH DAILY 10/23/21   Lorretta Harp, MD  Aromatic Inhalants (VICKS VAPOR INHALER IN) Inhale 1 puff into the lungs as needed.    [provider]  aspirin 81 MG EC tablet Take 81 mg by mouth daily. Swallow whole.    [provider]  budesonide-formoterol (SYMBICORT) 160-4.5 MCG/ACT inhaler Inhale 2 puffs into the lungs in the morning and at bedtime. 11/10/21   [name], MD  Calcium Carb-Cholecalciferol (CALCIUM 1000 + D PO)     [provider]  celecoxib (CELEBREX) 200 MG capsule Take 1 capsule (200 mg total) by mouth daily. 11/19/21   Jessy Oto, MD  diazepam (VALIUM) 5 MG tablet Take one tablet after signing papers at MRI and may repeat after 15-20 mins if necessary for anxiety. Patient not taking: Reported on 11/10/2021 05/14/21   Jessy Oto, MD  famotidine (PEPCID) 20 MG tablet Take 1 tablet (20 mg total) by mouth 3 times/day as needed-between meals & bedtime for heartburn or indigestion. 03/18/20   Noralyn Pick, NP  fexofenadine (ALLEGRA ALLERGY) 180 MG tablet Take 1 tablet (180 mg total) by mouth daily. 10/03/20   [name], MD  hyoscyamine (LEVSIN SL) 0.125 MG SL tablet Take 1 to 2 tablets under the tongue as needed for cramping and abdominal bloating. 04/24/21   Milus Banister, MD  mesalamine (LIALDA) 1.2 g EC tablet TAKE 4 TABLETS(4.8 GRAMS) BY MOUTH DAILY WITH BREAKFAST 06/16/21   Milus Banister, MD  Multiple Vitamins-Minerals (CENTRUM ADULTS PO) Take 1 tablet by mouth daily.     [provider]  olmesartan (BENICAR) 5 MG tablet Take 2 tablets (10 mg total) by mouth daily. 09/12/21   [name], MD  Omega-3 Fatty Acids (FISH OIL PO) 2,000 mg daily.    [provider]  pantoprazole (PROTONIX) 40 MG tablet TAKE 1 TABLET(40 MG) BY MOUTH DAILY 11/26/21   Milus Banister, MD  potassium chloride SA (KLOR-CON M) 20 MEQ tablet Take 1 tablet (20 mEq total) by mouth daily. 07/02/21   [name], MD  rosuvastatin (CRESTOR) 20 MG tablet Take 1 tablet (20 mg total) by mouth daily. 04/24/21 07/23/21  Lorretta Harp, MD  triamcinolone cream (KENALOG) 0.5 % APPLY EXTERNALLY TO THE AFFECTED AREA TWICE DAILY AS NEEDED FOR DRY SKIN 01/09/21   [name], MD  vitamin B-12 (CYANOCOBALAMIN) 1000 MCG tablet Take 1,000 mcg by mouth daily.    [provider]      Allergies    Acetaminophen,  Azithromycin, Lipitor [atorvastatin], and Zyrtec [cetirizine]    Review of Systems   Review of Systems  Physical Exam Updated Vital Signs BP (!) 168/89   Pulse 67   Temp 98.3 F (36.8 C) (Oral)   Resp 15   Ht '5\' 6"'$  (1.676 m)   Wt 78 kg   SpO2 94%   BMI 27.76 kg/m  Physical Exam Vitals and nursing note reviewed.  Constitutional:      General: She is not in acute distress.    Appearance: She is well-developed. She is not diaphoretic.  HENT:     Head: Normocephalic and atraumatic.  Eyes:     Conjunctiva/sclera: Conjunctivae normal.  Cardiovascular:  Rate and Rhythm: Normal rate and regular rhythm.     Heart sounds: Normal heart sounds. No murmur heard.    No friction rub. No gallop.  Pulmonary:     Effort: Pulmonary effort is normal. No respiratory distress.     Breath sounds: Rhonchi (bialteral bases) present. No wheezing or rales.  Abdominal:     General: There is no distension.     Palpations: Abdomen is soft.     Tenderness: There is no abdominal tenderness. There is no guarding.  Musculoskeletal:        General: No tenderness.     Cervical back: Normal range of motion.  Skin:    General: Skin is warm and dry.     Findings: No erythema or rash.  Neurological:     Mental Status: She is alert and oriented to person, place, and time.     ED Results / Procedures / Treatments   Labs (all labs ordered are listed, but only abnormal results are displayed) Labs Reviewed  COMPREHENSIVE METABOLIC PANEL - Abnormal; Notable for the following components:      Result Value   Potassium 3.4 (*)    Glucose, Bld 141 (*)    All other components within normal limits  CBC WITH DIFFERENTIAL/PLATELET  BRAIN NATRIURETIC PEPTIDE  TROPONIN I (HIGH SENSITIVITY)  TROPONIN I (HIGH SENSITIVITY)    EKG EKG Interpretation  Date/Time:  Wednesday November 26 2021 09:47:34 EDT Ventricular Rate:  68 PR Interval:  211 QRS Duration: 151 QT Interval:  431 QTC Calculation: 459 R  Axis:   62 Text Interpretation: Sinus rhythm Atrial premature complex Left bundle branch block No significant change since last tracing Confirmed by Gareth Morgan [phone]) on 11/26/2021 9:51:40 AM  Radiology DG Chest Portable 1 View  Result Date: 11/26/2021 CLINICAL DATA:  Chest pain EXAM: PORTABLE CHEST 1 VIEW COMPARISON:  10/03/2020 FINDINGS: The cardiac silhouette, mediastinal and hilar contours are within normal limits and stable given the AP projection and portable technique. No acute pulmonary findings. No pulmonary infiltrate or pleural effusion. The bony thorax is intact. IMPRESSION: No acute cardiopulmonary findings. Electronically Signed   By: Marijo Sanes M.D.   On: 11/26/2021 10:43    Procedures Procedures    Medications Ordered in ED Medications  potassium chloride SA (KLOR-CON M) CR tablet 40 mEq (40 mEq Oral Given 11/26/21 1449)    ED Course/ Medical Decision Making/ A&P                           Medical Decision Making Amount and/or Complexity of Data Reviewed Labs: ordered. Radiology: ordered.  Risk Prescription drug management.   76 year old female with a history of diabetes type 2, hypertension, hyperlipidemia, arthritis chronic sinus symptoms and headache for which she had seen ENT in October of last year, who presents with concern for chest pain.   Differential diagnosis for chest pain includes pulmonary embolus, dissection, pneumothorax, pneumonia, ACS, myocarditis, pericarditis, GERD, cervical radiculopathy.  EKG was done and evaluate by me and showed no acute ST changes and no signs of pericarditis-shows known LBBB.   Do not feel history or exam are consistent with aortic dissection with normal bilateral pulses. No other neurologic abnormalities on exam with exception of altered sensation in pinky finger and index finger, no weakness, do not suspect CVA particularly in setting of other symptoms.  Consider other cervical radiculopathy-but do not see need for  emergent surgery.  Discussed at this time will  focus on the chest pain symptoms.  Received nitroglycerin and 324 ASA with home aspirin and EMS.  Labs completed and evaluated personally by me and show no significant anemia, electrolyte abnormality, leukocytosis.  Troponin negative x2.  BNP within normal limits.  Chest x-ray was done and evaluated by me and radiology and showed no sign of pneumonia or pneumothorax.   Given her history of coronary artery disease and high risk heart score, consulted cardiology for further evaluation.  Dr. Tamala Julian of cardiology came to bedside and evaluated the patient.  Feel she is stable for outpatient cardiology follow-up.  Recommend follow-up as an outpatient, and also discussed recommendation for follow-up with Dr. Christella Noa, her neurosurgeon given some paresthesias in the tips of her fingers and neck pain.  It is possible she has a viral URI with cough and congestion as well, recommend continued supportive care.  At this time have low suspicion for bacterial sinusitis or pneumonia in the setting of normal chest x-ray, no fever, no leukocytosis.  Patient discharged in stable condition with understanding of reasons to return.           Final Clinical Impression(s) / ED Diagnoses Final diagnoses:  Chest pain, unspecified type  Neck pain  Acute cough    Rx / DC Orders ED Discharge Orders     None         Gareth Morgan, MD 11/26/21 [phone]

## 2021-11-28 ENCOUNTER — Inpatient Hospital Stay: Payer: Medicare Other | Admitting: Adult Health

## 2021-12-01 ENCOUNTER — Emergency Department (HOSPITAL_COMMUNITY)
Admission: EM | Admit: 2021-12-01 | Discharge: 2021-12-01 | Disposition: A | Discharge status: Home / Self Care | Payer: Medicare Other | Attending: Emergency Medicine | Admitting: Emergency Medicine

## 2021-12-01 ENCOUNTER — Other Ambulatory Visit: Payer: Self-pay

## 2021-12-01 DIAGNOSIS — R404 Transient alteration of awareness: Secondary | ICD-10-CM | POA: Diagnosis not present

## 2021-12-01 DIAGNOSIS — I10 Essential (primary) hypertension: Secondary | ICD-10-CM | POA: Diagnosis not present

## 2021-12-01 DIAGNOSIS — R42 Dizziness and giddiness: Secondary | ICD-10-CM | POA: Insufficient documentation

## 2021-12-01 DIAGNOSIS — R55 Syncope and collapse: Secondary | ICD-10-CM | POA: Insufficient documentation

## 2021-12-01 DIAGNOSIS — F17210 Nicotine dependence, cigarettes, uncomplicated: Secondary | ICD-10-CM | POA: Insufficient documentation

## 2021-12-01 DIAGNOSIS — E860 Dehydration: Secondary | ICD-10-CM | POA: Insufficient documentation

## 2021-12-01 DIAGNOSIS — Z743 Need for continuous supervision: Secondary | ICD-10-CM | POA: Diagnosis not present

## 2021-12-01 DIAGNOSIS — Y92007 Garden or yard of unspecified non-institutional (private) residence as the place of occurrence of the external cause: Secondary | ICD-10-CM | POA: Diagnosis not present

## 2021-12-01 DIAGNOSIS — Z79899 Other long term (current) drug therapy: Secondary | ICD-10-CM | POA: Diagnosis not present

## 2021-12-01 DIAGNOSIS — E119 Type 2 diabetes mellitus without complications: Secondary | ICD-10-CM | POA: Diagnosis not present

## 2021-12-01 DIAGNOSIS — R209 Unspecified disturbances of skin sensation: Secondary | ICD-10-CM | POA: Insufficient documentation

## 2021-12-01 DIAGNOSIS — R61 Generalized hyperhidrosis: Secondary | ICD-10-CM | POA: Diagnosis not present

## 2021-12-01 LAB — CBC WITH DIFFERENTIAL/PLATELET
Abs Immature Granulocytes: 0.04 10*3/uL (ref 0.00–0.07)
Basophils Absolute: 0.1 10*3/uL (ref 0.0–0.1)
Basophils Relative: 1 %
Eosinophils Absolute: 0.1 10*3/uL (ref 0.0–0.5)
Eosinophils Relative: 1 %
HCT: 40.1 % (ref 36.0–46.0)
Hemoglobin: 13.6 g/dL (ref 12.0–15.0)
Immature Granulocytes: 1 %
Lymphocytes Relative: 34 %
Lymphs Abs: 2.1 10*3/uL (ref 0.7–4.0)
MCH: 32.2 pg (ref 26.0–34.0)
MCHC: 33.9 g/dL (ref 30.0–36.0)
MCV: 94.8 fL (ref 80.0–100.0)
Monocytes Absolute: 0.7 10*3/uL (ref 0.1–1.0)
Monocytes Relative: 11 %
Neutro Abs: 3.2 10*3/uL (ref 1.7–7.7)
Neutrophils Relative %: 52 %
Platelets: 262 10*3/uL (ref 150–400)
RBC: 4.23 MIL/uL (ref 3.87–5.11)
RDW: 12.4 % (ref 11.5–15.5)
WBC: 6.2 10*3/uL (ref 4.0–10.5)
nRBC: 0 % (ref 0.0–0.2)

## 2021-12-01 LAB — BASIC METABOLIC PANEL
Anion gap: 11 (ref 5–15)
BUN: 18 mg/dL (ref 8–23)
CO2: 22 mmol/L (ref 22–32)
Calcium: 9 mg/dL (ref 8.9–10.3)
Chloride: 104 mmol/L (ref 98–111)
Creatinine, Ser: 1.13 mg/dL — ABNORMAL HIGH (ref 0.44–1.00)
GFR, Estimated: 51 mL/min — ABNORMAL LOW (ref >60)
Glucose, Bld: 207 mg/dL — ABNORMAL HIGH (ref 70–99)
Potassium: 3.7 mmol/L (ref 3.5–5.1)
Sodium: 137 mmol/L (ref 135–145)

## 2021-12-01 MED ORDER — LACTATED RINGERS IV BOLUS
1000.0000 mL | Freq: Once | INTRAVENOUS | Status: AC
  Administered 2021-12-01: 1000 mL via INTRAVENOUS

## 2021-12-01 NOTE — Discharge Instructions (Signed)
Your labs today show that you are a bit dehydrated which likely contributed to your feelings of lightheadedness. Please drink plenty of water when working out in the heat. Return if symptoms worsen.

## 2021-12-01 NOTE — ED Triage Notes (Signed)
Patient is from home brought in by EMS. Family reports syncopal episode about 20 minutes ago. Family thought her blood sugar was low. On scene CBG was 173. Patient complains of dizziness and lightheadedness for 2 hours. Patient was been in the sun for 1.5 hours doing yard work. Syncopal episode occurred after working in the garden. Patient had water, orange juice and Gatorade.

## 2021-12-01 NOTE — ED Provider Notes (Signed)
[facility] EMERGENCY DEPARTMENT Provider Note   CSN: [id number] Arrival date & time: 12/01/21  2010     History  Chief Complaint  Patient presents with   Near Syncope    Angela Pugh is a 76 y.o. female with history of type 2 diabetes, hyperlipidemia, hypertension who presents to the emergency department for evaluation of a syncopal episode that occurred approximately 1 hour prior to arrival.  Patient states that she was out in the yard with friends just talking and laughing when she suddenly felt very flushed and lightheaded.  She states she went inside to drink a glass of orange juice.  She continued to feel lightheaded and she was sitting at the kitchen table when she passed out for few seconds to a minute.  Her daughter was a witness.  Fortunately, patient did not fall as she was resting her head forward onto the kitchen table.  She came to and then drink the rest of her orange juice and her daughter had called 911.  Currently, she still feels a bit lightheaded and she also has some numbness and tingling of the tips of her fingers.  On chart review, patient was here in the emergency department 5 days ago for evaluation of chest pain where she also complained of paresthesias in the tips of her fingers and has recommended follow-up with Dr. Christella Noa, her neurosurgeon.  Her chest pain work-up at that time was negative and she was recommended outpatient follow-up with cardiology.  She denies chest pain, shortness of breath, abdominal pain, nausea, vomiting, diarrhea, headache.    HPI     Home Medications Prior to Admission medications   Medication Sig Start Date End Date Taking? Authorizing Provider  amLODipine (NORVASC) 5 MG tablet Take by mouth. 04/16/20   [provider]  amLODipine (NORVASC) 5 MG tablet TAKE 1 TABLET(5 MG) BY MOUTH DAILY 10/23/21   Lorretta Harp, MD  Aromatic Inhalants (VICKS VAPOR INHALER IN) Inhale 1 puff into the lungs as needed.     [provider]  aspirin 81 MG EC tablet Take 81 mg by mouth daily. Swallow whole.    [provider]  budesonide-formoterol (SYMBICORT) 160-4.5 MCG/ACT inhaler Inhale 2 puffs into the lungs in the morning and at bedtime. 11/10/21   [name], MD  Calcium Carb-Cholecalciferol (CALCIUM 1000 + D PO)     [provider]  celecoxib (CELEBREX) 200 MG capsule Take 1 capsule (200 mg total) by mouth daily. 11/19/21   Jessy Oto, MD  diazepam (VALIUM) 5 MG tablet Take one tablet after signing papers at MRI and may repeat after 15-20 mins if necessary for anxiety. Patient not taking: Reported on 11/10/2021 05/14/21   Jessy Oto, MD  famotidine (PEPCID) 20 MG tablet Take 1 tablet (20 mg total) by mouth 3 times/day as needed-between meals & bedtime for heartburn or indigestion. 03/18/20   Noralyn Pick, NP  fexofenadine (ALLEGRA ALLERGY) 180 MG tablet Take 1 tablet (180 mg total) by mouth daily. 10/03/20   [name], MD  hyoscyamine (LEVSIN SL) 0.125 MG SL tablet Take 1 to 2 tablets under the tongue as needed for cramping and abdominal bloating. 04/24/21   Milus Banister, MD  mesalamine (LIALDA) 1.2 g EC tablet TAKE 4 TABLETS(4.8 GRAMS) BY MOUTH DAILY WITH BREAKFAST 06/16/21   Milus Banister, MD  Multiple Vitamins-Minerals (CENTRUM ADULTS PO) Take 1 tablet by mouth daily.     [provider]  olmesartan (BENICAR) 5 MG tablet Take 2 tablets (10 mg total) by mouth daily. 09/12/21   [name], MD  Omega-3 Fatty Acids (FISH OIL PO) 2,000 mg daily.    [provider]  pantoprazole (PROTONIX) 40 MG tablet TAKE 1 TABLET(40 MG) BY MOUTH DAILY 11/26/21   Milus Banister, MD  potassium chloride SA (KLOR-CON M) 20 MEQ tablet Take 1 tablet (20 mEq total) by mouth daily. 07/02/21   [name], MD  rosuvastatin (CRESTOR) 20 MG tablet Take 1 tablet (20 mg total) by mouth daily. 04/24/21 07/23/21  Lorretta Harp, MD  triamcinolone cream  (KENALOG) 0.5 % APPLY EXTERNALLY TO THE AFFECTED AREA TWICE DAILY AS NEEDED FOR DRY SKIN 01/09/21   [name], MD  vitamin B-12 (CYANOCOBALAMIN) 1000 MCG tablet Take 1,000 mcg by mouth daily.    [provider]      Allergies    Acetaminophen, Azithromycin, Lipitor [atorvastatin], and Zyrtec [cetirizine]    Review of Systems   Review of Systems  Respiratory:  Negative for shortness of breath.   Cardiovascular:  Negative for chest pain.  Gastrointestinal:  Negative for abdominal pain, diarrhea, nausea and vomiting.  Genitourinary:  Negative for dysuria.  Neurological:  Positive for syncope and light-headedness. Negative for dizziness, numbness and headaches.    Physical Exam Updated Vital Signs BP 107/63 (BP Location: Right Arm)   Pulse 64   Temp 98.3 F (36.8 C) (Oral)   Resp 16   SpO2 99%  Physical Exam Vitals and nursing note reviewed.  Constitutional:      General: She is not in acute distress.    Appearance: She is not ill-appearing.  HENT:     Head: Atraumatic.  Eyes:     Extraocular Movements: Extraocular movements intact.     Conjunctiva/sclera: Conjunctivae normal.     Pupils: Pupils are equal, round, and reactive to light.  Cardiovascular:     Rate and Rhythm: Normal rate and regular rhythm.     Pulses: Normal pulses.     Heart sounds: No murmur heard. Pulmonary:     Effort: Pulmonary effort is normal. No respiratory distress.     Breath sounds: Normal breath sounds.  Abdominal:     General: Abdomen is flat. There is no distension.     Palpations: Abdomen is soft.     Tenderness: There is no abdominal tenderness.  Musculoskeletal:        General: Normal range of motion.     Cervical back: Normal range of motion.  Skin:    General: Skin is warm and dry.     Capillary Refill: Capillary refill takes less than 2 seconds.  Neurological:     Mental Status: She is alert.     Comments: Speech is clear, able to follow commands CN III-XII  intact Normal strength in upper and lower extremities bilaterally including dorsiflexion and plantar flexion, strong and equal grip strength Subjective sensation intact Moves extremities without ataxia, coordination intact  No pronator drift    Psychiatric:        Mood and Affect: Mood normal.     ED Results / Procedures / Treatments   Labs (all labs ordered are listed, but only abnormal results are displayed) Labs Reviewed  BASIC METABOLIC PANEL - Abnormal; Notable for the following components:      Result Value   Glucose, Bld 207 (*)    Creatinine, Ser 1.13 (*)    GFR, Estimated 51 (*)  All other components within normal limits  CBC WITH DIFFERENTIAL/PLATELET  URINALYSIS, ROUTINE W REFLEX MICROSCOPIC    EKG None  Radiology No results found.  Procedures Procedures    Medications Ordered in ED Medications  lactated ringers bolus 1,000 mL (0 mLs Intravenous Stopped 12/01/21 2309)    ED Course/ Medical Decision Making/ A&P                           Medical Decision Making Amount and/or Complexity of Data Reviewed Labs: ordered. Radiology: ordered.   Social determinants of health:  Social History   Socioeconomic History   Marital status: Widowed    Spouse name: Not on file   Number of children: Not on file   Years of education: Not on file   Highest education level: Not on file  Occupational History   Not on file  Tobacco Use   Smoking status: Some Days    Packs/day: 0.50    Years: 20.00    Total pack years: 10.00    Types: Cigarettes    Last attempt to quit: 06/30/1982    Years since quitting: 39.4   Smokeless tobacco: Never   Tobacco comments:    Patient only smokes in social setting when drinking. Admits former h/o heavy smoking years ago smoked 1.5 packs/day.  Vaping Use   Vaping Use: Never used  Substance and Sexual Activity   Alcohol use: Yes    Comment: 05/04/2016 "might have a drink a couple days/year; might not"   Drug use: No    Sexual activity: Not Currently  Other Topics Concern   Not on file  Social History Narrative   Not on file   Social Determinants of Health   Financial Resource Strain: Low Risk  (04/22/2021)   Overall Financial Resource Strain (CARDIA)    Difficulty of Paying Living Expenses: Not hard at all  Food Insecurity: No Food Insecurity (01/28/2021)   Hunger Vital Sign    Worried About Running Out of Food in the Last Year: Never true    Ran Out of Food in the Last Year: Never true  Transportation Needs: No Transportation Needs (04/22/2021)   PRAPARE - Hydrologist (Medical): No    Lack of Transportation (Non-Medical): No  Physical Activity: Insufficiently Active (01/28/2021)   Exercise Vital Sign    Days of Exercise per Week: 5 days    Minutes of Exercise per Session: 20 min  Stress: No Stress Concern Present (01/28/2021)   [place]    Feeling of Stress : Not at all  Social Connections: Moderately Isolated (01/28/2021)   Social Connection and Isolation Panel [NHANES]    Frequency of Communication with Friends and Family: More than three times a week    Frequency of Social Gatherings with Friends and Family: More than three times a week    Attends Religious Services: More than 4 times per year    Active Member of [employer] or Organizations: No    Attends Archivist Meetings: Never    Marital Status: Widowed  Intimate Partner Violence: Not At Risk (01/28/2021)   Humiliation, Afraid, Rape, and Kick questionnaire    Fear of Current or Ex-Partner: No    Emotionally Abused: No    Physically Abused: No    Sexually Abused: No     Initial impression:  This patient presents to the ED for concern of syncope, this  involves an extensive number of treatment options, and is a complaint that carries with it a high risk of complications and morbidity.   Differentials include arrhythmia, dehydration,  metabolic abnormality, stroke, seizure.   Comorbidities affecting care:  Per HPI  Additional history obtained: Labs from 11/26/2021   Lab Tests  I Ordered, reviewed, and interpreted labs and EKG.  The pertinent results include:  Creat 1.13, increased from normal last week   Medicines ordered and prescription drug management:  I ordered medication including: 1 L LR bolus   Reevaluation of the patient after these medicines showed that the patient resolved I have reviewed the patients home medicines and have made adjustments as needed    ED Course/Re-evaluation: Presents in no acute distress and is not toxic appearing. Vitals without significant abnormality. Neuro exam normal. Physical exam benign. BMP shows creat of 1.13, increased from normal last week so 1L LR bolus was ordered. Labs otherwise reassuring. On reevaluation, patients symptoms are resolved. I considered ordering CT head, but was reassured by labs and exam. Patient wishes to dc home. My attending also evaluated the patient and agrees with plan and dispo. Pt expresses understanding.  Disposition:  After consideration of the diagnostic results, physical exam, history and the patients response to treatment feel that the patent would benefit from discharge.   Near syncope Dehydration: Plan and management as described above. Discharged home in good condition.  Final Clinical Impression(s) / ED Diagnoses Final diagnoses:  Near syncope  Dehydration    Rx / DC Orders ED Discharge Orders     None         Rodena Piety 12/01/21 2321    Godfrey Pick, MD 12/02/21 2313

## 2021-12-02 ENCOUNTER — Telehealth: Payer: Self-pay | Admitting: Cardiovascular Disease

## 2021-12-02 ENCOUNTER — Ambulatory Visit: Payer: Medicare Other | Admitting: Podiatry

## 2021-12-02 ENCOUNTER — Encounter: Payer: Self-pay | Admitting: Podiatry

## 2021-12-02 DIAGNOSIS — B351 Tinea unguium: Secondary | ICD-10-CM

## 2021-12-02 DIAGNOSIS — Q828 Other specified congenital malformations of skin: Secondary | ICD-10-CM | POA: Diagnosis not present

## 2021-12-02 DIAGNOSIS — M79675 Pain in left toe(s): Secondary | ICD-10-CM | POA: Diagnosis not present

## 2021-12-02 DIAGNOSIS — E1151 Type 2 diabetes mellitus with diabetic peripheral angiopathy without gangrene: Secondary | ICD-10-CM | POA: Diagnosis not present

## 2021-12-02 DIAGNOSIS — M79674 Pain in right toe(s): Secondary | ICD-10-CM

## 2021-12-02 NOTE — Telephone Encounter (Signed)
*  STAT* If patient is at the pharmacy, call can be transferred to refill team.   1. Which medications need to be refilled? (please list name of each medication and dose if known)  rosuvastatin (CRESTOR) 20 MG tablet  2. Which pharmacy/location (including street and city if local pharmacy) is medication to be sent to? [place], [place]  3. Do they need a 30 day or 90 day supply?  90 day supply

## 2021-12-03 MED ORDER — ROSUVASTATIN CALCIUM 20 MG PO TABS: 20.0000 mg | ORAL_TABLET | Freq: Every day | ORAL | 3 refills | Status: DC

## 2021-12-03 NOTE — Progress Notes (Addendum)
Cardiology Office Note:    Date:  12/04/2021   ID:  [name], DOB Jul 20, 1945, MRN [id number]  PCP:  [name], MD [place] Cardiologist: Quay Burow, MD   Reason for visit: Follow-up chest pain ED visit  History of Present Illness:    Angela Pugh is a 76 y.o. female with a hx of hypertension, diet-controlled diabetes, moderate bilateral ICA disease, left bundle branch block, normal coronaries LHC 2019 (following a falsely positive CTA), nonischemic cardiomyopathy with normalized EF, emphysema.  She Dr. Gwenlyn Found last saw her in August 2022.  She was doing well occasionally gets atypical chest pain palpitations.  She previously did a event monitor for 2 days showing occasional PACs/PVCs and short runs of SVT.  She went to the ED November 26, 2021 with chest pain x2-3 months in the lower chest she relates indigestion.  Gas-X typically improves symptoms.  Though, the night prior to ED visit, she had left sided neck pain radiating to her left elbow. Troponins negative.  Thought to possibly have a viral URI as patient had cough and congestion & sick exposure.  Patient back to the ED July 17 with a syncopal episode.  Patient felt flushed and lightheaded while burying her [place] pig outside.  Episode thought secondary to dehydration.  Given 1 L of fluids and discharged home.  She brings me EKG strip from the ED visit shows normal sinus rhythm with PACs.  Patient states she is very active with volunteering gardening.  She denies exertional chest pain.  Her main complaint is dyspnea with exertion and weakness.  She has a history of emphysema and plans to talk to her PCP next week about a pulmonary referral.  Denies wheezing.  She denies PND, orthopnea and lower extremity edema.    Past Medical History:  Diagnosis Date   Arthritis    back, knees, shoulder, hands , injections in pelvis, for bone spurs (05/04/2016)   Chronic back pain    Diverticulosis    History of  kidney stones    HLD (hyperlipidemia)    Hypertension    Migraine    hx (05/04/2016)   Peripheral neuropathy    Radiculopathy    Sinus complaint    Sinus headache    occasional (05/04/2016)   Type II diabetes mellitus (HCC)    diet controlled, no meds now, was on insulin & po treatment at one time     Vertigo     Past Surgical History:  Procedure Laterality Date   ANTERIOR CERVICAL DECOMP/DISCECTOMY FUSION N/A 04/17/2016   Procedure: ANTERIOR CERVICAL DECOMPRESSION/DISCECTOMY FUSION CERVICAL THREE - CERVICAL FOUR, POSTERIOR CERVICAL DISCECTOMY CERVICAL SEVEN -THORASIC ONE LEFT;  Surgeon: Ashok Pall, MD;  Location: [place];  Service: Neurosurgery;  Laterality: N/A;  Anterior/Posterior   ANTERIOR FUSION CERVICAL SPINE  2000   Dr. Carloyn Manner; "for bone spurs; put hardware in too"   BACK SURGERY     COLONOSCOPY  06/10/2016   [facility]   LEFT HEART CATH AND CORONARY ANGIOGRAPHY N/A 03/17/2018   Procedure: LEFT HEART CATH AND CORONARY ANGIOGRAPHY;  Surgeon: Lorretta Harp, MD;  Location: [place] [facility];  Service: Cardiovascular;  Laterality: N/A;   LUMBAR LAMINECTOMY Left 06/30/2013   Procedure: Left L3-4 Extraforaminal approach to excise far lateral herniated nucleus pulposus;  Surgeon: Jessy Oto, MD;  Location: [place];  Service: Orthopedics;  Laterality: Left;   TUBAL LIGATION     VAGINAL HYSTERECTOMY      Current Medications: Current  Meds  Medication Sig   amLODipine (NORVASC) 5 MG tablet Take by mouth.   Aromatic Inhalants (VICKS VAPOR INHALER IN) Inhale 1 puff into the lungs as needed.   aspirin 81 MG EC tablet Take 81 mg by mouth daily. Swallow whole.   budesonide-formoterol (SYMBICORT) 160-4.5 MCG/ACT inhaler Inhale 2 puffs into the lungs in the morning and at bedtime.   Calcium Carb-Cholecalciferol (CALCIUM 1000 + D PO)    celecoxib (CELEBREX) 200 MG capsule Take 1 capsule (200 mg total) by mouth daily.   famotidine (PEPCID) 20 MG tablet Take 1 tablet (20 mg total) by mouth 3  times/day as needed-between meals & bedtime for heartburn or indigestion.   fexofenadine (ALLEGRA ALLERGY) 180 MG tablet Take 1 tablet (180 mg total) by mouth daily.   hyoscyamine (LEVSIN SL) 0.125 MG SL tablet Take 1 to 2 tablets under the tongue as needed for cramping and abdominal bloating.   mesalamine (LIALDA) 1.2 g EC tablet TAKE 4 TABLETS(4.8 GRAMS) BY MOUTH DAILY WITH BREAKFAST   Multiple Vitamins-Minerals (CENTRUM ADULTS PO) Take 1 tablet by mouth daily.    olmesartan (BENICAR) 5 MG tablet Take 2 tablets (10 mg total) by mouth daily.   Omega-3 Fatty Acids (FISH OIL PO) 2,000 mg daily.   pantoprazole (PROTONIX) 40 MG tablet TAKE 1 TABLET(40 MG) BY MOUTH DAILY   potassium chloride SA (KLOR-CON M) 20 MEQ tablet Take 1 tablet (20 mEq total) by mouth daily.   rosuvastatin (CRESTOR) 20 MG tablet Take 1 tablet (20 mg total) by mouth daily.   triamcinolone cream (KENALOG) 0.5 % APPLY EXTERNALLY TO THE AFFECTED AREA TWICE DAILY AS NEEDED FOR DRY SKIN   vitamin B-12 (CYANOCOBALAMIN) 1000 MCG tablet Take 1,000 mcg by mouth daily.     Allergies:   Acetaminophen, Azithromycin, Lipitor [atorvastatin], and Zyrtec [cetirizine]   Social History   Socioeconomic History   Marital status: Widowed    Spouse name: Not on file   Number of children: Not on file   Years of education: Not on file   Highest education level: Not on file  Occupational History   Not on file  Tobacco Use   Smoking status: Some Days    Packs/day: 0.50    Years: 20.00    Total pack years: 10.00    Types: Cigarettes    Last attempt to quit: 06/30/1982    Years since quitting: 39.4   Smokeless tobacco: Never   Tobacco comments:    Patient only smokes in social setting when drinking. Admits former h/o heavy smoking years ago smoked 1.5 packs/day.    12/04/2021 Patient states she has not spoke for about 3 weeks  Vaping Use   Vaping Use: Never used  Substance and Sexual Activity   Alcohol use: Yes    Comment: 05/04/2016  "might have a drink a couple days/year; might not"   Drug use: No   Sexual activity: Not Currently  Other Topics Concern   Not on file  Social History Narrative   Not on file   Social Determinants of Health   Financial Resource Strain: Low Risk  (04/22/2021)   Overall Financial Resource Strain (CARDIA)    Difficulty of Paying Living Expenses: Not hard at all  Food Insecurity: No Food Insecurity (01/28/2021)   Hunger Vital Sign    Worried About Running Out of Food in the Last Year: Never true    Ran Out of Food in the Last Year: Never true  Transportation Needs: No Transportation Needs (04/22/2021)  PRAPARE - Hydrologist (Medical): No    Lack of Transportation (Non-Medical): No  Physical Activity: Insufficiently Active (01/28/2021)   Exercise Vital Sign    Days of Exercise per Week: 5 days    Minutes of Exercise per Session: 20 min  Stress: No Stress Concern Present (01/28/2021)   [place]    Feeling of Stress : Not at all  Social Connections: Moderately Isolated (01/28/2021)   Social Connection and Isolation Panel [NHANES]    Frequency of Communication with Friends and Family: More than three times a week    Frequency of Social Gatherings with Friends and Family: More than three times a week    Attends Religious Services: More than 4 times per year    Active Member of [employer] or Organizations: No    Attends Archivist Meetings: Never    Marital Status: Widowed     Family History: The patient's family history includes Diabetes Mellitus II in her brother; Diabetes type II in her sister; Uterine cancer in her mother. There is no history of Colon cancer, Pancreatic cancer, Esophageal cancer, Colon polyps, Rectal cancer, or Stomach cancer.  ROS:   Please see the history of present illness.     EKGs/Labs/Other Studies Reviewed:    EKG:  The ekg ordered today demonstrates normal  sinus rhythm, left bundle branch block, heart rate 69.  Recent Labs: 11/10/2021: TSH 1.94 11/26/2021: ALT 14; B Natriuretic Peptide 84.5 12/01/2021: BUN 18; Creatinine, Ser 1.13; Hemoglobin 13.6; Platelets 262; Potassium 3.7; Sodium 137   Recent Lipid Panel Lab Results  Component Value Date/Time   CHOL 253 (H) 01/15/2021 10:38 AM   TRIG 133 01/15/2021 10:38 AM   HDL 51 01/15/2021 10:38 AM   LDLCALC 178 (H) 01/15/2021 10:38 AM    Physical Exam:    VS:  BP 134/84 (BP Location: Left Arm, Patient Position: Sitting, Cuff Size: Normal)   Pulse 69   Ht '5\' 9"'$  (1.753 m)   Wt 170 lb 6.4 oz (77.3 kg)   SpO2 94%   BMI 25.16 kg/m    No data found.       Wt Readings from Last 3 Encounters:  12/04/21 170 lb 6.4 oz (77.3 kg)  11/26/21 172 lb (78 kg)  11/19/21 174 lb (78.9 kg)     GEN: Well nourished, well developed in no acute distress HEENT: Normal NECK: No JVD; No carotid bruits CARDIAC: RRR, no murmurs, rubs, gallops RESPIRATORY:  Clear to auscultation without rales, wheezing or rhonchi  ABDOMEN: Soft, non-tender, non-distended MUSCULOSKELETAL: No edema; No deformity  SKIN: Warm and dry NEUROLOGIC:  Alert and oriented PSYCHIATRIC:  Normal affect     ASSESSMENT AND PLAN   Precordial pain, atypical -LHC 2019 with normal coronaries (following false positive CTA) -Denies exertional chest pain  Nonischemic cardiomyopathy with normalized EF -Not on beta-blocker secondary to bradycardia -Prior EF 40%, 2D echo June 2022 with EF 60-65% and mild MR  Dyspnea on exertion -With worsening dyspnea on exertion, we will recheck 2D echo.   -Recommend pulmonary referral -plans to discuss with PCP next week. -If pulmonary work-up unremarkable, could consider stress testing.    Carotid artery stenosis -Carotid duplex in May 2019 at [facility] showed moderate bilateral carotid disease -Carotid duplex here in March 2020 showed mild bilateral carotid disease -Patient concerned  about some vision changes & numbness, recheck carotid duplex  Hypertension, reasonably controlled -Continue amlodipine and Benicar. -  Goal BP is <130/80.  Recommend DASH diet (high in vegetables, fruits, low-fat dairy products, whole grains, poultry, fish, and nuts and low in sweets, sugar-sweetened beverages, and red meats), salt restriction and increase physical activity.  Hyperlipidemia -Continue Crestor.  Disposition - Follow-up in 3 to 4 months with Dr. Gwenlyn Found.   Medication Adjustments/Labs and Tests Ordered: Current medicines are reviewed at length with the patient today.  Concerns regarding medicines are outlined above.  Orders Placed This Encounter  Procedures   EKG 12-Lead   ECHOCARDIOGRAM COMPLETE   VAS US CAROTID   No orders of the defined types were placed in this encounter.   Patient Instructions  Medication Instructions:  No Changes *If you need a refill on your cardiac medications before your next appointment, please call your pharmacy*   Lab Work: No labs If you have labs (blood work) drawn today and your tests are completely normal, you will receive your results only by: [place] (if you have MyChart) OR A paper copy in the mail If you have any lab test that is abnormal or we need to change your treatment, we will call you to review the results.   Testing/Procedures: [place], [address]. Your physician has requested that you have a carotid duplex. This test is an ultrasound of the carotid arteries in your neck. It looks at blood flow through these arteries that supply the brain with blood. Allow one hour for this exam. There are no restrictions or special instructions.   [place] Your physician has requested that you have an echocardiogram. Echocardiography is a painless test that uses sound waves to create images of your heart. It provides your doctor with information about the size and shape of your heart and how  well your heart's chambers and valves are working. This procedure takes approximately one hour. There are no restrictions for this procedure.   Follow-Up: At [facility], you and your health needs are our priority.  As part of our continuing mission to provide you with exceptional heart care, we have created designated Provider Care Teams.  These Care Teams include your primary Cardiologist (physician) and Advanced Practice Providers (APPs -  Physician Assistants and Nurse Practitioners) who all work together to provide you with the care you need, when you need it.  We recommend signing up for the patient portal called "MyChart".  Sign up information is provided on this After Visit Summary.  MyChart is used to connect with patients for Virtual Visits (Telemedicine).  Patients are able to view lab/test results, encounter notes, upcoming appointments, etc.  Non-urgent messages can be sent to your provider as well.   To learn more about what you can do with MyChart, go to [email or web address].    Your next appointment:   4 month(s)  The format for your next appointment:   In Person  Provider:   Quay Burow, MD     Other Instructions Recommend discussing referral to  Pulmonolgy.  Important Information About Sugar         Signed, Gaston Islam  12/04/2021 12:03 PM    [facility]

## 2021-12-04 ENCOUNTER — Ambulatory Visit (INDEPENDENT_AMBULATORY_CARE_PROVIDER_SITE_OTHER): Payer: Medicare Other | Admitting: Physician Assistant

## 2021-12-04 ENCOUNTER — Encounter: Payer: Self-pay | Admitting: Physician Assistant

## 2021-12-04 VITALS — BP 134/84 | HR 69 | Ht 69.0 in | Wt 170.4 lb

## 2021-12-04 DIAGNOSIS — R0609 Other forms of dyspnea: Secondary | ICD-10-CM

## 2021-12-04 DIAGNOSIS — I428 Other cardiomyopathies: Secondary | ICD-10-CM

## 2021-12-04 DIAGNOSIS — R072 Precordial pain: Secondary | ICD-10-CM

## 2021-12-04 DIAGNOSIS — I10 Essential (primary) hypertension: Secondary | ICD-10-CM

## 2021-12-04 DIAGNOSIS — I6523 Occlusion and stenosis of bilateral carotid arteries: Secondary | ICD-10-CM

## 2021-12-04 NOTE — Patient Instructions (Signed)
Medication Instructions:  No Changes *If you need a refill on your cardiac medications before your next appointment, please call your pharmacy*   Lab Work: No labs If you have labs (blood work) drawn today and your tests are completely normal, you will receive your results only by: [place] (if you have MyChart) OR A paper copy in the mail If you have any lab test that is abnormal or we need to change your treatment, we will call you to review the results.   Testing/Procedures: [place], [address]. Your physician has requested that you have a carotid duplex. This test is an ultrasound of the carotid arteries in your neck. It looks at blood flow through these arteries that supply the brain with blood. Allow one hour for this exam. There are no restrictions or special instructions.   [place] Your physician has requested that you have an echocardiogram. Echocardiography is a painless test that uses sound waves to create images of your heart. It provides your doctor with information about the size and shape of your heart and how well your heart's chambers and valves are working. This procedure takes approximately one hour. There are no restrictions for this procedure.   Follow-Up: At [facility], you and your health needs are our priority.  As part of our continuing mission to provide you with exceptional heart care, we have created designated Provider Care Teams.  These Care Teams include your primary Cardiologist (physician) and Advanced Practice Providers (APPs -  Physician Assistants and Nurse Practitioners) who all work together to provide you with the care you need, when you need it.  We recommend signing up for the patient portal called "MyChart".  Sign up information is provided on this After Visit Summary.  MyChart is used to connect with patients for Virtual Visits (Telemedicine).  Patients are able to view lab/test results, encounter  notes, upcoming appointments, etc.  Non-urgent messages can be sent to your provider as well.   To learn more about what you can do with MyChart, go to [email or web address].    Your next appointment:   4 month(s)  The format for your next appointment:   In Person  Provider:   Quay Burow, MD     Other Instructions Recommend discussing referral to  Pulmonolgy.  Important Information About Sugar

## 2021-12-07 NOTE — Progress Notes (Signed)
  Subjective:  Patient ID: Angela Pugh, female    DOB: 1946/03/17,  MRN: [id number]  Angela Pugh presents to clinic today for at risk foot care. Pt has h/o NIDDM with PAD and painful porokeratotic lesion(s) b/l lower extremities and painful mycotic toenails that limit ambulation. Painful toenails interfere with ambulation. Aggravating factors include wearing enclosed shoe gear. Pain is relieved with periodic professional debridement. Painful porokeratotic lesions are aggravated when weightbearing with and without shoegear. Pain is relieved with periodic professional debridement.  Last A1c was 6.7%. Patient did not check blood glucose today.  New problem(s): None.   PCP is [name], MD , and last visit was  November 10, 2021  Allergies  Allergen Reactions   Acetaminophen Hives and Nausea Only   Azithromycin Hives and Nausea And Vomiting   Lipitor [Atorvastatin] Nausea Only    weakness   Zyrtec [Cetirizine]     Hives     Review of Systems: Negative except as noted in the HPI.  Objective:  Ferrin Pugh is a pleasant 76 y.o. female in NAD. AAO X 3.  Vascular Examination: CFT <3 seconds b/l LE. Palpable DP pulse(s) right lower extremity Faintly palpable PT pulse(s) b/l LE. Diminished DP pulse(s) left lower extremity. Pedal hair sparse. No pain with calf compression b/l. Lower extremity skin temperature gradient within normal limits. No edema noted b/l LE. No ischemia or gangrene noted b/l LE. No cyanosis or clubbing noted b/l LE.  Dermatological Examination: Pedal integument with normal turgor, texture and tone b/l LE. No open wounds b/l. No interdigital macerations b/l. Toenails left great toe and 2-5 b/l elongated, thickened, discolored with subungual debris. +Tenderness with dorsal palpation of nailplates. No fluctuance. Porokeratotic lesion(s) noted plantar heel pad of right foot, posterolateral aspect of right heel, and submet head 2 b/l.  Musculoskeletal  Examination: Normal muscle strength 5/5 to all lower extremity muscle groups bilaterally. Hammertoe deformity noted 2-5 b/l.Marland Kitchen No pain, crepitus or joint limitation noted with ROM b/l LE.  Patient ambulates independently without assistive aids.  Neurological Examination: Pt has subjective symptoms of neuropathy. Protective sensation intact 5/5 intact bilaterally with 10g monofilament b/l. Vibratory sensation intact b/l. Clonus negative b/l.     Latest Ref Rng & Units 11/10/2021    4:49 PM  Hemoglobin A1C  Hemoglobin-A1c 4.6 - 6.5 % 6.7    Assessment/Plan: 1. Pain due to onychomycosis of toenails of both feet   2. Porokeratosis   3. Type II diabetes mellitus with peripheral circulatory disorder [facility])      -Patient was evaluated and treated. All patient's and/or POA's questions/concerns answered on today's visit. -Patient to continue soft, supportive shoe gear daily. -Mycotic toenails 2-5 bilaterally and left great toe were debrided in length and girth with sterile nail nippers and dremel without iatrogenic bleeding. -Porokeratotic lesion(s) plantar heel pad of right foot, posterolateral aspect of right heel, and submet head 2 b/l pared and enucleated with sterile scalpel blade without incident. Total number of lesions debrided=4. -Patient/POA to call should there be question/concern in the interim.   Return in about 3 months (around 03/04/2022).  Marzetta Board, DPM

## 2021-12-08 ENCOUNTER — Telehealth: Payer: Self-pay | Admitting: Pharmacist

## 2021-12-08 NOTE — Chronic Care Management (AMB) (Signed)
    Chronic Care Management Pharmacy Assistant   Name: Angela Pugh  MRN: [id number] DOB: 01/30/1946  Reason for Encounter: Follow up call for ED visit, chest pain    Recent consult visits:  12/04/2021 Caron Presume PA-C [facility] - Patient was seen for Dyspnea on exertion, Precordial pain, Nonischemic cardiomyopathy, Primary hypertension and Bilateral carotid artery stenosis. No medication changes. She went to the ED November 26, 2021 with chest pain x2-3 months in the lower chest she relates indigestion.  Gas-X typically improves symptoms.  Though, the night prior to ED visit, she had left sided neck pain radiating to her left elbow. Troponins negative.  Thought to possibly have a viral URI as patient had cough and congestion & sick exposure.  Patient back to the ED July 17 with a syncopal episode.  Patient felt flushed and lightheaded while burying her [place] pig outside.  Episode thought secondary to dehydration.  Given 1 L of fluids and discharged home.  She brings me EKG strip from the ED visit shows normal sinus rhythm with PACs. Follow up in 3-4 months with Dr. Serena Croissant visits:  Patient was seen at [facility] ED on 11/26/2021 (6 hours) due to Chest pain, unspecified type.    New?Medications Started at [facility] Discharge:?? No new medications Medication Changes at Hospital Discharge: No medication changes Medications Discontinued at Hospital Discharge: No medications discontinued Medications that remain the same after Hospital Discharge:??  -All other medications will remain the same.    Notes:  Spoke with patient, she states she is doing fine at this time, she has an appointment with her cardiologist in the morning.  She thanked [place] for calling and checking in with her.   [place] Pharmacist Assistant [phone]

## 2021-12-11 ENCOUNTER — Ambulatory Visit (INDEPENDENT_AMBULATORY_CARE_PROVIDER_SITE_OTHER): Payer: Medicare Other | Admitting: Family Medicine

## 2021-12-11 VITALS — BP 150/80 | HR 55 | Temp 98.6°F | Wt 172.4 lb

## 2021-12-11 DIAGNOSIS — M791 Myalgia, unspecified site: Secondary | ICD-10-CM

## 2021-12-11 DIAGNOSIS — J439 Emphysema, unspecified: Secondary | ICD-10-CM | POA: Diagnosis not present

## 2021-12-11 DIAGNOSIS — E1169 Type 2 diabetes mellitus with other specified complication: Secondary | ICD-10-CM | POA: Diagnosis not present

## 2021-12-11 DIAGNOSIS — R202 Paresthesia of skin: Secondary | ICD-10-CM

## 2021-12-11 DIAGNOSIS — R55 Syncope and collapse: Secondary | ICD-10-CM | POA: Diagnosis not present

## 2021-12-11 DIAGNOSIS — K219 Gastro-esophageal reflux disease without esophagitis: Secondary | ICD-10-CM | POA: Diagnosis not present

## 2021-12-11 DIAGNOSIS — R0602 Shortness of breath: Secondary | ICD-10-CM | POA: Diagnosis not present

## 2021-12-11 DIAGNOSIS — I10 Essential (primary) hypertension: Secondary | ICD-10-CM

## 2021-12-11 LAB — POCT URINALYSIS DIPSTICK
Bilirubin, UA: NEGATIVE
Blood, UA: POSITIVE
Glucose, UA: NEGATIVE
Ketones, UA: NEGATIVE
Leukocytes, UA: NEGATIVE
Nitrite, UA: NEGATIVE
Protein, UA: POSITIVE — AB
Spec Grav, UA: 1.025 (ref 1.010–1.025)
Urobilinogen, UA: 0.2 E.U./dL
pH, UA: 5.5 (ref 5.0–8.0)

## 2021-12-11 NOTE — Progress Notes (Signed)
Subjective:    Patient ID: Angela Pugh, female    DOB: [birth year], 76 y.o.   MRN: [id number]  Chief Complaint  Patient presents with   Follow-up   Hypertension   Diabetes    HPI Patient was seen today for f/u.  Pt seen in ED 11/26/21 for CP and on 12/01/21 for near syncope.  Pt states she became diaphoretic while sitting outside with family.  Pt states she passed out for a few secs.  Drank some orange juice which helped.  Pt endorses a similar episode yrs ago.  Pt seen by Cards.  Taking symbicort with some improvement in midline chest pressure.  Has f/u with Neurosurgery Aug 1 for numbness of L 1st and 2nd digits.  Pt notes increased GERD/chest discomfort x 1 month.  May take 6 acid pills to reduce sensation.  Has pepcid and protonix.  States bp not typically this high.  Taking norvasc 5 mg, olmesartan 5 mg.  DM diet controlled.  Inquires about having urine checked.  Past Medical History:  Diagnosis Date   Arthritis    back, knees, shoulder, hands , injections in pelvis, for bone spurs (05/04/2016)   Chronic back pain    Diverticulosis    History of kidney stones    HLD (hyperlipidemia)    Hypertension    Migraine    hx (05/04/2016)   Peripheral neuropathy    Radiculopathy    Sinus complaint    Sinus headache    occasional (05/04/2016)   Type II diabetes mellitus ([place])    diet controlled, no meds now, was on insulin & po treatment at one time     Vertigo     Allergies  Allergen Reactions   Acetaminophen Hives and Nausea Only   Azithromycin Hives and Nausea And Vomiting   Lipitor [Atorvastatin] Nausea Only    weakness   Zyrtec [Cetirizine]     Hives     ROS General: Denies fever, chills, night sweats, changes in weight, changes in appetite +syncope HEENT: Denies headaches, ear pain, changes in vision, rhinorrhea, sore throat CV: Denies CP, palpitations, SOB, orthopnea  +chest pressure Pulm: Denies SOB, cough, wheezing GI: Denies abdominal pain, nausea, vomiting,  diarrhea, constipation  +GERD GU: Denies dysuria, hematuria, frequency, vaginal discharge Msk: Denies muscle cramps, joint pains  +muscle pain Neuro: Denies weakness  +numbness, tingling on 1st and 2nd digits L hand Skin: Denies rashes, bruising Psych: Denies depression, anxiety, hallucinations    Objective:    Blood pressure (!) 150/80, pulse (!) 55, temperature 98.6 F (37 C), temperature source Oral, weight 172 lb 6.4 oz (78.2 kg), SpO2 98 %.  Gen. Pleasant, well-nourished, in no distress, normal affect   HEENT: [place]/AT, face symmetric, conjunctiva clear, no scleral icterus, PERRLA, EOMI, nares patent without drainage Neck: No JVD, no thyromegaly, no carotid bruits Lungs: no accessory muscle use, CTAB, no wheezes or rales Cardiovascular: RRR, no m/r/g, no peripheral edema Musculoskeletal: No deformities, no cyanosis or clubbing, normal tone Neuro:  A&Ox3, CN II-XII intact, normal gait Skin:  Warm, no lesions/ rash   Wt Readings from Last 3 Encounters:  12/11/21 172 lb 6.4 oz (78.2 kg)  12/04/21 170 lb 6.4 oz (77.3 kg)  11/26/21 172 lb (78 kg)    Lab Results  Component Value Date   WBC 6.2 12/01/2021   HGB 13.6 12/01/2021   HCT 40.1 12/01/2021   PLT 262 12/01/2021   GLUCOSE 207 (H) 12/01/2021   CHOL 253 (H) 01/15/2021   TRIG  133 01/15/2021   HDL 51 01/15/2021   LDLCALC 178 (H) 01/15/2021   ALT 14 11/26/2021   AST 16 11/26/2021   NA 137 12/01/2021   K 3.7 12/01/2021   CL 104 12/01/2021   CREATININE 1.13 (H) 12/01/2021   BUN 18 12/01/2021   CO2 22 12/01/2021   TSH 1.94 11/10/2021   INR 1.06 01/16/2015   HGBA1C 6.7 (H) 11/10/2021    Assessment/Plan:  Pulmonary emphysema, unspecified emphysema type (HCC) -Emphysema noted on CXR 10/03/2020 -History of tobacco use -Continue Symbicort - Plan: Ambulatory referral to Pulmonology  SOB (shortness of breath) - Plan: Ambulatory referral to Pulmonology  Near syncope -Discussed symptoms likely 2/2 hypoglycemia or  hypotension. -Discussed the importance of eating regular meals and staying hydrated -Continue to monitor - Plan: POCT urinalysis dipstick  Essential hypertension -Uncontrolled, though pt typically well controlled.  BP 134/84 on 12/04/2021. -Recheck -Discussed importance of taking medication -Continue Norvasc 5 mg daily, olmesartan 5 mg daily. -Patient encouraged to monitor BP at home.  For continued elevation consistently greater than 140/90 will increase olmesartan.  Type 2 diabetes mellitus with other specified complication, without long-term current use of insulin (HCC) -Diet controlled not currently on medication. -Hemoglobin A1c 6.7% on 11/10/2021 -Continue lifestyle modifications -Crestor 20 mg daily  - Plan: POCT urinalysis dipstick  Paresthesia -Encouraged to keep upcoming follow-up with neurosurgery  Myalgia -Consider holding Crestor 20 mg  Gastroesophageal reflux disease, unspecified whether esophagitis present -Given increased GERD symptoms unrelieved by PPI or H2 blocker discussed the importance of follow-up with GI. - Plan: Ambulatory referral to Gastroenterology  F/u as needed.  Grier Mitts, MD

## 2021-12-12 ENCOUNTER — Telehealth: Payer: Self-pay

## 2021-12-12 NOTE — Telephone Encounter (Signed)
Pt needs to flup with Dr Ardis Hughs in Oct 2023 dx diverticulosis.   Left message for patient to return call to schedule follow up appointment.  Will continue efforts.

## 2021-12-12 NOTE — Telephone Encounter (Signed)
Patient returned call and scheduled appointment on 02-17-22 at 8:50am.

## 2021-12-16 ENCOUNTER — Ambulatory Visit: Payer: Medicare Other | Admitting: Physician Assistant

## 2021-12-16 DIAGNOSIS — G5601 Carpal tunnel syndrome, right upper limb: Secondary | ICD-10-CM | POA: Diagnosis not present

## 2021-12-16 DIAGNOSIS — G5602 Carpal tunnel syndrome, left upper limb: Secondary | ICD-10-CM | POA: Diagnosis not present

## 2021-12-18 ENCOUNTER — Other Ambulatory Visit: Payer: Self-pay | Admitting: Physician Assistant

## 2021-12-18 ENCOUNTER — Ambulatory Visit (HOSPITAL_BASED_OUTPATIENT_CLINIC_OR_DEPARTMENT_OTHER): Payer: Medicare Other

## 2021-12-18 ENCOUNTER — Ambulatory Visit (HOSPITAL_COMMUNITY)
Admission: RE | Admit: 2021-12-18 | Discharge: 2021-12-18 | Disposition: A | Discharge status: Home / Self Care | Payer: Medicare Other | Source: Ambulatory Visit | Attending: Cardiovascular Disease | Admitting: Cardiovascular Disease

## 2021-12-18 DIAGNOSIS — I428 Other cardiomyopathies: Secondary | ICD-10-CM

## 2021-12-18 DIAGNOSIS — I6523 Occlusion and stenosis of bilateral carotid arteries: Secondary | ICD-10-CM

## 2021-12-18 DIAGNOSIS — R0609 Other forms of dyspnea: Secondary | ICD-10-CM

## 2021-12-18 DIAGNOSIS — R072 Precordial pain: Secondary | ICD-10-CM

## 2021-12-18 DIAGNOSIS — I10 Essential (primary) hypertension: Secondary | ICD-10-CM

## 2021-12-18 LAB — ECHOCARDIOGRAM COMPLETE
Area-P 1/2: 2.58 cm2
P 1/2 time: 447 msec
S' Lateral: 3.4 cm

## 2021-12-23 ENCOUNTER — Encounter: Payer: Self-pay | Admitting: Family Medicine

## 2021-12-26 ENCOUNTER — Encounter: Payer: Self-pay | Admitting: Family Medicine

## 2021-12-26 ENCOUNTER — Ambulatory Visit (INDEPENDENT_AMBULATORY_CARE_PROVIDER_SITE_OTHER): Payer: Medicare Other | Admitting: Family Medicine

## 2021-12-26 VITALS — BP 146/92 | HR 64 | Temp 98.5°F | Wt 175.2 lb

## 2021-12-26 DIAGNOSIS — R0602 Shortness of breath: Secondary | ICD-10-CM | POA: Diagnosis not present

## 2021-12-26 DIAGNOSIS — I10 Essential (primary) hypertension: Secondary | ICD-10-CM | POA: Diagnosis not present

## 2021-12-26 DIAGNOSIS — T50905A Adverse effect of unspecified drugs, medicaments and biological substances, initial encounter: Secondary | ICD-10-CM

## 2021-12-26 NOTE — Progress Notes (Signed)
Subjective:    Patient ID: Angela Pugh, female    DOB: 23-Aug-1945, 76 y.o.   MRN: [id number]  Chief Complaint  Patient presents with   Medication Reaction    Symbicort - tightness in throat, sore throat could hardly talk, pain in chest. Has not used it since Tuesday and is feeling slightly better. Also was wheezing, could not walk far before feeling like she was out of reathe.    HPI Patient was seen today for acute visit.  Patient had allergic reaction to Symbicort after using x 2 days.  Medication caused sore throat, swelling in throat, hoarseness of voice, tightness in chest, and lips turned black-occurs when pt is sick.  Patient notes resolution of symptoms after stopping medication.  Pt has appointment with pulmonology next week.  Past Medical History:  Diagnosis Date   Arthritis    back, knees, shoulder, hands , injections in pelvis, for bone spurs (05/04/2016)   Chronic back pain    Diverticulosis    History of kidney stones    HLD (hyperlipidemia)    Hypertension    Migraine    hx (05/04/2016)   Peripheral neuropathy    Radiculopathy    Sinus complaint    Sinus headache    occasional (05/04/2016)   Type II diabetes mellitus ([place])    diet controlled, no meds now, was on insulin & po treatment at one time     Vertigo     Allergies  Allergen Reactions   Acetaminophen Hives and Nausea Only   Azithromycin Hives and Nausea And Vomiting   Lipitor [Atorvastatin] Nausea Only    weakness   Zyrtec [Cetirizine]     Hives     ROS General: Denies fever, chills, night sweats, changes in weight, changes in appetite + allergic reaction HEENT: Denies headaches, ear pain, changes in vision, rhinorrhea+sore throat, hoarse voice, darkening of lips CV: Denies CP, palpitations, SOB, orthopnea Pulm: Denies SOB, cough, wheezing  + chest tightness GI: Denies abdominal pain, nausea, vomiting, diarrhea, constipation GU: Denies dysuria, hematuria, frequency, vaginal discharge Msk:  Denies muscle cramps, joint pains Neuro: Denies weakness, numbness, tingling Skin: Denies rashes, bruising Psych: Denies depression, anxiety, hallucinations     Objective:    Blood pressure (!) 146/92, pulse 64, temperature 98.5 F (36.9 C), temperature source Oral, weight 175 lb 3.2 oz (79.5 kg), SpO2 96 %.  Gen. Pleasant, well-nourished, in no distress, normal affect   HEENT: [place]/AT, face symmetric, conjunctiva clear, no scleral icterus, PERRLA, EOMI, nares patent without drainage, pharynx with mild erythema, no exudate. Lungs: no accessory muscle use, CTAB, no wheezes or rales Cardiovascular: RRR, no m/r/g, no peripheral edema Abdomen: BS present, soft, NT/ND Musculoskeletal: No deformities, no cyanosis or clubbing, normal tone Neuro:  A&Ox3, CN II-XII intact, normal gait Skin:  Warm, no lesions/ rash   Wt Readings from Last 3 Encounters:  12/26/21 175 lb 3.2 oz (79.5 kg)  12/11/21 172 lb 6.4 oz (78.2 kg)  12/04/21 170 lb 6.4 oz (77.3 kg)    Lab Results  Component Value Date   WBC 6.2 12/01/2021   HGB 13.6 12/01/2021   HCT 40.1 12/01/2021   PLT 262 12/01/2021   GLUCOSE 207 (H) 12/01/2021   CHOL 253 (H) 01/15/2021   TRIG 133 01/15/2021   HDL 51 01/15/2021   LDLCALC 178 (H) 01/15/2021   ALT 14 11/26/2021   AST 16 11/26/2021   NA 137 12/01/2021   K 3.7 12/01/2021   CL 104 12/01/2021  CREATININE 1.13 (H) 12/01/2021   BUN 18 12/01/2021   CO2 22 12/01/2021   TSH 1.94 11/10/2021   INR 1.06 01/16/2015   HGBA1C 6.7 (H) 11/10/2021    Assessment/Plan:  Adverse effect of drug, initial encounter -Symbicort discontinued and added to allergy list -Symptoms have resolved will not start prednisone. -Continue to monitor  SOB (shortness of breath) -Improving -Encouraged to keep follow-up appointment with pulmonology  Essential hypertension -Elevated -Recheck -Continue Norvasc 5 mg and olmesartan 5 mg. -For continued elevation increase olmesartan  F/u as  needed  Grier Mitts, MD

## 2022-01-01 ENCOUNTER — Telehealth: Payer: Self-pay | Admitting: Pharmacist

## 2022-01-01 ENCOUNTER — Ambulatory Visit (INDEPENDENT_AMBULATORY_CARE_PROVIDER_SITE_OTHER): Payer: Medicare Other | Admitting: Pulmonary Disease

## 2022-01-01 ENCOUNTER — Telehealth: Payer: Self-pay | Admitting: Family Medicine

## 2022-01-01 ENCOUNTER — Encounter: Payer: Self-pay | Admitting: Pulmonary Disease

## 2022-01-01 ENCOUNTER — Telehealth: Payer: Self-pay | Admitting: Gastroenterology

## 2022-01-01 VITALS — BP 132/76 | HR 61 | Ht 69.0 in | Wt 175.0 lb

## 2022-01-01 DIAGNOSIS — R0602 Shortness of breath: Secondary | ICD-10-CM

## 2022-01-01 DIAGNOSIS — K219 Gastro-esophageal reflux disease without esophagitis: Secondary | ICD-10-CM

## 2022-01-01 NOTE — Telephone Encounter (Signed)
I spoke with the pt to discuss her concerns of reflux. I asked if she was taking her protonix 40 mg daily and she said " I am taking the damn medicine and its not working. If you can't prescribe something I will find another MD."  I did see that she was rescheduled from 10/5 with Dr Ardis Hughs to 9/5 with Amy.  I asked if she was aware that her appt was moved sooner and she again stated that she was calling her primary care to get another doctor.  I tried to calm her and ask about her diet and give her some anti reflux precautions and was going to tell her that she could try pepcid at bedtime but she refused to let me speak and hung up.

## 2022-01-01 NOTE — Progress Notes (Signed)
Synopsis: Referred in 12/2021 for dyspnea and emphysema. She has a history of non-ischemic cardiomyopathy followed by [facility] heart care.  Her LVEF in 02/2018 was 35-40%, improved to 50% by 2023. She smoked < 1ppd menthol cigarettes for 20 years regularly quit over 30 years ago.   Subjective:   PATIENT ID: Angela Pugh GENDER: female DOB: [birth year], MRN: [id number]   HPI  Chief Complaint  Patient presents with   Consult    Referred by PCP for possible emphysema. Was on Symbicort but stopped due to an allergic reaction.    Referred for shortness of breath: >Has been going on for 3 months >Resting makes it better >Tried taking Symbicort, did not tolerate > gave her a scratchy throat so she stopped taking it > usually doesn't wheeze > she says that she sometimes has trouble breathing > typically it's with exertion, not with rest > She says it's a little unpredictable > associated with tightness in her chest > She has learned to work her life around the dyspnea so right now it's not limiting her activities, she'll garden a lot and keep up with her grandkid > she can carry in groceries, climb a flight of stairs  She will occassionaly have a dry, hacking cough: > rarely she'll produce clear mucus, but this doesn't happen much.  She has a lot of sinus congestion > takes mucinex daily which helps  She thinks that the pain she feels in her chest is most likely acid reflux.  It's typically 1.5 hours after she eats, burning in sensation and relieved by gasX.  A few weeks ago she had an episode where she had a cold and it lead to worsening sinus congestion and dyspnea.  She says it passed on her own.   Recently she had an episode where she had some indigestion and her family called 41.  She went to ER and was told there was nothing wrong with her heart.  She feels like her indigestion has been getting worse in the last. Pepcid didn't help so she switched to protonix which helped     Records from July and August visit with primary care where she was seen for multiple issues including dyspnea, continued on symbicort, noted to have emphysema on a CXR, referred to [place] for further management.   Past Medical History:  Diagnosis Date   Arthritis    back, knees, shoulder, hands , injections in pelvis, for bone spurs (05/04/2016)   Chronic back pain    Diverticulosis    History of kidney stones    HLD (hyperlipidemia)    Hypertension    Migraine    hx (05/04/2016)   Peripheral neuropathy    Radiculopathy    Sinus complaint    Sinus headache    occasional (05/04/2016)   Type II diabetes mellitus ([place])    diet controlled, no meds now, was on insulin & po treatment at one time     Vertigo      Family History  Problem Relation Age of Onset   Uterine cancer Mother    Diabetes type II Sister    Diabetes Mellitus II Brother    Colon cancer Neg Hx    Pancreatic cancer Neg Hx    Esophageal cancer Neg Hx    Colon polyps Neg Hx    Rectal cancer Neg Hx    Stomach cancer Neg Hx      Social History   Socioeconomic History   Marital status: Widowed  Spouse name: Not on file   Number of children: Not on file   Years of education: Not on file   Highest education level: Not on file  Occupational History   Not on file  Tobacco Use   Smoking status: Some Days    Packs/day: 0.50    Years: 20.00    Total pack years: 10.00    Types: Cigarettes    Last attempt to quit: 06/30/1982    Years since quitting: 39.5   Smokeless tobacco: Never   Tobacco comments:    Patient only smokes in social setting when drinking. Admits former h/o heavy smoking years ago smoked 1.5 packs/day.    12/04/2021 Patient states she has not spoke for about 3 weeks  Vaping Use   Vaping Use: Never used  Substance and Sexual Activity   Alcohol use: Yes    Comment: 05/04/2016 "might have a drink a couple days/year; might not"   Drug use: No   Sexual activity: Not Currently  Other Topics  Concern   Not on file  Social History Narrative   Not on file   Social Determinants of Health   Financial Resource Strain: Low Risk  (04/22/2021)   Overall Financial Resource Strain (CARDIA)    Difficulty of Paying Living Expenses: Not hard at all  Food Insecurity: No Food Insecurity (01/28/2021)   Hunger Vital Sign    Worried About Running Out of Food in the Last Year: Never true    Ran Out of Food in the Last Year: Never true  Transportation Needs: No Transportation Needs (04/22/2021)   PRAPARE - Hydrologist (Medical): No    Lack of Transportation (Non-Medical): No  Physical Activity: Insufficiently Active (01/28/2021)   Exercise Vital Sign    Days of Exercise per Week: 5 days    Minutes of Exercise per Session: 20 min  Stress: No Stress Concern Present (01/28/2021)   [place]    Feeling of Stress : Not at all  Social Connections: Moderately Isolated (01/28/2021)   Social Connection and Isolation Panel [NHANES]    Frequency of Communication with Friends and Family: More than three times a week    Frequency of Social Gatherings with Friends and Family: More than three times a week    Attends Religious Services: More than 4 times per year    Active Member of [employer] or Organizations: No    Attends Archivist Meetings: Never    Marital Status: Widowed  Intimate Partner Violence: Not At Risk (01/28/2021)   Humiliation, Afraid, Rape, and Kick questionnaire    Fear of Current or Ex-Partner: No    Emotionally Abused: No    Physically Abused: No    Sexually Abused: No     Allergies  Allergen Reactions   Symbicort [Budesonide-Formoterol Fumarate] Shortness Of Breath, Swelling and Other (See Comments)    Pharyngeal edema, hoarseness, tightness in chest, lips became dark.   Acetaminophen Hives and Nausea Only   Azithromycin Hives and Nausea And Vomiting   Lipitor [Atorvastatin]  Nausea Only    weakness   Zyrtec [Cetirizine]     Hives      Outpatient Medications Prior to Visit  Medication Sig Dispense Refill   amLODipine (NORVASC) 5 MG tablet TAKE 1 TABLET(5 MG) BY MOUTH DAILY 90 tablet 1   aspirin 81 MG EC tablet Take 81 mg by mouth daily. Swallow whole.     Calcium Carb-Cholecalciferol (CALCIUM  1000 + D PO)      celecoxib (CELEBREX) 200 MG capsule Take 1 capsule (200 mg total) by mouth daily. 90 capsule 2   diazepam (VALIUM) 5 MG tablet Take one tablet after signing papers at MRI and may repeat after 15-20 mins if necessary for anxiety. 2 tablet 0   famotidine (PEPCID) 20 MG tablet Take 1 tablet (20 mg total) by mouth 3 times/day as needed-between meals & bedtime for heartburn or indigestion. 30 tablet 0   fexofenadine (ALLEGRA ALLERGY) 180 MG tablet Take 1 tablet (180 mg total) by mouth daily. 30 tablet 3   hyoscyamine (LEVSIN SL) 0.125 MG SL tablet Take 1 to 2 tablets under the tongue as needed for cramping and abdominal bloating. 50 tablet 3   mesalamine (LIALDA) 1.2 g EC tablet TAKE 4 TABLETS(4.8 GRAMS) BY MOUTH DAILY WITH BREAKFAST 360 tablet 2   Multiple Vitamins-Minerals (CENTRUM ADULTS PO) Take 1 tablet by mouth daily.      olmesartan (BENICAR) 5 MG tablet Take 2 tablets (10 mg total) by mouth daily. 60 tablet 3   Omega-3 Fatty Acids (FISH OIL PO) 2,000 mg daily.     pantoprazole (PROTONIX) 40 MG tablet TAKE 1 TABLET(40 MG) BY MOUTH DAILY 90 tablet 1   potassium chloride SA (KLOR-CON M) 20 MEQ tablet Take 1 tablet (20 mEq total) by mouth daily. 90 tablet 1   rosuvastatin (CRESTOR) 20 MG tablet Take 1 tablet (20 mg total) by mouth daily. 90 tablet 3   triamcinolone cream (KENALOG) 0.5 % APPLY EXTERNALLY TO THE AFFECTED AREA TWICE DAILY AS NEEDED FOR DRY SKIN 30 g 3   vitamin B-12 (CYANOCOBALAMIN) 1000 MCG tablet Take 1,000 mcg by mouth daily.     amLODipine (NORVASC) 5 MG tablet Take by mouth.     No facility-administered medications prior to visit.     Review of Systems  Constitutional:  Negative for chills, fever, malaise/fatigue and weight loss.  HENT:  Positive for congestion. Negative for nosebleeds, sinus pain and sore throat.   Eyes:  Negative for photophobia, pain and discharge.  Respiratory:  Positive for cough and shortness of breath. Negative for hemoptysis, sputum production and wheezing.   Cardiovascular:  Negative for chest pain, palpitations, orthopnea and leg swelling.  Gastrointestinal:  Negative for abdominal pain, constipation, diarrhea, nausea and vomiting.  Genitourinary:  Negative for dysuria, frequency, hematuria and urgency.  Musculoskeletal:  Negative for back pain, joint pain, myalgias and neck pain.  Skin:  Negative for itching and rash.  Neurological:  Negative for tingling, tremors, sensory change, speech change, focal weakness, seizures, weakness and headaches.  Psychiatric/Behavioral:  Negative for memory loss, substance abuse and suicidal ideas. The patient is not nervous/anxious.       Objective:  Physical Exam   Vitals:   01/01/22 1352  BP: 132/76  Pulse: 61  SpO2: 99%  Weight: 175 lb (79.4 kg)  Height: '5\' 9"'$  (1.753 m)   RA  Gen: well appearing, no acute distress HENT: NCAT, OP clear, neck supple without masses Eyes: PERRL, EOMi Lymph: no cervical lymphadenopathy PULM: CTA B CV: RRR, no mgr, no JVD GI: BS+, soft, nontender, no hsm Derm: no rash or skin breakdown MSK: normal bulk and tone Neuro: A&Ox4, CN II-XII intact, strength 5/5 in all 4 extremities Psyche: normal mood and affect   CBC    Component Value Date/Time   WBC 6.2 12/01/2021 2037   RBC 4.23 12/01/2021 2037   HGB 13.6 12/01/2021 2037   HGB 14.5  03/08/2018 1612   HCT 40.1 12/01/2021 2037   HCT 43.0 03/08/2018 1612   PLT 262 12/01/2021 2037   PLT 287 03/08/2018 1612   MCV 94.8 12/01/2021 2037   MCV 91 03/08/2018 1612   MCH 32.2 12/01/2021 2037   MCHC 33.9 12/01/2021 2037   RDW 12.4 12/01/2021 2037   RDW 12.3  03/08/2018 1612   LYMPHSABS 2.1 12/01/2021 2037   LYMPHSABS 3.6 (H) 03/08/2018 1612   MONOABS 0.7 12/01/2021 2037   EOSABS 0.1 12/01/2021 2037   EOSABS 0.1 03/08/2018 1612   BASOSABS 0.1 12/01/2021 2037   BASOSABS 0.1 03/08/2018 1612     Chest imaging: July 2023 CXR personally reviewed> flattened diaphragms, interstitial infiltrate vs edema in bases, cardiomegally  PFT: August 2023 spirometry normal: Ratio 77%, FEV1 2.05 L 94% predicted  Labs: 11/2021 Hgb 13.3 gm/dL  Path:  Echo: 12/2021 LVEF 50-55%, RV normal, LA dilated  Heart Catheterization: 2019 [facility] There is mild to moderate left ventricular systolic dysfunction. LV end diastolic pressure is mildly elevated. The left ventricular ejection fraction is 35-45% by visual estimate.      Assessment & Plan:   SOB (shortness of breath) - Plan: Spirometry with graph  Gastroesophageal reflux disease, unspecified whether esophagitis present  Discussion: Pleasant 76 year old female with a prior smoking history presents to our clinic today for evaluation of exertional dyspnea, dry cough, and occasional wheezing.  Fortunately despite her prior smoking history her spirometry testing today is completely normal and walking oximetry test is normal.  So with a normal lung exam, normal spirometry and normal oximetry I see little evidence of underlying lung disease.  I think that her dyspnea is most likely related to deconditioning and I think that the occasional cough and wheeze she experiences is related to indigestion and gastroesophageal reflux disease.  Dyspnea No evidence of lung disease on today's assessment I think he should exercise as regularly as possible, see below: Exercise is an important way to stay healthy, live longer, and have a better quality of life. You should dedicate time 3-5 days a week to intentional exercise. Start by planning a workout you know you can complete.  Once you have completed it try repeating it 2 more  times in a week before you make it harder. The best and most effective exercise routines gradually get more difficult (the walk is longer, the run is faster, the weight is higher, etc.). Your consistency will guarantee success more than any other factor: stick with it no matter what.  No excuses. It's OK to feel short of breath when you exercise, but lightheadedness or pain is not OK.   If you are not comfortable exercising on your own let me know so we can find someone to help you.   Gastroesophageal Reflux I think this is the cause of the cough and wheeze you experience Continue Protonix Avoid fatty foods, alcohol, chocolate, caffeine and tobacco products No eating within 3 hours of bedtime  Follow up with me as needed      Current Outpatient Medications:    amLODipine (NORVASC) 5 MG tablet, TAKE 1 TABLET(5 MG) BY MOUTH DAILY, Disp: 90 tablet, Rfl: 1   aspirin 81 MG EC tablet, Take 81 mg by mouth daily. Swallow whole., Disp: , Rfl:    Calcium Carb-Cholecalciferol (CALCIUM 1000 + D PO), , Disp: , Rfl:    celecoxib (CELEBREX) 200 MG capsule, Take 1 capsule (200 mg total) by mouth daily., Disp: 90 capsule, Rfl: 2   diazepam (VALIUM)  5 MG tablet, Take one tablet after signing papers at MRI and may repeat after 15-20 mins if necessary for anxiety., Disp: 2 tablet, Rfl: 0   famotidine (PEPCID) 20 MG tablet, Take 1 tablet (20 mg total) by mouth 3 times/day as needed-between meals & bedtime for heartburn or indigestion., Disp: 30 tablet, Rfl: 0   fexofenadine (ALLEGRA ALLERGY) 180 MG tablet, Take 1 tablet (180 mg total) by mouth daily., Disp: 30 tablet, Rfl: 3   hyoscyamine (LEVSIN SL) 0.125 MG SL tablet, Take 1 to 2 tablets under the tongue as needed for cramping and abdominal bloating., Disp: 50 tablet, Rfl: 3   mesalamine (LIALDA) 1.2 g EC tablet, TAKE 4 TABLETS(4.8 GRAMS) BY MOUTH DAILY WITH BREAKFAST, Disp: 360 tablet, Rfl: 2   Multiple Vitamins-Minerals (CENTRUM ADULTS PO), Take 1 tablet  by mouth daily. , Disp: , Rfl:    olmesartan (BENICAR) 5 MG tablet, Take 2 tablets (10 mg total) by mouth daily., Disp: 60 tablet, Rfl: 3   Omega-3 Fatty Acids (FISH OIL PO), 2,000 mg daily., Disp: , Rfl:    pantoprazole (PROTONIX) 40 MG tablet, TAKE 1 TABLET(40 MG) BY MOUTH DAILY, Disp: 90 tablet, Rfl: 1   potassium chloride SA (KLOR-CON M) 20 MEQ tablet, Take 1 tablet (20 mEq total) by mouth daily., Disp: 90 tablet, Rfl: 1   rosuvastatin (CRESTOR) 20 MG tablet, Take 1 tablet (20 mg total) by mouth daily., Disp: 90 tablet, Rfl: 3   triamcinolone cream (KENALOG) 0.5 %, APPLY EXTERNALLY TO THE AFFECTED AREA TWICE DAILY AS NEEDED FOR DRY SKIN, Disp: 30 g, Rfl: 3   vitamin B-12 (CYANOCOBALAMIN) 1000 MCG tablet, Take 1,000 mcg by mouth daily., Disp: , Rfl:

## 2022-01-01 NOTE — Patient Instructions (Signed)
Shortness of breath No evidence of lung disease on today's assessment I think he should exercise as regularly as possible, see below: Exercise is an important way to stay healthy, live longer, and have a better quality of life. You should dedicate time 3-5 days a week to intentional exercise. Start by planning a workout you know you can complete.  Once you have completed it try repeating it 2 more times in a week before you make it harder. The best and most effective exercise routines gradually get more difficult (the walk is longer, the run is faster, the weight is higher, etc.). Your consistency will guarantee success more than any other factor: stick with it no matter what.  No excuses. It's OK to feel short of breath when you exercise, but lightheadedness or pain is not OK.   If you are not comfortable exercising on your own let me know so we can find someone to help you.   Gastroesophageal Reflux I think this is the cause of the cough and wheeze you experience Continue Protonix Avoid fatty foods, alcohol, chocolate, caffeine and tobacco products No eating within 3 hours of bedtime  Follow up with me as needed

## 2022-01-01 NOTE — Chronic Care Management (AMB) (Signed)
Chronic Care Management Pharmacy Assistant   Name: Aleka Twitty  MRN: [id number] DOB: Dec 03, 1945  Reason for Encounter: Disease State / Hypertension Assessment Call   Conditions to be addressed/monitored: HTN  Recent office visits:  12/26/2021 Grier Mitts MD - Patient was seen for Adverse effect of drug, initial encounter and additional concerns. Discontinued Symbicort. No follow up noted.   12/11/2021 Grier Mitts MD - Patient was seen for Pulmonary emphysema, unspecified emphysema type and additional concerns. Rerferral to pulmonology and gastroenterology. Discontinued Vicks vapor inhalent. Follow up in 4 weeks.   Recent consult visits:  01/01/2022 Ashok Pall MD (neurosurgery) - Patient was seen for numbness. No medication changes. No follow up noted.   Hospital visits:  None  Medications: Outpatient Encounter Medications as of 01/01/2022  Medication Sig   amLODipine (NORVASC) 5 MG tablet TAKE 1 TABLET(5 MG) BY MOUTH DAILY   aspirin 81 MG EC tablet Take 81 mg by mouth daily. Swallow whole.   Calcium Carb-Cholecalciferol (CALCIUM 1000 + D PO)    celecoxib (CELEBREX) 200 MG capsule Take 1 capsule (200 mg total) by mouth daily.   diazepam (VALIUM) 5 MG tablet Take one tablet after signing papers at MRI and may repeat after 15-20 mins if necessary for anxiety.   famotidine (PEPCID) 20 MG tablet Take 1 tablet (20 mg total) by mouth 3 times/day as needed-between meals & bedtime for heartburn or indigestion.   fexofenadine (ALLEGRA ALLERGY) 180 MG tablet Take 1 tablet (180 mg total) by mouth daily.   hyoscyamine (LEVSIN SL) 0.125 MG SL tablet Take 1 to 2 tablets under the tongue as needed for cramping and abdominal bloating.   mesalamine (LIALDA) 1.2 g EC tablet TAKE 4 TABLETS(4.8 GRAMS) BY MOUTH DAILY WITH BREAKFAST   Multiple Vitamins-Minerals (CENTRUM ADULTS PO) Take 1 tablet by mouth daily.    olmesartan (BENICAR) 5 MG tablet Take 2 tablets (10 mg total) by mouth daily.    Omega-3 Fatty Acids (FISH OIL PO) 2,000 mg daily.   pantoprazole (PROTONIX) 40 MG tablet TAKE 1 TABLET(40 MG) BY MOUTH DAILY   potassium chloride SA (KLOR-CON M) 20 MEQ tablet Take 1 tablet (20 mEq total) by mouth daily.   rosuvastatin (CRESTOR) 20 MG tablet Take 1 tablet (20 mg total) by mouth daily.   triamcinolone cream (KENALOG) 0.5 % APPLY EXTERNALLY TO THE AFFECTED AREA TWICE DAILY AS NEEDED FOR DRY SKIN   vitamin B-12 (CYANOCOBALAMIN) 1000 MCG tablet Take 1,000 mcg by mouth daily.   No facility-administered encounter medications on file as of 01/01/2022.  Fill History:  AMLODIPINE BESYLATE '5MG'$  TABLETS 10/23/2021 90   CELECOXIB '200MG'$  CAPSULES 11/19/2021 90   DIAZEPAM '5MG'$  TABLETS 05/16/2021 1   FAMOTIDINE '20MG'$  TABLETS 03/18/2020 10   ALLERGY RELF (FEXOFENADINE) '180MG'$  T 09/25/2021 30   HYOSCYAMINE 0.'125MG'$  SUBLINGUAL TABS 11/05/2020 25   MESALAMINE 1.2GM TABLETS 11/28/2021 90   PANTOPRAZOLE '40MG'$  TABLETS 11/26/2021 90   POTASSIUM CL 20MEQ ER TABLETS 10/03/2021 90   ROSUVASTATIN '20MG'$  TABLETS 12/03/2021 90   TRIAMCINOLONE 0.5% CREAM 15GM 10/02/2021 14   Reviewed chart prior to disease state call. Spoke with patient regarding BP  Recent Office Vitals: BP Readings from Last 3 Encounters:  01/01/22 132/76  12/26/21 (!) 146/92  12/11/21 (!) 150/80   Pulse Readings from Last 3 Encounters:  01/01/22 61  12/26/21 64  12/11/21 (!) 55    Wt Readings from Last 3 Encounters:  01/01/22 175 lb (79.4 kg)  12/26/21 175 lb 3.2 oz (79.5 kg)  12/11/21 172 lb 6.4 oz (78.2 kg)     Kidney Function Lab Results  Component Value Date/Time   CREATININE 1.13 (H) 12/01/2021 08:37 PM   CREATININE 0.91 11/26/2021 10:22 AM   CREATININE 0.99 (H) 01/12/2020 11:31 AM   GFR 56.58 (L) 11/10/2021 04:49 PM   GFRNONAA 51 (L) 12/01/2021 08:37 PM   GFRNONAA 57 (L) 01/12/2020 11:31 AM   GFRAA 66 01/12/2020 11:31 AM       Latest Ref Rng & Units 12/01/2021    8:37 PM 11/26/2021   10:22 AM  11/10/2021    4:49 PM  BMP  Glucose 70 - 99 mg/dL 207  141  155   BUN 8 - 23 mg/dL '18  8  16   '$ Creatinine 0.44 - 1.00 mg/dL 1.13  0.91  0.98   Sodium 135 - 145 mmol/L 137  140  138   Potassium 3.5 - 5.1 mmol/L 3.7  3.4  3.6   Chloride 98 - 111 mmol/L 104  105  102   CO2 22 - 32 mmol/L '22  25  26   '$ Calcium 8.9 - 10.3 mg/dL 9.0  9.4  9.5     Current antihypertensive regimen:  Amlodipine 5 mg daily Olmesartan 5 mg 2 daily  How often are you checking your Blood Pressure?   Current home BP readings:   What recent interventions/DTPs have been made by any provider to improve Blood Pressure control since last CPP Visit: No recent interventions  Any recent hospitalizations or ED visits since last visit with CPP? No recent hospital visits.  What diet changes have been made to improve Blood Pressure Control?  Patient follows Beatrice exercise is being done to improve your Blood Pressure Control?    Adherence Review: Is the patient currently on ACE/ARB medication? No Does the patient have >5 day gap between last estimated fill dates? No  Patient declined doing the assessment today, she states she will see Dr. Volanda Napoleon on 01/08/22, patient also declined scheduling follow up with Jeni Salles, clinical pharmacist. Patient states she wants to see Dr. Volanda Napoleon first and then decide if she needs to make any appointments.   Care Gaps: AWV - scheduled 02/10/2022 Last BP - 132/73 on 01/01/2022 Last A1C - 6.7 on 11/10/2021 Pneumonia vaccine - never done Hep C Screen - never done Tdap - never done Shingrix - never done Covid booster - overdue Eye exam - overdue Flu - due   Star Rating Drugs: Rosuvastatin 20 mg - last filled 12/03/2021 90 DS at [facility] Pharmacist Assistant [phone]

## 2022-01-01 NOTE — Telephone Encounter (Signed)
Patient was scheduled for 10/3 with Dr. Ardis Hughs and was rescheduled for 9/5 at 9:00 with Amy. Patient stated that she is having issues with acid reflux and is seeking advice if she can get acid reflux medication. Please advise.

## 2022-01-01 NOTE — Telephone Encounter (Signed)
Pt is calling and can not see dr Edison Nasuti assistant until 01-20-2022 and would like to know if md would call in something for her acid reflux the pantoprazole (PROTONIX) 40 MG tablet that dr Edison Nasuti gave her is not working and pt has an appt with dr banks next week on 01-08-2022 [facility] [place], [place] Phone:  [phone]  Fax:  [phone]

## 2022-01-02 ENCOUNTER — Other Ambulatory Visit: Payer: Self-pay | Admitting: Family Medicine

## 2022-01-02 DIAGNOSIS — K219 Gastro-esophageal reflux disease without esophagitis: Secondary | ICD-10-CM

## 2022-01-02 MED ORDER — FAMOTIDINE 20 MG PO TABS: 20.0000 mg | ORAL_TABLET | Freq: Two times a day (BID) | ORAL | 2 refills | Status: DC

## 2022-01-02 NOTE — Telephone Encounter (Signed)
RX for pepcid 20 mg BID sent to pt's pharmacy.

## 2022-01-02 NOTE — Progress Notes (Signed)
Rx for pepcid 20 mg BID sent to pharmacy as pt states protonix 40 mg is not helping.  Has appt with GI in a few wks.  Grier Mitts, MD

## 2022-01-06 ENCOUNTER — Other Ambulatory Visit: Payer: Self-pay | Admitting: Family Medicine

## 2022-01-08 ENCOUNTER — Ambulatory Visit (INDEPENDENT_AMBULATORY_CARE_PROVIDER_SITE_OTHER): Payer: Medicare Other | Admitting: Family Medicine

## 2022-01-08 VITALS — BP 132/88 | HR 56 | Temp 97.9°F | Wt 177.4 lb

## 2022-01-08 DIAGNOSIS — L409 Psoriasis, unspecified: Secondary | ICD-10-CM

## 2022-01-08 DIAGNOSIS — K219 Gastro-esophageal reflux disease without esophagitis: Secondary | ICD-10-CM | POA: Diagnosis not present

## 2022-01-08 MED ORDER — CLOBETASOL PROPIONATE 0.05 % EX OINT: 1.0000 | TOPICAL_OINTMENT | Freq: Two times a day (BID) | CUTANEOUS | 0 refills | Status: DC

## 2022-01-08 NOTE — Progress Notes (Signed)
Subjective:    Patient ID: Angela Pugh, female    DOB: May 29, 1945, 76 y.o.   MRN: [id number]  Chief Complaint  Patient presents with   Follow-up    States she is feeling better. Right 2 fingers are hurting    HPI Patient is a 76 year old female with PMH SIG HTN, migraines, peripheral neuropathy, radiculopathy, HLD, diverticulosis, renal calculi, arthritis, DM 2, vertigo who was seen today for f/u on SOB.  Seen for SOB on 12/11/2021.  Started on Symbicort inhaler given history of emphysema on imaging.  Med d/c'd 2/2 allergic reaction.  Patient seen by pulmonology 01/01/2022 for SOB thought 2/2 GERD.   Pt states acid reflux is becoming worse.  Having symptoms with all foods.  Endorses burning in chest, acid taste in mouth, foul-smelling stools, decreased appetite.  Currently taking Pepcid 3 times daily and Protonix 1-2 times per day.  Scheduled to see GI 01/20/2022.  Patient states she is eating smaller meals and snacks throughout the day.  Has dinner by 5:30 or 6 PM but often wakes up around 230-3 AM and has a small snack.  If patient does not eat at night she feels sick.  Patient denies blood in stools, emesis, abdominal pain, fever.  Patient notes peeling of right second and third digits becoming worse.  States develops a rash on fingers that feels like bumps underneath the skin.  The skin then starts peeling, becomes sore and cracked.  In the past triamcinolone cream in helped but is no longer working.   Pt also notes intermittent painless swelling of R lateral abdomen.  Past Medical History:  Diagnosis Date   Arthritis    back, knees, shoulder, hands , injections in pelvis, for bone spurs (05/04/2016)   Chronic back pain    Diverticulosis    History of kidney stones    HLD (hyperlipidemia)    Hypertension    Migraine    hx (05/04/2016)   Peripheral neuropathy    Radiculopathy    Sinus complaint    Sinus headache    occasional (05/04/2016)   Type II diabetes mellitus (HCC)     diet controlled, no meds now, was on insulin & po treatment at one time     Vertigo     Allergies  Allergen Reactions   Symbicort [Budesonide-Formoterol Fumarate] Shortness Of Breath, Swelling and Other (See Comments)    Pharyngeal edema, hoarseness, tightness in chest, lips became dark.   Acetaminophen Hives and Nausea Only   Azithromycin Hives and Nausea And Vomiting   Lipitor [Atorvastatin] Nausea Only    weakness   Zyrtec [Cetirizine]     Hives     ROS General: Denies fever, chills, night sweats, changes in weight, changes in appetite HEENT: Denies headaches, ear pain, changes in vision, rhinorrhea, sore throat CV: Denies CP, palpitations, SOB, orthopnea Pulm: Denies SOB, cough, wheezing GI: Denies abdominal pain, nausea, vomiting, diarrhea, constipation +GERD GU: Denies dysuria, hematuria, frequency, vaginal discharge Msk: Denies muscle cramps, joint pains Neuro: Denies weakness, numbness, tingling Skin: Denies rashes, bruising +rash on R 2nd and 3rd digits Psych: Denies depression, anxiety, hallucinations     Objective:    Blood pressure 132/88, pulse (!) 56, temperature 97.9 F (36.6 C), temperature source Oral, weight 177 lb 6.4 oz (80.5 kg), SpO2 94 %.  Gen. Pleasant, well-nourished, in no distress, normal affect   HEENT: [place]/AT, face symmetric, conjunctiva clear, no scleral icterus, PERRLA, EOMI, nares patent without drainage, pharynx without erythema or exudate. Lungs: no accessory  muscle use, CTAB, no wheezes or rales Cardiovascular: RRR, no m/r/g, no peripheral edema Musculoskeletal: No deformities, no cyanosis or clubbing, normal tone Neuro:  A&Ox3, CN II-XII intact, normal gait Skin:  Warm, no lesions/ rash   Wt Readings from Last 3 Encounters:  01/08/22 177 lb 6.4 oz (80.5 kg)  01/01/22 175 lb (79.4 kg)  12/26/21 175 lb 3.2 oz (79.5 kg)    Lab Results  Component Value Date   WBC 6.2 12/01/2021   HGB 13.6 12/01/2021   HCT 40.1 12/01/2021   PLT 262  12/01/2021   GLUCOSE 207 (H) 12/01/2021   CHOL 253 (H) 01/15/2021   TRIG 133 01/15/2021   HDL 51 01/15/2021   LDLCALC 178 (H) 01/15/2021   ALT 14 11/26/2021   AST 16 11/26/2021   NA 137 12/01/2021   K 3.7 12/01/2021   CL 104 12/01/2021   CREATININE 1.13 (H) 12/01/2021   BUN 18 12/01/2021   CO2 22 12/01/2021   TSH 1.94 11/10/2021   INR 1.06 01/16/2015   HGBA1C 6.7 (H) 11/10/2021    Assessment/Plan:  Psoriasis, unspecified  - Plan: clobetasol ointment (TEMOVATE) 0.05 %, Ambulatory referral to Dermatology  Gastroesophageal reflux disease, unspecified whether esophagitis present -avoid foods known to cause symptoms.  -encouraged to keep f/u with GI as EGD likely needed.  F/u prn  Grier Mitts, MD

## 2022-01-09 ENCOUNTER — Telehealth: Payer: Self-pay | Admitting: Family Medicine

## 2022-01-09 NOTE — Telephone Encounter (Signed)
Pt called to request a referral to see the dermatologist, as per conversation had during last office visit on 01/08/22.  Please advise.  [phone]

## 2022-01-10 ENCOUNTER — Other Ambulatory Visit: Payer: Self-pay | Admitting: Family Medicine

## 2022-01-10 DIAGNOSIS — L853 Xerosis cutis: Secondary | ICD-10-CM

## 2022-01-12 NOTE — Telephone Encounter (Signed)
Referral placed by provider.

## 2022-01-13 DIAGNOSIS — H469 Unspecified optic neuritis: Secondary | ICD-10-CM | POA: Diagnosis not present

## 2022-01-13 DIAGNOSIS — E119 Type 2 diabetes mellitus without complications: Secondary | ICD-10-CM | POA: Diagnosis not present

## 2022-01-13 DIAGNOSIS — H40013 Open angle with borderline findings, low risk, bilateral: Secondary | ICD-10-CM | POA: Diagnosis not present

## 2022-01-13 DIAGNOSIS — H25813 Combined forms of age-related cataract, bilateral: Secondary | ICD-10-CM | POA: Diagnosis not present

## 2022-01-20 ENCOUNTER — Encounter: Payer: Self-pay | Admitting: Physician Assistant

## 2022-01-20 ENCOUNTER — Ambulatory Visit (INDEPENDENT_AMBULATORY_CARE_PROVIDER_SITE_OTHER): Payer: Medicare Other | Admitting: Physician Assistant

## 2022-01-20 VITALS — BP 146/82 | HR 56 | Ht 69.0 in | Wt 178.0 lb

## 2022-01-20 DIAGNOSIS — R0989 Other specified symptoms and signs involving the circulatory and respiratory systems: Secondary | ICD-10-CM | POA: Diagnosis not present

## 2022-01-20 DIAGNOSIS — R1310 Dysphagia, unspecified: Secondary | ICD-10-CM | POA: Diagnosis not present

## 2022-01-20 DIAGNOSIS — K219 Gastro-esophageal reflux disease without esophagitis: Secondary | ICD-10-CM | POA: Diagnosis not present

## 2022-01-20 DIAGNOSIS — R12 Heartburn: Secondary | ICD-10-CM | POA: Diagnosis not present

## 2022-01-20 MED ORDER — PANTOPRAZOLE SODIUM 40 MG PO TBEC: 40.0000 mg | DELAYED_RELEASE_TABLET | Freq: Two times a day (BID) | ORAL | 6 refills | Status: DC

## 2022-01-20 NOTE — Progress Notes (Signed)
Subjective:    Patient ID: Angela Pugh, female    DOB: [birth year], 76 y.o.   MRN: [id number]  HPI  Angela Pugh is a 76 year old African-American female, established with Dr. Ardis Hughs who was last seen in October 2022.  She has history of chronic GERD, and significant diverticular disease with probable associated SCAD for which she has been on Lialda. She comes in today with complaints of unrelenting GERD symptoms with heartburn indigestion and episodes of chest discomfort which have been occurring a couple of times daily.  She initially started having symptoms about 6 months ago.  She says she has been to her cardiologist and through pulmonary with no worrisome findings and says GI is her last stop.  She says she had to go to the ER at 1 point due to symptoms.  Which she is primarily describing is a full feeling in her lower throat area which makes her feel like she is going to choke.  This is happening intermittently throughout the day somewhat progressive over the past few months. She had been on long-term Protonix 40 mg p.o. every morning and said that had worked well until 5 to 6 months ago.  She has also developed a foul odor to her breath.  She reports occasional episodes of regurgitation.  Symptoms are frequently worse at night with a sensation of fullness in the throat especially with lying down.  She says she cannot eat as much as she used to again because of this full feeling in the throat and esophagus. In addition to Protonix 40 mg which she is frequently now taking twice a day she has also been using 20 mg Pepcid a couple of times per day to try to help control symptoms. She is on Celebrex for arthritis symptoms  She had EGD in December 2021 with finding of a small blue bleb in the distal esophagus, mild gastritis, no H. pylori.  Gastric biopsies showed focal intestinal metaplasia no dysplasia and no H. pylori. Colonoscopy at that same time with significant chronic diverticular changes  multiple small and large diverticuli especially in the sigmoid colon with associated luminal narrowing and spasticity, no polyps.  She did have an echo in August 2023 with EF 50 to 55%, no aortic stenosis Office visit with pulmonary/Dr. Lake Bells August 2023 with normal PFTs, hemoglobin 13.3, chest x-ray July 2023 with flattened diaphragms interstitial infiltrate versus edema in the bases and cardiomegaly.  Not felt to have any significant lung disease.  Cath 2019-essentially normal coronary arteries with mild to moderate LV dysfunction.  Review of Systems. Pertinent positive and negative review of systems were noted in the above HPI section.  All other review of systems was otherwise negative.   Outpatient Encounter Medications as of 01/20/2022  Medication Sig   amLODipine (NORVASC) 5 MG tablet TAKE 1 TABLET(5 MG) BY MOUTH DAILY   aspirin 81 MG EC tablet Take 81 mg by mouth daily. Swallow whole.   Calcium Carb-Cholecalciferol (CALCIUM 1000 + D PO)    celecoxib (CELEBREX) 200 MG capsule Take 1 capsule (200 mg total) by mouth daily.   clobetasol ointment (TEMOVATE) 6.44 % Apply 1 Application topically 2 (two) times daily.   diazepam (VALIUM) 5 MG tablet Take one tablet after signing papers at MRI and may repeat after 15-20 mins if necessary for anxiety.   famotidine (PEPCID) 20 MG tablet Take 1 tablet (20 mg total) by mouth 2 (two) times daily.   fexofenadine (ALLEGRA ALLERGY) 180 MG tablet Take 1 tablet (180  mg total) by mouth daily.   hyoscyamine (LEVSIN SL) 0.125 MG SL tablet Take 1 to 2 tablets under the tongue as needed for cramping and abdominal bloating.   mesalamine (LIALDA) 1.2 g EC tablet TAKE 4 TABLETS(4.8 GRAMS) BY MOUTH DAILY WITH BREAKFAST   Multiple Vitamins-Minerals (CENTRUM ADULTS PO) Take 1 tablet by mouth daily.    olmesartan (BENICAR) 5 MG tablet TAKE 2 TABLETS(10 MG) BY MOUTH DAILY   Omega-3 Fatty Acids (FISH OIL PO) 2,000 mg daily.   potassium chloride SA (KLOR-CON M) 20 MEQ  tablet TAKE 1 TABLET(20 MEQ TOTAL) BY MOUTH DAILY   rosuvastatin (CRESTOR) 20 MG tablet Take 1 tablet (20 mg total) by mouth daily.   triamcinolone cream (KENALOG) 0.5 % APPLY TOPICALLY TO THE AFFECTED AREA TWICE DAILY AS NEEDED FOR DRY SKIN   vitamin B-12 (CYANOCOBALAMIN) 1000 MCG tablet Take 1,000 mcg by mouth daily.   [DISCONTINUED] pantoprazole (PROTONIX) 40 MG tablet TAKE 1 TABLET(40 MG) BY MOUTH DAILY   pantoprazole (PROTONIX) 40 MG tablet Take 1 tablet (40 mg total) by mouth 2 (two) times daily before a meal.   No facility-administered encounter medications on file as of 01/20/2022.   Allergies  Allergen Reactions   Symbicort [Budesonide-Formoterol Fumarate] Shortness Of Breath, Swelling and Other (See Comments)    Pharyngeal edema, hoarseness, tightness in chest, lips became dark.   Acetaminophen Hives and Nausea Only   Azithromycin Hives and Nausea And Vomiting   Lipitor [Atorvastatin] Nausea Only    weakness   Zyrtec [Cetirizine]     Hives    Patient Active Problem List   Diagnosis Date Noted   Headache above the eye region 02/19/2021   Nasal congestion 02/19/2021   Emphysema lung ([place]) 10/04/2020   Abdominal pain, epigastric 03/18/2020   Low back pain without sciatica 10/30/2019   Body mass index (BMI) 26.0-26.9, adult 10/23/2019   Nonischemic cardiomyopathy ([place]) 04/12/2018   Chest pain 01/25/2018   Left bundle branch block 01/25/2018   Carotid artery disease ([place]) 01/25/2018   Seasonal allergies 10/18/2017   Arthritis 10/18/2017   Pre-diabetes 10/18/2017   Protein-calorie malnutrition, severe 05/05/2016   Dysphagia 05/04/2016   HNP (herniated nucleus pulposus) with myelopathy, cervical 04/17/2016   Abdominal pain 02/01/2016   Acute diverticulitis 02/01/2016   HLD (hyperlipidemia) 02/01/2016   Diabetes mellitus without complication ([place]) 16/02/9603   Hypokalemia 02/01/2016   Hypertension    Cervical spondylosis without myelopathy 01/13/2016   Bilateral carpal  tunnel syndrome 01/13/2016   Herniated lumbar intervertebral disc 06/30/2013   Social History   Socioeconomic History   Marital status: Widowed    Spouse name: Not on file   Number of children: Not on file   Years of education: Not on file   Highest education level: Not on file  Occupational History   Not on file  Tobacco Use   Smoking status: Some Days    Packs/day: 0.50    Years: 20.00    Total pack years: 10.00    Types: Cigarettes    Last attempt to quit: 06/30/1982    Years since quitting: 39.5   Smokeless tobacco: Never   Tobacco comments:    Patient only smokes in social setting when drinking. Admits former h/o heavy smoking years ago smoked 1.5 packs/day.    12/04/2021 Patient states she has not spoke for about 3 weeks  Vaping Use   Vaping Use: Never used  Substance and Sexual Activity   Alcohol use: Yes    Comment: 05/04/2016 "might  have a drink a couple days/year; might not"   Drug use: No   Sexual activity: Not Currently  Other Topics Concern   Not on file  Social History Narrative   Not on file   Social Determinants of Health   Financial Resource Strain: Low Risk  (04/22/2021)   Overall Financial Resource Strain (CARDIA)    Difficulty of Paying Living Expenses: Not hard at all  Food Insecurity: No Food Insecurity (01/28/2021)   Hunger Vital Sign    Worried About Running Out of Food in the Last Year: Never true    Ran Out of Food in the Last Year: Never true  Transportation Needs: No Transportation Needs (04/22/2021)   PRAPARE - Hydrologist (Medical): No    Lack of Transportation (Non-Medical): No  Physical Activity: Insufficiently Active (01/28/2021)   Exercise Vital Sign    Days of Exercise per Week: 5 days    Minutes of Exercise per Session: 20 min  Stress: No Stress Concern Present (01/28/2021)   [place]    Feeling of Stress : Not at all  Social  Connections: Moderately Isolated (01/28/2021)   Social Connection and Isolation Panel [NHANES]    Frequency of Communication with Friends and Family: More than three times a week    Frequency of Social Gatherings with Friends and Family: More than three times a week    Attends Religious Services: More than 4 times per year    Active Member of [employer] or Organizations: No    Attends Archivist Meetings: Never    Marital Status: Widowed  Intimate Partner Violence: Not At Risk (01/28/2021)   Humiliation, Afraid, Rape, and Kick questionnaire    Fear of Current or Ex-Partner: No    Emotionally Abused: No    Physically Abused: No    Sexually Abused: No    Ms. Wurm's family history includes Diabetes Mellitus II in her brother; Diabetes type II in her sister; Uterine cancer in her mother.      Objective:    Vitals:   01/20/22 0840  BP: (!) 146/82  Pulse: (!) 56    Physical Exam Well-developed well-nourished older African-American female in no acute distress.  Height, Weight, 178 BMI 26.2  HEENT; nontraumatic normocephalic, EOMI, PE R LA, sclera anicteric. Oropharynx; no obvious oral thrush Neck; supple, no JVD Cardiovascular; regular rate and rhythm with S1-S2, no murmur rub or gallop Pulmonary; Clear bilaterally Abdomen; soft, nontender, nondistended, no palpable mass or hepatosplenomegaly, [place] abdominal wall hernia, bowel sounds are active Rectal; not done today Skin; benign exam, no jaundice rash or appreciable lesions Extremities; no clubbing cyanosis or edema skin warm and dry Neuro/Psych; alert and oriented x4, grossly nonfocal mood and affect appropriate        Assessment & Plan:   #46 76 year old African-American female with history of chronic GERD previously well controlled on Protonix 40 mg p.o. every morning now with 5 to 57-monthhistory of progressive symptoms with daily sensation of full feeling in the chest and lower throat makes her feel as if she is  going to choke.  Vague dysphagia, no odynophagia and has occasional episodes requiring regurgitation.  Chest fullness and discomfort consistent with heartburn  Rule out refractory GERD, rule out component of esophagitis, rule out dysmotility-motility disorder, rule out stricture  Consider underlying gastroparesis  #2 history of significant sigmoid diverticulosis with suspected SCID for which she is on Lialda  Persistent frequent  bowel movements #3 psoriasis #4.  Adult onset diabetes mellitus #5.  COPD, no O2 use normal PFTs recently #6.  Nonischemic cardiomyopathy most recent echo EF 50 to 55% #7 hypertension  Plan; increase Protonix to 40 mg p.o. AC breakfast and AC dinner be taken regularly Okay to continue famotidine 20 mg p.o. twice daily as needed Direct antireflux regimen, n.p.o. for 3 hours prior to bedtime, upright after meals, elevation of the back 45 degrees for sleep Would like her to try discontinuing Celebrex Patient will be scheduled for EGD with possible esophageal dilation with Dr. Bryan Lemma (in Dr. Eugenia Pancoast absence).  Procedure was discussed in detail with the patient including indications risks and benefits and she is agreeable to proceed. Further recommendations pending findings at EGD  Continue Lialda 4.8 g daily, She also has Levsin for as needed use.  Tajay Muzzy S Gaylon Melchor PA-C 01/20/2022   Cc: [name], MD

## 2022-01-20 NOTE — Patient Instructions (Addendum)
If you are age 75 or older, your body mass index should be between 23-30. Your Body mass index is 26.29 kg/m. If this is out of the aforementioned range listed, please consider follow up with your Primary Care Provider. ________________________________________________________  The [facility] providers would like to encourage you to use [facility] to communicate with providers for non-urgent requests or questions.  Due to long hold times on the telephone, sending your provider a message by [facility] may be a faster and more efficient way to get a response.  Please allow 48 business hours for a response.  Please remember that this is for non-urgent requests.  _______________________________________________________  Angela Pugh have been scheduled for an endoscopy. Please follow written instructions given to you at your visit today. If you use inhalers (even only as needed), please bring them with you on the day of your procedure.  Increase your Pantoprazole to twice daily before meals.  Continue Hyoscyamine and Lialda.  Follow GERD Diet and precautions  Thank you for entrusting me with your care and choosing [facility].  Amy Esterwood, PA-C

## 2022-01-23 NOTE — Progress Notes (Signed)
Agree with the assessment and plan as outlined by Amy Esterwood, PA-C.  Deasiah Hagberg, DO, FACG  

## 2022-01-25 ENCOUNTER — Encounter: Payer: Self-pay | Admitting: Family Medicine

## 2022-02-03 ENCOUNTER — Ambulatory Visit (AMBULATORY_SURGERY_CENTER): Payer: Medicare Other | Admitting: Gastroenterology

## 2022-02-03 ENCOUNTER — Encounter: Payer: Self-pay | Admitting: Gastroenterology

## 2022-02-03 VITALS — BP 116/54 | HR 75 | Temp 95.5°F | Resp 17 | Ht 69.0 in | Wt 178.0 lb

## 2022-02-03 DIAGNOSIS — K31811 Angiodysplasia of stomach and duodenum with bleeding: Secondary | ICD-10-CM | POA: Diagnosis not present

## 2022-02-03 DIAGNOSIS — K2950 Unspecified chronic gastritis without bleeding: Secondary | ICD-10-CM | POA: Diagnosis not present

## 2022-02-03 DIAGNOSIS — K222 Esophageal obstruction: Secondary | ICD-10-CM

## 2022-02-03 DIAGNOSIS — R12 Heartburn: Secondary | ICD-10-CM

## 2022-02-03 DIAGNOSIS — B9681 Helicobacter pylori [H. pylori] as the cause of diseases classified elsewhere: Secondary | ICD-10-CM | POA: Diagnosis not present

## 2022-02-03 DIAGNOSIS — R1310 Dysphagia, unspecified: Secondary | ICD-10-CM

## 2022-02-03 DIAGNOSIS — K31A0 Gastric intestinal metaplasia, unspecified: Secondary | ICD-10-CM

## 2022-02-03 DIAGNOSIS — K2970 Gastritis, unspecified, without bleeding: Secondary | ICD-10-CM

## 2022-02-03 DIAGNOSIS — K219 Gastro-esophageal reflux disease without esophagitis: Secondary | ICD-10-CM

## 2022-02-03 MED ORDER — SODIUM CHLORIDE 0.9 % IV SOLN: 500.0000 mL | Freq: Once | INTRAVENOUS | Status: DC

## 2022-02-03 NOTE — Progress Notes (Signed)
GASTROENTEROLOGY PROCEDURE H&P NOTE   Primary Care Physician: [name], MD    Reason for Procedure:   GERD, dysphagia  Plan:    EGD with possible dilation, biopsy  Patient is appropriate for endoscopic procedure(s) in the ambulatory ([place]) setting.  The nature of the procedure, as well as the risks, benefits, and alternatives were carefully and thoroughly reviewed with the patient. Ample time for discussion and questions allowed. The patient understood, was satisfied, and agreed to proceed.     HPI: Angela Pugh is a 76 y.o. female who presents for EGD for evaluation of GERD, dysphagia .  Patient was most recently seen in the Gastroenterology Clinic on 01/20/2022 by Nicoletta Ba, PA-C.  No interval change in medical history since that appointment. Please refer to that note for full details regarding GI history and clinical presentation.   Past Medical History:  Diagnosis Date   Arthritis    back, knees, shoulder, hands , injections in pelvis, for bone spurs (05/04/2016)   Chronic back pain    Diverticulosis    History of kidney stones    HLD (hyperlipidemia)    Hypertension    Migraine    hx (05/04/2016)   Peripheral neuropathy    Radiculopathy    Sinus complaint    Sinus headache    occasional (05/04/2016)   Type II diabetes mellitus (HCC)    diet controlled, no meds now, was on insulin & po treatment at one time     Vertigo     Past Surgical History:  Procedure Laterality Date   ANTERIOR CERVICAL DECOMP/DISCECTOMY FUSION N/A 04/17/2016   Procedure: ANTERIOR CERVICAL DECOMPRESSION/DISCECTOMY FUSION CERVICAL THREE - CERVICAL FOUR, POSTERIOR CERVICAL DISCECTOMY CERVICAL SEVEN -THORASIC ONE LEFT;  Surgeon: Ashok Pall, MD;  Location: [place];  Service: Neurosurgery;  Laterality: N/A;  Anterior/Posterior   ANTERIOR FUSION CERVICAL SPINE  2000   Dr. Carloyn Manner; "for bone spurs; put hardware in too"   BACK SURGERY     COLONOSCOPY  06/10/2016   [facility]   LEFT HEART  CATH AND CORONARY ANGIOGRAPHY N/A 03/17/2018   Procedure: LEFT HEART CATH AND CORONARY ANGIOGRAPHY;  Surgeon: Lorretta Harp, MD;  Location: [place] [facility];  Service: Cardiovascular;  Laterality: N/A;   LUMBAR LAMINECTOMY Left 06/30/2013   Procedure: Left L3-4 Extraforaminal approach to excise far lateral herniated nucleus pulposus;  Surgeon: Jessy Oto, MD;  Location: [place];  Service: Orthopedics;  Laterality: Left;   TUBAL LIGATION     VAGINAL HYSTERECTOMY      Prior to Admission medications   Medication Sig Start Date End Date Taking? Authorizing Provider  amLODipine (NORVASC) 5 MG tablet TAKE 1 TABLET(5 MG) BY MOUTH DAILY 10/23/21  Yes Lorretta Harp, MD  aspirin 81 MG EC tablet Take 81 mg by mouth daily. Swallow whole.   Yes [provider]  Calcium Carb-Cholecalciferol (CALCIUM 1000 + D PO)    Yes [provider]  celecoxib (CELEBREX) 200 MG capsule Take 1 capsule (200 mg total) by mouth daily. 11/19/21  Yes Jessy Oto, MD  clobetasol ointment (TEMOVATE) 1.61 % Apply 1 Application topically 2 (two) times daily. 01/08/22  Yes [name], MD  famotidine (PEPCID) 20 MG tablet Take 1 tablet (20 mg total) by mouth 2 (two) times daily. 01/02/22  Yes [name], MD  hyoscyamine (LEVSIN SL) 0.125 MG SL tablet Take 1 to 2 tablets under the tongue as needed for cramping and abdominal bloating. 04/24/21  Yes Milus Banister, MD  mesalamine (LIALDA) 1.2 g EC tablet TAKE 4 TABLETS(4.8 GRAMS) BY MOUTH DAILY WITH BREAKFAST 06/16/21  Yes Milus Banister, MD  Multiple Vitamins-Minerals (CENTRUM ADULTS PO) Take 1 tablet by mouth daily.    Yes [provider]  olmesartan (BENICAR) 5 MG tablet TAKE 2 TABLETS(10 MG) BY MOUTH DAILY 01/06/22  Yes [name], MD  Omega-3 Fatty Acids (FISH OIL PO) 2,000 mg daily.   Yes [provider]  pantoprazole (PROTONIX) 40 MG tablet Take 1 tablet (40 mg total) by mouth 2 (two) times daily before a meal. 01/20/22   Yes Esterwood, Amy S, PA-C  potassium chloride SA (KLOR-CON M) 20 MEQ tablet TAKE 1 TABLET(20 MEQ TOTAL) BY MOUTH DAILY 01/02/22  Yes [name], MD  rosuvastatin (CRESTOR) 20 MG tablet Take 1 tablet (20 mg total) by mouth daily. 12/03/21 03/03/22 Yes Lorretta Harp, MD  triamcinolone cream (KENALOG) 0.5 % APPLY TOPICALLY TO THE AFFECTED AREA TWICE DAILY AS NEEDED FOR DRY SKIN 01/12/22  Yes [name], MD  vitamin B-12 (CYANOCOBALAMIN) 1000 MCG tablet Take 1,000 mcg by mouth daily.   Yes [provider]  diazepam (VALIUM) 5 MG tablet Take one tablet after signing papers at MRI and may repeat after 15-20 mins if necessary for anxiety. 05/14/21   Jessy Oto, MD  fexofenadine [facility] ALLERGY) 180 MG tablet Take 1 tablet (180 mg total) by mouth daily. 10/03/20   [name], MD    Current Outpatient Medications  Medication Sig Dispense Refill   amLODipine (NORVASC) 5 MG tablet TAKE 1 TABLET(5 MG) BY MOUTH DAILY 90 tablet 1   aspirin 81 MG EC tablet Take 81 mg by mouth daily. Swallow whole.     Calcium Carb-Cholecalciferol (CALCIUM 1000 + D PO)      celecoxib (CELEBREX) 200 MG capsule Take 1 capsule (200 mg total) by mouth daily. 90 capsule 2   clobetasol ointment (TEMOVATE) 1.54 % Apply 1 Application topically 2 (two) times daily. 30 g 0   famotidine (PEPCID) 20 MG tablet Take 1 tablet (20 mg total) by mouth 2 (two) times daily. 60 tablet 2   hyoscyamine (LEVSIN SL) 0.125 MG SL tablet Take 1 to 2 tablets under the tongue as needed for cramping and abdominal bloating. 50 tablet 3   mesalamine (LIALDA) 1.2 g EC tablet TAKE 4 TABLETS(4.8 GRAMS) BY MOUTH DAILY WITH BREAKFAST 360 tablet 2   Multiple Vitamins-Minerals (CENTRUM ADULTS PO) Take 1 tablet by mouth daily.      olmesartan (BENICAR) 5 MG tablet TAKE 2 TABLETS(10 MG) BY MOUTH DAILY 60 tablet 3   Omega-3 Fatty Acids (FISH OIL PO) 2,000 mg daily.     pantoprazole (PROTONIX) 40 MG tablet Take 1 tablet (40 mg total) by  mouth 2 (two) times daily before a meal. 60 tablet 6   potassium chloride SA (KLOR-CON M) 20 MEQ tablet TAKE 1 TABLET(20 MEQ TOTAL) BY MOUTH DAILY 90 tablet 1   rosuvastatin (CRESTOR) 20 MG tablet Take 1 tablet (20 mg total) by mouth daily. 90 tablet 3   triamcinolone cream (KENALOG) 0.5 % APPLY TOPICALLY TO THE AFFECTED AREA TWICE DAILY AS NEEDED FOR DRY SKIN 30 g 3   vitamin B-12 (CYANOCOBALAMIN) 1000 MCG tablet Take 1,000 mcg by mouth daily.     diazepam (VALIUM) 5 MG tablet Take one tablet after signing papers at MRI and may repeat after 15-20 mins if necessary for anxiety. 2 tablet 0  fexofenadine (ALLEGRA ALLERGY) 180 MG tablet Take 1 tablet (180 mg total) by mouth daily. 30 tablet 3   Current Facility-Administered Medications  Medication Dose Route Frequency Provider Last Rate Last Admin   0.9 %  sodium chloride infusion  500 mL Intravenous Once Marshell Dilauro V, DO        Allergies as of 02/03/2022 - Review Complete 02/03/2022  Allergen Reaction Noted   Symbicort [budesonide-formoterol fumarate] Shortness Of Breath, Swelling, and Other (See Comments) 12/26/2021   Acetaminophen Hives and Nausea Only 07/02/2010   Azithromycin Hives and Nausea And Vomiting 10/18/2017   Lipitor [atorvastatin] Nausea Only 02/01/2016   Zyrtec [cetirizine]  08/25/2019    Family History  Problem Relation Age of Onset   Uterine cancer Mother    Diabetes type II Sister    Diabetes Mellitus II Brother    Colon cancer Neg Hx    Pancreatic cancer Neg Hx    Esophageal cancer Neg Hx    Colon polyps Neg Hx    Rectal cancer Neg Hx    Stomach cancer Neg Hx     Social History   Socioeconomic History   Marital status: Widowed    Spouse name: Not on file   Number of children: Not on file   Years of education: Not on file   Highest education level: Not on file  Occupational History   Not on file  Tobacco Use   Smoking status: Some Days    Packs/day: 0.50    Years: 20.00    Total pack years:  10.00    Types: Cigarettes    Last attempt to quit: 06/30/1982    Years since quitting: 39.6   Smokeless tobacco: Never   Tobacco comments:    Patient only smokes in social setting when drinking. Admits former h/o heavy smoking years ago smoked 1.5 packs/day.    12/04/2021 Patient states she has not spoke for about 3 weeks  Vaping Use   Vaping Use: Never used  Substance and Sexual Activity   Alcohol use: Yes    Comment: 05/04/2016 "might have a drink a couple days/year; might not"   Drug use: No   Sexual activity: Not Currently  Other Topics Concern   Not on file  Social History Narrative   Not on file   Social Determinants of Health   Financial Resource Strain: Low Risk  (04/22/2021)   Overall Financial Resource Strain (CARDIA)    Difficulty of Paying Living Expenses: Not hard at all  Food Insecurity: No Food Insecurity (01/28/2021)   Hunger Vital Sign    Worried About Running Out of Food in the Last Year: Never true    Ran Out of Food in the Last Year: Never true  Transportation Needs: No Transportation Needs (04/22/2021)   PRAPARE - Hydrologist (Medical): No    Lack of Transportation (Non-Medical): No  Physical Activity: Insufficiently Active (01/28/2021)   Exercise Vital Sign    Days of Exercise per Week: 5 days    Minutes of Exercise per Session: 20 min  Stress: No Stress Concern Present (01/28/2021)   [facility]    Feeling of Stress : Not at all  Social Connections: Moderately Isolated (01/28/2021)   Social Connection and Isolation Panel [NHANES]    Frequency of Communication with Friends and Family: More than three times a week    Frequency of Social Gatherings with Friends and Family: More than three times a  week    Attends Religious Services: More than 4 times per year    Active Member of Clubs or Organizations: No    Attends Archivist Meetings: Never    Marital  Status: Widowed  Intimate Partner Violence: Not At Risk (01/28/2021)   Humiliation, Afraid, Rape, and Kick questionnaire    Fear of Current or Ex-Partner: No    Emotionally Abused: No    Physically Abused: No    Sexually Abused: No    Physical Exam: Vital signs in last 24 hours: '@BP'$  125/86   Pulse 60   Temp (!) 95.5 F (35.3 C) (Temporal)   Ht '5\' 9"'$  (1.753 m)   Wt 178 lb (80.7 kg)   SpO2 100%   BMI 26.29 kg/m  GEN: NAD EYE: Sclerae anicteric ENT: MMM CV: Non-tachycardic Pulm: CTA b/l GI: Soft, NT/ND NEURO:  Alert & Oriented x 3   Gerrit Heck, DO [facility]   02/03/2022 2:38 PM

## 2022-02-03 NOTE — Op Note (Signed)
[place] Patient Name: Angela Pugh Procedure Date: 02/03/2022 2:28 PM MRN: [id number] Endoscopist: Gerrit Heck , MD Age: 76 Referring MD:  Date of Birth: [birth year] Gender: Female Account #: [id number] Procedure:                Upper GI endoscopy Indications:              Dysphagia, Heartburn, Suspected esophageal reflux Medicines:                Monitored Anesthesia Care Procedure:                Pre-Anesthesia Assessment:                           - Prior to the procedure, a History and Physical                            was performed, and patient medications and                            allergies were reviewed. The patient's tolerance of                            previous anesthesia was also reviewed. The risks                            and benefits of the procedure and the sedation                            options and risks were discussed with the patient.                            All questions were answered, and informed consent                            was obtained. Prior Anticoagulants: The patient has                            taken no previous anticoagulant or antiplatelet                            agents. ASA Grade Assessment: II - A patient with                            mild systemic disease. After reviewing the risks                            and benefits, the patient was deemed in                            satisfactory condition to undergo the procedure.                           After obtaining informed consent, the endoscope was  passed under direct vision. Throughout the                            procedure, the patient's blood pressure, pulse, and                            oxygen saturations were monitored continuously. The                            GIF [id number] #[id number] was introduced through the                            mouth, and advanced to the second part of duodenum.                            The  upper GI endoscopy was accomplished without                            difficulty. The patient tolerated the procedure                            well. Scope In: Scope Out: Findings:                 The upper third of the esophagus and middle third                            of the esophagus were normal.                           A single medium-sized bleb was found in the lower                            third of the esophagus, 37 cm from the incisors.                           A non-obstructing Schatzki ring was found in the                            lower third of the esophagus. This was located 1 cm                            distal to the esophagal bleb, and therefore elected                            for mechanical fracturing of the ring instead of                            balloon or bougie dilation. This was biopsied with                            a cold forceps for fracturing of the ring (no path  submitted). Estimated blood loss was minimal.                           Patchy mild inflammation characterized by erythema                            was found in the gastric body and in the gastric                            antrum. Biopsies were taken with a cold forceps for                            Helicobacter pylori testing. Estimated blood loss                            was minimal.                           A single small angioectasia with active bleeding                            was found in the second portion of the duodenum.                            Applied 1 hemostatic clip, but continued bleeding.                            Applied the tip of the snare for cautery, but the                            lesion continued to bleed. This was then treated                            with a 2nd hemostatic clips (MR conditional) with                            cessation of bleeding. There was no bleeding at the                            end of the  procedure.                           Normal mucosa was found in the remainderr of the                            duodenal bulb, in the first portion of the                            duodenum, and in the second portion of the duodenum. Complications:            No immediate complications. Estimated Blood Loss:     Estimated blood loss was minimal. Impression:               -  Normal upper third of esophagus and middle third                            of esophagus.                           - Bleb found in the esophagus.                           - Non-obstructing Schatzki ring. Given location in                            close proximity to the esophageal bleb, I elected                            to treat with cold forceps for ring fracturing.                           - Mild, non-ulcer gastritis. Biopsied.                           - A single bleeding angioectasia in the duodenum.                            Clips (MR conditional) were placed and cauterized                            with the tip of the snare.                           - Normal mucosa was found in the duodenal bulb, in                            the first portion of the duodenum and in the second                            portion of the duodenum. Recommendation:           - Patient has a contact number available for                            emergencies. The signs and symptoms of potential                            delayed complications were discussed with the                            patient. Return to normal activities tomorrow.                            Written discharge instructions were provided to the                            patient.                           -  Resume previous diet.                           - Continue present medications.                           - Await pathology results. Gerrit Heck, MD 02/03/2022 3:22:12 PM

## 2022-02-03 NOTE — Progress Notes (Signed)
Patient has a duodenal AVM bleeding.  Two clips were placed.  No biposies noted.  Cautery used as well. By Dr. Bleeding under control.

## 2022-02-03 NOTE — Patient Instructions (Signed)
YOU HAD AN ENDOSCOPIC PROCEDURE TODAY AT THE [facility]:   Refer to the procedure report that was given to you for any specific questions about what was found during the examination.  If the procedure report does not answer your questions, please call your gastroenterologist to clarify.  If you requested that your care partner not be given the details of your procedure findings, then the procedure report has been included in a sealed envelope for you to review at your convenience later.  YOU SHOULD EXPECT: Some feelings of bloating in the abdomen. Passage of more gas than usual.  Walking can help get rid of the air that was put into your GI tract during the procedure and reduce the bloating. If you had a lower endoscopy (such as a colonoscopy or flexible sigmoidoscopy) you may notice spotting of blood in your stool or on the toilet paper. If you underwent a bowel prep for your procedure, you may not have a normal bowel movement for a few days.  Please Note:  You might notice some irritation and congestion in your nose or some drainage.  This is from the oxygen used during your procedure.  There is no need for concern and it should clear up in a day or so.  SYMPTOMS TO REPORT IMMEDIATELY:   Following upper endoscopy (EGD)  Vomiting of blood or coffee ground material  New chest pain or pain under the shoulder blades  Painful or persistently difficult swallowing  New shortness of breath  Fever of 100F or higher  Black, tarry-looking stools  For urgent or emergent issues, a gastroenterologist can be reached at any hour by calling [phone]. Do not use MyChart messaging for urgent concerns.    DIET:  We do recommend a small meal at first, but then you may proceed to your regular diet.  Drink plenty of fluids but you should avoid alcoholic beverages for 24 hours.  ACTIVITY:  You should plan to take it easy for the rest of today and you should NOT DRIVE or use heavy machinery  until tomorrow (because of the sedation medicines used during the test).    FOLLOW UP: Our staff will call the number listed on your records the next business day following your procedure.  We will call around 7:15- 8:00 am to check on you and address any questions or concerns that you may have regarding the information given to you following your procedure. If we do not reach you, we will leave a message.     If any biopsies were taken you will be contacted by phone or by letter within the next 1-3 weeks.  Please call us at [phone] if you have not heard about the biopsies in 3 weeks.    SIGNATURES/CONFIDENTIALITY: You and/or your care partner have signed paperwork which will be entered into your electronic medical record.  These signatures attest to the fact that that the information above on your After Visit Summary has been reviewed and is understood.  Full responsibility of the confidentiality of this discharge information lies with you and/or your care-partner. 

## 2022-02-03 NOTE — Progress Notes (Signed)
Called to room to assist during endoscopic procedure.  Patient ID and intended procedure confirmed with present staff. Received instructions for my participation in the procedure from the performing physician.  

## 2022-02-03 NOTE — Progress Notes (Signed)
VS completed by DT.  Pt's states no medical or surgical changes since previsit or office visit.  

## 2022-02-03 NOTE — Progress Notes (Signed)
Pt in recovery with monitors in place, VSS. Report given to receiving RN. Bite guard was placed with pt awake to ensure comfort. No dental or soft tissue damage noted. 

## 2022-02-04 ENCOUNTER — Telehealth: Payer: Self-pay

## 2022-02-04 NOTE — Telephone Encounter (Signed)
  Follow up Call-     02/03/2022    2:05 PM 04/26/2020   12:54 PM  Call back number  Post procedure Call Back phone  # [phone]  Permission to leave phone message Yes Yes     Patient questions:  Do you have a fever, pain , or abdominal swelling? No. Pain Score  0 *  Have you tolerated food without any problems? Yes.    Have you been able to return to your normal activities? Yes.    Do you have any questions about your discharge instructions: Diet   No. Medications  No. Follow up visit  No.  Do you have questions or concerns about your Care? No.  Actions: * If pain score is 4 or above: No action needed, pain <4.

## 2022-02-10 ENCOUNTER — Other Ambulatory Visit: Payer: Self-pay | Admitting: Family Medicine

## 2022-02-10 ENCOUNTER — Ambulatory Visit: Payer: Medicare Other

## 2022-02-10 ENCOUNTER — Ambulatory Visit (INDEPENDENT_AMBULATORY_CARE_PROVIDER_SITE_OTHER): Payer: Medicare Other

## 2022-02-10 VITALS — BP 122/70 | HR 62 | Temp 98.4°F | Ht 67.0 in | Wt 180.3 lb

## 2022-02-10 DIAGNOSIS — Z23 Encounter for immunization: Secondary | ICD-10-CM

## 2022-02-10 DIAGNOSIS — Z0000 Encounter for general adult medical examination without abnormal findings: Secondary | ICD-10-CM

## 2022-02-10 DIAGNOSIS — E1169 Type 2 diabetes mellitus with other specified complication: Secondary | ICD-10-CM

## 2022-02-10 MED ORDER — BLOOD GLUCOSE MONITOR KIT: PACK | 0 refills | Status: DC

## 2022-02-10 NOTE — Progress Notes (Signed)
Patient left without being seen. She was not examined.

## 2022-02-10 NOTE — Patient Instructions (Signed)
Angela Pugh , Thank you for taking time to come for your Medicare Wellness Visit. I appreciate your ongoing commitment to your health goals. Please review the following plan we discussed and let me know if I can assist you in the future.   Screening recommendations/referrals: Colonoscopy: completed 04/26/2020 Mammogram: completed 04/15/2021, due 04/16/2022 Bone Density: completed 08/17/2019 Recommended yearly ophthalmology/optometry visit for glaucoma screening and checkup Recommended yearly dental visit for hygiene and checkup  Vaccinations: Influenza vaccine: today Pneumococcal vaccine: due Tdap vaccine: due Shingles vaccine: due  Covid-19: 02/27/2020, 07/15/2019, 06/24/2019  Advanced directives: Please bring a copy of your POA (Power of Attorney) and/or Living Will to your next appointment.   Conditions/risks identified: none  Next appointment: Follow up in one year for your annual wellness visit    Preventive Care 65 Years and Older, Female Preventive care refers to lifestyle choices and visits with your health care provider that can promote health and wellness. What does preventive care include? A yearly physical exam. This is also called an annual well check. Dental exams once or twice a year. Routine eye exams. Ask your health care provider how often you should have your eyes checked. Personal lifestyle choices, including: Daily care of your teeth and gums. Regular physical activity. Eating a healthy diet. Avoiding tobacco and drug use. Limiting alcohol use. Practicing safe sex. Taking low-dose aspirin every day. Taking vitamin and mineral supplements as recommended by your health care provider. What happens during an annual well check? The services and screenings done by your health care provider during your annual well check will depend on your age, overall health, lifestyle risk factors, and family history of disease. Counseling  Your health care provider may ask you  questions about your: Alcohol use. Tobacco use. Drug use. Emotional well-being. Home and relationship well-being. Sexual activity. Eating habits. History of falls. Memory and ability to understand (cognition). Work and work Statistician. Reproductive health. Screening  You may have the following tests or measurements: Height, weight, and BMI. Blood pressure. Lipid and cholesterol levels. These may be checked every 5 years, or more frequently if you are over 38 years old. Skin check. Lung cancer screening. You may have this screening every year starting at age 29 if you have a 30-pack-year history of smoking and currently smoke or have quit within the past 15 years. Fecal occult blood test (FOBT) of the stool. You may have this test every year starting at age 43. Flexible sigmoidoscopy or colonoscopy. You may have a sigmoidoscopy every 5 years or a colonoscopy every 10 years starting at age 41. Hepatitis C blood test. Hepatitis B blood test. Sexually transmitted disease (STD) testing. Diabetes screening. This is done by checking your blood sugar (glucose) after you have not eaten for a while (fasting). You may have this done every 1-3 years. Bone density scan. This is done to screen for osteoporosis. You may have this done starting at age 65. Mammogram. This may be done every 1-2 years. Talk to your health care provider about how often you should have regular mammograms. Talk with your health care provider about your test results, treatment options, and if necessary, the need for more tests. Vaccines  Your health care provider may recommend certain vaccines, such as: Influenza vaccine. This is recommended every year. Tetanus, diphtheria, and acellular pertussis (Tdap, Td) vaccine. You may need a Td booster every 10 years. Zoster vaccine. You may need this after age 20. Pneumococcal 13-valent conjugate (PCV13) vaccine. One dose is recommended after age  65. Pneumococcal polysaccharide  (PPSV23) vaccine. One dose is recommended after age 34. Talk to your health care provider about which screenings and vaccines you need and how often you need them. This information is not intended to replace advice given to you by your health care provider. Make sure you discuss any questions you have with your health care provider. Document Released: 05/31/2015 Document Revised: 01/22/2016 Document Reviewed: 03/05/2015 Elsevier Interactive Patient Education  2017 [place] Prevention in the Home Falls can cause injuries. They can happen to people of all ages. There are many things you can do to make your home safe and to help prevent falls. What can I do on the outside of my home? Regularly fix the edges of walkways and driveways and fix any cracks. Remove anything that might make you trip as you walk through a door, such as a raised step or threshold. Trim any bushes or trees on the path to your home. Use bright outdoor lighting. Clear any walking paths of anything that might make someone trip, such as rocks or tools. Regularly check to see if handrails are loose or broken. Make sure that both sides of any steps have handrails. Any raised decks and porches should have guardrails on the edges. Have any leaves, snow, or ice cleared regularly. Use sand or salt on walking paths during winter. Clean up any spills in your garage right away. This includes oil or grease spills. What can I do in the bathroom? Use night lights. Install grab bars by the toilet and in the tub and shower. Do not use towel bars as grab bars. Use non-skid mats or decals in the tub or shower. If you need to sit down in the shower, use a plastic, non-slip stool. Keep the floor dry. Clean up any water that spills on the floor as soon as it happens. Remove soap buildup in the tub or shower regularly. Attach bath mats securely with double-sided non-slip rug tape. Do not have throw rugs and other things on the  floor that can make you trip. What can I do in the bedroom? Use night lights. Make sure that you have a light by your bed that is easy to reach. Do not use any sheets or blankets that are too big for your bed. They should not hang down onto the floor. Have a firm chair that has side arms. You can use this for support while you get dressed. Do not have throw rugs and other things on the floor that can make you trip. What can I do in the kitchen? Clean up any spills right away. Avoid walking on wet floors. Keep items that you use a lot in easy-to-reach places. If you need to reach something above you, use a strong step stool that has a grab bar. Keep electrical cords out of the way. Do not use floor polish or wax that makes floors slippery. If you must use wax, use non-skid floor wax. Do not have throw rugs and other things on the floor that can make you trip. What can I do with my stairs? Do not leave any items on the stairs. Make sure that there are handrails on both sides of the stairs and use them. Fix handrails that are broken or loose. Make sure that handrails are as long as the stairways. Check any carpeting to make sure that it is firmly attached to the stairs. Fix any carpet that is loose or worn. Avoid having throw rugs at  the top or bottom of the stairs. If you do have throw rugs, attach them to the floor with carpet tape. Make sure that you have a light switch at the top of the stairs and the bottom of the stairs. If you do not have them, ask someone to add them for you. What else can I do to help prevent falls? Wear shoes that: Do not have high heels. Have rubber bottoms. Are comfortable and fit you well. Are closed at the toe. Do not wear sandals. If you use a stepladder: Make sure that it is fully opened. Do not climb a closed stepladder. Make sure that both sides of the stepladder are locked into place. Ask someone to hold it for you, if possible. Clearly mark and make  sure that you can see: Any grab bars or handrails. First and last steps. Where the edge of each step is. Use tools that help you move around (mobility aids) if they are needed. These include: Canes. Walkers. Scooters. Crutches. Turn on the lights when you go into a dark area. Replace any light bulbs as soon as they burn out. Set up your furniture so you have a clear path. Avoid moving your furniture around. If any of your floors are uneven, fix them. If there are any pets around you, be aware of where they are. Review your medicines with your doctor. Some medicines can make you feel dizzy. This can increase your chance of falling. Ask your doctor what other things that you can do to help prevent falls. This information is not intended to replace advice given to you by your health care provider. Make sure you discuss any questions you have with your health care provider. Document Released: 02/28/2009 Document Revised: 10/10/2015 Document Reviewed: 06/08/2014 Elsevier Interactive Patient Education  2017 [employer].

## 2022-02-10 NOTE — Progress Notes (Signed)
Subjective:   Angela Pugh is a 76 y.o. female who presents for Medicare Annual (Subsequent) preventive examination.  Review of Systems     Cardiac Risk Factors include: advanced age (>9mn, >>3women);diabetes mellitus;dyslipidemia;hypertension     Objective:    Today's Vitals   02/10/22 0808  BP: 122/70  Pulse: 62  Temp: 98.4 F (36.9 C)  TempSrc: Oral  SpO2: 96%  Weight: 180 lb 4.8 oz (81.8 kg)  Height: '5\' 7"'$  (1.702 m)   Body mass index is 28.24 kg/m.     02/10/2022    8:20 AM 12/01/2021    8:41 PM 11/26/2021   10:04 AM 06/19/2021    9:34 AM 05/11/2021   10:55 AM 01/28/2021   11:15 AM 02/21/2020    8:28 AM  Advanced Directives  Does Patient Have a Medical Advance Directive? Yes No Yes Yes No Yes Yes  Type of [place]amed[place]of [place] will   HDiscovery [place] will  Healthcare Power of [place]. [place] will  Does patient want to make changes to medical advance directive?       No - Patient declined  Copy of [place] Chart? No - copy requested     No - copy requested No - copy requested    Current Medications (verified) Outpatient Encounter Medications as of 02/10/2022  Medication Sig   amLODipine (NORVASC) 5 MG tablet TAKE 1 TABLET(5 MG) BY MOUTH DAILY   aspirin 81 MG EC tablet Take 81 mg by mouth daily. Swallow whole.   Calcium Carb-Cholecalciferol (CALCIUM 1000 + D PO)    celecoxib (CELEBREX) 200 MG capsule Take 1 capsule (200 mg total) by mouth daily.   clobetasol ointment (TEMOVATE) 06.96% Apply 1 Application topically 2 (two) times daily.   famotidine (PEPCID) 20 MG tablet Take 1 tablet (20 mg total) by mouth 2 (two) times daily.   fexofenadine (ALLEGRA ALLERGY) 180 MG tablet Take 1 tablet (180 mg total) by mouth daily.   hyoscyamine (LEVSIN SL) 0.125 MG SL tablet Take 1 to 2 tablets under the tongue as needed for cramping and abdominal bloating.   mesalamine  (LIALDA) 1.2 g EC tablet TAKE 4 TABLETS(4.8 GRAMS) BY MOUTH DAILY WITH BREAKFAST   Multiple Vitamins-Minerals (CENTRUM ADULTS PO) Take 1 tablet by mouth daily.    olmesartan (BENICAR) 5 MG tablet TAKE 2 TABLETS(10 MG) BY MOUTH DAILY   Omega-3 Fatty Acids (FISH OIL PO) 2,000 mg daily.   pantoprazole (PROTONIX) 40 MG tablet Take 1 tablet (40 mg total) by mouth 2 (two) times daily before a meal.   potassium chloride SA (KLOR-CON M) 20 MEQ tablet TAKE 1 TABLET(20 MEQ TOTAL) BY MOUTH DAILY   rosuvastatin (CRESTOR) 20 MG tablet Take 1 tablet (20 mg total) by mouth daily.   triamcinolone cream (KENALOG) 0.5 % APPLY TOPICALLY TO THE AFFECTED AREA TWICE DAILY AS NEEDED FOR DRY SKIN   vitamin B-12 (CYANOCOBALAMIN) 1000 MCG tablet Take 1,000 mcg by mouth daily.   diazepam (VALIUM) 5 MG tablet Take one tablet after signing papers at MRI and may repeat after 15-20 mins if necessary for anxiety. (Patient not taking: Reported on 02/10/2022)   Facility-Administered Encounter Medications as of 02/10/2022  Medication   0.9 %  sodium chloride infusion    Allergies (verified) Symbicort [budesonide-formoterol fumarate], Acetaminophen, Azithromycin, Lipitor [atorvastatin], and Zyrtec [cetirizine]   History: Past Medical History:  Diagnosis Date   Arthritis    back, knees, shoulder,  hands , injections in pelvis, for bone spurs (05/04/2016)   Chronic back pain    Diverticulosis    History of kidney stones    HLD (hyperlipidemia)    Hypertension    Migraine    hx (05/04/2016)   Peripheral neuropathy    Radiculopathy    Sinus complaint    Sinus headache    occasional (05/04/2016)   Type II diabetes mellitus ([place])    diet controlled, no meds now, was on insulin & po treatment at one time     Vertigo    Past Surgical History:  Procedure Laterality Date   ANTERIOR CERVICAL DECOMP/DISCECTOMY FUSION N/A 04/17/2016   Procedure: ANTERIOR CERVICAL DECOMPRESSION/DISCECTOMY FUSION CERVICAL THREE - CERVICAL FOUR,  POSTERIOR CERVICAL DISCECTOMY CERVICAL SEVEN -THORASIC ONE LEFT;  Surgeon: Ashok Pall, MD;  Location: [facility];  Service: Neurosurgery;  Laterality: N/A;  Anterior/Posterior   ANTERIOR FUSION CERVICAL SPINE  2000   Dr. Carloyn Manner; "for bone spurs; put hardware in too"   BACK SURGERY     COLONOSCOPY  06/10/2016   [facility]   LEFT HEART CATH AND CORONARY ANGIOGRAPHY N/A 03/17/2018   Procedure: LEFT HEART CATH AND CORONARY ANGIOGRAPHY;  Surgeon: Lorretta Harp, MD;  Location: [facility];  Service: Cardiovascular;  Laterality: N/A;   LUMBAR LAMINECTOMY Left 06/30/2013   Procedure: Left L3-4 Extraforaminal approach to excise far lateral herniated nucleus pulposus;  Surgeon: Jessy Oto, MD;  Location: [facility];  Service: Orthopedics;  Laterality: Left;   TUBAL LIGATION     VAGINAL HYSTERECTOMY     Family History  Problem Relation Age of Onset   Uterine cancer Mother    Diabetes type II Sister    Diabetes Mellitus II Brother    Colon cancer Neg Hx    Pancreatic cancer Neg Hx    Esophageal cancer Neg Hx    Colon polyps Neg Hx    Rectal cancer Neg Hx    Stomach cancer Neg Hx    Social History   Socioeconomic History   Marital status: Widowed    Spouse name: Not on file   Number of children: Not on file   Years of education: Not on file   Highest education level: Not on file  Occupational History   Not on file  Tobacco Use   Smoking status: Some Days    Packs/day: 0.50    Years: 20.00    Total pack years: 10.00    Types: Cigarettes    Last attempt to quit: 06/30/1982    Years since quitting: 39.6   Smokeless tobacco: Never   Tobacco comments:    Patient only smokes in social setting when drinking. Admits former h/o heavy smoking years ago smoked 1.5 packs/day.    12/04/2021 Patient states she has not spoke for about 3 weeks  Vaping Use   Vaping Use: Never used  Substance and Sexual Activity   Alcohol use: Yes    Comment: 05/04/2016 "might have a drink a couple days/year; might  not"   Drug use: No   Sexual activity: Not Currently  Other Topics Concern   Not on file  Social History Narrative   Not on file   Social Determinants of Health   Financial Resource Strain: Low Risk  (02/10/2022)   Overall Financial Resource Strain (CARDIA)    Difficulty of Paying Living Expenses: Not hard at all  Food Insecurity: No Food Insecurity (02/10/2022)   Hunger Vital Sign    Worried About Running Out  of Food in the Last Year: Never true    [place] in the Last Year: Never true  Transportation Needs: No Transportation Needs (02/10/2022)   PRAPARE - Hydrologist (Medical): No    Lack of Transportation (Non-Medical): No  Physical Activity: Sufficiently Active (02/10/2022)   Exercise Vital Sign    Days of Exercise per Week: 7 days    Minutes of Exercise per Session: 30 min  Stress: No Stress Concern Present (02/10/2022)   [place]    Feeling of Stress : Not at all  Social Connections: Moderately Isolated (01/28/2021)   Social Connection and Isolation Panel [NHANES]    Frequency of Communication with Friends and Family: More than three times a week    Frequency of Social Gatherings with Friends and Family: More than three times a week    Attends Religious Services: More than 4 times per year    Active Member of [employer] or Organizations: No    Attends Archivist Meetings: Never    Marital Status: Widowed    Tobacco Counseling Ready to quit: Not Answered Counseling given: Not Answered Tobacco comments: Patient only smokes in social setting when drinking. Admits former h/o heavy smoking years ago smoked 1.5 packs/day. 12/04/2021 Patient states she has not spoke for about 3 weeks   Clinical Intake:  Pre-visit preparation completed: Yes  Pain : No/denies pain     Nutritional Status: BMI 25 -29 Overweight Nutritional Risks: Nausea/ vomitting/ diarrhea (diarrhea  the other day) Diabetes: Yes  How often do you need to have someone help you when you read instructions, pamphlets, or other written materials from your doctor or pharmacy?: 1 - Never What is the last grade level you completed in school?: associates degree  Diabetic? Yes Nutrition Risk Assessment:  Has the patient had any N/V/D within the last 2 months?  Yes  Does the patient have any non-healing wounds?  No  Has the patient had any unintentional weight loss or weight gain?  No   Diabetes:  Is the patient diabetic?  Yes  If diabetic, was a CBG obtained today?  No  Did the patient bring in their glucometer from home?  No  How often do you monitor your CBG's? 1-2 weekly.   Financial Strains and Diabetes Management:  Are you having any financial strains with the device, your supplies or your medication? No .  Does the patient want to be seen by Chronic Care Management for management of their diabetes?  No  Would the patient like to be referred to a Nutritionist or for Diabetic Management?  No   Diabetic Exams:  Diabetic Eye Exam: Overdue for diabetic eye exam. Pt has been advised about the importance in completing this exam. Patient advised to call and schedule an eye exam. Diabetic Foot Exam: Completed 05/21/2021   Interpreter Needed?: No  Information entered by :: NAllen LPN   Activities of Daily Living    02/10/2022    8:21 AM  In your present state of health, do you have any difficulty performing the following activities:  Hearing? 0  Vision? 1  Comment gets blurry sometimes  Difficulty concentrating or making decisions? 0  Walking or climbing stairs? 0  Dressing or bathing? 0  Doing errands, shopping? 0  Preparing Food and eating ? N  Using the Toilet? N  In the past six months, have you accidently leaked urine? N  Do  you have problems with loss of bowel control? Y  Managing your Medications? N  Managing your Finances? N  Housekeeping or managing your  Housekeeping? N    Patient Care Team: [name], MD as PCP - General (Family Medicine) Bensimhon, Shaune Pascal, MD as PCP - Advanced Heart Failure (Cardiology) Lorretta Harp, MD as PCP - Cardiology (Cardiology) Viona Gilmore, [facility] as Pharmacist (Pharmacist)  Indicate any recent Medical Services you may have received from other than [facility] providers in the past year (date may be approximate).     Assessment:   This is a routine wellness examination for Damiah.  Hearing/Vision screen Vision Screening - Comments:: Regular eye exams, [facility]  Dietary issues and exercise activities discussed: Current Exercise Habits: The patient does not participate in regular exercise at present (works out in the yard)   Goals Addressed             This Visit's Progress    Patient Stated       02/10/2022, wants to get rid of diverticulitis       Depression Screen    02/10/2022    8:21 AM 12/26/2021    8:15 AM 12/11/2021    8:08 AM 11/10/2021    4:36 PM 01/28/2021   11:14 AM 02/21/2020    8:30 AM 02/16/2019   12:51 PM  PHQ 2/9 Scores  PHQ - 2 Score 0 0 2 3 0 0 0  PHQ- 9 Score  '7 9 11  '$ 0     Fall Risk    02/10/2022    8:21 AM 12/26/2021    8:14 AM 12/11/2021    8:09 AM 11/10/2021    4:37 PM 01/28/2021   11:16 AM  Fall Risk   Falls in the past year? 0 0 0 0 0  Number falls in past yr: 0 0 0 0 0  Injury with Fall? 0 0 0 0 0  Risk for fall due to : Medication side effect No Fall Risks No Fall Risks No Fall Risks Impaired vision;Impaired balance/gait  Risk for fall due to: Comment     with sinus issues  Follow up Falls prevention discussed;Education provided;Falls evaluation completed Falls evaluation completed Falls evaluation completed Falls evaluation completed Falls prevention discussed    FALL RISK PREVENTION PERTAINING TO THE HOME:  Any stairs in or around the home? No  If so, are there any without handrails? N/a Home free of loose throw rugs in walkways, pet beds,  electrical cords, etc? Yes  Adequate lighting in your home to reduce risk of falls? Yes   ASSISTIVE DEVICES UTILIZED TO PREVENT FALLS:  Life alert? No  Use of a cane, walker or w/c? No  Grab bars in the bathroom? No  Shower chair or bench in shower? Yes  Elevated toilet seat or a handicapped toilet? No   TIMED UP AND GO:  Was the test performed? Yes .  Length of time to ambulate 10 feet: 5 sec.   Gait steady and fast without use of assistive device  Cognitive Function:        02/10/2022    8:23 AM 01/28/2021   11:20 AM 02/21/2020    8:33 AM  6CIT Screen  What Year? 0 points 0 points 0 points  What month? 0 points 0 points 0 points  What time? 0 points 0 points 0 points  Count back from 20 0 points 0 points 0 points  Months in reverse 0 points 0 points  0 points  Repeat phrase 0 points 2 points 0 points  Total Score 0 points 2 points 0 points    Immunizations Immunization History  Administered Date(s) Administered   Fluad Quad(high Dose 65+) 02/10/2022   PFIZER(Purple Top)SARS-COV-2 Vaccination 06/24/2019, 07/15/2019, 02/27/2020    TDAP status: Due, Education has been provided regarding the importance of this vaccine. Advised may receive this vaccine at local pharmacy or Health Dept. Aware to provide a copy of the vaccination record if obtained from local pharmacy or Health Dept. Verbalized acceptance and understanding.  Flu Vaccine status: Completed at today's visit  Pneumococcal vaccine status: Due, Education has been provided regarding the importance of this vaccine. Advised may receive this vaccine at local pharmacy or Health Dept. Aware to provide a copy of the vaccination record if obtained from local pharmacy or Health Dept. Verbalized acceptance and understanding.  Covid-19 vaccine status: Completed vaccines  Qualifies for Shingles Vaccine? Yes   Zostavax completed No   Shingrix Completed?: No.    Education has been provided regarding the importance of this  vaccine. Patient has been advised to call insurance company to determine out of pocket expense if they have not yet received this vaccine. Advised may also receive vaccine at local pharmacy or Health Dept. Verbalized acceptance and understanding.  Screening Tests Health Maintenance  Topic Date Due   Pneumonia Vaccine 53+ Years old (1 - PCV) Never done   Diabetic kidney evaluation - Urine ACR  Never done   Hepatitis C Screening  Never done   TETANUS/TDAP  Never done   Zoster Vaccines- Shingrix (1 of 2) Never done   COVID-19 Vaccine (4 - Pfizer series) 04/23/2020   OPHTHALMOLOGY EXAM  08/13/2021   HEMOGLOBIN A1C  05/12/2022   FOOT EXAM  05/21/2022   Diabetic kidney evaluation - GFR measurement  12/02/2022   INFLUENZA VACCINE  Completed   DEXA SCAN  Completed   HPV VACCINES  Aged Out   COLONOSCOPY (Pts 45-8yr Insurance coverage will need to be confirmed)  Discontinued    Health Maintenance  Health Maintenance Due  Topic Date Due   Pneumonia Vaccine 75 Years old (1 - PCV) Never done   Diabetic kidney evaluation - Urine ACR  Never done   Hepatitis C Screening  Never done   TETANUS/TDAP  Never done   Zoster Vaccines- Shingrix (1 of 2) Never done   COVID-19 Vaccine (4 - Pfizer series) 04/23/2020   OPHTHALMOLOGY EXAM  08/13/2021    Colorectal cancer screening: Type of screening: Colonoscopy. Completed 04/26/2020. Repeat every 5 years  Mammogram status: Completed 04/15/2021. Repeat every year  Bone Density status: Completed 08/17/2019.  Lung Cancer Screening: (Low Dose CT Chest recommended if Age 75-80years, 30 pack-year currently smoking OR have quit w/in 15years.) does not qualify.   Lung Cancer Screening Referral: no  Additional Screening:  Hepatitis C Screening: does qualify;   Vision Screening: Recommended annual ophthalmology exams for early detection of glaucoma and other disorders of the eye. Is the patient up to date with their annual eye exam?  No  Who is the  provider or what is the name of the [facility] in which the patient attends annual [facility] exams[facility] provider, would they like to be referred to a provider to establish care? No .   Dental Screening: Recommended annual dental exams for proper oral hygiene  Community Resource Referral / Chronic Care Management: CRR required this visit?  No  CCM required this visit?  No      Plan:     I have personally reviewed and noted the following in the patient's chart:   Medical and social history Use of alcohol, tobacco or illicit drugs  Current medications and supplements including opioid prescriptions. Patient is not currently taking opioid prescriptions. Functional ability and status Nutritional status Physical activity Advanced directives List of other physicians Hospitalizations, surgeries, and ER visits in previous 12 months Vitals Screenings to include cognitive, depression, and falls Referrals and appointments  In addition, I have reviewed and discussed with patient certain preventive protocols, quality metrics, and best practice recommendations. A written personalized care plan for preventive services as well as general preventive health recommendations were provided to patient.     Kellie Simmering, LPN   2/48/2500   Nurse Notes: none

## 2022-02-17 ENCOUNTER — Ambulatory Visit: Payer: Medicare Other | Admitting: Gastroenterology

## 2022-02-19 ENCOUNTER — Ambulatory Visit (INDEPENDENT_AMBULATORY_CARE_PROVIDER_SITE_OTHER): Payer: Medicare Other | Admitting: Specialist

## 2022-02-19 ENCOUNTER — Encounter: Payer: Self-pay | Admitting: Specialist

## 2022-02-19 VITALS — BP 151/77 | HR 56 | Ht 69.0 in | Wt 180.0 lb

## 2022-02-19 DIAGNOSIS — M4156 Other secondary scoliosis, lumbar region: Secondary | ICD-10-CM | POA: Diagnosis not present

## 2022-02-19 DIAGNOSIS — M7062 Trochanteric bursitis, left hip: Secondary | ICD-10-CM | POA: Diagnosis not present

## 2022-02-19 DIAGNOSIS — M5416 Radiculopathy, lumbar region: Secondary | ICD-10-CM | POA: Diagnosis not present

## 2022-02-19 DIAGNOSIS — M4316 Spondylolisthesis, lumbar region: Secondary | ICD-10-CM

## 2022-02-19 DIAGNOSIS — M160 Bilateral primary osteoarthritis of hip: Secondary | ICD-10-CM | POA: Diagnosis not present

## 2022-02-19 DIAGNOSIS — M1712 Unilateral primary osteoarthritis, left knee: Secondary | ICD-10-CM | POA: Diagnosis not present

## 2022-02-19 DIAGNOSIS — M5136 Other intervertebral disc degeneration, lumbar region: Secondary | ICD-10-CM | POA: Diagnosis not present

## 2022-02-19 MED ORDER — CODEINE SULFATE 30 MG PO TABS: 30.0000 mg | ORAL_TABLET | Freq: Four times a day (QID) | ORAL | 0 refills | Status: DC | PRN

## 2022-02-19 MED ORDER — CELECOXIB 200 MG PO CAPS: 200.0000 mg | ORAL_CAPSULE | Freq: Every day | ORAL | 3 refills | Status: DC

## 2022-02-19 NOTE — Patient Instructions (Addendum)
Pain with lying on the left side. Numbness and tingling into the left lateral thigh and calf and into the left great toe. Tender over the left greater trochanter  with good relief.   Avoid bending, stooping and avoid lifting weights greater than 10 lbs. Avoid prolong standing and walking. Avoid frequent bending and stooping  No lifting greater than 10 lbs. May use ice or moist heat for pain. Weight loss is of benefit. Handicap license is approved. PT for the lumbar spine ordered Celebrex for arthritis pain Codiene if not relieved with celebrex, rarely.   Follow-Up Instructions: No follow-ups on file.

## 2022-02-19 NOTE — Progress Notes (Signed)
Office Visit Note   Patient: Angela Pugh           Date of Birth: June 28, 1945           MRN: [id number] Visit Date: 02/19/2022              Requested by: [name], MD [place],  [place] [address] PCP: [name], MD   Assessment & Plan: Visit Diagnoses: No diagnosis found.  Plan: Pain with lying on the left side. Numbness and tingling into the left lateral thigh and calf and into the left great toe. Tender over the left greater trochanter  with good relief.   Avoid bending, stooping and avoid lifting weights greater than 10 lbs. Avoid prolong standing and walking. Avoid frequent bending and stooping  No lifting greater than 10 lbs. May use ice or moist heat for pain. Weight loss is of benefit. Handicap license is approved. PT for the lumbar spine ordered   Follow-Up Instructions: No follow-ups on file.     Follow-Up Instructions: No follow-ups on file.   Orders:  No orders of the defined types were placed in this encounter.  No orders of the defined types were placed in this encounter.     Procedures: No procedures performed   Clinical Data: No additional findings.   Subjective: Chief Complaint  Patient presents with  . Left Hip - Follow-up  . Lower Back - Follow-up    76 year old female with history of back and left hip pain. She is taking celebrex and doing home exercises. She is able to grocery shop and to walk for exercise. Not taking much in the way of stronger meds. Tylenol #3.   Review of Systems  Constitutional: Negative.   HENT: Negative.    Eyes: Negative.   Respiratory: Negative.    Cardiovascular: Negative.   Gastrointestinal: Negative.   Endocrine: Negative.   Genitourinary: Negative.   Musculoskeletal: Negative.   Skin: Negative.   Allergic/Immunologic: Negative.   Neurological: Negative.   Hematological: Negative.   Psychiatric/Behavioral: Negative.      Objective: Vital Signs: BP (!)  151/77   Pulse (!) 56   Ht '5\' 9"'$  (1.753 m)   Wt 180 lb (81.6 kg)   BMI 26.58 kg/m   Physical Exam Constitutional:      Appearance: She is well-developed.  HENT:     Head: Normocephalic and atraumatic.  Eyes:     Pupils: Pupils are equal, round, and reactive to light.  Pulmonary:     Effort: Pulmonary effort is normal.     Breath sounds: Normal breath sounds.  Abdominal:     General: Bowel sounds are normal.     Palpations: Abdomen is soft.  Musculoskeletal:     Cervical back: Normal range of motion and neck supple.     Lumbar back: Negative right straight leg raise test and negative left straight leg raise test.  Skin:    General: Skin is warm and dry.  Neurological:     Mental Status: She is alert and oriented to person, place, and time.  Psychiatric:        Behavior: Behavior normal.        Thought Content: Thought content normal.        Judgment: Judgment normal.   Back Exam   Tenderness  The patient is experiencing tenderness in the lumbar.  Range of Motion  Extension:  abnormal  Flexion:  abnormal  Lateral bend right:  abnormal  Lateral bend left:  abnormal  Rotation right:  abnormal  Rotation left:  abnormal   Muscle Strength  Right Quadriceps:  5/5  Left Quadriceps:  5/5  Right Hamstrings:  5/5  Left Hamstrings:  5/5   Tests  Straight leg raise right: negative Straight leg raise left: negative  Other  Toe walk: normal Heel walk: normal Sensation: normal Gait: normal  Erythema: no back redness Scars: absent  Comments:  Hips with decreased symmetric ROM IR 15/15, ER is about 35 degrees bilaterally.     Specialty Comments:  No specialty comments available.  Imaging: No results found.   PMFS History: Patient Active Problem List   Diagnosis Date Noted  . Herniated lumbar intervertebral disc 06/30/2013    Priority: High    Class: Acute  . Headache above the eye region 02/19/2021  . Nasal congestion 02/19/2021  . Emphysema lung ([facility])  10/04/2020  . Abdominal pain, epigastric 03/18/2020  . Low back pain without sciatica 10/30/2019  . Body mass index (BMI) 26.0-26.9, adult 10/23/2019  . Nonischemic cardiomyopathy ([place]) 04/12/2018  . Chest pain 01/25/2018  . Left bundle branch block 01/25/2018  . Carotid artery disease ([facility]) 01/25/2018  . Seasonal allergies 10/18/2017  . Arthritis 10/18/2017  . Pre-diabetes 10/18/2017  . Protein-calorie malnutrition, severe 05/05/2016  . Dysphagia 05/04/2016  . HNP (herniated nucleus pulposus) with myelopathy, cervical 04/17/2016  . Abdominal pain 02/01/2016  . Acute diverticulitis 02/01/2016  . HLD (hyperlipidemia) 02/01/2016  . Diabetes mellitus without complication ([place]) 32/95/1884  . Hypokalemia 02/01/2016  . Hypertension   . Cervical spondylosis without myelopathy 01/13/2016  . Bilateral carpal tunnel syndrome 01/13/2016   Past Medical History:  Diagnosis Date  . Arthritis    back, knees, shoulder, hands , injections in pelvis, for bone spurs (05/04/2016)  . Chronic back pain   . Diverticulosis   . History of kidney stones   . HLD (hyperlipidemia)   . Hypertension   . Migraine    hx (05/04/2016)  . Peripheral neuropathy   . Radiculopathy   . Sinus complaint   . Sinus headache    occasional (05/04/2016)  . Type II diabetes mellitus (HCC)    diet controlled, no meds now, was on insulin & po treatment at one time    . Vertigo     Family History  Problem Relation Age of Onset  . Uterine cancer Mother   . Diabetes type II Sister   . Diabetes Mellitus II Brother   . Colon cancer Neg Hx   . Pancreatic cancer Neg Hx   . Esophageal cancer Neg Hx   . Colon polyps Neg Hx   . Rectal cancer Neg Hx   . Stomach cancer Neg Hx     Past Surgical History:  Procedure Laterality Date  . ANTERIOR CERVICAL DECOMP/DISCECTOMY FUSION N/A 04/17/2016   Procedure: ANTERIOR CERVICAL DECOMPRESSION/DISCECTOMY FUSION CERVICAL THREE - CERVICAL FOUR, POSTERIOR CERVICAL DISCECTOMY CERVICAL  SEVEN -THORASIC ONE LEFT;  Surgeon: Ashok Pall, MD;  Location: [facility];  Service: Neurosurgery;  Laterality: N/A;  Anterior/Posterior  . ANTERIOR FUSION CERVICAL SPINE  2000   Dr. Carloyn Manner; "for bone spurs; put hardware in too"  . BACK SURGERY    . COLONOSCOPY  06/10/2016   [facility]  . LEFT HEART CATH AND CORONARY ANGIOGRAPHY N/A 03/17/2018   Procedure: LEFT HEART CATH AND CORONARY ANGIOGRAPHY;  Surgeon: Lorretta Harp, MD;  Location: [facility];  Service: Cardiovascular;  Laterality: N/A;  . LUMBAR LAMINECTOMY Left  06/30/2013   Procedure: Left L3-4 Extraforaminal approach to excise far lateral herniated nucleus pulposus;  Surgeon: Jessy Oto, MD;  Location: [facility];  Service: Orthopedics;  Laterality: Left;  . TUBAL LIGATION    . VAGINAL HYSTERECTOMY     Social History   Occupational History  . Not on file  Tobacco Use  . Smoking status: Some Days    Packs/day: 0.50    Years: 20.00    Total pack years: 10.00    Types: Cigarettes    Last attempt to quit: 06/30/1982    Years since quitting: 39.6  . Smokeless tobacco: Never  . Tobacco comments:    Patient only smokes in social setting when drinking. Admits former h/o heavy smoking years ago smoked 1.5 packs/day.    12/04/2021 Patient states she has not spoke for about 3 weeks  Vaping Use  . Vaping Use: Never used  Substance and Sexual Activity  . Alcohol use: Yes    Comment: 05/04/2016 "might have a drink a couple days/year; might not"  . Drug use: No  . Sexual activity: Not Currently

## 2022-02-19 NOTE — Addendum Note (Signed)
Addended by: Basil Dess on: 02/19/2022 09:33 AM   Modules accepted: Orders

## 2022-02-23 ENCOUNTER — Other Ambulatory Visit: Payer: Self-pay

## 2022-02-23 MED ORDER — METRONIDAZOLE 250 MG PO TABS: 250.0000 mg | ORAL_TABLET | Freq: Four times a day (QID) | ORAL | 0 refills | Status: DC

## 2022-02-23 MED ORDER — DOXYCYCLINE HYCLATE 100 MG PO CAPS: 100.0000 mg | ORAL_CAPSULE | Freq: Two times a day (BID) | ORAL | 0 refills | Status: DC

## 2022-02-23 MED ORDER — BISMUTH SUBSALICYLATE 262 MG PO CHEW: 524.0000 mg | CHEWABLE_TABLET | Freq: Four times a day (QID) | ORAL | 0 refills | Status: DC

## 2022-02-25 DIAGNOSIS — H25812 Combined forms of age-related cataract, left eye: Secondary | ICD-10-CM | POA: Diagnosis not present

## 2022-02-25 DIAGNOSIS — H25813 Combined forms of age-related cataract, bilateral: Secondary | ICD-10-CM | POA: Diagnosis not present

## 2022-02-27 ENCOUNTER — Telehealth: Payer: Self-pay | Admitting: Gastroenterology

## 2022-02-27 NOTE — Telephone Encounter (Signed)
Inbound call from patient stating she never received instructions on how to take the rx she was prescribed, she was also inquiring about results from her procedure.

## 2022-02-27 NOTE — Telephone Encounter (Signed)
Pt wanted to know how she should take her medications that were prescribed for her H. Pylori. Let pt know that instructions should be on the prescription bottle and also went through Dr. Vivia Ewing surgical pathology note with pt again. Letter mailed to pt with recommendations since pt stated she does not use mychart. Pt verbalized understanding and had no other concerns at end of call.

## 2022-03-03 ENCOUNTER — Ambulatory Visit (INDEPENDENT_AMBULATORY_CARE_PROVIDER_SITE_OTHER): Payer: Medicare Other | Admitting: Podiatry

## 2022-03-03 ENCOUNTER — Encounter: Payer: Self-pay | Admitting: Podiatry

## 2022-03-03 DIAGNOSIS — E1151 Type 2 diabetes mellitus with diabetic peripheral angiopathy without gangrene: Secondary | ICD-10-CM | POA: Diagnosis not present

## 2022-03-03 DIAGNOSIS — Q828 Other specified congenital malformations of skin: Secondary | ICD-10-CM

## 2022-03-03 DIAGNOSIS — B351 Tinea unguium: Secondary | ICD-10-CM | POA: Diagnosis not present

## 2022-03-03 DIAGNOSIS — M79674 Pain in right toe(s): Secondary | ICD-10-CM

## 2022-03-03 DIAGNOSIS — M79675 Pain in left toe(s): Secondary | ICD-10-CM

## 2022-03-03 NOTE — Progress Notes (Signed)
  Subjective:  Patient ID: Angela Pugh, female    DOB: [birth year],  MRN: [id number]  Angela Pugh presents to clinic today for:  Chief Complaint  Patient presents with   Nail Problem    Diabetic foot care BS-Did not check today A1C-6.0 PCP-Shannon Banks PCP VST- 2 months ago   New problem(s): None.   PCP is [name], MD , and last visit was  January 08, 2022.  Allergies  Allergen Reactions   Symbicort [Budesonide-Formoterol Fumarate] Shortness Of Breath, Swelling and Other (See Comments)    Pharyngeal edema, hoarseness, tightness in chest, lips became dark.   Acetaminophen Hives and Nausea Only   Azithromycin Hives and Nausea And Vomiting   Lipitor [Atorvastatin] Nausea Only    weakness   Zyrtec [Cetirizine]     Hives     Review of Systems: Negative except as noted in the HPI.  Objective: No changes noted in today's physical examination.  Angela Pugh is a pleasant 76 y.o. female WD, WN in NAD. AAO x 3.  Vascular Examination: CFT <3 seconds b/l LE. Palpable DP pulse(s) right lower extremity Faintly palpable PT pulse(s) b/l LE. Diminished DP pulse(s) left lower extremity. Pedal hair sparse. No pain with calf compression b/l. Lower extremity skin temperature gradient within normal limits. No edema noted b/l LE. No ischemia or gangrene noted b/l LE. No cyanosis or clubbing noted b/l LE.  Dermatological Examination: Pedal integument with normal turgor, texture and tone b/l LE. No open wounds b/l. No interdigital macerations b/l. Toenails left great toe and 2-5 b/l elongated, thickened, discolored with subungual debris. +Tenderness with dorsal palpation of nailplates. No fluctuance. Porokeratotic lesion(s) noted plantar heel pad of right foot, posterolateral aspect of right heel, and submet head 2 b/l.  Musculoskeletal Examination: Normal muscle strength 5/5 to all lower extremity muscle groups bilaterally. Hammertoe deformity noted 2-5 b/l.Marland Kitchen No pain,  crepitus or joint limitation noted with ROM b/l LE.  Patient ambulates independently without assistive aids.  Neurological Examination: Pt has subjective symptoms of neuropathy. Protective sensation intact 5/5 intact bilaterally with 10g monofilament b/l. Vibratory sensation intact b/l. Clonus negative b/l.  Assessment/Plan: 1. Pain due to onychomycosis of toenails of both feet   2. Porokeratosis   3. Type II diabetes mellitus with peripheral circulatory disorder (HCC)     No orders of the defined types were placed in this encounter.   -Consent given for treatment as described below: -Examined patient. -Continue foot and shoe inspections daily. Monitor blood glucose per PCP/Endocrinologist's recommendations. -Toenails 2-5 left foot, 2-5 right foot, and L hallux debrided in length and girth without iatrogenic bleeding with sterile nail nipper and dremel.  -Porokeratotic lesion(s) plantar heel pad of right foot, posterolateral aspect of right heel, and submet head 2 b/l pared and enucleated with sterile currette without incident. Total number of lesions debrided=4. -Patient/POA to call should there be question/concern in the interim.   Return in about 3 months (around 06/03/2022).  Marzetta Board, DPM

## 2022-03-19 ENCOUNTER — Other Ambulatory Visit: Payer: Self-pay | Admitting: Family Medicine

## 2022-03-19 DIAGNOSIS — Z1231 Encounter for screening mammogram for malignant neoplasm of breast: Secondary | ICD-10-CM

## 2022-03-24 ENCOUNTER — Other Ambulatory Visit: Payer: Self-pay | Admitting: Family Medicine

## 2022-03-24 DIAGNOSIS — K219 Gastro-esophageal reflux disease without esophagitis: Secondary | ICD-10-CM

## 2022-04-01 ENCOUNTER — Telehealth: Payer: Self-pay | Admitting: Pharmacist

## 2022-04-01 NOTE — Chronic Care Management (AMB) (Signed)
Chronic Care Management Pharmacy Assistant   Name: Angela Pugh  MRN: [id number] DOB: [birth year]  Reason for Encounter: Disease State / Hypertension Assessment Call   Conditions to be addressed/monitored: HTN  Recent office visits:  02/10/2022 Glenna Durand LPN - Medicare annual wellness exam   01/08/2022 Grier Mitts MD - Patient was seen for psoriasis and an additional concern. Referral to Dermatology.  Started Clobetasol 0.05% ointment. Follow up if symptoms worsen or fail to improve.   Recent consult visits:  03/03/2022 Acquanetta Sit DPM - Patient was seen for pain due to onychomycosis of toenails of both feet and additional concerns. No medication changes. Follow up in 3 months.   02/19/2022 Basil Dess MD (orthopedic) - Patient was seen for Spondylolisthesis, lumbar region and additional concerns. Started Codeine 30 mg q 6 hrs prn. Follow up in 6 months.   02/03/2022 [place] - Patient was seen for chronic GERD and additional concerns. Endoscopy preformed.   01/20/2022 Amy Esterwood PA-C (GI) - Patient was seen for chronic GERD and additional concerns. Increased Pantoprazole 40 mg to twice daily.   Hospital visits:  None  Medications: Outpatient Encounter Medications as of 04/01/2022  Medication Sig   amLODipine (NORVASC) 5 MG tablet TAKE 1 TABLET(5 MG) BY MOUTH DAILY   aspirin 81 MG EC tablet Take 81 mg by mouth daily. Swallow whole.   bismuth subsalicylate (PEPTO-BISMOL) 262 MG chewable tablet Chew 2 tablets (524 mg total) by mouth 4 (four) times daily.   blood glucose meter kit and supplies KIT Dispense based on patient and insurance preference. Use up to four times daily as directed.   Calcium Carb-Cholecalciferol (CALCIUM 1000 + D PO)    celecoxib (CELEBREX) 200 MG capsule Take 1 capsule (200 mg total) by mouth daily.   clobetasol ointment (TEMOVATE) 4.35 % Apply 1 Application topically 2 (two) times daily.   codeine 30 MG tablet  Take 1 tablet (30 mg total) by mouth every 6 (six) hours as needed.   diazepam (VALIUM) 5 MG tablet Take one tablet after signing papers at MRI and may repeat after 15-20 mins if necessary for anxiety. (Patient not taking: Reported on 02/10/2022)   doxycycline (VIBRAMYCIN) 100 MG capsule Take 1 capsule (100 mg total) by mouth 2 (two) times daily.   famotidine (PEPCID) 20 MG tablet TAKE 1 TABLET(20 MG) BY MOUTH TWICE DAILY   fexofenadine (ALLEGRA ALLERGY) 180 MG tablet Take 1 tablet (180 mg total) by mouth daily.   hyoscyamine (LEVSIN SL) 0.125 MG SL tablet Take 1 to 2 tablets under the tongue as needed for cramping and abdominal bloating.   mesalamine (LIALDA) 1.2 g EC tablet TAKE 4 TABLETS(4.8 GRAMS) BY MOUTH DAILY WITH BREAKFAST   metroNIDAZOLE (FLAGYL) 250 MG tablet Take 1 tablet (250 mg total) by mouth 4 (four) times daily.   Multiple Vitamins-Minerals (CENTRUM ADULTS PO) Take 1 tablet by mouth daily.    olmesartan (BENICAR) 5 MG tablet TAKE 2 TABLETS(10 MG) BY MOUTH DAILY   Omega-3 Fatty Acids (FISH OIL PO) 2,000 mg daily.   pantoprazole (PROTONIX) 40 MG tablet Take 1 tablet (40 mg total) by mouth 2 (two) times daily before a meal.   potassium chloride SA (KLOR-CON M) 20 MEQ tablet TAKE 1 TABLET(20 MEQ TOTAL) BY MOUTH DAILY   rosuvastatin (CRESTOR) 20 MG tablet Take 1 tablet (20 mg total) by mouth daily.   triamcinolone cream (KENALOG) 0.5 % APPLY TOPICALLY TO THE AFFECTED AREA TWICE DAILY AS NEEDED  FOR DRY SKIN   vitamin B-12 (CYANOCOBALAMIN) 1000 MCG tablet Take 1,000 mcg by mouth daily.   Facility-Administered Encounter Medications as of 04/01/2022  Medication   0.9 %  sodium chloride infusion  Fill History:  Dispensed Days Supply Quantity Provider Pharmacy  AMLODIPINE BESYLATE 5MG TABLETS 01/22/2022 90 90 each      Dispensed Days Supply Quantity Provider Pharmacy  CELECOXIB 200MG CAPSULES 02/18/2022 90 90 each      Dispensed Days Supply Quantity Provider Pharmacy  DIAZEPAM 5MG  TABLETS 05/16/2021 1 2 each      Dispensed Days Supply Quantity Provider Pharmacy  FAMOTIDINE 20MG TABLETS 03/26/2022 90 180 each      Dispensed Days Supply Quantity Provider Pharmacy  ALLERGY RELF (FEXOFENADINE) 180MG T 09/25/2021 30 30 each      Dispensed Days Supply Quantity Provider Pharmacy  HYOSCYAMINE 0.125MG SUBLINGUAL TABS 11/05/2020 25 50 each      Dispensed Days Supply Quantity Provider Pharmacy  MESALAMINE DR  1.2 GM TBEC 03/01/2022 85 340 tablet     METRONIDAZOLE 250MG TABLETS 02/24/2022 14 56 each    Dispensed Days Supply Quantity Provider Pharmacy  OLMESARTAN MEDOXOMIL 5MG TABLETS 01/06/2022 90 180 each      Dispensed Days Supply Quantity Provider Pharmacy  PANTOPRAZOLE 40MG TABLETS 01/20/2022 90 180 each      Dispensed Days Supply Quantity Provider Pharmacy  POTASSIUM CHLORIDE ER  20 MEQ TBCR 03/28/2022 84 84 tablet      Dispensed Days Supply Quantity Provider Pharmacy  ROSUVASTATIN 20MG TABLETS 02/28/2022 90 90 each      Dispensed Days Supply Quantity Provider Pharmacy  TRIAMCINOLONE 0.5% CREAM 15GM 02/08/2022 14 30 g     Reviewed chart prior to disease state call. Spoke with patient regarding BP  Recent Office Vitals: BP Readings from Last 3 Encounters:  02/19/22 (!) 151/77  02/10/22 122/70  02/03/22 (!) 116/54   Pulse Readings from Last 3 Encounters:  02/19/22 (!) 56  02/10/22 62  02/03/22 75    Wt Readings from Last 3 Encounters:  02/19/22 180 lb (81.6 kg)  02/10/22 180 lb 4.8 oz (81.8 kg)  02/03/22 178 lb (80.7 kg)     Kidney Function Lab Results  Component Value Date/Time   CREATININE 1.13 (H) 12/01/2021 08:37 PM   CREATININE 0.91 11/26/2021 10:22 AM   CREATININE 0.99 (H) 01/12/2020 11:31 AM   GFR 56.58 (L) 11/10/2021 04:49 PM   GFRNONAA 51 (L) 12/01/2021 08:37 PM   GFRNONAA 57 (L) 01/12/2020 11:31 AM   GFRAA 66 01/12/2020 11:31 AM       Latest Ref Rng & Units 12/01/2021    8:37 PM 11/26/2021   10:22 AM 11/10/2021    4:49 PM  BMP   Glucose 70 - 99 mg/dL 207  141  155   BUN 8 - 23 mg/dL _0 Creatinine 0.44 - 1.00 mg/dL 1.13  0.91  0.98   Sodium 135 - 145 mmol/L 137  140  138   Potassium 3.5 - 5.1 mmol/L 3.7  3.4  3.6   Chloride 98 - 111 mmol/L 104  105  102   CO2 22 - 32 mmol/L _1 Calcium 8.9 - 10.3 mg/dL 9.0  9.4  9.5    Current antihypertensive regimen:  Amlodipine 5 mg daily Olmesartan 5 mg 2 daily   How often are you checking your Blood Pressure? Patient states she is checking her blood pressures 2 times per week.  Current home BP readings: Patient states her last blood pressure reading was 123/79 on 03/27/22. She states her blood pressure machine is not working properly and is planning to get a new machine soon.    What recent interventions/DTPs have been made by any provider to improve Blood Pressure control since last CPP Visit: No recent interventions   Any recent hospitalizations or ED visits since last visit with CPP? No recent hospital visits.   What diet changes have been made to improve Blood Pressure Control?  Patient follows no specific way of eating Breakfast - patient will have oatmeal Lunch - patient will have a sandwich or salad Dinner - patient will have a meat, vegetable and starch  What exercise is being done to improve your Blood Pressure Control?   Patient works in the yard and walks   Adherence Review: Is the patient currently on ACE/ARB medication? Yes Does the patient have >5 day gap between last estimated fill dates? No  Care Gaps: AWV - scheduled 02/16/2023 Last BP - 151/77 on 02/19/2022 Last A1C - 6.7 on 11/10/2021 Pneumonia - never done Urine ACR - never done Hep C Screen - never done Tdap - never done Shingrix - never done Covid - overdue Eye exam - overdue  Star Rating Drugs: Rosuvastatin 20 mg - last filled 02/28/2022 90 DS at [facility]  Olmesartan 5 mg - last filled 01/06/2022 90 DS at [facility] [phone]

## 2022-04-14 ENCOUNTER — Telehealth: Payer: Self-pay

## 2022-04-14 ENCOUNTER — Other Ambulatory Visit: Payer: Self-pay

## 2022-04-14 DIAGNOSIS — B9681 Helicobacter pylori [H. pylori] as the cause of diseases classified elsewhere: Secondary | ICD-10-CM

## 2022-04-14 NOTE — Telephone Encounter (Signed)
-----   Message from Marice Potter, RN sent at 02/23/2022 12:02 PM EDT ----- Pt needs H. Pylori stool test. See Dr. Vivia Ewing comment below:   "4 weeks after treatment completed, check H. Pylori stool antigen to confirm eradication (must be off acid suppression therapy).  If if requiring long-term PPI, can actually check H. Pylori via  Diatherix swab (PPI does not need to be held for this test)"

## 2022-04-14 NOTE — Telephone Encounter (Signed)
Spoke with pt and let her know about H. Pylori stool test. Pt knows to come to 2nd floor to pick up diatherix stool test and return it to lab once completed.

## 2022-04-15 ENCOUNTER — Other Ambulatory Visit: Payer: Self-pay | Admitting: Cardiovascular Disease

## 2022-04-15 DIAGNOSIS — H268 Other specified cataract: Secondary | ICD-10-CM | POA: Diagnosis not present

## 2022-04-15 DIAGNOSIS — H25812 Combined forms of age-related cataract, left eye: Secondary | ICD-10-CM | POA: Diagnosis not present

## 2022-04-16 ENCOUNTER — Telehealth: Payer: Self-pay | Admitting: Family Medicine

## 2022-04-16 DIAGNOSIS — L301 Dyshidrosis [pompholyx]: Secondary | ICD-10-CM | POA: Diagnosis not present

## 2022-04-17 ENCOUNTER — Encounter: Payer: Self-pay | Admitting: Family Medicine

## 2022-04-17 ENCOUNTER — Telehealth (INDEPENDENT_AMBULATORY_CARE_PROVIDER_SITE_OTHER): Payer: Medicare Other | Admitting: Family Medicine

## 2022-04-17 VITALS — Ht 67.0 in | Wt 180.0 lb

## 2022-04-17 DIAGNOSIS — U071 COVID-19: Secondary | ICD-10-CM | POA: Diagnosis not present

## 2022-04-17 MED ORDER — ONDANSETRON HCL 4 MG PO TABS: 4.0000 mg | ORAL_TABLET | Freq: Three times a day (TID) | ORAL | 0 refills | Status: DC | PRN

## 2022-04-17 MED ORDER — MOLNUPIRAVIR EUA 200MG CAPSULE: 4.0000 | ORAL_CAPSULE | Freq: Two times a day (BID) | ORAL | 0 refills | Status: AC

## 2022-04-17 NOTE — Telephone Encounter (Signed)
error 

## 2022-04-17 NOTE — Progress Notes (Signed)
Virtual Visit via Telephone Note  I connected with Angela Pugh on 04/17/22 at  8:30 AM EST by telephone and verified that I am speaking with the correct person using two identifiers.   I discussed the limitations, risks, security and privacy concerns of performing an evaluation and management service by telephone and the availability of in person appointments. I also discussed with the patient that there may be a patient responsible charge related to this service. The patient expressed understanding and agreed to proceed.  Location patient: home Location provider: work or home office Participants present for the call: patient, provider Patient did not have a visit in the prior 7 days to address this/these issue(s).  Chief Complaint  Patient presents with   Covid Positive    Patient reports sx of chills, runny nose, coughing- clear phlegm, headache, fever and bodyache. Got tested yesterday. Sx started with sneezing on Wednesday. Currently taking robitussin for cough.      History of Present Illness: Pt is a 76 yo female with CAD, HTN, LBBB, nonischemic cardiomyopathy, Emphysema lung, h/o diverticulitis, dysphagia, DM II, carpal tunnel, herniated disc, arthritis, allergies who was seen for acute concern.  Pt had L eye surgery (cataracts) Wed.  She started sneezing, having rhinorrhea that evening.  Then developed fever, chills, body aches, HA, loose stools, fatigue, brief nausea, and cough.  Appetite is decreased.   Had a positive at home COVID test yesterday 11/30.  Pt took Robitussin with honey and goody powder.  Denies sore throat, ear pressure.  Pt has not been taking any of her regular meds x 2 days since being sick as she doesn't want to take them on an empty stomach.  Pt had another COVID vaccine a few months ago.  Pt mentions her bp dropped prior to having her eye surgery.  It then improved after rechecking it a few times.   Observations/Objective: Patient sounds cheerful and  well on the phone. I do not appreciate any SOB. Speech and thought processing are grossly intact. Patient reported vitals: RR   Assessment and Plan: COVID-19 virus infection  -symptoms started 04/15/22 with positive COVID test 04/16/22. -continue supportive care with OTC meds -Discussed r/b/a of antiviral meds.  Pt wishes to start antiviral med. -given strict precautions - Plan: molnupiravir EUA (LAGEVRIO) 200 mg CAPS capsule, Zofran 4 mg  Follow Up Instructions:  F/u prn   [id number] 5-10 [id number] 11-20 9443 21-30 I did not refer this patient for an OV in the next 24 hours for this/these issue(s).  I discussed the assessment and treatment plan with the patient. The patient was provided an opportunity to ask questions and all were answered. The patient agreed with the plan and demonstrated an understanding of the instructions.   The patient was advised to call back or seek an in-person evaluation if the symptoms worsen or if the condition fails to improve as anticipated.  I provided 22 minutes of non-face-to-face time during this encounter.   [name], MD

## 2022-04-21 ENCOUNTER — Ambulatory Visit: Payer: Medicare Other | Admitting: Cardiovascular Disease

## 2022-04-26 ENCOUNTER — Other Ambulatory Visit: Payer: Self-pay | Admitting: Gastroenterology

## 2022-04-26 DIAGNOSIS — R1013 Epigastric pain: Secondary | ICD-10-CM

## 2022-04-27 NOTE — Telephone Encounter (Signed)
This is a patient of Dr Ardis Hughs, but it looks like you are in charge of patient care for now.  Please advise refills.   Thank you

## 2022-04-27 NOTE — Telephone Encounter (Signed)
Chart reviewed. On Lialda 4.8 gm/day for SCAD. Ok to refill w/ 90 day supply and RF 3. Thanks

## 2022-04-29 ENCOUNTER — Ambulatory Visit: Payer: Medicare Other | Admitting: Cardiovascular Disease

## 2022-05-06 ENCOUNTER — Ambulatory Visit: Payer: Medicare Other | Attending: Cardiovascular Disease | Admitting: Cardiovascular Disease

## 2022-05-06 ENCOUNTER — Encounter: Payer: Self-pay | Admitting: Cardiovascular Disease

## 2022-05-06 ENCOUNTER — Other Ambulatory Visit: Payer: Medicare Other

## 2022-05-06 VITALS — BP 126/70 | HR 60 | Ht 67.0 in | Wt 183.0 lb

## 2022-05-06 DIAGNOSIS — I447 Left bundle-branch block, unspecified: Secondary | ICD-10-CM | POA: Diagnosis not present

## 2022-05-06 DIAGNOSIS — I10 Essential (primary) hypertension: Secondary | ICD-10-CM

## 2022-05-06 DIAGNOSIS — E782 Mixed hyperlipidemia: Secondary | ICD-10-CM | POA: Diagnosis not present

## 2022-05-06 DIAGNOSIS — I428 Other cardiomyopathies: Secondary | ICD-10-CM | POA: Diagnosis not present

## 2022-05-06 NOTE — Assessment & Plan Note (Signed)
History of hyperlipidemia on rosuvastatin every other day with lipid profile performed 01/15/2021 revealing total cholesterol 233, LDL of 178 and HDL 51.  I am going to recheck a lipid liver profile.

## 2022-05-06 NOTE — Assessment & Plan Note (Signed)
History of nonischemic cardiomyopathy with ejection fraction initially in the 40% range and most recently 2D echo performed 12/18/2021 revealed EF of 50 to 55% with no significant valvular abnormalities.  She does have mild ascending thoracic aortic dilatation measuring 40 mm.

## 2022-05-06 NOTE — Patient Instructions (Signed)
Medication Instructions:  Your physician recommends that you continue on your current medications as directed. Please refer to the Current Medication list given to you today.  *If you need a refill on your cardiac medications before your next appointment, please call your pharmacy*   Lab Work: Your physician recommends that you return for lab work in: the next week or 2 for FASTING lipid/liver panel  If you have labs (blood work) drawn today and your tests are completely normal, you will receive your results only by: Helen (if you have MyChart) OR A paper copy in the mail If you have any lab test that is abnormal or we need to change your treatment, we will call you to review the results.   Follow-Up: At [facility], you and your health needs are our priority.  As part of our continuing mission to provide you with exceptional heart care, we have created designated Provider Care Teams.  These Care Teams include your primary Cardiologist (physician) and Advanced Practice Providers (APPs -  Physician Assistants and Nurse Practitioners) who all work together to provide you with the care you need, when you need it.  We recommend signing up for the patient portal called "MyChart".  Sign up information is provided on this After Visit Summary.  MyChart is used to connect with patients for Virtual Visits (Telemedicine).  Patients are able to view lab/test results, encounter notes, upcoming appointments, etc.  Non-urgent messages can be sent to your provider as well.   To learn more about what you can do with MyChart, go to [email or web address].    Your next appointment:   12 month(s)  The format for your next appointment:   In Person  Provider:   Quay Burow, MD

## 2022-05-06 NOTE — Progress Notes (Signed)
05/06/2022 Angela Pugh   Jun 15, 1945  [id number]  Primary Physician [name], MD Primary Cardiologist: Lorretta Harp MD [name], [place]  HPI:  Angela Pugh is a 76 y.o.   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  She was referred by Dr. Volanda Napoleon for cardiovascular evaluation because of newly recognized left bundle branch block and chest pain.  I last saw her in the office 01/15/2021.  She is accompanied by her daughter [name] today.  She does have a history of hypertension and diet-controlled diabetes.  She is never had a heart attack or stroke.  She has had recent carotid Dopplers performed in May revealing moderate bilateral ICA stenosis.  She had a newly recognized left bundle branch block as well.  She is had chest pain for several years which has increased in frequency and severity now occurring on a daily basis.  Is no relation to food or activity.   A CT FFR performed 03/04/2018 showed a physiologically significant lesion in the proximal LAD with an FFR of 0.78.  Based on this, we decided to proceed with outpatient diagnostic radial cardiac catheterization.   This was performed 03/17/2018 revealing normal coronary arteries and moderate LV dysfunction with an EF of 40%.   I did refer her to Dr. Haroldine Laws who did a cardiac MRI that showed no evidence of myocarditis and an event monitor that was normal as well.  She has a nonischemic cardiomyopathy with an EF in the 35 to 40% range.  She was on low-dose Benicar amlodipine.  She gets episodic episodes of breath and atypical chest pain that improves with Gas-X.   Since I saw her in the office a year and a half ago she continues to do well.  She did have an episode of syncope thought to be related to dehydration and atypical chest pain which resulted in an endoscopy and treatment for reflux.  She has known normal coronary  arteries by cath 03/17/2018.  Her most recent 2D echo performed 12/18/2021 revealed normal LV systolic function.   Current Meds  Medication Sig   amLODipine (NORVASC) 5 MG tablet TAKE 1 TABLET(5 MG) BY MOUTH DAILY   aspirin 81 MG EC tablet Take 81 mg by mouth daily. Swallow whole.   blood glucose meter kit and supplies KIT Dispense based on patient and insurance preference. Use up to four times daily as directed.   Calcium Carb-Cholecalciferol (CALCIUM 1000 + D PO)    celecoxib (CELEBREX) 200 MG capsule Take 1 capsule (200 mg total) by mouth daily.   clobetasol ointment (TEMOVATE) 9.16 % Apply 1 Application topically 2 (two) times daily.   fexofenadine (ALLEGRA ALLERGY) 180 MG tablet Take 1 tablet (180 mg total) by mouth daily. (Patient taking differently: Take 180 mg by mouth as needed.)   hyoscyamine (LEVSIN SL) 0.125 MG SL tablet Take 1 to 2 tablets under the tongue as needed for cramping and abdominal bloating.   mesalamine (LIALDA) 1.2 g EC tablet TAKE 4 TABLETS(4.8 GRAMS) BY MOUTH DAILY WITH BREAKFAST   Multiple Vitamins-Minerals (CENTRUM ADULTS PO) Take 1 tablet by mouth daily.    olmesartan (BENICAR) 5 MG tablet TAKE 2 TABLETS(10 MG) BY MOUTH DAILY   Omega-3 Fatty Acids (FISH OIL PO) 2,000 mg daily.   ondansetron (ZOFRAN) 4 MG tablet Take 1 tablet (4 mg total) by mouth every 8 (eight) hours as needed for nausea  or vomiting.   pantoprazole (PROTONIX) 40 MG tablet Take 1 tablet (40 mg total) by mouth 2 (two) times daily before a meal.   potassium chloride SA (KLOR-CON M) 20 MEQ tablet TAKE 1 TABLET(20 MEQ TOTAL) BY MOUTH DAILY   triamcinolone cream (KENALOG) 0.5 % APPLY TOPICALLY TO THE AFFECTED AREA TWICE DAILY AS NEEDED FOR DRY SKIN   vitamin B-12 (CYANOCOBALAMIN) 1000 MCG tablet Take 1,000 mcg by mouth daily.   [DISCONTINUED] bismuth subsalicylate (PEPTO-BISMOL) 262 MG chewable tablet Chew 2 tablets (524 mg total) by mouth 4 (four) times daily.   [DISCONTINUED] famotidine (PEPCID) 20 MG  tablet TAKE 1 TABLET(20 MG) BY MOUTH TWICE DAILY   Current Facility-Administered Medications for the 05/06/22 encounter (Office Visit) with Lorretta Harp, MD  Medication   0.9 %  sodium chloride infusion     Allergies  Allergen Reactions   Symbicort [Budesonide-Formoterol Fumarate] Shortness Of Breath, Swelling and Other (See Comments)    Pharyngeal edema, hoarseness, tightness in chest, lips became dark.   Acetaminophen Hives and Nausea Only   Azithromycin Hives and Nausea And Vomiting   Lipitor [Atorvastatin] Nausea Only    weakness   Zyrtec [Cetirizine]     Hives     Social History   Socioeconomic History   Marital status: Widowed    Spouse name: Not on file   Number of children: Not on file   Years of education: Not on file   Highest education level: Not on file  Occupational History   Not on file  Tobacco Use   Smoking status: Some Days    Packs/day: 0.50    Years: 20.00    Total pack years: 10.00    Types: Cigarettes    Last attempt to quit: 06/30/1982    Years since quitting: 39.8   Smokeless tobacco: Never   Tobacco comments:    Patient only smokes in social setting when drinking. Admits former h/o heavy smoking years ago smoked 1.5 packs/day.    12/04/2021 Patient states she has not spoke for about 3 weeks  Vaping Use   Vaping Use: Never used  Substance and Sexual Activity   Alcohol use: Yes    Comment: 05/04/2016 "might have a drink a couple days/year; might not"   Drug use: No   Sexual activity: Not Currently  Other Topics Concern   Not on file  Social History Narrative   Not on file   Social Determinants of Health   Financial Resource Strain: Low Risk  (02/10/2022)   Overall Financial Resource Strain (CARDIA)    Difficulty of Paying Living Expenses: Not hard at all  Food Insecurity: No Food Insecurity (02/10/2022)   Hunger Vital Sign    Worried About Running Out of Food in the Last Year: Never true    Ran Out of Food in the Last Year: Never  true  Transportation Needs: No Transportation Needs (02/10/2022)   PRAPARE - Hydrologist (Medical): No    Lack of Transportation (Non-Medical): No  Physical Activity: Sufficiently Active (02/10/2022)   Exercise Vital Sign    Days of Exercise per Week: 7 days    Minutes of Exercise per Session: 30 min  Stress: No Stress Concern Present (02/10/2022)   Agency    Feeling of Stress : Not at all  Social Connections: Moderately Isolated (01/28/2021)   Social Connection and Isolation Panel [NHANES]    Frequency of Communication with Friends and  Family: More than three times a week    Frequency of Social Gatherings with Friends and Family: More than three times a week    Attends Religious Services: More than 4 times per year    Active Member of Clubs or Organizations: No    Attends Archivist Meetings: Never    Marital Status: Widowed  Intimate Partner Violence: Not At Risk (01/28/2021)   Humiliation, Afraid, Rape, and Kick questionnaire    Fear of Current or Ex-Partner: No    Emotionally Abused: No    Physically Abused: No    Sexually Abused: No     Review of Systems: General: negative for chills, fever, night sweats or weight changes.  Cardiovascular: negative for chest pain, dyspnea on exertion, edema, orthopnea, palpitations, paroxysmal nocturnal dyspnea or shortness of breath Dermatological: negative for rash Respiratory: negative for cough or wheezing Urologic: negative for hematuria Abdominal: negative for nausea, vomiting, diarrhea, bright red blood per rectum, melena, or hematemesis Neurologic: negative for visual changes, syncope, or dizziness All other systems reviewed and are otherwise negative except as noted above.    Blood pressure 126/70, pulse 60, height _0  (1.702 m), weight 183 lb (83 kg).  General appearance: alert and no distress Neck: no adenopathy, no  carotid bruit, no JVD, supple, symmetrical, trachea midline, and thyroid not enlarged, symmetric, no tenderness/mass/nodules Lungs: clear to auscultation bilaterally Heart: regular rate and rhythm, S1, S2 normal, no murmur, click, rub or gallop Extremities: extremities normal, atraumatic, no cyanosis or edema Pulses: 2+ and symmetric Skin: Skin color, texture, turgor normal. No rashes or lesions Neurologic: Grossly normal  EKG not performed today  ASSESSMENT AND PLAN:   Hypertension History of essential hypertension blood pressure measured today at 126/70.  She is on amlodipine and Benicar.  HLD (hyperlipidemia) History of hyperlipidemia on rosuvastatin every other day with lipid profile performed 01/15/2021 revealing total cholesterol 233, LDL of 178 and HDL 51.  I am going to recheck a lipid liver profile.  Left bundle branch block Chronic  Nonischemic cardiomyopathy ([place]) History of nonischemic cardiomyopathy with ejection fraction initially in the 40% range and most recently 2D echo performed 12/18/2021 revealed EF of 50 to 55% with no significant valvular abnormalities.  She does have mild ascending thoracic aortic dilatation measuring 40 mm.     Lorretta Harp MD FACP,FACC,FAHA, [facility] 05/06/2022 2:48 PM

## 2022-05-06 NOTE — Assessment & Plan Note (Signed)
Chronic. 

## 2022-05-06 NOTE — Assessment & Plan Note (Signed)
History of essential hypertension blood pressure measured today at 126/70.  She is on amlodipine and Benicar.

## 2022-05-06 NOTE — Addendum Note (Signed)
Addended by: Beatrix Fetters on: 05/06/2022 02:56 PM   Modules accepted: Orders

## 2022-05-07 DIAGNOSIS — H25811 Combined forms of age-related cataract, right eye: Secondary | ICD-10-CM | POA: Diagnosis not present

## 2022-05-19 ENCOUNTER — Ambulatory Visit: Payer: Medicare Other

## 2022-05-20 DIAGNOSIS — H268 Other specified cataract: Secondary | ICD-10-CM | POA: Diagnosis not present

## 2022-05-20 DIAGNOSIS — H25811 Combined forms of age-related cataract, right eye: Secondary | ICD-10-CM | POA: Diagnosis not present

## 2022-06-09 ENCOUNTER — Telehealth: Payer: Medicare Other

## 2022-06-16 ENCOUNTER — Other Ambulatory Visit: Payer: Self-pay | Admitting: Family Medicine

## 2022-06-16 DIAGNOSIS — K219 Gastro-esophageal reflux disease without esophagitis: Secondary | ICD-10-CM

## 2022-06-16 NOTE — Telephone Encounter (Signed)
Attempt to reach pt to confirm if she is still taking famotidine '20mg'$  as it appeared it was discontinue. Left a voicemail to call us back.

## 2022-06-17 LAB — HM DIABETES EYE EXAM

## 2022-06-23 ENCOUNTER — Encounter: Payer: Self-pay | Admitting: Podiatry

## 2022-06-23 ENCOUNTER — Ambulatory Visit (INDEPENDENT_AMBULATORY_CARE_PROVIDER_SITE_OTHER): Payer: Medicare Other | Admitting: Podiatry

## 2022-06-23 VITALS — BP 145/69 | HR 54 | Temp 98.0°F

## 2022-06-23 DIAGNOSIS — M2042 Other hammer toe(s) (acquired), left foot: Secondary | ICD-10-CM | POA: Diagnosis not present

## 2022-06-23 DIAGNOSIS — M2041 Other hammer toe(s) (acquired), right foot: Secondary | ICD-10-CM | POA: Diagnosis not present

## 2022-06-23 DIAGNOSIS — E119 Type 2 diabetes mellitus without complications: Secondary | ICD-10-CM

## 2022-06-23 DIAGNOSIS — Q828 Other specified congenital malformations of skin: Secondary | ICD-10-CM

## 2022-06-23 DIAGNOSIS — M79674 Pain in right toe(s): Secondary | ICD-10-CM | POA: Diagnosis not present

## 2022-06-23 DIAGNOSIS — E1151 Type 2 diabetes mellitus with diabetic peripheral angiopathy without gangrene: Secondary | ICD-10-CM

## 2022-06-23 DIAGNOSIS — B351 Tinea unguium: Secondary | ICD-10-CM

## 2022-06-23 DIAGNOSIS — M79675 Pain in left toe(s): Secondary | ICD-10-CM | POA: Diagnosis not present

## 2022-06-23 DIAGNOSIS — I447 Left bundle-branch block, unspecified: Secondary | ICD-10-CM | POA: Diagnosis not present

## 2022-06-23 DIAGNOSIS — I10 Essential (primary) hypertension: Secondary | ICD-10-CM | POA: Diagnosis not present

## 2022-06-23 DIAGNOSIS — I428 Other cardiomyopathies: Secondary | ICD-10-CM | POA: Diagnosis not present

## 2022-06-23 DIAGNOSIS — E782 Mixed hyperlipidemia: Secondary | ICD-10-CM | POA: Diagnosis not present

## 2022-06-23 LAB — HEPATIC FUNCTION PANEL
ALT: 9 IU/L (ref 0–32)
AST: 14 IU/L (ref 0–40)
Albumin: 4.5 g/dL (ref 3.8–4.8)
Alkaline Phosphatase: 82 IU/L (ref 44–121)
Bilirubin Total: 0.3 mg/dL (ref 0.0–1.2)
Bilirubin, Direct: 0.1 mg/dL (ref 0.00–0.40)
Total Protein: 7.6 g/dL (ref 6.0–8.5)

## 2022-06-23 LAB — LIPID PANEL
Chol/HDL Ratio: 3.7 ratio (ref 0.0–4.4)
Cholesterol, Total: 205 mg/dL — ABNORMAL HIGH (ref 100–199)
HDL: 56 mg/dL (ref >39)
LDL Chol Calc (NIH): 119 mg/dL — ABNORMAL HIGH (ref 0–99)
Triglycerides: 170 mg/dL — ABNORMAL HIGH (ref 0–149)
VLDL Cholesterol Cal: 30 mg/dL (ref 5–40)

## 2022-06-23 NOTE — Progress Notes (Signed)
ANNUAL DIABETIC FOOT EXAM  Subjective: Angela Pugh presents today for annual diabetic foot examination.  Chief Complaint  Patient presents with   Follow-up    Diabetic foot care Patient is a diabetic B.S.am-n/a Last A1c-5.7 PCP-Dr. Larene Beach Banks/Last visit August 2023 Patient is taking all medications prescribed.   Patient relates long h/o diabetes.  Patient denies any h/o foot wounds.  Patient denies any numbness, tingling, burning, or pins/needle sensation in feet.  Risk factors: diabetes, neuropathy, HTN, hyperlipidemia, current tobacco user.  [name], MD is patient's PCP.   Past Medical History:  Diagnosis Date   Arthritis    back, knees, shoulder, hands , injections in pelvis, for bone spurs (05/04/2016)   Chronic back pain    Diverticulosis    History of kidney stones    HLD (hyperlipidemia)    Hypertension    Migraine    hx (05/04/2016)   Peripheral neuropathy    Radiculopathy    Sinus complaint    Sinus headache    occasional (05/04/2016)   Type II diabetes mellitus ([place])    diet controlled, no meds now, was on insulin & po treatment at one time     Vertigo    Patient Active Problem List   Diagnosis Date Noted   Headache above the eye region 02/19/2021   Nasal congestion 02/19/2021   Emphysema lung ([place]) 10/04/2020   Abdominal pain, epigastric 03/18/2020   Low back pain without sciatica 10/30/2019   Body mass index (BMI) 26.0-26.9, adult 10/23/2019   Nonischemic cardiomyopathy ([place]) 04/12/2018   Chest pain 01/25/2018   Left bundle branch block 01/25/2018   Carotid artery disease ([place]) 01/25/2018   Seasonal allergies 10/18/2017   Arthritis 10/18/2017   Pre-diabetes 10/18/2017   Protein-calorie malnutrition, severe 05/05/2016   Dysphagia 05/04/2016   HNP (herniated nucleus pulposus) with myelopathy, cervical 04/17/2016   Carpal tunnel syndrome 02/10/2016   Cervical spondylosis with radiculopathy 02/10/2016   Abdominal pain  02/01/2016   Acute diverticulitis 02/01/2016   HLD (hyperlipidemia) 02/01/2016   Diabetes mellitus without complication ([place]) 86/76/1949   Hypokalemia 02/01/2016   Hypertension    Cervical spondylosis without myelopathy 01/13/2016   Bilateral carpal tunnel syndrome 01/13/2016   Herniated lumbar intervertebral disc 06/30/2013    Class: Acute   Past Surgical History:  Procedure Laterality Date   ANTERIOR CERVICAL DECOMP/DISCECTOMY FUSION N/A 04/17/2016   Procedure: ANTERIOR CERVICAL DECOMPRESSION/DISCECTOMY FUSION CERVICAL THREE - CERVICAL FOUR, POSTERIOR CERVICAL DISCECTOMY CERVICAL SEVEN -THORASIC ONE LEFT;  Surgeon: Ashok Pall, MD;  Location: [place];  Service: Neurosurgery;  Laterality: N/A;  Anterior/Posterior   ANTERIOR FUSION CERVICAL SPINE  2000   Dr. Carloyn Manner; "for bone spurs; put hardware in too"   BACK SURGERY     COLONOSCOPY  06/10/2016   [facility]   LEFT HEART CATH AND CORONARY ANGIOGRAPHY N/A 03/17/2018   Procedure: LEFT HEART CATH AND CORONARY ANGIOGRAPHY;  Surgeon: Lorretta Harp, MD;  Location: [facility];  Service: Cardiovascular;  Laterality: N/A;   LUMBAR LAMINECTOMY Left 06/30/2013   Procedure: Left L3-4 Extraforaminal approach to excise far lateral herniated nucleus pulposus;  Surgeon: Jessy Oto, MD;  Location: [facility];  Service: Orthopedics;  Laterality: Left;   TUBAL LIGATION     VAGINAL HYSTERECTOMY     Current Outpatient Medications on File Prior to Visit  Medication Sig Dispense Refill   amLODipine (NORVASC) 5 MG tablet TAKE 1 TABLET(5 MG) BY MOUTH DAILY 90 tablet 1   aspirin 81 MG EC  tablet Take 81 mg by mouth daily. Swallow whole.     blood glucose meter kit and supplies KIT Dispense based on patient and insurance preference. Use up to four times daily as directed. 1 each 0   Calcium Carb-Cholecalciferol (CALCIUM 1000 + D PO)      celecoxib (CELEBREX) 200 MG capsule Take 1 capsule (200 mg total) by mouth daily. 270 capsule 3   clobetasol ointment  (TEMOVATE) 3.41 % Apply 1 Application topically 2 (two) times daily. 30 g 0   fexofenadine (ALLEGRA ALLERGY) 180 MG tablet Take 1 tablet (180 mg total) by mouth daily. (Patient taking differently: Take 180 mg by mouth as needed.) 30 tablet 3   hyoscyamine (LEVSIN SL) 0.125 MG SL tablet Take 1 to 2 tablets under the tongue as needed for cramping and abdominal bloating. 50 tablet 3   mesalamine (LIALDA) 1.2 g EC tablet TAKE 4 TABLETS(4.8 GRAMS) BY MOUTH DAILY WITH BREAKFAST 360 tablet 2   Multiple Vitamins-Minerals (CENTRUM ADULTS PO) Take 1 tablet by mouth daily.      olmesartan (BENICAR) 5 MG tablet TAKE 2 TABLETS(10 MG) BY MOUTH DAILY 60 tablet 3   Omega-3 Fatty Acids (FISH OIL PO) 2,000 mg daily.     ondansetron (ZOFRAN) 4 MG tablet Take 1 tablet (4 mg total) by mouth every 8 (eight) hours as needed for nausea or vomiting. 20 tablet 0   pantoprazole (PROTONIX) 40 MG tablet Take 1 tablet (40 mg total) by mouth 2 (two) times daily before a meal. 60 tablet 6   potassium chloride SA (KLOR-CON M) 20 MEQ tablet TAKE 1 TABLET(20 MEQ) BY MOUTH DAILY 90 tablet 1   rosuvastatin (CRESTOR) 20 MG tablet Take 1 tablet (20 mg total) by mouth daily. 90 tablet 3   triamcinolone cream (KENALOG) 0.5 % APPLY TOPICALLY TO THE AFFECTED AREA TWICE DAILY AS NEEDED FOR DRY SKIN 30 g 3   vitamin B-12 (CYANOCOBALAMIN) 1000 MCG tablet Take 1,000 mcg by mouth daily.     Current Facility-Administered Medications on File Prior to Visit  Medication Dose Route Frequency Provider Last Rate Last Admin   0.9 %  sodium chloride infusion  500 mL Intravenous Once Cirigliano, Vito V, DO        Allergies  Allergen Reactions   Symbicort [Budesonide-Formoterol Fumarate] Shortness Of Breath, Swelling and Other (See Comments)    Pharyngeal edema, hoarseness, tightness in chest, lips became dark.   Acetaminophen Hives and Nausea Only   Azithromycin Hives and Nausea And Vomiting   Lipitor [Atorvastatin] Nausea Only    weakness    Zyrtec [Cetirizine]     Hives    Social History   Occupational History   Not on file  Tobacco Use   Smoking status: Some Days    Packs/day: 0.50    Years: 20.00    Total pack years: 10.00    Types: Cigarettes    Last attempt to quit: 06/30/1982    Years since quitting: 40.0   Smokeless tobacco: Never   Tobacco comments:    Patient only smokes in social setting when drinking. Admits former h/o heavy smoking years ago smoked 1.5 packs/day.    12/04/2021 Patient states she has not spoke for about 3 weeks  Vaping Use   Vaping Use: Never used  Substance and Sexual Activity   Alcohol use: Yes    Comment: 05/04/2016 "might have a drink a couple days/year; might not"   Drug use: No   Sexual activity: Not Currently  Family History  Problem Relation Age of Onset   Uterine cancer Mother    Diabetes type II Sister    Diabetes Mellitus II Brother    Colon cancer Neg Hx    Pancreatic cancer Neg Hx    Esophageal cancer Neg Hx    Colon polyps Neg Hx    Rectal cancer Neg Hx    Stomach cancer Neg Hx    Immunization History  Administered Date(s) Administered   Fluad Quad(high Dose 65+) 02/10/2022   PFIZER(Purple Top)SARS-COV-2 Vaccination 06/24/2019, 07/15/2019, 02/27/2020     Review of Systems: Negative except as noted in the HPI.   Objective: Vitals:   06/23/22 0858  BP: (!) 145/69  Pulse: (!) 54  Temp: 98 F (36.7 C)  SpO2: 99%    Coralee Edberg is a pleasant 77 y.o. female in NAD. AAO X 3.  Vascular Examination: CFT <3 seconds b/l LE. Palpable DP pulse(s) right foot Faintly palpable PT pulse(s) b/l LE. Nonpalpable DP pulse(s) left foot. Pedal hair absent. No pain with calf compression b/l. Lower extremity skin temperature gradient within normal limits. No edema noted b/l LE. No cyanosis or clubbing noted b/l LE.  Dermatological Examination: Pedal skin is warm and supple b/l LE. No open wounds b/l LE. No interdigital macerations noted b/l LE. Toenails 2-5  bilaterally and L hallux elongated, discolored, dystrophic, thickened, and crumbly with subungual debris and tenderness to dorsal palpation. Porokeratotic lesion(s) plantar heel right foot, posterolateral right heel and submet head 2 b/l. No erythema, no edema, no drainage, no fluctuance.  Neurological Examination: Pt has subjective symptoms of neuropathy. Protective sensation intact 5/5 intact bilaterally with 10g monofilament b/l. Vibratory sensation intact b/l.  Musculoskeletal Examination: Muscle strength 5/5 to all lower extremity muscle groups bilaterally. No pain, crepitus or joint limitation noted with ROM bilateral LE. Hammertoe deformity noted 2-5 b/l.  Footwear Assessment: Does the patient wear appropriate shoes? Yes. Does the patient need inserts/orthotics? Yes.  Lab Results  Component Value Date   HGBA1C 6.7 (H) 11/10/2021   ADA Risk Categorization: High Risk  Patient has one or more of the following: Loss of protective sensation Absent pedal pulses Severe Foot deformity History of foot ulcer  Assessment: 1. Pain due to onychomycosis of toenails of both feet   2. Porokeratosis   3. Acquired hammertoes of both feet   4. Type II diabetes mellitus with peripheral circulatory disorder (HCC)   5. Encounter for diabetic foot exam ([place])     Plan: Orders Placed This Encounter  Procedures   For Home Use Only DME Diabetic Shoe    Dispense one pair extra depth shoes and 3 pair total contact insoles.   FOR HOME USE ONLY DME DIABETIC SHOE  -Patient was evaluated and treated. All patient's and/or POA's questions/concerns answered on today's visit. -Diabetic foot examination performed today. -Continue foot and shoe inspections daily. Monitor blood glucose per PCP/Endocrinologist's recommendations. -Continue supportive shoe gear daily. -Discussed diabetic shoe benefit available based on patient's diagnoses. Patient/POA would like to proceed. Order entered for one pair extra  depth shoes and 3 pair total contact insoles. Patient qualifies based on diagnoses. -Mycotic toenails 2-5 bilaterally and L hallux were debrided in length and girth with sterile nail nippers and dremel without iatrogenic bleeding. -Porokeratotic lesion(s) right heel x 3 and submet head 2 b/l pared and enucleated with sterile currette without incident. Total number of lesions debrided=4. -Patient/POA to call should there be question/concern in the interim. Return in about 3 months (around  09/21/2022).  Marzetta Board, DPM

## 2022-06-24 ENCOUNTER — Ambulatory Visit: Payer: Medicare Other | Admitting: Cardiovascular Disease

## 2022-06-29 ENCOUNTER — Emergency Department (HOSPITAL_COMMUNITY): Payer: Medicare Other

## 2022-06-29 ENCOUNTER — Other Ambulatory Visit: Payer: Self-pay

## 2022-06-29 ENCOUNTER — Emergency Department (HOSPITAL_COMMUNITY)
Admission: EM | Admit: 2022-06-29 | Discharge: 2022-06-30 | Disposition: A | Discharge status: Home / Self Care | Payer: Medicare Other | Attending: Emergency Medicine | Admitting: Emergency Medicine

## 2022-06-29 DIAGNOSIS — R0789 Other chest pain: Secondary | ICD-10-CM | POA: Diagnosis not present

## 2022-06-29 DIAGNOSIS — I2510 Atherosclerotic heart disease of native coronary artery without angina pectoris: Secondary | ICD-10-CM | POA: Diagnosis not present

## 2022-06-29 DIAGNOSIS — I10 Essential (primary) hypertension: Secondary | ICD-10-CM | POA: Insufficient documentation

## 2022-06-29 DIAGNOSIS — J432 Centrilobular emphysema: Secondary | ICD-10-CM | POA: Diagnosis not present

## 2022-06-29 DIAGNOSIS — Z7982 Long term (current) use of aspirin: Secondary | ICD-10-CM | POA: Insufficient documentation

## 2022-06-29 DIAGNOSIS — R079 Chest pain, unspecified: Secondary | ICD-10-CM

## 2022-06-29 DIAGNOSIS — R42 Dizziness and giddiness: Secondary | ICD-10-CM | POA: Diagnosis not present

## 2022-06-29 DIAGNOSIS — E119 Type 2 diabetes mellitus without complications: Secondary | ICD-10-CM | POA: Diagnosis not present

## 2022-06-29 DIAGNOSIS — K5730 Diverticulosis of large intestine without perforation or abscess without bleeding: Secondary | ICD-10-CM | POA: Diagnosis not present

## 2022-06-29 DIAGNOSIS — R0602 Shortness of breath: Secondary | ICD-10-CM | POA: Diagnosis not present

## 2022-06-29 DIAGNOSIS — I774 Celiac artery compression syndrome: Secondary | ICD-10-CM | POA: Diagnosis not present

## 2022-06-29 DIAGNOSIS — I701 Atherosclerosis of renal artery: Secondary | ICD-10-CM | POA: Diagnosis not present

## 2022-06-29 DIAGNOSIS — Z79899 Other long term (current) drug therapy: Secondary | ICD-10-CM | POA: Insufficient documentation

## 2022-06-29 DIAGNOSIS — R001 Bradycardia, unspecified: Secondary | ICD-10-CM | POA: Diagnosis not present

## 2022-06-29 LAB — CBC WITH DIFFERENTIAL/PLATELET
Abs Immature Granulocytes: 0.02 10*3/uL (ref 0.00–0.07)
Basophils Absolute: 0.1 10*3/uL (ref 0.0–0.1)
Basophils Relative: 1 %
Eosinophils Absolute: 0.2 10*3/uL (ref 0.0–0.5)
Eosinophils Relative: 3 %
HCT: 43.1 % (ref 36.0–46.0)
Hemoglobin: 14.5 g/dL (ref 12.0–15.0)
Immature Granulocytes: 0 %
Lymphocytes Relative: 47 %
Lymphs Abs: 3.2 10*3/uL (ref 0.7–4.0)
MCH: 31.5 pg (ref 26.0–34.0)
MCHC: 33.6 g/dL (ref 30.0–36.0)
MCV: 93.5 fL (ref 80.0–100.0)
Monocytes Absolute: 0.7 10*3/uL (ref 0.1–1.0)
Monocytes Relative: 10 %
Neutro Abs: 2.7 10*3/uL (ref 1.7–7.7)
Neutrophils Relative %: 39 %
Platelets: 259 10*3/uL (ref 150–400)
RBC: 4.61 MIL/uL (ref 3.87–5.11)
RDW: 12.8 % (ref 11.5–15.5)
WBC: 6.9 10*3/uL (ref 4.0–10.5)
nRBC: 0 % (ref 0.0–0.2)

## 2022-06-29 LAB — COMPREHENSIVE METABOLIC PANEL
ALT: 16 U/L (ref 0–44)
AST: 21 U/L (ref 15–41)
Albumin: 3.7 g/dL (ref 3.5–5.0)
Alkaline Phosphatase: 63 U/L (ref 38–126)
Anion gap: 6 (ref 5–15)
BUN: 16 mg/dL (ref 8–23)
CO2: 24 mmol/L (ref 22–32)
Calcium: 9.3 mg/dL (ref 8.9–10.3)
Chloride: 108 mmol/L (ref 98–111)
Creatinine, Ser: 1.09 mg/dL — ABNORMAL HIGH (ref 0.44–1.00)
GFR, Estimated: 53 mL/min — ABNORMAL LOW (ref >60)
Glucose, Bld: 110 mg/dL — ABNORMAL HIGH (ref 70–99)
Potassium: 4.1 mmol/L (ref 3.5–5.1)
Sodium: 138 mmol/L (ref 135–145)
Total Bilirubin: 0.7 mg/dL (ref 0.3–1.2)
Total Protein: 7.7 g/dL (ref 6.5–8.1)

## 2022-06-29 LAB — TROPONIN I (HIGH SENSITIVITY): Troponin I (High Sensitivity): 4 ng/L (ref <18)

## 2022-06-29 MED ORDER — FENTANYL CITRATE PF 50 MCG/ML IJ SOSY
25.0000 ug | PREFILLED_SYRINGE | Freq: Once | INTRAMUSCULAR | Status: AC
  Administered 2022-06-29: 25 ug via INTRAVENOUS
  Filled 2022-06-29: qty 1

## 2022-06-29 MED ORDER — SODIUM CHLORIDE 0.9 % IV BOLUS
1000.0000 mL | Freq: Once | INTRAVENOUS | Status: AC
  Administered 2022-06-29: 1000 mL via INTRAVENOUS

## 2022-06-29 MED ORDER — IOHEXOL 350 MG/ML SOLN
80.0000 mL | Freq: Once | INTRAVENOUS | Status: AC | PRN
  Administered 2022-06-29: 80 mL via INTRAVENOUS

## 2022-06-29 MED ORDER — ALUM & MAG HYDROXIDE-SIMETH 200-200-20 MG/5ML PO SUSP
30.0000 mL | Freq: Once | ORAL | Status: AC
  Administered 2022-06-29: 30 mL via ORAL
  Filled 2022-06-29: qty 30

## 2022-06-29 MED ORDER — HYDROMORPHONE HCL 1 MG/ML IJ SOLN
1.0000 mg | Freq: Once | INTRAMUSCULAR | Status: AC
  Administered 2022-06-29: 1 mg via INTRAVENOUS
  Filled 2022-06-29: qty 1

## 2022-06-29 MED ORDER — LIDOCAINE VISCOUS HCL 2 % MT SOLN
15.0000 mL | Freq: Once | OROMUCOSAL | Status: AC
  Administered 2022-06-29: 15 mL via ORAL
  Filled 2022-06-29: qty 15

## 2022-06-29 MED ORDER — ONDANSETRON HCL 4 MG/2ML IJ SOLN
4.0000 mg | Freq: Once | INTRAMUSCULAR | Status: AC
  Administered 2022-06-29: 4 mg via INTRAVENOUS
  Filled 2022-06-29: qty 2

## 2022-06-29 MED ORDER — ASPIRIN 81 MG PO CHEW
162.0000 mg | CHEWABLE_TABLET | Freq: Once | ORAL | Status: AC
  Administered 2022-06-29: 162 mg via ORAL
  Filled 2022-06-29: qty 2

## 2022-06-29 NOTE — ED Triage Notes (Signed)
C/o centralized cp radiating to left arm x1 arm with headache, chills, dizziness.   2 15m ASA PTA

## 2022-06-30 ENCOUNTER — Encounter (HOSPITAL_COMMUNITY): Payer: Self-pay

## 2022-06-30 ENCOUNTER — Telehealth: Payer: Self-pay | Admitting: Gastroenterology

## 2022-06-30 DIAGNOSIS — R0789 Other chest pain: Secondary | ICD-10-CM | POA: Diagnosis not present

## 2022-06-30 LAB — TROPONIN I (HIGH SENSITIVITY): Troponin I (High Sensitivity): 4 ng/L (ref <18)

## 2022-06-30 NOTE — Telephone Encounter (Signed)
Pt scheduled for appt with Vicie Mutters PA on 07/06/22 at 1:30 pm. Asked pt if she had turned in h. Pylori stool test that was ordered and pt said she had not. Advised pt to turn in H. Pylori stool test. Pt verbalized understanding and had no other concerns at end of call.

## 2022-06-30 NOTE — Discharge Instructions (Signed)
Your work up in the ED was reassuring and did not reveal a concerning cause of your chest pain. Follow up with your primary care doctor. Continue your prescribed medications. Return for new or concerning symptoms.

## 2022-06-30 NOTE — ED Notes (Signed)
Patient given discharge instructions and follow up care. Patient verbalized understanding. IV removed with catheter intact. Patient taken out of ED via wheelchair.

## 2022-06-30 NOTE — Telephone Encounter (Signed)
Patient is calling states she went to the ED last night thought she was having a heart attack but it was indigestion. Says she is still having it and can't wait until March to be seen. Please advise

## 2022-07-01 ENCOUNTER — Other Ambulatory Visit: Payer: Self-pay | Admitting: Family Medicine

## 2022-07-01 NOTE — ED Provider Notes (Signed)
[place] [facility] AT [facility] Provider Note   CSN: [id number] Arrival date & time: 06/29/22  2133     History  Chief Complaint  Patient presents with   Shortness of Breath   Chest Pain    Angela Pugh is a 77 y.o. female.  77 y/o female with hx of HTN, HLD, DM presents for evaluation of chest pain. Pain is midsternal and nonradiating, waxing and waning in severity with episodes of intense pain. She took 65m ASA x2 without change to symptoms. Feels that pain worsens a bit with breathing and causes her to "catch my breath". Denies hx of similar symptoms and no prior ACS. Denies syncope/near syncope, jaw or neck pain, back pain, abdominal pain, new or worsening extremity numbness or paresthesias, extremity weakness, N/V, fever.  The history is provided by the patient. No language interpreter was used.  Shortness of Breath Associated symptoms: chest pain   Chest Pain Associated symptoms: shortness of breath        Home Medications Prior to Admission medications   Medication Sig Start Date End Date Taking? Authorizing Provider  amLODipine (NORVASC) 5 MG tablet TAKE 1 TABLET(5 MG) BY MOUTH DAILY 04/15/22   BLorretta Harp MD  aspirin 81 MG EC tablet Take 81 mg by mouth daily. Swallow whole.    [provider]  blood glucose meter kit and supplies KIT Dispense based on patient and insurance preference. Use up to four times daily as directed. 02/10/22   BBillie Ruddy MD  Calcium Carb-Cholecalciferol (CALCIUM 1000 + D PO)     [provider]  celecoxib (CELEBREX) 200 MG capsule Take 1 capsule (200 mg total) by mouth daily. 02/19/22   NJessy Oto MD  clobetasol ointment (TEMOVATE) 0AB-123456789% Apply 1 Application topically 2 (two) times daily. 01/08/22   BBillie Ruddy MD  fexofenadine ([facility]) 180 MG tablet Take 1 tablet (180 mg total) by mouth daily. Patient taking differently: Take 180 mg by mouth as needed. 10/03/20    BBillie Ruddy MD  hyoscyamine (LEVSIN SL) 0.125 MG SL tablet Take 1 to 2 tablets under the tongue as needed for cramping and abdominal bloating. 04/24/21   JMilus Banister MD  mesalamine (LIALDA) 1.2 g EC tablet TAKE 4 TABLETS(4.8 GRAMS) BY MOUTH DAILY WITH BREAKFAST 04/27/22   Cirigliano, Vito V, DO  Multiple Vitamins-Minerals (CENTRUM ADULTS PO) Take 1 tablet by mouth daily.     [provider]  olmesartan (BENICAR) 5 MG tablet TAKE 2 TABLETS(10 MG) BY MOUTH DAILY 01/06/22   BBillie Ruddy MD  Omega-3 Fatty Acids (FISH OIL PO) 2,000 mg daily.    [provider]  ondansetron (ZOFRAN) 4 MG tablet Take 1 tablet (4 mg total) by mouth every 8 (eight) hours as needed for nausea or vomiting. 04/17/22   BBillie Ruddy MD  pantoprazole (PROTONIX) 40 MG tablet Take 1 tablet (40 mg total) by mouth 2 (two) times daily before a meal. 01/20/22   Esterwood, Amy S, PA-C  potassium chloride SA (KLOR-CON M) 20 MEQ tablet TAKE 1 TABLET(20 MEQ) BY MOUTH DAILY 06/16/22   BBillie Ruddy MD  rosuvastatin (CRESTOR) 20 MG tablet Take 1 tablet (20 mg total) by mouth daily. 12/03/21 03/03/22  BLorretta Harp MD  triamcinolone cream (KENALOG) 0.5 % APPLY TOPICALLY TO THE AFFECTED AREA TWICE DAILY AS NEEDED FOR DRY SKIN 01/12/22   BBillie Ruddy MD  vitamin B-12 (CYANOCOBALAMIN) 1000 MCG tablet  Take 1,000 mcg by mouth daily.    [provider]      Allergies    Symbicort [budesonide-formoterol fumarate], Acetaminophen, Azithromycin, Lipitor [atorvastatin], and Zyrtec [cetirizine]    Review of Systems   Review of Systems  Respiratory:  Positive for shortness of breath.   Cardiovascular:  Positive for chest pain.  Ten systems reviewed and are negative for acute change, except as noted in the HPI.    Physical Exam Updated Vital Signs BP (!) 156/67   Pulse (!) 53   Temp 98.4 F (36.9 C) (Oral)   Resp 15   Ht 5' 7"$  (1.702 m)   Wt 83 kg   SpO2 96%   BMI 28.66 kg/m    Physical Exam Vitals and nursing note reviewed.  Constitutional:      General: She is not in acute distress.    Appearance: She is well-developed. She is not diaphoretic.     Comments: Crying out intermittently in pain. Nontoxic appearing. Agitated.  HENT:     Head: Normocephalic and atraumatic.  Eyes:     General: No scleral icterus.    Conjunctiva/sclera: Conjunctivae normal.  Cardiovascular:     Rate and Rhythm: Regular rhythm. Bradycardia present.     Pulses: Normal pulses.  Pulmonary:     Effort: Pulmonary effort is normal. No respiratory distress.     Breath sounds: No stridor. No wheezing or rales.     Comments: Respirations even and unlabored. Lungs CTAB. Musculoskeletal:        General: Normal range of motion.     Cervical back: Normal range of motion.     Comments: No BLE edema  Skin:    General: Skin is warm and dry.     Coloration: Skin is not pale.     Findings: No erythema or rash.  Neurological:     Mental Status: She is alert and oriented to person, place, and time.     Coordination: Coordination normal.  Psychiatric:        Behavior: Behavior normal.     ED Results / Procedures / Treatments   Labs (all labs ordered are listed, but only abnormal results are displayed) Labs Reviewed  COMPREHENSIVE METABOLIC PANEL - Abnormal; Notable for the following components:      Result Value   Glucose, Bld 110 (*)    Creatinine, Ser 1.09 (*)    GFR, Estimated 53 (*)    All other components within normal limits  CBC WITH DIFFERENTIAL/PLATELET  TROPONIN I (HIGH SENSITIVITY)  TROPONIN I (HIGH SENSITIVITY)    EKG EKG Interpretation  Date/Time:  Monday June 29 2022 21:41:22 EST Ventricular Rate:  52 PR Interval:  214 QRS Duration: 157 QT Interval:  482 QTC Calculation: 449 R Axis:   87 Text Interpretation: Sinus rhythm Borderline prolonged PR interval Left bundle branch block Confirmed by Davonna Belling [phone]) on 06/30/2022 7:12:39  PM  Radiology CT Angio Chest/Abd/Pel for Dissection W and/or Wo Contrast  Result Date: 06/30/2022 CLINICAL DATA:  Acute aortic syndrome (AAS) suspected. Chest pain radiating to the left arm, dizziness. EXAM: CT ANGIOGRAPHY CHEST, ABDOMEN AND PELVIS TECHNIQUE: Non-contrast CT of the chest was initially obtained. Multidetector CT imaging through the chest, abdomen and pelvis was performed using the standard protocol during bolus administration of intravenous contrast. Multiplanar reconstructed images and MIPs were obtained and reviewed to evaluate the vascular anatomy. RADIATION DOSE REDUCTION: This exam was performed according to the departmental dose-optimization program which includes automated exposure control, adjustment of  the mA and/or kV according to patient size and/or use of iterative reconstruction technique. CONTRAST:  19m OMNIPAQUE IOHEXOL 350 MG/ML SOLN COMPARISON:  CT abdomen pelvis 03/19/2020 FINDINGS: CTA CHEST FINDINGS Cardiovascular: The thoracic aorta is normal in course and caliber. No intramural hematoma, aneurysm, or dissection. Arch vasculature demonstrates normal anatomic configuration and is widely patent proximally. There is tortuosity of the common carotid arteries noted bilaterally, however, with resultant high-grade (greater than 75%) stenosis of the proximal right common carotid artery and axial image # 17/6. Mild coronary artery calcification. Global cardiac size within normal limits. No pericardial effusion. Central pulmonary arteries are of normal caliber. Mediastinum/Nodes: No enlarged mediastinal, hilar, or axillary lymph nodes. Thyroid gland, trachea, and esophagus demonstrate no significant findings. Lungs/Pleura: Mild centrilobular and paraseptal emphysema. Bibasilar dependent ground-glass opacity is new from prior examination and is nonspecific but may simply represent atelectasis. No pneumothorax or pleural effusion. No central obstructing lesion. 3 mm sub solid pulmonary  nodule within the a right middle lobe, axial image # 79/5, indeterminate. Musculoskeletal: Degenerative changes are seen within the thoracic spine. No acute bone abnormality. No lytic or blastic bone lesion. Review of the MIP images confirms the above findings. CTA ABDOMEN AND PELVIS FINDINGS VASCULAR Aorta: Moderate mixed atherosclerotic plaque within the abdominal aorta. No aneurysm or dissection. No periaortic inflammatory change or fluid collection identified. Celiac: 50% stenosis at the origin related to mass effect from the adjacent median arcuate ligament. Otherwise widely patent. No aneurysm or dissection. SMA: Patent without evidence of aneurysm, dissection, vasculitis or significant stenosis. Renals: Main right and single left renal arteries demonstrate a beaded appearance of their mid segments, right greater than left, in keeping with changes of fibromuscular dysplasia. There is superimposed greater than 50% stenoses of these vessels at the ostia. Tiny accessory right renal artery noted. IMA: Greater than 50% stenosis at the origin.  Distally patent. Inflow: Patent without evidence of aneurysm, dissection, vasculitis or significant stenosis. Veins: No obvious venous abnormality within the limitations of this arterial phase study. Review of the MIP images confirms the above findings. NON-VASCULAR Hepatobiliary: Multiple cysts within the left hepatic lobe, decreased in size since prior examination. Liver otherwise unremarkable. Gallbladder unremarkable. No intra or extrahepatic biliary ductal dilation. Pancreas: Unremarkable Spleen: Unremarkable Adrenals/Urinary Tract: Adrenal glands are unremarkable. Kidneys are normal, without renal calculi, focal lesion, or hydronephrosis. Bladder is unremarkable. Stomach/Bowel: Severe sigmoid diverticulosis without superimposed acute inflammatory change. Mild scattered diverticular seen within the ascending colon. Stomach, small bowel, and large bowel are otherwise  unremarkable. No evidence of obstruction or focal inflammation. Appendix normal. No free intraperitoneal gas or fluid. Lymphatic: No pathologic adenopathy within the abdomen and pelvis. Reproductive: Status post hysterectomy. No adnexal masses. Other: No abdominal wall hernia or abnormality. No abdominopelvic ascites. Musculoskeletal: No acute bone abnormality. Degenerative changes are seen within the lumbar spine and hips bilaterally. No lytic or blastic bone lesion. Review of the MIP images confirms the above findings. IMPRESSION: 1. No evidence of thoracoabdominal aortic aneurysm or dissection. 2. High-grade stenosis of the proximal right common carotid artery. Correlation with carotid artery duplex sonography may be helpful for further evaluation. 3. Mild coronary artery calcification. 4. Mild centrilobular and paraseptal emphysema. 5. 3 mm sub solid pulmonary nodule within the right middle lobe. No follow-up recommended. This recommendation follows the consensus statement: Guidelines for Management of Incidental Pulmonary Nodules Detected on CT Images: From the Fleischner Society 2017; Radiology 2017; 284:228-243. 6. Severe sigmoid diverticulosis without superimposed acute inflammatory change. 7. Greater than  50% ostial stenoses of the main renal arteries bilaterally. Superimposed changes of fibromuscular dysplasia. 8. 50% stenosis of the celiac artery origin related to mass effect from the adjacent median arcuate ligament. Aortic Atherosclerosis (ICD10-I70.0) and Emphysema (ICD10-J43.9). Electronically Signed   By: Fidela Salisbury M.D.   On: 06/30/2022 00:30   DG Chest 2 View  Result Date: 06/29/2022 CLINICAL DATA:  Sudden onset of mid chest pain. EXAM: CHEST - 2 VIEW COMPARISON:  12/04/2021. FINDINGS: Heart is enlarged and the mediastinal contour is within normal limits. There is atherosclerotic calcification of the aorta. No consolidation, effusion, or pneumothorax. Cervical spinal fusion hardware is  noted. Degenerative changes are present in the thoracic spine. IMPRESSION: 1. No active cardiopulmonary disease. 2. Cardiomegaly. Electronically Signed   By: Brett Fairy M.D.   On: 06/29/2022 22:44    Procedures Procedures    Medications Ordered in ED Medications  aspirin chewable tablet 162 mg (162 mg Oral Given 06/29/22 2219)  fentaNYL (SUBLIMAZE) injection 25 mcg (25 mcg Intravenous Given 06/29/22 2216)  sodium chloride 0.9 % bolus 1,000 mL (0 mLs Intravenous Stopped 06/30/22 0000)  ondansetron (ZOFRAN) injection 4 mg (4 mg Intravenous Given 06/29/22 2221)  alum & mag hydroxide-simeth (MAALOX/MYLANTA) 200-200-20 MG/5ML suspension 30 mL (30 mLs Oral Given 06/29/22 2221)    And  lidocaine (XYLOCAINE) 2 % viscous mouth solution 15 mL (15 mLs Oral Given 06/29/22 2221)  iohexol (OMNIPAQUE) 350 MG/ML injection 80 mL (80 mLs Intravenous Contrast Given 06/29/22 2352)  HYDROmorphone (DILAUDID) injection 1 mg (1 mg Intravenous Given 06/29/22 2303)    ED Course/ Medical Decision Making/ A&P                             Medical Decision Making Amount and/or Complexity of Data Reviewed Labs: ordered. Radiology: ordered.  Risk OTC drugs. Prescription drug management.   This patient presents to the ED for concern of chest pain, this involves an extensive number of treatment options, and is a complaint that carries with it a high risk of complications and morbidity.  The differential diagnosis includes ACS vs dissection vs Boerhaave's vs PTX vs PNA vs pericardial effusion vs esophageal spasm vs PUD vs biliary pathology   Co morbidities that complicate the patient evaluation  HTN HLD DM   Additional history obtained:  External records from outside source obtained and reviewed including reassuring echocardiogram from 12/2021. LVEF 50-55%.   Lab Tests:  I Ordered, and personally interpreted labs.  The pertinent results include:  Creatinine 1.09 (baseline, unchanged). Normal CBC. Troponin  negative x2.   Imaging Studies ordered:  I ordered imaging studies including CTA dissection study and CXR  I independently visualized and interpreted imaging which showed no acute cardiopulmonary abnormality  I independently visualized and interpreted CTA imaging which showed no evidence of aortic dissection or aneurysm.  I agree with the radiologist interpretation   Cardiac Monitoring:  The patient was maintained on a cardiac monitor.  I personally viewed and interpreted the cardiac monitored which showed an underlying rhythm of: NSR   Medicines ordered and prescription drug management:  I ordered medication including ASA, Fentanyl, Dilaudid and GI cocktail for chest pain  Reevaluation of the patient after these medicines showed that the patient improved I have reviewed the patients home medicines and have made adjustments as needed   Test Considered:  Lipase    Problem List / ED Course:  77 year old female presenting to the emergency department for chest  pain.  Noted to be crying out in pain intermittently.  Reports pain to be in her central chest and is nonradiating. EKG reviewed which does not show signs of acute ischemia.  The patient does have a left bundle branch block which is not new for her. Patient with a coronary CT per chart review on 03/03/2018 which showed mild dilation of the ascending aorta.  Given her degree of discomfort and seemingly severe pain, decision was made to proceed with CTA dissection study. Prior to completion of CTA, patient had a chest x-ray which resulted negative for acute cardiopulmonary abnormality.  Specifically, no pneumothorax, pneumonia, pleural effusion, free air.  Troponin negative. CTA subsequently resulted with no evidence of aortic dissection or aneurysm. Biliary pathology considered, though gallbladder unremarkable on CT.  LFTs preserved on metabolic panel. On reassessment, patient reports her pain has significantly improved.  She is  resting comfortably.  Though mildly hypertensive, her vital signs have been stable. Question PUD or esophageal spasm. Patient is already prescribed a PPI and I have recommended she remain compliant with this medication.   Reevaluation:  After the interventions noted above, I reevaluated the patient and found that they have :improved   Social Determinants of Health:  Good social support; family at bedside   Dispostion:  Considered admission; however, after review of the diagnostic results and the patient's response to treatment I feel that the patent would benefit from outpatient PCP follow up. Return precautions discussed and provided. Patient discharged in stable condition with no unaddressed concerns.         Final Clinical Impression(s) / ED Diagnoses Final diagnoses:  Nonspecific chest pain    Rx / DC Orders ED Discharge Orders     None         Antonietta Breach, PA-C 07/01/22 0433    Orpah Greek, MD 07/01/22 [phone]

## 2022-07-02 DIAGNOSIS — B9681 Helicobacter pylori [H. pylori] as the cause of diseases classified elsewhere: Secondary | ICD-10-CM | POA: Diagnosis not present

## 2022-07-02 DIAGNOSIS — K2970 Gastritis, unspecified, without bleeding: Secondary | ICD-10-CM | POA: Diagnosis not present

## 2022-07-03 NOTE — Progress Notes (Unsigned)
07/06/2022 Angela Pugh [id number] 08-27-1945  Referring provider: [name], MD Primary GI doctor: Dr. Bryan Lemma ( Dr. Ardis Hughs)  ASSESSMENT AND PLAN:  Helicobacter pylori gastritis 01/2022 with metaplasia, no dysplasia History of AVM duodenum Treated QUAD, no eradication study Now with worsening epigastric pain, nausea, GERD, dysphagia, melena She is on celebrex, advised to stop/cut back Recheck CBC, no anemia  Refilled protonix 40 mg BID, added on carafate EGD with possible dilatation to evaluate for stenosis, tumor, erosive/infectious esophagitis, and H pylori gastritis, AVM   I discussed risks of EGD with patient today, including risk of sedation, bleeding or perforation.  Was able to schedule for 02/22, will not get repeat H pylori since able to get patient in for EGD and repeat H pylori there. Patient provides understanding and gave verbal consent to proceed.  Diarrhea with history of IBS-D/SCAD Last colon 04/2020  Check for infection Check pancreatic elastase Check ESR Continue lialda.   Patient Care Team: [name], MD as PCP - General (Family Medicine) Bensimhon, Shaune Pascal, MD as PCP - Advanced Heart Failure (Cardiology) Lorretta Harp, MD as PCP - Cardiology (Cardiology) Viona Gilmore, [facility] (Inactive) as Pharmacist (Pharmacist)  HISTORY OF PRESENT ILLNESS: 77 y.o. female with a past medical history of chronic GERD, and significant diverticular disease with probable associated SCAD for which she has been on Lialda, echo 12/2021 EF 55-60%, no AS, normal PFTs, cath 2019 normal coronary arteries, and others listed below presents for evaluation of indigestion.   04/2020 EGD with finding of a small blue bleb in the distal esophagus, mild gastritis, no H. pylori. Gastric biopsies showed focal intestinal metaplasia no dysplasia and no H. pylori. 04/2020 Colonoscopy at that same time with significant chronic diverticular changes multiple small and  large diverticuli especially in the sigmoid colon with associated luminal narrowing and spasticity, no polyps. 02/03/2022 EGD for dysphagia and heartburn normal upper third of esophagus, blood found in esophagus, nonobstructive Schatzki ring treated with cold forceps for ring fracturing, mild nonulcer gastritis, single bleeding AVM in duodenum status post MR clips Pathology showed H. pylori gastritis and intestinal metaplasia without dysplasia treated with quad therapy.   No eradication study.  06/29/2022 went to ER with nonspecific chest pain CBC without anemia or leukocytosis, negative troponin, normal kidney and liver, given Zofran, GI cocktail with improvement of symptoms. CT angio chest abdomen pelvis showed no evidence of aortic aneurysm, high-grade stenosis proximal right common carotid artery, mild coronary artery calcification, emphysema, 3 mm solid pulmonary nodule right middle lobe follow-up recommended, severe sigmoid diverticulosis without diverticulitis, greater than 50% stenosis of main renal arteries bilaterally changes of fibromuscular dysplasia, 50% stenosis celiac artery  She states after her treatment with Quad therapy had improved GERD but continuing epigastric pain/discomfort with food, but then in the last month has been having worsening GERD, reflux, epigastric pain, nausea and dysphagia.  States last week had crushing chest pain, went to ER, had normal cardiac work up, GI cocktail helped with symptoms.  She has not been able to eat anything due to pain and feeling it was stuck mid esophagus.  She was having to sleep in a recliner and sit up due to reflux/pain.  Has been able to eat mash potatoes, soft foods.  She has had dark black pasty stool, BM 3-5 times a day, can have fecal incontinence, has been going on since sept or before.  She had some chills several weaks ago. She is on iron pill. She  is on pepto for the last week only.  She is on celebrex every other day, bASA every  night. No other NSAIDS.  Rare ETOH, denies tobacco use, no drug use.   She  reports that she has been smoking cigarettes. She has a 10.00 pack-year smoking history. She has never used smokeless tobacco. She reports current alcohol use. She reports that she does not use drugs.  RELEVANT LABS AND IMAGING: CBC    Component Value Date/Time   WBC 6.9 06/29/2022 2210   RBC 4.61 06/29/2022 2210   HGB 14.5 06/29/2022 2210   HGB 14.5 03/08/2018 1612   HCT 43.1 06/29/2022 2210   HCT 43.0 03/08/2018 1612   PLT 259 06/29/2022 2210   PLT 287 03/08/2018 1612   MCV 93.5 06/29/2022 2210   MCV 91 03/08/2018 1612   MCH 31.5 06/29/2022 2210   MCHC 33.6 06/29/2022 2210   RDW 12.8 06/29/2022 2210   RDW 12.3 03/08/2018 1612   LYMPHSABS 3.2 06/29/2022 2210   LYMPHSABS 3.6 (H) 03/08/2018 1612   MONOABS 0.7 06/29/2022 2210   EOSABS 0.2 06/29/2022 2210   EOSABS 0.1 03/08/2018 1612   BASOSABS 0.1 06/29/2022 2210   BASOSABS 0.1 03/08/2018 1612   Recent Labs    11/10/21 1649 11/26/21 1022 12/01/21 2037 06/29/22 2210  HGB 14.3 14.9 13.6 14.5     CMP     Component Value Date/Time   NA 138 06/29/2022 2210   NA 142 11/03/2018 0826   K 4.1 06/29/2022 2210   CL 108 06/29/2022 2210   CO2 24 06/29/2022 2210   GLUCOSE 110 (H) 06/29/2022 2210   BUN 16 06/29/2022 2210   BUN 13 11/03/2018 0826   CREATININE 1.09 (H) 06/29/2022 2210   CREATININE 0.99 (H) 01/12/2020 1131   CALCIUM 9.3 06/29/2022 2210   PROT 7.7 06/29/2022 2210   PROT 7.6 06/23/2022 1051   ALBUMIN 3.7 06/29/2022 2210   ALBUMIN 4.5 06/23/2022 1051   AST 21 06/29/2022 2210   ALT 16 06/29/2022 2210   ALKPHOS 63 06/29/2022 2210   BILITOT 0.7 06/29/2022 2210   BILITOT 0.3 06/23/2022 1051   GFRNONAA 53 (L) 06/29/2022 2210   GFRNONAA 57 (L) 01/12/2020 1131   GFRAA 66 01/12/2020 1131      Latest Ref Rng & Units 06/29/2022   10:10 PM 06/23/2022   10:51 AM 11/26/2021   10:22 AM  Hepatic Function  Total Protein 6.5 - 8.1 g/dL 7.7  7.6   7.5   Albumin 3.5 - 5.0 g/dL 3.7  4.5  3.5   AST 15 - 41 U/L 21  14  16   $ ALT 0 - 44 U/L 16  9  14   $ Alk Phosphatase 38 - 126 U/L 63  82  63   Total Bilirubin 0.3 - 1.2 mg/dL 0.7  0.3  0.5   Bilirubin, Direct 0.00 - 0.40 mg/dL  0.10        Current Medications:     Current Outpatient Medications (Cardiovascular):    amLODipine (NORVASC) 5 MG tablet, TAKE 1 TABLET(5 MG) BY MOUTH DAILY   olmesartan (BENICAR) 5 MG tablet, TAKE 2 TABLETS(10 MG) BY MOUTH DAILY   rosuvastatin (CRESTOR) 20 MG tablet, Take 1 tablet (20 mg total) by mouth daily.   Current Outpatient Medications (Respiratory):    fexofenadine (ALLEGRA ALLERGY) 180 MG tablet, Take 1 tablet (180 mg total) by mouth daily. (Patient taking differently: Take 180 mg by mouth as needed.)   Current Outpatient Medications (Analgesics):  aspirin 81 MG EC tablet, Take 81 mg by mouth daily. Swallow whole.   celecoxib (CELEBREX) 200 MG capsule, Take 1 capsule (200 mg total) by mouth daily.   Current Outpatient Medications (Hematological):    vitamin B-12 (CYANOCOBALAMIN) 1000 MCG tablet, Take 1,000 mcg by mouth daily.   Current Outpatient Medications (Other):    blood glucose meter kit and supplies KIT, Dispense based on patient and insurance preference. Use up to four times daily as directed.   Calcium Carb-Cholecalciferol (CALCIUM 1000 + D PO),    clobetasol ointment (TEMOVATE) [id number] %, Apply 1 Application topically 2 (two) times daily.   hyoscyamine (LEVSIN SL) 0.125 MG SL tablet, Take 1 to 2 tablets under the tongue as needed for cramping and abdominal bloating.   mesalamine (LIALDA) 1.2 g EC tablet, TAKE 4 TABLETS(4.8 GRAMS) BY MOUTH DAILY WITH BREAKFAST   Multiple Vitamins-Minerals (CENTRUM ADULTS PO), Take 1 tablet by mouth daily.    Omega-3 Fatty Acids (FISH OIL PO), 2,000 mg daily.   ondansetron (ZOFRAN) 4 MG tablet, Take 1 tablet (4 mg total) by mouth every 8 (eight) hours as needed for nausea or vomiting.   potassium  chloride SA (KLOR-CON M) 20 MEQ tablet, TAKE 1 TABLET(20 MEQ) BY MOUTH DAILY   sucralfate (CARAFATE) 1 g tablet, Take 1 tablet (1 g total) by mouth 4 (four) times daily -  with meals and at bedtime.   triamcinolone cream (KENALOG) 0.5 %, APPLY TOPICALLY TO THE AFFECTED AREA TWICE DAILY AS NEEDED FOR DRY SKIN   pantoprazole (PROTONIX) 40 MG tablet, Take 1 tablet (40 mg total) by mouth 2 (two) times daily before a meal.  Current Facility-Administered Medications (Other):    0.9 %  sodium chloride infusion  Medical History:  Past Medical History:  Diagnosis Date   Arthritis    back, knees, shoulder, hands , injections in pelvis, for bone spurs (05/04/2016)   Chronic back pain    Diverticulosis    History of kidney stones    HLD (hyperlipidemia)    Hypertension    Migraine    hx (05/04/2016)   Peripheral neuropathy    Radiculopathy    Sinus complaint    Sinus headache    occasional (05/04/2016)   Type II diabetes mellitus (HCC)    diet controlled, no meds now, was on insulin & po treatment at one time     Vertigo    Allergies:  Allergies  Allergen Reactions   Symbicort [Budesonide-Formoterol Fumarate] Shortness Of Breath, Swelling and Other (See Comments)    Pharyngeal edema, hoarseness, tightness in chest, lips became dark.   Acetaminophen Hives and Nausea Only   Azithromycin Hives and Nausea And Vomiting   Lipitor [Atorvastatin] Nausea Only    weakness   Zyrtec [Cetirizine]     Hives      Surgical History:  She  has a past surgical history that includes Anterior fusion cervical spine (2000); Lumbar laminectomy (Left, 06/30/2013); Anterior cervical decomp/discectomy fusion (N/A, 04/17/2016); Back surgery; Vaginal hysterectomy; Tubal ligation; LEFT HEART CATH AND CORONARY ANGIOGRAPHY (N/A, 03/17/2018); and Colonoscopy (06/10/2016). Family History:  Her family history includes Diabetes Mellitus II in her brother; Diabetes type II in her sister; Diverticulitis in her daughter;  Heart Problems in her daughter; SIDS in her son; Uterine cancer in her mother.  REVIEW OF SYSTEMS  : All other systems reviewed and negative except where noted in the History of Present Illness.  PHYSICAL EXAM: BP 124/80 (BP Location: Left Arm, Patient Position: Sitting, Cuff  Size: Normal)   Pulse 64   Ht 5' 6.25" (1.683 m)   Wt 183 lb 8 oz (83.2 kg)   BMI 29.39 kg/m  General Appearance: Well nourished, in no apparent distress. Head:   Normocephalic and atraumatic. Eyes:  sclerae anicteric,conjunctive pink  Respiratory: Respiratory effort normal, BS equal bilaterally without rales, rhonchi, wheezing. Cardio: RRR with no MRGs. Peripheral pulses intact.  Abdomen: Soft,  Non-distended ,active bowel sounds. mild- moderate tenderness in the epigastrium. Without guarding and Without rebound. No masses. Rectal: Not evaluated Musculoskeletal: Full ROM, Normal gait. Without edema. Skin:  Dry and intact without significant lesions or rashes Neuro: Alert and  oriented x4;  No focal deficits. Psych:  Cooperative. Normal mood and affect.    Vladimir Crofts, PA-C 1:54 PM

## 2022-07-04 ENCOUNTER — Other Ambulatory Visit: Payer: Self-pay | Admitting: Family Medicine

## 2022-07-04 DIAGNOSIS — K219 Gastro-esophageal reflux disease without esophagitis: Secondary | ICD-10-CM

## 2022-07-06 ENCOUNTER — Ambulatory Visit (INDEPENDENT_AMBULATORY_CARE_PROVIDER_SITE_OTHER): Payer: Medicare Other | Admitting: Physician Assistant

## 2022-07-06 ENCOUNTER — Encounter: Payer: Self-pay | Admitting: Physician Assistant

## 2022-07-06 ENCOUNTER — Other Ambulatory Visit: Payer: Self-pay | Admitting: Physician Assistant

## 2022-07-06 ENCOUNTER — Other Ambulatory Visit (INDEPENDENT_AMBULATORY_CARE_PROVIDER_SITE_OTHER): Payer: Medicare Other

## 2022-07-06 VITALS — BP 124/80 | HR 64 | Ht 66.25 in | Wt 183.5 lb

## 2022-07-06 DIAGNOSIS — R1310 Dysphagia, unspecified: Secondary | ICD-10-CM

## 2022-07-06 DIAGNOSIS — K2970 Gastritis, unspecified, without bleeding: Secondary | ICD-10-CM

## 2022-07-06 DIAGNOSIS — K31811 Angiodysplasia of stomach and duodenum with bleeding: Secondary | ICD-10-CM | POA: Diagnosis not present

## 2022-07-06 DIAGNOSIS — K921 Melena: Secondary | ICD-10-CM | POA: Diagnosis not present

## 2022-07-06 DIAGNOSIS — R197 Diarrhea, unspecified: Secondary | ICD-10-CM | POA: Diagnosis not present

## 2022-07-06 DIAGNOSIS — B9681 Helicobacter pylori [H. pylori] as the cause of diseases classified elsewhere: Secondary | ICD-10-CM | POA: Diagnosis not present

## 2022-07-06 LAB — CBC WITH DIFFERENTIAL/PLATELET
Basophils Absolute: 0.1 10*3/uL (ref 0.0–0.1)
Basophils Relative: 1.1 % (ref 0.0–3.0)
Eosinophils Absolute: 0.3 10*3/uL (ref 0.0–0.7)
Eosinophils Relative: 4.4 % (ref 0.0–5.0)
HCT: 41.3 % (ref 36.0–46.0)
Hemoglobin: 14 g/dL (ref 12.0–15.0)
Lymphocytes Relative: 45.5 % (ref 12.0–46.0)
Lymphs Abs: 2.6 10*3/uL (ref 0.7–4.0)
MCHC: 34 g/dL (ref 30.0–36.0)
MCV: 95 fl (ref 78.0–100.0)
Monocytes Absolute: 0.5 10*3/uL (ref 0.1–1.0)
Monocytes Relative: 9.1 % (ref 3.0–12.0)
Neutro Abs: 2.3 10*3/uL (ref 1.4–7.7)
Neutrophils Relative %: 39.9 % — ABNORMAL LOW (ref 43.0–77.0)
Platelets: 324 10*3/uL (ref 150.0–400.0)
RBC: 4.35 Mil/uL (ref 3.87–5.11)
RDW: 13.3 % (ref 11.5–15.5)
WBC: 5.8 10*3/uL (ref 4.0–10.5)

## 2022-07-06 LAB — COMPREHENSIVE METABOLIC PANEL
ALT: 8 U/L (ref 0–35)
AST: 10 U/L (ref 0–37)
Albumin: 3.9 g/dL (ref 3.5–5.2)
Alkaline Phosphatase: 62 U/L (ref 39–117)
BUN: 15 mg/dL (ref 6–23)
CO2: 28 mEq/L (ref 19–32)
Calcium: 9.7 mg/dL (ref 8.4–10.5)
Chloride: 102 mEq/L (ref 96–112)
Creatinine, Ser: 1.13 mg/dL (ref 0.40–1.20)
GFR: 47.48 mL/min — ABNORMAL LOW (ref >60.00)
Glucose, Bld: 136 mg/dL — ABNORMAL HIGH (ref 70–99)
Potassium: 4.3 mEq/L (ref 3.5–5.1)
Sodium: 138 mEq/L (ref 135–145)
Total Bilirubin: 0.3 mg/dL (ref 0.2–1.2)
Total Protein: 7.5 g/dL (ref 6.0–8.3)

## 2022-07-06 LAB — TSH: TSH: 2.8 u[IU]/mL (ref 0.35–5.50)

## 2022-07-06 LAB — SEDIMENTATION RATE: Sed Rate: 42 mm/hr — ABNORMAL HIGH (ref 0–30)

## 2022-07-06 MED ORDER — PANTOPRAZOLE SODIUM 40 MG PO TBEC: 40.0000 mg | DELAYED_RELEASE_TABLET | Freq: Two times a day (BID) | ORAL | 3 refills | Status: DC

## 2022-07-06 MED ORDER — SUCRALFATE 1 G PO TABS: 1.0000 g | ORAL_TABLET | Freq: Three times a day (TID) | ORAL | 0 refills | Status: DC

## 2022-07-06 NOTE — Patient Instructions (Addendum)
Your provider has requested that you go to the basement level for lab work before leaving today. Press "B" on the elevator. The lab is located at the first door on the left as you exit the elevator.  Sending a medication called Carafate, this mechanically coats your stomach. Can cause constipation and darker stools. Take about 30 mins to 1 hour before food and before bed.  If the pill is too large to take, can dissolve it in water and a small orange juice glass or shot glass and take it more as a liquid.  You have been scheduled for an endoscopy. Please follow written instructions given to you at your visit today. If you use inhalers (even only as needed), please bring them with you on the day of your procedure.   If your blood pressure at your visit was 140/90 or greater, please contact your primary care physician to follow up on this.  If you are age 66 or older, your body mass index should be between 23-30. Your Body mass index is 29.39 kg/m. If this is out of the aforementioned range listed, please consider follow up with your Primary Care Provider.  If you are age 65 or younger, your body mass index should be between 19-25. Your Body mass index is 29.39 kg/m. If this is out of the aformentioned range listed, please consider follow up with your Primary Care Provider.   The [facility] providers would like to encourage you to use [facility] to communicate with providers for non-urgent requests or questions.  Due to long hold times on the telephone, sending your provider a message by [facility] may be a faster and more efficient way to get a response.  Please allow 48 business hours for a response.  Please remember that this is for non-urgent requests.   Due to recent changes in healthcare laws, you may see the results of your imaging and laboratory studies on MyChart before your provider has had a chance to review them.  We understand that in some cases there may be results that are confusing or  concerning to you. Not all laboratory results come back in the same time frame and the provider may be waiting for multiple results in order to interpret others.  Please give [place] 48 hours in order for your provider to thoroughly review all the results before contacting the office for clarification of your results.    Thank you for entrusting me with your care and choosing [facility].  Vicie Mutters PA-C

## 2022-07-07 ENCOUNTER — Other Ambulatory Visit: Payer: Medicare Other

## 2022-07-07 ENCOUNTER — Ambulatory Visit
Admission: RE | Admit: 2022-07-07 | Discharge: 2022-07-07 | Disposition: A | Discharge status: Home / Self Care | Payer: Medicare Other | Source: Ambulatory Visit | Attending: Family Medicine | Admitting: Family Medicine

## 2022-07-07 DIAGNOSIS — A09 Infectious gastroenteritis and colitis, unspecified: Secondary | ICD-10-CM | POA: Diagnosis not present

## 2022-07-07 DIAGNOSIS — B9681 Helicobacter pylori [H. pylori] as the cause of diseases classified elsewhere: Secondary | ICD-10-CM | POA: Diagnosis not present

## 2022-07-07 DIAGNOSIS — K2970 Gastritis, unspecified, without bleeding: Secondary | ICD-10-CM | POA: Diagnosis not present

## 2022-07-07 DIAGNOSIS — Z1231 Encounter for screening mammogram for malignant neoplasm of breast: Secondary | ICD-10-CM

## 2022-07-07 DIAGNOSIS — R197 Diarrhea, unspecified: Secondary | ICD-10-CM | POA: Diagnosis not present

## 2022-07-07 DIAGNOSIS — R1310 Dysphagia, unspecified: Secondary | ICD-10-CM | POA: Diagnosis not present

## 2022-07-07 DIAGNOSIS — A048 Other specified bacterial intestinal infections: Secondary | ICD-10-CM | POA: Diagnosis not present

## 2022-07-08 NOTE — Progress Notes (Signed)
Care Management & Coordination Services Pharmacy Note  07/14/2022 Name:  Angela Pugh MRN:  [id number] DOB:  March 01, 1946  Summary: BP at goal <130/80 LDL elevated (119 on 06/23/22) Recent ED Visit for stomach pain, following up with GI  Recommendations/Changes made from today's visit: -Continue to check BP at least once weekly and keep a log -Counseled on limiting fried, fatty foods in diet and 150 minutes of exercise per week -Continue medication therapy  Follow up plan: General follow up in 4-6 weeks, confirm stomach issues have fully resolved Pharmacist visit in 4 months   Subjective: Angela Pugh is an 77 y.o. year old female who is a primary patient of [name], MD.  The care coordination team was consulted for assistance with disease management and care coordination needs.    Engaged with patient by telephone for follow up visit.  Recent office visits: 04/17/22 Grier Mitts, MD - Video visit for COVID, START molnupiravir and zofran  Recent consult visits: 07/06/22 Vladimir Crofts, PA-C [facility]) For H pylori gastritis. START sucralfate 1g  06/23/22 Marzetta Board, DPM (Podiatry) - For foot care, no med changes, order placed for diabetic shoes 05/06/22 Lorretta Harp (Cardio) For htn follow up - STOP Pepto and famotidine 04/16/2022 Lajuana Ripple, MD (Dermatology) - For dishyidrosis 04/15/22 Lisabeth Pick ([place]) - Placement of eye lens due to [facility] visits: 06/29/22 ED for Chest Pain, No med changes, refer to gastro   Objective:  Lab Results  Component Value Date   CREATININE 1.13 07/06/2022   BUN 15 07/06/2022   GFR 47.48 (L) 07/06/2022   GFRNONAA 53 (L) 06/29/2022   GFRAA 66 01/12/2020   NA 138 07/06/2022   K 4.3 07/06/2022   CALCIUM 9.7 07/06/2022   CO2 28 07/06/2022   GLUCOSE 136 (H) 07/06/2022    Lab Results  Component Value Date/Time   HGBA1C 6.7 (H) 11/10/2021 04:49 PM   HGBA1C 6.3 10/03/2020 09:43 AM    GFR 47.48 (L) 07/06/2022 02:12 PM   GFR 56.58 (L) 11/10/2021 04:49 PM    Last diabetic Eye exam:  Lab Results  Component Value Date/Time   HMDIABEYEEXA No Retinopathy 06/17/2022 11:55 AM    Last diabetic Foot exam: No results found for: "HMDIABFOOTEX"   Lab Results  Component Value Date   CHOL 205 (H) 06/23/2022   HDL 56 06/23/2022   LDLCALC 119 (H) 06/23/2022   TRIG 170 (H) 06/23/2022   CHOLHDL 3.7 06/23/2022       Latest Ref Rng & Units 07/06/2022    2:12 PM 06/29/2022   10:10 PM 06/23/2022   10:51 AM  Hepatic Function  Total Protein 6.0 - 8.3 g/dL 7.5  7.7  7.6   Albumin 3.5 - 5.2 g/dL 3.9  3.7  4.5   AST 0 - 37 U/L '10  21  14   '$ ALT 0 - 35 U/L '8  16  9   '$ Alk Phosphatase 39 - 117 U/L 62  63  82   Total Bilirubin 0.2 - 1.2 mg/dL 0.3  0.7  0.3   Bilirubin, Direct 0.00 - 0.40 mg/dL   0.10     Lab Results  Component Value Date/Time   TSH 2.80 07/06/2022 02:12 PM   TSH 1.94 11/10/2021 04:49 PM   FREET4 0.96 11/10/2021 04:49 PM   FREET4 0.92 10/03/2020 09:43 AM       Latest Ref Rng & Units 07/06/2022    2:12 PM 06/29/2022  10:10 PM 12/01/2021    8:37 PM  CBC  WBC 4.0 - 10.5 K/uL 5.8  6.9  6.2   Hemoglobin 12.0 - 15.0 g/dL 14.0  14.5  13.6   Hematocrit 36.0 - 46.0 % 41.3  43.1  40.1   Platelets 150.0 - 400.0 K/uL 324.0  259  262     Lab Results  Component Value Date/Time   VD25OH 36.54 11/10/2021 04:49 PM   VD25OH 27 (L) 01/12/2020 11:31 AM   VITAMINB12 617 11/10/2021 04:49 PM   VITAMINB12 385 01/12/2020 11:31 AM    Clinical ASCVD: Yes  The 10-year ASCVD risk score (Arnett DK, et al., 2019) is: 64%   Values used to calculate the score:     Age: 38 years     Sex: Female     Is Non-Hispanic African American: Yes     Diabetic: Yes     Tobacco smoker: Yes     Systolic Blood Pressure: 123456 mmHg     Is BP treated: Yes     HDL Cholesterol: 56 mg/dL     Total Cholesterol: 205 mg/dL       02/10/2022    8:21 AM 12/26/2021    8:15 AM 12/11/2021    8:08 AM   Depression screen PHQ 2/9  Decreased Interest 0 0 2  Down, Depressed, Hopeless 0 0 0  PHQ - 2 Score 0 0 2  Altered sleeping  3 3  Tired, decreased energy  3 2  Change in appetite  1 2  Feeling bad or failure about yourself   0 0  Trouble concentrating  0 0  Moving slowly or fidgety/restless  0 0  Suicidal thoughts  0 0  PHQ-9 Score  7 9  Difficult doing work/chores  Somewhat difficult Somewhat difficult     Social History   Tobacco Use  Smoking Status Some Days   Packs/day: 0.50   Years: 20.00   Total pack years: 10.00   Types: Cigarettes   Last attempt to quit: 06/30/1982   Years since quitting: 40.0  Smokeless Tobacco Never  Tobacco Comments   Patient only smokes in social setting when drinking. Admits former h/o heavy smoking years ago smoked 1.5 packs/day.   12/04/2021 Patient states she has not spoke for about 3 weeks   BP Readings from Last 3 Encounters:  07/09/22 (!) 148/67  07/06/22 124/80  06/30/22 (!) 156/67   Pulse Readings from Last 3 Encounters:  07/09/22 (!) 50  07/06/22 64  06/30/22 (!) 53   Wt Readings from Last 3 Encounters:  07/09/22 183 lb (83 kg)  07/06/22 183 lb 8 oz (83.2 kg)  06/30/22 182 lb 15.7 oz (83 kg)   BMI Readings from Last 3 Encounters:  07/09/22 29.31 kg/m  07/06/22 29.39 kg/m  06/30/22 28.66 kg/m    Allergies  Allergen Reactions   Symbicort [Budesonide-Formoterol Fumarate] Shortness Of Breath, Swelling and Other (See Comments)    Pharyngeal edema, hoarseness, tightness in chest, lips became dark.   Acetaminophen Hives and Nausea Only   Azithromycin Hives and Nausea And Vomiting   Lipitor [Atorvastatin] Nausea Only    weakness   Zyrtec [Cetirizine]     Hives     Medications Reviewed Today     Reviewed by Maren Reamer, RPH (Pharmacist) on 07/14/22 at 0835  Med List Status: <None>   Medication Order Taking? Sig Documenting Provider Last Dose Status Informant  amLODipine (NORVASC) 5 MG tablet [id number] No TAKE  1 TABLET(5 MG)  BY MOUTH DAILY Lorretta Harp, MD 07/08/2022 Active   aspirin 81 MG EC tablet [id number] No Take 81 mg by mouth daily. Swallow whole. [provider] 07/08/2022 Active Self  blood glucose meter kit and supplies KIT [id number] No Dispense based on patient and insurance preference. Use up to four times daily as directed. [name], MD Unknown Active   Calcium Carb-Cholecalciferol (CALCIUM 1000 + D PO) [id number] No  [provider] 07/08/2022 Active   celecoxib (CELEBREX) 200 MG capsule [id number] No Take 1 capsule (200 mg total) by mouth daily. Jessy Oto, MD 07/08/2022 Active   clobetasol ointment (TEMOVATE) 0.05 % [id number] No Apply 1 Application topically 2 (two) times daily. [name], MD 07/08/2022 Active   fexofenadine [facility] ALLERGY) 180 MG tablet [id number] No Take 1 tablet (180 mg total) by mouth daily.  Patient not taking: Reported on 07/09/2022   [name], MD Not Taking Active   hyoscyamine (LEVSIN SL) 0.125 MG SL tablet [id number] No Take 1 to 2 tablets under the tongue as needed for cramping and abdominal bloating.  Patient not taking: Reported on 07/09/2022   Milus Banister, MD Not Taking Active   mesalamine (LIALDA) 1.2 g EC tablet [id number] No TAKE 4 TABLETS(4.8 GRAMS) BY MOUTH DAILY WITH BREAKFAST Cirigliano, Vito V, DO 07/08/2022 Active   Multiple Vitamins-Minerals (CENTRUM ADULTS PO) [id number] No Take 1 tablet by mouth daily.  [provider] 07/08/2022 Active Self  olmesartan (BENICAR) 5 MG tablet [id number] No TAKE 2 TABLETS(10 MG) BY MOUTH DAILY [name], MD 07/08/2022 Active   Omega-3 Fatty Acids (FISH OIL PO) [id number] No 2,000 mg daily. [provider] 07/08/2022 Active   ondansetron (ZOFRAN) 4 MG tablet [id number] No Take 1 tablet (4 mg total) by mouth every 8 (eight) hours as needed for nausea or vomiting.  Patient not taking: Reported on 07/09/2022   [name], MD Not Taking Active    pantoprazole (PROTONIX) 40 MG tablet [id number] No Take 1 tablet (40 mg total) by mouth 2 (two) times daily before a meal. Vladimir Crofts, PA-C 07/08/2022 Active   potassium chloride SA (KLOR-CON M) 20 MEQ tablet [id number] No TAKE 1 TABLET(20 MEQ) BY MOUTH DAILY [name], MD 07/08/2022 Active   rosuvastatin (CRESTOR) 20 MG tablet [id number] No Take 1 tablet (20 mg total) by mouth daily. Lorretta Harp, MD 07/08/2022 Expired 07/09/22 2359   sucralfate (CARAFATE) 1 g tablet [id number] No TAKE 1 TABLET(1 GRAM) BY MOUTH FOUR TIMES DAILY AT BEDTIME WITH MEALS Vladimir Crofts, [place] 07/08/2022 Active   triamcinolone cream (KENALOG) 0.5 % [id number] No APPLY TOPICALLY TO THE AFFECTED AREA TWICE DAILY AS NEEDED FOR DRY SKIN [name], MD 07/08/2022 Active   vitamin B-12 (CYANOCOBALAMIN) 1000 MCG tablet [id number] No Take 1,000 mcg by mouth daily. [provider] 07/08/2022 Active             SDOH:  (Social Determinants of Health) assessments and interventions performed: Yes SDOH Interventions    Flowsheet Row Care Coordination from 07/14/2022 in [place] from 02/10/2022 in [place] at [place] Management from 04/21/2021 in [place] [facility] [place] from 02/21/2020 in [place] at [place] Interventions Intervention Not Indicated Intervention Not Indicated -- Intervention Not Indicated  Housing Interventions Intervention Not Indicated -- -- Intervention Not Indicated  Transportation Interventions Intervention  Not Indicated Intervention Not Indicated Intervention Not Indicated Intervention Not Indicated  Utilities Interventions Intervention Not Indicated -- -- --  Depression Interventions/Treatment  -- -- -- PHQ2-9 Score <4 Follow-up Not Indicated  Financial Strain Interventions Intervention Not Indicated Intervention  Not Indicated Intervention Not Indicated Intervention Not Indicated  Physical Activity Interventions -- Intervention Not Indicated -- Intervention Not Indicated  Stress Interventions -- Intervention Not Indicated -- Intervention Not Indicated  Social Connections Interventions -- -- -- Intervention Not Indicated       Medication Assistance: None required.  Patient affirms current coverage meets needs.  Medication Access: Within the past 30 days, how often has patient missed a dose of medication? None Is a pillbox or other method used to improve adherence? Yes  Factors that may affect medication adherence? no barriers identified Are meds synced by current pharmacy? No  Are meds delivered by current pharmacy? No  Does patient experience delays in picking up medications due to transportation concerns? No   Upstream Services Reviewed: Is patient disadvantaged to use [facility]?: Yes  Current Rx insurance plan: [facility] Name and location of Current pharmacy:  [facility] [place], [place] [address] Phone: [phone] Fax: [phone]  [facility] Pharm[facility] services reviewed with patient today?: No  Patient requests to transfer care to [facility] Pharmacy?: No  Reason patient declined to change pharmacies: Disadvantaged due to insurance/mail order  Compliance/Adherence/Medication fill history: Care Gaps: Urine microalbumin Vaccines: Pneumonia, Shingrix, COVID, Tdap A1C  Star-Rating Drugs: Rosuvastatin '20mg'$  PDC 100% Olmesartan '5mg'$  PDC 56%   Assessment/Plan   Hypertension (BP goal <130/80) -Controlled -Current treatment: Amlodipine '5mg'$  1 qd Appropriate, Effective, Safe, Accessible Olmesartan '5mg'$  2 tabs qd Appropriate, Effective, Safe, Accessible -Medications previously tried: Losartan, HCTZ  -Current home readings: Ranges from 120-128/80 -Current dietary habits:  rarely adds any salt to food -Current exercise habits: yardwork and housework, does not like to stay sitting for long -Denies hypotensive/hypertensive symptoms -Educated on BP goals and benefits of medications for prevention of heart attack, stroke and kidney damage; Daily salt intake goal < 2300 mg; Exercise goal of 150 minutes per week; Importance of home blood pressure monitoring; -Counseled to monitor BP at home once weekly, document, and provide log at future appointments -Recommended to continue current medication  Hyperlipidemia: (LDL goal < 70) -Uncontrolled -Current treatment: Rosuvastatin '20mg'$  1 qd Appropriate, Effective, Safe, Accessible Omega Fish Oil OTC 2g daily Appropriate, Effective, Safe, Accessible -Medications previously tried: Community education officer  -Current dietary patterns: currently eating a bland diet to settle her stomach folliowing recent ED visit and states this is helping -Current exercise habits: see above -Educated on Cholesterol goals;  Benefits of statin for ASCVD risk reduction; Importance of limiting foods high in cholesterol; -Counseled on diet and exercise extensively Recommended to continue current medication   [place] Pharmacist [phone]

## 2022-07-09 ENCOUNTER — Ambulatory Visit (AMBULATORY_SURGERY_CENTER): Payer: Medicare Other | Admitting: Gastroenterology

## 2022-07-09 ENCOUNTER — Encounter: Payer: Self-pay | Admitting: Gastroenterology

## 2022-07-09 VITALS — BP 148/67 | HR 50 | Temp 96.8°F | Resp 14 | Ht 66.25 in | Wt 183.0 lb

## 2022-07-09 DIAGNOSIS — K2990 Gastroduodenitis, unspecified, without bleeding: Secondary | ICD-10-CM

## 2022-07-09 DIAGNOSIS — K2950 Unspecified chronic gastritis without bleeding: Secondary | ICD-10-CM | POA: Diagnosis not present

## 2022-07-09 DIAGNOSIS — K2970 Gastritis, unspecified, without bleeding: Secondary | ICD-10-CM

## 2022-07-09 DIAGNOSIS — R1310 Dysphagia, unspecified: Secondary | ICD-10-CM

## 2022-07-09 DIAGNOSIS — R110 Nausea: Secondary | ICD-10-CM

## 2022-07-09 DIAGNOSIS — R0989 Other specified symptoms and signs involving the circulatory and respiratory systems: Secondary | ICD-10-CM

## 2022-07-09 DIAGNOSIS — I10 Essential (primary) hypertension: Secondary | ICD-10-CM | POA: Diagnosis not present

## 2022-07-09 DIAGNOSIS — R12 Heartburn: Secondary | ICD-10-CM

## 2022-07-09 DIAGNOSIS — K222 Esophageal obstruction: Secondary | ICD-10-CM | POA: Diagnosis not present

## 2022-07-09 DIAGNOSIS — K3189 Other diseases of stomach and duodenum: Secondary | ICD-10-CM | POA: Diagnosis not present

## 2022-07-09 DIAGNOSIS — E785 Hyperlipidemia, unspecified: Secondary | ICD-10-CM | POA: Diagnosis not present

## 2022-07-09 DIAGNOSIS — E119 Type 2 diabetes mellitus without complications: Secondary | ICD-10-CM | POA: Diagnosis not present

## 2022-07-09 MED ORDER — SODIUM CHLORIDE 0.9 % IV SOLN: 500.0000 mL | Freq: Once | INTRAVENOUS | Status: DC

## 2022-07-09 NOTE — Progress Notes (Signed)
GASTROENTEROLOGY PROCEDURE H&P NOTE   Primary Care Physician: [name], MD    Reason for Procedure:   Hx of H pylori gastritis, epigastric pain, dysphagia, gastric IM, GERD, nausea  Plan:    EGD with dilation and/or biopsies  Patient is appropriate for endoscopic procedure(s) in the ambulatory ([place]) setting.  The nature of the procedure, as well as the risks, benefits, and alternatives were carefully and thoroughly reviewed with the patient. Ample time for discussion and questions allowed. The patient understood, was satisfied, and agreed to proceed.     HPI: Angela Pugh is a 77 y.o. female who presents for EGD for evaluation of dysphagia, GERD, MEG pain, nausea, and hx of H pylori gastritis and GIM.  Patient was most recently seen in the Gastroenterology Clinic on 07/06/2022.  No interval change in medical history since that appointment. Please refer to that note for full details regarding GI history and clinical presentation.   Past Medical History:  Diagnosis Date   Arthritis    back, knees, shoulder, hands , injections in pelvis, for bone spurs (05/04/2016)   Chronic back pain    Diverticulosis    History of kidney stones    HLD (hyperlipidemia)    Hypertension    Migraine    hx (05/04/2016)   Peripheral neuropathy    Radiculopathy    Sinus complaint    Sinus headache    occasional (05/04/2016)   Type II diabetes mellitus (HCC)    diet controlled, no meds now, was on insulin & po treatment at one time     Vertigo     Past Surgical History:  Procedure Laterality Date   ANTERIOR CERVICAL DECOMP/DISCECTOMY FUSION N/A 04/17/2016   Procedure: ANTERIOR CERVICAL DECOMPRESSION/DISCECTOMY FUSION CERVICAL THREE - CERVICAL FOUR, POSTERIOR CERVICAL DISCECTOMY CERVICAL SEVEN -THORASIC ONE LEFT;  Surgeon: Ashok Pall, MD;  Location: [facility];  Service: Neurosurgery;  Laterality: N/A;  Anterior/Posterior   ANTERIOR FUSION CERVICAL SPINE  2000   Dr. Carloyn Manner; "for bone  spurs; put hardware in too"   BACK SURGERY     COLONOSCOPY  06/10/2016   [facility]   LEFT HEART CATH AND CORONARY ANGIOGRAPHY N/A 03/17/2018   Procedure: LEFT HEART CATH AND CORONARY ANGIOGRAPHY;  Surgeon: Lorretta Harp, MD;  Location: [facility];  Service: Cardiovascular;  Laterality: N/A;   LUMBAR LAMINECTOMY Left 06/30/2013   Procedure: Left L3-4 Extraforaminal approach to excise far lateral herniated nucleus pulposus;  Surgeon: Jessy Oto, MD;  Location: [facility];  Service: Orthopedics;  Laterality: Left;   TUBAL LIGATION     VAGINAL HYSTERECTOMY      Prior to Admission medications   Medication Sig Start Date End Date Taking? Authorizing Provider  amLODipine (NORVASC) 5 MG tablet TAKE 1 TABLET(5 MG) BY MOUTH DAILY 04/15/22  Yes Lorretta Harp, MD  aspirin 81 MG EC tablet Take 81 mg by mouth daily. Swallow whole.   Yes [provider]  Calcium Carb-Cholecalciferol (CALCIUM 1000 + D PO)    Yes [provider]  celecoxib (CELEBREX) 200 MG capsule Take 1 capsule (200 mg total) by mouth daily. 02/19/22  Yes Jessy Oto, MD  clobetasol ointment (TEMOVATE) [id number] % Apply 1 Application topically 2 (two) times daily. 01/08/22  Yes [name], MD  mesalamine (LIALDA) 1.2 g EC tablet TAKE 4 TABLETS(4.8 GRAMS) BY MOUTH DAILY WITH BREAKFAST 04/27/22  Yes Kenedy Haisley V, DO  Multiple Vitamins-Minerals (CENTRUM ADULTS PO) Take 1 tablet by mouth  daily.    Yes [provider]  olmesartan (BENICAR) 5 MG tablet TAKE 2 TABLETS(10 MG) BY MOUTH DAILY 07/02/22  Yes [name], MD  Omega-3 Fatty Acids (FISH OIL PO) 2,000 mg daily.   Yes [provider]  pantoprazole (PROTONIX) 40 MG tablet Take 1 tablet (40 mg total) by mouth 2 (two) times daily before a meal. 07/06/22  Yes Vicie Mutters R, PA-C  potassium chloride SA (KLOR-CON M) 20 MEQ tablet TAKE 1 TABLET(20 MEQ) BY MOUTH DAILY 06/16/22  Yes [name], MD  rosuvastatin (CRESTOR) 20 MG tablet  Take 1 tablet (20 mg total) by mouth daily. 12/03/21 07/09/22 Yes Lorretta Harp, MD  sucralfate (CARAFATE) 1 g tablet TAKE 1 TABLET(1 GRAM) BY MOUTH FOUR TIMES DAILY AT BEDTIME WITH MEALS 07/06/22  Yes Vicie Mutters R, PA-C  triamcinolone cream (KENALOG) 0.5 % APPLY TOPICALLY TO THE AFFECTED AREA TWICE DAILY AS NEEDED FOR DRY SKIN 01/12/22  Yes [name], MD  vitamin B-12 (CYANOCOBALAMIN) 1000 MCG tablet Take 1,000 mcg by mouth daily.   Yes [provider]  blood glucose meter kit and supplies KIT Dispense based on patient and insurance preference. Use up to four times daily as directed. 02/10/22   [name], MD  fexofenadine (ALLEGRA ALLERGY) 180 MG tablet Take 1 tablet (180 mg total) by mouth daily. Patient not taking: Reported on 07/09/2022 10/03/20   [name], MD  hyoscyamine (LEVSIN SL) 0.125 MG SL tablet Take 1 to 2 tablets under the tongue as needed for cramping and abdominal bloating. Patient not taking: Reported on 07/09/2022 04/24/21   Milus Banister, MD  ondansetron (ZOFRAN) 4 MG tablet Take 1 tablet (4 mg total) by mouth every 8 (eight) hours as needed for nausea or vomiting. Patient not taking: Reported on 07/09/2022 04/17/22   [name], MD    Current Outpatient Medications  Medication Sig Dispense Refill   amLODipine (NORVASC) 5 MG tablet TAKE 1 TABLET(5 MG) BY MOUTH DAILY 90 tablet 1   aspirin 81 MG EC tablet Take 81 mg by mouth daily. Swallow whole.     Calcium Carb-Cholecalciferol (CALCIUM 1000 + D PO)      celecoxib (CELEBREX) 200 MG capsule Take 1 capsule (200 mg total) by mouth daily. 270 capsule 3   clobetasol ointment (TEMOVATE) [id number] % Apply 1 Application topically 2 (two) times daily. 30 g 0   mesalamine (LIALDA) 1.2 g EC tablet TAKE 4 TABLETS(4.8 GRAMS) BY MOUTH DAILY WITH BREAKFAST 360 tablet 2   Multiple Vitamins-Minerals (CENTRUM ADULTS PO) Take 1 tablet by mouth daily.      olmesartan (BENICAR) 5 MG tablet TAKE 2 TABLETS(10 MG) BY  MOUTH DAILY 180 tablet 0   Omega-3 Fatty Acids (FISH OIL PO) 2,000 mg daily.     pantoprazole (PROTONIX) 40 MG tablet Take 1 tablet (40 mg total) by mouth 2 (two) times daily before a meal. 180 tablet 3   potassium chloride SA (KLOR-CON M) 20 MEQ tablet TAKE 1 TABLET(20 MEQ) BY MOUTH DAILY 90 tablet 1   rosuvastatin (CRESTOR) 20 MG tablet Take 1 tablet (20 mg total) by mouth daily. 90 tablet 3   sucralfate (CARAFATE) 1 g tablet TAKE 1 TABLET(1 GRAM) BY MOUTH FOUR TIMES DAILY AT BEDTIME WITH MEALS 360 tablet 0   triamcinolone cream (KENALOG) 0.5 % APPLY TOPICALLY TO THE AFFECTED AREA TWICE DAILY AS NEEDED FOR DRY SKIN 30 g 3   vitamin B-12 (CYANOCOBALAMIN) 1000 MCG tablet  Take 1,000 mcg by mouth daily.     blood glucose meter kit and supplies KIT Dispense based on patient and insurance preference. Use up to four times daily as directed. 1 each 0   fexofenadine (ALLEGRA ALLERGY) 180 MG tablet Take 1 tablet (180 mg total) by mouth daily. (Patient not taking: Reported on 07/09/2022) 30 tablet 3   hyoscyamine (LEVSIN SL) 0.125 MG SL tablet Take 1 to 2 tablets under the tongue as needed for cramping and abdominal bloating. (Patient not taking: Reported on 07/09/2022) 50 tablet 3   ondansetron (ZOFRAN) 4 MG tablet Take 1 tablet (4 mg total) by mouth every 8 (eight) hours as needed for nausea or vomiting. (Patient not taking: Reported on 07/09/2022) 20 tablet 0   Current Facility-Administered Medications  Medication Dose Route Frequency Provider Last Rate Last Admin   0.9 %  sodium chloride infusion  500 mL Intravenous Once Glorious Flicker V, DO       0.9 %  sodium chloride infusion  500 mL Intravenous Once Antasia Haider V, DO        Allergies as of 07/09/2022 - Review Complete 07/09/2022  Allergen Reaction Noted   Symbicort [budesonide-formoterol fumarate] Shortness Of Breath, Swelling, and Other (See Comments) 12/26/2021   Acetaminophen Hives and Nausea Only 07/02/2010   Azithromycin Hives and  Nausea And Vomiting 10/18/2017   Lipitor [atorvastatin] Nausea Only 02/01/2016   Zyrtec [cetirizine]  08/25/2019    Family History  Problem Relation Age of Onset   Uterine cancer Mother    Diabetes type II Sister    Diabetes Mellitus II Brother    Diverticulitis Daughter    Heart Problems Daughter    SIDS Son    Colon cancer Neg Hx    Pancreatic cancer Neg Hx    Esophageal cancer Neg Hx    Colon polyps Neg Hx    Rectal cancer Neg Hx    Stomach cancer Neg Hx     Social History   Socioeconomic History   Marital status: Widowed    Spouse name: Not on file   Number of children: 6   Years of education: Not on file   Highest education level: Not on file  Occupational History   Occupation: retired  Tobacco Use   Smoking status: Some Days    Packs/day: 0.50    Years: 20.00    Total pack years: 10.00    Types: Cigarettes    Last attempt to quit: 06/30/1982    Years since quitting: 40.0   Smokeless tobacco: Never   Tobacco comments:    Patient only smokes in social setting when drinking. Admits former h/o heavy smoking years ago smoked 1.5 packs/day.    12/04/2021 Patient states she has not spoke for about 3 weeks  Vaping Use   Vaping Use: Never used  Substance and Sexual Activity   Alcohol use: Yes    Comment: 05/04/2016 "might have a drink a couple days/year; might not"   Drug use: No   Sexual activity: Not Currently  Other Topics Concern   Not on file  Social History Narrative   Not on file   Social Determinants of Health   Financial Resource Strain: Low Risk  (02/10/2022)   Overall Financial Resource Strain (CARDIA)    Difficulty of Paying Living Expenses: Not hard at all  Food Insecurity: No Food Insecurity (02/10/2022)   Hunger Vital Sign    Worried About Running Out of Food in the Last Year: Never true  Ran Out of Food in the Last Year: Never true  Transportation Needs: No Transportation Needs (02/10/2022)   PRAPARE - Radiographer, therapeutic (Medical): No    Lack of Transportation (Non-Medical): No  Physical Activity: Sufficiently Active (02/10/2022)   Exercise Vital Sign    Days of Exercise per Week: 7 days    Minutes of Exercise per Session: 30 min  Stress: No Stress Concern Present (02/10/2022)   [place]    Feeling of Stress : Not at all  Social Connections: Moderately Isolated (01/28/2021)   Social Connection and Isolation Panel [NHANES]    Frequency of Communication with Friends and Family: More than three times a week    Frequency of Social Gatherings with Friends and Family: More than three times a week    Attends Religious Services: More than 4 times per year    Active Member of [employer] or Organizations: No    Attends Archivist Meetings: Never    Marital Status: Widowed  Intimate Partner Violence: Not At Risk (01/28/2021)   Humiliation, Afraid, Rape, and Kick questionnaire    Fear of Current or Ex-Partner: No    Emotionally Abused: No    Physically Abused: No    Sexually Abused: No    Physical Exam: Vital signs in last 24 hours: @BP$  (!) 161/71   Pulse (!) 54   Temp (!) 96.8 F (36 C) (Skin)   Ht 5' 6.25" (1.683 m)   Wt 183 lb (83 kg)   SpO2 99%   BMI 29.31 kg/m  GEN: NAD EYE: Sclerae anicteric ENT: MMM CV: Non-tachycardic Pulm: CTA b/l GI: Soft, NT/ND NEURO:  Alert & Oriented x 3   Gerrit Heck, DO [facility]   07/09/2022 2:32 PM

## 2022-07-09 NOTE — Progress Notes (Signed)
Agree with the assessment and plan as outlined by Amanda Collier, PA-C. ? ?Caio Devera, DO, FACG ? ?

## 2022-07-09 NOTE — Progress Notes (Signed)
Pt's states no medical or surgical changes since previsit or office visit. 

## 2022-07-09 NOTE — Progress Notes (Signed)
Vss nad trans to pacu °

## 2022-07-09 NOTE — Op Note (Addendum)
[place] Patient Name: Angela Pugh Procedure Date: 07/09/2022 2:27 PM MRN: [id number] Endoscopist: Gerrit Heck , MD, [id number] Age: 77 Referring MD:  Date of Birth: [birth year] Gender: Female Account #: [id number] Procedure:                Upper GI endoscopy Indications:              Epigastric abdominal pain, Dysphagia, Heartburn,                            Esophageal reflux, Follow-up of Helicobacter                            pylori, Nausea, History of gastric intestinal                            metaplasia Medicines:                Monitored Anesthesia Care Procedure:                Pre-Anesthesia Assessment:                           - Prior to the procedure, a History and Physical                            was performed, and patient medications and                            allergies were reviewed. The patient's tolerance of                            previous anesthesia was also reviewed. The risks                            and benefits of the procedure and the sedation                            options and risks were discussed with the patient.                            All questions were answered, and informed consent                            was obtained. Prior Anticoagulants: The patient has                            taken no anticoagulant or antiplatelet agents. ASA                            Grade Assessment: III - A patient with severe                            systemic disease. After reviewing the risks and  benefits, the patient was deemed in satisfactory                            condition to undergo the procedure.                           After obtaining informed consent, the endoscope was                            passed under direct vision. Throughout the                            procedure, the patient's blood pressure, pulse, and                            oxygen saturations were monitored continuously.  The                            GIF [id number] was introduced through the                            mouth, and advanced to the second part of duodenum.                            The upper GI endoscopy was accomplished without                            difficulty. The patient tolerated the procedure                            well. Scope In: Scope Out: Findings:                 A single medium sized (~6 mm) vascular bleb was                            found in the lower third of the esophagus. This was                            located approximately 1-2 cm proximal to the                            Schatzki ring, and precluded mechanical dilation of                            the ring.                           A non-obstructing Schatzki ring was found in the                            lower third of the esophagus. Given the close                            proximity to  the vascular bleb, and given good                            response to previous forceps fracturing of the                            ring, decided to again fracture with forceps                            instead of mechanical dilation wth balloon or                            bougie dilator. This was fractured using cold                            forceps.                           The entire examined stomach was normal. Biopsies                            were taken with a cold forceps for Helicobacter                            pylori testing and evaluation for intestinal                            metaplasia. Biopsies were taken per GIM mapping                            protocol. Estimated blood loss was minimal.                           Localized mildly erythematous mucosa without active                            bleeding and with no stigmata of bleeding was found                            in the duodenal bulb. Biopsies were taken with a                            cold forceps for histology. Estimated  blood loss                            was minimal.                           The second portion of the duodenum was normal. Complications:            No immediate complications. Estimated Blood Loss:     Estimated blood loss was minimal. Impression:               - Vascular bleb found in the distal esophagus.                           -  Non-obstructing Schatzki ring. Fractured with                            forceps.                           - Normal stomach. Biopsied.                           - Erythematous duodenopathy. Biopsied.                           - Normal second portion of the duodenum. Recommendation:           - Patient has a contact number available for                            emergencies. The signs and symptoms of potential                            delayed complications were discussed with the                            patient. Return to normal activities tomorrow.                            Written discharge instructions were provided to the                            patient.                           - Soft diet today.                           - Continue present medications.                           - Await pathology results.                           - Repeat upper endoscopy PRN for retreatment.                           - Return to GI clinic at appointment to be                            scheduled.                           - If ongoing symptoms, may consider pH/Impedance                            testing and/or cross sectional imaging. Gerrit Heck, MD 07/09/2022 3:15:18 PM

## 2022-07-09 NOTE — Patient Instructions (Addendum)
Soft diet today.  See handout  Continue present medications. Await pathology results. Repeat upper endoscopy as needed for retreatment. Return to GI clinic at appointment to be scheduled.  This will be done after biopsy results are back If ongoing symptoms, may consider pH/Impedance testing and/or cross sectional imaging.    YOU HAD AN ENDOSCOPIC PROCEDURE TODAY AT [facility]:   Refer to the procedure report that was given to you for any specific questions about what was found during the examination.  If the procedure report does not answer your questions, please call your gastroenterologist to clarify.  If you requested that your care partner not be given the details of your procedure findings, then the procedure report has been included in a sealed envelope for you to review at your convenience later.  YOU SHOULD EXPECT: Some feelings of bloating in the abdomen. Passage of more gas than usual.  Walking can help get rid of the air that was put into your GI tract during the procedure and reduce the bloating. Please Note:  You might notice some irritation and congestion in your nose or some drainage.  This is from the oxygen used during your procedure.  There is no need for concern and it should clear up in a day or so.  SYMPTOMS TO REPORT IMMEDIATELY: Following upper endoscopy (EGD)  Vomiting of blood or coffee ground material  New chest pain or pain under the shoulder blades  Painful or persistently difficult swallowing  New shortness of breath  Fever of 100F or higher  Black, tarry-looking stools  For urgent or emergent issues, a gastroenterologist can be reached at any hour by calling [phone]. Do not use MyChart messaging for urgent concerns.    DIET:  Soft diet today!! Drink plenty of fluids but you should avoid alcoholic beverages for 24 hours.  ACTIVITY:  You should plan to take it easy for the rest of today and you should NOT DRIVE or use heavy machinery  until tomorrow (because of the sedation medicines used during the test).    FOLLOW UP: Our staff will call the number listed on your records the next business day following your procedure.  We will call around 7:15- 8:00 am to check on you and address any questions or concerns that you may have regarding the information given to you following your procedure. If we do not reach you, we will leave a message.     If any biopsies were taken you will be contacted by phone or by letter within the next 1-3 weeks.  Please call us at [phone] if you have not heard about the biopsies in 3 weeks.    SIGNATURES/CONFIDENTIALITY: You and/or your care partner have signed paperwork which will be entered into your electronic medical record.  These signatures attest to the fact that that the information above on your After Visit Summary has been reviewed and is understood.  Full responsibility of the confidentiality of this discharge information lies with you and/or your care-partner.

## 2022-07-10 ENCOUNTER — Telehealth: Payer: Self-pay | Admitting: *Deleted

## 2022-07-10 ENCOUNTER — Telehealth: Payer: Self-pay

## 2022-07-10 LAB — GI PROFILE, STOOL, PCR

## 2022-07-10 NOTE — Telephone Encounter (Signed)
Dr. Bryan Lemma, [place] B[place] is out of the office. Received call report from [place] with [facility]. Norovirus detected.

## 2022-07-10 NOTE — Telephone Encounter (Signed)
No answer for post procedure call back. Left VM. 

## 2022-07-10 NOTE — Telephone Encounter (Signed)
Pt returned call. We reviewed her stool study results. Pt has been advised to stay hydrated and follow a bland diet. Pt knows that we will be in touch once the remainder of her results return. Pt verbalized understanding and had no concerns at the end of the call.

## 2022-07-10 NOTE — Telephone Encounter (Signed)
Lm on home vm for patient to return call.  

## 2022-07-13 ENCOUNTER — Telehealth: Payer: Self-pay

## 2022-07-13 NOTE — Progress Notes (Signed)
Care Management & Coordination Services Pharmacy Team  Reason for Encounter: Appointment Reminder  Contacted patient to confirm telephone appointment with Burman Riis, PharmD on 07/14/2022 at 8:30. Unsuccessful outreach. Left voicemail for patient to return call.  Do you have any problems getting your medications?  If yes what types of problems are you experiencing?   What is your top health concern you would like to discuss at your upcoming visit?   Have you seen any other providers since your last visit with PCP?   Care Gaps: AWV - completed 02/10/2022,  scheduled 02/16/2023 Last eye exam / retinopathy screening:06/17/2022 Last diabetic foot exam:06/23/2022 Pneumovax - never done Urine ACR - never done Hep C Screen - never done Tdap - never done Shingrix - never done Covid - overdue HGA1C - overdue,  6.7 completed 11/10/2021  Star Rating Drugs: Rosuvastatin 20 mg - last filled 06/16/2022 84 DS at [facility]  Olmesartan 5 mg - last filled 07/02/2022 90 DS at [facility] [phone]

## 2022-07-14 ENCOUNTER — Ambulatory Visit: Payer: Medicare Other

## 2022-07-15 LAB — PANCREATIC ELASTASE, FECAL: Pancreatic Elastase-1, Stool: >500 mcg/g

## 2022-07-17 ENCOUNTER — Encounter: Payer: Self-pay | Admitting: Gastroenterology

## 2022-07-24 ENCOUNTER — Telehealth: Payer: Self-pay | Admitting: Gastroenterology

## 2022-07-24 NOTE — Telephone Encounter (Signed)
Patient is requesting to speak with a nurse in regards Lab results .Please advise

## 2022-07-27 NOTE — Telephone Encounter (Signed)
The pt called to complain of rectal bleeding, bloating and change in bowels over the past week.  She has tried levsin without relief.  She is on lialda 1.2 g 4 times daily.  She says she has a burning in the rectum as well.  She is not sure if she has hemorrhoids and states she is miserable. She had EGD on 07/09/22  Angela Bullion, DO 07/20/2022 12:12 PM EST Back to Top    The results of the upper endoscopy were reviewed and notable for the following:   -The biopsies taken from your stomach and small intestine were notable for gastritis and peptic duodenitis (inflammation), but there was no evidence of Helicobacter pylori infection.   The pt has asked to be seen as soon as possible. Appt given for 3/14 with [facility].

## 2022-07-28 NOTE — Progress Notes (Unsigned)
07/30/2022 Angela Pugh [id number] [birth year]  Referring provider: [name], MD Primary GI doctor: Dr. Bryan Lemma ( Dr. Ardis Hughs)  ASSESSMENT AND PLAN:   Worsening diarrhea, rectal bleeding, AB discomfort and bloating in patient with history of SCAD, recent norovirus, no fever, chills.  Colon 04/2020 luminal narrowing associated with diverticulosis. Possible from recent flora disruption, FODMAP given, switch levsin to dicyclomine Possible flare of SCAD versus SIBO, will treat with cipro/flagyl Follow up 6-8 weeks.   Rectal bleeding Had fissure on exam Send in diltiazem and nystatin for rash  Patient Care Team: [name], MD as PCP - General (Family Medicine) Bensimhon, Shaune Pascal, MD as PCP - Advanced Heart Failure (Cardiology) Lorretta Harp, MD as PCP - Cardiology (Cardiology) Levora Dredge, Theo Dills, [facility] (Pharmacist)  HISTORY OF PRESENT ILLNESS: 77 y.o. female with a past medical history of chronic GERD, and significant diverticular disease with probable associated SCAD for which she has been on Lialda, echo 12/2021 EF 55-60%, no AS, normal PFTs, cath 2019 normal coronary arteries, and others listed below presents for evaluation of indigestion.   04/2020 EGD with finding of a small blue bleb in the distal esophagus, mild gastritis, no H. pylori. Gastric biopsies showed focal intestinal metaplasia no dysplasia and no H. pylori. 04/2020 Colonoscopy at that same time with significant chronic diverticular changes multiple small and large diverticuli especially in the sigmoid colon with associated luminal narrowing and spasticity, no polyps. 02/03/2022 EGD for dysphagia and heartburn normal upper third of esophagus, blood found in esophagus, nonobstructive Schatzki ring treated with cold forceps for ring fracturing, mild nonulcer gastritis, single bleeding AVM in duodenum status post MR clips Pathology showed H. pylori gastritis and intestinal metaplasia without  dysplasia treated with quad therapy.  No eradication study. 06/29/2022 went to ER with nonspecific chest pain CBC without anemia or leukocytosis, negative troponin, normal kidney and liver, given Zofran, GI cocktail with improvement of symptoms. 02/12024 CT angio chest abdomen pelvis showed no evidence of aortic aneurysm, high-grade stenosis proximal right common carotid artery, mild coronary artery calcification, emphysema, 3 mm solid pulmonary nodule right middle lobe follow-up recommended, severe sigmoid diverticulosis without diverticulitis, greater than 50% stenosis of main renal arteries bilaterally changes of fibromuscular dysplasia, 50% stenosis celiac artery  07/06/2022 office visit with myself for worsening epigastric pain and dysphagia 07/07/2022 norovirus on GI pathogen panel, normal pancreatic elastase 07/09/2022 EGD with Dr. Bryan Lemma vascular bleb distal esophagus, nonobstructing Schatzki ring fractured with forceps, normal stomach, erythematous duodenum, normal second portion of duodenum.  Pathology showed gastritis and peptic duodenitis but negative for H. Pylori  07/24/2022 called complaining of rectal bleeding, bloating and change in bowel habits Levsin without relief and rectal burning  On Lialda 1.2 g 4 times   She was having several days of crescendo AB pain with diarrhea.  She tested positive for norovirus on the 20th and states she was eating bland food and was getting better, fecal incontinence had resolved. She was improving until about a week or 2 ago.   She was having severe AB pain and BRB in the stool with loose stools. She has had AB bloating.  She states she has 2-3 different Bm's in a day, this AM had small hard balls.  Has rash on her rectum after the diarrhea, has burning sensation with slight itching, vaseline did not help, polysporin helped.  No nausea, vomiting, GERD controlled.  No fever, chills.   She is on celebrex every other day, bASA every night.  No other  NSAIDS.  Rare ETOH, denies tobacco use, no drug use.   She  reports that she has been smoking cigarettes. She has a 10.00 pack-year smoking history. She has never used smokeless tobacco. She reports current alcohol use. She reports that she does not use drugs.  RELEVANT LABS AND IMAGING: CBC    Component Value Date/Time   WBC 5.8 07/06/2022 1412   RBC 4.35 07/06/2022 1412   HGB 14.0 07/06/2022 1412   HGB 14.5 03/08/2018 1612   HCT 41.3 07/06/2022 1412   HCT 43.0 03/08/2018 1612   PLT 324.0 07/06/2022 1412   PLT 287 03/08/2018 1612   MCV 95.0 07/06/2022 1412   MCV 91 03/08/2018 1612   MCH 31.5 06/29/2022 2210   MCHC 34.0 07/06/2022 1412   RDW 13.3 07/06/2022 1412   RDW 12.3 03/08/2018 1612   LYMPHSABS 2.6 07/06/2022 1412   LYMPHSABS 3.6 (H) 03/08/2018 1612   MONOABS 0.5 07/06/2022 1412   EOSABS 0.3 07/06/2022 1412   EOSABS 0.1 03/08/2018 1612   BASOSABS 0.1 07/06/2022 1412   BASOSABS 0.1 03/08/2018 1612   Recent Labs    11/10/21 1649 11/26/21 1022 12/01/21 2037 06/29/22 2210 07/06/22 1412  HGB 14.3 14.9 13.6 14.5 14.0     CMP     Component Value Date/Time   NA 138 07/06/2022 1412   NA 142 11/03/2018 0826   K 4.3 07/06/2022 1412   CL 102 07/06/2022 1412   CO2 28 07/06/2022 1412   GLUCOSE 136 (H) 07/06/2022 1412   BUN 15 07/06/2022 1412   BUN 13 11/03/2018 0826   CREATININE 1.13 07/06/2022 1412   CREATININE 0.99 (H) 01/12/2020 1131   CALCIUM 9.7 07/06/2022 1412   PROT 7.5 07/06/2022 1412   PROT 7.6 06/23/2022 1051   ALBUMIN 3.9 07/06/2022 1412   ALBUMIN 4.5 06/23/2022 1051   AST 10 07/06/2022 1412   ALT 8 07/06/2022 1412   ALKPHOS 62 07/06/2022 1412   BILITOT 0.3 07/06/2022 1412   BILITOT 0.3 06/23/2022 1051   GFRNONAA 53 (L) 06/29/2022 2210   GFRNONAA 57 (L) 01/12/2020 1131   GFRAA 66 01/12/2020 1131      Latest Ref Rng & Units 07/06/2022    2:12 PM 06/29/2022   10:10 PM 06/23/2022   10:51 AM  Hepatic Function  Total Protein 6.0 - 8.3 g/dL 7.5  7.7   7.6   Albumin 3.5 - 5.2 g/dL 3.9  3.7  4.5   AST 0 - 37 U/L '10  21  14   '$ ALT 0 - 35 U/L '8  16  9   '$ Alk Phosphatase 39 - 117 U/L 62  63  82   Total Bilirubin 0.2 - 1.2 mg/dL 0.3  0.7  0.3   Bilirubin, Direct 0.00 - 0.40 mg/dL   0.10       Current Medications:    Current Outpatient Medications (Cardiovascular):    amLODipine (NORVASC) 5 MG tablet, TAKE 1 TABLET(5 MG) BY MOUTH DAILY   olmesartan (BENICAR) 5 MG tablet, TAKE 2 TABLETS(10 MG) BY MOUTH DAILY   rosuvastatin (CRESTOR) 20 MG tablet, Take 1 tablet (20 mg total) by mouth daily.  Current Outpatient Medications (Respiratory):    fexofenadine (ALLEGRA ALLERGY) 180 MG tablet, Take 1 tablet (180 mg total) by mouth daily.  Current Outpatient Medications (Analgesics):    aspirin 81 MG EC tablet, Take 81 mg by mouth daily. Swallow whole.   celecoxib (CELEBREX) 200 MG capsule, Take 1 capsule (200 mg total) by  mouth daily.  Current Outpatient Medications (Hematological):    vitamin B-12 (CYANOCOBALAMIN) 1000 MCG tablet, Take 1,000 mcg by mouth daily.  Current Outpatient Medications (Other):    blood glucose meter kit and supplies KIT, Dispense based on patient and insurance preference. Use up to four times daily as directed.   Calcium Carb-Cholecalciferol (CALCIUM 1000 + D PO),    ciprofloxacin (CIPRO) 500 MG tablet, Take 1 tablet (500 mg total) by mouth 2 (two) times daily for 14 days.   clobetasol ointment (TEMOVATE) [id number] %, Apply 1 Application topically 2 (two) times daily.   hyoscyamine (LEVSIN SL) 0.125 MG SL tablet, Take 1 to 2 tablets under the tongue as needed for cramping and abdominal bloating.   mesalamine (LIALDA) 1.2 g EC tablet, TAKE 4 TABLETS(4.8 GRAMS) BY MOUTH DAILY WITH BREAKFAST   metroNIDAZOLE (FLAGYL) 500 MG tablet, Take 1 tablet (500 mg total) by mouth 2 (two) times daily for 14 days.   Multiple Vitamins-Minerals (CENTRUM ADULTS PO), Take 1 tablet by mouth daily.    NON FORMULARY, Diltiazem 2%/Lidocaine5%  compound Use 3 x rectally daily for 2 months to heal anal fissure   nystatin cream (MYCOSTATIN), Apply 1 Application topically 2 (two) times daily.   Omega-3 Fatty Acids (FISH OIL PO), 2,000 mg daily.   ondansetron (ZOFRAN) 4 MG tablet, Take 1 tablet (4 mg total) by mouth every 8 (eight) hours as needed for nausea or vomiting.   pantoprazole (PROTONIX) 40 MG tablet, Take 1 tablet (40 mg total) by mouth 2 (two) times daily before a meal.   potassium chloride SA (KLOR-CON M) 20 MEQ tablet, TAKE 1 TABLET(20 MEQ) BY MOUTH DAILY   sucralfate (CARAFATE) 1 g tablet, TAKE 1 TABLET(1 GRAM) BY MOUTH FOUR TIMES DAILY AT BEDTIME WITH MEALS   triamcinolone cream (KENALOG) 0.5 %, APPLY TOPICALLY TO THE AFFECTED AREA TWICE DAILY AS NEEDED FOR DRY SKIN  Medical History:  Past Medical History:  Diagnosis Date   Arthritis    back, knees, shoulder, hands , injections in pelvis, for bone spurs (05/04/2016)   Chronic back pain    Diverticulosis    History of kidney stones    HLD (hyperlipidemia)    Hypertension    Migraine    hx (05/04/2016)   Peripheral neuropathy    Radiculopathy    Sinus complaint    Sinus headache    occasional (05/04/2016)   Type II diabetes mellitus (HCC)    diet controlled, no meds now, was on insulin & po treatment at one time     Vertigo    Allergies:  Allergies  Allergen Reactions   Symbicort [Budesonide-Formoterol Fumarate] Shortness Of Breath, Swelling and Other (See Comments)    Pharyngeal edema, hoarseness, tightness in chest, lips became dark.   Acetaminophen Hives and Nausea Only   Azithromycin Hives and Nausea And Vomiting   Lipitor [Atorvastatin] Nausea Only    weakness   Zyrtec [Cetirizine]     Hives      Surgical History:  She  has a past surgical history that includes Anterior fusion cervical spine (2000); Lumbar laminectomy (Left, 06/30/2013); Anterior cervical decomp/discectomy fusion (N/A, 04/17/2016); Back surgery; Vaginal hysterectomy; Tubal ligation;  LEFT HEART CATH AND CORONARY ANGIOGRAPHY (N/A, 03/17/2018); and Colonoscopy (06/10/2016). Family History:  Her family history includes Diabetes Mellitus II in her brother; Diabetes type II in her sister; Diverticulitis in her daughter; Heart Problems in her daughter; SIDS in her son; Uterine cancer in her mother.  REVIEW OF SYSTEMS  : All  other systems reviewed and negative except where noted in the History of Present Illness.  PHYSICAL EXAM: BP 120/78   Pulse 60   Ht '5\' 7"'$  (1.702 m)   Wt 183 lb (83 kg)   BMI 28.66 kg/m  General Appearance: Well nourished, in no apparent distress. Head:   Normocephalic and atraumatic. Eyes:  sclerae anicteric,conjunctive pink  Respiratory: Respiratory effort normal, BS equal bilaterally without rales, rhonchi, wheezing. Cardio: RRR with no MRGs. Peripheral pulses intact.  Abdomen: Soft,  Non-distended ,active bowel sounds. mild tenderness in the lower abdomen. Without guarding and Without rebound. No masses. Rectal: inferior fissure noted, slight rash, normal rectal tone, no internal hemorrhoids appreciated, no masses, non tender, brown stool, hemoccult N/A Musculoskeletal: Full ROM, Normal gait. Without edema. Skin:  Dry and intact without significant lesions or rashes Neuro: Alert and  oriented x4;  No focal deficits. Psych:  Cooperative. Normal mood and affect.    Vladimir Crofts, PA-C 2:05 PM

## 2022-07-30 ENCOUNTER — Encounter: Payer: Self-pay | Admitting: Physician Assistant

## 2022-07-30 ENCOUNTER — Ambulatory Visit (INDEPENDENT_AMBULATORY_CARE_PROVIDER_SITE_OTHER): Payer: Medicare Other | Admitting: Physician Assistant

## 2022-07-30 VITALS — BP 120/78 | HR 60 | Ht 67.0 in | Wt 183.0 lb

## 2022-07-30 DIAGNOSIS — K5010 Crohn's disease of large intestine without complications: Secondary | ICD-10-CM | POA: Diagnosis not present

## 2022-07-30 DIAGNOSIS — R1084 Generalized abdominal pain: Secondary | ICD-10-CM | POA: Diagnosis not present

## 2022-07-30 DIAGNOSIS — R140 Abdominal distension (gaseous): Secondary | ICD-10-CM

## 2022-07-30 DIAGNOSIS — K625 Hemorrhage of anus and rectum: Secondary | ICD-10-CM

## 2022-07-30 DIAGNOSIS — K5730 Diverticulosis of large intestine without perforation or abscess without bleeding: Secondary | ICD-10-CM | POA: Diagnosis not present

## 2022-07-30 MED ORDER — CIPROFLOXACIN HCL 500 MG PO TABS: 500.0000 mg | ORAL_TABLET | Freq: Two times a day (BID) | ORAL | 0 refills | Status: AC

## 2022-07-30 MED ORDER — DICYCLOMINE HCL 20 MG PO TABS: 20.0000 mg | ORAL_TABLET | Freq: Three times a day (TID) | ORAL | 0 refills | Status: DC | PRN

## 2022-07-30 MED ORDER — METRONIDAZOLE 500 MG PO TABS: 500.0000 mg | ORAL_TABLET | Freq: Two times a day (BID) | ORAL | 0 refills | Status: AC

## 2022-07-30 MED ORDER — NYSTATIN 100000 UNIT/GM EX CREA: 1.0000 | TOPICAL_CREAM | Freq: Two times a day (BID) | CUTANEOUS | 1 refills | Status: DC

## 2022-07-30 MED ORDER — NON FORMULARY: 1 refills | Status: AC

## 2022-07-30 NOTE — Patient Instructions (Addendum)
_______________________________________________________  If your blood pressure at your visit was 140/90 or greater, please contact your primary care physician to follow up on this.  _______________________________________________________  If you are age 76 or older, your body mass index should be between 23-30. Your Body mass index is 28.66 kg/m. If this is out of the aforementioned range listed, please consider follow up with your Primary Care Provider.  If you are age 25 or younger, your body mass index should be between 19-25. Your Body mass index is 28.66 kg/m. If this is out of the aformentioned range listed, please consider follow up with your Primary Care Provider.   ________________________________________________________  The [facility] providers would like to encourage you to use [facility] to communicate with providers for non-urgent requests or questions.  Due to long hold times on the telephone, sending your provider a message by [facility] may be a faster and more efficient way to get a response.  Please allow 48 business hours for a response.  Please remember that this is for non-urgent requests.  _______________________________________________________   Adriana Reams, Adult  Diltiazem/lidocaine 3 x daily for 2 months sent to compound pharmacy  NTG 1.25% use gloved hand, apply pea size amount every 6-8 hours for pain rectally, #30 gram no refills.    Sent this medication to a compound pharmacy:  [facility] [address], [place], [place] [address] [phone]  Please DO NOT go directly from our office to pick up this medication! Give the pharmacy 1 day to process the prescription. Extra time is required for them to compound your medication.  Follow up should symptoms persist for secondary evaluation and possible surgical referral for repair.  We may want to evaluate you for small intestinal bacterial overgrowth, this can cause increase gas, bloating,  loose stools or constipation.  There is a test for this we can do or sometimes we will treat a patient with an antibiotic to see if it helps.    Patient with history of segmental colitis associated with diverticulitis with associated Will treat with antibiotic, continue lialda   I recommend that the patient abstain from all alcohol while taking metronidazole.   To avoid side effects of metronidazole, I recommend that she stick to simple meals and not eat rich or spicy food.  She should always try to take your metronidazole after a meal or snack. Most common side effects include nausea, vomiting, diarrhea, stomach upset and rash.   It was a pleasure to see you today!  Thank you for trusting me with your gastrointestinal care!

## 2022-08-03 ENCOUNTER — Ambulatory Visit (INDEPENDENT_AMBULATORY_CARE_PROVIDER_SITE_OTHER): Payer: Medicare Other | Admitting: Physical Medicine and Rehabilitation

## 2022-08-03 ENCOUNTER — Encounter: Payer: Self-pay | Admitting: Physical Medicine and Rehabilitation

## 2022-08-03 DIAGNOSIS — M47816 Spondylosis without myelopathy or radiculopathy, lumbar region: Secondary | ICD-10-CM | POA: Diagnosis not present

## 2022-08-03 DIAGNOSIS — M48062 Spinal stenosis, lumbar region with neurogenic claudication: Secondary | ICD-10-CM

## 2022-08-03 DIAGNOSIS — M5416 Radiculopathy, lumbar region: Secondary | ICD-10-CM

## 2022-08-03 NOTE — Progress Notes (Signed)
Functional Pain Scale - descriptive words and definitions  Distressing (6)    Pain is present/unable to complete most ADLs limited by pain/sleep is difficult and active distraction is only marginal. Moderate range order  Average Pain 8  Right hip pain that radiates down the leg. Pain down the right leg when lifting left arm

## 2022-08-03 NOTE — Progress Notes (Signed)
Angela Pugh - 77 y.o. female MRN [id number]  Date of birth: September 01, 1945  Office Visit Note: Visit Date: 08/03/2022 PCP: [name], MD Referred by: [name], MD  Subjective: Chief Complaint  Patient presents with   Lower Back - Pain   HPI: Angela Pugh is a 77 y.o. female who comes in today for evaluation of chronic, worsening and severe bilateral lower back pain radiating to buttocks, lateral hips and down to knees. Pain ongoing for over a year, worsened 1 week ago. Describes pain as sore and aching, currently rates as 8 out of 10. Reports intermittent tingling sensation to legs. Pain becomes severe with prolonged standing and walking. Some relief of pain with home exercise regimen, rest and use of medications. History of formal physical therapy with our in house team in 2023, some relief of pain with these treatments. Does take Tylenol #3 previously prescribed by Dr. Basil Dess. Lumbar MRI imaging from 2023 exhibits grade 1 anterolisthesis of L4 on L5, there is also advanced facet arthropathy at this level leading to moderate spinal canal stenosis and severe bilateral subarticular narrowing. History of multiple lumbar surgeries, most recent being left L3-L4 extra foraminal discectomy by Dr. Basil Dess in 2015. Patient underwent left L4 transforaminal epidural steroid injection in our office in February of 2023, she reports significant and sustained relief of pain with this procedure until recently. Her pain now is more bilateral. Patient spoke with Dr. Louanne Skye before he retired and is set up to be evaluated by Dr. Ileene Rubens on 08/27/2022. Patient denies focal weakness. No recent trauma or falls.    Oswestry Disability Index Score 58% 20 to 30 (60%) severe disability: Pain remains the main problem in this group but activities of daily living are affected. These patients require a detailed investigation.  Review of Systems  Musculoskeletal:  Positive for back pain.   Neurological:  Positive for tingling. Negative for focal weakness and weakness.  All other systems reviewed and are negative.  Otherwise per HPI.  Assessment & Plan: Visit Diagnoses:    ICD-10-CM   1. Lumbar radiculopathy  M54.16 Ambulatory referral to Physical Medicine Rehab    2. Spinal stenosis of lumbar region with neurogenic claudication  M48.062 Ambulatory referral to Physical Medicine Rehab    3. Facet arthropathy, lumbar  M47.816 Ambulatory referral to Physical Medicine Rehab       Plan: Findings:  Chronic, worsening and severe bilateral lower back pain radiating to buttocks, lateral hips and down to knees. Patient continues to have severe pain despite good conservative therapies such as formal physical therapy, home exercise regimen, rest and use of medications. Patients clinical presentation and exam are consistent with neurogenic claudication as a result of spinal canal stenosis. There is moderate to severe spinal canal stenosis at the level of L4-L5. Next step is to perform diagnostic and hopefully therapeutic bilateral L4 transforaminal epidural steroid injection under fluoroscopic guidance. If good relief of pain with injection we can repeat this procedure infrequently as needed. I did discuss injection procedure with her today in detail. Patient is scheduled to see Dr. Laurance Flatten in the coming weeks. I think this will be beneficial as she will be able to establish care and discuss possible surgical interventions. No red flag symptoms noted upon exam today.     Meds & Orders: No orders of the defined types were placed in this encounter.   Orders Placed This Encounter  Procedures   Ambulatory referral to Physical Medicine Rehab  Follow-up: Return for Bilateral L4 transforaminal epidural steroid injection.   Procedures: No procedures performed      Clinical History: MRI LUMBAR SPINE WITHOUT CONTRAST   TECHNIQUE: Multiplanar, multisequence MR imaging of the lumbar spine  was performed. No intravenous contrast was administered.   COMPARISON:  CT myelogram September 13, 2019.   FINDINGS: Segmentation:  Standard.   Alignment: Levoconvex scoliosis. Grade 1 anterolisthesis of L4 over L5 related to facet arthropathy.   Vertebrae:  No fracture, evidence of discitis, or bone lesion.   Conus medullaris and cauda equina: Conus extends to the L2 level. Conus and cauda equina appear normal.   Paraspinal and other soft tissues: Negative.   Disc levels:   T11-12: Only evaluated on sagittal views. Loss of disc height, disc bulge resulting in mild left neural foraminal narrowing. No significant spinal canal stenosis.   T12-L1: No spinal canal or neural foraminal stenosis.   L1-2: Shallow disc bulge and mild facet degenerative changes without significant spinal canal or neural foraminal stenosis.   L2-3: Shallow disc bulge and mild facet degenerative changes without significant spinal canal or neural foraminal stenosis.   L3-4: Mild loss of disc height, disc bulge, moderate facet degenerative changes and ligamentum flavum redundancy resulting in mild spinal canal stenosis with mild narrowing of the bilateral subarticular zones, moderate bilateral neural foraminal narrowing.   L4-5: Disc bulge/disc uncovering, prominent hypertrophic facet degenerative change with mild joint effusion and ligamentum flavum redundancy resulting in moderate spinal canal stenosis with severe narrowing of the bilateral subarticular zones, moderate right and mild left neural foraminal narrowing.   L5-S1: Loss of disc height, disc bulge with associated osteophytic component and superimposed small central disc protrusion, moderate facet degenerative changes. Findings result in mild bilateral neural foraminal narrowing. No significant spinal canal stenosis.   IMPRESSION: 1. Degenerative changes of the lumbar spine, more pronounced at L4-5 where there is anterolisthesis related to  severe facet arthropathy resulting in moderate spinal canal stenosis with narrowing of the bilateral subarticular zone and moderate right neural foraminal narrowing. Findings likely result in impingement of the bilateral L5 and right L4 nerve roots. 2. Moderate bilateral neural foraminal narrowing at L3-4.     Electronically Signed   By: Pedro Earls M.D.   On: 06/06/2021 09:50   She reports that she has been smoking cigarettes. She has a 10.00 pack-year smoking history. She has never used smokeless tobacco.  Recent Labs    11/10/21 1649  HGBA1C 6.7*    Objective:  VS:  HT:    WT:   BMI:     BP:   HR: bpm  TEMP: ( )  RESP:  Physical Exam Vitals and nursing note reviewed.  HENT:     Head: Normocephalic and atraumatic.     Right Ear: External ear normal.     Left Ear: External ear normal.     Nose: Nose normal.     Mouth/Throat:     Mouth: Mucous membranes are moist.  Eyes:     Extraocular Movements: Extraocular movements intact.  Cardiovascular:     Rate and Rhythm: Normal rate.     Pulses: Normal pulses.  Pulmonary:     Effort: Pulmonary effort is normal.  Abdominal:     General: Abdomen is flat. There is no distension.  Musculoskeletal:        General: Tenderness present.     Cervical back: Normal range of motion.     Comments: Patient rises from seated position  to standing without difficulty. Good lumbar range of motion. No pain noted with facet loading. 5/5 strength noted with bilateral hip flexion, knee flexion/extension, ankle dorsiflexion/plantarflexion and EHL. No clonus noted bilaterally. No pain upon palpation of greater trochanters. No pain with internal/external rotation of bilateral hips. Sensation intact bilaterally. Ambulates without aid, gait steady.     Skin:    General: Skin is warm and dry.     Capillary Refill: Capillary refill takes less than 2 seconds.  Neurological:     General: No focal deficit present.     Mental Status:  She is alert and oriented to person, place, and time.  Psychiatric:        Mood and Affect: Mood normal.        Behavior: Behavior normal.     Ortho Exam  Imaging: No results found.  Past Medical/Family/Surgical/Social History: Medications & Allergies reviewed per EMR, new medications updated. Patient Active Problem List   Diagnosis Date Noted   Headache above the eye region 02/19/2021   Nasal congestion 02/19/2021   Emphysema lung ([place]) 10/04/2020   Abdominal pain, epigastric 03/18/2020   Low back pain without sciatica 10/30/2019   Body mass index (BMI) 26.0-26.9, adult 10/23/2019   Nonischemic cardiomyopathy ([place]) 04/12/2018   Chest pain 01/25/2018   Left bundle branch block 01/25/2018   Carotid artery disease ([place]) 01/25/2018   Seasonal allergies 10/18/2017   Arthritis 10/18/2017   Pre-diabetes 10/18/2017   Protein-calorie malnutrition, severe 05/05/2016   Dysphagia 05/04/2016   HNP (herniated nucleus pulposus) with myelopathy, cervical 04/17/2016   Carpal tunnel syndrome 02/10/2016   Cervical spondylosis with radiculopathy 02/10/2016   Abdominal pain 02/01/2016   Acute diverticulitis 02/01/2016   HLD (hyperlipidemia) 02/01/2016   Diabetes mellitus without complication ([place]) [id number]   Hypokalemia 02/01/2016   Hypertension    Cervical spondylosis without myelopathy 01/13/2016   Bilateral carpal tunnel syndrome 01/13/2016   Herniated lumbar intervertebral disc 06/30/2013    Class: Acute   Past Medical History:  Diagnosis Date   Arthritis    back, knees, shoulder, hands , injections in pelvis, for bone spurs (05/04/2016)   Chronic back pain    Diverticulosis    History of kidney stones    HLD (hyperlipidemia)    Hypertension    Migraine    hx (05/04/2016)   Peripheral neuropathy    Radiculopathy    Sinus complaint    Sinus headache    occasional (05/04/2016)   Type II diabetes mellitus (HCC)    diet controlled, no meds now, was on insulin & po treatment  at one time     Vertigo    Family History  Problem Relation Age of Onset   Uterine cancer Mother    Diabetes type II Sister    Diabetes Mellitus II Brother    Diverticulitis Daughter    Heart Problems Daughter    SIDS Son    Colon cancer Neg Hx    Pancreatic cancer Neg Hx    Esophageal cancer Neg Hx    Colon polyps Neg Hx    Rectal cancer Neg Hx    Stomach cancer Neg Hx    Past Surgical History:  Procedure Laterality Date   ANTERIOR CERVICAL DECOMP/DISCECTOMY FUSION N/A 04/17/2016   Procedure: ANTERIOR CERVICAL DECOMPRESSION/DISCECTOMY FUSION CERVICAL THREE - CERVICAL FOUR, POSTERIOR CERVICAL DISCECTOMY CERVICAL SEVEN -THORASIC ONE LEFT;  Surgeon: Ashok Pall, MD;  Location: [facility];  Service: Neurosurgery;  Laterality: N/A;  Anterior/Posterior   ANTERIOR FUSION CERVICAL SPINE  2000   Dr. Carloyn Manner; "for bone spurs; put hardware in too"   BACK SURGERY     COLONOSCOPY  06/10/2016   [facility]   LEFT HEART CATH AND CORONARY ANGIOGRAPHY N/A 03/17/2018   Procedure: LEFT HEART CATH AND CORONARY ANGIOGRAPHY;  Surgeon: Lorretta Harp, MD;  Location: [facility];  Service: Cardiovascular;  Laterality: N/A;   LUMBAR LAMINECTOMY Left 06/30/2013   Procedure: Left L3-4 Extraforaminal approach to excise far lateral herniated nucleus pulposus;  Surgeon: Jessy Oto, MD;  Location: [facility];  Service: Orthopedics;  Laterality: Left;   TUBAL LIGATION     VAGINAL HYSTERECTOMY     Social History   Occupational History   Occupation: retired  Tobacco Use   Smoking status: Some Days    Packs/day: 0.50    Years: 20.00    Additional pack years: 0.00    Total pack years: 10.00    Types: Cigarettes    Last attempt to quit: 06/30/1982    Years since quitting: 40.1   Smokeless tobacco: Never   Tobacco comments:    Patient only smokes in social setting when drinking. Admits former h/o heavy smoking years ago smoked 1.5 packs/day.    12/04/2021 Patient states she has not spoke for about 3 weeks   Vaping Use   Vaping Use: Never used  Substance and Sexual Activity   Alcohol use: Yes    Comment: special occasion   Drug use: No   Sexual activity: Not Currently

## 2022-08-07 ENCOUNTER — Other Ambulatory Visit: Payer: Self-pay | Admitting: Physical Medicine and Rehabilitation

## 2022-08-07 ENCOUNTER — Telehealth: Payer: Self-pay | Admitting: Physical Medicine and Rehabilitation

## 2022-08-07 MED ORDER — TRAMADOL HCL 50 MG PO TABS: 50.0000 mg | ORAL_TABLET | Freq: Three times a day (TID) | ORAL | 0 refills | Status: DC | PRN

## 2022-08-07 NOTE — Telephone Encounter (Signed)
Patient called advised she was suppose to get something called in for pain to her pharmacy and nothing has been called in yet. The number to contact patient is [phone]

## 2022-08-10 NOTE — Progress Notes (Signed)
Agree with the assessment and plan as outlined by Amanda Collier, PA-C. ? ?Lutie Pickler, DO, FACG ? ?

## 2022-08-20 ENCOUNTER — Ambulatory Visit (INDEPENDENT_AMBULATORY_CARE_PROVIDER_SITE_OTHER): Payer: Medicare Other | Admitting: Physical Medicine and Rehabilitation

## 2022-08-20 ENCOUNTER — Other Ambulatory Visit: Payer: Self-pay

## 2022-08-20 VITALS — BP 182/78 | HR 59

## 2022-08-20 DIAGNOSIS — M5416 Radiculopathy, lumbar region: Secondary | ICD-10-CM | POA: Diagnosis not present

## 2022-08-20 MED ORDER — METHYLPREDNISOLONE ACETATE 80 MG/ML IJ SUSP
80.0000 mg | Freq: Once | INTRAMUSCULAR | Status: AC
  Administered 2022-08-20: 80 mg

## 2022-08-20 NOTE — Progress Notes (Signed)
Functional Pain Scale - descriptive words and definitions  Unmanageable (7)  Pain interferes with normal ADL's/nothing seems to help/sleep is very difficult/active distractions are very difficult to concentrate on. Severe range order  Average Pain 9   +Driver, -BT, -Dye Allergies.  Lower back pain on both sides that radiates into both legs, worse on right side

## 2022-08-20 NOTE — Patient Instructions (Signed)

## 2022-08-27 ENCOUNTER — Ambulatory Visit (INDEPENDENT_AMBULATORY_CARE_PROVIDER_SITE_OTHER): Payer: Medicare Other

## 2022-08-27 ENCOUNTER — Encounter: Payer: Self-pay | Admitting: Orthopedic Surgery

## 2022-08-27 ENCOUNTER — Ambulatory Visit (INDEPENDENT_AMBULATORY_CARE_PROVIDER_SITE_OTHER): Payer: Medicare Other | Admitting: Orthopedic Surgery

## 2022-08-27 VITALS — BP 164/78 | HR 65 | Ht 67.0 in | Wt 183.0 lb

## 2022-08-27 DIAGNOSIS — M5416 Radiculopathy, lumbar region: Secondary | ICD-10-CM

## 2022-08-27 DIAGNOSIS — M48062 Spinal stenosis, lumbar region with neurogenic claudication: Secondary | ICD-10-CM

## 2022-08-27 DIAGNOSIS — M47816 Spondylosis without myelopathy or radiculopathy, lumbar region: Secondary | ICD-10-CM

## 2022-08-27 DIAGNOSIS — M4316 Spondylolisthesis, lumbar region: Secondary | ICD-10-CM | POA: Diagnosis not present

## 2022-08-27 NOTE — Progress Notes (Signed)
Orthopedic Spine Surgery Office Note  Assessment: Patient is a 77 y.o. female with low back pain that radiates into her bilateral buttocks and right lateral thigh.  Has central and foraminal stenosis at L3/4 and L4/5 with a spondylolisthesis at L4/5   Plan: -Patient has tried physical therapy, Motrin, steroid injections -Discussed L3-5 laminectomy and PSIF as a treatment option for her.  She feels that she is getting good relief for several months with injections and wanted continue with our -Would need a DEXA scan prior to surgery if she elects to undergo surgery in the future -Patient should return to office as on an as-needed basis.  If she returns, XRs needed: lateral and AP lumbar that includes the hips  Patient expressed understanding of the plan and all questions were answered to the patient's satisfaction.   ___________________________________________________________________________   History:  Patient is a 77 y.o. female who presents today for lumbar spine.  Patient has had several years of low back pain that radiates into her bilateral buttocks and right lower extremity.  She feels it goes from the low back into the right buttock and right lateral thigh.  She also has left buttock pain but is not severe.  Pain does not radiate past the knees.  There is no trauma or injury that preceded the onset of pain.  Pain is getting progressively worse with time.  She has found good relief with periodic steroid injections.  She said this they last for several months.  Her last one was on 4//2024 and she feels her pain is tolerable at this point.  Denies paresthesias and numbness.   Weakness: Yes, sometimes her right thigh feels weaker.  No other weakness noted Symptoms of imbalance: Denies Paresthesias and numbness: Denies Bowel or bladder incontinence: Denies Saddle anesthesia: Denies  Treatments tried: physical therapy, Motrin, steroid injections  Review of systems: Denies fevers and  chills, night sweats, unexplained weight loss, history of cancer, pain that wakes them at night  Past medical history: Chronic low back pain Diverticulosis HTN Migraines HLD DM (diet controlled, last A1c was 6.7 in 05/2022) Vertigo  Allergies: symbicort, tylenol, azithromycin, lipitor, zyrtec  Past surgical history:  C3-7 ACDF Hysterectomy Left L3/4 far lateral discectomy  Social history: Former use of nicotine product (smoking, vaping, patches, smokeless) Alcohol use: rare Denies recreational drug use   Physical Exam:  BMI of 28.7  General: no acute distress, appears stated age Neurologic: alert, answering questions appropriately, following commands Respiratory: unlabored breathing on room air, symmetric chest rise Psychiatric: appropriate affect, normal cadence to speech   MSK (spine):  -Strength exam      Left  Right EHL    5/5  5/5 TA    5/5  5/5 GSC    5/5  5/5 Knee extension  5/5  5/5 Hip flexion   5/5  4+/5  -Sensory exam    Sensation intact to light touch in L3-S1 nerve distributions of bilateral lower extremities  -Achilles DTR: 2/4 on the left, 2/4 on the right -Patellar tendon DTR: 2/4 on the left, 2/4 on the right  -Straight leg raise: Negative bilaterally -Femoral nerve stretch test: Negative bilaterally -Clonus: no beats bilaterally  -Left hip exam: No pain through range of motion, negative Stinchfield, negative FABER -Right hip exam: No pain through range of motion, negative Stinchfield, negative FABER  Imaging: XR of the lumbar spine from 08/27/2022 was independently reviewed and interpreted, showing disc height loss at L3/4, L4/5, and L5/S1. Spondylolisthesis at L4/5 that shifts  about 1.6mm between flexion and extension views. PI of 51, LL of 49.   MRI of the lumbar spine from 06/05/2021 was independently reviewed and interpreted, showing central stenosis at L3/4, central and lateral recess stenosis at L4/5. Spondylolisthesis at L4/5. T2  signal within the right facet joint at L4/5 measures 1.109mm. DDD at L3/4, L4/5, L5/S1. Foraminal stenosis on the right at L4/5. Bilateral foraminal stenosis at L3/4.    Patient name: Angela Pugh Patient MRN: [id number] Date of visit: 08/27/22

## 2022-08-28 ENCOUNTER — Telehealth: Payer: Self-pay | Admitting: Family Medicine

## 2022-08-28 NOTE — Telephone Encounter (Signed)
Pt Angela Pugh spoke with the triage nurse and was recommended to be seen in the ER for her back pain symptoms. She refused to go to the ER per triage and said she was going to call back on Monday to request an appointment. Per triage pt was upset and stated she "felt as though she was being passed off when she had just called to make and appointment."

## 2022-08-28 NOTE — Telephone Encounter (Signed)
Ok to schedule appt

## 2022-08-31 NOTE — Procedures (Signed)
Lumbosacral Transforaminal Epidural Steroid Injection - Sub-Pedicular Approach with Fluoroscopic Guidance  Patient: Angela Pugh      Date of Birth: June 03, 1945 MRN: [id number] PCP: [name], MD      Visit Date: 08/20/2022   Universal Protocol:    Date/Time: 08/20/2022  Consent Given By: the patient  Position: PRONE  Additional Comments: Vital signs were monitored before and after the procedure. Patient was prepped and draped in the usual sterile fashion. The correct patient, procedure, and site was verified.   Injection Procedure Details:   Procedure diagnoses: Lumbar radiculopathy [M54.16]    Meds Administered:  Meds ordered this encounter  Medications   methylPREDNISolone acetate (DEPO-MEDROL) injection 80 mg    Laterality: Bilateral  Location/Site: L4  Needle:5.0 in., 22 ga.  Short bevel or Quincke spinal needle  Needle Placement: Transforaminal  Findings:    -Comments: Excellent flow of contrast along the nerve, nerve root and into the epidural space.  Procedure Details: After squaring off the end-plates to get a true AP view, the C-arm was positioned so that an oblique view of the foramen as noted above was visualized. The target area is just inferior to the "nose of the scotty dog" or sub pedicular. The soft tissues overlying this structure were infiltrated with 2-3 ml. of 1% Lidocaine without Epinephrine.  The spinal needle was inserted toward the target using a "trajectory" view along the fluoroscope beam.  Under AP and lateral visualization, the needle was advanced so it did not puncture dura and was located close the 6 O'Clock position of the pedical in AP tracterory. Biplanar projections were used to confirm position. Aspiration was confirmed to be negative for CSF and/or blood. A 1-2 ml. volume of Isovue-250 was injected and flow of contrast was noted at each level. Radiographs were obtained for documentation purposes.   After attaining the  desired flow of contrast documented above, a 0.5 to 1.0 ml test dose of 0.25% Marcaine was injected into each respective transforaminal space.  The patient was observed for 90 seconds post injection.  After no sensory deficits were reported, and normal lower extremity motor function was noted,   the above injectate was administered so that equal amounts of the injectate were placed at each foramen (level) into the transforaminal epidural space.   Additional Comments:  No complications occurred Dressing: 2 x 2 sterile gauze and Band-Aid    Post-procedure details: Patient was observed during the procedure. Post-procedure instructions were reviewed.  Patient left the clinic in stable condition.

## 2022-08-31 NOTE — Progress Notes (Signed)
Angela Pugh - 77 y.o. female MRN [id number]  Date of birth: 1945-06-16  Office Visit Note: Visit Date: 08/20/2022 PCP: [name], MD Referred by: [name], MD  Subjective: Chief Complaint  Patient presents with   Lower Back - Pain   HPI:  [name] is a 77 y.o. female who comes in today at the request of Dr. Willia Craze for planned Bilateral L4-5 Lumbar Transforaminal epidural steroid injection with fluoroscopic guidance.  The patient has failed conservative care including home exercise, medications, time and activity modification.  This injection will be diagnostic and hopefully therapeutic.  Please see requesting physician notes for further details and justification.   ROS Otherwise per HPI.  Assessment & Plan: Visit Diagnoses:    ICD-10-CM   1. Lumbar radiculopathy  M54.16 XR C-ARM NO REPORT    Epidural Steroid injection    methylPREDNISolone acetate (DEPO-MEDROL) injection 80 mg      Plan: No additional findings.   Meds & Orders:  Meds ordered this encounter  Medications   methylPREDNISolone acetate (DEPO-MEDROL) injection 80 mg    Orders Placed This Encounter  Procedures   XR C-ARM NO REPORT   Epidural Steroid injection    Follow-up: Return for visit to requesting provider as needed.   Procedures: No procedures performed  Lumbosacral Transforaminal Epidural Steroid Injection - Sub-Pedicular Approach with Fluoroscopic Guidance  Patient: Angela Pugh      Date of Birth: 1945/12/08 MRN: [id number] PCP: [name], MD      Visit Date: 08/20/2022   Universal Protocol:    Date/Time: 08/20/2022  Consent Given By: the patient  Position: PRONE  Additional Comments: Vital signs were monitored before and after the procedure. Patient was prepped and draped in the usual sterile fashion. The correct patient, procedure, and site was verified.   Injection Procedure Details:   Procedure diagnoses: Lumbar radiculopathy  [M54.16]    Meds Administered:  Meds ordered this encounter  Medications   methylPREDNISolone acetate (DEPO-MEDROL) injection 80 mg    Laterality: Bilateral  Location/Site: L4  Needle:5.0 in., 22 ga.  Short bevel or Quincke spinal needle  Needle Placement: Transforaminal  Findings:    -Comments: Excellent flow of contrast along the nerve, nerve root and into the epidural space.  Procedure Details: After squaring off the end-plates to get a true AP view, the C-arm was positioned so that an oblique view of the foramen as noted above was visualized. The target area is just inferior to the "nose of the scotty dog" or sub pedicular. The soft tissues overlying this structure were infiltrated with 2-3 ml. of 1% Lidocaine without Epinephrine.  The spinal needle was inserted toward the target using a "trajectory" view along the fluoroscope beam.  Under AP and lateral visualization, the needle was advanced so it did not puncture dura and was located close the 6 O'Clock position of the pedical in AP tracterory. Biplanar projections were used to confirm position. Aspiration was confirmed to be negative for CSF and/or blood. A 1-2 ml. volume of Isovue-250 was injected and flow of contrast was noted at each level. Radiographs were obtained for documentation purposes.   After attaining the desired flow of contrast documented above, a 0.5 to 1.0 ml test dose of 0.25% Marcaine was injected into each respective transforaminal space.  The patient was observed for 90 seconds post injection.  After no sensory deficits were reported, and normal lower extremity motor function was noted,   the above injectate  was administered so that equal amounts of the injectate were placed at each foramen (level) into the transforaminal epidural space.   Additional Comments:  No complications occurred Dressing: 2 x 2 sterile gauze and Band-Aid    Post-procedure details: Patient was observed during the  procedure. Post-procedure instructions were reviewed.  Patient left the clinic in stable condition.    Clinical History: MRI LUMBAR SPINE WITHOUT CONTRAST   TECHNIQUE: Multiplanar, multisequence MR imaging of the lumbar spine was performed. No intravenous contrast was administered.   COMPARISON:  CT myelogram September 13, 2019.   FINDINGS: Segmentation:  Standard.   Alignment: Levoconvex scoliosis. Grade 1 anterolisthesis of L4 over L5 related to facet arthropathy.   Vertebrae:  No fracture, evidence of discitis, or bone lesion.   Conus medullaris and cauda equina: Conus extends to the L2 level. Conus and cauda equina appear normal.   Paraspinal and other soft tissues: Negative.   Disc levels:   T11-12: Only evaluated on sagittal views. Loss of disc height, disc bulge resulting in mild left neural foraminal narrowing. No significant spinal canal stenosis.   T12-L1: No spinal canal or neural foraminal stenosis.   L1-2: Shallow disc bulge and mild facet degenerative changes without significant spinal canal or neural foraminal stenosis.   L2-3: Shallow disc bulge and mild facet degenerative changes without significant spinal canal or neural foraminal stenosis.   L3-4: Mild loss of disc height, disc bulge, moderate facet degenerative changes and ligamentum flavum redundancy resulting in mild spinal canal stenosis with mild narrowing of the bilateral subarticular zones, moderate bilateral neural foraminal narrowing.   L4-5: Disc bulge/disc uncovering, prominent hypertrophic facet degenerative change with mild joint effusion and ligamentum flavum redundancy resulting in moderate spinal canal stenosis with severe narrowing of the bilateral subarticular zones, moderate right and mild left neural foraminal narrowing.   L5-S1: Loss of disc height, disc bulge with associated osteophytic component and superimposed small central disc protrusion, moderate facet degenerative  changes. Findings result in mild bilateral neural foraminal narrowing. No significant spinal canal stenosis.   IMPRESSION: 1. Degenerative changes of the lumbar spine, more pronounced at L4-5 where there is anterolisthesis related to severe facet arthropathy resulting in moderate spinal canal stenosis with narrowing of the bilateral subarticular zone and moderate right neural foraminal narrowing. Findings likely result in impingement of the bilateral L5 and right L4 nerve roots. 2. Moderate bilateral neural foraminal narrowing at L3-4.     Electronically Signed   By: Baldemar Lenis M.D.   On: 06/06/2021 09:50     Objective:  VS:  HT:    WT:   BMI:     BP:(!) 182/78  HR:(!) 59bpm  TEMP: ( )  RESP:  Physical Exam Vitals and nursing note reviewed.  Constitutional:      General: She is not in acute distress.    Appearance: Normal appearance. She is not ill-appearing.  HENT:     Head: Normocephalic and atraumatic.     Right Ear: External ear normal.     Left Ear: External ear normal.  Eyes:     Extraocular Movements: Extraocular movements intact.  Cardiovascular:     Rate and Rhythm: Normal rate.     Pulses: Normal pulses.  Pulmonary:     Effort: Pulmonary effort is normal. No respiratory distress.  Abdominal:     General: There is no distension.     Palpations: Abdomen is soft.  Musculoskeletal:        General: Tenderness present.  Cervical back: Neck supple.     Right lower leg: No edema.     Left lower leg: No edema.     Comments: Patient has good distal strength with no pain over the greater trochanters.  No clonus or focal weakness.  Skin:    Findings: No erythema, lesion or rash.  Neurological:     General: No focal deficit present.     Mental Status: She is alert and oriented to person, place, and time.     Sensory: No sensory deficit.     Motor: No weakness or abnormal muscle tone.     Coordination: Coordination normal.  Psychiatric:         Mood and Affect: Mood normal.        Behavior: Behavior normal.      Imaging: No results found.

## 2022-08-31 NOTE — Telephone Encounter (Signed)
Called pt to schedule appointment, no answer. Left VM informing pt to call office to be scheduled.

## 2022-09-02 ENCOUNTER — Ambulatory Visit (INDEPENDENT_AMBULATORY_CARE_PROVIDER_SITE_OTHER): Payer: Medicare Other | Admitting: Family Medicine

## 2022-09-02 ENCOUNTER — Encounter: Payer: Self-pay | Admitting: Family Medicine

## 2022-09-02 VITALS — BP 160/88 | HR 60 | Temp 98.4°F | Wt 178.0 lb

## 2022-09-02 DIAGNOSIS — K2970 Gastritis, unspecified, without bleeding: Secondary | ICD-10-CM

## 2022-09-02 DIAGNOSIS — D171 Benign lipomatous neoplasm of skin and subcutaneous tissue of trunk: Secondary | ICD-10-CM | POA: Diagnosis not present

## 2022-09-02 DIAGNOSIS — J302 Other seasonal allergic rhinitis: Secondary | ICD-10-CM

## 2022-09-02 DIAGNOSIS — I10 Essential (primary) hypertension: Secondary | ICD-10-CM

## 2022-09-02 DIAGNOSIS — E1169 Type 2 diabetes mellitus with other specified complication: Secondary | ICD-10-CM

## 2022-09-02 DIAGNOSIS — B9681 Helicobacter pylori [H. pylori] as the cause of diseases classified elsewhere: Secondary | ICD-10-CM | POA: Diagnosis not present

## 2022-09-02 DIAGNOSIS — Z794 Long term (current) use of insulin: Secondary | ICD-10-CM | POA: Diagnosis not present

## 2022-09-02 LAB — MICROALBUMIN / CREATININE URINE RATIO
Creatinine,U: 81.5 mg/dL
Microalb Creat Ratio: 0.9 mg/g (ref 0.0–30.0)
Microalb, Ur: <0.7 mg/dL (ref 0.0–1.9)

## 2022-09-02 MED ORDER — LANCET DEVICE MISC
1.0000 | Freq: Three times a day (TID) | 0 refills | Status: AC
Start: 2022-09-02 — End: 2022-10-02

## 2022-09-02 MED ORDER — BLOOD GLUCOSE TEST VI STRP
1.0000 | ORAL_STRIP | Freq: Three times a day (TID) | 11 refills | Status: AC
Start: 2022-09-02 — End: ?

## 2022-09-02 MED ORDER — LANCETS MISC. MISC
1.0000 | Freq: Three times a day (TID) | 11 refills | Status: AC
Start: 2022-09-02 — End: ?

## 2022-09-02 MED ORDER — LORATADINE 10 MG PO TABS: 10.0000 mg | ORAL_TABLET | Freq: Every day | ORAL | 11 refills | Status: DC

## 2022-09-02 MED ORDER — BLOOD GLUCOSE MONITORING SUPPL DEVI: 1.0000 | Freq: Three times a day (TID) | 0 refills | Status: AC

## 2022-09-02 NOTE — Progress Notes (Unsigned)
   Established Patient Office Visit   Subjective  Patient ID: Angela Pugh, female    DOB: Sep 12, 1945  Age: 77 y.o. MRN: [id number]  Chief Complaint  Patient presents with  . Back Pain    Sharp pain twice on Friday in rt side of back    HPI  {History (Optional):23778}  ROS Negative unless stated above    Objective:     BP (!) 160/88 (BP Location: Right Arm, Patient Position: Sitting, Cuff Size: Normal)   Pulse 60   Temp 98.4 F (36.9 C) (Oral)   Wt 178 lb (80.7 kg)   SpO2 98%   BMI 27.88 kg/m  {Vitals History (Optional):23777}  Physical Exam Constitutional:      General: She is not in acute distress.    Appearance: Normal appearance.  HENT:     Head: Normocephalic and atraumatic.     Right Ear: Tympanic membrane and ear canal normal.     Left Ear: Tympanic membrane and ear canal normal.     Nose: Nose normal.     Mouth/Throat:     Mouth: Mucous membranes are moist.  Eyes:     Extraocular Movements: Extraocular movements intact.     Conjunctiva/sclera: Conjunctivae normal.     Pupils: Pupils are equal, round, and reactive to light.  Cardiovascular:     Rate and Rhythm: Normal rate and regular rhythm.     Heart sounds: Normal heart sounds. No murmur heard.    No gallop.  Pulmonary:     Effort: Pulmonary effort is normal. No respiratory distress.     Breath sounds: Normal breath sounds. No wheezing, rhonchi or rales.  Abdominal:     General: Bowel sounds are normal.     Palpations: Abdomen is soft.     Tenderness: There is no abdominal tenderness.  Skin:    General: Skin is warm and dry.  Neurological:     Mental Status: She is alert and oriented to person, place, and time.     No results found for any visits on 09/02/22.    Assessment & Plan:  Lipoma of back -     Ambulatory referral to Dermatology  Seasonal allergies -     Loratadine; Take 1 tablet (10 mg total) by mouth daily.  Dispense: 30 tablet; Refill: 11  Type 2 diabetes mellitus  with other specified complication, without long-term current use of insulin -     Blood Glucose Monitoring Suppl; 1 each by Does not apply route in the morning, at noon, and at bedtime. May substitute to any manufacturer covered by patient's insurance.  Dispense: 1 each; Refill: 0 -     Blood Glucose Test; 1 each by In Vitro route in the morning, at noon, and at bedtime. May substitute to any manufacturer covered by patient's insurance.  Dispense: 100 strip; Refill: 11 -     Lancet Device; 1 each by Does not apply route in the morning, at noon, and at bedtime. May substitute to any manufacturer covered by patient's insurance.  Dispense: 1 each; Refill: 0 -     Lancets Misc.; 1 each by Does not apply route in the morning, at noon, and at bedtime. May substitute to any manufacturer covered by patient's insurance.  Dispense: 100 each; Refill: 11 -     Microalbumin / creatinine urine ratio  Essential hypertension  Helicobacter pylori gastritis    Return if symptoms worsen or fail to improve.   [name], MD

## 2022-09-04 ENCOUNTER — Telehealth: Payer: Self-pay

## 2022-09-04 NOTE — Progress Notes (Signed)
Care Management & Coordination Services Pharmacy Team  Reason for Encounter: General adherence update   Contacted patient for general health update for abdominal pain.  Spoke with patient on 09/04/2022   How is patients stomach doing? Patient states she is feeling better  Have stomach issues fully resolved? Patient states her symptoms are resolving  The patient denies side effects with their medications.   Are you having any problems getting your medications from your pharmacy? Patient states she the pharmacy was not able to get Cipro until today, she plans to pick this up today.   Since last visit with PharmD, the following interventions have been made.        07/30/2022 Quentin Mulling PA-C (GI) - Patient was seen for Segmental colitis associated with diverticulosis and additional concerns. Started Cipro, Dicyclomine, Metronidazole, Diltiazem/Lidocaine compound and Nystatin topical. Discontinued Hyoscyamine.  The patient has not had an ED visit since last contact.   The patient denies any other problems with her health.   Patient denies concerns or questions for Delano Metz, PharmD at this time.   Care Gaps: AWV - completed 02/10/2022,  scheduled 02/16/2023 Last eye exam - 06/17/2022 Last foot exam - 06/23/2022 Last BP - 160/88 on 09/02/2022 Last A1C - 6.7 on 11/10/2021 Pneumovax - never done Hep C Screen  - never done Tdap - never done Shingrix - never done Covid - overdue   Star Rating Drugs: Rosuvastatin 20 mg - last filled 06/16/2022 84 DS at [facility]  Olmesartan 5 mg - last filled 07/02/2022 90 DS at [facility]  Chart Updates:  Recent office visits:  09/02/2022 Abbe Amsterdam - Patient was seen for lipoma of back and additional concerns. Started Loratadine 10 mg daily and discontinued Fexofenadine.   Recent consult visits:  08/27/2022 Willia Craze MD (ortho) - Patient was seen for lumbar radiculopathy and additional concerns. No medication changes.    08/03/2022 Ellin Goodie NP (rehab) - Patient was seen for lumbar radiculopathy and additional concerns. No medication changes.   07/30/2022 Quentin Mulling PA-C (GI) - Patient was seen for Segmental colitis associated with diverticulosis and additional concerns. Started Cipro, Dicyclomine, Metronidazole, Diltiazem/Lidocaine compound and Nystatin topical. Discontinued Hyoscyamine.   Hospital visits:  None  Medications: Outpatient Encounter Medications as of 09/04/2022  Medication Sig   amLODipine (NORVASC) 5 MG tablet TAKE 1 TABLET(5 MG) BY MOUTH DAILY   aspirin 81 MG EC tablet Take 81 mg by mouth daily. Swallow whole.   blood glucose meter kit and supplies KIT Dispense based on patient and insurance preference. Use up to four times daily as directed.   Blood Glucose Monitoring Suppl DEVI 1 each by Does not apply route in the morning, at noon, and at bedtime. May substitute to any manufacturer covered by patient's insurance.   Calcium Carb-Cholecalciferol (CALCIUM 1000 + D PO)    celecoxib (CELEBREX) 200 MG capsule Take 1 capsule (200 mg total) by mouth daily.   clobetasol ointment (TEMOVATE) 0.05 % Apply 1 Application topically 2 (two) times daily.   dicyclomine (BENTYL) 20 MG tablet Take 1 tablet (20 mg total) by mouth 3 (three) times daily as needed for spasms.   Glucose Blood (BLOOD GLUCOSE TEST STRIPS) STRP 1 each by In Vitro route in the morning, at noon, and at bedtime. May substitute to any manufacturer covered by patient's insurance.   Lancet Device MISC 1 each by Does not apply route in the morning, at noon, and at bedtime. May substitute to any manufacturer covered by patient's insurance.  Lancets Misc. MISC 1 each by Does not apply route in the morning, at noon, and at bedtime. May substitute to any manufacturer covered by patient's insurance.   loratadine (CLARITIN) 10 MG tablet Take 1 tablet (10 mg total) by mouth daily.   mesalamine (LIALDA) 1.2 g EC tablet TAKE 4 TABLETS(4.8  GRAMS) BY MOUTH DAILY WITH BREAKFAST   Multiple Vitamins-Minerals (CENTRUM ADULTS PO) Take 1 tablet by mouth daily.    NON FORMULARY Diltiazem 2%/Lidocaine5% compound Use 3 x rectally daily for 2 months to heal anal fissure   nystatin cream (MYCOSTATIN) Apply 1 Application topically 2 (two) times daily.   olmesartan (BENICAR) 5 MG tablet TAKE 2 TABLETS(10 MG) BY MOUTH DAILY   Omega-3 Fatty Acids (FISH OIL PO) 2,000 mg daily.   ondansetron (ZOFRAN) 4 MG tablet Take 1 tablet (4 mg total) by mouth every 8 (eight) hours as needed for nausea or vomiting.   pantoprazole (PROTONIX) 40 MG tablet Take 1 tablet (40 mg total) by mouth 2 (two) times daily before a meal.   potassium chloride SA (KLOR-CON M) 20 MEQ tablet TAKE 1 TABLET(20 MEQ) BY MOUTH DAILY   rosuvastatin (CRESTOR) 20 MG tablet Take 1 tablet (20 mg total) by mouth daily.   sucralfate (CARAFATE) 1 g tablet TAKE 1 TABLET(1 GRAM) BY MOUTH FOUR TIMES DAILY AT BEDTIME WITH MEALS   traMADol (ULTRAM) 50 MG tablet Take 1 tablet (50 mg total) by mouth every 8 (eight) hours as needed. (Patient not taking: Reported on 08/20/2022)   triamcinolone cream (KENALOG) 0.5 % APPLY TOPICALLY TO THE AFFECTED AREA TWICE DAILY AS NEEDED FOR DRY SKIN   vitamin B-12 (CYANOCOBALAMIN) 1000 MCG tablet Take 1,000 mcg by mouth daily.   No facility-administered encounter medications on file as of 09/04/2022.  Fill History: AMLODIPINE BESYLATE  TABLETS 09/02/2022 31 31 each    Dispensed Days Supply Quantity Provider Pharmacy  CELECOXIB  CAPSULES 08/13/2022 24 24 each      Dispensed Days Supply Quantity Provider Pharmacy  DICYCLOMINE  TABLETS 07/30/2022 16 50 each      Dispensed Days Supply Quantity Provider Pharmacy  LORATADINE  TABLETS 09/02/2022 30 30 each      Dispensed Days Supply Quantity Provider Pharmacy  MESALAMINE 1.2GM TABLETS 07/28/2022 40 160 each      Dispensed Days Supply Quantity Provider Pharmacy  NYSTATIN CREAM 30GM 07/30/2022 15 30  g      Dispensed Days Supply Quantity Provider Pharmacy  OLMESARTAN MEDOXOMIL  TABLETS 07/02/2022 90 180 each      Dispensed Days Supply Quantity Provider Pharmacy  PANTOPRAZOLE  TABLETS 07/04/2022 90 180 each      Dispensed Days Supply Quantity Provider Pharmacy  POTASSIUM CL ER TABLETS 06/16/2022 90 90 each      Dispensed Days Supply Quantity Provider Pharmacy  ROSUVASTATIN  TABLETS 06/16/2022 84 84 each      Dispensed Days Supply Quantity Provider Pharmacy  TRAMADOL  TABLETS 08/07/2022 5 15 each      Dispensed Days Supply Quantity Provider Pharmacy  TRIAMCINOLONE 0.5% CREAM 15GM 06/23/2022 7 15 g     Recent vitals BP Readings from Last 3 Encounters:  09/02/22 (!) 160/88  08/27/22 (!) 164/78  08/20/22 (!) 182/78   Pulse Readings from Last 3 Encounters:  09/02/22 60  08/27/22 65  08/20/22 (!) 59   Wt Readings from Last 3 Encounters:  09/02/22 178 lb (80.7 kg)  08/27/22 183 lb (83 kg)  07/30/22 183 lb (83 kg)   BMI Readings from  Last 3 Encounters:  09/02/22 27.88 kg/m  08/27/22 28.66 kg/m  07/30/22 28.66 kg/m    Recent lab results    Component Value Date/Time   NA 138 07/06/2022 1412   NA 142 11/03/2018 0826   K 4.3 07/06/2022 1412   CL 102 07/06/2022 1412   CO2 28 07/06/2022 1412   GLUCOSE 136 (H) 07/06/2022 1412   BUN 15 07/06/2022 1412   BUN 13 11/03/2018 0826   CREATININE 1.13 07/06/2022 1412   CREATININE 0.99 (H) 01/12/2020 1131   CALCIUM 9.7 07/06/2022 1412    Lab Results  Component Value Date   CREATININE 1.13 07/06/2022   GFR 47.48 (L) 07/06/2022   GFRNONAA 53 (L) 06/29/2022   GFRAA 66 01/12/2020   Lab Results  Component Value Date/Time   HGBA1C 6.7 (H) 11/10/2021 04:49 PM   HGBA1C 6.3 10/03/2020 09:43 AM   MICROALBUR <0.7 09/02/2022 02:36 PM    Lab Results  Component Value Date   CHOL 205 (H) 06/23/2022   HDL 56 06/23/2022   LDLCALC 119 (H) 06/23/2022   TRIG 170 (H) 06/23/2022   CHOLHDL 3.7 06/23/2022    Inetta Fermo CMA  Clinical Pharmacist Assistant [phone]

## 2022-09-08 ENCOUNTER — Other Ambulatory Visit: Payer: Self-pay | Admitting: Cardiovascular Disease

## 2022-09-24 ENCOUNTER — Ambulatory Visit (INDEPENDENT_AMBULATORY_CARE_PROVIDER_SITE_OTHER): Payer: Medicare Other | Admitting: Orthopedic Surgery

## 2022-09-24 ENCOUNTER — Other Ambulatory Visit (INDEPENDENT_AMBULATORY_CARE_PROVIDER_SITE_OTHER): Payer: Medicare Other

## 2022-09-24 DIAGNOSIS — M25512 Pain in left shoulder: Secondary | ICD-10-CM

## 2022-09-24 DIAGNOSIS — M25551 Pain in right hip: Secondary | ICD-10-CM

## 2022-09-24 NOTE — Progress Notes (Signed)
Orthopedic Spine Surgery Office Note  Assessment: Patient is a 77 y.o. female with 3 issues:  1) low back pain that radiates into the right lower extremity along the lateral aspect.  She has stenosis at L3/4 and L4/5 causing radiculopathy 2) right groin pain with positive Stinchfield and pain through hip range of motion.  X-rays show significant degenerative changes in the joint 3) left shoulder pain with overhead activity, suspect rotator cuff as the etiology   Plan: -Patient has tried PT, Motrin, lumbar steroid injections -Explained that she has symptoms and physical exam findings of both hip arthritis and radiculopathy, so recommended diagnostic and therapeutic injection to the right hip with Dr. Shon Baton.  If she does not get good relief with that, then we discussed lumbar decompression surgery for relief of her radiating leg pain.  However if she does get good relief with that then I will refer her to one of my partners for consideration of a total hip arthroplasty -In regards to her left shoulder, I suspect her rotator cuff is the etiology.  A referral was provided for physical therapy.  If she is not any better after trying this treatment, we will get an MRI of the left shoulder -Patient should return to office in 3 weeks, x-rays at next visit: None   Patient expressed understanding of the plan and all questions were answered to the patient's satisfaction.   ___________________________________________________________________________  History: Patient is a 77 y.o. female who has been previously seen in the office for low back pain radiating into her right lower extremity.  She comes in today for left shoulder pain and low back pain that radiates into the bilateral buttocks and the right lower extremity.  She states that the left arm pain started in the last month.  She feels it over the lateral aspect of the shoulder.  There was no trauma or injury that preceded the onset of pain.  She has  difficulty raising her arm because it hurts as she elevates it.  She does not have much pain if she keeps her arm at her side.  She does not have any pain at rest in the shoulder.  She does not have any pain rating past the shoulder.  She does not have any pain in the right upper extremity.  In regards to her low back, she has low back pain that radiates into the bilateral buttocks.  She also has pain radiating into the lateral aspect of the thigh that goes to the knee.  Sometimes, it radiates past the knee into the leg along the lateral aspect.  She also has pain in the groin.  Her right leg pain is more significant especially in the groin region.  She feels like the radiating leg pain and low back pain got better after an injection with Dr. Alvester Morin.  The groin pain is described as severe.  She feels it on a daily basis.  She has trouble doing even light activities like going to the grocery store because of the pain.  Denies paresthesias or numbness.  Previous treatments: physical therapy, Motrin, steroid injections   Physical Exam:  General: no acute distress, appears stated age Neurologic: alert, answering questions appropriately, following commands Respiratory: unlabored breathing on room air, symmetric chest rise Psychiatric: appropriate affect, normal cadence to speech   MSK (spine):  -Strength exam      Left  Right EHL    5/5  5/5 TA    5/5  5/5 GSC  5/5  5/5 Knee extension  5/5  5/5 Hip flexion   5/5  5/5  -Sensory exam    Sensation intact to light touch in L3-S1 nerve distributions of bilateral lower extremities  -Achilles DTR: 2/4 on the left, 2/4 on the right -Patellar tendon DTR: 2/4 on the left, 2/4 on the right  -Straight leg raise: Negative bilaterally -Femoral nerve stretch test: Negative bilaterally -Clonus: no beats bilaterally  -Left hip exam: No pain through range of motion, negative Stinchfield, negative FABER -Right hip exam: Pain with internal rotation  past 5 degrees, pain with external rotation past 30 degrees, positive FADIR, negative FABER, positive Stinchfield  Imaging: XR of the lumbar spine from 08/27/2022 was previously independently reviewed and interpreted, showing disc height loss at L3/4, L4/5, and L5/S1. Spondylolisthesis at L4/5 that shifts about 1.34mm between flexion and extension views. PI of 51, LL of 49.    MRI of the lumbar spine from 06/05/2021 was previously independently reviewed and interpreted, showing central stenosis at L3/4, central and lateral recess stenosis at L4/5. Spondylolisthesis at L4/5. T2 signal within the right facet joint at L4/5 measures 1.26mm. DDD at L3/4, L4/5, L5/S1. Foraminal stenosis on the right at L4/5. Bilateral foraminal stenosis at L3/4.   XR of the left shoulder from 09/24/2022 was independently reviewed and interpreted, showing AC joint space narrowing and osteophyte formation.  No significant degenerative changes in the glenohumeral joint.  No fracture or dislocation seen.  XR of the right hip from 09/24/2022 was independently reviewed and interpreted, showing no fracture or dislocation.  Significant joint space narrowing, subchondral sclerosis, osteophyte formation in the bilateral hips.  DEXA scan from 2021 showed a T-score of 0.7 - not consistent with osteopenia or osteoporosis  Patient name: Angela Pugh Patient MRN: [id number] Date of visit: 09/24/22

## 2022-09-25 ENCOUNTER — Other Ambulatory Visit: Payer: Self-pay

## 2022-09-25 ENCOUNTER — Encounter: Payer: Self-pay | Admitting: Sports Medicine

## 2022-09-25 ENCOUNTER — Ambulatory Visit (INDEPENDENT_AMBULATORY_CARE_PROVIDER_SITE_OTHER): Payer: Medicare Other | Admitting: Sports Medicine

## 2022-09-25 DIAGNOSIS — M25551 Pain in right hip: Secondary | ICD-10-CM

## 2022-09-25 DIAGNOSIS — M1611 Unilateral primary osteoarthritis, right hip: Secondary | ICD-10-CM

## 2022-09-25 MED ORDER — LIDOCAINE HCL 1 % IJ SOLN
4.0000 mL | INTRAMUSCULAR | Status: AC | PRN
Start: 2022-09-25 — End: 2022-09-25
  Administered 2022-09-25: 4 mL

## 2022-09-25 MED ORDER — METHYLPREDNISOLONE ACETATE 40 MG/ML IJ SUSP
40.0000 mg | INTRAMUSCULAR | Status: AC | PRN
Start: 2022-09-25 — End: 2022-09-25
  Administered 2022-09-25: 40 mg via INTRA_ARTICULAR

## 2022-09-25 NOTE — Progress Notes (Signed)
   Procedure Note  Patient: Temya Breitenstein             Date of Birth: [birth year]           MRN: [id number]             Visit Date: 09/25/2022  Procedures: Visit Diagnoses:  1. Unilateral primary osteoarthritis, right hip   2. Pain in right hip    Large Joint Inj: R hip joint on 09/25/2022 1:33 PM Indications: pain Details: 22 G 3.5 in needle, ultrasound-guided anterior approach Medications: 4 mL lidocaine 1 %; 40 mg methylPREDNISolone acetate 40 MG/ML Outcome: tolerated well, no immediate complications  Procedure: US-guided intra-articular hip injection, right After discussion on risks/benefits/indications and informed verbal consent was obtained, a timeout was performed. Patient was lying supine on exam table. The hip was cleaned with betadine and alcohol swabs. Then utilizing ultrasound guidance, the patient's femoral head and neck junction was identified and subsequently injected with 4:1 lidocaine:depomedrol via an in-plane approach with ultrasound visualization of the injectate administered into the hip joint. Patient tolerated procedure well without immediate complications.  Procedure, treatment alternatives, risks and benefits explained, specific risks discussed. Consent was given by the patient. Immediately prior to procedure a time out was called to verify the correct patient, procedure, equipment, support staff and site/side marked as required. Patient was prepped and draped in the usual sterile fashion.    - I evaluated the patient about 10 minutes post-injection and she had good improvement in pain and range of motion - follow-up with Dr. Christell Constant as indicated; I am happy to see them as needed  Madelyn Brunner, DO Primary Care Sports Medicine Physician  [facility] - Orthopedics  This note was dictated using Dragon naturally speaking software and may contain errors in syntax, spelling, or content which have not been identified prior to signing this note.

## 2022-10-01 ENCOUNTER — Other Ambulatory Visit: Payer: Self-pay | Admitting: Cardiovascular Disease

## 2022-10-02 ENCOUNTER — Other Ambulatory Visit: Payer: Self-pay | Admitting: Family Medicine

## 2022-10-02 ENCOUNTER — Other Ambulatory Visit: Payer: Self-pay | Admitting: Physician Assistant

## 2022-10-06 ENCOUNTER — Ambulatory Visit (INDEPENDENT_AMBULATORY_CARE_PROVIDER_SITE_OTHER): Payer: Medicare Other | Admitting: Podiatry

## 2022-10-06 ENCOUNTER — Encounter: Payer: Self-pay | Admitting: Podiatry

## 2022-10-06 DIAGNOSIS — E1151 Type 2 diabetes mellitus with diabetic peripheral angiopathy without gangrene: Secondary | ICD-10-CM | POA: Diagnosis not present

## 2022-10-06 DIAGNOSIS — M79674 Pain in right toe(s): Secondary | ICD-10-CM | POA: Diagnosis not present

## 2022-10-06 DIAGNOSIS — Q828 Other specified congenital malformations of skin: Secondary | ICD-10-CM

## 2022-10-06 DIAGNOSIS — B351 Tinea unguium: Secondary | ICD-10-CM | POA: Diagnosis not present

## 2022-10-06 DIAGNOSIS — M2042 Other hammer toe(s) (acquired), left foot: Secondary | ICD-10-CM | POA: Diagnosis not present

## 2022-10-06 DIAGNOSIS — M79675 Pain in left toe(s): Secondary | ICD-10-CM

## 2022-10-06 DIAGNOSIS — M2041 Other hammer toe(s) (acquired), right foot: Secondary | ICD-10-CM | POA: Diagnosis not present

## 2022-10-06 NOTE — Progress Notes (Signed)
dme  Subjective:  Patient ID: Angela Pugh, female    DOB: 08-Nov-1945,  MRN: [id number]  Angela Pugh presents to clinic today for at risk foot care. Pt has h/o NIDDM with PAD and painful porokeratotic lesion(s) both feet and painful mycotic toenails that limit ambulation. Painful toenails interfere with ambulation. Aggravating factors include wearing enclosed shoe gear. Pain is relieved with periodic professional debridement. Painful porokeratotic lesions are aggravated when weightbearing with and without shoegear. Pain is relieved with periodic professional debridement.  Chief Complaint  Patient presents with   Nail Problem    DFC BS-120 A1C-6.? PCP-[facility] PCP VST-2 months ago    New problem(s): None.   She is requesting new diabetic shoes on today's visit.  PCP is [name], MD.  Allergies  Allergen Reactions   Symbicort [Budesonide-Formoterol Fumarate] Shortness Of Breath, Swelling and Other (See Comments)    Pharyngeal edema, hoarseness, tightness in chest, lips became dark.   Acetaminophen Hives and Nausea Only   Azithromycin Hives and Nausea And Vomiting   Lipitor [Atorvastatin] Nausea Only    weakness   Zyrtec [Cetirizine]     Hives     Review of Systems: Negative except as noted in the HPI.  Objective: No changes noted in today's physical examination. There were no vitals filed for this visit. Angela Pugh is a pleasant 77 y.o. female WD, WN in NAD. AAO x 3.  Vascular Examination: CFT <3 seconds b/l LE. Palpable DP pulse(s) right foot Faintly palpable PT pulse(s) b/l LE. Nonpalpable DP pulse(s) left foot. Pedal hair absent. No pain with calf compression b/l. Lower extremity skin temperature gradient within normal limits. No edema noted b/l LE. No cyanosis or clubbing noted b/l LE.  Dermatological Examination: Pedal skin is warm and supple b/l LE. No open wounds b/l LE. No interdigital macerations noted b/l LE.   Toenails 2-5 bilaterally and  L hallux elongated, discolored, dystrophic, thickened, and crumbly with subungual debris and tenderness to dorsal palpation.   Porokeratotic lesion(s) plantar heel right foot, posterolateral right heel and submet head 2 b/l. No erythema, no edema, no drainage, no fluctuance.  Neurological Examination: Pt has subjective symptoms of neuropathy. Protective sensation intact 5/5 intact bilaterally with 10g monofilament b/l. Vibratory sensation intact b/l.  Musculoskeletal Examination: Muscle strength 5/5 to all lower extremity muscle groups bilaterally. No pain, crepitus or joint limitation noted with ROM bilateral LE. Hammertoe deformity noted 2-5 b/l.  Assessment/Plan: 1. Porokeratosis   2. Pain due to onychomycosis of toenails of both feet   3. Acquired hammertoes of both feet   4. Type II diabetes mellitus with peripheral circulatory disorder (HCC)     Orders Placed This Encounter  Procedures   For Home Use Only DME Diabetic Shoe    Dispense one pair extra depth shoes and 3 pair total contact insoles. Offload for porokeratoses submet head 2 bilaterally and plantar right heel.  FOR HOME USE ONLY DME DIABETIC SHOE -Patient was evaluated and treated. All patient's and/or POA's questions/concerns answered on today's visit. -Continue supportive shoe gear daily. -Discussed diabetic shoe benefit available based on patient's diagnoses. Patient/POA would like to proceed. Order entered for one pair extra depth shoes and 3 pair total contact insoles. Patient qualifies based on diagnoses. -Toenails were debrided in length and girth 2-5 bilaterally and L hallux with sterile nail nippers and dremel without iatrogenic bleeding.  -Porokeratotic lesion(s) plantar right heel, posterolateral heel right foot and submet head 2 b/l pared and enucleated with sterile  currette without incident. Total number of lesions debrided=4. -Patient/POA to call should there be question/concern in the interim.   Return in  about 3 months (around 01/06/2023).  Freddie Breech, DPM

## 2022-10-08 ENCOUNTER — Ambulatory Visit (INDEPENDENT_AMBULATORY_CARE_PROVIDER_SITE_OTHER): Payer: Medicare Other

## 2022-10-08 DIAGNOSIS — M2042 Other hammer toe(s) (acquired), left foot: Secondary | ICD-10-CM

## 2022-10-08 DIAGNOSIS — M2041 Other hammer toe(s) (acquired), right foot: Secondary | ICD-10-CM

## 2022-10-08 DIAGNOSIS — E1151 Type 2 diabetes mellitus with diabetic peripheral angiopathy without gangrene: Secondary | ICD-10-CM

## 2022-10-08 NOTE — Progress Notes (Signed)
Patient presents to the office today for diabetic shoe and insole measuring.  ABN signed.   Documentation of medical necessity will be sent to patient's treating diabetic doctor to verify and sign.   Patient's diabetic provider: [name], MD   Shoes and insoles will be ordered at that time and patient will be notified for an appointment for fitting when they arrive.   Patient shoe selection-   1st   Shoe choice:   [id number]  Shoe size ordered: 12

## 2022-10-13 ENCOUNTER — Encounter: Payer: Self-pay | Admitting: Physical Therapy

## 2022-10-13 ENCOUNTER — Ambulatory Visit (INDEPENDENT_AMBULATORY_CARE_PROVIDER_SITE_OTHER): Payer: Medicare Other | Admitting: Physical Therapy

## 2022-10-13 ENCOUNTER — Other Ambulatory Visit: Payer: Self-pay

## 2022-10-13 DIAGNOSIS — M6281 Muscle weakness (generalized): Secondary | ICD-10-CM | POA: Diagnosis not present

## 2022-10-13 DIAGNOSIS — M25612 Stiffness of left shoulder, not elsewhere classified: Secondary | ICD-10-CM | POA: Diagnosis not present

## 2022-10-13 DIAGNOSIS — M25512 Pain in left shoulder: Secondary | ICD-10-CM

## 2022-10-13 DIAGNOSIS — G8929 Other chronic pain: Secondary | ICD-10-CM

## 2022-10-13 DIAGNOSIS — R293 Abnormal posture: Secondary | ICD-10-CM | POA: Diagnosis not present

## 2022-10-13 NOTE — Therapy (Signed)
OUTPATIENT PHYSICAL THERAPY SHOULDER EVALUATION   Patient Name: Angela Pugh MRN: [id number] DOB:04-15-1946, 77 y.o., female Today's Date: 10/13/2022  END OF SESSION:  PT End of Session - 10/13/22 1024     Visit Number 1    Number of Visits 13    Date for PT Re-Evaluation 11/24/22    Authorization Type [employer] and [employer]    Authorization Time Period 10/13/22 to 11/24/22    PT Start Time 0848    PT Stop Time 0928    PT Time Calculation (min) 40 min    Activity Tolerance Patient tolerated treatment well    Behavior During Therapy [facility] for tasks assessed/performed             Past Medical History:  Diagnosis Date   Arthritis    back, knees, shoulder, hands , injections in pelvis, for bone spurs (05/04/2016)   Chronic back pain    Diverticulosis    History of kidney stones    HLD (hyperlipidemia)    Hypertension    Migraine    hx (05/04/2016)   Peripheral neuropathy    Radiculopathy    Sinus complaint    Sinus headache    occasional (05/04/2016)   Type II diabetes mellitus (HCC)    diet controlled, no meds now, was on insulin & po treatment at one time     Vertigo    Past Surgical History:  Procedure Laterality Date   ANTERIOR CERVICAL DECOMP/DISCECTOMY FUSION N/A 04/17/2016   Procedure: ANTERIOR CERVICAL DECOMPRESSION/DISCECTOMY FUSION CERVICAL THREE - CERVICAL FOUR, POSTERIOR CERVICAL DISCECTOMY CERVICAL SEVEN -THORASIC ONE LEFT;  Surgeon: Coletta Memos, MD;  Location: [facility] OR;  Service: Neurosurgery;  Laterality: N/A;  Anterior/Posterior   ANTERIOR FUSION CERVICAL SPINE  2000   Dr. Channing Mutters; "for bone spurs; put hardware in too"   BACK SURGERY     COLONOSCOPY  06/10/2016   [facility]   LEFT HEART CATH AND CORONARY ANGIOGRAPHY N/A 03/17/2018   Procedure: LEFT HEART CATH AND CORONARY ANGIOGRAPHY;  Surgeon: Runell Gess, MD;  Location: [facility] INVASIVE CV LAB;  Service: Cardiovascular;  Laterality: N/A;   LUMBAR LAMINECTOMY Left 06/30/2013   Procedure: Left L3-4 Extraforaminal  approach to excise far lateral herniated nucleus pulposus;  Surgeon: Kerrin Champagne, MD;  Location: [facility] OR;  Service: Orthopedics;  Laterality: Left;   TUBAL LIGATION     VAGINAL HYSTERECTOMY     Patient Active Problem List   Diagnosis Date Noted   Headache above the eye region 02/19/2021   Nasal congestion 02/19/2021   Emphysema lung (HCC) 10/04/2020   Abdominal pain, epigastric 03/18/2020   Low back pain without sciatica 10/30/2019   Body mass index (BMI) 26.0-26.9, adult 10/23/2019   Nonischemic cardiomyopathy (HCC) 04/12/2018   Chest pain 01/25/2018   Left bundle branch block 01/25/2018   Carotid artery disease (HCC) 01/25/2018   Seasonal allergies 10/18/2017   Arthritis 10/18/2017   Pre-diabetes 10/18/2017   Protein-calorie malnutrition, severe 05/05/2016   Dysphagia 05/04/2016   HNP (herniated nucleus pulposus) with myelopathy, cervical 04/17/2016   Carpal tunnel syndrome 02/10/2016   Cervical spondylosis with radiculopathy 02/10/2016   Abdominal pain 02/01/2016   Acute diverticulitis 02/01/2016   HLD (hyperlipidemia) 02/01/2016   Diabetes mellitus without complication (HCC) 02/01/2016   Hypokalemia 02/01/2016   Hypertension    Cervical spondylosis without myelopathy 01/13/2016   Bilateral carpal tunnel syndrome 01/13/2016   Herniated lumbar intervertebral disc 06/30/2013    Class: Acute    PCP: [name] MD  REFERRING PROVIDER: London Sheer, MD  REFERRING DIAG: [phone] (ICD-10-CM) - Left shoulder pain, unspecified chronicity  THERAPY DIAG:  Chronic left shoulder pain  Stiffness of left shoulder, not elsewhere classified  Muscle weakness (generalized)  Abnormal posture  Rationale for Evaluation and Treatment: Rehabilitation  ONSET DATE: "maybe 2-3 months"   SUBJECTIVE:                                                                                                                                                                                       SUBJECTIVE STATEMENT:  One night I reached up to turn the handle on the blinds and the handle broke; reached up for the stub at the top with my left hand and had a bad pain from my arm all the way to my back. Then from there the pain kept going down my arm and has hurt ever since. I've been using ben gay and having people massage my arm. Now its not hurting as badly as it was but it still hurts. No numbness in my hand, no neck issues. Doctor thinks that its something to do with my "rotary cup".   Hand dominance: Right  PERTINENT HISTORY: OA, chronic back pain, HLD, HTN, peripheral neuropathy, radiculopathy, DM, vertigo, ACDF, back surgery, cardiac cath, lumbar laminectomy  PAIN:  Are you having pain? Yes: NPRS scale: 3/10 Pain location: anterior L shoulder and biceps area closer to elbow right above the joint  Pain description: annoying  Aggravating factors: sudden movements Relieving factors: ben gay, massage   At worst can get to 6-7/10  PRECAUTIONS: None  WEIGHT BEARING RESTRICTIONS: No  FALLS:  Has patient fallen in last 6 months? No  LIVING ENVIRONMENT: Lives with: lives alone Lives in: House/apartment Stairs: 2 STE no rails; no steps inside home  Has following equipment at home: Single point cane, Environmental consultant - 2 wheeled, shower chair, and reacher, BSC   OCCUPATION: Retired   PLOF: Independent, Independent with basic ADLs, Independent with gait, and Independent with transfers  PATIENT GOALS:get pain to stop, be able to be active with family, be able to garden without pain  NEXT MD VISIT: Dr. Christell Constant May 30th   OBJECTIVE:   DIAGNOSTIC FINDINGS:  XR of the left shoulder from 09/24/2022 was independently reviewed and  interpreted, showing AC joint space narrowing and osteophyte formation.   No significant degenerative changes in the glenohumeral joint.  No  fracture or dislocation seen.  PATIENT SURVEYS:  FOTO 41, predicted 60 in 13 visits   COGNITION: Overall  cognitive status: Within functional limits for tasks assessed     SENSATION: Not tested  POSTURE:  Reduced lumbar lordosis,  increased thoracic kyphosis, rounded shoulders, forward head   UPPER EXTREMITY ROM:   Active ROM Right eval Left eval  Shoulder flexion 141* 108* before compensations at trunk  Shoulder extension    Shoulder abduction 120* 83*  Shoulder adduction    Shoulder internal rotation FIR T12 FIR T12 slow and cautious movement   Shoulder external rotation FER C7 FER C7 very slow and cautious   Elbow flexion    Elbow extension    Wrist flexion    Wrist extension    Wrist ulnar deviation    Wrist radial deviation    Wrist pronation    Wrist supination    (Blank rows = not tested)  UPPER EXTREMITY MMT:  MMT Right eval Left eval  Shoulder flexion 4+ 4+  Shoulder extension 3 3  Shoulder abduction 4 4  Shoulder adduction    Shoulder internal rotation 5 5  Shoulder external rotation 3+ 4  Middle trapezius 3 3  Lower trapezius Unable to tolerate  Unable to tolerate   Elbow flexion    Elbow extension    Wrist flexion    Wrist extension    Wrist ulnar deviation    Wrist radial deviation    Wrist pronation    Wrist supination    Grip strength (lbs)    (Blank rows = not tested)  SHOULDER SPECIAL TESTS:  Rotator cuff assessment: Drop arm test: negative and Empty can test: negative     PALPATION:  R UE/cervical musculature very tight but not tender, L cervico-thoracic paraspinals, rotator cuff, upper trap, deltoids, bicep and tricep all tight and sore    TODAY'S TREATMENT:                                                                                                                                         DATE:   Eval  Objective measures, appropriate education, care planning   TherEx  UBE L1 x3 min forward/3 min backward  Scap retractions red TB x5 Shoulder extensions red TB x5 Backwards shoulder rolls x10   PATIENT EDUCATION: Education  details: exam findings, POC, HEP  Person educated: Patient Education method: Programmer, multimedia, Facilities manager, and Handouts Education comprehension: verbalized understanding, returned demonstration, and needs further education  HOME EXERCISE PROGRAM: Access Code: [email or web address] URL: [email or web address] Date: 10/13/2022 Prepared by: Nedra Hai  Exercises - Scapular Retraction with Resistance  - 2 x daily - 7 x weekly - 1 sets - 10 reps - 2 hold - Shoulder extension with resistance - Neutral  - 2 x daily - 7 x weekly - 1 sets - 10 reps - 2 hold - Seated Backward Shoulder Rolls  - 2 x daily - 7 x weekly - 1 sets - 20 reps  ASSESSMENT:  CLINICAL IMPRESSION: Patient is a 77 y.o. F who was seen today for physical therapy evaluation and treatment for L shoulder pain. Exam reveals poor functional posture,  significant functional muscle weakness, limited L shoulder AROM, wide spread muscle spasms and trigger points, and impaired functional use of  LUE. Will benefit from skilled PT services to address pain and assist in return to optimal level of function moving forward.   OBJECTIVE IMPAIRMENTS: decreased ROM, decreased strength, hypomobility, increased fascial restrictions, increased muscle spasms, impaired flexibility, impaired UE functional use, improper body mechanics, postural dysfunction, and pain.   ACTIVITY LIMITATIONS: carrying, lifting, bathing, toileting, dressing, reach over head, and caring for others  PARTICIPATION LIMITATIONS: meal prep, cleaning, laundry, driving, shopping, community activity, and yard work  PERSONAL FACTORS: Age, Behavior pattern, Education, Fitness, Past/current experiences, and Time since onset of injury/illness/exacerbation are also affecting patient's functional outcome.   REHAB POTENTIAL: Good  CLINICAL DECISION MAKING: Stable/uncomplicated  EVALUATION COMPLEXITY: Low   GOALS: Goals reviewed with patient? Yes  SHORT TERM GOALS: Target date:  11/03/2022    Will be compliant with appropriate progressive HEP  Baseline: Goal status: INITIAL  2.  Pain to be no more than 3/10 at worst  Baseline:  Goal status: INITIAL  3.  L shoulder AROM to be equal to that of R shoulder without increased pain or hesitancy of movement Baseline:  Goal status: INITIAL  4.  Muscle spasms/myofascial impairments to have improved by at least 50%  Baseline:  Goal status: INITIAL  LONG TERM GOALS: Target date: 11/24/2022    MMT to have improved by at least 1 grade in all weak groups  Baseline:  Goal status: INITIAL  2.  Will be able to reach overhead to turn off a light or adjust blinds without increased pain  Baseline:  Goal status: INITIAL  3.  Will be able to put a plate in a cabinet without increased pain or compensations to show improved functional strength OH  Baseline:  Goal status: INITIAL  4.  Will be able to perform all gardening and tasks related to care for grandchildren without increased pain L shoulder  Baseline:  Goal status: INITIAL  5.  FOTO score to have improved to within 5 points of predicted value by time of DC to show improved subjective status  Baseline:  Goal status: INITIAL    PLAN:  PT FREQUENCY: 2x/week  PT DURATION: 6 weeks  PLANNED INTERVENTIONS: Therapeutic exercises, Therapeutic activity, Patient/Family education, Self Care, Joint mobilization, Aquatic Therapy, Dry Needling, Electrical stimulation, Spinal mobilization, Cryotherapy, Moist heat, Taping, Ultrasound, Ionotophoresis 4mg /ml Dexamethasone, Manual therapy, and Re-evaluation  PLAN FOR NEXT SESSION: L shoulder ROM as tolerated, functional postural and rotator cuff strength as tolerated, postural training/strengthening   Nedra Hai PT DPT PN2

## 2022-10-15 ENCOUNTER — Ambulatory Visit (INDEPENDENT_AMBULATORY_CARE_PROVIDER_SITE_OTHER): Payer: Medicare Other | Admitting: Orthopedic Surgery

## 2022-10-15 ENCOUNTER — Telehealth: Payer: Self-pay | Admitting: *Deleted

## 2022-10-15 ENCOUNTER — Telehealth: Payer: Self-pay

## 2022-10-15 DIAGNOSIS — M5416 Radiculopathy, lumbar region: Secondary | ICD-10-CM

## 2022-10-15 NOTE — Telephone Encounter (Signed)
Spoke with patient who is agreeable do a tele visit on 6/13 at 9:20 am. Med rec and consent done.

## 2022-10-15 NOTE — Telephone Encounter (Signed)
   Name: Angela Pugh  DOB: 1945-12-28  MRN: [id number]  Primary Cardiologist: Nanetta Batty, MD   Preoperative team, please contact this patient and set up a phone call appointment for further preoperative risk assessment. Please obtain consent and complete medication review. Thank you for your help.  I confirm that guidance regarding antiplatelet and oral anticoagulation therapy has been completed and, if necessary, noted below.  Her aspirin may be held for 7 days prior to her procedure.  Please resume as soon as hemostasis is achieved   Ronney Asters, NP 10/15/2022, 3:18 PM [facility]

## 2022-10-15 NOTE — Progress Notes (Addendum)
Orthopedic Spine Surgery Office Note  Assessment: Patient is a 77 y.o. female with 2 issues:  1.  Low back pain that radiates into the bilateral buttocks and into the right anterior lateral thigh.  Has stenosis at L3/4 and L4/5.  There is also an associated spondylolisthesis at L4/5 2.  Left shoulder pain that I suspected was due to a partial-thickness rotator cuff tear which has improved with PT   Plan: -Patient has tried PT, Motrin, steroid injections -Since patient has failed to improve with conservative treatments, offered lumbar decompression and fusion as a treatment option for her.  Patient wanted to proceed with this treatment -For her shoulder, encouraged her to continue with physical therapy and do the home exercise program.  Since she is doing better, we will not do an MRI at this time -Patient will next be seen at the date of surgery   The patient has symptoms consistent with lumbar stenosis. The patient's symptoms were not getting improvement with conservative treatment so operative management was discussed in the form of L3, L4, L5 segment laminectomies with bilateral foraminotomies and L3-5 posterior instrumented spinal fusion. The risks including but not limited to dural tear, nerve root injury, paralysis, persistent pain, pseudarthrosis, infection, bleeding, hardware failure, adjacent segment disease, heart attack, death, stroke, fracture, blindness, and need for additional procedures were discussed with the patient. The benefit of the surgery would be improvement in the patient's radiating leg and buttock pain. I explained that back pain relief is not the goal of the surgery and it is not reliably alleviated with this surgery. The alternatives to surgical management were covered with the patient and included continued monitoring, physical therapy, over-the-counter pain medications, ambulatory aids, and activity modification. All the patient's questions were answered to her  satisfaction. After this discussion, the patient expressed understanding and elected to proceed with surgical intervention.     ___________________________________________________________________________  History: Patient is a 77 y.o. female who has been previously seen in the office for symptoms consistent with lumbar radiculopathy.  She has pain that starts in her low back and radiates into her bilateral buttock and into the right anterior lateral aspect of the thigh.  She feels pain on a daily basis.  She feels pain regardless of activity.  She tried a hip injection after our last visit but that did not provide her any relief.  She states her worst pain is radiating down the right anterior thigh to the level of the knee.  She does not have any pain radiating past the knee on either side.  Denies paresthesias and numbness.   In regards to her left shoulder, she is doing much better.  She has been working with physical therapy and doing a home exercise program.  She feels her shoulder is 70% better.  Her pain is currently tolerable.  Previous treatments: physical therapy, Motrin, steroid injections    Physical Exam:  General: no acute distress, appears stated age Neurologic: alert, answering questions appropriately, following commands Respiratory: unlabored breathing on room air, symmetric chest rise Psychiatric: appropriate affect, normal cadence to speech   MSK (spine):  -Strength exam      Left  Right EHL    5/5  5/5 TA    5/5  5/5 GSC    5/5  5/5 Knee extension  5/5  5/5 Hip flexion   5/5  5/5  -Sensory exam    Sensation intact to light touch in L3-S1 nerve distributions of bilateral lower extremities  -Achilles DTR: 2/4  on the left, 2/4 on the right -Patellar tendon DTR: 2/4 on the left, 2/4 on the right  -Straight leg raise: Negative bilaterally -Femoral nerve stretch test: Negative bilaterally -Clonus: no beats bilaterally  Imaging: XR of the lumbar spine from  08/27/2022 was previously independently reviewed and interpreted, showing disc height loss at L3/4, L4/5, and L5/S1. Spondylolisthesis at L4/5 that shifts about 1.38mm between flexion and extension views. PI of 51, LL of 49.    MRI of the lumbar spine from 06/05/2021 was previously independently reviewed and interpreted, showing central stenosis at L3/4, central and lateral recess stenosis at L4/5. Spondylolisthesis at L4/5. T2 signal within the right facet joint at L4/5 measures 1.21mm. DDD at L3/4, L4/5, L5/S1. Foraminal stenosis on the right at L4/5. Bilateral foraminal stenosis at L3/4.    DEXA scan from 2021 showed a T-score of 0.7 - not consistent with osteopenia or osteoporosis   Patient name: Angela Pugh Patient MRN: [id number] Date of visit: 10/15/22

## 2022-10-15 NOTE — Telephone Encounter (Signed)
error 

## 2022-10-15 NOTE — Progress Notes (Signed)
Preoperative Scores  ODI: 24 VAS back: 7  VAS leg: 9 SF-36:  -Physical functioning: 40  -Role limitations due to physical health: 0  -Role limitations due to emotional problems: 100  -Energy/fatigue: 60  -Emotional wellbeing: 79  -Social functioning: 62.5  -Pain: 12.5  -General health: 64   Angela Craze, MD Orthopedic Surgeon

## 2022-10-15 NOTE — Telephone Encounter (Signed)
I left a message for the patient to call our office to schedule a tele visit for pre-op. Consent given, med rec needs to be completed.

## 2022-10-15 NOTE — Telephone Encounter (Signed)
   Pre-operative Risk Assessment    Patient Name: Angela Pugh  DOB: [birth year] MRN: [id number]      Request for Surgical Clearance    Procedure:   L3-5 LAMINECTOMIES AND PISF  Date of Surgery:  Clearance TBD                                 Surgeon:  DR. Willia Craze Surgeon's Group or Practice Name:  [facility] Phone number:  [phone] Fax number:  [phone] ATTN: APRIL   Type of Clearance Requested:   - Medical ; ASA x 7 DAYS PRIOR   Type of Anesthesia:  General    Additional requests/questions:    Elpidio Anis   10/15/2022, 2:44 PM

## 2022-10-15 NOTE — Telephone Encounter (Signed)
  Patient Consent for Virtual Visit        Angela Pugh has provided verbal consent on 10/15/2022 for a virtual visit (video or telephone).   CONSENT FOR VIRTUAL VISIT FOR:  Angela Pugh  By participating in this virtual visit I agree to the following:  I hereby voluntarily request, consent and authorize [facility] and its employed or contracted physicians, physician assistants, nurse practitioners or other licensed health care professionals (the Practitioner), to provide me with telemedicine health care services (the "Services") as deemed necessary by the treating Practitioner. I acknowledge and consent to receive the Services by the Practitioner via telemedicine. I understand that the telemedicine visit will involve communicating with the Practitioner through live audiovisual communication technology and the disclosure of certain medical information by electronic transmission. I acknowledge that I have been given the opportunity to request an in-person assessment or other available alternative prior to the telemedicine visit and am voluntarily participating in the telemedicine visit.  I understand that I have the right to withhold or withdraw my consent to the use of telemedicine in the course of my care at any time, without affecting my right to future care or treatment, and that the Practitioner or I may terminate the telemedicine visit at any time. I understand that I have the right to inspect all information obtained and/or recorded in the course of the telemedicine visit and may receive copies of available information for a reasonable fee.  I understand that some of the potential risks of receiving the Services via telemedicine include:  Delay or interruption in medical evaluation due to technological equipment failure or disruption; Information transmitted may not be sufficient (e.g. poor resolution of images) to allow for appropriate medical decision making by the  Practitioner; and/or  In rare instances, security protocols could fail, causing a breach of personal health information.  Furthermore, I acknowledge that it is my responsibility to provide information about my medical history, conditions and care that is complete and accurate to the best of my ability. I acknowledge that Practitioner's advice, recommendations, and/or decision may be based on factors not within their control, such as incomplete or inaccurate data provided by me or distortions of diagnostic images or specimens that may result from electronic transmissions. I understand that the practice of medicine is not an exact science and that Practitioner makes no warranties or guarantees regarding treatment outcomes. I acknowledge that a copy of this consent can be made available to me via my patient portal [facility] MyChart), or I can request a printed copy by calling the office of [facility].    I understand that my insurance will be billed for this visit.   I have read or had this consent read to me. I understand the contents of this consent, which adequately explains the benefits and risks of the Services being provided via telemedicine.  I have been provided ample opportunity to ask questions regarding this consent and the Services and have had my questions answered to my satisfaction. I give my informed consent for the services to be provided through the use of telemedicine in my medical care

## 2022-10-26 ENCOUNTER — Telehealth: Payer: Self-pay | Admitting: Cardiovascular Disease

## 2022-10-26 NOTE — Telephone Encounter (Signed)
I called pt and let her know that the landline would be fine for this appt.

## 2022-10-26 NOTE — Telephone Encounter (Signed)
Follow Up:    Patient says she only have a land line phone. She wants to know if she can use t his for her phone visit appointment?

## 2022-10-27 ENCOUNTER — Encounter: Payer: Self-pay | Admitting: Physical Therapy

## 2022-10-27 ENCOUNTER — Ambulatory Visit (INDEPENDENT_AMBULATORY_CARE_PROVIDER_SITE_OTHER): Payer: Medicare Other | Admitting: Physical Therapy

## 2022-10-27 DIAGNOSIS — M6281 Muscle weakness (generalized): Secondary | ICD-10-CM | POA: Diagnosis not present

## 2022-10-27 DIAGNOSIS — G8929 Other chronic pain: Secondary | ICD-10-CM

## 2022-10-27 DIAGNOSIS — R293 Abnormal posture: Secondary | ICD-10-CM | POA: Diagnosis not present

## 2022-10-27 DIAGNOSIS — M25512 Pain in left shoulder: Secondary | ICD-10-CM | POA: Diagnosis not present

## 2022-10-27 DIAGNOSIS — M25612 Stiffness of left shoulder, not elsewhere classified: Secondary | ICD-10-CM | POA: Diagnosis not present

## 2022-10-27 NOTE — Therapy (Addendum)
OUTPATIENT PHYSICAL THERAPY TREATMENT  / DISCHARGE   Patient Name: Logen Babiak MRN: [id number] DOB:07-Mar-1946, 78 y.o., female Today's Date: 10/27/2022  END OF SESSION:  PT End of Session - 10/27/22 1307     Visit Number 2    Number of Visits 13    Date for PT Re-Evaluation 11/24/22    Authorization Type [employer] and [employer]    Authorization Time Period 10/13/22 to 11/24/22    PT Start Time 1300    PT Stop Time 1340    PT Time Calculation (min) 40 min    Activity Tolerance Patient tolerated treatment well    Behavior During Therapy [facility] for tasks assessed/performed              Past Medical History:  Diagnosis Date   Arthritis    back, knees, shoulder, hands , injections in pelvis, for bone spurs (05/04/2016)   Chronic back pain    Diverticulosis    History of kidney stones    HLD (hyperlipidemia)    Hypertension    Migraine    hx (05/04/2016)   Peripheral neuropathy    Radiculopathy    Sinus complaint    Sinus headache    occasional (05/04/2016)   Type II diabetes mellitus (HCC)    diet controlled, no meds now, was on insulin & po treatment at one time     Vertigo    Past Surgical History:  Procedure Laterality Date   ANTERIOR CERVICAL DECOMP/DISCECTOMY FUSION N/A 04/17/2016   Procedure: ANTERIOR CERVICAL DECOMPRESSION/DISCECTOMY FUSION CERVICAL THREE - CERVICAL FOUR, POSTERIOR CERVICAL DISCECTOMY CERVICAL SEVEN -THORASIC ONE LEFT;  Surgeon: Coletta Memos, MD;  Location: [facility] OR;  Service: Neurosurgery;  Laterality: N/A;  Anterior/Posterior   ANTERIOR FUSION CERVICAL SPINE  2000   Dr. Channing Mutters; "for bone spurs; put hardware in too"   BACK SURGERY     COLONOSCOPY  06/10/2016   [facility]   LEFT HEART CATH AND CORONARY ANGIOGRAPHY N/A 03/17/2018   Procedure: LEFT HEART CATH AND CORONARY ANGIOGRAPHY;  Surgeon: Runell Gess, MD;  Location: [facility] INVASIVE CV LAB;  Service: Cardiovascular;  Laterality: N/A;   LUMBAR LAMINECTOMY Left 06/30/2013   Procedure: Left L3-4  Extraforaminal approach to excise far lateral herniated nucleus pulposus;  Surgeon: Kerrin Champagne, MD;  Location: [facility] OR;  Service: Orthopedics;  Laterality: Left;   TUBAL LIGATION     VAGINAL HYSTERECTOMY     Patient Active Problem List   Diagnosis Date Noted   Headache above the eye region 02/19/2021   Nasal congestion 02/19/2021   Emphysema lung (HCC) 10/04/2020   Abdominal pain, epigastric 03/18/2020   Low back pain without sciatica 10/30/2019   Body mass index (BMI) 26.0-26.9, adult 10/23/2019   Nonischemic cardiomyopathy (HCC) 04/12/2018   Chest pain 01/25/2018   Left bundle branch block 01/25/2018   Carotid artery disease (HCC) 01/25/2018   Seasonal allergies 10/18/2017   Arthritis 10/18/2017   Pre-diabetes 10/18/2017   Protein-calorie malnutrition, severe 05/05/2016   Dysphagia 05/04/2016   HNP (herniated nucleus pulposus) with myelopathy, cervical 04/17/2016   Carpal tunnel syndrome 02/10/2016   Cervical spondylosis with radiculopathy 02/10/2016   Abdominal pain 02/01/2016   Acute diverticulitis 02/01/2016   HLD (hyperlipidemia) 02/01/2016   Diabetes mellitus without complication (HCC) 02/01/2016   Hypokalemia 02/01/2016   Hypertension    Cervical spondylosis without myelopathy 01/13/2016   Bilateral carpal tunnel syndrome 01/13/2016   Herniated lumbar intervertebral disc 06/30/2013    Class: Acute    PCP: Abbe Amsterdam  R MD   REFERRING PROVIDER: London Sheer, MD  REFERRING DIAG: [phone] (ICD-10-CM) - Left shoulder pain, unspecified chronicity  THERAPY DIAG:  Chronic left shoulder pain  Stiffness of left shoulder, not elsewhere classified  Muscle weakness (generalized)  Abnormal posture  Rationale for Evaluation and Treatment: Rehabilitation  ONSET DATE: "maybe 2-3 months"   SUBJECTIVE:                                                                                                                                                                                       SUBJECTIVE STATEMENT: "I've had some pain" but reports it's usually when I go to use it (reaching out to side)  Hand dominance: Right  PERTINENT HISTORY: OA, chronic back pain, HLD, HTN, peripheral neuropathy, radiculopathy, DM, vertigo, ACDF, back surgery, cardiac cath, lumbar laminectomy  PAIN:  Are you having pain? Yes: NPRS scale: "just a little bit, like a 7"/10 Pain location: anterior L shoulder and biceps area closer to elbow right above the joint  Pain description: annoying  Aggravating factors: sudden movements Relieving factors: ben gay, massage   At worst can get to 6-7/10  PRECAUTIONS: None  WEIGHT BEARING RESTRICTIONS: No  FALLS:  Has patient fallen in last 6 months? No  LIVING ENVIRONMENT: Lives with: lives alone Lives in: House/apartment Stairs: 2 STE no rails; no steps inside home  Has following equipment at home: Single point cane, Environmental consultant - 2 wheeled, shower chair, and reacher, BSC   OCCUPATION: Retired   PLOF: Independent, Independent with basic ADLs, Independent with gait, and Independent with transfers  PATIENT GOALS:get pain to stop, be able to be active with family, be able to garden without pain  NEXT MD VISIT: Dr. Christell Constant May 30th   OBJECTIVE:   DIAGNOSTIC FINDINGS:  XR of the left shoulder from 09/24/2022 was independently reviewed and  interpreted, showing AC joint space narrowing and osteophyte formation.   No significant degenerative changes in the glenohumeral joint.  No  fracture or dislocation seen.  PATIENT SURVEYS:  10/13/22: FOTO 41, predicted 60 in 13 visits  10/27/22: FOTO 46   POSTURE: Reduced lumbar lordosis, increased thoracic kyphosis, rounded shoulders, forward head   UPPER EXTREMITY ROM:   Active ROM Right eval Left eval Left 10/27/22  Shoulder flexion 141* 108* before compensations at trunk 117 (seated)  Shoulder extension     Shoulder abduction 120* 83* 89 (seated)  Shoulder adduction     Shoulder  internal rotation FIR T12 FIR T12 slow and cautious movement    Shoulder external rotation FER C7 FER C7 very slow and cautious    (Blank rows = not tested)  UPPER  EXTREMITY MMT:  MMT Right eval Left eval  Shoulder flexion 4+ 4+  Shoulder extension 3 3  Shoulder abduction 4 4  Shoulder adduction    Shoulder internal rotation 5 5  Shoulder external rotation 3+ 4  Middle trapezius 3 3  Lower trapezius Unable to tolerate  Unable to tolerate   (Blank rows = not tested)  SHOULDER SPECIAL TESTS: Eval: Rotator cuff assessment: Drop arm test: negative and Empty can test: negative  PALPATION:  R UE/cervical musculature very tight but not tender, L cervico-thoracic paraspinals, rotator cuff, upper trap, deltoids, bicep and tricep all tight and sore    TODAY'S TREATMENT:                                                                                                                                         DATE:  10/27/22 TherEx UBE L2 x3 min forward/3 min backward  Rows L2 band 2x10; 5 sec hold Shoulder extension L2 band 2x10 Shoulder rolls backward x20 reps Standing Lt shoulder ER L2 band 2x10 ROM measurements - see above for details Standing Lt shoulder flexion 2x10; 2# Standing Lt shoulder scaption x12; 2#  Eval Objective measures, appropriate education, care planning   TherEx UBE L1 x3 min forward/3 min backward  Scap retractions red TB x5 Shoulder extensions red TB x5 Backwards shoulder rolls x10   PATIENT EDUCATION: Education details: exam findings, POC, HEP  Person educated: Patient Education method: Explanation, Demonstration, and Handouts Education comprehension: verbalized understanding, returned demonstration, and needs further education  HOME EXERCISE PROGRAM: Access Code: [email or web address] URL: [email or web address] Date: 10/27/2022 Prepared by: Moshe Cipro  Exercises - Scapular Retraction with Resistance  - 2 x daily - 7 x weekly - 1 sets - 10  reps - 2 hold - Shoulder extension with resistance - Neutral  - 2 x daily - 7 x weekly - 1 sets - 10 reps - 2 hold - Seated Backward Shoulder Rolls  - 2 x daily - 7 x weekly - 1 sets - 20 reps - Shoulder External Rotation with Anchored Resistance  - 2 x daily - 7 x weekly - 1 sets - 20 reps - Shoulder Internal Rotation with Resistance  - 2 x daily - 7 x weekly - 1 sets - 20 reps - Single Arm Shoulder Flexion with Dumbbell  - 1 x daily - 7 x weekly - 1 sets - 20 reps - Single Arm Scaption with Dumbbell  - 1 x daily - 7 x weekly - 1 sets - 20 reps  ASSESSMENT:  CLINICAL IMPRESSION: Pt reporting plans for upcoming spinal surgery and would like to hold PT after today.  Session today then focused on HEP and goal assessment.  Will hold PT, pt can return within 30 days if needed; otherwise will need new referral if/when appropriate to return.  OBJECTIVE IMPAIRMENTS: decreased ROM, decreased strength, hypomobility, increased fascial restrictions, increased muscle  spasms, impaired flexibility, impaired UE functional use, improper body mechanics, postural dysfunction, and pain.   ACTIVITY LIMITATIONS: carrying, lifting, bathing, toileting, dressing, reach over head, and caring for others  PARTICIPATION LIMITATIONS: meal prep, cleaning, laundry, driving, shopping, community activity, and yard work  PERSONAL FACTORS: Age, Behavior pattern, Education, Fitness, Past/current experiences, and Time since onset of injury/illness/exacerbation are also affecting patient's functional outcome.   REHAB POTENTIAL: Good  CLINICAL DECISION MAKING: Stable/uncomplicated  EVALUATION COMPLEXITY: Low   GOALS: Goals reviewed with patient? Yes  SHORT TERM GOALS: Target date: 11/03/2022    Will be compliant with appropriate progressive HEP  Baseline: Goal status: MET 10/27/22  2.  Pain to be no more than 3/10 at worst  Baseline:  Goal status: IN PROGRESS 10/27/22   3.  L shoulder AROM to be equal to that of R  shoulder without increased pain or hesitancy of movement Baseline:  Goal status: IN PROGRESS 10/27/22  4.  Muscle spasms/myofascial impairments to have improved by at least 50%  Baseline:  Goal status: MET 10/27/22 (75% better)  LONG TERM GOALS: Target date: 11/24/2022    MMT to have improved by at least 1 grade in all weak groups  Baseline:  Goal status: IN PROGRESS 10/27/22  2.  Will be able to reach overhead to turn off a light or adjust blinds without increased pain  Baseline:  Goal status: IN PROGRESS 10/27/22  3.  Will be able to put a plate in a cabinet without increased pain or compensations to show improved functional strength OH  Baseline:  Goal status: IN PROGRESS 10/27/22  4.  Will be able to perform all gardening and tasks related to care for grandchildren without increased pain L shoulder  Baseline:   Goal status: MET 10/27/22  5.  FOTO score to have improved to within 5 points of predicted value by time of DC to show improved subjective status  Baseline:  Goal status: IN PROGRESS 10/27/22    PLAN:  PT FREQUENCY: 2x/week  PT DURATION: 6 weeks  PLANNED INTERVENTIONS: Therapeutic exercises, Therapeutic activity, Patient/Family education, Self Care, Joint mobilization, Aquatic Therapy, Dry Needling, Electrical stimulation, Spinal mobilization, Cryotherapy, Moist heat, Taping, Ultrasound, Ionotophoresis 4mg /ml Dexamethasone, Manual therapy, and Re-evaluation  PLAN FOR NEXT SESSION: hold PT, reasses or d/c   Clarita Crane, PT, DPT 10/27/22 1:51 PM   PHYSICAL THERAPY DISCHARGE SUMMARY  Visits from Start of Care: 2  Current functional level related to goals / functional outcomes: See note   Remaining deficits: See note   Education / Equipment: HEP  Patient goals were  mostly met . Patient is being discharged due to not returning since the last visit.   Chyrel Masson, PT, DPT, OCS, ATC 12/24/22  2:40 PM

## 2022-10-28 NOTE — Progress Notes (Signed)
Virtual Visit via Telephone Note   Because of Angela Pugh's co-morbid illnesses, she is at least at moderate risk for complications without adequate follow up.  This format is felt to be most appropriate for this patient at this time.  The patient did not have access to video technology/had technical difficulties with video requiring transitioning to audio format only (telephone).  All issues noted in this document were discussed and addressed.  No physical exam could be performed with this format.  Please refer to the patient's chart for her consent to telehealth for [facility].  Evaluation Performed:  Preoperative cardiovascular risk assessment _____________   Date:  10/29/2022   Patient ID:  Angela Pugh, DOB January 28, 1946, MRN [id number] Patient Location:  Home Provider location:   Office  Primary Care Provider:  [name], MD Primary Cardiologist:  Angela Batty, MD  Chief Complaint / Patient Profile   77 y.o. y/o female with a h/o left BBB, NICM with EF 47% on cardiac MRI 06/2018, most recent echo 12/18/21 with EF 50-55%, HTN, HLD who is pending L3-5 laminectomies and PISF and presents today for telephonic preoperative cardiovascular risk assessment.  History of Present Illness    Angela Pugh is a 77 y.o. female who presents via audio/video conferencing for a telehealth visit today.  Pt was last seen in cardiology clinic on 05/06/22 by Dr. Allyson Pugh.  At that time Angela Pugh was doing well.  The patient is now pending procedure as outlined above. Since her last visit, she  denies chest pain, shortness of breath, lower extremity edema, fatigue, palpitations, melena, hematuria, hemoptysis, diaphoresis, weakness, presyncope, syncope, orthopnea, and PND. She remains active at home doing house and yard work and is able to achieve > 4 METS activity without concerning cardiac symptoms.    Past Medical History    Past Medical History:  Diagnosis Date    Arthritis    back, knees, shoulder, hands , injections in pelvis, for bone spurs (05/04/2016)   Chronic back pain    Diverticulosis    History of kidney stones    HLD (hyperlipidemia)    Hypertension    Migraine    hx (05/04/2016)   Peripheral neuropathy    Radiculopathy    Sinus complaint    Sinus headache    occasional (05/04/2016)   Type II diabetes mellitus (HCC)    diet controlled, no meds now, was on insulin & po treatment at one time     Vertigo    Past Surgical History:  Procedure Laterality Date   ANTERIOR CERVICAL DECOMP/DISCECTOMY FUSION N/A 04/17/2016   Procedure: ANTERIOR CERVICAL DECOMPRESSION/DISCECTOMY FUSION CERVICAL THREE - CERVICAL FOUR, POSTERIOR CERVICAL DISCECTOMY CERVICAL SEVEN -THORASIC ONE LEFT;  Surgeon: Angela Memos, MD;  Location: [facility] OR;  Service: Neurosurgery;  Laterality: N/A;  Anterior/Posterior   ANTERIOR FUSION CERVICAL SPINE  2000   Dr. Channing Pugh; "for bone spurs; put hardware in too"   BACK SURGERY     COLONOSCOPY  06/10/2016   [facility]   LEFT HEART CATH AND CORONARY ANGIOGRAPHY N/A 03/17/2018   Procedure: LEFT HEART CATH AND CORONARY ANGIOGRAPHY;  Surgeon: Angela Gess, MD;  Location: [facility] INVASIVE CV LAB;  Service: Cardiovascular;  Laterality: N/A;   LUMBAR LAMINECTOMY Left 06/30/2013   Procedure: Left L3-4 Extraforaminal approach to excise far lateral herniated nucleus pulposus;  Surgeon: Angela Champagne, MD;  Location: [facility] OR;  Service: Orthopedics;  Laterality: Left;   TUBAL LIGATION     VAGINAL  HYSTERECTOMY      Allergies  Allergies  Allergen Reactions   Symbicort [Budesonide-Formoterol Fumarate] Shortness Of Breath, Swelling and Other (See Comments)    Pharyngeal edema, hoarseness, tightness in chest, lips became dark.   Acetaminophen Hives and Nausea Only   Azithromycin Hives and Nausea And Vomiting   Lipitor [Atorvastatin] Nausea Only    weakness   Zyrtec [Cetirizine]     Hives     Home Medications    Prior to Admission  medications   Medication Sig Start Date End Date Taking? Authorizing Provider  amLODipine (NORVASC) 5 MG tablet TAKE 1 TABLET(5 MG) BY MOUTH DAILY 10/01/22   Angela Gess, MD  aspirin 81 MG EC tablet Take 81 mg by mouth daily. Swallow whole.    [provider]  blood glucose meter kit and supplies KIT Dispense based on patient and insurance preference. Use up to four times daily as directed. 02/10/22   Angela Saint, MD  Blood Glucose Monitoring Suppl DEVI 1 each by Does not apply route in the morning, at noon, and at bedtime. May substitute to any manufacturer covered by patient's insurance. 09/02/22   Angela Saint, MD  Calcium Carb-Cholecalciferol (CALCIUM 1000 + D PO)     [provider]  celecoxib (CELEBREX) 200 MG capsule Take 1 capsule (200 mg total) by mouth daily. 02/19/22   Angela Champagne, MD  clobetasol ointment (TEMOVATE) 0.05 % Apply 1 Application topically 2 (two) times daily. 01/08/22   Angela Saint, MD  dicyclomine (BENTYL) 20 MG tablet Take 1 tablet (20 mg total) by mouth 3 (three) times daily as needed for spasms. 07/30/22   Angela Albee, PA-C  Glucose Blood (BLOOD GLUCOSE TEST STRIPS) STRP 1 each by In Vitro route in the morning, at noon, and at bedtime. May substitute to any manufacturer covered by patient's insurance. 09/02/22   Angela Saint, MD  Lancets Misc. MISC 1 each by Does not apply route in the morning, at noon, and at bedtime. May substitute to any manufacturer covered by patient's insurance. 09/02/22   Angela Saint, MD  loratadine (CLARITIN) 10 MG tablet Take 1 tablet (10 mg total) by mouth daily. 09/02/22   Angela Saint, MD  mesalamine (LIALDA) 1.2 g EC tablet TAKE 4 TABLETS(4.8 GRAMS) BY MOUTH DAILY WITH BREAKFAST 04/27/22   Pugh, Angela V, DO  Multiple Vitamins-Minerals (CENTRUM ADULTS PO) Take 1 tablet by mouth daily.     [provider]  NON FORMULARY Diltiazem 2%/Lidocaine5% compound Use 3 x rectally daily for 2  months to heal anal fissure 07/30/22   Angela Albee, PA-C  nystatin cream (MYCOSTATIN) Apply 1 Application topically 2 (two) times daily. 07/30/22   Angela Albee, PA-C  olmesartan (BENICAR) 5 MG tablet TAKE 2 TABLETS BY MOUTH DAILY. NEED APPOINTMENT 10/02/22   Angela Saint, MD  Omega-3 Fatty Acids (FISH OIL PO) 2,000 mg daily.    [provider]  ondansetron (ZOFRAN) 4 MG tablet Take 1 tablet (4 mg total) by mouth every 8 (eight) hours as needed for nausea or vomiting. 04/17/22   Angela Saint, MD  pantoprazole (PROTONIX) 40 MG tablet Take 1 tablet (40 mg total) by mouth 2 (two) times daily before a meal. 07/06/22   Angela Albee, PA-C  potassium chloride SA (KLOR-CON M) 20 MEQ tablet TAKE 1 TABLET(20 MEQ) BY MOUTH DAILY 06/16/22   Angela Saint, MD  rosuvastatin (CRESTOR) 20 MG tablet TAKE 1 TABLET(20  MG) BY MOUTH DAILY 09/08/22   Angela Gess, MD  sucralfate (CARAFATE) 1 g tablet TAKE 1 TABLET(1 GRAM) BY MOUTH FOUR TIMES DAILY AT BEDTIME WITH MEALS 10/02/22   Angela Albee, PA-C  traMADol (ULTRAM) 50 MG tablet Take 1 tablet (50 mg total) by mouth every 8 (eight) hours as needed. Patient not taking: Reported on 08/20/2022 08/07/22 08/07/23  Juanda Chance, NP  triamcinolone cream (KENALOG) 0.5 % APPLY TOPICALLY TO THE AFFECTED AREA TWICE DAILY AS NEEDED FOR DRY SKIN 01/12/22   Angela Saint, MD  vitamin B-12 (CYANOCOBALAMIN) 1000 MCG tablet Take 1,000 mcg by mouth daily.    [provider]    Physical Exam    Vital Signs:  Angela Pugh does not have vital signs available for review today.  Given telephonic nature of communication, physical exam is limited. AAOx3. NAD. Normal affect.  Speech and respirations are unlabored.  Accessory Clinical Findings    None  Assessment & Plan    1.  Preoperative Cardiovascular Risk Assessment: According to the Revised Cardiac Risk Index (RCRI), her Perioperative Risk of Major Cardiac Event is (%): 0.9.  Her Functional Capacity in METs is: 7.59 according to the Duke Activity Status Index (DASI). The patient is doing well from a cardiac perspective. Therefore, based on ACC/AHA guidelines, the patient would be at acceptable risk for the planned procedure without further cardiovascular testing.   The patient was advised that if she develops new symptoms prior to surgery to contact our office to arrange for a follow-up visit, and she verbalized understanding.  Her aspirin may be held for 7 days prior to her procedure. Please resume as soon as hemostasis is achieved   A copy of this note will be routed to requesting surgeon.  Time:   Today, I have spent 10 minutes with the patient with telehealth technology discussing medical history, symptoms, and management plan.    Levi Aland, NP-C  10/29/2022, 9:20 AM [address], [address] Office [phone] Fax [phone]

## 2022-10-29 ENCOUNTER — Encounter: Payer: Medicare Other | Admitting: Physical Therapy

## 2022-10-29 ENCOUNTER — Encounter: Payer: Self-pay | Admitting: Nurse Practitioner

## 2022-10-29 ENCOUNTER — Ambulatory Visit: Payer: Medicare Other | Attending: Cardiology | Admitting: Nurse Practitioner

## 2022-10-29 DIAGNOSIS — Z01810 Encounter for preprocedural cardiovascular examination: Secondary | ICD-10-CM

## 2022-11-02 ENCOUNTER — Telehealth: Payer: Self-pay | Admitting: Orthopedic Surgery

## 2022-11-02 MED ORDER — PREGABALIN 75 MG PO CAPS: 75.0000 mg | ORAL_CAPSULE | Freq: Two times a day (BID) | ORAL | 0 refills | Status: DC

## 2022-11-02 NOTE — Telephone Encounter (Signed)
Pt called requesting a diff pain meds for right leg pains. She states tramadol is not working. Please send to pharmacy on file. Pt phone number is [phone].

## 2022-11-03 ENCOUNTER — Telehealth: Payer: Self-pay | Admitting: Orthopedic Surgery

## 2022-11-03 MED ORDER — TRAMADOL HCL 50 MG PO TABS: 50.0000 mg | ORAL_TABLET | Freq: Four times a day (QID) | ORAL | 0 refills | Status: DC | PRN

## 2022-11-03 MED ORDER — OXYCODONE HCL 5 MG PO TABS: 2.5000 mg | ORAL_TABLET | Freq: Four times a day (QID) | ORAL | 0 refills | Status: DC | PRN

## 2022-11-03 NOTE — Telephone Encounter (Signed)
I called patient to let her know what Dr. Christell Constant says, she states that the tramadol makes her numb.  She would like this to be changed to something else.

## 2022-11-03 NOTE — Telephone Encounter (Signed)
I spoke with patient, and scheduled her for surgery on 12/08/22. Patient was hoping to get in earlier but due to hospital schedule nothing is available sooner. Patient states she took one dose of medication that was sent in yesterday for her at 7:30 pm. Medication made her very dizzy and patient is afraid to get up to walk thinking she will fall. Patient does not know what to do. Her pain is constant now, and wakes her up thru out the night. Please call to discuss.

## 2022-11-03 NOTE — Addendum Note (Signed)
Addended by: Willia Craze on: 11/03/2022 05:54 PM   Modules accepted: Orders

## 2022-11-06 ENCOUNTER — Telehealth: Payer: Self-pay | Admitting: Pharmacist

## 2022-11-06 NOTE — Progress Notes (Signed)
Contacted patient regarding upcoming appointment with [facility] pharmacist. Patient would benefit from or requests a future appointment with clinical pharmacist. Appointment rescheduled to discuss hypertension.  Catie Eppie Gibson, PharmD, BCACP, CPP Clinical Pharmacist [facility] [phone]

## 2022-11-23 ENCOUNTER — Telehealth: Payer: Self-pay | Admitting: Orthopedic Surgery

## 2022-11-23 NOTE — Telephone Encounter (Signed)
Pt called statins she got denied for surgery from her insurance company and need to speak to Dr Christell Constant for next steps. Please call pt at [phone].

## 2022-11-30 NOTE — Pre-Procedure Instructions (Signed)
Surgical Instructions   Your procedure is scheduled on December 08, 2022. Report to [facility] Main Entrance "A" at 5:30 A.M., then check in with the Admitting office. Any questions or running late day of surgery: call [phone]  Questions prior to your surgery date: call [phone], Monday-Friday, 8am-4pm. If you experience any cold or flu symptoms such as cough, fever, chills, shortness of breath, etc. between now and your scheduled surgery, please notify us at the above number.     Remember:  Do not eat after midnight the night before your surgery  You may drink clear liquids until 4:30 AM the morning of your surgery.   Clear liquids allowed are: Water, Non-Citrus Juices (without pulp), Carbonated Beverages, Clear Tea, Black Coffee Only (NO MILK, CREAM OR POWDERED CREAMER of any kind), and Gatorade.  Patient Instructions  The night before surgery:  No food after midnight. ONLY clear liquids after midnight  The day of surgery (if you have diabetes): Drink ONE (1) 12 oz G2 given to you in your pre admission testing appointment by 4:30 AM the morning of surgery. Drink in one sitting. Do not sip.  This drink was given to you during your hospital  pre-op appointment visit.  Nothing else to drink after completing the  12 oz bottle of G2.         If you have questions, please contact your surgeon's office.      Take these medicines the morning of surgery with A SIP OF WATER:  amLODipine (NORVASC)   pantoprazole (PROTONIX)   rosuvastatin (CRESTOR)   sucralfate (CARAFATE)     May take these medicines IF NEEDED:  loratadine (CLARITIN)    Follow your surgeon's instructions on when to stop Aspirin.  If no instructions were given by your surgeon then you will need to call the office to get those instructions.     One week prior to surgery, STOP taking any Aleve, Naproxen, Ibuprofen, Motrin, Advil, Goody's, BC's, all herbal medications, fish oil, and non-prescription vitamins.  This includes your medication: Aspirin-Acetaminophen-Caffeine (GOODY HEADACHE PO) and celecoxib (CELEBREX)   HOW TO MANAGE YOUR DIABETES BEFORE AND AFTER SURGERY  Why is it important to control my blood sugar before and after surgery? Improving blood sugar levels before and after surgery helps healing and can limit problems. A way of improving blood sugar control is eating a healthy diet by:  Eating less sugar and carbohydrates  Increasing activity/exercise  Talking with your doctor about reaching your blood sugar goals High blood sugars (greater than 180 mg/dL) can raise your risk of infections and slow your recovery, so you will need to focus on controlling your diabetes during the weeks before surgery. Make sure that the doctor who takes care of your diabetes knows about your planned surgery including the date and location.  How do I manage my blood sugar before surgery? Check your blood sugar at least 4 times a day, starting 2 days before surgery, to make sure that the level is not too high or low.  Check your blood sugar the morning of your surgery when you wake up and every 2 hours until you get to the Short Stay unit.  If your blood sugar is less than 70 mg/dL, you will need to treat for low blood sugar: Do not take insulin. Treat a low blood sugar (less than 70 mg/dL) with  cup of clear juice (cranberry or apple), 4 glucose tablets, OR glucose gel. Recheck blood sugar in 15 minutes after treatment (  to make sure it is greater than 70 mg/dL). If your blood sugar is not greater than 70 mg/dL on recheck, call [phone] for further instructions. Report your blood sugar to the short stay nurse when you get to Short Stay.  If you are admitted to the hospital after surgery: Your blood sugar will be checked by the staff and you will probably be given insulin after surgery (instead of oral diabetes medicines) to make sure you have good blood sugar levels. The goal for blood sugar  control after surgery is 80-180 mg/dL.                      Do NOT Smoke (Tobacco/Vaping) for 24 hours prior to your procedure.  If you use a CPAP at night, you may bring your mask/headgear for your overnight stay.   You will be asked to remove any contacts, glasses, piercing's, hearing aid's, dentures/partials prior to surgery. Please bring cases for these items if needed.    Patients discharged the day of surgery will not be allowed to drive home, and someone needs to stay with them for 24 hours.  SURGICAL WAITING ROOM VISITATION Patients may have no more than 2 support people in the waiting area - these visitors may rotate.   Pre-op nurse will coordinate an appropriate time for 1 ADULT support person, who may not rotate, to accompany patient in pre-op.  Children under the age of 82 must have an adult with them who is not the patient and must remain in the main waiting area with an adult.  If the patient needs to stay at the hospital during part of their recovery, the visitor guidelines for inpatient rooms apply.  Please refer to the [facility] website for the visitor guidelines for any additional information.   If you received a COVID test during your pre-op visit  it is requested that you wear a mask when out in public, stay away from anyone that may not be feeling well and notify your surgeon if you develop symptoms. If you have been in contact with anyone that has tested positive in the last 10 days please notify you surgeon.      Pre-operative 5 CHG Bathing Instructions   You can play a key role in reducing the risk of infection after surgery. Your skin needs to be as free of germs as possible. You can reduce the number of germs on your skin by washing with CHG (chlorhexidine gluconate) soap before surgery. CHG is an antiseptic soap that kills germs and continues to kill germs even after washing.   DO NOT use if you have an allergy to chlorhexidine/CHG or antibacterial soaps.  If your skin becomes reddened or irritated, stop using the CHG and notify one of our RNs at [phone].   Please shower with the CHG soap starting 4 days before surgery using the following schedule:     Please keep in mind the following:  DO NOT shave, including legs and underarms, starting the day of your first shower.   You may shave your face at any point before/day of surgery.  Place clean sheets on your bed the day you start using CHG soap. Use a clean washcloth (not used since being washed) for each shower. DO NOT sleep with pets once you start using the CHG.   CHG Shower Instructions:  If you choose to wash your hair and private area, wash first with your normal shampoo/soap.  After you use shampoo/soap, rinse  your hair and body thoroughly to remove shampoo/soap residue.  Turn the water OFF and apply about 3 tablespoons (45 ml) of CHG soap to a CLEAN washcloth.  Apply CHG soap ONLY FROM YOUR NECK DOWN TO YOUR TOES (washing for 3-5 minutes)  DO NOT use CHG soap on face, private areas, open wounds, or sores.  Pay special attention to the area where your surgery is being performed.  If you are having back surgery, having someone wash your back for you may be helpful. Wait 2 minutes after CHG soap is applied, then you may rinse off the CHG soap.  Pat dry with a clean towel  Put on clean clothes/pajamas   If you choose to wear lotion, please use ONLY the CHG-compatible lotions on the back of this paper.   Additional instructions for the day of surgery: DO NOT APPLY any lotions, deodorants, cologne, or perfumes.   Do not bring valuables to the hospital. [facility] is not responsible for any belongings/valuables. Do not wear nail polish, gel polish, artificial nails, or any other type of covering on natural nails (fingers and toes) Do not wear jewelry or makeup Put on clean/comfortable clothes.  Please brush your teeth.  Ask your nurse before applying any prescription medications  to the skin.     CHG Compatible Lotions   Aveeno Moisturizing lotion  Cetaphil Moisturizing Cream  Cetaphil Moisturizing Lotion  Clairol Herbal Essence Moisturizing Lotion, Dry Skin  Clairol Herbal Essence Moisturizing Lotion, Extra Dry Skin  Clairol Herbal Essence Moisturizing Lotion, Normal Skin  Curel Age Defying Therapeutic Moisturizing Lotion with Alpha Hydroxy  Curel Extreme Care Body Lotion  Curel Soothing Hands Moisturizing Hand Lotion  Curel Therapeutic Moisturizing Cream, Fragrance-Free  Curel Therapeutic Moisturizing Lotion, Fragrance-Free  Curel Therapeutic Moisturizing Lotion, Original Formula  Eucerin Daily Replenishing Lotion  Eucerin Dry Skin Therapy Plus Alpha Hydroxy Crme  Eucerin Dry Skin Therapy Plus Alpha Hydroxy Lotion  Eucerin Original Crme  Eucerin Original Lotion  Eucerin Plus Crme Eucerin Plus Lotion  Eucerin TriLipid Replenishing Lotion  Keri Anti-Bacterial Hand Lotion  Keri Deep Conditioning Original Lotion Dry Skin Formula Softly Scented  Keri Deep Conditioning Original Lotion, Fragrance Free Sensitive Skin Formula  Keri Lotion Fast Absorbing Fragrance Free Sensitive Skin Formula  Keri Lotion Fast Absorbing Softly Scented Dry Skin Formula  Keri Original Lotion  Keri Skin Renewal Lotion Keri Silky Smooth Lotion  Keri Silky Smooth Sensitive Skin Lotion  Nivea Body Creamy Conditioning Oil  Nivea Body Extra Enriched Lotion  Nivea Body Original Lotion  Nivea Body Sheer Moisturizing Lotion Nivea Crme  Nivea Skin Firming Lotion  NutraDerm 30 Skin Lotion  NutraDerm Skin Lotion  NutraDerm Therapeutic Skin Cream  NutraDerm Therapeutic Skin Lotion  ProShield Protective Hand Cream  Provon moisturizing lotion  Please read over the following fact sheets that you were given.

## 2022-12-01 ENCOUNTER — Ambulatory Visit: Payer: Medicare Other

## 2022-12-01 ENCOUNTER — Encounter (HOSPITAL_COMMUNITY)
Admission: RE | Admit: 2022-12-01 | Discharge: 2022-12-01 | Disposition: A | Discharge status: Home / Self Care | Payer: Medicare Other | Source: Ambulatory Visit | Attending: Orthopedic Surgery | Admitting: Orthopedic Surgery

## 2022-12-01 ENCOUNTER — Other Ambulatory Visit: Payer: Self-pay

## 2022-12-01 ENCOUNTER — Encounter (HOSPITAL_COMMUNITY): Payer: Self-pay

## 2022-12-01 VITALS — BP 173/78 | HR 66 | Temp 98.6°F | Resp 18 | Ht 67.0 in | Wt 177.0 lb

## 2022-12-01 DIAGNOSIS — F17200 Nicotine dependence, unspecified, uncomplicated: Secondary | ICD-10-CM | POA: Insufficient documentation

## 2022-12-01 DIAGNOSIS — M5416 Radiculopathy, lumbar region: Secondary | ICD-10-CM | POA: Insufficient documentation

## 2022-12-01 DIAGNOSIS — Z01812 Encounter for preprocedural laboratory examination: Secondary | ICD-10-CM | POA: Diagnosis not present

## 2022-12-01 DIAGNOSIS — K5730 Diverticulosis of large intestine without perforation or abscess without bleeding: Secondary | ICD-10-CM | POA: Diagnosis not present

## 2022-12-01 DIAGNOSIS — E1151 Type 2 diabetes mellitus with diabetic peripheral angiopathy without gangrene: Secondary | ICD-10-CM | POA: Diagnosis not present

## 2022-12-01 DIAGNOSIS — K219 Gastro-esophageal reflux disease without esophagitis: Secondary | ICD-10-CM | POA: Diagnosis not present

## 2022-12-01 DIAGNOSIS — I2510 Atherosclerotic heart disease of native coronary artery without angina pectoris: Secondary | ICD-10-CM | POA: Insufficient documentation

## 2022-12-01 DIAGNOSIS — I447 Left bundle-branch block, unspecified: Secondary | ICD-10-CM | POA: Diagnosis not present

## 2022-12-01 DIAGNOSIS — M2041 Other hammer toe(s) (acquired), right foot: Secondary | ICD-10-CM | POA: Diagnosis not present

## 2022-12-01 DIAGNOSIS — Z01818 Encounter for other preprocedural examination: Secondary | ICD-10-CM

## 2022-12-01 DIAGNOSIS — I10 Essential (primary) hypertension: Secondary | ICD-10-CM | POA: Diagnosis not present

## 2022-12-01 DIAGNOSIS — I428 Other cardiomyopathies: Secondary | ICD-10-CM | POA: Diagnosis not present

## 2022-12-01 DIAGNOSIS — M2042 Other hammer toe(s) (acquired), left foot: Secondary | ICD-10-CM | POA: Diagnosis not present

## 2022-12-01 DIAGNOSIS — E119 Type 2 diabetes mellitus without complications: Secondary | ICD-10-CM | POA: Insufficient documentation

## 2022-12-01 DIAGNOSIS — I6521 Occlusion and stenosis of right carotid artery: Secondary | ICD-10-CM | POA: Insufficient documentation

## 2022-12-01 DIAGNOSIS — E785 Hyperlipidemia, unspecified: Secondary | ICD-10-CM | POA: Diagnosis not present

## 2022-12-01 DIAGNOSIS — G8929 Other chronic pain: Secondary | ICD-10-CM | POA: Diagnosis not present

## 2022-12-01 DIAGNOSIS — J432 Centrilobular emphysema: Secondary | ICD-10-CM | POA: Insufficient documentation

## 2022-12-01 HISTORY — DX: Cardiac arrhythmia, unspecified: I49.9

## 2022-12-01 HISTORY — DX: Gastro-esophageal reflux disease without esophagitis: K21.9

## 2022-12-01 LAB — CBC
HCT: 45.8 % (ref 36.0–46.0)
Hemoglobin: 15.4 g/dL — ABNORMAL HIGH (ref 12.0–15.0)
MCH: 32.3 pg (ref 26.0–34.0)
MCHC: 33.6 g/dL (ref 30.0–36.0)
MCV: 96 fL (ref 80.0–100.0)
Platelets: 245 10*3/uL (ref 150–400)
RBC: 4.77 MIL/uL (ref 3.87–5.11)
RDW: 12.1 % (ref 11.5–15.5)
WBC: 5.4 10*3/uL (ref 4.0–10.5)
nRBC: 0 % (ref 0.0–0.2)

## 2022-12-01 LAB — HEMOGLOBIN A1C
Hgb A1c MFr Bld: 6.4 % — ABNORMAL HIGH (ref 4.8–5.6)
Mean Plasma Glucose: 136.98 mg/dL

## 2022-12-01 LAB — BASIC METABOLIC PANEL
Anion gap: 9 (ref 5–15)
BUN: 10 mg/dL (ref 8–23)
CO2: 26 mmol/L (ref 22–32)
Calcium: 9.3 mg/dL (ref 8.9–10.3)
Chloride: 102 mmol/L (ref 98–111)
Creatinine, Ser: 0.85 mg/dL (ref 0.44–1.00)
GFR, Estimated: >60 mL/min (ref >60)
Glucose, Bld: 140 mg/dL — ABNORMAL HIGH (ref 70–99)
Potassium: 3.7 mmol/L (ref 3.5–5.1)
Sodium: 137 mmol/L (ref 135–145)

## 2022-12-01 LAB — SURGICAL PCR SCREEN
MRSA, PCR: NEGATIVE
Staphylococcus aureus: NEGATIVE

## 2022-12-01 LAB — TYPE AND SCREEN
ABO/RH(D): A POS
Antibody Screen: NEGATIVE

## 2022-12-01 LAB — GLUCOSE, CAPILLARY: Glucose-Capillary: 163 mg/dL — ABNORMAL HIGH (ref 70–99)

## 2022-12-01 NOTE — Progress Notes (Signed)
12/01/22 patient seen fit with custom inserts and DM shoes  Items fit well provide support and protection to neuropathic feet  Shoes provide extra depth and with to reduce pressure. Inserts provide support to MLA's BIL better distributing body weight and greater reducing pressure of plantar aspect of BIL feet  Patient was given wear care and break In instructions and will call office if any problems arise - [name] C.Ped, Cfo, CFm

## 2022-12-01 NOTE — Progress Notes (Signed)
PCP - Dr. Abbe [place] Cardiologist - Dr. Nanetta Batty (Last office visit 05/06/2022)  PPM/ICD - Denies Device Orders - n/a Rep Notified - n/a  Chest x-ray - 06/29/2022 EKG - 06/29/2022 Stress Test - 05/31/2015 ECHO - 12/18/2021 Cardiac Cath - 03/17/2018  Sleep Study - Denies CPAP - n/a  Pt is DM2. She checks her blood sugar every other day. Normal fasting range is 90s-100s. Pt is diet controlled. CBG at pre-op was 163. She only had some mints this morning. A1c result is pending.  Last dose of GLP1 agonist- n/a GLP1 instructions: n/a  Blood Thinner Instructions: n/a Aspirin Instructions: Pt instructed to hold ASA one week. Her last dose was 7/15.  ERAS Protcol - Clear liquids until 0430 morning of surgery PRE-SURGERY Ensure or G2- G2 given to pt with instructions  COVID TEST- n/a   Anesthesia review: Yes. Cardiac Clearance obtained.   Patient denies shortness of breath, fever, cough and chest pain at PAT appointment. Pt denies any respiratory illness/infection in the last two months.   All instructions explained to the patient, with a verbal understanding of the material. Patient agrees to go over the instructions while at home for a better understanding. Patient also instructed to self quarantine after being tested for COVID-19. The opportunity to ask questions was provided.

## 2022-12-02 NOTE — Progress Notes (Addendum)
Anesthesia Chart Review:   Case: [id number] Date/Time: 12/08/22 0715   Procedure: L3-4 L4-5 POSTERIOR INSTRUMENTED LUMBAR FUSION / LAMINECTOMY   Anesthesia type: General   Pre-op diagnosis: LUMBAR RADICULOPATHY   [facility] 05 / [facility]   Surgeons: London Sheer, MD       DISCUSSION: Patient is a 77 year old female scheduled for the above procedure.  History includes smoking, HTN, HLD, DM2, peripheral neuropathy, non-ischemic cardiomyopathy (normal coronaries, EF 40-45% 02/2022, possibly LBBB CM; LVEF 50-55% 12/2021), LBBB (since at least 01/16/15), GERD, vertigo, migraines, chronic back pain, spinal surgery (Left L3-4 microdiscectomy 06/30/13; C3-4 ACDF & C6-T1 posterior foraminotomy/arthrodesis 04/17/16), H. pylori gastritis (01/2022), duodenum AVM (s/p MR clips 02/03/22 EGD). 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  She had preoperative cardiology telephonic evaluation on 10/29/22 by Eligha Bridegroom, NP. "Preoperative Cardiovascular Risk Assessment: According to the Revised Cardiac Risk Index (RCRI), her Perioperative Risk of Major Cardiac Event is (%): 0.9. Her Functional Capacity in METs is: 7.59 according to the Duke Activity Status Index (DASI). The patient is doing well from a cardiac perspective. Therefore, based on ACC/AHA guidelines, the patient would be at acceptable risk for the planned procedure without further cardiovascular testing...  Her aspirin may be held for 7 days prior to her procedure. Please resume as soon as hemostasis is achieved." She  reported last ASA was on 11/30/22.   06/2022 CTA suggested severe proximal right CCA stenosis, although right proximal CCA PSV 83 cm/s and right proximal ICA PSV 61 cm/s by 12/2021 [place]. I did send Dr. Allyson Sabal communication regarding future follow-up recommendations.   ADDENDUM 12/03/22 4:53 PM:  Dr. Allyson Sabal communicated that he would probably get another carotid duplex to evaluate her right carotid stenosis prior to surgery. His nurse is out of the office today, so I contacted [facility] triage nurse and forwarded Dr. Hazle Coca message in hopes to get test scheduled. I attempted to contact the patient to give her an update but only got voice message. I also sent an update to Dr. Christell Constant.   ADDENDUM 12/07/22 2:21 PM: Dr. Hazle Coca office was able to schedule her a carotid US visit this afternoon. I was able to call her and tell her why a repeat US was ordered. Preliminary results (see Results/CV below) reviewed by Dr. Allyson Sabal and thought to be "Essentially normal study. Repeat when clinically indicated." Will send update to Dr. Christell Constant. Anesthesia team to evaluate on the day of surgery.    VS: BP (!) 173/78   Pulse 66   Temp 37 C   Resp 18   Ht 5\' 7"  (1.702 m)   Wt 80.3 kg   SpO2 100%   BMI 27.72 kg/m    PROVIDERS: [name], MD is PCP  Nanetta Batty, MD is cardiologist Doristine Locks, DO is GI   LABS: Labs reviewed: Acceptable for surgery. (all labs ordered are listed, but only abnormal results are displayed)  Labs Reviewed  GLUCOSE, CAPILLARY - Abnormal; Notable for the following components:      Result Value   Glucose-Capillary 163 (*)    All other components within normal limits  CBC - Abnormal; Notable  for the following components:   Hemoglobin 15.4 (*)    All other components within normal limits  BASIC METABOLIC PANEL - Abnormal; Notable for the following components:   Glucose, Bld 140 (*)    All other components within normal limits  HEMOGLOBIN A1C - Abnormal; Notable for the  following components:   Hgb A1c MFr Bld 6.4 (*)    All other components within normal limits  SURGICAL PCR SCREEN  TYPE AND SCREEN     IMAGES: CTA Chest/abd/pelvis 06/29/22: IMPRESSION: 1. No evidence of thoracoabdominal aortic aneurysm or dissection. 2. High-grade stenosis of the proximal right common carotid artery. Correlation with carotid artery duplex sonography may be helpful for further evaluation. 3. Mild coronary artery calcification. 4. Mild centrilobular and paraseptal emphysema. 5. 3 mm sub solid pulmonary nodule within the right middle lobe. No follow-up recommended. This recommendation follows the consensus statement: Guidelines for Management of Incidental Pulmonary Nodules Detected on CT Images: From the Fleischner Society 2017; Radiology 2017; 284:228-243. 6. Severe sigmoid diverticulosis without superimposed acute inflammatory change. 7. Greater than 50% ostial stenoses of the main renal arteries bilaterally. Superimposed changes of fibromuscular dysplasia. 8. 50% stenosis of the celiac artery origin related to mass effect from the adjacent median arcuate ligament. - See CV below for Summary of most recent Carotid US results.   CXR 06/29/22: IMPRESSION: 1. No active cardiopulmonary disease. 2. Cardiomegaly.   EKG: 07/01/22:  Sinus rhythm  Borderline prolonged PR interval Left bundle branch block Confirmed by Benjiman Core [phone]) on 06/30/2022 7:12:39 PM - SB at 52 bpm, LBBB, PR 214 ms   CV: US Carotid 12/07/22 (Preliminary): Summary:  - Right Carotid: Velocities in the right ICA are consistent with a 1-39%  stenosis.  [right prox CCA PSV 87 cm/s, EDV 8 cm/s] - Left Carotid: Velocities in the left ICA are consistent with a 1-39%  stenosis.  [left prox CCA PSV 58 cm/s, EDV 9 cm/s] - Vertebrals: Bilateral vertebral arteries demonstrate antegrade flow.  - Subclavians: Right subclavian artery flow was disturbed. Normal flow  hemodynamics were seen  in the left subclavian artery.  - Dr. Allyson Sabal reviewed, "Essentially normal study. Repeat when clinically indicated." Comparison Duplex US: 1-39% BICA with right prox CCA PSV 83 cm/s, EDV 17 cm/s & left prox CCA PSV 64 cm/s, EDV 13 cm/s 12/18/21 & 07/25/18;  60-79% BICA 09/30/17 at [facility]   Echo 12/18/21: IMPRESSIONS   1. Left ventricular ejection fraction, by estimation, is 50 to 55%. The  left ventricle has low normal function. The left ventricle has no regional  wall motion abnormalities. Left ventricular diastolic parameters are  consistent with Grade I diastolic  dysfunction (impaired relaxation).   2. Right ventricular systolic function is normal. The right ventricular  size is normal.   3. Left atrial size was mildly dilated.   4. The mitral valve is normal in structure. Mild mitral valve  regurgitation. No evidence of mitral stenosis.   5. The aortic valve is tricuspid. Aortic valve regurgitation is mild.  Aortic valve sclerosis/calcification is present, without any evidence of  aortic stenosis.   6. Aortic dilatation noted. There is mild dilatation of the ascending  aorta, measuring 40 mm.   7. The inferior vena cava is normal in size with greater than 50%  respiratory variability, suggesting right atrial pressure of 3 mmHg.    MRI Cardiac 06/23/18: IMPRESSION: 1.  Normal LV size with EF 47%, septal-lateral dyssynchrony. 2.  Normal RV size and systolic function. 3. No  myocardial LGE, so no evidence for prior MI, infiltrative disease, or myocarditis. - Possible LBBB cardiomyopathy.    Cardiac cath 03/17/18 (Dr. Allyson Sabal; done for 03/04/18 LAD FFR 0.78 concerning for significant stenosis): - There is mild to moderate left ventricular systolic dysfunction. - LV end diastolic pressure is mildly elevated. - The left ventricular ejection fraction is 35-45% by visual estimate. IMPRESSION:  Ms. Seman has essentially normal coronary arteries and mild to moderate LV dysfunction.  Her  EF is in the 40 to 45% range.  I believe her chest pain is noncardiac and a CT FFR was falsely positive.     Past Medical History:  Diagnosis Date   Arthritis    back, knees, shoulder, hands , injections in pelvis, for bone spurs (05/04/2016)   Chronic back pain    Diverticulosis    Dysrhythmia    LBBB   GERD (gastroesophageal reflux disease)    History of kidney stones    HLD (hyperlipidemia)    Hypertension    Migraine    hx (05/04/2016)   Peripheral neuropathy    Radiculopathy    Sinus complaint    Sinus headache    occasional (05/04/2016)   Type II diabetes mellitus (HCC)    diet controlled, no meds now, was on insulin & po treatment at one time     Vertigo     Past Surgical History:  Procedure Laterality Date   ANTERIOR CERVICAL DECOMP/DISCECTOMY FUSION N/A 04/17/2016   Procedure: ANTERIOR CERVICAL DECOMPRESSION/DISCECTOMY FUSION CERVICAL THREE - CERVICAL FOUR, POSTERIOR CERVICAL DISCECTOMY CERVICAL SEVEN -THORASIC ONE LEFT;  Surgeon: Coletta Memos, MD;  Location: [facility];  Service: Neurosurgery;  Laterality: N/A;  Anterior/Posterior   ANTERIOR FUSION CERVICAL SPINE  2000   Dr. Channing Mutters; "for bone spurs; put hardware in too"   COLONOSCOPY  06/10/2016   [place]   COLONOSCOPY  2023   LASIK Bilateral    LEFT HEART CATH AND CORONARY ANGIOGRAPHY N/A 03/17/2018   Procedure: LEFT HEART CATH AND CORONARY ANGIOGRAPHY;  Surgeon: Runell Gess, MD;  Location: [facility] INV[facility];  Service: Cardiovascular;  Laterality: N/A;   LUMBAR LAMINECTOMY Left 06/30/2013   Procedure: Left L3-4 Extraforaminal approach to excise far lateral herniated nucleus pulposus;  Surgeon: Kerrin Champagne, MD;  Location: [facility] OR;  Service: Orthopedics;  Laterality: Left;   TUBAL LIGATION     VAGINAL HYSTERECTOMY      MEDICATIONS:  amLODipine (NORVASC) 5 MG tablet   aspirin 81 MG EC tablet   Aspirin-Acetaminophen-Caffeine (GOODY HEADACHE PO)   blood glucose meter kit and supplies KIT   Blood Glucose Monitoring  Suppl DEVI   Calcium Carb-Cholecalciferol (CALCIUM 500 + D PO)   celecoxib (CELEBREX) 200 MG capsule   clobetasol ointment (TEMOVATE) 0.05 %   ferrous sulfate 325 (65 FE) MG EC tablet   Glucose Blood (BLOOD GLUCOSE TEST STRIPS) STRP   Lancets Misc. MISC   loratadine (CLARITIN) 10 MG tablet   mesalamine (LIALDA) 1.2 g EC tablet   Multiple Vitamins-Minerals (CENTRUM ADULTS PO)   NON FORMULARY   olmesartan (BENICAR) 5 MG tablet   Omega-3 Fatty Acids (FISH OIL PO)   oxyCODONE (ROXICODONE) 5 MG immediate release tablet   pantoprazole (PROTONIX) 40 MG tablet   potassium chloride SA (KLOR-CON M) 20 MEQ tablet   PREDNISOLON-MOXIFLOX-BROMFENAC OP   pregabalin (LYRICA) 75 MG capsule   rosuvastatin (CRESTOR) 20 MG tablet   sucralfate (CARAFATE) 1 g tablet   triamcinolone cream (KENALOG) 0.5 %   vitamin B-12 (  CYANOCOBALAMIN) 1000 MCG tablet   No current facility-administered medications for this encounter.    Shonna Chock, PA-C Surgical Short Stay/Anesthesiology [facility] Phone [phone] [facility] Phone [phone] 12/02/2022 6:27 PM

## 2022-12-03 ENCOUNTER — Other Ambulatory Visit: Payer: Medicare Other

## 2022-12-03 ENCOUNTER — Telehealth: Payer: Self-pay

## 2022-12-03 DIAGNOSIS — I6523 Occlusion and stenosis of bilateral carotid arteries: Secondary | ICD-10-CM

## 2022-12-03 NOTE — Telephone Encounter (Signed)
She is having a surgery on 7/23 and a repeat doppler was supposed to be ordered. He states Dr Allyson Sabal did forward to [place] but she can tell she is out. Order oplaced

## 2022-12-07 ENCOUNTER — Ambulatory Visit (HOSPITAL_COMMUNITY)
Admission: RE | Admit: 2022-12-07 | Discharge: 2022-12-07 | Disposition: A | Discharge status: Home / Self Care | Payer: Medicare Other | Source: Ambulatory Visit | Attending: Cardiovascular Disease | Admitting: Cardiovascular Disease

## 2022-12-07 DIAGNOSIS — I10 Essential (primary) hypertension: Secondary | ICD-10-CM | POA: Diagnosis not present

## 2022-12-07 DIAGNOSIS — J449 Chronic obstructive pulmonary disease, unspecified: Secondary | ICD-10-CM | POA: Diagnosis not present

## 2022-12-07 DIAGNOSIS — M48061 Spinal stenosis, lumbar region without neurogenic claudication: Secondary | ICD-10-CM | POA: Diagnosis not present

## 2022-12-07 DIAGNOSIS — M4316 Spondylolisthesis, lumbar region: Secondary | ICD-10-CM | POA: Diagnosis not present

## 2022-12-07 DIAGNOSIS — Z882 Allergy status to sulfonamides status: Secondary | ICD-10-CM | POA: Diagnosis not present

## 2022-12-07 DIAGNOSIS — I6523 Occlusion and stenosis of bilateral carotid arteries: Secondary | ICD-10-CM | POA: Insufficient documentation

## 2022-12-07 DIAGNOSIS — Z981 Arthrodesis status: Secondary | ICD-10-CM | POA: Diagnosis not present

## 2022-12-07 DIAGNOSIS — M4726 Other spondylosis with radiculopathy, lumbar region: Secondary | ICD-10-CM | POA: Diagnosis not present

## 2022-12-07 DIAGNOSIS — K219 Gastro-esophageal reflux disease without esophagitis: Secondary | ICD-10-CM | POA: Diagnosis not present

## 2022-12-07 DIAGNOSIS — Z881 Allergy status to other antibiotic agents status: Secondary | ICD-10-CM | POA: Diagnosis not present

## 2022-12-07 DIAGNOSIS — E785 Hyperlipidemia, unspecified: Secondary | ICD-10-CM | POA: Diagnosis not present

## 2022-12-07 DIAGNOSIS — E119 Type 2 diabetes mellitus without complications: Secondary | ICD-10-CM | POA: Diagnosis not present

## 2022-12-07 DIAGNOSIS — Z87891 Personal history of nicotine dependence: Secondary | ICD-10-CM | POA: Diagnosis not present

## 2022-12-07 NOTE — Telephone Encounter (Signed)
Pt is coming today for carotid doppler.

## 2022-12-08 ENCOUNTER — Inpatient Hospital Stay (HOSPITAL_COMMUNITY)
Admission: AD | Admit: 2022-12-08 | Discharge: 2022-12-13 | DRG: 460 | Disposition: A | Discharge status: Home / Self Care | Payer: Medicare Other | Attending: Orthopedic Surgery | Admitting: Orthopedic Surgery

## 2022-12-08 ENCOUNTER — Other Ambulatory Visit: Payer: Self-pay

## 2022-12-08 ENCOUNTER — Ambulatory Visit (HOSPITAL_COMMUNITY): Payer: Medicare Other

## 2022-12-08 ENCOUNTER — Ambulatory Visit (HOSPITAL_COMMUNITY): Payer: Medicare Other | Admitting: Vascular Surgery

## 2022-12-08 ENCOUNTER — Ambulatory Visit (HOSPITAL_COMMUNITY): Payer: Medicare Other | Admitting: Registered Nurse

## 2022-12-08 ENCOUNTER — Encounter (HOSPITAL_COMMUNITY): Payer: Self-pay | Admitting: Orthopedic Surgery

## 2022-12-08 ENCOUNTER — Encounter (HOSPITAL_COMMUNITY)
Admission: AD | Disposition: A | Discharge status: Home / Self Care | Payer: Self-pay | Source: Home / Self Care | Attending: Orthopedic Surgery

## 2022-12-08 DIAGNOSIS — Z881 Allergy status to other antibiotic agents status: Secondary | ICD-10-CM

## 2022-12-08 DIAGNOSIS — M4726 Other spondylosis with radiculopathy, lumbar region: Principal | ICD-10-CM | POA: Diagnosis present

## 2022-12-08 DIAGNOSIS — I6523 Occlusion and stenosis of bilateral carotid arteries: Secondary | ICD-10-CM | POA: Diagnosis present

## 2022-12-08 DIAGNOSIS — M47816 Spondylosis without myelopathy or radiculopathy, lumbar region: Secondary | ICD-10-CM | POA: Diagnosis not present

## 2022-12-08 DIAGNOSIS — I10 Essential (primary) hypertension: Secondary | ICD-10-CM | POA: Diagnosis present

## 2022-12-08 DIAGNOSIS — J449 Chronic obstructive pulmonary disease, unspecified: Secondary | ICD-10-CM | POA: Diagnosis not present

## 2022-12-08 DIAGNOSIS — M48061 Spinal stenosis, lumbar region without neurogenic claudication: Secondary | ICD-10-CM | POA: Diagnosis not present

## 2022-12-08 DIAGNOSIS — Z87891 Personal history of nicotine dependence: Secondary | ICD-10-CM | POA: Diagnosis not present

## 2022-12-08 DIAGNOSIS — E119 Type 2 diabetes mellitus without complications: Secondary | ICD-10-CM

## 2022-12-08 DIAGNOSIS — I2510 Atherosclerotic heart disease of native coronary artery without angina pectoris: Secondary | ICD-10-CM

## 2022-12-08 DIAGNOSIS — E785 Hyperlipidemia, unspecified: Secondary | ICD-10-CM | POA: Diagnosis present

## 2022-12-08 DIAGNOSIS — M5126 Other intervertebral disc displacement, lumbar region: Secondary | ICD-10-CM | POA: Diagnosis not present

## 2022-12-08 DIAGNOSIS — M5416 Radiculopathy, lumbar region: Secondary | ICD-10-CM | POA: Diagnosis not present

## 2022-12-08 DIAGNOSIS — Z90710 Acquired absence of both cervix and uterus: Secondary | ICD-10-CM

## 2022-12-08 DIAGNOSIS — K219 Gastro-esophageal reflux disease without esophagitis: Secondary | ICD-10-CM | POA: Diagnosis present

## 2022-12-08 DIAGNOSIS — Z981 Arthrodesis status: Secondary | ICD-10-CM

## 2022-12-08 DIAGNOSIS — M1612 Unilateral primary osteoarthritis, left hip: Secondary | ICD-10-CM | POA: Diagnosis not present

## 2022-12-08 DIAGNOSIS — Z882 Allergy status to sulfonamides status: Secondary | ICD-10-CM

## 2022-12-08 DIAGNOSIS — M4316 Spondylolisthesis, lumbar region: Secondary | ICD-10-CM

## 2022-12-08 HISTORY — PX: BACK SURGERY: SHX140

## 2022-12-08 LAB — GLUCOSE, CAPILLARY
Glucose-Capillary: 110 mg/dL — ABNORMAL HIGH (ref 70–99)
Glucose-Capillary: 105 mg/dL — ABNORMAL HIGH (ref 70–99)
Glucose-Capillary: 108 mg/dL — ABNORMAL HIGH (ref 70–99)
Glucose-Capillary: 129 mg/dL — ABNORMAL HIGH (ref 70–99)
Glucose-Capillary: 132 mg/dL — ABNORMAL HIGH (ref 70–99)

## 2022-12-08 LAB — ABO/RH: ABO/RH(D): A POS

## 2022-12-08 SURGERY — POSTERIOR LUMBAR FUSION 2 LEVEL
Anesthesia: General | Site: Spine Lumbar

## 2022-12-08 MED ORDER — ACETAMINOPHEN 500 MG PO TABS: 1000.0000 mg | ORAL_TABLET | Freq: Three times a day (TID) | ORAL | Status: DC

## 2022-12-08 MED ORDER — LACTATED RINGERS IV SOLN: INTRAVENOUS | Status: DC | PRN

## 2022-12-08 MED ORDER — THROMBIN 20000 UNITS EX SOLR
CUTANEOUS | Status: AC
  Filled 2022-12-08: qty 20000

## 2022-12-08 MED ORDER — FENTANYL CITRATE (PF) 100 MCG/2ML IJ SOLN
INTRAMUSCULAR | Status: AC
  Filled 2022-12-08: qty 2

## 2022-12-08 MED ORDER — MORPHINE SULFATE (PF) 2 MG/ML IV SOLN
2.0000 mg | INTRAVENOUS | Status: DC | PRN
  Administered 2022-12-08 – 2022-12-09 (×5): 4 mg via INTRAVENOUS
  Filled 2022-12-08 (×5): qty 2

## 2022-12-08 MED ORDER — SENNA 8.6 MG PO TABS
1.0000 | ORAL_TABLET | Freq: Two times a day (BID) | ORAL | Status: DC
  Administered 2022-12-09 – 2022-12-13 (×6): 8.6 mg via ORAL
  Filled 2022-12-08 (×7): qty 1

## 2022-12-08 MED ORDER — OXYCODONE HCL 5 MG PO TABS
5.0000 mg | ORAL_TABLET | ORAL | Status: DC | PRN
  Administered 2022-12-08 – 2022-12-09 (×3): 10 mg via ORAL
  Filled 2022-12-08 (×3): qty 2

## 2022-12-08 MED ORDER — SODIUM CHLORIDE 0.9 % IV SOLN
0.1000 ug/kg/min | INTRAVENOUS | Status: AC
  Administered 2022-12-08: .1 ug/kg/min via INTRAVENOUS
  Filled 2022-12-08: qty 2000

## 2022-12-08 MED ORDER — MORPHINE SULFATE 15 MG PO TABS: 7.5000 mg | ORAL_TABLET | ORAL | Status: DC | PRN

## 2022-12-08 MED ORDER — ROSUVASTATIN CALCIUM 20 MG PO TABS
20.0000 mg | ORAL_TABLET | Freq: Every day | ORAL | Status: DC
  Administered 2022-12-08 – 2022-12-13 (×6): 20 mg via ORAL
  Filled 2022-12-08 (×6): qty 1

## 2022-12-08 MED ORDER — LIDOCAINE 2% (20 MG/ML) 5 ML SYRINGE
INTRAMUSCULAR | Status: DC | PRN
  Administered 2022-12-08: 50 mg via INTRAVENOUS

## 2022-12-08 MED ORDER — SODIUM CHLORIDE 0.9 % IV SOLN
0.1000 ug/kg/min | INTRAVENOUS | Status: DC
  Filled 2022-12-08: qty 2000

## 2022-12-08 MED ORDER — PANTOPRAZOLE SODIUM 40 MG PO TBEC
40.0000 mg | DELAYED_RELEASE_TABLET | Freq: Two times a day (BID) | ORAL | Status: DC
  Administered 2022-12-08 – 2022-12-13 (×10): 40 mg via ORAL
  Filled 2022-12-08 (×10): qty 1

## 2022-12-08 MED ORDER — ADULT MULTIVITAMIN W/MINERALS CH
ORAL_TABLET | Freq: Every day | ORAL | Status: DC
  Administered 2022-12-09 – 2022-12-13 (×5): 1 via ORAL
  Filled 2022-12-08 (×5): qty 1

## 2022-12-08 MED ORDER — ACETAMINOPHEN 10 MG/ML IV SOLN: 1000.0000 mg | Freq: Once | INTRAVENOUS | Status: DC | PRN

## 2022-12-08 MED ORDER — DEXAMETHASONE SODIUM PHOSPHATE 10 MG/ML IJ SOLN: 10.0000 mg | Freq: Once | INTRAMUSCULAR | Status: DC

## 2022-12-08 MED ORDER — VANCOMYCIN HCL 1000 MG IV SOLR
INTRAVENOUS | Status: AC
  Filled 2022-12-08: qty 20

## 2022-12-08 MED ORDER — MORPHINE SULFATE (PF) 2 MG/ML IV SOLN
1.0000 mg | INTRAVENOUS | Status: DC | PRN
  Administered 2022-12-08 (×2): 1 mg via INTRAVENOUS
  Filled 2022-12-08 (×2): qty 1

## 2022-12-08 MED ORDER — THROMBIN 20000 UNITS EX SOLR: CUTANEOUS | Status: DC | PRN

## 2022-12-08 MED ORDER — MESALAMINE 1.2 G PO TBEC
4.8000 g | DELAYED_RELEASE_TABLET | Freq: Every day | ORAL | Status: DC
  Administered 2022-12-09 – 2022-12-13 (×5): 4.8 g via ORAL
  Filled 2022-12-08 (×5): qty 4

## 2022-12-08 MED ORDER — DEXMEDETOMIDINE HCL IN NACL 80 MCG/20ML IV SOLN
INTRAVENOUS | Status: DC | PRN
  Administered 2022-12-08 (×2): 8 ug via INTRAVENOUS

## 2022-12-08 MED ORDER — POVIDONE-IODINE 10 % EX SWAB
2.0000 | Freq: Once | CUTANEOUS | Status: AC
  Administered 2022-12-08: 2 via TOPICAL

## 2022-12-08 MED ORDER — ONDANSETRON HCL 4 MG/2ML IJ SOLN
INTRAMUSCULAR | Status: DC | PRN
  Administered 2022-12-08: 4 mg via INTRAVENOUS

## 2022-12-08 MED ORDER — METHOCARBAMOL 1000 MG/10ML IJ SOLN
500.0000 mg | Freq: Once | INTRAVENOUS | Status: AC
  Administered 2022-12-08: 500 mg via INTRAVENOUS
  Filled 2022-12-08: qty 500

## 2022-12-08 MED ORDER — OYSTER SHELL CALCIUM/D3 500-5 MG-MCG PO TABS
1.0000 | ORAL_TABLET | Freq: Every day | ORAL | Status: DC
  Administered 2022-12-09 – 2022-12-13 (×5): 1 via ORAL
  Filled 2022-12-08 (×5): qty 1

## 2022-12-08 MED ORDER — TRANEXAMIC ACID-NACL 1000-0.7 MG/100ML-% IV SOLN
1000.0000 mg | Freq: Once | INTRAVENOUS | Status: AC
  Administered 2022-12-08: 1000 mg via INTRAVENOUS
  Filled 2022-12-08: qty 100

## 2022-12-08 MED ORDER — FENTANYL CITRATE (PF) 250 MCG/5ML IJ SOLN
INTRAMUSCULAR | Status: DC | PRN
  Administered 2022-12-08 (×5): 50 ug via INTRAVENOUS

## 2022-12-08 MED ORDER — CEFAZOLIN SODIUM-DEXTROSE 2-4 GM/100ML-% IV SOLN
2.0000 g | INTRAVENOUS | Status: AC
  Administered 2022-12-08 (×2): 2 g via INTRAVENOUS
  Filled 2022-12-08: qty 100

## 2022-12-08 MED ORDER — ACETAMINOPHEN 10 MG/ML IV SOLN
INTRAVENOUS | Status: AC
  Filled 2022-12-08: qty 100

## 2022-12-08 MED ORDER — INSULIN ASPART 100 UNIT/ML IJ SOLN
0.0000 [IU] | Freq: Every day | INTRAMUSCULAR | Status: DC
  Administered 2022-12-11: 2 [IU] via SUBCUTANEOUS

## 2022-12-08 MED ORDER — FERROUS SULFATE 325 (65 FE) MG PO TABS
325.0000 mg | ORAL_TABLET | Freq: Every day | ORAL | Status: DC
  Administered 2022-12-09 – 2022-12-13 (×5): 325 mg via ORAL
  Filled 2022-12-08 (×5): qty 1

## 2022-12-08 MED ORDER — PHENYLEPHRINE HCL-NACL 20-0.9 MG/250ML-% IV SOLN
INTRAVENOUS | Status: DC | PRN
  Administered 2022-12-08: 80 ug via INTRAVENOUS
  Administered 2022-12-08: 40 ug/min via INTRAVENOUS

## 2022-12-08 MED ORDER — IRBESARTAN 75 MG PO TABS
37.5000 mg | ORAL_TABLET | Freq: Every day | ORAL | Status: DC
  Administered 2022-12-10 – 2022-12-13 (×4): 37.5 mg via ORAL
  Filled 2022-12-08 (×4): qty 0.5

## 2022-12-08 MED ORDER — PHENYLEPHRINE 80 MCG/ML (10ML) SYRINGE FOR IV PUSH (FOR BLOOD PRESSURE SUPPORT)
PREFILLED_SYRINGE | INTRAVENOUS | Status: DC | PRN
  Administered 2022-12-08: 80 ug via INTRAVENOUS

## 2022-12-08 MED ORDER — KETAMINE HCL 50 MG/5ML IJ SOSY
PREFILLED_SYRINGE | INTRAMUSCULAR | Status: AC
  Filled 2022-12-08: qty 5

## 2022-12-08 MED ORDER — CEFAZOLIN SODIUM-DEXTROSE 2-4 GM/100ML-% IV SOLN
2.0000 g | Freq: Four times a day (QID) | INTRAVENOUS | Status: AC
  Administered 2022-12-08 – 2022-12-09 (×3): 2 g via INTRAVENOUS
  Filled 2022-12-08 (×3): qty 100

## 2022-12-08 MED ORDER — VANCOMYCIN HCL 1000 MG IV SOLR
INTRAVENOUS | Status: DC | PRN
  Administered 2022-12-08: 1000 mg via TOPICAL

## 2022-12-08 MED ORDER — PROPOFOL 1000 MG/100ML IV EMUL
INTRAVENOUS | Status: AC
  Filled 2022-12-08: qty 200

## 2022-12-08 MED ORDER — METHOCARBAMOL 500 MG PO TABS
500.0000 mg | ORAL_TABLET | Freq: Four times a day (QID) | ORAL | Status: DC
  Administered 2022-12-09 – 2022-12-13 (×17): 500 mg via ORAL
  Filled 2022-12-08 (×19): qty 1

## 2022-12-08 MED ORDER — FENTANYL CITRATE (PF) 100 MCG/2ML IJ SOLN
25.0000 ug | INTRAMUSCULAR | Status: DC | PRN
  Administered 2022-12-08: 25 ug via INTRAVENOUS
  Administered 2022-12-08: 50 ug via INTRAVENOUS
  Administered 2022-12-08: 25 ug via INTRAVENOUS

## 2022-12-08 MED ORDER — POTASSIUM CHLORIDE CRYS ER 20 MEQ PO TBCR
20.0000 meq | EXTENDED_RELEASE_TABLET | Freq: Every day | ORAL | Status: DC
  Administered 2022-12-09 – 2022-12-13 (×5): 20 meq via ORAL
  Filled 2022-12-08 (×5): qty 1

## 2022-12-08 MED ORDER — SUGAMMADEX SODIUM 200 MG/2ML IV SOLN
INTRAVENOUS | Status: DC | PRN
  Administered 2022-12-08: 150 mg via INTRAVENOUS

## 2022-12-08 MED ORDER — ONDANSETRON HCL 4 MG/2ML IJ SOLN
4.0000 mg | Freq: Four times a day (QID) | INTRAMUSCULAR | Status: DC | PRN
  Administered 2022-12-09: 4 mg via INTRAVENOUS
  Filled 2022-12-08: qty 2

## 2022-12-08 MED ORDER — BUPIVACAINE-EPINEPHRINE (PF) 0.25% -1:200000 IJ SOLN
INTRAMUSCULAR | Status: AC
  Filled 2022-12-08: qty 30

## 2022-12-08 MED ORDER — 0.9 % SODIUM CHLORIDE (POUR BTL) OPTIME
TOPICAL | Status: DC | PRN
  Administered 2022-12-08: 1000 mL

## 2022-12-08 MED ORDER — LACTATED RINGERS IV SOLN: INTRAVENOUS | Status: DC

## 2022-12-08 MED ORDER — VITAMIN B-12 1000 MCG PO TABS
1000.0000 ug | ORAL_TABLET | Freq: Every day | ORAL | Status: DC
  Administered 2022-12-09 – 2022-12-13 (×5): 1000 ug via ORAL
  Filled 2022-12-08 (×5): qty 1

## 2022-12-08 MED ORDER — LORATADINE 10 MG PO TABS
10.0000 mg | ORAL_TABLET | Freq: Every day | ORAL | Status: DC | PRN
  Administered 2022-12-11: 10 mg via ORAL
  Filled 2022-12-08: qty 1

## 2022-12-08 MED ORDER — MIDAZOLAM HCL 2 MG/2ML IJ SOLN
INTRAMUSCULAR | Status: AC
  Filled 2022-12-08: qty 2

## 2022-12-08 MED ORDER — ORAL CARE MOUTH RINSE: 15.0000 mL | Freq: Once | OROMUCOSAL | Status: AC

## 2022-12-08 MED ORDER — ALBUMIN HUMAN 5 % IV SOLN: INTRAVENOUS | Status: DC | PRN

## 2022-12-08 MED ORDER — CHLORHEXIDINE GLUCONATE 0.12 % MT SOLN
15.0000 mL | Freq: Once | OROMUCOSAL | Status: AC
  Administered 2022-12-08: 15 mL via OROMUCOSAL
  Filled 2022-12-08: qty 15

## 2022-12-08 MED ORDER — PROPOFOL 10 MG/ML IV BOLUS
INTRAVENOUS | Status: DC | PRN
  Administered 2022-12-08: 120 ug/kg/min via INTRAVENOUS
  Administered 2022-12-08: 150 mg via INTRAVENOUS
  Administered 2022-12-08 (×2): 50 mg via INTRAVENOUS

## 2022-12-08 MED ORDER — ONDANSETRON HCL 4 MG PO TABS: 4.0000 mg | ORAL_TABLET | Freq: Four times a day (QID) | ORAL | Status: DC | PRN

## 2022-12-08 MED ORDER — MIDAZOLAM HCL 2 MG/2ML IJ SOLN
INTRAMUSCULAR | Status: DC | PRN
  Administered 2022-12-08: 1 mg via INTRAVENOUS

## 2022-12-08 MED ORDER — SUCRALFATE 1 G PO TABS
1.0000 g | ORAL_TABLET | Freq: Three times a day (TID) | ORAL | Status: DC
  Administered 2022-12-09 – 2022-12-13 (×15): 1 g via ORAL
  Filled 2022-12-08 (×15): qty 1

## 2022-12-08 MED ORDER — POLYETHYLENE GLYCOL 3350 17 G PO PACK
17.0000 g | PACK | Freq: Two times a day (BID) | ORAL | Status: DC
  Administered 2022-12-09 – 2022-12-13 (×5): 17 g via ORAL
  Filled 2022-12-08 (×7): qty 1

## 2022-12-08 MED ORDER — SUCCINYLCHOLINE CHLORIDE 200 MG/10ML IV SOSY
PREFILLED_SYRINGE | INTRAVENOUS | Status: DC | PRN
  Administered 2022-12-08: 100 mg via INTRAVENOUS

## 2022-12-08 MED ORDER — INSULIN ASPART 100 UNIT/ML IJ SOLN
0.0000 [IU] | Freq: Three times a day (TID) | INTRAMUSCULAR | Status: DC
  Administered 2022-12-09: 2 [IU] via SUBCUTANEOUS
  Administered 2022-12-11: 3 [IU] via SUBCUTANEOUS
  Administered 2022-12-11: 5 [IU] via SUBCUTANEOUS
  Administered 2022-12-12: 3 [IU] via SUBCUTANEOUS
  Administered 2022-12-12: 2 [IU] via SUBCUTANEOUS
  Administered 2022-12-12: 3 [IU] via SUBCUTANEOUS
  Administered 2022-12-13: 2 [IU] via SUBCUTANEOUS

## 2022-12-08 MED ORDER — TRANEXAMIC ACID-NACL 1000-0.7 MG/100ML-% IV SOLN
1000.0000 mg | INTRAVENOUS | Status: AC
  Administered 2022-12-08: 1000 mg via INTRAVENOUS
  Filled 2022-12-08: qty 100

## 2022-12-08 MED ORDER — AMLODIPINE BESYLATE 5 MG PO TABS
5.0000 mg | ORAL_TABLET | Freq: Every day | ORAL | Status: DC
  Administered 2022-12-09 – 2022-12-13 (×5): 5 mg via ORAL
  Filled 2022-12-08 (×5): qty 1

## 2022-12-08 MED ORDER — ROCURONIUM BROMIDE 10 MG/ML (PF) SYRINGE
PREFILLED_SYRINGE | INTRAVENOUS | Status: DC | PRN
  Administered 2022-12-08: 30 mg via INTRAVENOUS

## 2022-12-08 MED ORDER — FENTANYL CITRATE (PF) 250 MCG/5ML IJ SOLN
INTRAMUSCULAR | Status: AC
  Filled 2022-12-08: qty 5

## 2022-12-08 SURGICAL SUPPLY — 67 items
APL SKNCLS STERI-STRIP NONHPOA (GAUZE/BANDAGES/DRESSINGS) ×1
BENZOIN TINCTURE PRP APPL 2/3 (GAUZE/BANDAGES/DRESSINGS) ×1 IMPLANT
BONE FIBERS PLIAFX 10 (Bone Implant) ×1 IMPLANT
BUR NEURO DRILL SOFT 3.0X3.8M (BURR) ×1 IMPLANT
CANISTER SUCT 3000ML PPV (MISCELLANEOUS) ×1 IMPLANT
CLIP NEUROVISION LG (NEUROSURGERY SUPPLIES) IMPLANT
CLSR STERI-STRIP ANTIMIC 1/2X4 (GAUZE/BANDAGES/DRESSINGS) ×1 IMPLANT
CORD BIPOLAR FORCEPS 12FT (ELECTRODE) IMPLANT
COVER SURGICAL LIGHT HANDLE (MISCELLANEOUS) ×1 IMPLANT
DRAIN HEMOVAC 7FR (DRAIN) ×2 IMPLANT
DRAPE C-ARM 42X72 X-RAY (DRAPES) ×1 IMPLANT
DRAPE C-ARMOR (DRAPES) IMPLANT
DRAPE MICROSCOPE LEICA 54X105 (DRAPES) ×1 IMPLANT
DRAPE UTILITY XL STRL (DRAPES) ×2 IMPLANT
DRESSING MEPILEX FLEX 4X4 (GAUZE/BANDAGES/DRESSINGS) ×1 IMPLANT
DRSG MEPILEX FLEX 4X4 (GAUZE/BANDAGES/DRESSINGS)
DRSG MEPILEX POST OP 4X8 (GAUZE/BANDAGES/DRESSINGS) IMPLANT
DRSG TEGADERM 4X10 (GAUZE/BANDAGES/DRESSINGS) ×1 IMPLANT
DRSG TEGADERM 4X4.75 (GAUZE/BANDAGES/DRESSINGS) IMPLANT
DURAPREP 26ML APPLICATOR (WOUND CARE) ×1 IMPLANT
ELECT COATED BLADE 2.86 ST (ELECTRODE) ×1 IMPLANT
ELECT NVM5 SURFACE MEP/EMG (ELECTRODE) IMPLANT
ELECT PENCIL ROCKER SW 15FT (MISCELLANEOUS) ×1 IMPLANT
ELECT REM PT RETURN 9FT ADLT (ELECTROSURGICAL) ×1
ELECTRODE REM PT RTRN 9FT ADLT (ELECTROSURGICAL) ×1 IMPLANT
EVACUATOR 1/8 PVC DRAIN (DRAIN) IMPLANT
GAUZE SPONGE 4X4 12PLY STRL (GAUZE/BANDAGES/DRESSINGS) ×1 IMPLANT
GLOVE BIO SURGEON STRL SZ7.5 (GLOVE) ×1 IMPLANT
GLOVE BIOGEL PI IND STRL 7.5 (GLOVE) ×1 IMPLANT
GLOVE INDICATOR 8.0 STRL GRN (GLOVE) ×1 IMPLANT
GOWN STRL REUS W/ TWL XL LVL3 (GOWN DISPOSABLE) ×1 IMPLANT
GOWN STRL REUS W/TWL XL LVL3 (GOWN DISPOSABLE) ×1
GRAFT BNE FBR PLIAFX PRIME 10 (Bone Implant) IMPLANT
KIT BASIN OR (CUSTOM PROCEDURE TRAY) ×1 IMPLANT
KIT POSITION SURG JACKSON T1 (MISCELLANEOUS) ×1 IMPLANT
KIT TURNOVER KIT B (KITS) ×1 IMPLANT
MODULE EMG NDL SSEP NVM5 (NEUROSURGERY SUPPLIES) IMPLANT
MODULE EMG NEEDLE SSEP NVM5 (NEUROSURGERY SUPPLIES) ×1 IMPLANT
NDL 22X1.5 STRL (OR ONLY) (MISCELLANEOUS) ×1 IMPLANT
NEEDLE 22X1.5 STRL (OR ONLY) (MISCELLANEOUS) ×1 IMPLANT
NS IRRIG 1000ML POUR BTL (IV SOLUTION) ×1 IMPLANT
PACK LAMINECTOMY ORTHO (CUSTOM PROCEDURE TRAY) ×1 IMPLANT
PATTIES SURGICAL .5 X.5 (GAUZE/BANDAGES/DRESSINGS) IMPLANT
PROBE BALL TIP NVM5 SNG USE (NEUROSURGERY SUPPLIES) IMPLANT
ROD RELINE-O 5.5X75 LORD (Rod) IMPLANT
SCREW LOCK RELINE 5.5 TULIP (Screw) IMPLANT
SCREW RELINE-O POLY 7.5X50 (Screw) ×6 IMPLANT
SCREW RLINE PLY 2S 50X7.5XPA (Screw) IMPLANT
SPONGE SURGIFOAM ABS GEL 100 (HEMOSTASIS) ×1 IMPLANT
SPONGE T-LAP 4X18 ~~LOC~~+RFID (SPONGE) ×1 IMPLANT
STRIP CLOSURE SKIN 1/2X4 (GAUZE/BANDAGES/DRESSINGS) IMPLANT
SUCTION TUBE FRAZIER 10FR DISP (SUCTIONS) ×1 IMPLANT
SUT BONE WAX W31G (SUTURE) ×1 IMPLANT
SUT ETHILON 2 0 FS 18 (SUTURE) IMPLANT
SUT MNCRL AB 3-0 PS2 18 (SUTURE) IMPLANT
SUT VIC AB 0 CT1 18XCR BRD8 (SUTURE) ×1 IMPLANT
SUT VIC AB 0 CT1 8-18 (SUTURE) ×2
SUT VIC AB 2-0 CT1 18 (SUTURE) ×1 IMPLANT
SUT VIC AB 2-0 CT2 18 VCP726D (SUTURE) IMPLANT
SUT VIC AB 3-0 PS2 18 (SUTURE)
SUT VIC AB 3-0 PS2 18XBRD (SUTURE) ×1 IMPLANT
SYR BULB IRRIG 60ML STRL (SYRINGE) ×1 IMPLANT
SYR CONTROL 10ML LL (SYRINGE) ×1 IMPLANT
TOWEL GREEN STERILE (TOWEL DISPOSABLE) ×1 IMPLANT
TOWEL GREEN STERILE FF (TOWEL DISPOSABLE) ×1 IMPLANT
TUBING FEATHERFLOW (TUBING) ×1 IMPLANT
WATER STERILE IRR 1000ML POUR (IV SOLUTION) ×1 IMPLANT

## 2022-12-08 NOTE — Progress Notes (Signed)
Orthopedic Surgery Post-operative Progress Note  Assessment: Patient is a 77 y.o. female who is currently admitted after undergoing L3-5 laminectomy and PSIF   Plan: -Operative plans complete -Needs upright films when able -Drains to be maintained until output slows -Out of bed as tolerated, no brace -No bending/lifting/twisting greater than 10 pounds -PT evaluate and treat -Pain control -Diabetic diet -No chemoprophylaxis for dvt or antiplatelets for 72 hours after surgery -Ancef x2 post-operative doses -Disposition: remain floor status  HTN -continue home amlodipine and ACEi  DM -sliding scale insulin -diabetic diet  HLD -continue home crestor  ___________________________________________________________________________   Subjective: No acute events since surgery. Has moved up to the floor. Having a lot of back pain. Feels cold, asking for more blankets.   Objective:  General: no acute distress, appropriate affect Neurologic: alert, answering questions appropriately, following commands Respiratory: unlabored breathing on room air Skin: dressing clear/dry/intact, drains with sanguinous output  MSK (spine):  -Strength exam      Right  Left  EHL    5/5  5/5 TA    5/5  5/5 GSC    5/5  5/5 Knee extension  5/5  5/5 Hip flexion   5/5  5/5  -Sensory exam    Sensation intact to light touch in L3-S1 nerve distributions of bilateral lower extremities   Patient name: Angela Pugh Patient MRN: [id number] Date: 12/08/22

## 2022-12-08 NOTE — Anesthesia Preprocedure Evaluation (Signed)
Anesthesia Evaluation  Patient identified by MRN, date of birth, ID band Patient awake    Reviewed: Allergy & Precautions, NPO status , Patient's Chart, lab work & pertinent test results  Airway Mallampati: II       Dental no notable dental hx. (+) Edentulous Upper, Edentulous Lower   Pulmonary COPD, Current Smoker and Patient abstained from smoking.   Pulmonary exam normal        Cardiovascular hypertension, Pt. on medications + dysrhythmias  Rhythm:Regular Rate:Normal     Neuro/Psych  Headaches  negative psych ROS   GI/Hepatic Neg liver ROS,GERD  Medicated,,  Endo/Other  diabetes, Type 2    Renal/GU   negative genitourinary   Musculoskeletal  (+) Arthritis , Osteoarthritis,    Abdominal Normal abdominal exam  (+)   Peds  Hematology Lab Results      Component                Value               Date                      WBC                      5.4                 12/01/2022                HGB                      15.4 (H)            12/01/2022                HCT                      45.8                12/01/2022                MCV                      96.0                12/01/2022                PLT                      245                 12/01/2022             Lab Results      Component                Value               Date                      NA                       137                 12/01/2022                K  3.7                 12/01/2022                CO2                      26                  12/01/2022                GLUCOSE                  140 (H)             12/01/2022                BUN                      10                  12/01/2022                CREATININE               0.85                12/01/2022                CALCIUM                  9.3                 12/01/2022                GFR                      47.48 (L)           07/06/2022                 GFRNONAA                 >60                 12/01/2022              Anesthesia Other Findings   Reproductive/Obstetrics                             Anesthesia Physical Anesthesia Plan  ASA: 3  Anesthesia Plan: General   Post-op Pain Management:    Induction: Intravenous  PONV Risk Score and Plan: 2 and Ondansetron, Dexamethasone and Treatment may vary due to age or medical condition  Airway Management Planned: Mask and Oral ETT  Additional Equipment: None  Intra-op Plan:   Post-operative Plan: Extubation in OR  Informed Consent: I have reviewed the patients History and Physical, chart, labs and discussed the procedure including the risks, benefits and alternatives for the proposed anesthesia with the patient or authorized representative who has indicated his/her understanding and acceptance.     Dental advisory given  Plan Discussed with: CRNA  Anesthesia Plan Comments:        Anesthesia Quick Evaluation

## 2022-12-08 NOTE — Anesthesia Procedure Notes (Signed)
Procedure Name: Intubation Date/Time: 12/08/2022 7:45 AM  Performed by: Loleta Rayven Hendrickson, CRNAPre-anesthesia Checklist: Patient identified, Patient being monitored, Timeout performed, Emergency Drugs available and Suction available Patient Re-evaluated:Patient Re-evaluated prior to induction Oxygen Delivery Method: Circle system utilized Preoxygenation: Pre-oxygenation with 100% oxygen Induction Type: IV induction Ventilation: Mask ventilation without difficulty Laryngoscope Size: Mac and 4 Grade View: Grade I Tube type: Oral Tube size: 7.0 mm Number of attempts: 1 Airway Equipment and Method: Stylet Placement Confirmation: ETT inserted through vocal cords under direct vision, positive ETCO2 and breath sounds checked- equal and bilateral Secured at: 22 cm Tube secured with: Tape Dental Injury: Teeth and Oropharynx as per pre-operative assessment

## 2022-12-08 NOTE — Brief Op Note (Signed)
12/08/2022  2:49 PM  PATIENT:  Angela Pugh  77 y.o. female  PRE-OPERATIVE DIAGNOSIS:  LUMBAR RADICULOPATHY  POST-OPERATIVE DIAGNOSIS:  LUMBAR RADICULOPATHY  PROCEDURE:  Procedure(s): L3-4 L4-5 POSTERIOR INSTRUMENTED LUMBAR FUSION / LAMINECTOMY (N/A)  SURGEON:  Surgeons and Role:    London Sheer, MD - Primary  PHYSICIAN ASSISTANT: None  ASSISTANTS: none   ANESTHESIA:   general  EBL:  300 mL   BLOOD ADMINISTERED:none  DRAINS:  2 medium hemovac drains    LOCAL MEDICATIONS USED:  NONE  SPECIMEN:  No Specimen  DISPOSITION OF SPECIMEN:  N/A  COUNTS:  YES  TOURNIQUET:  NONE  DICTATION: .Note written in EPIC  PLAN OF CARE: Admit to inpatient   PATIENT DISPOSITION:  PACU - hemodynamically stable.   Delay start of Pharmacological VTE agent (>24hrs) due to surgical blood loss or risk of bleeding: yes

## 2022-12-08 NOTE — Transfer of Care (Signed)
Immediate Anesthesia Transfer of Care Note  Patient: Angela Pugh  Procedure(s) Performed: L3-4 L4-5 POSTERIOR INSTRUMENTED LUMBAR FUSION / LAMINECTOMY (Spine Lumbar)  Patient Location: PACU  Anesthesia Type:General  Level of Consciousness: sedated  Airway & Oxygen Therapy: Patient Spontanous Breathing and Patient connected to face mask oxygen  Post-op Assessment: Report given to RN and Post -op Vital signs reviewed and stable  Post vital signs: Reviewed and stable  Last Vitals:  Vitals Value Taken Time  BP 138/83 12/08/22 1419  Temp    Pulse 62 12/08/22 1423  Resp 18 12/08/22 1423  SpO2 100 % 12/08/22 1423  Vitals shown include unfiled device data.  Last Pain:  Vitals:   12/08/22 0608  TempSrc:   PainSc: 9       Patients Stated Pain Goal: 0 (12/08/22 1610)  Complications: No notable events documented.

## 2022-12-08 NOTE — Plan of Care (Signed)

## 2022-12-08 NOTE — Anesthesia Postprocedure Evaluation (Signed)
Anesthesia Post Note  Patient: Angela Pugh  Procedure(s) Performed: L3-4 L4-5 POSTERIOR INSTRUMENTED LUMBAR FUSION / LAMINECTOMY (Spine Lumbar)     Patient location during evaluation: PACU Anesthesia Type: General Level of consciousness: awake and alert Pain management: pain level controlled Vital Signs Assessment: post-procedure vital signs reviewed and stable Respiratory status: spontaneous breathing, nonlabored ventilation, respiratory function stable and patient connected to nasal cannula oxygen Cardiovascular status: blood pressure returned to baseline and stable Postop Assessment: no apparent nausea or vomiting Anesthetic complications: no   No notable events documented.  Last Vitals:  Vitals:   12/08/22 1630 12/08/22 1654  BP: (!) 148/65 (!) 151/69  Pulse: (!) 57 61  Resp: 14 16  Temp:  36.4 C  SpO2: 92% 100%    Last Pain:  Vitals:   12/08/22 1654  TempSrc: Oral  PainSc:                  Angela Pugh

## 2022-12-08 NOTE — Op Note (Addendum)
Orthopedic Spine Surgery Operative Report  Procedure: L3, L4, L5 lumbar laminectomy L3-5 posterior lumbar spinal arthrodesis L3, L4, L5 pedicle screw instrumentation with rods Application of locally harvested autograft (spinous processes, removed lamina) Application of allograft morselized bone and demineralized bone matrix  Modifier: none  Date of procedure: 12/08/2022  Patient name: Angela Pugh MRN: [id number] DOB: July 01, 1945  Surgeon: Willia Craze, MD Assistant: none Pre-operative diagnosis: lumbar stenosis, lumbar spondylolisthesis, lumbar radiculopathy Post-operative diagnosis: same as above Findings: L3/4 and L4/5 hypertrophic facets and thickened ligamentum flavum, L4/5 spondylolisthesis  Specimens: none Anesthesia: general EBL: 300cc Complications: none Pre-incision antibiotic: ancef TXA given before incision as well  Implants:  Implant Name Type Inv. Item Serial No. Manufacturer Lot No. LRB No. Used Action  SCREW RELINE-O POLY 7.5X50 - [id number] Screw SCREW RELINE-O POLY 7.5X50  [employer]  N/A 6 Implanted  SCREW LOCK RELINE 5.5 [place] - [id number] Screw SCREW LOCK RELINE 5.5 [place]  [employer]  N/A 6 Implanted  ROD RELINE-O 5.5X75 LORD - [id number] Rod ROD RELINE-O 5.5X75 LORD  [employer]  N/A 2 Implanted  BONE FIBERS PLIAFX 10 - [id number] Bone Implant BONE FIBERS PLIAFX 10 [id number] [facility] [employer]  N/A 1 Implanted     Indication for procedure: Patient is a 77 y.o. female who presented to the office with symptoms consistent with lumbar radiculopathy. The patient had tried conservative treatments that did not provide any lasting relief. As result, operative management was discussed. The pre-operative MRI showed stenosis at L3, L4, L5 and there was concomitant spondylolisthesis so a L3, L4, L5 laminectomy with foraminotomies and arthrodesis from L3-5 was presented as a treatment option. The risks including but not limited to dural tear, nerve root  injury, paralysis, persistent pain, pseudarthrosis, infection, bleeding, hardware failure, adjacent segment disease, heart attack, death, stroke, fracture, blindness, and need for additional procedures were discussed with the patient. The benefit of the surgery would be relief of the patient's radiating leg pain. The alternatives to surgical management were covered with the patient and included continued monitoring, physical therapy, over-the-counter pain medications, ambulatory aids, repeat injections, and activity modification. All the patient's questions were answered to her satisfaction. After this discussion, the patient expressed understanding and elected to proceed with surgical intervention.    Procedure Description: The patient was met in the pre-operative holding area. The patient's identity and consent were verified. The operative site was marked. The patient's remaining questions about the surgery were answered. The patient was brought back to the operating room. General anesthesia was induced and an endotracheal tube was placed by the anesthesia staff. The neuromonitring technologist attached leads to the patient. The patient was transferred to the prone Hamilton table in the prone position. All bony prominences were well padded. The head of the bed was slightly elevated and the eyes were free from compression by the face pillow. The surgical area was cleansed with alcohol. Fluoroscopy was then brought in to check rotation on the AP image and to mark the levels on the lateral image. Baseline neuromonitoring signals were obtained. The patient's skin was then prepped and draped in a standard, sterile fashion. A time out was performed that identified the patient, the procedure, and the operative levels. All team members agreed with what was stated in the time out.   A midline incision over the spinous processes of the previously marked levels was made and sharp dissection was continued down through the  skin and dermis. Electrocautery was then used to continue the midline dissection down  to the level of the spinous process. Subperiosteal dissection was performed using electrocautery to expose the lamina out lateral to the facet joint capsule. Care was taken to not violate the facet joint capsules. A lateral fluoroscopic image was taken to confirm the level. Subperiosteal dissection with electrocautery was then done to expose the lamina and pars of L3 and L5. Electrocautery was used to expose the pars and top of the lamina of L5. The facet capsules of L3/4 and L/5 were removed with electrocautery. Electrocautery was then used to dissect lateral the facets to find the transverse process at each level. Electrocautery was used to clear the soft tissue off the transverse processes.   Next, the pedicle screws were placed. The starting points for the pedicle screws were identified using anatomical landmarks. Fluoroscopy was brought in in the AP view to confirm the start points. Each pedicle screw was placed under AP fluoroscopy. A true AP was obtained of the vertebra where screw was being placed. Then, a pilot hole was made with a 3mm matchstick burr at the start point. A pedicle finder was advanced into the pilot hole. AP fluoroscopy was used to confirm proper starting point. A image was taken when the pedicle finder had been advanced 10mm, 15mm, and at 20mm. The pedicle finder remained within the pedicle and did not breach medially at any of those markers. The pedicle finder was then advanced to 35mm and then withdrawn. A feeler was placed into the hole and confirmed no pedicle wall breaches. It was advanced to the ventral cortex by palpation. The screw length was then estimated off of the depth of the feeler. The widths of the screws had been predetermined as they were measured pre-operatively on the patient's advanced imaging. That measured length and pre-determined width screw was then inserted under AP  fluoroscopic guidance. The screw was advanced approximately 10mm, 15mm, and then 20mm. At each one of those intervals an image was taken to confirm that the screw was following the trajectory of the pedicle and had not breached medially. The screw was then advanced fully until there was good purchase. This process was repeated for every pedicle screw.     The screws that were inserted were [employer] Reline:               Left                  Right L3 7.5x40 (40) 7.5x50  (40) L4 7.5x50 (39) 7.5x50  (24) L5 7.5x50 (40) 7.5x50  (24)   AP and lateral fluoroscopic images were then taken to visualize all the screws. The screws were in satisfactory position. The screws were then stimulated and no screw stimulated under 24.  A rongeur was used to remove the spinous processes and interspinous ligaments between the L2/3 interspinous ligament to the cranial portion of the L5 spinous process. The spinous processes, but not the interspinous ligaments or soft tissue, was saved and morselized on the back table. Bone wax was used to obtain hemostasis at the bleeding bony surfaces. A high-speed burr was used to thin the lamina at L3, L4, and the cranial aspect of L5 to the level of the ligamentum flavum. Above the level of the ligamentum, the lamina was thinned with the burr to the approximate level of the ligamentum. A series of Kerrison rongeurs were used to remove the remaining lamina and ligamentum overlying the thecal sac. A woodsen was then placed into the wound to protect the thecal sac as  a kerrison was used to remove the medal aspect of the L3/4 and L4/5 facets. The same process was repeated for the contralateral side. A woodsen was then placed into the L3/4 foramen and a 2 kerrison was placed over it to remove the soft tissue and bone overlying the nerve root. The same process was repeated at the contralateral L3/4 foramen and then the bilateral L4/5 foramen. At this point, a woodsen was easily passed into all 4  of the decompressed foramen.   A woodsen was placed into the laminectomy site to palpate for any remaining areas of stenosis. The woodsen was easily passed and no further areas of stenosis were felt in the decompressed levels. A 5.5x61mm titanium rod was placed into the pedicle screws and set screws were tightened over the rod into each pedicle screw. The same process was repeated for the contralateral side. All the set screws were final tightened.  An AP fluoroscopic film was taken showing the laminectomy defect and appropriate position of the instrumentation. A lateral image confirmed appropriate position of the instrumentation. The wound was copiously irrigated with sterile saline.   A high speed burr was used to decorticate the L3/4 and L4/5 facet joints. The L3 pars, L3 remaining lamina, L4 pars, remaining L4 lamina, and the pars of L5 was decorticated with a burr. The transverse processes from L3 to L5 were decorticated with a burr. The locally harvested and morselized autograft and demineralized bone matrix were placed over the decorticated bone. All bony fragments that inadvertently went into the laminectomy defect were removed. The decompressed area was again palpated with a woodsen which confirmed satisfactory decompression.   1g of vancomycin powder was placed into the wound. A medium hemovac drain was placed deep to the fascia. The fascia was reapproximated with 0 vicryl suture. Final neuromonitoring signals were checked and there was no change from baseline. Neuromonitoring was discontinued at this time. A subcutaneous hemovac drain was placed above the fascia. The subcutaneous fat was reapproximated with 0 vicryl suture. The deep dermal layer was reapproximated with 2-0 viryl. The skin as closed with a 3-0 running moncryl. All counts were correct at the end of the case. The incision was dressed with steri strips and benzoine. An island dressing was placed over the wound. The patient was  transferred back to a bed and brought to the post-anesthesia care unit by anesthesia staff in stable condition.  Post-operative plan: The patient will recover in the post-anesthesia care unit and then go to the floor. The patient will receive two post-operative doses of ancef. The patient will be out of bed as tolerated with no brace. The patient will work with physical therapy. The drains will likely be removed prior to discharge. The patient's disposition will be determined based off how she does on the floor post-operatively.  Willia Craze, MD Orthopedic Surgeon

## 2022-12-08 NOTE — Discharge Instructions (Addendum)
Orthopedic Surgery Discharge Instructions  Patient name: Angela Pugh Procedure Performed: L3-5 laminectomy and posterior instrumented spinal fusion Date of Surgery: 12/08/2022 Surgeon: Willia Craze, MD  Pre-operative Diagnosis: lumbar spondylolisthesis, lumbar radiculopathy Post-operative Diagnosis: same as above  Discharge Date: 12/13/2022 Discharged to: home Discharge Condition: stable  Activity: You should refrain from bending, lifting, or twisting with objects greater than ten pounds until three months after surgery. You are encouraged to walk as much as desired. You can perform household activities such as cleaning dishes, doing laundry, vacuuming, etc. as long as the ten-pound restriction is followed. You do not need to wear a brace during the post-operative period.   Incision Care: Your incision site has a dressing over it. That dressing should remain in place and dry at all times for a total of one week after surgery. After one week, you can remove the dressing. Underneath the dressing, you will find pieces of tape. You should leave these pieces of tape in place. They will fall off with time. Do not pick, rub, or scrub at them. Do not put cream or lotion over the surgical area. After one week and once the dressing is off, it is okay to let soap and water run over your incision. Again, do not pick, scrub, or rub at the pieces of tape when bathing. Do not submerge (e.g., take a bath, swim, go in a hot tub, etc.) until six weeks after surgery. There may be some bloody drainage from the incision into the dressing after surgery. This is normal. You do not need to replace the dressing. Continue to leave it in place for the one week as instructed above. Should the dressing become saturated with blood or drainage, please call the office for further instructions.   Medications: You have been prescribed morphine. This is a narcotic pain medication and should only be taken as prescribed. You  should not drink alcohol or operate heavy machinery (including driving) while taking this medication. The morphine can cause constipation as a side effect. For that reason, you have been prescribed senna and miralax. These are both laxatives. You do not need to take this medication if you develop diarrhea. Should you remain constipated even while taking these medications, please increase the dose of miralax to twice daily. Tylenol has been prescribed to be taken every 8 hours, which will give you additional pain relief. You have robaxin at home which is a muscle relaxer and can be helpful for muscles spasms and back pain after this surgery. Please use this medication as needed for pain.   Do not take NSAIDs (ibuprofen, Aleve, Celebrex, naproxen, meloxicam, etc.) for the first 6 weeks after surgery as there is some evidence that their use may decrease the chances of successful fusion.   In order to set expectations for opioid prescriptions, you will only be prescribed opioids for a total of six weeks after surgery and, at two-weeks after surgery, your opioid prescription will start to tapered (decreased dosage and number of pills). If you have ongoing need for opioid medication six weeks after surgery, you will be referred to pain management. If you are already established with a provider that is giving you opioid medications, you should schedule an appointment with them for six weeks after surgery if you feel you are going to need another prescription. State law only allows for opioid prescriptions one week at a time. If you are running out of opioid medication near the end of the week, please call the office  during business hours before running out so I can send you another prescription.   You may resume any home blood thinners (warfarin, lovenox, apixaban, plavix, xarelto, etc) 72 hours after your surgery. Take these medications as they were previously prescribed.  Driving: You should not drive while  taking narcotic pain medications. You should start getting back to driving slowly and you may want to try driving in a parking lot before doing anything more.   Diet: You are safe to resume your regular diet after surgery.   Reasons to Call the Office After Surgery: You should feel free to call the office with any concerns or questions you have in the post-operative period, but you should definitely notify the office if you develop: -shortness of breath, chest pain, or trouble breathing -excessive bleeding, drainage, redness, or swelling around the surgical site -fevers, chills, or pain that is getting worse with each passing day -persistent nausea or vomiting -new weakness in either leg -new or worsening numbness or tingling in either leg -numbness in the groin, bowel or bladder incontinence -other concerns about your surgery  Follow Up Appointments: You should have an office appointment scheduled for approximately two weeks after surgery. If you do not remember when this appointment is or do not already have it scheduled, please call the office to schedule.   Office Information:  -Willia Craze, MD -Phone number: [phone] -Address: [address]       [place], [place] [address]

## 2022-12-08 NOTE — Progress Notes (Signed)
Verbal order from Dr. Ophelia Charter 2-4mg  of Morphine Q2 for severe pain. Administer PO meds before IV.

## 2022-12-08 NOTE — H&P (Signed)
Orthopedic Spine Surgery H&P Note  Assessment: Patient is a 77 y.o. female with low back pain that radiates into bilateral buttock and right lateral thigh consistent with radiculopathy   Plan: -Out of bed as tolerated, activity as tolerated, no brace -Covered the risks of surgery one more time with the patient and patient elected to proceed with planned surgery -Written consent verified -Hold anticoagulation in anticipation of surgery -ancef and TXA  on call to OR -NPO for procedure -Site marked -To OR when ready   The patient has symptoms consistent with lumbar radiculopathy. The patient's symptoms were not improving with conservative treatment so operative management was discussed in the form of L3, L4, L5 segment laminectomies with bilateral foraminotomies and L3-5 posterior instrumented spinal fusion at our last office visit. I went over the risks of surgery again this morning. Those risks included but were not limited to dural tear, nerve root injury, paralysis, persistent pain, pseudarthrosis, infection, bleeding, hardware failure, adjacent segment disease, heart attack, death, stroke, fracture, blindness, and need for additional procedures. The benefit of the surgery would be improvement in the patient's radiating leg and buttock pain. I explained that back pain relief is not the goal of the surgery and it is not reliably alleviated with this surgery. The alternatives to surgical management were covered with the patient and included continued monitoring, physical therapy, over-the-counter pain medications, ambulatory aids, and activity modification. All the patient's questions were answered to her satisfaction. After this discussion, the patient expressed understanding and wanted to continue with surgical intervention.   ___________________________________________________________________________  Chief Complaint: low back pain that radiates into bilateral buttock and lateral  thigh  History: Patient is 77 y.o. female who has been previously seen in the office for low back pain radiating into bilateral buttock and into the right lateral thigh. Work up was consistent with lumbar radiculopathy. These symptoms failed to improve with conservative treatment so operative management was discussed at the last office visit. The patient presents today with no changes in their symptoms since the last office visit. See previous office note for further details.    Review of systems: General: denies fevers and chills, myalgias Neurologic: denies recent changes in vision, slurred speech Abdomen: denies nausea, vomiting, hematemesis Respiratory: denies cough, shortness of breath  Past medical history: Chronic low back pain Diverticulosis HTN Migraines HLD DM (diet controlled, last A1c was 6.4 on 12/01/2022) Vertigo   Allergies: symbicort, tylenol, azithromycin, lipitor, zyrtec   Past surgical history:  C3-7 ACDF Hysterectomy Left L3/4 far lateral discectomy   Social history: Former use of nicotine product (smoking, vaping, patches, smokeless) Alcohol use: rare Denies recreational drug use  Family history: -reviewed and not pertinent to lumbar radiculopathy   Physical Exam:  General: no acute distress, appears stated age Neurologic: alert, answering questions appropriately, following commands Cardiovascular: regular rate, no cyanosis Respiratory: unlabored breathing on room air, symmetric chest rise Psychiatric: appropriate affect, normal cadence to speech   MSK (spine):  -Strength exam      Left  Right  EHL    5/5  5/5 TA    5/5  5/5 GSC    5/5  5/5 Knee extension  5/5  5/5 Knee flexion   5/5  5/5 Hip flexion   5/5  5/5  -Sensory exam    Sensation intact to light touch in L3-S1 nerve distributions of bilateral lower extremities   Patient name: Angela Pugh Patient MRN: [id number] Date: 12/08/22

## 2022-12-09 LAB — BASIC METABOLIC PANEL
Anion gap: 10 (ref 5–15)
BUN: <5 mg/dL — ABNORMAL LOW (ref 8–23)
CO2: 28 mmol/L (ref 22–32)
Calcium: 8.8 mg/dL — ABNORMAL LOW (ref 8.9–10.3)
Chloride: 104 mmol/L (ref 98–111)
Creatinine, Ser: 0.93 mg/dL (ref 0.44–1.00)
GFR, Estimated: >60 mL/min (ref >60)
Glucose, Bld: 120 mg/dL — ABNORMAL HIGH (ref 70–99)
Potassium: 3.3 mmol/L — ABNORMAL LOW (ref 3.5–5.1)
Sodium: 142 mmol/L (ref 135–145)

## 2022-12-09 LAB — CBC
HCT: 37.1 % (ref 36.0–46.0)
Hemoglobin: 12.4 g/dL (ref 12.0–15.0)
MCH: 31.6 pg (ref 26.0–34.0)
MCHC: 33.4 g/dL (ref 30.0–36.0)
MCV: 94.6 fL (ref 80.0–100.0)
Platelets: 197 10*3/uL (ref 150–400)
RBC: 3.92 MIL/uL (ref 3.87–5.11)
RDW: 12.1 % (ref 11.5–15.5)
WBC: 7.5 10*3/uL (ref 4.0–10.5)
nRBC: 0 % (ref 0.0–0.2)

## 2022-12-09 LAB — GLUCOSE, CAPILLARY
Glucose-Capillary: 139 mg/dL — ABNORMAL HIGH (ref 70–99)
Glucose-Capillary: 101 mg/dL — ABNORMAL HIGH (ref 70–99)
Glucose-Capillary: 126 mg/dL — ABNORMAL HIGH (ref 70–99)
Glucose-Capillary: 119 mg/dL — ABNORMAL HIGH (ref 70–99)

## 2022-12-09 MED ORDER — OXYCODONE HCL 5 MG PO TABS
10.0000 mg | ORAL_TABLET | ORAL | Status: DC | PRN
  Administered 2022-12-09: 10 mg via ORAL
  Filled 2022-12-09: qty 2

## 2022-12-09 MED ORDER — HYDROMORPHONE HCL 1 MG/ML IJ SOLN: 1.0000 mg | INTRAMUSCULAR | Status: DC | PRN

## 2022-12-09 MED ORDER — MORPHINE SULFATE (PF) 2 MG/ML IV SOLN
2.0000 mg | INTRAVENOUS | Status: DC | PRN
  Administered 2022-12-09 – 2022-12-12 (×6): 2 mg via INTRAVENOUS
  Filled 2022-12-09 (×7): qty 1

## 2022-12-09 MED ORDER — OXYCODONE HCL 5 MG PO TABS
5.0000 mg | ORAL_TABLET | ORAL | Status: DC | PRN
  Administered 2022-12-09 – 2022-12-11 (×4): 10 mg via ORAL
  Administered 2022-12-11: 5 mg via ORAL
  Administered 2022-12-11: 10 mg via ORAL
  Administered 2022-12-12: 5 mg via ORAL
  Filled 2022-12-09: qty 1
  Filled 2022-12-09 (×6): qty 2

## 2022-12-09 MED ORDER — MORPHINE SULFATE (PF) 2 MG/ML IV SOLN: 2.0000 mg | INTRAVENOUS | Status: DC | PRN

## 2022-12-09 MED ORDER — KETOROLAC TROMETHAMINE 30 MG/ML IJ SOLN
15.0000 mg | Freq: Three times a day (TID) | INTRAMUSCULAR | Status: AC
  Administered 2022-12-09 – 2022-12-10 (×5): 15 mg via INTRAVENOUS
  Filled 2022-12-09 (×6): qty 1

## 2022-12-09 NOTE — Progress Notes (Signed)
Orthopedic Surgery Post-operative Progress Note  Assessment: Patient is a 77 y.o. female who is currently admitted after undergoing L3-5 laminectomy and PSIF   Plan: -Operative plans complete -Needs upright films when able -Drains to be maintained until output slows -Out of bed as tolerated, no brace -No bending/lifting/twisting greater than 10 pounds -PT evaluate and treat -Pain control -Diabetic diet -No chemoprophylaxis for dvt or antiplatelets for 72 hours after surgery -Ancef x2 post-operative doses -Disposition: remain floor status  HTN -continue home amlodipine and ACEi  DM -sliding scale insulin -diabetic diet  HLD -continue home crestor  ___________________________________________________________________________   Subjective: No acute events overnight. Pain control was an issue last night. Did not get much sleep. Leg pain slightly better since surgery. Reports back pain is severe.   Objective:  General: no acute distress, appropriate affect Neurologic: alert, answering questions appropriately, following commands Respiratory: unlabored breathing on room air Skin: dressing clear/dry/intact, drains with sanguinous output  MSK (spine):  -Strength exam      Right  Left  EHL    5/5  5/5 TA    5/5  5/5 GSC    5/5  5/5 Knee extension  5/5  5/5 Hip flexion   5/5  5/5  -Sensory exam    Sensation intact to light touch in L3-S1 nerve distributions of bilateral lower extremities   Patient name: Angela Pugh Patient MRN: [id number] Date: 12/09/22

## 2022-12-09 NOTE — Plan of Care (Signed)
  Problem: Pain Managment: Goal: General experience of comfort will improve Outcome: Not Progressing   Problem: Clinical Measurements: Goal: Ability to maintain clinical measurements within normal limits will improve Outcome: Progressing Goal: Will remain free from infection Outcome: Progressing Goal: Diagnostic test results will improve Outcome: Progressing Goal: Respiratory complications will improve Outcome: Progressing Goal: Cardiovascular complication will be avoided Outcome: Progressing   Problem: Safety: Goal: Ability to remain free from injury will improve Outcome: Progressing

## 2022-12-09 NOTE — Evaluation (Signed)
Occupational Therapy Evaluation Patient Details Name: Angela Pugh MRN: [id number] DOB: 26-Oct-1945 Today's Date: 12/09/2022   History of Present Illness 77 y.o. female who has been previously seen in the office for low back pain radiating into bilateral buttock and into the right lateral thigh.  Work up was consistent with lumbar radiculopathy. She is now s/p L3, L4, L5 lumbar laminectomy 12/08/22.   Clinical Impression   Pt admitted for above dx, PTA pt lived alone and reports being independent in mobility and ADLs. Upon IE, pt is limited by pain, onset of nausea and profuse seating once seated EOB. Pt not ambulating due to listed reason, Mod A for functional pivot transfers. Educated pt on POB precautions and log rolling, likely needing reinforcement as pt was limited by suspected vagal response and not fully attending to education. Pt would benefit from continue acute skilled OT services to address deficits and help transition to next level of care. DC recs pending further assessment of functional status and pt progression.     Recommendations for follow up therapy are one component of a multi-disciplinary discharge planning process, led by the attending physician.  Recommendations may be updated based on patient status, additional functional criteria and insurance authorization.   Assistance Recommended at Discharge Frequent or constant Supervision/Assistance  Patient can return home with the following A lot of help with walking and/or transfers;A lot of help with bathing/dressing/bathroom;Help with stairs or ramp for entrance;Assist for transportation;Assistance with cooking/housework    Functional Status Assessment  Patient has had a recent decline in their functional status and demonstrates the ability to make significant improvements in function in a reasonable and predictable amount of time.  Equipment Recommendations  None recommended by OT (TBD pending progression)     Recommendations for Other Services PT consult     Precautions / Restrictions Precautions Precautions: Back Precaution Booklet Issued: Yes (comment) Precaution Comments: no brace needed Restrictions Weight Bearing Restrictions: No      Mobility Bed Mobility Overal bed mobility: Needs Assistance Bed Mobility: Rolling, Sidelying to Sit, Sit to Sidelying Rolling: Min assist Sidelying to sit: Min assist     Sit to sidelying: Mod assist General bed mobility comments: verbal/tactile cues for log rolling    Transfers Overall transfer level: Needs assistance Equipment used: 1 person hand held assist Transfers: Sit to/from Stand, Bed to chair/wheelchair/BSC Sit to Stand: Min assist Stand pivot transfers: Mod assist         General transfer comment: STS from EOB and BSC min A with pt using rails/arm rests to push off      Balance Overall balance assessment: Needs assistance Sitting-balance support: Feet supported, Single extremity supported Sitting balance-Leahy Scale: Fair       Standing balance-Leahy Scale: Poor                             ADL either performed or assessed with clinical judgement   ADL Overall ADL's : Needs assistance/impaired Eating/Feeding: Independent;Bed level   Grooming: Sitting;Min guard   Upper Body Bathing: Modified independent     Lower Body Bathing Details (indicate cue type and reason): NT, onset of nausea Upper Body Dressing : Modified independent     Lower Body Dressing Details (indicate cue type and reason): NT, onset of nausea Toilet Transfer: Stand-pivot;Moderate assistance   Toileting- Clothing Manipulation and Hygiene: Min guard;Sitting/lateral lean         General ADL Comments: Pt limited in today's  session by onset of nausea and profuse sweating. Pt reports feeling very hot, suspect vagal reaction from pain.     Vision         Perception     Praxis      Pertinent Vitals/Pain Pain Assessment Pain  Assessment: 0-10 Pain Score: 10-Worst pain ever Pain Location: back, post op site Pain Descriptors / Indicators: Aching, Discomfort, Sore, Grimacing Pain Intervention(s): Monitored during session, Repositioned, Limited activity within patient's tolerance     Hand Dominance Right   Extremity/Trunk Assessment Upper Extremity Assessment Upper Extremity Assessment: Generalized weakness (limited by vagal response)   Lower Extremity Assessment Lower Extremity Assessment: Generalized weakness (limited by vagal response)   Cervical / Trunk Assessment Cervical / Trunk Assessment: Back Surgery   Communication Communication Communication: No difficulties   Cognition Arousal/Alertness: Awake/alert Behavior During Therapy: WFL for tasks assessed/performed Overall Cognitive Status: Within Functional Limits for tasks assessed                                 General Comments: Pt completing simple tasks appropriately, became more limited with onset of vagal response     General Comments  Pt BP 135/59 taken supine in bed, BP 122/60 taken sitting EOB. Pt with c/o nausea and noted to be sweating profusely, suspect vagal response. Sp02 95-100% on RA but desat to 88% when going to sleep, returned pt to 2L to return Sp02 back to her normal range.            Home Living Family/patient expects to be discharged to:: Private residence Living Arrangements: Alone Available Help at Discharge: Family;Available PRN/intermittently (siblings live 5-10 minutes away) Type of Home: House Home Access: Stairs to enter [employer] of Steps: 1   Home Layout: One level     Bathroom Shower/Tub: Chief [employer] Officer: Standard Bathroom Accessibility: Yes How Accessible: Accessible via walker Home Equipment: Rolling Walker (2 wheels);BSC/3in1;Cane - single point;Hand held shower head;Grab bars - toilet   Additional Comments: Denies any falls in the last year       Prior Functioning/Environment Prior Level of Function : Independent/Modified Independent;Driving             Mobility Comments: no AD ADLs Comments: ind        OT Problem List: Pain      OT Treatment/Interventions: Self-care/ADL training;Therapeutic exercise;DME and/or AE instruction;Therapeutic activities;Patient/family education;Balance training    OT Goals(Current goals can be found in the care plan section) Acute Rehab OT Goals Patient Stated Goal: To go home OT Goal Formulation: With patient Time For Goal Achievement: 12/23/22 Potential to Achieve Goals: Good ADL Goals Pt Will Perform Grooming: standing;with supervision Pt Will Perform Lower Body Bathing: sitting/lateral leans;with supervision Pt Will Perform Lower Body Dressing: sitting/lateral leans;with supervision Pt Will Transfer to Toilet: ambulating;with supervision  OT Frequency: Min 1X/week    Co-evaluation              AM-PAC OT "6 Clicks" Daily Activity     Outcome Measure Help from another person eating meals?: None Help from another person taking care of personal grooming?: A Little Help from another person toileting, which includes using toliet, bedpan, or urinal?: A Lot Help from another person bathing (including washing, rinsing, drying)?: A Lot Help from another person to put on and taking off regular upper body clothing?: None Help from another person to put on and taking off regular lower body  clothing?: A Lot 6 Click Score: 17   End of Session Equipment Utilized During Treatment: Gait belt Nurse Communication: Mobility status  Activity Tolerance: Patient limited by pain Patient left: in bed;with call bell/phone within reach;with bed alarm set  OT Visit Diagnosis: Pain Pain - part of body:  (back)                Time: 4401-0272 OT Time Calculation (min): 36 min Charges:  OT General Charges $OT Visit: 1 Visit OT Evaluation $OT Eval Moderate Complexity: 1 Mod OT  Treatments $Therapeutic Activity: 8-22 mins  12/09/2022  AB, OTR/L  Acute Rehabilitation Services  Office: [phone]   Tristan Schroeder 12/09/2022, 12:19 PM

## 2022-12-10 LAB — GLUCOSE, CAPILLARY
Glucose-Capillary: 112 mg/dL — ABNORMAL HIGH (ref 70–99)
Glucose-Capillary: 108 mg/dL — ABNORMAL HIGH (ref 70–99)
Glucose-Capillary: 108 mg/dL — ABNORMAL HIGH (ref 70–99)
Glucose-Capillary: 118 mg/dL — ABNORMAL HIGH (ref 70–99)

## 2022-12-10 NOTE — Progress Notes (Signed)
Occupational Therapy Treatment Patient Details Name: Angela Pugh MRN: [id number] DOB: 01-13-1946 Today's Date: 12/10/2022   History of present illness 77 y.o. female who has been previously seen in the office for low back pain radiating into bilateral buttock and into the right lateral thigh.  Work up was consistent with lumbar radiculopathy. She is now s/p L3, L4, L5 lumbar laminectomy 12/08/22.   OT comments  Pt with decreased recall of POB precautions, needing verbal cues to name 3/3 precautions. Pt not tolerating EOB sitting well this pm, became nauseous and reports the room was spinning. Pt wanting to lay back down immediately so she was returned to supine. Further mobility deferred as pt not tolerating much. OT to continue to progress pt as able, DC plans update to SNF as patient is displaying decreased tolerance to activity at this time.   Recommendations for follow up therapy are one component of a multi-disciplinary discharge planning process, led by the attending physician.  Recommendations may be updated based on patient status, additional functional criteria and insurance authorization.    Assistance Recommended at Discharge Frequent or constant Supervision/Assistance  Patient can return home with the following  A lot of help with walking and/or transfers;A lot of help with bathing/dressing/bathroom;Help with stairs or ramp for entrance;Assist for transportation;Assistance with cooking/housework   Equipment Recommendations  None recommended by OT (Defer to next level of care)    Recommendations for Other Services      Precautions / Restrictions Precautions Precautions: Back Precaution Booklet Issued: Yes (comment) Precaution Comments: reviewed back precautions Restrictions Weight Bearing Restrictions: No       Mobility Bed Mobility Overal bed mobility: Needs Assistance Bed Mobility: Rolling, Sidelying to Sit, Sit to Sidelying Rolling: Min assist Sidelying to sit:  Min assist     Sit to sidelying: Min assist General bed mobility comments: Pt needing cues to sequence log roll and min A for UB control. Min A to assist BLEs back in bed    Transfers                   General transfer comment: deferred     Balance Overall balance assessment: Needs assistance Sitting-balance support: Feet supported, Single extremity supported Sitting balance-Leahy Scale: Fair       Standing balance-Leahy Scale: Poor Standing balance comment: Poor sitting tolerance due to dizziness                           ADL either performed or assessed with clinical judgement   ADL                                         General ADL Comments: Pt not tolerating EOB sitting today, became very dizzy and nausous. Pt reports "the room is spinning" so she was returned to supine which alleviated her symptoms. Per notes pt was OOB with nursing staff in the am, to which she tolerated fairly.    Extremity/Trunk Assessment              Vision       Perception     Praxis      Cognition Arousal/Alertness: Awake/alert Behavior During Therapy: WFL for tasks assessed/performed Overall Cognitive Status: Within Functional Limits for tasks assessed  Exercises      Shoulder Instructions       General Comments Pt very limted today.    Pertinent Vitals/ Pain       Pain Assessment Pain Assessment: 0-10 Pain Score: 8  Pain Descriptors / Indicators: Aching, Discomfort, Sore, Grimacing Pain Intervention(s): Monitored during session, Limited activity within patient's tolerance, Repositioned  Home Living                                          Prior Functioning/Environment              Frequency  Min 1X/week        Progress Toward Goals  OT Goals(current goals can now be found in the care plan section)  Progress towards OT goals: Not progressing  toward goals - comment (limited by pain response)  Acute Rehab OT Goals OT Goal Formulation: With patient Time For Goal Achievement: 12/23/22 Potential to Achieve Goals: Good ADL Goals Additional ADL Goal #2: Pt will tolerate 5 min EOB sitting with Supervision in preparation for bedside bADLs and OOB mobility  Plan Frequency remains appropriate;Discharge plan needs to be updated       AM-PAC OT "6 Clicks" Daily Activity     Outcome Measure   Help from another person eating meals?: None Help from another person taking care of personal grooming?: A Little Help from another person toileting, which includes using toliet, bedpan, or urinal?: A Lot Help from another person bathing (including washing, rinsing, drying)?: A Lot Help from another person to put on and taking off regular upper body clothing?: None Help from another person to put on and taking off regular lower body clothing?: A Lot 6 Click Score: 17    End of Session    OT Visit Diagnosis: Pain Pain - part of body:  (back)   Activity Tolerance Patient limited by pain   Patient Left in bed;with call bell/phone within reach;with bed alarm set   Nurse Communication Mobility status        Time: 7322-0254 OT Time Calculation (min): 16 min  Charges: OT General Charges $OT Visit: 1 Visit OT Treatments $Therapeutic Activity: 8-22 mins  12/10/2022  AB, OTR/L  Acute Rehabilitation Services  Office: [phone]   Tristan Schroeder 12/10/2022, 4:03 PM

## 2022-12-10 NOTE — Plan of Care (Signed)
  Problem: Clinical Measurements: Goal: Ability to maintain clinical measurements within normal limits will improve Outcome: Progressing Goal: Will remain free from infection Outcome: Progressing Goal: Diagnostic test results will improve Outcome: Progressing Goal: Respiratory complications will improve Outcome: Progressing   Problem: Activity: Goal: Risk for activity intolerance will decrease Outcome: Progressing   Problem: Nutrition: Goal: Adequate nutrition will be maintained Outcome: Progressing   Problem: Coping: Goal: Level of anxiety will decrease Outcome: Progressing   

## 2022-12-10 NOTE — Care Management Important Message (Signed)
Important Message  Patient Details  Name: Angela Pugh MRN: [id number] Date of Birth: 1946/03/26   Medicare Important Message Given:  Yes     Sherilyn Banker 12/10/2022, 1:48 PM

## 2022-12-10 NOTE — Progress Notes (Addendum)
Pt in a pleasant mood this morning, although she still experiencing pain, she states that pain is much better, assisted pt to [facility] to urinate and repositioning, pt tolerated fair. VS WNL, no acute distress noted.

## 2022-12-10 NOTE — Progress Notes (Addendum)
Orthopedic Surgery Post-operative Progress Note  Assessment: Patient is a 77 y.o. female who is currently admitted after undergoing L3-5 laminectomy and PSIF   Plan: -Operative plans complete -Needs upright films when able -Drains: will likely remove tomorrow -Out of bed as tolerated, no brace -No bending/lifting/twisting greater than 10 pounds -PT evaluate and treat -Pain control -Diabetic diet -No chemoprophylaxis for dvt or antiplatelets for 72 hours after surgery -Disposition: remain floor status  HTN -continue home amlodipine and ACEi  DM -sliding scale insulin -diabetic diet  HLD -continue home crestor  ___________________________________________________________________________   Subjective: No acute events overnight. Was able to get some sleep last night. Pain much better controlled today as opposed to yesterday. Her pain is localized to the low back around the incision. Denies paresthesias and numbness.   She has not been using her pain medication regularly and I explained to her that she should use those to help with the pain. They will also decrease her pain so she can mobilize better.   Objective:  General: no acute distress, appropriate affect Neurologic: alert, answering questions appropriately, following commands Respiratory: unlabored breathing on room air Skin: dressing clear/dry/intact, drains with sanguinous output  MSK (spine):  -Strength exam      Right  Left  EHL    5/5  5/5 TA    5/5  5/5 GSC    5/5  5/5 Knee extension  5/5  5/5 Hip flexion   5/5  5/5  -Sensory exam    Sensation intact to light touch in L3-S1 nerve distributions of bilateral lower extremities   Patient name: Angela Pugh Patient MRN: [id number] Date: 12/10/22

## 2022-12-10 NOTE — Plan of Care (Signed)

## 2022-12-11 ENCOUNTER — Inpatient Hospital Stay (HOSPITAL_COMMUNITY): Payer: Medicare Other

## 2022-12-11 DIAGNOSIS — M1612 Unilateral primary osteoarthritis, left hip: Secondary | ICD-10-CM | POA: Diagnosis not present

## 2022-12-11 DIAGNOSIS — M5126 Other intervertebral disc displacement, lumbar region: Secondary | ICD-10-CM | POA: Diagnosis not present

## 2022-12-11 DIAGNOSIS — M47816 Spondylosis without myelopathy or radiculopathy, lumbar region: Secondary | ICD-10-CM | POA: Diagnosis not present

## 2022-12-11 LAB — GLUCOSE, CAPILLARY
Glucose-Capillary: 97 mg/dL (ref 70–99)
Glucose-Capillary: 229 mg/dL — ABNORMAL HIGH (ref 70–99)
Glucose-Capillary: 181 mg/dL — ABNORMAL HIGH (ref 70–99)
Glucose-Capillary: 202 mg/dL — ABNORMAL HIGH (ref 70–99)

## 2022-12-11 MED ORDER — DEXAMETHASONE SODIUM PHOSPHATE 10 MG/ML IJ SOLN
8.0000 mg | Freq: Three times a day (TID) | INTRAMUSCULAR | Status: AC
  Administered 2022-12-11 (×3): 8 mg via INTRAVENOUS
  Filled 2022-12-11 (×3): qty 1

## 2022-12-11 MED ORDER — HEPARIN SODIUM (PORCINE) 5000 UNIT/ML IJ SOLN
5000.0000 [IU] | Freq: Three times a day (TID) | INTRAMUSCULAR | Status: DC
  Administered 2022-12-11 – 2022-12-13 (×7): 5000 [IU] via SUBCUTANEOUS
  Filled 2022-12-11 (×8): qty 1

## 2022-12-11 NOTE — Progress Notes (Signed)
Orthopedic Surgery Post-operative Progress Note  Assessment: Patient is a 77 y.o. female who is currently admitted after undergoing L3-5 laminectomy and PSIF   Plan: -Operative plans complete -Needs upright films when able -Drains: will remove tomorrow evening -Out of bed as tolerated, no brace -No bending/lifting/twisting greater than 10 pounds -PT evaluate and treat -Pain control -Diabetic diet -DVT ppx: heparin -Disposition: remain floor status  HTN -continue home amlodipine and ACEi  DM -sliding scale insulin -diabetic diet  HLD -continue home crestor  ___________________________________________________________________________   Subjective: No acute events overnight. Still having back and right leg pain. Has not been using her pain medication regularly. Reports having difficulty mobilizing due to pain.   Objective:  General: no acute distress, appropriate affect Neurologic: alert, answering questions appropriately, following commands Respiratory: unlabored breathing on room air Skin: dressing clear/dry/intact, drains with serosanguinous output  MSK (spine):  -Strength exam      Right  Left  EHL    5/5  5/5 TA    5/5  5/5 GSC    5/5  5/5 Knee extension  5/5  5/5 Hip flexion   5/5  5/5  -Sensory exam    Sensation intact to light touch in L3-S1 nerve distributions of bilateral lower extremities   Patient name: Angela Pugh Patient MRN: [id number] Date: 12/11/22

## 2022-12-11 NOTE — Plan of Care (Signed)

## 2022-12-12 LAB — GLUCOSE, CAPILLARY
Glucose-Capillary: 165 mg/dL — ABNORMAL HIGH (ref 70–99)
Glucose-Capillary: 169 mg/dL — ABNORMAL HIGH (ref 70–99)
Glucose-Capillary: 142 mg/dL — ABNORMAL HIGH (ref 70–99)
Glucose-Capillary: 177 mg/dL — ABNORMAL HIGH (ref 70–99)

## 2022-12-12 MED ORDER — HYDROMORPHONE HCL 2 MG PO TABS
2.0000 mg | ORAL_TABLET | ORAL | Status: DC | PRN
  Administered 2022-12-12: 2 mg via ORAL
  Filled 2022-12-12: qty 1

## 2022-12-12 MED ORDER — HYDROMORPHONE HCL 2 MG PO TABS: 4.0000 mg | ORAL_TABLET | ORAL | Status: DC | PRN

## 2022-12-12 MED ORDER — HYDROCODONE-ACETAMINOPHEN 5-325 MG PO TABS: 1.0000 | ORAL_TABLET | ORAL | Status: DC | PRN

## 2022-12-12 NOTE — Plan of Care (Signed)
°  Problem: Clinical Measurements: Goal: Will remain free from infection Outcome: Progressing   Problem: Nutrition: Goal: Adequate nutrition will be maintained Outcome: Progressing   Problem: Coping: Goal: Level of anxiety will decrease Outcome: Progressing

## 2022-12-12 NOTE — Progress Notes (Signed)
Orthopedic Surgery Post-operative Progress Note  Assessment: Patient is a 77 y.o. female who is currently admitted after undergoing L3-5 laminectomy and PSIF   Plan: -Operative plans complete -Upright films complete -Drains: will remove 7/28 am -Out of bed as tolerated, no brace -No bending/lifting/twisting greater than 10 pounds -PT evaluate and treat -Pain control -Diabetic diet -DVT ppx: heparin -Disposition: remain floor status, possible DC 7/28  HTN -continue home amlodipine and ACEi  DM -sliding scale insulin -diabetic diet  HLD -continue home crestor  ___________________________________________________________________________   Subjective: No acute events overnight. Pain in back leg improving. Voiding. Tolerated diet.  Objective:  General: no acute distress, appropriate affect Neurologic: alert, answering questions appropriately, following commands Respiratory: unlabored breathing on room air Skin: dressing clear/dry/intact, drains with serosanguinous output  MSK (spine):  -Strength exam      Right  Left  EHL    5/5  5/5 TA    5/5  5/5 GSC    5/5  5/5 Knee extension  5/5  5/5 Hip flexion   5/5  5/5  -Sensory exam    Sensation intact to light touch in L3-S1 nerve distributions of bilateral lower extremities   Patient name: Angela Pugh Patient MRN: [id number] Date: 12/12/22

## 2022-12-12 NOTE — Plan of Care (Signed)
  Problem: Education: Goal: Knowledge of General Education information will improve Description: Including pain rating scale, medication(s)/side effects and non-pharmacologic comfort measures Outcome: Progressing   Problem: Activity: Goal: Risk for activity intolerance will decrease Outcome: Progressing   Problem: Nutrition: Goal: Adequate nutrition will be maintained Outcome: Progressing   Problem: Pain Managment: Goal: General experience of comfort will improve Outcome: Progressing   Problem: Health Behavior/Discharge Planning: Goal: Ability to manage health-related needs will improve Outcome: Progressing   Problem: Metabolic: Goal: Ability to maintain appropriate glucose levels will improve Outcome: Progressing

## 2022-12-13 ENCOUNTER — Other Ambulatory Visit: Payer: Self-pay | Admitting: Family Medicine

## 2022-12-13 LAB — GLUCOSE, CAPILLARY
Glucose-Capillary: 105 mg/dL — ABNORMAL HIGH (ref 70–99)
Glucose-Capillary: 122 mg/dL — ABNORMAL HIGH (ref 70–99)

## 2022-12-13 MED ORDER — MORPHINE SULFATE 15 MG PO TABS: 7.5000 mg | ORAL_TABLET | ORAL | 0 refills | Status: AC | PRN

## 2022-12-13 MED ORDER — MORPHINE SULFATE 15 MG PO TABS: 7.5000 mg | ORAL_TABLET | ORAL | Status: DC | PRN

## 2022-12-13 MED ORDER — POLYETHYLENE GLYCOL 3350 17 G PO PACK: 17.0000 g | PACK | Freq: Every day | ORAL | 0 refills | Status: AC

## 2022-12-13 MED ORDER — SENNA 8.6 MG PO TABS: 1.0000 | ORAL_TABLET | Freq: Two times a day (BID) | ORAL | 0 refills | Status: AC

## 2022-12-13 NOTE — Progress Notes (Addendum)
Orthopedic Surgery Post-operative Progress Note  Assessment: Patient is a 77 y.o. female who is currently admitted after undergoing L3-5 laminectomy and PSIF   Plan: -Operative plans complete -Drains: removed this morning -Out of bed as tolerated, no brace -No bending/lifting/twisting greater than 10 pounds -PT evaluate and treat -Pain control -Diabetic diet -DVT ppx: heparin -Anticipate discharge to home today  HTN -continue home amlodipine and ACEi  DM -sliding scale insulin -diabetic diet  HLD -continue home crestor  ___________________________________________________________________________   Subjective: No acute events overnight. Pain is much better. Not having any radiating leg pain. Is now able to walk around the room without significant pain. Denies paresthesias and numbness.   Has not had any PRN narcotics since 9pm last night.   Objective:  General: no acute distress, appropriate affect Neurologic: alert, answering questions appropriately, following commands Respiratory: unlabored breathing on room air Skin: dressing clear/dry/intact, drains no output in the canister  Walked the halls without assistive devices. She was able to walk the whole unit without any assistance. She got out of bed on her own. She was able to go up and down the stairs in the PT gym without assistance.   MSK (spine):  -Strength exam      Right  Left  EHL    5/5  5/5 TA    5/5  5/5 GSC    5/5  5/5 Knee extension  5/5  5/5 Hip flexion   5/5  5/5  -Sensory exam    Sensation intact to light touch in L3-S1 nerve distributions of bilateral lower extremities   Patient name: Angela Pugh Patient MRN: [id number] Date: 12/13/22

## 2022-12-13 NOTE — Discharge Summary (Signed)
Orthopedic Surgery Discharge Summary  Patient name: Muntas Recktenwald Patient MRN: [id number] Admit today: 12/08/2022  Discharge date: 12/13/2022  Attending physician: Willia Craze, MD Final diagnosis: lumbar radiculopathy, lumbar spondylolisthesis  Findings:  L3/4 and L4/5 hypertrophic facets and thickened ligamentum flavum, L4/5 spondylolisthesis   Hospital course: Patient is a 77 y.o. female who was admitted after undergoing L3-5 laminectomy and posterior instrumented spinal fusion. The patient had significant pain immediately after surgery and had reactions to the pain medications so it was pain was not well controlled for the first 2 days. Then, pain decreased and was better controlled with oral medications. The patient worked with physical therapy who initially recommended discharge to SNF but when pain improved, patient was able to mobilize on her own without any assistive devices. She demonstrated an ability to safely go home. The patient was tolerating an oral diet without issue and was voiding spontaneously after surgery. The patient's vitals were stable on the day of discharge. The patient's drains were removed on the day of discharge. The patient was medically ready for discharge and was discharge to home on post-operative day 5.  Instructions:   Orthopedic Surgery Discharge Instructions  Patient name: Deshun Lorman Procedure Performed: L3-5 laminectomy and posterior instrumented spinal fusion Date of Surgery: 12/08/2022 Surgeon: Willia Craze, MD  Pre-operative Diagnosis: lumbar spondylolisthesis, lumbar radiculopathy Post-operative Diagnosis: same as above  Discharge Date: 12/13/2022 Discharged to: home Discharge Condition: stable  Activity: You should refrain from bending, lifting, or twisting with objects greater than ten pounds until three months after surgery. You are encouraged to walk as much as desired. You can perform household activities such as cleaning dishes,  doing laundry, vacuuming, etc. as long as the ten-pound restriction is followed. You do not need to wear a brace during the post-operative period.   Incision Care: Your incision site has a dressing over it. That dressing should remain in place and dry at all times for a total of one week after surgery. After one week, you can remove the dressing. Underneath the dressing, you will find pieces of tape. You should leave these pieces of tape in place. They will fall off with time. Do not pick, rub, or scrub at them. Do not put cream or lotion over the surgical area. After one week and once the dressing is off, it is okay to let soap and water run over your incision. Again, do not pick, scrub, or rub at the pieces of tape when bathing. Do not submerge (e.g., take a bath, swim, go in a hot tub, etc.) until six weeks after surgery. There may be some bloody drainage from the incision into the dressing after surgery. This is normal. You do not need to replace the dressing. Continue to leave it in place for the one week as instructed above. Should the dressing become saturated with blood or drainage, please call the office for further instructions.   Medications: You have been prescribed morphine. This is a narcotic pain medication and should only be taken as prescribed. You should not drink alcohol or operate heavy machinery (including driving) while taking this medication. The morphine can cause constipation as a side effect. For that reason, you have been prescribed senna and miralax. These are both laxatives. You do not need to take this medication if you develop diarrhea. Should you remain constipated even while taking these medications, please increase the dose of miralax to twice daily. Tylenol has been prescribed to be taken every 8 hours, which will  give you additional pain relief. You have robaxin at home which is a muscle relaxer and can be helpful for muscles spasms and back pain after this surgery. Please use  this medication as needed for pain.   Do not take NSAIDs (ibuprofen, Aleve, Celebrex, naproxen, meloxicam, etc.) for the first 6 weeks after surgery as there is some evidence that their use may decrease the chances of successful fusion.   In order to set expectations for opioid prescriptions, you will only be prescribed opioids for a total of six weeks after surgery and, at two-weeks after surgery, your opioid prescription will start to tapered (decreased dosage and number of pills). If you have ongoing need for opioid medication six weeks after surgery, you will be referred to pain management. If you are already established with a provider that is giving you opioid medications, you should schedule an appointment with them for six weeks after surgery if you feel you are going to need another prescription. State law only allows for opioid prescriptions one week at a time. If you are running out of opioid medication near the end of the week, please call the office during business hours before running out so I can send you another prescription.   You may resume any home blood thinners (warfarin, lovenox, apixaban, plavix, xarelto, etc) 72 hours after your surgery. Take these medications as they were previously prescribed.  Driving: You should not drive while taking narcotic pain medications. You should start getting back to driving slowly and you may want to try driving in a parking lot before doing anything more.   Diet: You are safe to resume your regular diet after surgery.   Reasons to Call the Office After Surgery: You should feel free to call the office with any concerns or questions you have in the post-operative period, but you should definitely notify the office if you develop: -shortness of breath, chest pain, or trouble breathing -excessive bleeding, drainage, redness, or swelling around the surgical site -fevers, chills, or pain that is getting worse with each passing day -persistent nausea  or vomiting -new weakness in either leg -new or worsening numbness or tingling in either leg -numbness in the groin, bowel or bladder incontinence -other concerns about your surgery  Follow Up Appointments: You should have an office appointment scheduled for approximately two weeks after surgery. If you do not remember when this appointment is or do not already have it scheduled, please call the office to schedule.   Office Information:  -Willia Craze, MD -Phone number: [phone] -Address: [address]       [place], [place] [address]

## 2022-12-13 NOTE — Plan of Care (Signed)

## 2022-12-13 NOTE — Plan of Care (Signed)

## 2022-12-14 ENCOUNTER — Telehealth: Payer: Self-pay

## 2022-12-14 NOTE — Transitions of Care (Post Inpatient/ED Visit) (Signed)
12/14/2022  Name: Angela Pugh MRN: [id number] DOB: November 14, 1945  Today's TOC FU Call Status: Today's TOC FU Call Status:: Successful TOC FU Call Competed TOC FU Call Complete Date: 12/14/22  Transition Care Management Follow-up Telephone Call Date of Discharge: 12/13/22 Discharge Facility: [facility]) Type of Discharge: Inpatient Admission Primary Inpatient Discharge Diagnosis:: "radiculopathy,lumbar region" How have you been since you were released from the hospital?: Better (Pt pleased to report she is doing "a whole lot bettter."States she does not have much pain at all-has not taken any pain meds today." She has been up moving & did some light housework(washing dishes). Appetite good. LBM yest. Blood sugar this AM-115.) Any questions or concerns?: No  Items Reviewed: Did you receive and understand the discharge instructions provided?: Yes Medications obtained,verified, and reconciled?: Yes (Medications Reviewed) Any new allergies since your discharge?: No Dietary orders reviewed?: Yes Type of Diet Ordered:: low salt/heart healthy/carb modified Do you have support at home?: Yes People in Home: child(ren), adult Name of Support/Comfort Primary Source: daughter helping her out  Medications Reviewed Today: Medications Reviewed Today     Reviewed by Charlyn Minerva, RN (Registered Nurse) on 12/14/22 at 1620  Med List Status: <None>   Medication Order Taking? Sig Documenting Provider Last Dose Status Informant  amLODipine (NORVASC) 5 MG tablet [id number] Yes TAKE 1 TABLET(5 MG) BY MOUTH DAILY Runell Gess, MD Taking Active Self  aspirin 81 MG EC tablet [id number] Yes Take 81 mg by mouth daily. Swallow whole. [provider] Taking Active Self  Aspirin-Acetaminophen-Caffeine (GOODY HEADACHE PO) [id number]  Take 1 packet by mouth daily as needed (headaches). [provider]  Active Self  blood glucose meter kit and supplies KIT [id number] Yes  Dispense based on patient and insurance preference. Use up to four times daily as directed. [name], MD Taking Active Self  Blood Glucose Monitoring Suppl DEVI [id number] Yes 1 each by Does not apply route in the morning, at noon, and at bedtime. May substitute to any manufacturer covered by patient's insurance. [name], MD Taking Active Self  Calcium Carb-Cholecalciferol (CALCIUM 500 + D PO) [id number] Yes Take 1 tablet by mouth daily. [provider] Taking Active Self  clobetasol ointment (TEMOVATE) 0.05 % [id number] Yes Apply 1 Application topically 2 (two) times daily.  Patient taking differently: Apply 1 Application topically 2 (two) times daily as needed (rash).   [name], MD Taking Active Self  ferrous sulfate 325 (65 FE) MG EC tablet [id number] Yes Take 325 mg by mouth daily. [provider] Taking Active Self  Glucose Blood (BLOOD GLUCOSE TEST STRIPS) STRP [id number] Yes 1 each by In Vitro route in the morning, at noon, and at bedtime. May substitute to any manufacturer covered by patient's insurance. [name], MD Taking Active Self  Lancets Misc. MISC [id number] Yes 1 each by Does not apply route in the morning, at noon, and at bedtime. May substitute to any manufacturer covered by patient's insurance. [name], MD Taking Active Self  loratadine (CLARITIN) 10 MG tablet [id number] Yes Take 1 tablet (10 mg total) by mouth daily.  Patient taking differently: Take 10 mg by mouth daily as needed for allergies.   [name], MD Taking Active Self  mesalamine (LIALDA) 1.2 g EC tablet [id number] Yes TAKE 4 TABLETS(4.8 GRAMS) BY MOUTH DAILY WITH BREAKFAST Cirigliano, Vito V, DO Taking Active Self  morphine (MSIR) 15 MG tablet [id number] Yes Take 0.5 tablets (7.5 mg total)  by mouth every 4 (four) hours as needed for up to 7 days for severe pain. London Sheer, MD Taking Active   Multiple Vitamins-Minerals (CENTRUM ADULTS PO) [id number] Yes  Take 1 tablet by mouth daily.  [provider] Taking Active Self  NON FORMULARY [id number] Yes Diltiazem 2%/Lidocaine5% compound Use 3 x rectally daily for 2 months to heal anal fissure  Patient taking differently: Place 1 Application rectally daily as needed (anal fissure). Diltiazem 2%/Lidocaine5% compound   Doree Albee, [place] Taking Active Self  olmesartan (BENICAR) 5 MG tablet [id number] Yes TAKE 2 TABLETS BY MOUTH DAILY. NEED APPOINTMENT [name], MD Taking Active Self  Omega-3 Fatty Acids (FISH OIL PO) [id number] Yes Take 700 mg by mouth daily. [provider] Taking Active Self  pantoprazole (PROTONIX) 40 MG tablet [id number] Yes Take 1 tablet (40 mg total) by mouth 2 (two) times daily before a meal. Doree Albee, PA-C Taking Active Self  polyethylene glycol (MIRALAX / GLYCOLAX) 17 g packet [id number]  Take 17 g by mouth daily for 14 days. London Sheer, MD  Active   potassium chloride SA (KLOR-CON M) 20 MEQ tablet [id number] Yes TAKE 1 TABLET(20 MEQ) BY MOUTH DAILY [name], MD Taking Active   [facility] OP [id number] Yes Place 1 drop into both eyes daily as needed (irritation). [provider] Taking Active Self  rosuvastatin (CRESTOR) 20 MG tablet [id number] Yes TAKE 1 TABLET(20 MG) BY MOUTH DAILY Runell Gess, MD Taking Active Self  senna (SENOKOT) 8.6 MG TABS tablet [id number] Yes Take 1 tablet (8.6 mg total) by mouth 2 (two) times daily for 14 days. London Sheer, MD Taking Active   sucralfate (CARAFATE) 1 g tablet [id number] Yes TAKE 1 TABLET(1 GRAM) BY MOUTH FOUR TIMES DAILY AT BEDTIME WITH MEALS Doree Albee, [place] Taking Active Self  triamcinolone cream (KENALOG) 0.5 % [id number] Yes APPLY TOPICALLY TO THE AFFECTED AREA TWICE DAILY AS NEEDED FOR DRY SKIN [name], MD Taking Active Self  vitamin B-12 (CYANOCOBALAMIN) 1000 MCG tablet [id number] Yes Take 1,000 mcg by mouth daily. [provider]  Taking Active Self            Home Care and Equipment/Supplies: Were Home Health Services Ordered?: NA Any new equipment or medical supplies ordered?: NA  Functional Questionnaire: Do you need assistance with bathing/showering or dressing?: No Do you need assistance with meal preparation?: No Do you need assistance with eating?: No Do you have difficulty maintaining continence: No Do you need assistance with getting out of bed/getting out of a chair/moving?: No Do you have difficulty managing or taking your medications?: No  Follow up appointments reviewed: PCP Follow-up appointment confirmed?: NA Specialist Hospital Follow-up appointment confirmed?: Yes Date of Specialist follow-up appointment?: 12/21/22 Follow-Up Specialty Provider:: Dr. Christell Constant Do you need transportation to your follow-up appointment?: No (pt confirms her daughter will take her to appts) Do you understand care options if your condition(s) worsen?: Yes-patient verbalized understanding  SDOH Interventions Today    Flowsheet Row Most Recent Value  SDOH Interventions   Food Insecurity Interventions Intervention Not Indicated  Transportation Interventions Intervention Not Indicated      TOC Interventions Today    Flowsheet Row Most Recent Value  TOC Interventions   TOC Interventions Discussed/Reviewed TOC Interventions Discussed, Post op wound/incision care, S/S of infection, Post discharge activity limitations per provider      Interventions Today    Flowsheet Row Most Recent Value  General Interventions  General Interventions Discussed/Reviewed General Interventions Discussed, Doctor Visits  Doctor Visits Discussed/Reviewed Doctor Visits Discussed, PCP, Specialist  PCP/Specialist Visits Compliance with follow-up visit  Education Interventions   Education Provided Provided Education  Provided Verbal Education On Nutrition, When to see the doctor, Medication, Other  [pain mgmt, bowel mgmt]   Nutrition Interventions   Nutrition Discussed/Reviewed Nutrition Discussed, Decreasing sugar intake, Adding fruits and vegetables, Increasing proteins, Decreasing salt, Decreasing fats  Pharmacy Interventions   Pharmacy Dicussed/Reviewed Pharmacy Topics Discussed, Medications and their functions  Safety Interventions   Safety Discussed/Reviewed Safety Discussed, Home Safety       Alessandra Grout [facility] Care Management Care Management Community Coordinator Direct Phone: [phone] Toll Free: [phone] Fax: [phone]

## 2022-12-21 ENCOUNTER — Encounter: Payer: Medicare Other | Admitting: Orthopedic Surgery

## 2022-12-23 ENCOUNTER — Ambulatory Visit (INDEPENDENT_AMBULATORY_CARE_PROVIDER_SITE_OTHER): Payer: Medicare Other | Admitting: Orthopedic Surgery

## 2022-12-23 ENCOUNTER — Other Ambulatory Visit (INDEPENDENT_AMBULATORY_CARE_PROVIDER_SITE_OTHER): Payer: Medicare Other

## 2022-12-23 DIAGNOSIS — Z981 Arthrodesis status: Secondary | ICD-10-CM

## 2022-12-23 MED ORDER — HYDROMORPHONE HCL 2 MG PO TABS: 1.0000 mg | ORAL_TABLET | ORAL | 0 refills | Status: AC | PRN

## 2022-12-23 NOTE — Progress Notes (Signed)
Orthopedic Surgery Post-operative Office Visit  Procedure: L3-5 laminectomy and PSIF Date of Surgery: 12/08/2022 (~2 weeks post-op)  Assessment: Patient is a 77 y.o. who has noted improvement in her pain after surgery   Plan: -Operative plans complete -Out of bed as tolerated, no brace -Okay to let soap/water run over incision, do not submerge -No bending/lifting/twisting greater than 10 pounds -Pain management: weaning oxycodone, tylenol -Return to office in 4 weeks, x-rays needed at next visit: AP/lateral lumbar  ___________________________________________________________________________   Subjective: Patient's pain has been getting better since discharge from the hospital.  She is not having any radiating leg pain anymore.  She has noted sensation returning in her right leg.  Her back pain is slowly getting better.  She is taking one 1/2 tablet of morphine per day.  Has not noticed any redness or drainage around her incision.  Objective:  General: no acute distress, appropriate affect Neurologic: alert, answering questions appropriately, following commands Respiratory: unlabored breathing on room air Skin: incision is well-approximated with no erythema, induration, active/expressible drainage  MSK (spine):  -Strength exam      Left  Right  EHL    5/5  5/5 TA    5/5  5/5 GSC    5/5  5/5 Knee extension  5/5  5/5 Hip flexion   5/5  5/5  -Sensory exam    Sensation intact to light touch in L3-S1 nerve distributions of bilateral lower extremities  Imaging: X-rays of the lumbar spine taken 12/23/2022 were independently reviewed and interpreted, showing instrumentation from L3-5 in appropriate position. The screws do not have any lucency around them.  Screws of not backed out. Slight lumbar scoliotic curvature which is unchanged from pre-op. Laminectomy defect from L3-L5.   Patient name: Angela Pugh Patient MRN: [id number] Date of visit: 12/23/22

## 2022-12-26 ENCOUNTER — Other Ambulatory Visit: Payer: Self-pay | Admitting: Family Medicine

## 2022-12-31 ENCOUNTER — Other Ambulatory Visit: Payer: Medicare Other

## 2023-01-01 ENCOUNTER — Other Ambulatory Visit: Payer: Self-pay | Admitting: Physician Assistant

## 2023-01-08 NOTE — Progress Notes (Signed)
01/08/2023 Name: Angela Pugh MRN: [id number] DOB: [birth year]  Chief Complaint  Patient presents with   Hypertension   Angela Pugh is a 77 y.o. year old female who presented for a telephone visit to establish care with [facility] Pharm[facility]  Subjective:  Care Team: Primary Care Provider: [name], MD   Medication Access/Adherence  Current Pharmacy:  [facility] [phone] [place], [place] - [address] AT [facility] [address] [facility] [address] [place] [address] Phone: [phone] Fax: [phone]  -Patient reports affordability concerns with their medications: No  -Patient reports access/transportation concerns to their pharmacy: No  -Patient reports adherence concerns with their medications:  No    Hypertension: Current medications: amlodipine 5mg  daily, olmesartan 5mg  daily -Patient has a validated, automated, upper arm home BP cuff -Current blood pressure readings readings: 141/78 -Patient denies hypotensive s/sx including dizziness, lightheadedness.  -Patient denies hypertensive symptoms including headache, chest pain, shortness of breath  Diabetes -Managed with lifestyle modifications -Does not endorse s/sx of any hypo or hyperglycemia  Hyperlipidemia Current medications:  rosuvastatin 20mg  daily -Taking ASA 81mg  daily -Unable to tolerate atorvastatin (nausea and weakness)  Objective: Lab Results  Component Value Date   HGBA1C 6.4 (H) 12/01/2022   Lab Results  Component Value Date   CREATININE 0.93 12/09/2022   BUN <5 (L) 12/09/2022   NA 142 12/09/2022   K 3.3 (L) 12/09/2022   CL 104 12/09/2022   CO2 28 12/09/2022   Lab Results  Component Value Date   CHOL 205 (H) 06/23/2022   HDL 56 06/23/2022   LDLCALC 119 (H) 06/23/2022   TRIG 170 (H) 06/23/2022   CHOLHDL 3.7 06/23/2022   Medications Reviewed Today     Reviewed by Lenna Gilford, RPH (Pharmacist) on 12/31/22 at 0912  Med List Status: <None>    Medication Order Taking? Sig Documenting Provider Last Dose Status Informant  amLODipine (NORVASC) 5 MG tablet [id number] Yes TAKE 1 TABLET(5 MG) BY MOUTH DAILY Runell Gess, MD Taking Active Self  aspirin 81 MG EC tablet [id number] Yes Take 81 mg by mouth daily. Swallow whole. [provider] Taking Active Self  Aspirin-Acetaminophen-Caffeine (GOODY HEADACHE PO) [id number] Yes Take 1 packet by mouth daily as needed (headaches). [provider] Taking Active Self  Blood Glucose Monitoring Suppl DEVI [id number] Yes 1 each by Does not apply route in the morning, at noon, and at bedtime. May substitute to any manufacturer covered by patient's insurance. [name], MD Taking Active Self  Calcium Carb-Cholecalciferol (CALCIUM 500 + D PO) [id number] Yes Take 1 tablet by mouth daily. [provider] Taking Active Self  clobetasol ointment (TEMOVATE) 0.05 % [id number] Yes Apply 1 Application topically 2 (two) times daily.  Patient taking differently: Apply 1 Application topically 2 (two) times daily as needed (rash).   [name], MD Taking Active Self  ferrous sulfate 325 (65 FE) MG EC tablet [id number] Yes Take 325 mg by mouth daily. [provider] Taking Active Self  Glucose Blood (BLOOD GLUCOSE TEST STRIPS) STRP [id number] Yes 1 each by In Vitro route in the morning, at noon, and at bedtime. May substitute to any manufacturer covered by patient's insurance. [name], MD Taking Active Self  Lancets Misc. MISC [id number] Yes 1 each by Does not apply route in the morning, at noon, and at bedtime. May substitute to any manufacturer covered by patient's insurance. [name], MD Taking Active Self  loratadine (CLARITIN)  10 MG tablet [id number] Yes Take 1 tablet (10 mg total) by mouth daily.  Patient taking differently: Take 10 mg by mouth daily as needed for allergies.   [name], MD Taking Active Self  mesalamine (LIALDA) 1.2 g EC tablet  [id number] Yes TAKE 4 TABLETS(4.8 GRAMS) BY MOUTH DAILY WITH BREAKFAST Cirigliano, Vito V, DO Taking Active Self  Multiple Vitamins-Minerals (CENTRUM ADULTS PO) [id number] Yes Take 1 tablet by mouth daily.  [provider] Taking Active Self  NON FORMULARY [id number] No Diltiazem 2%/Lidocaine5% compound Use 3 x rectally daily for 2 months to heal anal fissure  Patient not taking: Reported on 12/31/2022   Doree Albee, PA-C Not Taking Active Self           Med Note Littie Deeds, Trany Chernick A   Thu Dec 31, 2022  9:11 AM) Has not needed recently  olmesartan (BENICAR) 5 MG tablet [id number] Yes TAKE 2 TABLETS BY MOUTH DAILY. [name], MD Taking Active   Omega-3 Fatty Acids (FISH OIL PO) [id number] Yes Take 700 mg by mouth daily. [provider] Taking Active Self  pantoprazole (PROTONIX) 40 MG tablet [id number] Yes Take 1 tablet (40 mg total) by mouth 2 (two) times daily before a meal. Doree Albee, PA-C Taking Active Self  potassium chloride SA (KLOR-CON M) 20 MEQ tablet [id number] Yes TAKE 1 TABLET(20 MEQ) BY MOUTH DAILY [name], MD Taking Active   [facility] OP [id number] No Place 1 drop into both eyes daily as needed (irritation).  Patient not taking: Reported on 12/31/2022   [provider] Not Taking Active Self           Med Note Littie Deeds, Najwa Spillane A   Thu Dec 31, 2022  9:11 AM) Only if needed- hasn't used recently  rosuvastatin (CRESTOR) 20 MG tablet [id number] Yes TAKE 1 TABLET(20 MG) BY MOUTH DAILY Runell Gess, MD Taking Active Self  sucralfate (CARAFATE) 1 g tablet [id number] Yes TAKE 1 TABLET(1 GRAM) BY MOUTH FOUR TIMES DAILY AT BEDTIME WITH MEALS Doree Albee, [place] Taking Active Self  triamcinolone cream (KENALOG) 0.5 % [id number] Yes APPLY TOPICALLY TO THE AFFECTED AREA TWICE DAILY AS NEEDED FOR DRY SKIN [name], MD Taking Active Self  vitamin B-12 (CYANOCOBALAMIN) 1000 MCG tablet [id number] Yes Take 1,000 mcg by mouth  daily. [provider] Taking Active Self           Assessment/Plan:   Hypertension: - Currently uncontrolled - Continue current regimen and regular monitoring/recording of home BP - If BP remains elevated (>130/80) at next PCP visit, I recommend increasing olmesartan to 10mg  daily  Diabetes -Currently controlled -Continue lifestyle management  Hyperlipidemia -Currently moderately controlled -Could consider increasing rosuvastatin to 40mg  daily in an attempt to achieve LDL<70   Follow Up Plan: None scheduled at this time but can as needed per PCP  Lenna Gilford, PharmD, DPLA

## 2023-01-12 ENCOUNTER — Ambulatory Visit (INDEPENDENT_AMBULATORY_CARE_PROVIDER_SITE_OTHER): Payer: Medicare Other | Admitting: Podiatry

## 2023-01-12 ENCOUNTER — Other Ambulatory Visit: Payer: Self-pay | Admitting: Family Medicine

## 2023-01-12 DIAGNOSIS — M79674 Pain in right toe(s): Secondary | ICD-10-CM | POA: Diagnosis not present

## 2023-01-12 DIAGNOSIS — M79675 Pain in left toe(s): Secondary | ICD-10-CM | POA: Diagnosis not present

## 2023-01-12 DIAGNOSIS — E1151 Type 2 diabetes mellitus with diabetic peripheral angiopathy without gangrene: Secondary | ICD-10-CM

## 2023-01-12 DIAGNOSIS — L84 Corns and callosities: Secondary | ICD-10-CM | POA: Diagnosis not present

## 2023-01-12 DIAGNOSIS — B351 Tinea unguium: Secondary | ICD-10-CM

## 2023-01-12 NOTE — Progress Notes (Unsigned)
Subjective:  Patient ID: Angela Pugh, female    DOB: 05-09-1946,  MRN: [id number]  Angela Pugh presents to clinic today for:  Chief Complaint  Patient presents with   Nail Problem    [facility]  To Patient notes nails are thick and elongated, causing pain in shoe gear when ambulating.  She also has painful corns on the plantar aspect of both feet  PCP is [name], MD. last seen on 09/02/2022  Past Medical History:  Diagnosis Date   Arthritis    back, knees, shoulder, hands , injections in pelvis, for bone spurs (05/04/2016)   Chronic back pain    Diverticulosis    Dysrhythmia    LBBB   GERD (gastroesophageal reflux disease)    History of kidney stones    HLD (hyperlipidemia)    Hypertension    Migraine    hx (05/04/2016)   Peripheral neuropathy    Radiculopathy    Sinus complaint    Sinus headache    occasional (05/04/2016)   Type II diabetes mellitus (HCC)    diet controlled, no meds now, was on insulin & po treatment at one time     Vertigo    Allergies  Allergen Reactions   Symbicort [Budesonide-Formoterol Fumarate] Shortness Of Breath, Swelling and Other (See Comments)    Pharyngeal edema, hoarseness, tightness in chest, lips became dark.   Acetaminophen Hives and Nausea Only   Azithromycin Hives and Nausea And Vomiting   Lipitor [Atorvastatin] Nausea Only    weakness   Pregabalin Nausea And Vomiting    dizziness   Zyrtec [Cetirizine] Hives   Review of Systems: Negative except as noted in the HPI.  Objective:  There were no vitals filed for this visit.  Angela Pugh is a pleasant 77 y.o. female in NAD. AAO x 3.  Vascular Examination: Patient has palpable DP pulse, absent PT pulse bilateral.  Delayed capillary refill bilateral toes.  Sparse digital hair bilateral.  Proximal to distal cooling WNL bilateral.    Dermatological Examination: Interspaces are clear with no open lesions noted bilateral.  Skin is shiny and atrophic  bilateral.  Nails are 3-32mm thick, with yellowish/brown discoloration, subungual debris and distal onycholysis x10.  There is pain with compression of nails x10.  There are hyperkeratotic lesions noted x 4 to the plantar aspect of the metatarsal head area bilateral.     Latest Ref Rng & Units 12/01/2022   10:00 AM  Hemoglobin A1C  Hemoglobin-A1c 4.8 - 5.6 % 6.4    Patient qualifies for at-risk foot care because of diabetes with PVD.  Assessment/Plan: 1. Pain due to onychomycosis of toenails of both feet   2. Type II diabetes mellitus with peripheral circulatory disorder (HCC)   3. Corns      Mycotic nails x10 were sharply debrided with sterile nail nippers and power debriding burr to decrease bulk and length.  Hyperkeratotic lesions x 4 were shaved with #312 blade.  Salinocaine and a Band-Aid were applied to the IPK lesions on the plantar aspect of both feet.  She can remove this closer to bedtime today.  Return in about 3 months (around 04/14/2023) for [facility].   Clerance Lav, DPM, FACFAS [facility]     [address]  [place], [place] [address]                Office [phone]  Fax [phone]

## 2023-01-14 ENCOUNTER — Other Ambulatory Visit: Payer: Self-pay | Admitting: Family Medicine

## 2023-01-20 ENCOUNTER — Ambulatory Visit (INDEPENDENT_AMBULATORY_CARE_PROVIDER_SITE_OTHER): Payer: Medicare Other | Admitting: Orthopedic Surgery

## 2023-01-20 ENCOUNTER — Other Ambulatory Visit (INDEPENDENT_AMBULATORY_CARE_PROVIDER_SITE_OTHER): Payer: Medicare Other

## 2023-01-20 DIAGNOSIS — Z981 Arthrodesis status: Secondary | ICD-10-CM

## 2023-01-20 MED ORDER — CODEINE SULFATE 30 MG PO TABS
30.0000 mg | ORAL_TABLET | Freq: Four times a day (QID) | ORAL | 0 refills | Status: AC | PRN
Start: 2023-01-20 — End: 2023-01-25

## 2023-01-20 NOTE — Progress Notes (Signed)
Orthopedic Surgery Post-operative Office Visit   Procedure: L3-5 laminectomy and PSIF Date of Surgery: 12/08/2022 (~6 weeks post-op)   Assessment: Patient is a 77 y.o. who has noted improvement in her preoperative symptoms since surgery     Plan: -Operative plans complete -Out of bed as tolerated, no brace -Okay to submerge wound at this time -Recommended voltaren gel over the lateral aspect of the right thigh for desensitization -Prescribed codeine for use sparingly -No bending/lifting/twisting greater than 10 pounds -Return to office in 6 weeks, x-rays needed at next visit: AP/lateral/flex/ex lumbar   ___________________________________________________________________________     Subjective: Patient is noted continued improvement since she was last seen.  She still has some pain and decreased sensation over the lateral aspect of her right thigh.  The remainder of her leg symptoms have resolved.  She is not having any pain in the left lower extremity.  She does have some lower back pain that waxes and wanes in terms of intensity.  She had some codeine from Dr. Otelia Sergeant that she has used on bad days and that has been helpful. Today, she said she is not having much pain.  No paresthesias or numbness besides the decree sensation over the lateral aspect of her right thigh.  Has not noticed any redness or drainage from her incision. Overall, she is pleased with her progress thus far.     Objective:   General: no acute distress, appropriate affect Neurologic: alert, answering questions appropriately, following commands Respiratory: unlabored breathing on room air Skin: incision is well healed with no erythema, induration, active/expressible drainage   MSK (spine):   -Strength exam                                                   Left                  Right   EHL                              5/5                  5/5 TA                                 5/5                  5/5 GSC                              5/5                  5/5 Knee extension            5/5                  5/5 Hip flexion                    5/5                  5/5   -Sensory exam  Sensation intact to light touch in L3-S1 nerve distributions of bilateral lower extremities   Imaging: X-rays of the lumbar spine taken 01/20/2023 were independently reviewed and interpreted, showing posterior instrumentation from L3-L5.  None screws have backed out and there is no lucency seen around the screws.  Slight lumbar scoliotic curvature which is unchanged from pre-op. Laminectomy defect from L3-L5.     Patient name: Angela Pugh Patient MRN: [id number] Date of visit:01/20/23

## 2023-02-03 ENCOUNTER — Other Ambulatory Visit: Payer: Self-pay | Admitting: Gastroenterology

## 2023-02-03 DIAGNOSIS — R1013 Epigastric pain: Secondary | ICD-10-CM

## 2023-02-12 ENCOUNTER — Telehealth: Payer: Self-pay | Admitting: Physician Assistant

## 2023-02-12 ENCOUNTER — Ambulatory Visit: Payer: Medicare Other | Admitting: Family Medicine

## 2023-02-12 ENCOUNTER — Encounter: Payer: Self-pay | Admitting: Family Medicine

## 2023-02-12 VITALS — BP 138/75 | HR 59 | Temp 98.6°F | Ht 68.0 in | Wt 170.2 lb

## 2023-02-12 DIAGNOSIS — R079 Chest pain, unspecified: Secondary | ICD-10-CM

## 2023-02-12 DIAGNOSIS — E1169 Type 2 diabetes mellitus with other specified complication: Secondary | ICD-10-CM

## 2023-02-12 DIAGNOSIS — Z780 Asymptomatic menopausal state: Secondary | ICD-10-CM

## 2023-02-12 DIAGNOSIS — Z23 Encounter for immunization: Secondary | ICD-10-CM | POA: Diagnosis not present

## 2023-02-12 DIAGNOSIS — I10 Essential (primary) hypertension: Secondary | ICD-10-CM

## 2023-02-12 DIAGNOSIS — R1084 Generalized abdominal pain: Secondary | ICD-10-CM | POA: Diagnosis not present

## 2023-02-12 DIAGNOSIS — E782 Mixed hyperlipidemia: Secondary | ICD-10-CM

## 2023-02-12 DIAGNOSIS — L853 Xerosis cutis: Secondary | ICD-10-CM | POA: Diagnosis not present

## 2023-02-12 DIAGNOSIS — Z1159 Encounter for screening for other viral diseases: Secondary | ICD-10-CM

## 2023-02-12 DIAGNOSIS — Z981 Arthrodesis status: Secondary | ICD-10-CM | POA: Diagnosis not present

## 2023-02-12 DIAGNOSIS — Z0001 Encounter for general adult medical examination with abnormal findings: Secondary | ICD-10-CM | POA: Diagnosis not present

## 2023-02-12 DIAGNOSIS — Z0000 Encounter for general adult medical examination without abnormal findings: Secondary | ICD-10-CM

## 2023-02-12 LAB — COMPREHENSIVE METABOLIC PANEL
ALT: 10 U/L (ref 0–35)
AST: 17 U/L (ref 0–37)
Albumin: 4.1 g/dL (ref 3.5–5.2)
Alkaline Phosphatase: 75 U/L (ref 39–117)
BUN: 9 mg/dL (ref 6–23)
CO2: 31 meq/L (ref 19–32)
Calcium: 9.7 mg/dL (ref 8.4–10.5)
Chloride: 102 meq/L (ref 96–112)
Creatinine, Ser: 0.84 mg/dL (ref 0.40–1.20)
GFR: 67.48 mL/min (ref >60.00)
Glucose, Bld: 107 mg/dL — ABNORMAL HIGH (ref 70–99)
Potassium: 3.1 meq/L — ABNORMAL LOW (ref 3.5–5.1)
Sodium: 143 meq/L (ref 135–145)
Total Bilirubin: 0.5 mg/dL (ref 0.2–1.2)
Total Protein: 8.1 g/dL (ref 6.0–8.3)

## 2023-02-12 LAB — MICROALBUMIN / CREATININE URINE RATIO
Creatinine,U: 176.7 mg/dL
Microalb Creat Ratio: 1.9 mg/g (ref 0.0–30.0)
Microalb, Ur: 3.3 mg/dL — ABNORMAL HIGH (ref 0.0–1.9)

## 2023-02-12 LAB — CBC WITH DIFFERENTIAL/PLATELET
Basophils Absolute: 0.1 10*3/uL (ref 0.0–0.1)
Basophils Relative: 0.9 % (ref 0.0–3.0)
Eosinophils Absolute: 0.2 10*3/uL (ref 0.0–0.7)
Eosinophils Relative: 2.5 % (ref 0.0–5.0)
HCT: 44.6 % (ref 36.0–46.0)
Hemoglobin: 14.6 g/dL (ref 12.0–15.0)
Lymphocytes Relative: 52.8 % — ABNORMAL HIGH (ref 12.0–46.0)
Lymphs Abs: 3.3 10*3/uL (ref 0.7–4.0)
MCHC: 32.8 g/dL (ref 30.0–36.0)
MCV: 94.1 fL (ref 78.0–100.0)
Monocytes Absolute: 0.6 10*3/uL (ref 0.1–1.0)
Monocytes Relative: 9.7 % (ref 3.0–12.0)
Neutro Abs: 2.1 10*3/uL (ref 1.4–7.7)
Neutrophils Relative %: 34.1 % — ABNORMAL LOW (ref 43.0–77.0)
Platelets: 302 10*3/uL (ref 150.0–400.0)
RBC: 4.74 Mil/uL (ref 3.87–5.11)
RDW: 13.5 % (ref 11.5–15.5)
WBC: 6.2 10*3/uL (ref 4.0–10.5)

## 2023-02-12 LAB — LIPID PANEL
Cholesterol: 191 mg/dL (ref 0–200)
HDL: 66.7 mg/dL (ref >39.00)
LDL Cholesterol: 97 mg/dL (ref 0–99)
NonHDL: 124.38
Total CHOL/HDL Ratio: 3
Triglycerides: 136 mg/dL (ref 0.0–149.0)
VLDL: 27.2 mg/dL (ref 0.0–40.0)

## 2023-02-12 LAB — TSH: TSH: 1.88 u[IU]/mL (ref 0.35–5.50)

## 2023-02-12 LAB — T4, FREE: Free T4: 1.02 ng/dL (ref 0.60–1.60)

## 2023-02-12 MED ORDER — TRIAMCINOLONE ACETONIDE 0.5 % EX CREA
TOPICAL_CREAM | Freq: Two times a day (BID) | CUTANEOUS | 3 refills | Status: DC
Start: 2023-02-12 — End: 2024-03-06

## 2023-02-12 NOTE — Telephone Encounter (Signed)
Inbound call from patient stating she has been experiencing extreme bloating. States she has also lost her appetite. Advised patient of soonest appointment. Patient states she would like a call to discuss option to help. Please advise, thank you.

## 2023-02-12 NOTE — Progress Notes (Signed)
Established Patient Office Visit   Subjective  Patient ID: Angela Pugh, female    DOB: Jun 16, 1945  Age: 77 y.o. MRN: [id number]  Chief Complaint  Patient presents with   Annual Exam    Patient is a 77 year old female seen for CPE and follow-up on ongoing concerns.  Patient  s/p lumbar spinal fusion.  Doing well.  No longer having pain.  Patient endorses LUE sharp, shooting pain last week.  Also had CP one night.  Took 2 ASA.  Still having stomach issues.  States stomach "griping".  Woke up around 3 AM with abdominal pain.  Had small soft stool.  No appetite/no taste for food.  Seen by GI.  Medications prescribed not helping.  Taking olmesartan 10 mg and Norvasc 5 mg for HTN.    Past Medical History:  Diagnosis Date   Arthritis    back, knees, shoulder, hands , injections in pelvis, for bone spurs (05/04/2016)   Chronic back pain    Diverticulosis    Dysrhythmia    LBBB   GERD (gastroesophageal reflux disease)    History of kidney stones    HLD (hyperlipidemia)    Hypertension    Migraine    hx (05/04/2016)   Peripheral neuropathy    Radiculopathy    Sinus complaint    Sinus headache    occasional (05/04/2016)   Type II diabetes mellitus (HCC)    diet controlled, no meds now, was on insulin & po treatment at one time     Vertigo    Past Surgical History:  Procedure Laterality Date   ANTERIOR CERVICAL DECOMP/DISCECTOMY FUSION N/A 04/17/2016   Procedure: ANTERIOR CERVICAL DECOMPRESSION/DISCECTOMY FUSION CERVICAL THREE - CERVICAL FOUR, POSTERIOR CERVICAL DISCECTOMY CERVICAL SEVEN -THORASIC ONE LEFT;  Surgeon: Coletta Memos, MD;  Location: [facility] OR;  Service: Neurosurgery;  Laterality: N/A;  Anterior/Posterior   ANTERIOR FUSION CERVICAL SPINE  2000   Dr. Channing Mutters; "for bone spurs; put hardware in too"   COLONOSCOPY  06/10/2016   [facility]   COLONOSCOPY  2023   LASIK Bilateral    LEFT HEART CATH AND CORONARY ANGIOGRAPHY N/A 03/17/2018   Procedure: LEFT HEART CATH AND CORONARY  ANGIOGRAPHY;  Surgeon: Runell Gess, MD;  Location: [facility] INVASIVE CV LAB;  Service: Cardiovascular;  Laterality: N/A;   LUMBAR LAMINECTOMY Left 06/30/2013   Procedure: Left L3-4 Extraforaminal approach to excise far lateral herniated nucleus pulposus;  Surgeon: Kerrin Champagne, MD;  Location: [facility] OR;  Service: Orthopedics;  Laterality: Left;   TUBAL LIGATION     VAGINAL HYSTERECTOMY     Social History   Tobacco Use   Smoking status: Some Days    Current packs/day: 0.00    Average packs/day: 0.5 packs/day for 20.0 years (10.0 ttl pk-yrs)    Types: Cigarettes    Start date: 06/30/1962    Last attempt to quit: 06/30/1982    Years since quitting: 40.6   Smokeless tobacco: Never   Tobacco comments:    Patient only smokes in social setting when drinking. Admits former h/o heavy smoking years ago smoked 1.5 packs/day.    12/04/2021 Patient states she has not spoke for about 3 weeks    12/01/2022: pt will have a few drags if she is having a drink  Vaping Use   Vaping status: Never Used  Substance Use Topics   Alcohol use: Yes    Comment: special occasion   Drug use: No   Family History  Problem Relation Age of Onset  Uterine cancer Mother    Diabetes type II Sister    Diabetes Mellitus II Brother    Diverticulitis Daughter    Heart Problems Daughter    SIDS Son    Colon cancer Neg Hx    Pancreatic cancer Neg Hx    Esophageal cancer Neg Hx    Colon polyps Neg Hx    Rectal cancer Neg Hx    Stomach cancer Neg Hx    Allergies  Allergen Reactions   Symbicort [Budesonide-Formoterol Fumarate] Shortness Of Breath, Swelling and Other (See Comments)    Pharyngeal edema, hoarseness, tightness in chest, lips became dark.   Acetaminophen Hives and Nausea Only   Azithromycin Hives and Nausea And Vomiting   Lipitor [Atorvastatin] Nausea Only    weakness   Pregabalin Nausea And Vomiting    dizziness   Zyrtec [Cetirizine] Hives      ROS Negative unless stated above    Objective:      BP (!) 140/86 (BP Location: Left Arm, Patient Position: Sitting, Cuff Size: Normal)   Pulse (!) 59   Temp 98.6 F (37 C) (Oral)   Ht 5\' 8"  (1.727 m)   Wt 170 lb 3.2 oz (77.2 kg)   SpO2 97%   BMI 25.88 kg/m  BP Readings from Last 3 Encounters:  02/12/23 (!) 140/86  12/13/22 (!) 142/78  12/01/22 (!) 173/78   Wt Readings from Last 3 Encounters:  02/12/23 170 lb 3.2 oz (77.2 kg)  12/08/22 176 lb (79.8 kg)  12/01/22 177 lb (80.3 kg)      Physical Exam Constitutional:      Appearance: Normal appearance.  HENT:     Head: Normocephalic and atraumatic.     Right Ear: Tympanic membrane, ear canal and external ear normal.     Left Ear: Tympanic membrane, ear canal and external ear normal.     Nose: Nose normal.     Mouth/Throat:     Mouth: Mucous membranes are moist.     Pharynx: No oropharyngeal exudate or posterior oropharyngeal erythema.  Eyes:     General: No scleral icterus.    Extraocular Movements: Extraocular movements intact.     Conjunctiva/sclera: Conjunctivae normal.     Pupils: Pupils are equal, round, and reactive to light.  Neck:     Thyroid: No thyromegaly.  Cardiovascular:     Rate and Rhythm: Normal rate and regular rhythm.     Pulses: Normal pulses.     Heart sounds: Normal heart sounds. No murmur heard.    No friction rub.  Pulmonary:     Effort: Pulmonary effort is normal.     Breath sounds: Normal breath sounds. No wheezing, rhonchi or rales.  Abdominal:     General: Bowel sounds are normal.     Palpations: Abdomen is soft.     Tenderness: There is no abdominal tenderness.  Musculoskeletal:        General: No deformity. Normal range of motion.  Lymphadenopathy:     Cervical: No cervical adenopathy.  Skin:    General: Skin is warm and dry.     Findings: No lesion.  Neurological:     General: No focal deficit present.     Mental Status: She is alert and oriented to person, place, and time.  Psychiatric:        Mood and Affect: Mood normal.         Thought Content: Thought content normal.     No results found for any visits on  02/12/23.    Assessment & Plan:  Encounter for preventive care -Age-appropriate health screenings discussed -Will obtain labs -Mammogram done 07/07/2022 -Colonoscopy done 04/26/2020 -Pap not indicated -Immunizations reviewed. -Next CPE in 1 year.  Annual AWV's.  Need for influenza vaccination -     Flu Vaccine Trivalent High Dose (Fluad)  Type 2 diabetes mellitus with other specified complication, without long-term current use of insulin (HCC) -Diet controlled -Hemoglobin A1c 6.4% on 12/01/2022 -Eye exam and foot exam up-to-date. -Continue statin and ARB -     Microalbumin / creatinine urine ratio -     Lipid panel  Primary hypertension -Elevated -Recheck -Discussed importance of lifestyle modifications -Continue current medications including Norvasc 5 mg and Benicar 10 mg -     Comprehensive metabolic panel -     TSH -     T4, free  Dry skin -     Triamcinolone Acetonide; Apply topically 2 (two) times daily.  Dispense: 30 g; Refill: 3  Generalized abdominal pain -Given continued symptoms discussed the importance of setting up follow-up appointment with GI -Continue current medications -     Comprehensive metabolic panel -     CBC with Differential/Platelet -     TSH -     T4, free  Chest pain, unspecified type -Currently without symptoms -Discussed setting up Cardiology follow-up -Given strict precautions -     Comprehensive metabolic panel -     CBC with Differential/Platelet -     TSH -     T4, free -     Lipid panel  S/P lumbar fusion -Stable  Need for hepatitis C screening test -     Hepatitis C antibody  Mixed hyperlipidemia -Total cholesterol 205, HDL 56, LDL 119, triglycerides 170 on 06/23/2022 -Continue Crestor 20 mg daily -Lifestyle modifications -     Comprehensive metabolic panel -     Lipid panel  Asymptomatic postmenopausal state -     DG Bone Density;  Future   Return in about 4 months (around 06/14/2023), or if symptoms worsen or fail to improve.   [name], MD

## 2023-02-12 NOTE — Patient Instructions (Signed)
Orders for labs were placed.  A referral for a bone density scan was also placed to check for osteoporosis or osteopenia.  Do not forget to call gastroenterology.

## 2023-02-12 NOTE — Telephone Encounter (Signed)
Pt reports she is having issues of waking up every morning around 3am and her stomach is gurgling. She has to eat something and then it settles down a bit. She states she can not eat much, has decreased appetite. States she can eat 2 bites and feel full. Pt scheduled to see Doug Sou PA 02/18/23 at 1:30pm. Pt aware of appt.

## 2023-02-18 ENCOUNTER — Ambulatory Visit (INDEPENDENT_AMBULATORY_CARE_PROVIDER_SITE_OTHER): Payer: Medicare Other | Admitting: Gastroenterology

## 2023-02-18 ENCOUNTER — Ambulatory Visit (INDEPENDENT_AMBULATORY_CARE_PROVIDER_SITE_OTHER)
Admission: RE | Admit: 2023-02-18 | Discharge: 2023-02-18 | Disposition: A | Discharge status: Home / Self Care | Payer: Medicare Other | Source: Ambulatory Visit | Attending: Gastroenterology | Admitting: Gastroenterology

## 2023-02-18 ENCOUNTER — Encounter: Payer: Self-pay | Admitting: Gastroenterology

## 2023-02-18 VITALS — BP 136/80 | HR 67 | Ht 68.0 in | Wt 171.0 lb

## 2023-02-18 DIAGNOSIS — R1084 Generalized abdominal pain: Secondary | ICD-10-CM | POA: Diagnosis not present

## 2023-02-18 DIAGNOSIS — K5900 Constipation, unspecified: Secondary | ICD-10-CM | POA: Diagnosis not present

## 2023-02-18 DIAGNOSIS — R109 Unspecified abdominal pain: Secondary | ICD-10-CM | POA: Diagnosis not present

## 2023-02-18 DIAGNOSIS — Z961 Presence of intraocular lens: Secondary | ICD-10-CM | POA: Diagnosis not present

## 2023-02-18 DIAGNOSIS — H469 Unspecified optic neuritis: Secondary | ICD-10-CM | POA: Diagnosis not present

## 2023-02-18 DIAGNOSIS — H40013 Open angle with borderline findings, low risk, bilateral: Secondary | ICD-10-CM | POA: Diagnosis not present

## 2023-02-18 DIAGNOSIS — E119 Type 2 diabetes mellitus without complications: Secondary | ICD-10-CM | POA: Diagnosis not present

## 2023-02-18 DIAGNOSIS — R140 Abdominal distension (gaseous): Secondary | ICD-10-CM | POA: Diagnosis not present

## 2023-02-18 NOTE — Progress Notes (Signed)
02/18/2023 Angela Pugh [id number] 08/05/1945   HISTORY OF PRESENT ILLNESS:  77 y.o. female who is a patient of Dr. Frankey Shown with a past medical history of chronic GERD, and significant diverticular disease with probable associated SCAD for which she has been on Lialda, echo 12/2021 EF 55-60%, no AS, normal PFTs, cath 2019 normal coronary arteries, and others listed below who presents for evaluation of bloating, nausea, poor appetite, constipation.  Tells me that for a few months now she has been waking up in the middle of the night with her stomach gurgling, she feels like she has to eat something or have a bowel movement.  Says that she is passing small quarter sized balls of stool, 1 at a time.  She does think it helps her stomach calm down when she eats, but she feels like she cannot eat a lot because she is nauseated or just has a poor appetite.  She says sometimes certain food will sound so good, but when then she goes to eat it just makes her nauseous.  Having a lot of gas and bloating.  04/2020 EGD with finding of a small blue bleb in the distal esophagus, mild gastritis, no H. pylori. Gastric biopsies showed focal intestinal metaplasia no dysplasia and no H. pylori. 04/2020 Colonoscopy at that same time with significant chronic diverticular changes multiple small and large diverticuli especially in the sigmoid colon with associated luminal narrowing and spasticity, no polyps. 02/03/2022 EGD for dysphagia and heartburn normal upper third of esophagus, blood found in esophagus, nonobstructive Schatzki ring treated with cold forceps for ring fracturing, mild nonulcer gastritis, single bleeding AVM in duodenum status post MR clips Pathology showed H. pylori gastritis and intestinal metaplasia without dysplasia treated with quad therapy.  No eradication study. 06/29/2022 went to ER with nonspecific chest pain CBC without anemia or leukocytosis, negative troponin, normal kidney and liver,  given Zofran, GI cocktail with improvement of symptoms. 02/12024 CT angio chest abdomen pelvis showed no evidence of aortic aneurysm, high-grade stenosis proximal right common carotid artery, mild coronary artery calcification, emphysema, 3 mm solid pulmonary nodule right middle lobe follow-up recommended, severe sigmoid diverticulosis without diverticulitis, greater than 50% stenosis of main renal arteries bilaterally changes of fibromuscular dysplasia, 50% stenosis celiac artery   07/06/2022 office visit with myself for worsening epigastric pain and dysphagia 07/07/2022 norovirus on GI pathogen panel, normal pancreatic elastase 07/09/2022 EGD with Dr. Barron Alvine vascular bleb distal esophagus, nonobstructing Schatzki ring fractured with forceps, normal stomach, erythematous duodenum, normal second portion of duodenum.  Pathology showed gastritis and peptic duodenitis but negative for H. Pylori   07/24/2022 called complaining of rectal bleeding, bloating and change in bowel habits Levsin without relief and rectal burning  On Lialda 1.2 g 4 times    Past Medical History:  Diagnosis Date   Arthritis    back, knees, shoulder, hands , injections in pelvis, for bone spurs (05/04/2016)   Chronic back pain    Diverticulosis    Dysrhythmia    LBBB   GERD (gastroesophageal reflux disease)    History of kidney stones    HLD (hyperlipidemia)    Hypertension    Migraine    hx (05/04/2016)   Peripheral neuropathy    Radiculopathy    Sinus complaint    Sinus headache    occasional (05/04/2016)   Type II diabetes mellitus (HCC)    diet controlled, no meds now, was on insulin & po treatment at one time  Vertigo    Past Surgical History:  Procedure Laterality Date   ANTERIOR CERVICAL DECOMP/DISCECTOMY FUSION N/A 04/17/2016   Procedure: ANTERIOR CERVICAL DECOMPRESSION/DISCECTOMY FUSION CERVICAL THREE - CERVICAL FOUR, POSTERIOR CERVICAL DISCECTOMY CERVICAL SEVEN -THORASIC ONE LEFT;  Surgeon: Coletta Memos, MD;  Location: [facility] OR;  Service: Neurosurgery;  Laterality: N/A;  Anterior/Posterior   ANTERIOR FUSION CERVICAL SPINE  2000   Dr. Channing Mutters; "for bone spurs; put hardware in too"   COLONOSCOPY  06/10/2016   Gabem   COLONOSCOPY  2023   LASIK Bilateral    LEFT HEART CATH AND CORONARY ANGIOGRAPHY N/A 03/17/2018   Procedure: LEFT HEART CATH AND CORONARY ANGIOGRAPHY;  Surgeon: Runell Gess, MD;  Location: [facility] INVASIVE CV LAB;  Service: Cardiovascular;  Laterality: N/A;   LUMBAR LAMINECTOMY Left 06/30/2013   Procedure: Left L3-4 Extraforaminal approach to excise far lateral herniated nucleus pulposus;  Surgeon: Kerrin Champagne, MD;  Location: [facility] OR;  Service: Orthopedics;  Laterality: Left;   TUBAL LIGATION     VAGINAL HYSTERECTOMY      reports that she has been smoking cigarettes. She started smoking about 60 years ago. She has a 10 pack-year smoking history. She has never used smokeless tobacco. She reports current alcohol use. She reports that she does not use drugs. family history includes Diabetes Mellitus II in her brother; Diabetes type II in her sister; Diverticulitis in her daughter; Heart Problems in her daughter; SIDS in her son; Uterine cancer in her mother. Allergies  Allergen Reactions   Symbicort [Budesonide-Formoterol Fumarate] Shortness Of Breath, Swelling and Other (See Comments)    Pharyngeal edema, hoarseness, tightness in chest, lips became dark.   Acetaminophen Hives and Nausea Only   Azithromycin Hives and Nausea And Vomiting   Lipitor [Atorvastatin] Nausea Only    weakness   Pregabalin Nausea And Vomiting    dizziness   Zyrtec [Cetirizine] Hives      Outpatient Encounter Medications as of 02/18/2023  Medication Sig   ABRYSVO 120 MCG/0.5ML injection    amLODipine (NORVASC) 5 MG tablet TAKE 1 TABLET(5 MG) BY MOUTH DAILY   aspirin 81 MG EC tablet Take 81 mg by mouth daily. Swallow whole.   Aspirin-Acetaminophen-Caffeine (GOODY HEADACHE PO) Take 1 packet by mouth  daily as needed (headaches).   Blood Glucose Monitoring Suppl DEVI 1 each by Does not apply route in the morning, at noon, and at bedtime. May substitute to any manufacturer covered by patient's insurance.   Calcium Carb-Cholecalciferol (CALCIUM 500 + D PO) Take 1 tablet by mouth daily.   clobetasol ointment (TEMOVATE) 0.05 % Apply 1 Application topically 2 (two) times daily. (Patient taking differently: Apply 1 Application topically 2 (two) times daily as needed (rash).)   ferrous sulfate 325 (65 FE) MG EC tablet Take 325 mg by mouth daily.   Glucose Blood (BLOOD GLUCOSE TEST STRIPS) STRP 1 each by In Vitro route in the morning, at noon, and at bedtime. May substitute to any manufacturer covered by patient's insurance.   Lancets Misc. MISC 1 each by Does not apply route in the morning, at noon, and at bedtime. May substitute to any manufacturer covered by patient's insurance.   loratadine (CLARITIN) 10 MG tablet Take 1 tablet (10 mg total) by mouth daily. (Patient taking differently: Take 10 mg by mouth daily as needed for allergies.)   mesalamine (LIALDA) 1.2 g EC tablet Take 4 tablets (4.8 g total) by mouth daily with breakfast. NEEDS OFFICE VISIT FOR FURTHER REFILLS   Multiple Vitamins-Minerals (  CENTRUM ADULTS PO) Take 1 tablet by mouth daily.    NON FORMULARY Diltiazem 2%/Lidocaine5% compound Use 3 x rectally daily for 2 months to heal anal fissure   olmesartan (BENICAR) 5 MG tablet TAKE 2 TABLETS BY MOUTH DAILY   Omega-3 Fatty Acids (FISH OIL PO) Take 700 mg by mouth daily.   pantoprazole (PROTONIX) 40 MG tablet Take 1 tablet (40 mg total) by mouth 2 (two) times daily before a meal.   potassium chloride SA (KLOR-CON M) 20 MEQ tablet TAKE 1 TABLET(20 MEQ) BY MOUTH DAILY   PREDNISOLON-MOXIFLOX-BROMFENAC OP Place 1 drop into both eyes daily as needed (irritation).   PREVNAR 20 0.5 ML injection    rosuvastatin (CRESTOR) 20 MG tablet TAKE 1 TABLET(20 MG) BY MOUTH DAILY   sucralfate (CARAFATE) 1 g  tablet TAKE 1 TABLET(1 GRAM) BY MOUTH FOUR TIMES DAILY WITH MEALS AND AT BEDTIME   traMADol (ULTRAM) 50 MG tablet Take 50 mg by mouth every 6 (six) hours as needed.   triamcinolone cream (KENALOG) 0.5 % Apply topically 2 (two) times daily.   vitamin B-12 (CYANOCOBALAMIN) 1000 MCG tablet Take 1,000 mcg by mouth daily.   No facility-administered encounter medications on file as of 02/18/2023.    REVIEW OF SYSTEMS  : All other systems reviewed and negative except where noted in the History of Present Illness.   PHYSICAL EXAM: BP 136/80   Pulse 67   Ht 5\' 8"  (1.727 m)   Wt 171 lb (77.6 kg)   BMI 26.00 kg/m  General: Well developed female in no acute distress Head: Normocephalic and atraumatic Eyes:  Sclerae anicteric, conjunctiva pink. Ears: Normal auditory acuity Lungs: Clear throughout to auscultation; no W/R/R. Heart: Regular rate and rhythm; no M/R/G. Abdomen: Soft, non-distended.  BS present.  Minimal lower abdominal TTP. Musculoskeletal: Symmetrical with no gross deformities  Skin: No lesions on visible extremities Extremities: No edema  Neurological: Alert oriented x 4, grossly non-focal Psychological:  Alert and cooperative. Normal mood and affect  ASSESSMENT AND PLAN: *77 year old female with history of SCAD on Lialda.  Colonoscopy 04/2020 showed luminal narrowing associated with diverticulosis.  Here today with complaints of constipation, generalized abdominal discomfort, and gas/bloating.  Also complaining of nausea and poor appetite.  She has had an extensive workup, including a CT scan earlier this year.  She is now reporting quarter sized bowel movements, passage of small amounts at a time.  Question constipation related to the luminal narrowing from her diverticulosis.  Will check an abdominal x-ray.  If she was a decent mount of stool burden then we will do MiraLAX purge and have her begin taking MiraLAX daily to keep things moving.  Will give her some samples of IBgard to  try.   CC:  [name], MD

## 2023-02-18 NOTE — Patient Instructions (Signed)
Your provider has requested that you have an abdominal x ray before leaving today. Please go to the basement floor to our Radiology department for the test.  Start IBgard & FDgard.   _______________________________________________________  If your blood pressure at your visit was 140/90 or greater, please contact your primary care physician to follow up on this.  _______________________________________________________  If you are age 76 or older, your body mass index should be between 23-30. Your Body mass index is 26 kg/m. If this is out of the aforementioned range listed, please consider follow up with your Primary Care Provider.  If you are age 76 or younger, your body mass index should be between 19-25. Your Body mass index is 26 kg/m. If this is out of the aformentioned range listed, please consider follow up with your Primary Care Provider.   ________________________________________________________  The [facility] providers would like to encourage you to use [facility] to communicate with providers for non-urgent requests or questions.  Due to long hold times on the telephone, sending your provider a message by [facility] may be a faster and more efficient way to get a response.  Please allow 48 business hours for a response.  Please remember that this is for non-urgent requests.  _______________________________________________________

## 2023-02-19 ENCOUNTER — Ambulatory Visit (INDEPENDENT_AMBULATORY_CARE_PROVIDER_SITE_OTHER): Payer: Medicare Other

## 2023-02-19 VITALS — BP 122/62 | HR 68 | Temp 98.4°F | Ht 68.0 in | Wt 169.6 lb

## 2023-02-19 DIAGNOSIS — Z0000 Encounter for general adult medical examination without abnormal findings: Secondary | ICD-10-CM

## 2023-02-19 NOTE — Patient Instructions (Addendum)
Angela Pugh , Thank you for taking time to come for your Medicare Wellness Visit. I appreciate your ongoing commitment to your health goals. Please review the following plan we discussed and let me know if I can assist you in the future.   Referrals/Orders/Follow-Ups/Clinician Recommendations:  Opioid Pain Medicine Management Opioids are powerful medicines that are used to treat moderate to severe pain. When used for short periods of time, they can help you to: Sleep better. Do better in physical or occupational therapy. Feel better in the first few days after an injury. Recover from surgery. Opioids should be taken with the supervision of a trained health care provider. They should be taken for the shortest period of time possible. This is because opioids can be addictive, and the longer you take opioids, the greater your risk of addiction. This addiction can also be called opioid use disorder. What are the risks? Using opioid pain medicines for longer than 3 days increases your risk of side effects. Side effects include: Constipation. Nausea and vomiting. Breathing difficulties (respiratory depression). Drowsiness. Confusion. Opioid use disorder. Itching. Taking opioid pain medicine for a long period of time can affect your ability to do daily tasks. It also puts you at risk for: Motor vehicle crashes. Depression. Suicide. Heart attack. Overdose, which can be life-threatening. What is a pain treatment plan? A pain treatment plan is an agreement between you and your health care provider. Pain is unique to each person, and treatments vary depending on your condition. To manage your pain, you and your health care provider need to work together. To help you do this: Discuss the goals of your treatment, including how much pain you might expect to have and how you will manage the pain. Review the risks and benefits of taking opioid medicines. Remember that a good treatment plan uses more than  one approach and minimizes the chance of side effects. Be honest about the amount of medicines you take and about any drug or alcohol use. Get pain medicine prescriptions from only one health care provider. Pain can be managed with many types of alternative treatments. Ask your health care provider to refer you to one or more specialists who can help you manage pain through: Physical or occupational therapy. Counseling (cognitive behavioral therapy). Good nutrition. Biofeedback. Massage. Meditation. Non-opioid medicine. Following a gentle exercise program. How to use opioid pain medicine Taking medicine Take your pain medicine exactly as told by your health care provider. Take it only when you need it. If your pain gets less severe, you may take less than your prescribed dose if your health care provider approves. If you are not having pain, do nottake pain medicine unless your health care provider tells you to take it. If your pain is severe, do nottry to treat it yourself by taking more pills than instructed on your prescription. Contact your health care provider for help. Write down the times when you take your pain medicine. It is easy to become confused while on pain medicine. Writing the time can help you avoid overdose. Take other over-the-counter or prescription medicines only as told by your health care provider. Keeping yourself and others safe  While you are taking opioid pain medicine: Do not drive, use machinery, or power tools. Do not sign legal documents. Do not drink alcohol. Do not take sleeping pills. Do not supervise children by yourself. Do not do activities that require climbing or being in high places. Do not go to a lake, river, ocean, spa,  or swimming pool. Do not share your pain medicine with anyone. Keep pain medicine in a locked cabinet or in a secure area where pets and children cannot reach it. Stopping your use of opioids If you have been taking opioid  medicine for more than a few weeks, you may need to slowly decrease (taper) how much you take until you stop completely. Tapering your use of opioids can decrease your risk of symptoms of withdrawal, such as: Pain and cramping in the abdomen. Nausea. Sweating. Sleepiness. Restlessness. Uncontrollable shaking (tremors). Cravings for the medicine. Do not attempt to taper your use of opioids on your own. Talk with your health care provider about how to do this. Your health care provider may prescribe a step-down schedule based on how much medicine you are taking and how long you have been taking it. Getting rid of leftover pills Do not save any leftover pills. Get rid of leftover pills safely by: Taking the medicine to a prescription take-back program. This is usually offered by the county or law enforcement. Bringing them to a pharmacy that has a drug disposal container. Flushing them down the toilet. Check the label or package insert of your medicine to see whether this is safe to do. Throwing them out in the trash. Check the label or package insert of your medicine to see whether this is safe to do. If it is safe to throw it out, remove the medicine from the original container, put it into a sealable bag or container, and mix it with used coffee grounds, food scraps, dirt, or cat litter before putting it in the trash. Follow these instructions at home: Activity Do exercises as told by your health care provider. Avoid activities that make your pain worse. Return to your normal activities as told by your health care provider. Ask your health care provider what activities are safe for you. General instructions You may need to take these actions to prevent or treat constipation: Drink enough fluid to keep your urine pale yellow. Take over-the-counter or prescription medicines. Eat foods that are high in fiber, such as beans, whole grains, and fresh fruits and vegetables. Limit foods that are  high in fat and processed sugars, such as fried or sweet foods. Keep all follow-up visits. This is important. Where to find support If you have been taking opioids for a long time, you may benefit from receiving support for quitting from a local support group or counselor. Ask your health care provider for a referral to these resources in your area. Where to find more information Centers for Disease Control and Prevention (CDC): FootballExhibition.com.br [place] Food and Drug Administration (FDA): PumpkinSearch.com.ee Get help right away if: You may have taken too much of an opioid (overdosed). Common symptoms of an overdose: Your breathing is slower or more shallow than normal. You have a very slow heartbeat (pulse). You have slurred speech. You have nausea and vomiting. Your pupils become very small. You have other potential symptoms: You are very confused. You faint or feel like you will faint. You have cold, clammy skin. You have blue lips or fingernails. You have thoughts of harming yourself or harming others. These symptoms may represent a serious problem that is an emergency. Do not wait to see if the symptoms will go away. Get medical help right away. Call your local emergency services (911 in the [place] Do not drive yourself to the hospital.  If you ever feel like you may hurt yourself or others, or have thoughts  about taking your own life, get help right away. Go to your nearest emergency department or: Call your local emergency services (911 in the [place] Call the [facility] ((248)800-4348 in the [place] Call a suicide crisis helpline, such as the National Suicide Prevention Lifeline at 239-450-7822 or 988 in the [place] This is open 24 hours a day in the [place] Text the Crisis Text Line at 867 618 9545 (in the [place] Summary Opioid medicines can help you manage moderate to severe pain for a short period of time. A pain treatment plan is an agreement between you and your health care provider.  Discuss the goals of your treatment, including how much pain you might expect to have and how you will manage the pain. If you think that you or someone else may have taken too much of an opioid, get medical help right away. This information is not intended to replace advice given to you by your health care provider. Make sure you discuss any questions you have with your health care provider. Document Revised: 11/27/2020 Document Reviewed: 08/14/2020 Elsevier Patient Education  2024 Elsevier Inc.  This is a list of the screening recommended for you and due dates:  Health Maintenance  Topic Date Due   Hepatitis C Screening  Never done   Zoster (Shingles) Vaccine (2 of 2) 11/05/2022   Medicare Annual Wellness Visit  02/11/2023   COVID-19 Vaccine (4 - 2023-24 season) 02/28/2023*   Hemoglobin A1C  06/03/2023   Eye exam for diabetics  06/18/2023   Complete foot exam   06/24/2023   Yearly kidney function blood test for diabetes  02/12/2024   Yearly kidney health urinalysis for diabetes  02/12/2024   DTaP/Tdap/Td vaccine (2 - Td or Tdap) 09/09/2032   Pneumonia Vaccine  Completed   Flu Shot  Completed   DEXA scan (bone density measurement)  Completed   HPV Vaccine  Aged Out   Colon Cancer Screening  Discontinued  *Topic was postponed. The date shown is not the original due date.    Advanced directives: (Copy Requested) Please bring a copy of your health care power of attorney and living will to the office to be added to your chart at your convenience.  Next Medicare Annual Wellness Visit scheduled for next year: Yes

## 2023-02-19 NOTE — Progress Notes (Signed)
Subjective:   Angela Pugh is a 77 y.o. female who presents for Medicare Annual (Subsequent) preventive examination.  Visit Complete: In person    Cardiac Risk Factors include: advanced age (>52men, >53 women);diabetes mellitus;hypertension     Objective:    Today's Vitals   02/19/23 1350  BP: 122/62  Pulse: 68  Temp: 98.4 F (36.9 C)  TempSrc: Oral  SpO2: 98%  Weight: 169 lb 9.6 oz (76.9 kg)  Height: 5\' 8"  (1.727 m)   Body mass index is 25.79 kg/m.     02/19/2023    2:01 PM 12/08/2022    6:00 PM 12/08/2022    6:10 AM 12/01/2022    9:16 AM 10/13/2022    8:50 AM 06/29/2022    9:51 PM 02/10/2022    8:20 AM  Advanced Directives  Does Patient Have a Medical Advance Directive? Yes Yes Yes Yes Yes No Yes  Type of Estate agent of [place];Living will Healthcare Power of [employer] of [place];Living will Healthcare Power of [place];Living will Healthcare Power of [place];Living will  Healthcare Power of [place];Living will  Does patient want to make changes to medical advance directive?  No - Patient declined   No - Patient declined    Copy of Healthcare Power of Attorney in Chart? No - copy requested No - copy requested No - copy requested No - copy requested No - copy requested  No - copy requested  Would patient like information on creating a medical advance directive?      No - Patient declined     Current Medications (verified) Outpatient Encounter Medications as of 02/19/2023  Medication Sig   ABRYSVO 120 MCG/0.5ML injection    amLODipine (NORVASC) 5 MG tablet TAKE 1 TABLET(5 MG) BY MOUTH DAILY   aspirin 81 MG EC tablet Take 81 mg by mouth daily. Swallow whole.   Aspirin-Acetaminophen-Caffeine (GOODY HEADACHE PO) Take 1 packet by mouth daily as needed (headaches).   Blood Glucose Monitoring Suppl DEVI 1 each by Does not apply route in the morning, at noon, and at bedtime. May substitute to any manufacturer covered by patient's  insurance.   Calcium Carb-Cholecalciferol (CALCIUM 500 + D PO) Take 1 tablet by mouth daily.   clobetasol ointment (TEMOVATE) 0.05 % Apply 1 Application topically 2 (two) times daily. (Patient taking differently: Apply 1 Application topically 2 (two) times daily as needed (rash).)   ferrous sulfate 325 (65 FE) MG EC tablet Take 325 mg by mouth daily.   Glucose Blood (BLOOD GLUCOSE TEST STRIPS) STRP 1 each by In Vitro route in the morning, at noon, and at bedtime. May substitute to any manufacturer covered by patient's insurance.   Lancets Misc. MISC 1 each by Does not apply route in the morning, at noon, and at bedtime. May substitute to any manufacturer covered by patient's insurance.   loratadine (CLARITIN) 10 MG tablet Take 1 tablet (10 mg total) by mouth daily. (Patient taking differently: Take 10 mg by mouth daily as needed for allergies.)   mesalamine (LIALDA) 1.2 g EC tablet Take 4 tablets (4.8 g total) by mouth daily with breakfast. NEEDS OFFICE VISIT FOR FURTHER REFILLS   Multiple Vitamins-Minerals (CENTRUM ADULTS PO) Take 1 tablet by mouth daily.    NON FORMULARY Diltiazem 2%/Lidocaine5% compound Use 3 x rectally daily for 2 months to heal anal fissure   olmesartan (BENICAR) 5 MG tablet TAKE 2 TABLETS BY MOUTH DAILY   Omega-3 Fatty Acids (FISH OIL PO) Take 700 mg  by mouth daily.   pantoprazole (PROTONIX) 40 MG tablet Take 1 tablet (40 mg total) by mouth 2 (two) times daily before a meal.   potassium chloride SA (KLOR-CON M) 20 MEQ tablet TAKE 1 TABLET(20 MEQ) BY MOUTH DAILY   PREDNISOLON-MOXIFLOX-BROMFENAC OP Place 1 drop into both eyes daily as needed (irritation).   PREVNAR 20 0.5 ML injection    rosuvastatin (CRESTOR) 20 MG tablet TAKE 1 TABLET(20 MG) BY MOUTH DAILY   sucralfate (CARAFATE) 1 g tablet TAKE 1 TABLET(1 GRAM) BY MOUTH FOUR TIMES DAILY WITH MEALS AND AT BEDTIME   traMADol (ULTRAM) 50 MG tablet Take 50 mg by mouth every 6 (six) hours as needed.   triamcinolone cream  (KENALOG) 0.5 % Apply topically 2 (two) times daily.   vitamin B-12 (CYANOCOBALAMIN) 1000 MCG tablet Take 1,000 mcg by mouth daily.   No facility-administered encounter medications on file as of 02/19/2023.    Allergies (verified) Symbicort [budesonide-formoterol fumarate], Acetaminophen, Azithromycin, Lipitor [atorvastatin], Pregabalin, and Zyrtec [cetirizine]   History: Past Medical History:  Diagnosis Date   Arthritis    back, knees, shoulder, hands , injections in pelvis, for bone spurs (05/04/2016)   Chronic back pain    Diverticulosis    Dysrhythmia    LBBB   GERD (gastroesophageal reflux disease)    History of kidney stones    HLD (hyperlipidemia)    Hypertension    Migraine    hx (05/04/2016)   Peripheral neuropathy    Radiculopathy    Sinus complaint    Sinus headache    occasional (05/04/2016)   Type II diabetes mellitus (HCC)    diet controlled, no meds now, was on insulin & po treatment at one time     Vertigo    Past Surgical History:  Procedure Laterality Date   ANTERIOR CERVICAL DECOMP/DISCECTOMY FUSION N/A 04/17/2016   Procedure: ANTERIOR CERVICAL DECOMPRESSION/DISCECTOMY FUSION CERVICAL THREE - CERVICAL FOUR, POSTERIOR CERVICAL DISCECTOMY CERVICAL SEVEN -THORASIC ONE LEFT;  Surgeon: Coletta Memos, MD;  Location: [facility] OR;  Service: Neurosurgery;  Laterality: N/A;  Anterior/Posterior   ANTERIOR FUSION CERVICAL SPINE  2000   Dr. Channing Mutters; "for bone spurs; put hardware in too"   COLONOSCOPY  06/10/2016   [facility]   COLONOSCOPY  2023   LASIK Bilateral    LEFT HEART CATH AND CORONARY ANGIOGRAPHY N/A 03/17/2018   Procedure: LEFT HEART CATH AND CORONARY ANGIOGRAPHY;  Surgeon: Runell Gess, MD;  Location: [facility] INVASIVE [facility];  Service: Cardiovascular;  Laterality: N/A;   LUMBAR LAMINECTOMY Left 06/30/2013   Procedure: Left L3-4 Extraforaminal approach to excise far lateral herniated nucleus pulposus;  Surgeon: Kerrin Champagne, MD;  Location: [facility] OR;  Service: Orthopedics;   Laterality: Left;   TUBAL LIGATION     VAGINAL HYSTERECTOMY     Family History  Problem Relation Age of Onset   Uterine cancer Mother    Diabetes type II Sister    Diabetes Mellitus II Brother    Diverticulitis Daughter    Heart Problems Daughter    SIDS Son    Colon cancer Neg Hx    Pancreatic cancer Neg Hx    Esophageal cancer Neg Hx    Colon polyps Neg Hx    Rectal cancer Neg Hx    Stomach cancer Neg Hx    Social History   Socioeconomic History   Marital status: Widowed    Spouse name: Not on file   Number of children: 6   Years of education: Not on file  Highest education level: Not on file  Occupational History   Occupation: retired  Tobacco Use   Smoking status: Some Days    Current packs/day: 0.00    Average packs/day: 0.5 packs/day for 20.0 years (10.0 ttl pk-yrs)    Types: Cigarettes    Start date: 06/30/1962    Last attempt to quit: 06/30/1982    Years since quitting: 40.6   Smokeless tobacco: Never   Tobacco comments:    Patient only smokes in social setting when drinking. Admits former h/o heavy smoking years ago smoked 1.5 packs/day.    12/04/2021 Patient states she has not spoke for about 3 weeks    12/01/2022: pt will have a few drags if she is having a drink  Vaping Use   Vaping status: Never Used  Substance and Sexual Activity   Alcohol use: Yes    Comment: special occasion   Drug use: No   Sexual activity: Not Currently  Other Topics Concern   Not on file  Social History Narrative   Not on file   Social Determinants of Health   Financial Resource Strain: Low Risk  (02/19/2023)   Overall Financial Resource Strain (CARDIA)    Difficulty of Paying Living Expenses: Not hard at all  Food Insecurity: No Food Insecurity (02/19/2023)   Hunger Vital Sign    Worried About Running Out of Food in the Last Year: Never true    Ran Out of Food in the Last Year: Never true  Transportation Needs: No Transportation Needs (02/19/2023)   PRAPARE -  Administrator, Civil Service (Medical): No    Lack of Transportation (Non-Medical): No  Physical Activity: Sufficiently Active (02/19/2023)   Exercise Vital Sign    Days of Exercise per Week: 7 days    Minutes of Exercise per Session: 60 min  Stress: No Stress Concern Present (02/19/2023)   [employer] of Occupational Health - Occupational Stress Questionnaire    Feeling of Stress : Not at all  Social Connections: Moderately Integrated (02/19/2023)   Social Connection and Isolation Panel [NHANES]    Frequency of Communication with Friends and Family: More than three times a week    Frequency of Social Gatherings with Friends and Family: More than three times a week    Attends Religious Services: More than 4 times per year    Active Member of [employer] or Organizations: Yes    Attends Banker Meetings: More than 4 times per year    Marital Status: Widowed    Tobacco Counseling Ready to quit: No Counseling given: Yes Tobacco comments: Patient only smokes in social setting when drinking. Admits former h/o heavy smoking years ago smoked 1.5 packs/day. 12/04/2021 Patient states she has not spoke for about 3 weeks 12/01/2022: pt will have a few drags if she is having a drink   Clinical Intake:  Pre-visit preparation completed: Yes  Pain : No/denies pain     BMI - recorded: 25.79 Nutritional Status: BMI 25 -29 Overweight Nutritional Risks: None Diabetes: Yes CBG done?: No Did pt. bring in CBG monitor from home?: No  How often do you need to have someone help you when you read instructions, pamphlets, or other written materials from your doctor or pharmacy?: 1 - Never  Interpreter Needed?: No  Information entered by :: Theresa Mulligan LPN   Activities of Daily Living    02/19/2023    2:00 PM 12/08/2022    6:00 PM  In your present state of  health, do you have any difficulty performing the following activities:  Hearing? 0 0  Vision? 0 0  Difficulty  concentrating or making decisions? 0 0  Walking or climbing stairs? 0 1  Dressing or bathing? 0 0  Doing errands, shopping? 0 0  Preparing Food and eating ? N   Using the Toilet? N   In the past six months, have you accidently leaked urine? N   Do you have problems with loss of bowel control? N   Managing your Medications? N   Managing your Finances? N   Housekeeping or managing your Housekeeping? N     Patient Care Team: [name], MD as PCP - General (Family Medicine) Bensimhon, Bevelyn Buckles, MD as PCP - Advanced Heart Failure (Cardiology) Runell Gess, MD as PCP - Cardiology (Cardiology) Leitha Bleak Milas Kocher, [facility] (Pharmacist)  Indicate any recent Medical Services you may have received from other than [facility] providers in the past year (date may be approximate).     Assessment:   This is a routine wellness examination for Aiya.  Hearing/Vision screen Hearing Screening - Comments:: Denies hearing difficulties   Vision Screening - Comments:: Wears rx glasses - up to date with routine eye exams with  Dr Benjamine Mola   Goals Addressed               This Visit's Progress     Stay Healthy (pt-stated)         Depression Screen    02/19/2023    1:59 PM 02/12/2023    8:46 AM 02/10/2022    8:21 AM 12/26/2021    8:15 AM 12/11/2021    8:08 AM 11/10/2021    4:36 PM 01/28/2021   11:14 AM  PHQ 2/9 Scores  PHQ - 2 Score 0 0 0 0 2 3 0  PHQ- 9 Score 0 2  7 9 11      Fall Risk    02/19/2023    2:01 PM 02/12/2023    8:46 AM 02/10/2022    8:21 AM 12/26/2021    8:14 AM 12/11/2021    8:09 AM  Fall Risk   Falls in the past year? 0 0 0 0 0  Number falls in past yr: 0 0 0 0 0  Injury with Fall? 0 0 0 0 0  Risk for fall due to : No Fall Risks No Fall Risks Medication side effect No Fall Risks No Fall Risks  Follow up Falls prevention discussed Falls evaluation completed Falls prevention discussed;Education provided;Falls evaluation completed Falls evaluation completed Falls evaluation  completed    MEDICARE RISK AT HOME: Medicare Risk at Home Any stairs in or around the home?: No If so, are there any without handrails?: No Home free of loose throw rugs in walkways, pet beds, electrical cords, etc?: Yes Adequate lighting in your home to reduce risk of falls?: Yes Life alert?: No Use of a cane, walker or w/c?: No Grab bars in the bathroom?: Yes Shower chair or bench in shower?: Yes Elevated toilet seat or a handicapped toilet?: Yes  TIMED UP AND GO:  Was the test performed?  Yes  Length of time to ambulate 10 feet: 10 sec Gait steady and fast without use of assistive device    Cognitive Function:        02/19/2023    2:01 PM 02/10/2022    8:23 AM 01/28/2021   11:20 AM 02/21/2020    8:33 AM  6CIT Screen  What Year? 0 points 0  points 0 points 0 points  What month? 0 points 0 points 0 points 0 points  What time? 0 points 0 points 0 points 0 points  Count back from 20 0 points 0 points 0 points 0 points  Months in reverse 0 points 0 points 0 points 0 points  Repeat phrase 0 points 0 points 2 points 0 points  Total Score 0 points 0 points 2 points 0 points    Immunizations Immunization History  Administered Date(s) Administered   Fluad Quad(high Dose 65+) 02/10/2022   Fluad Trivalent(High Dose 65+) 02/12/2023   PFIZER(Purple Top)SARS-COV-2 Vaccination 06/24/2019, 07/15/2019, 02/27/2020   PNEUMOCOCCAL CONJUGATE-20 09/10/2022   Rsv, Bivalent, Protein Subunit Rsvpref,pf Verdis Frederickson) 09/10/2022   Tdap 09/10/2022   Zoster Recombinant(Shingrix) 09/10/2022    TDAP status: Up to date  Flu Vaccine status: Up to date  Pneumococcal vaccine status: Up to date  Covid-19 vaccine status: Declined, Education has been provided regarding the importance of this vaccine but patient still declined. Advised may receive this vaccine at local pharmacy or Health Dept.or vaccine clinic. Aware to provide a copy of the vaccination record if obtained from local pharmacy or Health  Dept. Verbalized acceptance and understanding.  Qualifies for Shingles Vaccine? Yes   Zostavax completed No   Shingrix Completed?: No.    Education has been provided regarding the importance of this vaccine. Patient has been advised to call insurance company to determine out of pocket expense if they have not yet received this vaccine. Advised may also receive vaccine at local pharmacy or Health Dept. Verbalized acceptance and understanding.  Screening Tests Health Maintenance  Topic Date Due   Hepatitis C Screening  Never done   Zoster Vaccines- Shingrix (2 of 2) 11/05/2022   COVID-19 Vaccine (4 - 2023-24 season) 02/28/2023 (Originally 01/17/2023)   HEMOGLOBIN A1C  06/03/2023   OPHTHALMOLOGY EXAM  06/18/2023   FOOT EXAM  06/24/2023   Diabetic kidney evaluation - eGFR measurement  02/12/2024   Diabetic kidney evaluation - Urine ACR  02/12/2024   Medicare Annual Wellness (AWV)  02/19/2024   DTaP/Tdap/Td (2 - Td or Tdap) 09/09/2032   Pneumonia Vaccine 17+ Years old  Completed   INFLUENZA VACCINE  Completed   DEXA SCAN  Completed   HPV VACCINES  Aged Out   Colonoscopy  Discontinued    Health Maintenance  Health Maintenance Due  Topic Date Due   Hepatitis C Screening  Never done   Zoster Vaccines- Shingrix (2 of 2) 11/05/2022        Bone Density status: Ordered 02/12/23. Pt provided with contact info and advised to call to schedule appt.    Additional Screening:  Hepatitis C Screening: does qualify; Completed Deferred  Vision Screening: Recommended annual ophthalmology exams for early detection of glaucoma and other disorders of the eye. Is the patient up to date with their annual eye exam?  Yes  Who is the provider or what is the name of the office in which the patient attends annual eye exams? Dr Benjamine Mola If pt is not established with a provider, would they like to be referred to a provider to establish care? No .   Dental Screening: Recommended annual dental exams for  proper oral hygiene  Diabetic Foot Exam: Diabetic Foot Exam: Completed 06/23/22  Community Resource Referral / Chronic Care Management:  CRR required this visit?  No   CCM required this visit?  No     Plan:     I have personally reviewed and noted the  following in the patient's chart:   Medical and social history Use of alcohol, tobacco or illicit drugs  Current medications and supplements including opioid prescriptions. Patient is currently taking opioid prescriptions. Information provided to patient regarding non-opioid alternatives. Patient advised to discuss non-opioid treatment plan with their provider. Functional ability and status Nutritional status Physical activity Advanced directives List of other physicians Hospitalizations, surgeries, and ER visits in previous 12 months Vitals Screenings to include cognitive, depression, and falls Referrals and appointments  In addition, I have reviewed and discussed with patient certain preventive protocols, quality metrics, and best practice recommendations. A written personalized care plan for preventive services as well as general preventive health recommendations were provided to patient.     Tillie Rung, LPN   16/05/958   After Visit Summary: (MyChart) Due to this being a telephonic visit, the after visit summary with patients personalized plan was offered to patient via MyChart   Nurse Notes: None

## 2023-03-03 ENCOUNTER — Ambulatory Visit: Payer: Medicare Other | Admitting: Orthopedic Surgery

## 2023-03-03 ENCOUNTER — Other Ambulatory Visit (INDEPENDENT_AMBULATORY_CARE_PROVIDER_SITE_OTHER): Payer: Medicare Other

## 2023-03-03 DIAGNOSIS — Z981 Arthrodesis status: Secondary | ICD-10-CM

## 2023-03-03 DIAGNOSIS — M170 Bilateral primary osteoarthritis of knee: Secondary | ICD-10-CM

## 2023-03-03 MED ORDER — CODEINE SULFATE 15 MG PO TABS: 15.0000 mg | ORAL_TABLET | Freq: Four times a day (QID) | ORAL | 0 refills | Status: AC | PRN

## 2023-03-03 MED ORDER — DICLOFENAC SODIUM 1 % EX GEL
2.0000 g | CUTANEOUS | Status: AC | PRN
Start: 2023-03-03 — End: ?

## 2023-03-03 MED ORDER — METHYLPREDNISOLONE 4 MG PO TBPK: ORAL_TABLET | ORAL | 0 refills | Status: DC

## 2023-03-03 NOTE — Progress Notes (Signed)
Orthopedic Surgery Post-operative Office Visit   Procedure: L3-5 laminectomy and PSIF Date of Surgery: 12/08/2022 (~3 months post-op)   Assessment: Patient is a 77 y.o. who has noted improvement in her preoperative radiating leg pain but still has low back pain.  She also comes in today for bilateral knee OA and pain     Plan: -Operative plans complete -No spine specific precautions at this time -For her acute worsening of bilateral knee pain, prescribed a Medrol Dosepak and codeine.  Told her to use the codeine sparingly and I would not prescribe this regularly.  I told her that these medications would also likely help with her back pain -After discussing injections to her knees in the office today, patient was interested so those were done (see procedure note below) -Return to office in 3 months, x-rays needed at next visit: AP/lateral/flex/ex lumbar   Bilateral knee injection note: After discussing the risk, benefits, alternatives of bilateral intra-articular knee injections, patient elected to proceed.  The patient was in the seated position with the knees at 90 degrees.  The anterior lateral soft spot on the right knee was prepped with an alcohol based prep.  The skin was anesthetized with ethyl chloride.  A 20-gauge needle was used to inject 1 cc of bupivacaine, 1 cc of lidocaine, 1 cc of Depo-Medrol under standard sterile technique.  The needle was withdrawn and a Band-Aid was applied.  The same procedure was then repeated on the left side.  Patient tolerated these procedures well.  She felt that her knee pain was better when ambulating out of the exam room. ___________________________________________________________________________     Subjective: Patient was doing well after surgery, then within the last 2 weeks or so, she has noted acute worsening of low back pain and bilateral knee pain.  She states she is still not having the radiating pain into her leg which has improved since  surgery.  However, her back pain and particularly her bilateral knee pain are severe and have limited her activities.  She has previously seen Dr. Otelia Sergeant for bilateral knee osteoarthritis.  She says this feels similar to the arthritic flare she has had in the past.  She feels the pain with weightbearing and improves with sitting.  She feels the pain on the medial and lateral aspect of the knees but mostly on the medial aspect.  There is no trauma or injury that preceded the onset of this acute worsening of her chronic bilateral knee pain.  She has had no bowel or bladder incontinence.  No saddle anesthesia.     Objective:   General: no acute distress, appropriate affect Neurologic: alert, answering questions appropriately, following commands Respiratory: unlabored breathing on room air Skin: incision is well healed   MSK (spine):   -Strength exam                                                   Left                  Right   EHL                              5/5                  5/5 TA  5/5                  5/5 GSC                             5/5                  5/5 Knee extension            5/5                  5/5 Hip flexion                    5/5                  5/5   -Sensory exam                           Sensation intact to light touch in L3-S1 nerve distributions of bilateral lower extremities  -Bilateral knee exam: TTP over the medial and lateral joint lines, pain with McMurray but no palpable click, no effusion present, knee stable to varus and valgus stress, negative Lachman, negative posterior drawer   Imaging: X-rays of the lumbar spine taken 03/03/2023 were independently reviewed and interpreted, showing posterior instrumentation from L3-L5.  The screws appear in appropriate position.  No the screws have backed out.  No lucency seen around the screws.  No evidence of instability on flexion/extension views.  Gentle gradual lumbar scoliotic  curvature seen with apex to the left.  Laminectomy defect from L3-L5.     Patient name: Angela Pugh Patient MRN: [id number] Date of visit: 03/03/23

## 2023-03-11 NOTE — Progress Notes (Signed)
Agree with the assessment and plan as outlined by Jessica Zehr, PA-C. ? ?Omesha Bowerman, DO, FACG ? ?

## 2023-03-26 ENCOUNTER — Telehealth: Payer: Self-pay | Admitting: Orthopedic Surgery

## 2023-03-26 MED ORDER — CODEINE SULFATE 15 MG PO TABS: 15.0000 mg | ORAL_TABLET | Freq: Four times a day (QID) | ORAL | 0 refills | Status: AC | PRN

## 2023-03-26 NOTE — Telephone Encounter (Signed)
Patient called needing Rx refilled Codeine Sulfate 15 mg. The number to contact patient is [phone]

## 2023-03-28 ENCOUNTER — Other Ambulatory Visit: Payer: Self-pay | Admitting: Cardiovascular Disease

## 2023-04-01 ENCOUNTER — Other Ambulatory Visit: Payer: Self-pay

## 2023-04-01 ENCOUNTER — Encounter: Payer: Self-pay | Admitting: Family Medicine

## 2023-04-01 ENCOUNTER — Ambulatory Visit (INDEPENDENT_AMBULATORY_CARE_PROVIDER_SITE_OTHER): Payer: Medicare Other | Admitting: Family Medicine

## 2023-04-01 ENCOUNTER — Emergency Department (HOSPITAL_BASED_OUTPATIENT_CLINIC_OR_DEPARTMENT_OTHER): Payer: Medicare Other

## 2023-04-01 ENCOUNTER — Emergency Department (HOSPITAL_BASED_OUTPATIENT_CLINIC_OR_DEPARTMENT_OTHER)
Admission: EM | Admit: 2023-04-01 | Discharge: 2023-04-02 | Disposition: A | Discharge status: Home / Self Care | Payer: Medicare Other | Attending: Emergency Medicine | Admitting: Emergency Medicine

## 2023-04-01 ENCOUNTER — Encounter (HOSPITAL_BASED_OUTPATIENT_CLINIC_OR_DEPARTMENT_OTHER): Payer: Self-pay

## 2023-04-01 VITALS — BP 180/92 | HR 56 | Temp 98.8°F | Ht 68.0 in | Wt 164.8 lb

## 2023-04-01 DIAGNOSIS — H538 Other visual disturbances: Secondary | ICD-10-CM | POA: Diagnosis not present

## 2023-04-01 DIAGNOSIS — R1032 Left lower quadrant pain: Secondary | ICD-10-CM | POA: Insufficient documentation

## 2023-04-01 DIAGNOSIS — Z7982 Long term (current) use of aspirin: Secondary | ICD-10-CM | POA: Diagnosis not present

## 2023-04-01 DIAGNOSIS — K7689 Other specified diseases of liver: Secondary | ICD-10-CM | POA: Diagnosis not present

## 2023-04-01 DIAGNOSIS — R1084 Generalized abdominal pain: Secondary | ICD-10-CM | POA: Diagnosis not present

## 2023-04-01 DIAGNOSIS — E1165 Type 2 diabetes mellitus with hyperglycemia: Secondary | ICD-10-CM | POA: Insufficient documentation

## 2023-04-01 DIAGNOSIS — E1140 Type 2 diabetes mellitus with diabetic neuropathy, unspecified: Secondary | ICD-10-CM | POA: Diagnosis not present

## 2023-04-01 DIAGNOSIS — R001 Bradycardia, unspecified: Secondary | ICD-10-CM | POA: Diagnosis not present

## 2023-04-01 DIAGNOSIS — K5790 Diverticulosis of intestine, part unspecified, without perforation or abscess without bleeding: Secondary | ICD-10-CM

## 2023-04-01 DIAGNOSIS — J4489 Other specified chronic obstructive pulmonary disease: Secondary | ICD-10-CM | POA: Diagnosis not present

## 2023-04-01 DIAGNOSIS — R42 Dizziness and giddiness: Secondary | ICD-10-CM

## 2023-04-01 DIAGNOSIS — R0602 Shortness of breath: Secondary | ICD-10-CM | POA: Diagnosis not present

## 2023-04-01 DIAGNOSIS — Z79899 Other long term (current) drug therapy: Secondary | ICD-10-CM | POA: Insufficient documentation

## 2023-04-01 DIAGNOSIS — I10 Essential (primary) hypertension: Secondary | ICD-10-CM | POA: Insufficient documentation

## 2023-04-01 DIAGNOSIS — R531 Weakness: Secondary | ICD-10-CM | POA: Diagnosis not present

## 2023-04-01 DIAGNOSIS — I672 Cerebral atherosclerosis: Secondary | ICD-10-CM | POA: Diagnosis not present

## 2023-04-01 DIAGNOSIS — K5730 Diverticulosis of large intestine without perforation or abscess without bleeding: Secondary | ICD-10-CM | POA: Diagnosis not present

## 2023-04-01 DIAGNOSIS — R5383 Other fatigue: Secondary | ICD-10-CM | POA: Diagnosis not present

## 2023-04-01 LAB — COMPREHENSIVE METABOLIC PANEL
ALT: 8 U/L (ref 0–44)
AST: 11 U/L — ABNORMAL LOW (ref 15–41)
Albumin: 4 g/dL (ref 3.5–5.0)
Alkaline Phosphatase: 75 U/L (ref 38–126)
Anion gap: 9 (ref 5–15)
BUN: 8 mg/dL (ref 8–23)
CO2: 26 mmol/L (ref 22–32)
Calcium: 9.8 mg/dL (ref 8.9–10.3)
Chloride: 102 mmol/L (ref 98–111)
Creatinine, Ser: 0.86 mg/dL (ref 0.44–1.00)
GFR, Estimated: >60 mL/min (ref >60)
Glucose, Bld: 135 mg/dL — ABNORMAL HIGH (ref 70–99)
Potassium: 3.6 mmol/L (ref 3.5–5.1)
Sodium: 137 mmol/L (ref 135–145)
Total Bilirubin: 0.7 mg/dL (ref <1.2)
Total Protein: 8 g/dL (ref 6.5–8.1)

## 2023-04-01 LAB — CBC WITH DIFFERENTIAL/PLATELET
Abs Immature Granulocytes: 0.01 10*3/uL (ref 0.00–0.07)
Basophils Absolute: 0.1 10*3/uL (ref 0.0–0.1)
Basophils Relative: 1 %
Eosinophils Absolute: 0.2 10*3/uL (ref 0.0–0.5)
Eosinophils Relative: 3 %
HCT: 44 % (ref 36.0–46.0)
Hemoglobin: 15.3 g/dL — ABNORMAL HIGH (ref 12.0–15.0)
Immature Granulocytes: 0 %
Lymphocytes Relative: 46 %
Lymphs Abs: 3.5 10*3/uL (ref 0.7–4.0)
MCH: 31 pg (ref 26.0–34.0)
MCHC: 34.8 g/dL (ref 30.0–36.0)
MCV: 89.2 fL (ref 80.0–100.0)
Monocytes Absolute: 0.6 10*3/uL (ref 0.1–1.0)
Monocytes Relative: 8 %
Neutro Abs: 3.2 10*3/uL (ref 1.7–7.7)
Neutrophils Relative %: 42 %
Platelets: 234 10*3/uL (ref 150–400)
RBC: 4.93 MIL/uL (ref 3.87–5.11)
RDW: 12.8 % (ref 11.5–15.5)
WBC: 7.5 10*3/uL (ref 4.0–10.5)
nRBC: 0 % (ref 0.0–0.2)

## 2023-04-01 LAB — CBG MONITORING, ED: Glucose-Capillary: 136 mg/dL — ABNORMAL HIGH (ref 70–99)

## 2023-04-01 LAB — LIPASE, BLOOD: Lipase: 30 U/L (ref 11–51)

## 2023-04-01 MED ORDER — SODIUM CHLORIDE 0.9 % IV BOLUS
1000.0000 mL | Freq: Once | INTRAVENOUS | Status: AC
  Administered 2023-04-01: 1000 mL via INTRAVENOUS

## 2023-04-01 MED ORDER — IOHEXOL 300 MG/ML  SOLN
100.0000 mL | Freq: Once | INTRAMUSCULAR | Status: AC | PRN
  Administered 2023-04-01: 85 mL via INTRAVENOUS

## 2023-04-01 NOTE — ED Notes (Signed)
Patient transported to CT 

## 2023-04-01 NOTE — Progress Notes (Signed)
Established Patient Office Visit   Subjective  Patient ID: Angela Pugh, female    DOB: 13-Dec-1945  Age: 77 y.o. MRN: [id number]  Chief Complaint  Patient presents with   Abdominal Pain    Patient states she is unable to eat because of the pain, rate of pain 10 out of 10     Patient is a 77 year old female seen for worsening of chronic abdominal pain over the last 1.5 months.  Seen by GI for years with continued symptoms.  States unable to eat or drink due to abdominal pain/cramping.  Eating 1-2 nab crackers daily.  If tries to eat more has emesis.  Endorses small caliber palpable like stool.   Pt states symptoms are affecting her life.  Unable to take meds this am due to symptoms.  Pt contacted GI, told next available appt Dec 16 with PA and January for a physician.    Patient Active Problem List   Diagnosis Date Noted   Constipation 02/18/2023   Radiculopathy, lumbar region 12/08/2022   Spondylolisthesis, lumbar region 12/08/2022   Headache above the eye region 02/19/2021   Nasal congestion 02/19/2021   Emphysema lung (HCC) 10/04/2020   Abdominal pain, epigastric 03/18/2020   Body mass index (BMI) 26.0-26.9, adult 10/23/2019   Nonischemic cardiomyopathy (HCC) 04/12/2018   Chest pain 01/25/2018   Left bundle branch block 01/25/2018   Carotid artery disease (HCC) 01/25/2018   Seasonal allergies 10/18/2017   Arthritis 10/18/2017   Pre-diabetes 10/18/2017   Protein-calorie malnutrition, severe 05/05/2016   Dysphagia 05/04/2016   Displacement of cervical intervertebral disc without myelopathy 03/18/2016   Carpal tunnel syndrome 02/10/2016   Cervical spondylosis with radiculopathy 02/10/2016   Abdominal pain 02/01/2016   Acute diverticulitis 02/01/2016   HLD (hyperlipidemia) 02/01/2016   Diabetes mellitus without complication (HCC) 02/01/2016   Hypokalemia 02/01/2016   Hypertension    Cervical spondylosis without myelopathy 01/13/2016   Bilateral carpal tunnel syndrome  01/13/2016   Herniated lumbar intervertebral disc 06/30/2013    Class: Acute   Low back pain 02/06/2013   Past Medical History:  Diagnosis Date   Arthritis    back, knees, shoulder, hands , injections in pelvis, for bone spurs (05/04/2016)   Chronic back pain    Diverticulosis    Dysrhythmia    LBBB   GERD (gastroesophageal reflux disease)    History of kidney stones    HLD (hyperlipidemia)    Hypertension    Migraine    hx (05/04/2016)   Peripheral neuropathy    Radiculopathy    Sinus complaint    Sinus headache    occasional (05/04/2016)   Type II diabetes mellitus (HCC)    diet controlled, no meds now, was on insulin & po treatment at one time     Vertigo    Past Surgical History:  Procedure Laterality Date   ANTERIOR CERVICAL DECOMP/DISCECTOMY FUSION N/A 04/17/2016   Procedure: ANTERIOR CERVICAL DECOMPRESSION/DISCECTOMY FUSION CERVICAL THREE - CERVICAL FOUR, POSTERIOR CERVICAL DISCECTOMY CERVICAL SEVEN -THORASIC ONE LEFT;  Surgeon: Coletta Memos, MD;  Location: [facility] OR;  Service: Neurosurgery;  Laterality: N/A;  Anterior/Posterior   ANTERIOR FUSION CERVICAL SPINE  2000   Dr. Channing Mutters; "for bone spurs; put hardware in too"   COLONOSCOPY  06/10/2016   [facility]   COLONOSCOPY  2023   LASIK Bilateral    LEFT HEART CATH AND CORONARY ANGIOGRAPHY N/A 03/17/2018   Procedure: LEFT HEART CATH AND CORONARY ANGIOGRAPHY;  Surgeon: Runell Gess, MD;  Location: [facility] INVASIVE  CV LAB;  Service: Cardiovascular;  Laterality: N/A;   LUMBAR LAMINECTOMY Left 06/30/2013   Procedure: Left L3-4 Extraforaminal approach to excise far lateral herniated nucleus pulposus;  Surgeon: Kerrin Champagne, MD;  Location: [facility] OR;  Service: Orthopedics;  Laterality: Left;   TUBAL LIGATION     VAGINAL HYSTERECTOMY     Social History   Tobacco Use   Smoking status: Some Days    Current packs/day: 0.00    Average packs/day: 0.5 packs/day for 20.0 years (10.0 ttl pk-yrs)    Types: Cigarettes    Start date: 06/30/1962     Last attempt to quit: 06/30/1982    Years since quitting: 40.7   Smokeless tobacco: Never   Tobacco comments:    Patient only smokes in social setting when drinking. Admits former h/o heavy smoking years ago smoked 1.5 packs/day.    12/04/2021 Patient states she has not spoke for about 3 weeks    12/01/2022: pt will have a few drags if she is having a drink  Vaping Use   Vaping status: Never Used  Substance Use Topics   Alcohol use: Yes    Comment: special occasion   Drug use: No   Family History  Problem Relation Age of Onset   Uterine cancer Mother    Diabetes type II Sister    Diabetes Mellitus II Brother    Diverticulitis Daughter    Heart Problems Daughter    SIDS Son    Colon cancer Neg Hx    Pancreatic cancer Neg Hx    Esophageal cancer Neg Hx    Colon polyps Neg Hx    Rectal cancer Neg Hx    Stomach cancer Neg Hx    Allergies  Allergen Reactions   Symbicort [Budesonide-Formoterol Fumarate] Shortness Of Breath, Swelling and Other (See Comments)    Pharyngeal edema, hoarseness, tightness in chest, lips became dark.   Acetaminophen Hives and Nausea Only   Azithromycin Hives and Nausea And Vomiting   Lipitor [Atorvastatin] Nausea Only    weakness   Pregabalin Nausea And Vomiting    dizziness   Zyrtec [Cetirizine] Hives      ROS Negative unless stated above    Objective:     BP (!) 180/92 (BP Location: Right Arm, Patient Position: Sitting, Cuff Size: Normal)   Pulse (!) 56   Temp 98.8 F (37.1 C) (Oral)   Ht 5\' 8"  (1.727 m)   Wt 164 lb 12.8 oz (74.8 kg)   SpO2 98%   BMI 25.06 kg/m  BP Readings from Last 3 Encounters:  04/01/23 (!) 180/92  02/19/23 122/62  02/18/23 136/80   Wt Readings from Last 3 Encounters:  04/01/23 164 lb 12.8 oz (74.8 kg)  02/19/23 169 lb 9.6 oz (76.9 kg)  02/18/23 171 lb (77.6 kg)      Physical Exam Constitutional:      Appearance: Normal appearance. She is ill-appearing.     Comments: Tearful. Hunched over in chair  laying against exam table.  HENT:     Head: Normocephalic and atraumatic.  Cardiovascular:     Rate and Rhythm: Normal rate.  Pulmonary:     Effort: Pulmonary effort is normal.  Abdominal:     General: Bowel sounds are normal.     Palpations: Abdomen is soft.     Tenderness: There is generalized abdominal tenderness.  Neurological:     Mental Status: She is alert.  Psychiatric:        Behavior: Behavior is cooperative.  No results found for any visits on 04/01/23.    Assessment & Plan:  Generalized abdominal pain  Essential hypertension  Per chart review patient with weight loss, inability to eat, severe abdominal pain worse in the last month and a half.  Severe sigmoid diverticulosis CT angio chest/abdomen/pelvis 06/29/2022.  Given acuity of patient's symptoms, advised to proceed to nearest ED for further evaluation.  Patient in agreement with plan.  Return if symptoms worsen or fail to improve.   [name], MD

## 2023-04-01 NOTE — ED Provider Notes (Signed)
[place] EMERGENCY DEPARTMENT AT [facility] Provider Note   CSN: [id number] Arrival date & time: 04/01/23  2223     History  Chief Complaint  Patient presents with   Weakness    Angela Pugh is a 77 y.o. female patient with past medical history of hypertension, vertigo, diverticulosis, GERD, peripheral neuropathy, hyperlipidemia, diabetes presenting to emergency room with generalized weakness and dizziness that has been ongoing for 2 days.  Patient reports she has had decreased appetite, left lower quadrant pain and generalized weakness ongoing for almost a month but it is getting worse.  Patient endorses shortness of breath with exertion, denies shortness of breath lying down flat.  No bilateral lower extremity edema noted.  Patient thinks all this is ongoing because her diverticulitis is flaring up.  Reports she has had 1 episode of diarrhea yesterday with no blood in stool.  Patient does not endorse any focal neurological deficits, no chest pain, no confusion, no dysuria or suprapubic pain.  Denies blood in urine.   Weakness Associated symptoms: abdominal pain, dizziness and shortness of breath        Home Medications Prior to Admission medications   Medication Sig Start Date End Date Taking? Authorizing Provider  ABRYSVO 120 MCG/0.5ML injection  09/09/22   [provider]  amLODipine (NORVASC) 5 MG tablet TAKE 1 TABLET(5 MG) BY MOUTH DAILY 03/30/23   Runell Gess, MD  aspirin 81 MG EC tablet Take 81 mg by mouth daily. Swallow whole.    [provider]  Aspirin-Acetaminophen-Caffeine (GOODY HEADACHE PO) Take 1 packet by mouth daily as needed (headaches).    [provider]  Blood Glucose Monitoring Suppl DEVI 1 each by Does not apply route in the morning, at noon, and at bedtime. May substitute to any manufacturer covered by patient's insurance. 09/02/22   [name], MD  Calcium Carb-Cholecalciferol (CALCIUM 500 + D PO) Take 1  tablet by mouth daily.    [provider]  clobetasol ointment (TEMOVATE) 0.05 % Apply 1 Application topically 2 (two) times daily. Patient taking differently: Apply 1 Application topically 2 (two) times daily as needed (rash). 01/08/22   [name], MD  ferrous sulfate 325 (65 FE) MG EC tablet Take 325 mg by mouth daily.    [provider]  Glucose Blood (BLOOD GLUCOSE TEST STRIPS) STRP 1 each by In Vitro route in the morning, at noon, and at bedtime. May substitute to any manufacturer covered by patient's insurance. 09/02/22   [name], MD  Lancets Misc. MISC 1 each by Does not apply route in the morning, at noon, and at bedtime. May substitute to any manufacturer covered by patient's insurance. 09/02/22   [name], MD  loratadine (CLARITIN) 10 MG tablet Take 1 tablet (10 mg total) by mouth daily. Patient taking differently: Take 10 mg by mouth daily as needed for allergies. 09/02/22   [name], MD  mesalamine (LIALDA) 1.2 g EC tablet Take 4 tablets (4.8 g total) by mouth daily with breakfast. NEEDS OFFICE VISIT FOR FURTHER REFILLS 02/03/23   Doree Albee, PA-C  methylPREDNISolone (MEDROL DOSEPAK) 4 MG TBPK tablet Take as prescribed on the box 03/03/23   London Sheer, MD  Multiple Vitamins-Minerals (CENTRUM ADULTS PO) Take 1 tablet by mouth daily.     [provider]  NON FORMULARY Diltiazem 2%/Lidocaine5% compound Use 3 x rectally daily for 2 months to heal anal fissure 07/30/22   Doree Albee,  PA-C  olmesartan (BENICAR) 5 MG tablet TAKE 2 TABLETS BY MOUTH DAILY 01/14/23   [name], MD  Omega-3 Fatty Acids (FISH OIL PO) Take 700 mg by mouth daily.    [provider]  pantoprazole (PROTONIX) 40 MG tablet Take 1 tablet (40 mg total) by mouth 2 (two) times daily before a meal. 07/06/22   Doree Albee, PA-C  potassium chloride SA (KLOR-CON M) 20 MEQ tablet TAKE 1 TABLET(20 MEQ) BY MOUTH DAILY Patient taking  differently: 40 mEq. 12/14/22   [name], MD  PREDNISOLON-MOXIFLOX-BROMFENAC OP Place 1 drop into both eyes daily as needed (irritation).    [provider]  PREVNAR 20 0.5 ML injection  09/09/22   [provider]  rosuvastatin (CRESTOR) 20 MG tablet TAKE 1 TABLET(20 MG) BY MOUTH DAILY 09/08/22   Runell Gess, MD  sucralfate (CARAFATE) 1 g tablet TAKE 1 TABLET(1 GRAM) BY MOUTH FOUR TIMES DAILY WITH MEALS AND AT BEDTIME 01/01/23   Doree Albee, PA-C  triamcinolone cream (KENALOG) 0.5 % Apply topically 2 (two) times daily. 02/12/23   [name], MD  vitamin B-12 (CYANOCOBALAMIN) 1000 MCG tablet Take 1,000 mcg by mouth daily.    [provider]      Allergies    Symbicort [budesonide-formoterol fumarate], Acetaminophen, Azithromycin, Lipitor [atorvastatin], Pregabalin, and Zyrtec [cetirizine]    Review of Systems   Review of Systems  Constitutional:  Positive for activity change and appetite change.  Respiratory:  Positive for shortness of breath.   Gastrointestinal:  Positive for abdominal pain.  Neurological:  Positive for dizziness and weakness.    Physical Exam Updated Vital Signs BP (!) 169/89 (BP Location: Left Arm)   Pulse (!) 56   Temp 98.3 F (36.8 C)   Resp 18   Ht 5\' 8"  (1.727 m)   Wt 74.8 kg   SpO2 97%   BMI 25.06 kg/m  Physical Exam Vitals and nursing note reviewed.  Constitutional:      General: She is not in acute distress.    Appearance: She is not toxic-appearing.  HENT:     Head: Normocephalic and atraumatic.     Nose: No congestion or rhinorrhea.     Mouth/Throat:     Pharynx: No oropharyngeal exudate or posterior oropharyngeal erythema.  Eyes:     General: No scleral icterus.    Conjunctiva/sclera: Conjunctivae normal.     Comments: Eyes equal and reactive to light.  No nystagmus.  Patient is answering questions appropriately alert and oriented with no slurred speech.  Cardiovascular:     Rate and Rhythm:  Regular rhythm. Bradycardia present.     Pulses: Normal pulses.     Heart sounds: Normal heart sounds.  Pulmonary:     Effort: Pulmonary effort is normal. No respiratory distress.     Breath sounds: Normal breath sounds. No wheezing.  Abdominal:     General: Abdomen is flat. Bowel sounds are normal. There is no distension.     Palpations: Abdomen is soft.     Tenderness: There is abdominal tenderness.     Comments: LLQ  Musculoskeletal:     Right lower leg: No edema.     Left lower leg: No edema.  Lymphadenopathy:     Cervical: No cervical adenopathy.  Skin:    General: Skin is warm and dry.     Findings: No lesion.  Neurological:     General: No focal deficit present.     Mental Status:  She is alert and oriented to person, place, and time. Mental status is at baseline.     Comments: No cranial nerve deficits noted on exam, sensation equal bilaterally.  Patient feels generalized weakness of upper and lower extremities.  No ataxia noted on exam.       ED Results / Procedures / Treatments   Labs (all labs ordered are listed, but only abnormal results are displayed) Labs Reviewed  CBC WITH DIFFERENTIAL/PLATELET - Abnormal; Notable for the following components:      Result Value   Hemoglobin 15.3 (*)    All other components within normal limits  COMPREHENSIVE METABOLIC PANEL - Abnormal; Notable for the following components:   Glucose, Bld 135 (*)    AST 11 (*)    All other components within normal limits  CBG MONITORING, ED - Abnormal; Notable for the following components:   Glucose-Capillary 136 (*)    All other components within normal limits  LIPASE, BLOOD  URINALYSIS, W/ REFLEX TO CULTURE (INFECTION SUSPECTED)  BRAIN NATRIURETIC PEPTIDE  TROPONIN I (HIGH SENSITIVITY)    EKG None  Radiology No results found.  Procedures Procedures    Medications Ordered in ED Medications  sodium chloride 0.9 % bolus 1,000 mL (1,000 mLs Intravenous New Bag/Given 04/01/23 2244)   iohexol (OMNIPAQUE) 300 MG/ML solution 100 mL (85 mLs Intravenous Contrast Given 04/01/23 2335)    ED Course/ Medical Decision Making/ A&P                                 Medical Decision Making Amount and/or Complexity of Data Reviewed Labs: ordered. Radiology: ordered.  Risk Prescription drug management.   Angela Pugh 77 y.o. presented today for weakness. Working DDx that I considered at this time includes, but not limited to, CVA/TIA, arrhythmia, vertigo, medication s/e, orthostatic hypotension, electrolyte abnormalities, dehydration, URI, ACS, UTI, anemia   R/o DDx: These are considered less likely than current impression due to history of present illness, physical exam, lab/imaging findings   Review of prior external notes: This note office visit 04/01/2023  Pmhx: hypertension, vertigo, diverticulosis, GERD, peripheral neuropathy, hyperlipidemia, diabetes  Unique Tests and My Interpretation:  CBC with differential:  CMP:  POC BG: 136 Trop:  Lipase 30 BNP:  UA:   EKG: Rate, rhythm, axis, intervals all examined: sinus bradycardia   Imaging: pending  CXR CT of head CT of abdomen and pelvis.   Problem List / ED Course / Critical interventions / Medication management  Patient reporting to emergency room with multiple complaints including increased generalized weakness, abdominal pain and shortness of breath.  Imaging ordered head to rule out bleed or other intracranial pathology, chest x-ray to rule out pneumonia or fluid overload, abdominal scan to rule out diverticulitis.  Labs and imaging pending at time of sign off.  I ordered NS, patient reports pain is under control at this moment. Patients vitals assessed. Upon arrival patient is hemodynamically stable, with sinus bradycardia.  I have reviewed the patients home medicines and have made adjustments as needed  Consult: None  Plan:  Patient signed off to oncoming ED provider.  Labs and imaging  pending.         Final Clinical Impression(s) / ED Diagnoses Final diagnoses:  None    Rx / DC Orders ED Discharge Orders     None         Smitty Knudsen, PA-C 04/01/23 2348  Geoffery Lyons, MD 04/02/23 [phone]

## 2023-04-01 NOTE — ED Triage Notes (Signed)
Pt reports dizziness, weakness, scared she might fall, onset last night, worsening since then. Endorses blurry vision. Also reports SOB with exertion for same time.

## 2023-04-01 NOTE — Patient Instructions (Signed)
Proceed to nearest ED.

## 2023-04-01 NOTE — ED Triage Notes (Signed)
Pt states she believes her diverticulitis is flaring up which is causing her problems.

## 2023-04-02 DIAGNOSIS — R531 Weakness: Secondary | ICD-10-CM | POA: Diagnosis not present

## 2023-04-02 LAB — URINALYSIS, W/ REFLEX TO CULTURE (INFECTION SUSPECTED)
Bilirubin Urine: NEGATIVE
Glucose, UA: NEGATIVE mg/dL
Ketones, ur: NEGATIVE mg/dL
Leukocytes,Ua: NEGATIVE
Nitrite: NEGATIVE
Protein, ur: NEGATIVE mg/dL
Specific Gravity, Urine: 1.037 — ABNORMAL HIGH (ref 1.005–1.030)
pH: 6 (ref 5.0–8.0)

## 2023-04-02 LAB — TROPONIN I (HIGH SENSITIVITY)
Troponin I (High Sensitivity): 6 ng/L (ref <18)
Troponin I (High Sensitivity): 7 ng/L (ref <18)

## 2023-04-02 LAB — BRAIN NATRIURETIC PEPTIDE: B Natriuretic Peptide: 124.7 pg/mL — ABNORMAL HIGH (ref 0.0–100.0)

## 2023-04-02 MED ORDER — MECLIZINE HCL 25 MG PO TABS
25.0000 mg | ORAL_TABLET | Freq: Once | ORAL | Status: AC
  Administered 2023-04-02: 25 mg via ORAL
  Filled 2023-04-02: qty 1

## 2023-04-02 MED ORDER — MECLIZINE HCL 25 MG PO TABS: 25.0000 mg | ORAL_TABLET | Freq: Three times a day (TID) | ORAL | 1 refills | Status: AC | PRN

## 2023-04-02 NOTE — ED Notes (Signed)
Reviewed AVS with patient, patient expressed understanding of directions, denies further questions at this time. 

## 2023-04-02 NOTE — ED Notes (Signed)
Ambulated down the hall with patient, states dizziness has greatly improved, patient's gait was mostly steady, states she normally uses the walls at home for support while walking.

## 2023-04-02 NOTE — Discharge Instructions (Addendum)
Begin taking meclizine as prescribed.  Follow-up with your primary doctor if symptoms are not improving in the next few days, and return to the ER if symptoms significantly worsen or change.

## 2023-04-04 ENCOUNTER — Other Ambulatory Visit: Payer: Self-pay | Admitting: Physician Assistant

## 2023-04-13 ENCOUNTER — Ambulatory Visit (INDEPENDENT_AMBULATORY_CARE_PROVIDER_SITE_OTHER): Payer: Medicare Other | Admitting: Podiatry

## 2023-04-13 ENCOUNTER — Encounter: Payer: Self-pay | Admitting: Podiatry

## 2023-04-13 DIAGNOSIS — B351 Tinea unguium: Secondary | ICD-10-CM

## 2023-04-13 DIAGNOSIS — M79674 Pain in right toe(s): Secondary | ICD-10-CM

## 2023-04-13 DIAGNOSIS — M79675 Pain in left toe(s): Secondary | ICD-10-CM

## 2023-04-13 DIAGNOSIS — Q828 Other specified congenital malformations of skin: Secondary | ICD-10-CM

## 2023-04-13 DIAGNOSIS — E119 Type 2 diabetes mellitus without complications: Secondary | ICD-10-CM | POA: Diagnosis not present

## 2023-04-17 ENCOUNTER — Other Ambulatory Visit: Payer: Self-pay | Admitting: Family Medicine

## 2023-04-17 ENCOUNTER — Encounter: Payer: Self-pay | Admitting: Podiatry

## 2023-04-17 NOTE — Progress Notes (Signed)
Subjective:  Patient ID: Angela Pugh, female    DOB: 04-16-1946,  MRN: [id number]  Angela Pugh presents to clinic today for at risk foot care. Pt has h/o NIDDM with PAD and painful porokeratotic lesion(s) b/l feet and painful mycotic toenails that limit ambulation. Painful toenails interfere with ambulation. Aggravating factors include wearing enclosed shoe gear. Pain is relieved with periodic professional debridement. Painful porokeratotic lesions are aggravated when weightbearing with and without shoegear. Pain is relieved with periodic professional debridement.  Chief Complaint  Patient presents with   Diabetes    PATIENT STATES THAT SHE FEET ARE SORE , PATIENT STATES SHE SEEN HER PCP LAST WEEK , SHE STATES SHE DOES NOT REMEMBER HER A1C BUT SHE SAYS IT WAS GOOD.   New problem(s): None.   PCP is [name], MD.  Allergies  Allergen Reactions   Symbicort [Budesonide-Formoterol Fumarate] Shortness Of Breath, Swelling and Other (See Comments)    Pharyngeal edema, hoarseness, tightness in chest, lips became dark.   Acetaminophen Hives and Nausea Only   Azithromycin Hives and Nausea And Vomiting   Lipitor [Atorvastatin] Nausea Only    weakness   Pregabalin Nausea And Vomiting    dizziness   Zyrtec [Cetirizine] Hives    Review of Systems: Negative except as noted in the HPI.  Objective: No changes noted in today's physical examination. There were no vitals filed for this visit. Angela Pugh is a pleasant 77 y.o. female WD, WN in NAD. AAO x 3.  Vascular Examination: CFT <3 seconds b/l LE. Palpable DP pulse(s) right foot Faintly palpable PT pulse(s) b/l LE. Nonpalpable DP pulse(s) left foot. Pedal hair absent. No pain with calf compression b/l. Lower extremity skin temperature gradient within normal limits. No edema noted b/l LE. No cyanosis or clubbing noted b/l LE.  Dermatological Examination: Pedal skin is warm and supple b/l LE. No open wounds b/l LE. No  interdigital macerations noted b/l LE.   Toenails 2-5 bilaterally and L hallux elongated, discolored, dystrophic, thickened, and crumbly with subungual debris and tenderness to dorsal palpation.   Porokeratotic lesion(s) plantar heel right foot, posterolateral right heel and submet head 2 b/l. No erythema, no edema, no drainage, no fluctuance.  Neurological Examination: Pt has subjective symptoms of neuropathy. Protective sensation intact 5/5 intact bilaterally with 10g monofilament b/l. Vibratory sensation intact b/l.  Musculoskeletal Examination: Muscle strength 5/5 to all lower extremity muscle groups bilaterally. No pain, crepitus or joint limitation noted with ROM bilateral LE. Hammertoe deformity noted 2-5 b/l.  Assessment/Plan: 1. Pain due to onychomycosis of toenails of both feet   2. Porokeratosis   3. Diabetes mellitus without complication (HCC)    -Consent given for treatment as described below: -Examined patient. -Continue foot and shoe inspections daily. Monitor blood glucose per PCP/Endocrinologist's recommendations. -Continue supportive shoe gear daily. -Mycotic toenails 1-5 bilaterally were debrided in length and girth with sterile nail nippers and dremel without incident. -Porokeratotic lesion(s) right heel x 2 and submet head 2 b/l pared and enucleated with sterile currette without incident. Total number of lesions debrided=4. -Patient/POA to call should there be question/concern in the interim.   Return in about 3 months (around 07/14/2023).  Freddie Breech, DPM      [facility] LOCATION: [address]  [place], [place] [address]                   Office [phone]   [facility] LOCATION: [address] [place], [place] [address] Office [phone]

## 2023-04-21 ENCOUNTER — Ambulatory Visit (INDEPENDENT_AMBULATORY_CARE_PROVIDER_SITE_OTHER): Payer: Medicare Other | Admitting: Family Medicine

## 2023-04-21 ENCOUNTER — Encounter: Payer: Self-pay | Admitting: Family Medicine

## 2023-04-21 ENCOUNTER — Telehealth: Payer: Self-pay | Admitting: Orthopedic Surgery

## 2023-04-21 VITALS — BP 108/76 | HR 47 | Temp 98.5°F | Ht 68.0 in | Wt 169.4 lb

## 2023-04-21 DIAGNOSIS — H6992 Unspecified Eustachian tube disorder, left ear: Secondary | ICD-10-CM

## 2023-04-21 DIAGNOSIS — J0100 Acute maxillary sinusitis, unspecified: Secondary | ICD-10-CM | POA: Diagnosis not present

## 2023-04-21 MED ORDER — CODEINE SULFATE 15 MG PO TABS: 15.0000 mg | ORAL_TABLET | Freq: Four times a day (QID) | ORAL | 0 refills | Status: AC | PRN

## 2023-04-21 MED ORDER — AMOXICILLIN-POT CLAVULANATE 500-125 MG PO TABS: 1.0000 | ORAL_TABLET | Freq: Two times a day (BID) | ORAL | 0 refills | Status: AC

## 2023-04-21 NOTE — Telephone Encounter (Signed)
Pt called requesting  codine sulfate for pain 15 mg. Please send to [facility]. Pt phone number is [phone].

## 2023-04-21 NOTE — Progress Notes (Signed)
Established Patient Office Visit   Subjective  Patient ID: Angela Pugh, female    DOB: 1946/03/11  Age: 77 y.o. MRN: [id number]  Chief Complaint  Patient presents with   Dizziness    Patient came in today for a follow-up- patient went to the ER and was given meclizine, patient states she is doing better but still has some dizziness    Ear Fullness    Patient has some ear ringing and fullness     Pt is a 77 year old female seen for follow-up status post ED visit on 04/01/2023.  Patient initially seen in clinic however sent to ED given severity of symptoms.  Patient reported ongoing increased abdominal pain, decreased appetite, nausea, vomiting.  In ED x-ray, CT abdomen and CT head negative.  Given meclizine for vertigo.  Pt feeling better, but still having some dizziness.  Pt also notes ringing in ears and fullness.  Having nasal congestion and pressure in face.    Patient Active Problem List   Diagnosis Date Noted   Diverticulosis 04/01/2023   Constipation 02/18/2023   Radiculopathy, lumbar region 12/08/2022   Spondylolisthesis, lumbar region 12/08/2022   Headache above the eye region 02/19/2021   Nasal congestion 02/19/2021   Emphysema lung (HCC) 10/04/2020   Abdominal pain, epigastric 03/18/2020   Body mass index (BMI) 26.0-26.9, adult 10/23/2019   Nonischemic cardiomyopathy (HCC) 04/12/2018   Chest pain 01/25/2018   Left bundle branch block 01/25/2018   Carotid artery disease (HCC) 01/25/2018   Seasonal allergies 10/18/2017   Arthritis 10/18/2017   Pre-diabetes 10/18/2017   Protein-calorie malnutrition, severe 05/05/2016   Dysphagia 05/04/2016   Displacement of cervical intervertebral disc without myelopathy 03/18/2016   Carpal tunnel syndrome 02/10/2016   Cervical spondylosis with radiculopathy 02/10/2016   Abdominal pain 02/01/2016   Acute diverticulitis 02/01/2016   HLD (hyperlipidemia) 02/01/2016   Diabetes mellitus without complication (HCC) 02/01/2016    Hypokalemia 02/01/2016   Hypertension    Cervical spondylosis without myelopathy 01/13/2016   Bilateral carpal tunnel syndrome 01/13/2016   Herniated lumbar intervertebral disc 06/30/2013    Class: Acute   Low back pain 02/06/2013   Past Medical History:  Diagnosis Date   Arthritis    back, knees, shoulder, hands , injections in pelvis, for bone spurs (05/04/2016)   Chronic back pain    Diverticulosis    Dysrhythmia    LBBB   GERD (gastroesophageal reflux disease)    History of kidney stones    HLD (hyperlipidemia)    Hypertension    Migraine    hx (05/04/2016)   Peripheral neuropathy    Radiculopathy    Sinus complaint    Sinus headache    occasional (05/04/2016)   Type II diabetes mellitus (HCC)    diet controlled, no meds now, was on insulin & po treatment at one time     Vertigo    Past Surgical History:  Procedure Laterality Date   ANTERIOR CERVICAL DECOMP/DISCECTOMY FUSION N/A 04/17/2016   Procedure: ANTERIOR CERVICAL DECOMPRESSION/DISCECTOMY FUSION CERVICAL THREE - CERVICAL FOUR, POSTERIOR CERVICAL DISCECTOMY CERVICAL SEVEN -THORASIC ONE LEFT;  Surgeon: Coletta Memos, MD;  Location: [facility] OR;  Service: Neurosurgery;  Laterality: N/A;  Anterior/Posterior   ANTERIOR FUSION CERVICAL SPINE  2000   Dr. Channing Mutters; "for bone spurs; put hardware in too"   BACK SURGERY N/A 12/08/2022   COLONOSCOPY  06/10/2016   [facility]   COLONOSCOPY  2023   LASIK Bilateral    LEFT HEART CATH AND CORONARY ANGIOGRAPHY N/A 03/17/2018  Procedure: LEFT HEART CATH AND CORONARY ANGIOGRAPHY;  Surgeon: Runell Gess, MD;  Location: [facility];  Service: Cardiovascular;  Laterality: N/A;   LUMBAR LAMINECTOMY Left 06/30/2013   Procedure: Left L3-4 Extraforaminal approach to excise far lateral herniated nucleus pulposus;  Surgeon: Kerrin Champagne, MD;  Location: [facility] OR;  Service: Orthopedics;  Laterality: Left;   TUBAL LIGATION     VAGINAL HYSTERECTOMY     Social History   Tobacco Use   Smoking  status: Some Days    Current packs/day: 0.00    Average packs/day: 0.5 packs/day for 20.0 years (10.0 ttl pk-yrs)    Types: Cigarettes    Start date: 06/30/1962    Last attempt to quit: 06/30/1982    Years since quitting: 40.8   Smokeless tobacco: Never   Tobacco comments:    Patient only smokes in social setting when drinking. Admits former h/o heavy smoking years ago smoked 1.5 packs/day.    12/04/2021 Patient states she has not spoke for about 3 weeks    12/01/2022: pt will have a few drags if she is having a drink  Vaping Use   Vaping status: Never Used  Substance Use Topics   Alcohol use: Yes    Comment: special occasion   Drug use: No   Family History  Problem Relation Age of Onset   Uterine cancer Mother    Diabetes type II Sister    Diabetes Mellitus II Brother    Diverticulitis Daughter    Heart Problems Daughter    SIDS Son    Colon cancer Neg Hx    Pancreatic cancer Neg Hx    Esophageal cancer Neg Hx    Colon polyps Neg Hx    Rectal cancer Neg Hx    Stomach cancer Neg Hx    Allergies  Allergen Reactions   Symbicort [Budesonide-Formoterol Fumarate] Shortness Of Breath, Swelling and Other (See Comments)    Pharyngeal edema, hoarseness, tightness in chest, lips became dark.   Acetaminophen Hives and Nausea Only   Azithromycin Hives and Nausea And Vomiting   Lipitor [Atorvastatin] Nausea Only    weakness   Pregabalin Nausea And Vomiting    dizziness   Zyrtec [Cetirizine] Hives      ROS Negative unless stated above    Objective:     There were no vitals taken for this visit. BP Readings from Last 3 Encounters:  04/02/23 (!) 144/73  04/01/23 (!) 180/92  02/19/23 122/62   Wt Readings from Last 3 Encounters:  04/01/23 164 lb 12.8 oz (74.8 kg)  04/01/23 164 lb 12.8 oz (74.8 kg)  02/19/23 169 lb 9.6 oz (76.9 kg)      Physical Exam Constitutional:      General: She is not in acute distress.    Appearance: Normal appearance.  HENT:     Head:  Normocephalic and atraumatic.     Nose:     Right Sinus: Maxillary sinus tenderness present. No frontal sinus tenderness.     Left Sinus: Maxillary sinus tenderness present. No frontal sinus tenderness.     Mouth/Throat:     Mouth: Mucous membranes are moist.  Cardiovascular:     Rate and Rhythm: Normal rate and regular rhythm.     Heart sounds: Normal heart sounds. No murmur heard.    No gallop.  Pulmonary:     Effort: Pulmonary effort is normal. No respiratory distress.     Breath sounds: Normal breath sounds. No wheezing, rhonchi or rales.  Skin:    General: Skin is warm and dry.  Neurological:     Mental Status: She is alert and oriented to person, place, and time.      No results found for any visits on 04/21/23.    Assessment & Plan:  Acute maxillary sinusitis, recurrence not specified -     Amoxicillin-Pot Clavulanate; Take 1 tablet by mouth in the morning and at bedtime for 7 days.  Dispense: 14 tablet; Refill: 0  Eustachian tube dysfunction, left  Start abx for acute sinusitis.  Allergy to azithromycin.  Consider supportive care with Flonase, antihistamine, or OTC saline nasal rinse.  Given strict precautions.  Continue meclizine as needed for dizziness.  Return if symptoms worsen or fail to improve.   [name], MD

## 2023-05-06 ENCOUNTER — Other Ambulatory Visit: Payer: Self-pay | Admitting: Physician Assistant

## 2023-05-06 ENCOUNTER — Telehealth: Payer: Self-pay | Admitting: Cardiovascular Disease

## 2023-05-06 DIAGNOSIS — R1013 Epigastric pain: Secondary | ICD-10-CM

## 2023-05-06 NOTE — Telephone Encounter (Signed)
Called and spoke to patient. Verified name and DOB. Patient report she has been having what feels like an electric shock on the right side of her chest for 2-3 month. The pain is not daily. She said while she was outside yesterday doing yard work it happened 2-3 times.She also complained of SOB, dizziness, chest pain nausea and some lightheadedness that comes and goes. She also have left arm numbness that comes and goes. She deny having symptoms at this time. Patient was scheduled for office visit with 05/20/23 at 8:25 am with Micah Flesher, PA. Advised patient if symptoms worsen, doesn't go away or she develops new symptoms that she should be seen in the ED. Patient verbalized understanding and agree.

## 2023-05-06 NOTE — Telephone Encounter (Signed)
Patient called in to say that she gets little shock on her right side of her body.for a few secs then it goes way. Sometime she experience some dizziness for time to time. Please advise

## 2023-05-14 ENCOUNTER — Telehealth: Payer: Self-pay | Admitting: Gastroenterology

## 2023-05-14 NOTE — Telephone Encounter (Signed)
PT needs to have a refill for the compound cream, Diltiazem/lidocaine. She also wanted to report to [place] that she has a very foul odor coming out of her mouth as well. Requesting call back.

## 2023-05-16 NOTE — Progress Notes (Signed)
 Cardiology Office Note:    Date:  05/20/2023   ID:  Dwayne Prescott Christians, DOB Oct 14, 1945, MRN [id number]  PCP:  Mercer Clotilda SAUNDERS, MD   [facility] Providers Cardiologist:  Dorn Lesches, MD Advanced Heart Failure:  Toribio Fuel, MD     Referring MD: Mercer Clotilda SAUNDERS, MD   Chief Complaint  Patient presents with   Follow-up    NICM, DOE    History of Present Illness:    Angela Pugh is a 77 y.o. female with a hx of HTN, DM, carotid artery stenosis, LBBB, NICM with normal coronaries by heart catheterization.   Coronary CTA 02/2018 showed FFR 0.78 in the proximal LAD prompting LHC. LHC showed normal coronary arteries and moderate LV dysfunction with LVEF 40%. She was referred to AHF clinic and cardiac MRI showed no evidence of myocarditis. Heart monitor was NSR. GDMT titrated for NICM with baseline LVEF 35-40%. Most recent echo 12/2021 with normalized LVEF 50-55%, grade 1 DD, mild MR.   Of note, she is on 81 mg ASA and also taking Goody powders.  She wen to the ER 04/01/23 with feeling  unwell. CE negative x 2 and BNP minimally elevated. She thinks she had a sinus infection.   She has had a cough since Tues (today is Thurs) with productive sneezing of yellow snot.   She called our office stating she had electrical sensations on the left side of her chest x 2-3 months and placed on my schedule. She reports having pain running down both arms that feels like an electric shock. She also reports DOE with minimal exertion. She climbed the escalator and stairs - 2 flights of stairs in total, this caused significant SOB. She continues to smoke cigarettes when she smokes, much less than previously.    Past Medical History:  Diagnosis Date   Arthritis    back, knees, shoulder, hands , injections in pelvis, for bone spurs (05/04/2016)   Chronic back pain    Diverticulosis    Dysrhythmia    LBBB   GERD (gastroesophageal reflux disease)    History of kidney stones    HLD  (hyperlipidemia)    Hypertension    Migraine    hx (05/04/2016)   Peripheral neuropathy    Radiculopathy    Sinus complaint    Sinus headache    occasional (05/04/2016)   Type II diabetes mellitus (HCC)    diet controlled, no meds now, was on insulin  & po treatment at one time     Vertigo     Past Surgical History:  Procedure Laterality Date   ANTERIOR CERVICAL DECOMP/DISCECTOMY FUSION N/A 04/17/2016   Procedure: ANTERIOR CERVICAL DECOMPRESSION/DISCECTOMY FUSION CERVICAL THREE - CERVICAL FOUR, POSTERIOR CERVICAL DISCECTOMY CERVICAL SEVEN -THORASIC ONE LEFT;  Surgeon: Rockey Peru, MD;  Location: [facility] OR;  Service: Neurosurgery;  Laterality: N/A;  Anterior/Posterior   ANTERIOR FUSION CERVICAL SPINE  2000   Dr. Gaither; for bone spurs; put hardware in too   BACK SURGERY N/A 12/08/2022   COLONOSCOPY  06/10/2016   Gabem   COLONOSCOPY  2023   LASIK Bilateral    LEFT HEART CATH AND CORONARY ANGIOGRAPHY N/A 03/17/2018   Procedure: LEFT HEART CATH AND CORONARY ANGIOGRAPHY;  Surgeon: Lesches Dorn PARAS, MD;  Location: [facility] INVASIVE CV LAB;  Service: Cardiovascular;  Laterality: N/A;   LUMBAR LAMINECTOMY Left 06/30/2013   Procedure: Left L3-4 Extraforaminal approach to excise far lateral herniated nucleus pulposus;  Surgeon: Lynwood FORBES Better, MD;  Location: [facility] OR;  Service: Orthopedics;  Laterality: Left;   TUBAL LIGATION     VAGINAL HYSTERECTOMY      Current Medications: Current Meds  Medication Sig   ABRYSVO 120 MCG/0.5ML injection    amLODipine  (NORVASC ) 5 MG tablet TAKE 1 TABLET(5 MG) BY MOUTH DAILY   aspirin  81 MG EC tablet Take 81 mg by mouth daily. Swallow whole.   Blood Glucose Monitoring Suppl DEVI 1 each by Does not apply route in the morning, at noon, and at bedtime. May substitute to any manufacturer covered by patient's insurance.   Calcium  Carb-Cholecalciferol  (CALCIUM  500 + D PO) Take 1 tablet by mouth daily.   clobetasol  ointment (TEMOVATE ) 0.05 % Apply 1 Application topically 2  (two) times daily. (Patient taking differently: Apply 1 Application topically 2 (two) times daily as needed (rash).)   ferrous sulfate  325 (65 FE) MG EC tablet Take 325 mg by mouth daily.   Glucose Blood (BLOOD GLUCOSE TEST STRIPS) STRP 1 each by In Vitro route in the morning, at noon, and at bedtime. May substitute to any manufacturer covered by patient's insurance.   guaiFENesin (MUCINEX) 600 MG 12 hr tablet Take by mouth 2 (two) times daily.   Lancets Misc. MISC 1 each by Does not apply route in the morning, at noon, and at bedtime. May substitute to any manufacturer covered by patient's insurance.   meclizine  (ANTIVERT ) 25 MG tablet Take 1 tablet (25 mg total) by mouth 3 (three) times daily as needed for dizziness.   mesalamine  (LIALDA ) 1.2 g EC tablet TAKE 4 TABLETS(4.8 GRAMS) BY MOUTH DAILY WITH BREAKFAST   Multiple Vitamins-Minerals (CENTRUM ADULTS PO) Take 1 tablet by mouth daily.    NON FORMULARY Diltiazem 2%/Lidocaine5% compound Use 3 x rectally daily for 2 months to heal anal fissure   olmesartan  (BENICAR ) 5 MG tablet TAKE 2 TABLETS BY MOUTH DAILY   Omega-3 Fatty Acids (FISH OIL PO) Take 700 mg by mouth daily.   pantoprazole  (PROTONIX ) 40 MG tablet Take 1 tablet (40 mg total) by mouth 2 (two) times daily before a meal.   potassium chloride  SA (KLOR-CON  M) 20 MEQ tablet TAKE 1 TABLET(20 MEQ) BY MOUTH DAILY (Patient taking differently: Take 40 mEq by mouth daily.)   PREDNISOLON-MOXIFLOX-BROMFENAC OP Place 1 drop into both eyes daily as needed (irritation).   PREVNAR 20 0.5 ML injection    sucralfate  (CARAFATE ) 1 g tablet TAKE 1 TABLET(1 GRAM) BY MOUTH FOUR TIMES DAILY- WITH MEALS AND AT BEDTIME   triamcinolone  cream (KENALOG ) 0.5 % Apply topically 2 (two) times daily.   vitamin B-12 (CYANOCOBALAMIN ) 1000 MCG tablet Take 1,000 mcg by mouth daily.   [DISCONTINUED] rosuvastatin  (CRESTOR ) 20 MG tablet TAKE 1 TABLET(20 MG) BY MOUTH DAILY   Current Facility-Administered Medications for the  05/20/23 encounter (Office Visit) with Madie Jon Garre, PA  Medication   diclofenac  Sodium (VOLTAREN ) 1 % topical gel 2 g     Allergies:   Symbicort  [budesonide -formoterol  fumarate], Acetaminophen , Azithromycin, Lipitor [atorvastatin], Pregabalin , and Zyrtec [cetirizine]   Social History   Socioeconomic History   Marital status: Widowed    Spouse name: Not on file   Number of children: 6   Years of education: Not on file   Highest education level: Not on file  Occupational History   Occupation: retired  Tobacco Use   Smoking status: Some Days    Current packs/day: 0.00    Average packs/day: 0.5 packs/day for 20.0 years (10.0 ttl pk-yrs)    Types: Cigarettes    Start  date: 06/30/1962    Last attempt to quit: 06/30/1982    Years since quitting: 40.9   Smokeless tobacco: Never   Tobacco comments:    Patient only smokes in social setting when drinking. Admits former h/o heavy smoking years ago smoked 1.5 packs/day.    12/04/2021 Patient states she has not spoke for about 3 weeks    12/01/2022: pt will have a few drags if she is having a drink  Vaping Use   Vaping status: Never Used  Substance and Sexual Activity   Alcohol use: Yes    Comment: special occasion   Drug use: No   Sexual activity: Not Currently  Other Topics Concern   Not on file  Social History Narrative   Not on file   Social Drivers of Health   Financial Resource Strain: Low Risk  (02/19/2023)   Overall Financial Resource Strain (CARDIA)    Difficulty of Paying Living Expenses: Not hard at all  Food Insecurity: No Food Insecurity (02/19/2023)   Hunger Vital Sign    Worried About Running Out of Food in the Last Year: Never true    Ran Out of Food in the Last Year: Never true  Transportation Needs: No Transportation Needs (02/19/2023)   PRAPARE - Administrator, Civil Service (Medical): No    Lack of Transportation (Non-Medical): No  Physical Activity: Sufficiently Active (02/19/2023)   Exercise  Vital Sign    Days of Exercise per Week: 7 days    Minutes of Exercise per Session: 60 min  Stress: No Stress Concern Present (02/19/2023)   [employer] of Occupational Health - Occupational Stress Questionnaire    Feeling of Stress : Not at all  Social Connections: Moderately Integrated (02/19/2023)   Social Connection and Isolation Panel [NHANES]    Frequency of Communication with Friends and Family: More than three times a week    Frequency of Social Gatherings with Friends and Family: More than three times a week    Attends Religious Services: More than 4 times per year    Active Member of [employer] or Organizations: Yes    Attends Banker Meetings: More than 4 times per year    Marital Status: Widowed     Family History: The patient's family history includes Diabetes Mellitus II in her brother; Diabetes type II in her sister; Diverticulitis in her daughter; Heart Problems in her daughter; SIDS in her son; Uterine cancer in her mother. There is no history of Colon cancer, Pancreatic cancer, Esophageal cancer, Colon polyps, Rectal cancer, or Stomach cancer.  ROS:   Please see the history of present illness.     All other systems reviewed and are negative.  EKGs/Labs/Other Studies Reviewed:    The following studies were reviewed today:  Cardiac Studies & Procedures   CARDIAC CATHETERIZATION  CARDIAC CATHETERIZATION 03/17/2018  Narrative Images from the original result were not included.   There is mild to moderate left ventricular systolic dysfunction.  LV end diastolic pressure is mildly elevated.  The left ventricular ejection fraction is 35-45% by visual estimate.  Shontavia Mickel is a 77 y.o. female   [id number] LOCATION:  FACILITY: [facility] PHYSICIAN: Dorn Lesches, M.D. 23-Sep-1945   DATE OF PROCEDURE:  03/17/2018  DATE OF DISCHARGE:     CARDIAC CATHETERIZATION    History obtained from chart review.Veryl Abril is a 77 y.o.  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  She was referred by Dr. Mercer for cardiovascular evaluation because of newly recognized left bundle branch block and chest pain.  I last saw her in the office 01/25/2018. She does have a history of hypertension and diet-controlled diabetes.  She is never had a heart attack or stroke.  She has had recent carotid Dopplers performed in May revealing moderate bilateral ICA stenosis.  She had a newly recognized left bundle branch block as well.  She is had chest pain for several years which has increased in frequency and severity now occurring on a daily basis.  Is no relation to food or activity. A CT FFR performed 03/04/2018 showed a physiologically significant lesion in the proximal LAD with an FFR of 0.78.  Based on this, we decided to proceed with outpatient diagnostic radial cardiac catheterization.  Impression Ms. Cleland has essentially normal coronary arteries and mild to moderate LV dysfunction.  Her EF is in the 40 to 45% range.  I believe her chest pain is noncardiac and a CT FFR was falsely positive.  A minx closure device was successfully deployed achieving hemostasis.  The patient left the lab in stable condition.  She will be hydrated for the next several hours and discharged home.  She will be followed closely as an outpatient.  Dorn Lesches. MD, [facility] 03/17/2018 11:06 AM      Recommend Aspirin  81mg  daily for moderate CAD.  Findings Coronary Findings Diagnostic  Dominance: Right  No diagnostic findings have been documented. Intervention  No interventions have been documented.   STRESS TESTS  NM MYOCAR MULTI W/SPECT W 02/22/2013  Narrative *RADIOLOGY REPORT*  Clinical Data:  Shortness of breath, history of diabetes, hypertension, initial encounter.  MYOCARDIAL IMAGING WITH SPECT (REST AND PHARMACOLOGIC-STRESS) GATED LEFT VENTRICULAR WALL MOTION  STUDY LEFT VENTRICULAR EJECTION FRACTION  Technique:  Standard myocardial SPECT imaging was performed after resting intravenous injection of 10 mCi Tc-58m sestamibi. Subsequently, intravenous infusion of lexiscan  was performed under the supervision of the Cardiology staff.  At peak effect of the drug, 30 mCi Tc-11m sestamibi was injected intravenously and standard myocardial SPECT  imaging was performed.  Quantitative gated imaging was also performed to evaluate left ventricular wall motion, and estimate left ventricular ejection fraction.  Comparison:  Chest radiograph - 02/22/2013  Findings:  Review of the rotational raw images demonstrates moderate breast attenuation.  No significant patient motion artifact.  SPECT imaging demonstrates minimal amount of attenuation involving the inferior wall which improves on the provided stress images. There is a matched attenuation involving the anterior wall which is without associated wall motion abnormality.  Quantitative gated analysis shows normal wall motion.  The resting left ventricular ejection fraction is 67% with end- diastolic volume of 96 ml and end-systolic volume of 32 ml.  IMPRESSION: 1.  Matched attenuation involving the anterior wall without associated wall motion abnormality.  No definitive scintigraphic evidence of prior infarction or pharmacologically induced ischemia. 2.  Normal wall motion.  Ejection fraction - 67%.   Original Report Authenticated By: Norleen Adele GAILS, MD  ECHOCARDIOGRAM  ECHOCARDIOGRAM COMPLETE 12/18/2021  Narrative ECHOCARDIOGRAM REPORT    Patient Name:   AFTYN NOTT Date of Exam: 12/18/2021 Medical Rec #:  [id number]          Height:       69.0 in Accession #:    [id number]         Weight:       172.4 lb Date of Birth:  Jan 14, 1946  BSA:          1.940 m Patient Age:    76 years           BP:           150/80 mmHg Patient Gender: F                  HR:           69 bpm. Exam  Location:  [address]  Procedure: 2D Echo, Cardiac Doppler and Color Doppler  Indications:    R06.09 Dyspnea on Exertion  History:        Patient has prior history of Echocardiogram examinations, most recent 11/11/2020. Risk Factors:Hypertension, Diabetes and Dyslipidemia.  Sonographer:    Carl Coma RDCS Referring Phys: [id number] JENNIFER K LAMBERT  IMPRESSIONS   1. Left ventricular ejection fraction, by estimation, is 50 to 55%. The left ventricle has low normal function. The left ventricle has no regional wall motion abnormalities. Left ventricular diastolic parameters are consistent with Grade I diastolic dysfunction (impaired relaxation). 2. Right ventricular systolic function is normal. The right ventricular size is normal. 3. Left atrial size was mildly dilated. 4. The mitral valve is normal in structure. Mild mitral valve regurgitation. No evidence of mitral stenosis. 5. The aortic valve is tricuspid. Aortic valve regurgitation is mild. Aortic valve sclerosis/calcification is present, without any evidence of aortic stenosis. 6. Aortic dilatation noted. There is mild dilatation of the ascending aorta, measuring 40 mm. 7. The inferior vena cava is normal in size with greater than 50% respiratory variability, suggesting right atrial pressure of 3 mmHg.  FINDINGS Left Ventricle: Left ventricular ejection fraction, by estimation, is 50 to 55%. The left ventricle has low normal function. The left ventricle has no regional wall motion abnormalities. The left ventricular internal cavity size was normal in size. There is no left ventricular hypertrophy. Left ventricular diastolic parameters are consistent with Grade I diastolic dysfunction (impaired relaxation). Indeterminate filling pressures.  Right Ventricle: The right ventricular size is normal. Right ventricular systolic function is normal.  Left Atrium: Left atrial size was mildly dilated.  Right Atrium: Right atrial  size was normal in size.  Pericardium: There is no evidence of pericardial effusion.  Mitral Valve: The mitral valve is normal in structure. Mild mitral annular calcification. Mild mitral valve regurgitation. No evidence of mitral valve stenosis.  Tricuspid Valve: The tricuspid valve is normal in structure. Tricuspid valve regurgitation is mild . No evidence of tricuspid stenosis.  Aortic Valve: The aortic valve is tricuspid. Aortic valve regurgitation is mild. Aortic regurgitation PHT measures 447 msec. Aortic valve sclerosis/calcification is present, without any evidence of aortic stenosis.  Pulmonic Valve: The pulmonic valve was normal in structure. Pulmonic valve regurgitation is not visualized. No evidence of pulmonic stenosis.  Aorta: Aortic dilatation noted. There is mild dilatation of the ascending aorta, measuring 40 mm.  Venous: The inferior vena cava is normal in size with greater than 50% respiratory variability, suggesting right atrial pressure of 3 mmHg.  IAS/Shunts: No atrial level shunt detected by color flow Doppler.   LEFT VENTRICLE PLAX 2D LVIDd:         4.60 cm   Diastology LVIDs:         3.40 cm   LV e' medial:    5.55 cm/s LV PW:         1.00 cm   LV E/e' medial:  10.8 LV IVS:        1.00 cm  LV e' lateral:   7.29 cm/s LVOT diam:     1.90 cm   LV E/e' lateral: 8.2 LV SV:         44 LV SV Index:   23 LVOT Area:     2.84 cm   RIGHT VENTRICLE RV Basal diam:  4.30 cm RV S prime:     14.50 cm/s TAPSE (M-mode): 2.6 cm  LEFT ATRIUM             Index        RIGHT ATRIUM           Index LA diam:        5.00 cm 2.58 cm/m   RA Area:     17.00 cm LA Vol (A2C):   78.0 ml 40.21 ml/m  RA Volume:   48.90 ml  25.21 ml/m LA Vol (A4C):   75.9 ml 39.13 ml/m LA Biplane Vol: 78.7 ml 40.57 ml/m AORTIC VALVE LVOT Vmax:   101.10 cm/s LVOT Vmean:  66.700 cm/s LVOT VTI:    0.154 m AI PHT:      447 msec  AORTA Ao Root diam: 3.70 cm Ao Asc diam:  4.00 cm  MITRAL  VALVE                TRICUSPID VALVE MV Area (PHT): 2.58 cm     TR Peak grad:   24.8 mmHg MV Decel Time: 294 msec     TR Vmax:        249.00 cm/s MV E velocity: 59.70 cm/s MV A velocity: 101.00 cm/s  SHUNTS MV E/A ratio:  0.59         Systemic VTI:  0.15 m Systemic Diam: 1.90 cm  Redell Shallow MD Electronically signed by Redell Shallow MD Signature Date/Time: 12/18/2021/2:33:53 PM    Final   MONITORS  LONG TERM MONITOR (3-14 DAYS) 10/31/2020  Narrative Patch Wear Time:  13 days and 21 hours (2022-05-30T10:27:47-0400 to 2022-06-13T08:08:19-0400)  Patient had a min HR of 35 bpm, max HR of 169 bpm, and avg HR of 56 bpm. Predominant underlying rhythm was Sinus Rhythm. First Degree AV Block was present. Bundle Branch Block/IVCD was present. 7 Supraventricular Tachycardia runs occurred, the run with the fastest interval lasting 17 beats with a max rate of 169 bpm (avg 125 bpm); the run with the fastest interval was also the longest. Isolated SVEs were rare (<1.0%), SVE Couplets were rare (<1.0%), and SVE Triplets were rare (<1.0%). Isolated VEs were rare (<1.0%), VE Couplets were rare (<1.0%), and no VE Triplets were present.   1: Sinus rhythm/sinus bradycardia (rates as low as the 30s) 2: Occasional PVCs and PACs 3: Short runs of SVT  CT SCANS  CT CORONARY MORPH W/CTA COR W/SCORE 03/02/2018  Addendum 03/03/2018 11:15 AM ADDENDUM REPORT: 03/03/2018 11:13  CLINICAL DATA:  4F with hypertension, hyperlipidemia, LBBB and atypical chest pain.  EXAM: Cardiac/Coronary  CT  TECHNIQUE: The patient was scanned on a [employer].  FINDINGS: A 120 kV prospective scan was triggered in the descending thoracic aorta at 111 HU's. Axial non-contrast 3 mm slices were carried out through the heart. The data set was analyzed on a dedicated work station and scored using the Agatson method. Gantry rotation speed was 250 msecs and collimation was .6 mm. No beta blockade and 0.8 mg of  sl NTG was given. The 3D data set was reconstructed in 5% intervals of the 67-82 % of the R-R cycle. Diastolic phases were analyzed  on a dedicated work station using MPR, MIP and VRT modes. The patient received 100 cc of contrast.  Aorta: Ascending aorta mildly dilated (3.85cm). No calcifications. No dissection.  Aortic Valve:  Trileaflet.  Mild calcification.  Mitral Valve: Mild mitral annular calcification.  Coronary Arteries:  Normal coronary origin.  Right dominance.  RCA is a large dominant artery that gives rise to PDA and PLVB. There is minimal (1-24%) calcified plaque in the proximal and mid RCA.  Left main is a large artery that gives rise to LAD and LCX arteries. There is no plaque.  LAD is a large vessel that has a long, moderate (50-69%) mixed calcified and low attenuation plaque in the ostial to proximal LAD. There is minimal (1-24%) calcified plaque in the mid and distal LAD. There is a large, branching D1 without evidence of significant disease.  LCX is a non-dominant artery that gives rise to one large OM1 branch. There is minimal (1-24%) calcified plaque in the proximal and distal LCX. There is minimal (1-24%) mixed plaque in mid OM1.  Other findings:  Normal pulmonary vein drainage into the left atrium.  Normal let atrial appendage without a thrombus.  Normal size of the pulmonary artery.  IMPRESSION: 1. Coronary calcium  score of 136. This was 79th percentile for age and sex matched control.  2. Normal coronary origin with right dominance.  3. Mild CAD in the RCA and LCX arteries. Moderate disease in the ostial to proximal LAD.  4. Recommend aggressive risk factor modification and high potency statin.  5.  This study will be sent for FFR.  Annabella Scarce, MD   Electronically Signed By: Annabella Scarce On: 03/03/2018 11:13  Narrative EXAM: OVER-READ INTERPRETATION  CT CHEST  The following report is an over-read performed by  radiologist Dr. Marcey Moan of [facility] on 03/03/2018. This over-read does not include interpretation of cardiac or coronary anatomy or pathology. The coronary CTA interpretation by the cardiologist is attached.  COMPARISON:  None.  FINDINGS: Vascular: No incidental findings. Central pulmonary arteries are normal in caliber.  Mediastinum/Nodes: Visualized mediastinum shows no evidence masses or lymphadenopathy.  Lungs/Pleura: Visualized chest shows no evidence of pulmonary edema, consolidation, pneumothorax, nodule or pleural fluid.  Upper Abdomen: No acute abnormality.  Musculoskeletal: No chest wall mass or suspicious bone lesions identified.  IMPRESSION: No significant incidental nonvascular/noncardiac findings in the visualized chest.  Electronically Signed: By: Marcey Moan M.D. On: 03/03/2018 07:12  CARDIAC MRI  MR CARDIAC MORPHOLOGY W WO CONTRAST 06/23/2018  Narrative CLINICAL DATA:  Nonischemic cardiomyopathy  EXAM: CARDIAC MRI  TECHNIQUE: The patient was scanned on a 1.5 Tesla GE magnet. A dedicated cardiac coil was used. Functional imaging was done using [employer] sequences. 2,3, and 4 chamber views were done to assess for RWMA's. Modified Simpson's rule using a short axis stack was used to calculate an ejection fraction on a dedicated work [employer]. The patient received 9 cc of Gadavist . After 10 minutes inversion recovery sequences were used to assess for infiltration and scar tissue.  FINDINGS: Limited images of the lung fields showed no gross abnormalities.  Normal left ventricular size and thickness. There was septal-lateral dyssynchrony consistent with LBBB, EF 47%. Normal right ventricular size and systolic function. Mild left atrial enlargement. Normal right atrium. Lipomatous atrial septal hypertrophy. Trileaflet aortic valve with no significant regurgitation or stenosis. No significant mitral  regurgitation though flow sequences to quantify were not done.  Delayed enhancement imaging showed no myocardial late gadolinium  enhancement (LGE).  Measurements:  LVEDV 129 mL  LVSV 60 mL  LVEF 47%  IMPRESSION: 1.  Normal LV size with EF 47%, septal-lateral dyssynchrony.  2.  Normal RV size and systolic function.  3. No myocardial LGE, so no evidence for prior MI, infiltrative disease, or myocarditis.  Possible LBBB cardiomyopathy.  Dalton Mclean   Electronically Signed By: Ezra Shuck M.D. On: 06/24/2018 14:52          EKG Interpretation Date/Time:  Thursday May 20 2023 08:58:23 EST Ventricular Rate:  67 PR Interval:  184 QRS Duration:  152 QT Interval:  446 QTC Calculation: 471 R Axis:   57  Text Interpretation: Normal sinus rhythm Left bundle branch block When compared with ECG of 01-Apr-2023 22:33, No significant change was found Confirmed by Madie Slough ([id number]) on 05/20/2023 9:02:30 AM    Recent Labs: 02/12/2023: TSH 1.88 04/01/2023: ALT 8; B Natriuretic Peptide 124.7; BUN 8; Creatinine, Ser 0.86; Hemoglobin 15.3; Platelets 234; Potassium 3.6; Sodium 137  Recent Lipid Panel    Component Value Date/Time   CHOL 191 02/12/2023 1003   CHOL 205 (H) 06/23/2022 1051   TRIG 136.0 02/12/2023 1003   HDL 66.70 02/12/2023 1003   HDL 56 06/23/2022 1051   CHOLHDL 3 02/12/2023 1003   VLDL 27.2 02/12/2023 1003   LDLCALC 97 02/12/2023 1003   LDLCALC 119 (H) 06/23/2022 1051     Risk Assessment/Calculations:                Physical Exam:    VS:  BP 110/80   Pulse 63   Ht 5' 9 (1.753 m)   Wt 164 lb (74.4 kg)   SpO2 94%   BMI 24.22 kg/m     Wt Readings from Last 3 Encounters:  05/20/23 164 lb (74.4 kg)  04/21/23 169 lb 6.4 oz (76.8 kg)  04/01/23 164 lb 12.8 oz (74.8 kg)     GEN:  Well nourished, well developed in no acute distress HEENT: Normal NECK: No JVD; No carotid bruits LYMPHATICS: No lymphadenopathy CARDIAC: RRR, no murmurs,  rubs, gallops RESPIRATORY:  expiratory wheezing L > R, nonproductive cough  ABDOMEN: Soft, non-tender, non-distended MUSCULOSKELETAL:  No edema; No deformity  SKIN: Warm and dry NEUROLOGIC:  Alert and oriented x 3 PSYCHIATRIC:  Normal affect   ASSESSMENT:    1. Primary hypertension   2. Bilateral arm pain   3. DOE (dyspnea on exertion)   4. Nonischemic cardiomyopathy (HCC)   5. Left bundle branch block   6. Mixed hyperlipidemia   7. Pulmonary emphysema, unspecified emphysema type (HCC)    PLAN:    In order of problems listed above:  Bilateral arm pain DOE - DOE complicated by likely COPD and ongoing URI - EKG today with stable LBBB - will repeat echocardiogram - she does not appear volume up, I advised to cut back on Gatorade and juices - the arm pain sounds related to nerve issues - feels mostly numbness and tingling, but started this past summer - no CAD on heart cath 2019   Chronic systolic heart failure secondary to NICM - LVEF 35-40% - GDMT: 5 mg olmesartan  - repeating echo as above   Hypertension - olmesartan  and amlodipine    Hyperlipidemia with LDL goal < 70 02/12/2023: Cholesterol 191; HDL 66.70; LDL Cholesterol 97; Triglycerides 136.0; VLDL 27.2 - omega 3 fatty acids, 20 mg crestor  - increase crestor  to 40 mg   Tobacco abuse Cough DOE - wheezing on exam - will refer  to pulmonology to evaluate for COPD      Follow up in 4-6 weeks with me.       Medication Adjustments/Labs and Tests Ordered: Current medicines are reviewed at length with the patient today.  Concerns regarding medicines are outlined above.  Orders Placed This Encounter  Procedures   Ambulatory referral to Pulmonology   EKG 12-Lead   ECHOCARDIOGRAM COMPLETE   Meds ordered this encounter  Medications   rosuvastatin  (CRESTOR ) 40 MG tablet    Sig: Take 1 tablet (40 mg total) by mouth daily.    Dispense:  90 tablet    Refill:  0    Patient Instructions  Medication  Instructions:  INCREASE CRESTOR  40MG  *If you need a refill on your cardiac medications before your next appointment, please call your pharmacy*  Lab Work: NONE  Testing/Procedures: Echocardiogram - Your physician has requested that you have an echocardiogram. Echocardiography is a painless test that uses sound waves to create images of your heart. It provides your doctor with information about the size and shape of your heart and how well your heart's chambers and valves are working. This procedure takes approximately one hour. There are no restrictions for this procedure.    REFERRAL TO PULMONARY-SOMEONE WILL BE CALLING TO SCHEDULE APPOINTMENT  Follow-Up: At [facility], you and your health needs are our priority.  As part of our continuing mission to provide you with exceptional heart care, we have created designated Provider Care Teams.  These Care Teams include your primary Cardiologist (physician) and Advanced Practice Providers (APPs -  Physician Assistants and Nurse Practitioners) who all work together to provide you with the care you need, when you need it.  We recommend signing up for the patient portal called MyChart.  Sign up information is provided on this After Visit Summary.  MyChart is used to connect with patients for Virtual Visits (Telemedicine).  Patients are able to view lab/test results, encounter notes, upcoming appointments, etc.  Non-urgent messages can be sent to your provider as well.   To learn more about what you can do with MyChart, go to [email or web address].    Your next appointment:   4-6 week(s)  Provider:   Jon Hails, PA-C      Other Instructions        Signed, Jon Nat Hails, PA  05/20/2023 9:48 AM    [facility]

## 2023-05-20 ENCOUNTER — Telehealth: Payer: Self-pay | Admitting: Orthopedic Surgery

## 2023-05-20 ENCOUNTER — Encounter: Payer: Self-pay | Admitting: Physician Assistant

## 2023-05-20 ENCOUNTER — Ambulatory Visit: Payer: Medicare Other | Attending: Physician Assistant | Admitting: Physician Assistant

## 2023-05-20 VITALS — BP 110/80 | HR 63 | Ht 69.0 in | Wt 164.0 lb

## 2023-05-20 DIAGNOSIS — M79601 Pain in right arm: Secondary | ICD-10-CM

## 2023-05-20 DIAGNOSIS — I428 Other cardiomyopathies: Secondary | ICD-10-CM

## 2023-05-20 DIAGNOSIS — J439 Emphysema, unspecified: Secondary | ICD-10-CM | POA: Diagnosis not present

## 2023-05-20 DIAGNOSIS — E782 Mixed hyperlipidemia: Secondary | ICD-10-CM

## 2023-05-20 DIAGNOSIS — I447 Left bundle-branch block, unspecified: Secondary | ICD-10-CM

## 2023-05-20 DIAGNOSIS — M79602 Pain in left arm: Secondary | ICD-10-CM | POA: Diagnosis not present

## 2023-05-20 DIAGNOSIS — I10 Essential (primary) hypertension: Secondary | ICD-10-CM | POA: Diagnosis not present

## 2023-05-20 DIAGNOSIS — R0609 Other forms of dyspnea: Secondary | ICD-10-CM | POA: Diagnosis not present

## 2023-05-20 MED ORDER — ROSUVASTATIN CALCIUM 40 MG PO TABS: 40.0000 mg | ORAL_TABLET | Freq: Every day | ORAL | 0 refills | Status: DC

## 2023-05-20 MED ORDER — CODEINE SULFATE 15 MG PO TABS: 15.0000 mg | ORAL_TABLET | Freq: Four times a day (QID) | ORAL | 0 refills | Status: AC | PRN

## 2023-05-20 NOTE — Addendum Note (Signed)
 Addended by: Willia Craze on: 05/20/2023 08:48 PM   Modules accepted: Orders

## 2023-05-20 NOTE — Telephone Encounter (Signed)
 Pt advised to call our office if she is still experiencing symptoms. Call came in 6 days ago while I was off for Christmas and New years. Marland Kitchen

## 2023-05-20 NOTE — Telephone Encounter (Signed)
 Line busy

## 2023-05-20 NOTE — Telephone Encounter (Signed)
 Left message on machine to call back

## 2023-05-20 NOTE — Telephone Encounter (Signed)
 Patient calling in regards to previus note. Please advise.   Thank you

## 2023-05-20 NOTE — Telephone Encounter (Signed)
 Patient called and ask if you could call something in for her pain in her back. CB#[phone]

## 2023-05-20 NOTE — Patient Instructions (Addendum)
 Medication Instructions:  INCREASE CRESTOR  40MG  *If you need a refill on your cardiac medications before your next appointment, please call your pharmacy*  Lab Work: NONE  Testing/Procedures: Echocardiogram - Your physician has requested that you have an echocardiogram. Echocardiography is a painless test that uses sound waves to create images of your heart. It provides your doctor with information about the size and shape of your heart and how well your heart's chambers and valves are working. This procedure takes approximately one hour. There are no restrictions for this procedure.    REFERRAL TO PULMONARY-SOMEONE WILL BE CALLING TO SCHEDULE APPOINTMENT  Follow-Up: At [facility], you and your health needs are our priority.  As part of our continuing mission to provide you with exceptional heart care, we have created designated Provider Care Teams.  These Care Teams include your primary Cardiologist (physician) and Advanced Practice Providers (APPs -  Physician Assistants and Nurse Practitioners) who all work together to provide you with the care you need, when you need it.  We recommend signing up for the patient portal called MyChart.  Sign up information is provided on this After Visit Summary.  MyChart is used to connect with patients for Virtual Visits (Telemedicine).  Patients are able to view lab/test results, encounter notes, upcoming appointments, etc.  Non-urgent messages can be sent to your provider as well.   To learn more about what you can do with MyChart, go to [email or web address].    Your next appointment:   4-6 week(s)  Provider:   Jon Hails, PA-C      Other Instructions

## 2023-05-21 NOTE — Telephone Encounter (Signed)
 The pt has continued itching and burning of the rectum due to anal fissure.  She would like a refill of the diltiazem gel that [place] prescribed.  The pt has been advised that she should have a refill on her diltiazem gel. She will have the pharmacy contact us  if needed.  She has also been advised to make an appt for follow up but she wishes to call back to set that up.

## 2023-05-25 ENCOUNTER — Ambulatory Visit: Payer: Medicare Other | Admitting: Nurse Practitioner

## 2023-05-25 ENCOUNTER — Other Ambulatory Visit (INDEPENDENT_AMBULATORY_CARE_PROVIDER_SITE_OTHER): Payer: Medicare Other

## 2023-05-25 ENCOUNTER — Encounter: Payer: Self-pay | Admitting: Nurse Practitioner

## 2023-05-25 VITALS — BP 130/80 | HR 64 | Ht 68.0 in | Wt 176.0 lb

## 2023-05-25 DIAGNOSIS — R194 Change in bowel habit: Secondary | ICD-10-CM

## 2023-05-25 DIAGNOSIS — K644 Residual hemorrhoidal skin tags: Secondary | ICD-10-CM | POA: Diagnosis not present

## 2023-05-25 DIAGNOSIS — K7689 Other specified diseases of liver: Secondary | ICD-10-CM

## 2023-05-25 DIAGNOSIS — K5730 Diverticulosis of large intestine without perforation or abscess without bleeding: Secondary | ICD-10-CM | POA: Diagnosis not present

## 2023-05-25 DIAGNOSIS — R1013 Epigastric pain: Secondary | ICD-10-CM

## 2023-05-25 DIAGNOSIS — K625 Hemorrhage of anus and rectum: Secondary | ICD-10-CM

## 2023-05-25 DIAGNOSIS — K5900 Constipation, unspecified: Secondary | ICD-10-CM | POA: Diagnosis not present

## 2023-05-25 DIAGNOSIS — K219 Gastro-esophageal reflux disease without esophagitis: Secondary | ICD-10-CM | POA: Diagnosis not present

## 2023-05-25 DIAGNOSIS — Z8619 Personal history of other infectious and parasitic diseases: Secondary | ICD-10-CM | POA: Diagnosis not present

## 2023-05-25 DIAGNOSIS — K649 Unspecified hemorrhoids: Secondary | ICD-10-CM | POA: Diagnosis not present

## 2023-05-25 LAB — CBC WITH DIFFERENTIAL/PLATELET
Basophils Absolute: 0.1 10*3/uL (ref 0.0–0.1)
Basophils Relative: 1.1 % (ref 0.0–3.0)
Eosinophils Absolute: 0.1 10*3/uL (ref 0.0–0.7)
Eosinophils Relative: 1.6 % (ref 0.0–5.0)
HCT: 46.2 % — ABNORMAL HIGH (ref 36.0–46.0)
Hemoglobin: 15.3 g/dL — ABNORMAL HIGH (ref 12.0–15.0)
Lymphocytes Relative: 48.7 % — ABNORMAL HIGH (ref 12.0–46.0)
Lymphs Abs: 2.9 10*3/uL (ref 0.7–4.0)
MCHC: 33.2 g/dL (ref 30.0–36.0)
MCV: 96.3 fL (ref 78.0–100.0)
Monocytes Absolute: 0.5 10*3/uL (ref 0.1–1.0)
Monocytes Relative: 8.8 % (ref 3.0–12.0)
Neutro Abs: 2.4 10*3/uL (ref 1.4–7.7)
Neutrophils Relative %: 39.8 % — ABNORMAL LOW (ref 43.0–77.0)
Platelets: 315 10*3/uL (ref 150.0–400.0)
RBC: 4.79 Mil/uL (ref 3.87–5.11)
RDW: 14.1 % (ref 11.5–15.5)
WBC: 6 10*3/uL (ref 4.0–10.5)

## 2023-05-25 LAB — COMPREHENSIVE METABOLIC PANEL
ALT: 8 U/L (ref 0–35)
AST: 13 U/L (ref 0–37)
Albumin: 4.5 g/dL (ref 3.5–5.2)
Alkaline Phosphatase: 84 U/L (ref 39–117)
BUN: 11 mg/dL (ref 6–23)
CO2: 28 meq/L (ref 19–32)
Calcium: 9.7 mg/dL (ref 8.4–10.5)
Chloride: 102 meq/L (ref 96–112)
Creatinine, Ser: 1.01 mg/dL (ref 0.40–1.20)
GFR: 53.99 mL/min — ABNORMAL LOW (ref >60.00)
Glucose, Bld: 124 mg/dL — ABNORMAL HIGH (ref 70–99)
Potassium: 3.8 meq/L (ref 3.5–5.1)
Sodium: 139 meq/L (ref 135–145)
Total Bilirubin: 0.4 mg/dL (ref 0.2–1.2)
Total Protein: 8 g/dL (ref 6.0–8.3)

## 2023-05-25 LAB — C-REACTIVE PROTEIN: CRP: <1 mg/dL (ref 0.5–20.0)

## 2023-05-25 LAB — SEDIMENTATION RATE: Sed Rate: 63 mm/h — ABNORMAL HIGH (ref 0–30)

## 2023-05-25 MED ORDER — NA SULFATE-K SULFATE-MG SULF 17.5-3.13-1.6 GM/177ML PO SOLN: 1.0000 | ORAL | 0 refills | Status: DC

## 2023-05-25 NOTE — Patient Instructions (Addendum)
 You have been scheduled for an abdominal ultrasound at [facility] (1st floor of hospital) on 06/01/23 at 9:30 am. Please arrive 30 minutes prior to your appointment for registration. Make certain not to have anything to eat or drink 6 hours prior to your appointment. Should you need to reschedule your appointment, please contact radiology at [phone]. This test typically takes about 30 minutes to perform.  Your provider has requested that you go to the basement level for lab work before leaving today. Press B on the elevator. The lab is located at the first door on the left as you exit the elevator.  We have sent the following medications to your pharmacy for you to pick up at your convenience: Suprep   Please purchase the following medications over the counter and take as directed:  Benefiber - 1 tablespoon once daily.   Desitin- apply small amount inside the anal opening and external anal area three times daily as needed.   Fdgard - 1 capsule by mouth twice daily as needed for nausea and abdomen pain.   Reduce your Carafate  to twice daily.   You have been scheduled for a colonoscopy. Please follow written instructions given to you at your visit today.   Please pick up your prep supplies at the pharmacy within the next 1-3 days.  If you use inhalers (even only as needed), please bring them with you on the day of your procedure.  DO NOT TAKE 7 DAYS PRIOR TO TEST- Trulicity (dulaglutide) Ozempic, Wegovy (semaglutide) Mounjaro (tirzepatide) Bydureon Bcise (exanatide extended release)  DO NOT TAKE 1 DAY PRIOR TO YOUR TEST Rybelsus (semaglutide) Adlyxin (lixisenatide) Victoza (liraglutide) Byetta (exanatide) ___________________________________________________________________________  Due to recent changes in healthcare laws, you may see the results of your imaging and laboratory studies on MyChart before your provider has had a chance to review them.  We understand  that in some cases there may be results that are confusing or concerning to you. Not all laboratory results come back in the same time frame and the provider may be waiting for multiple results in order to interpret others.  Please give us  48 hours in order for your provider to thoroughly review all the results before contacting the office for clarification of your results.   Thank you for choosing me and [facility].  Elida Shawl, CRNP

## 2023-05-25 NOTE — Progress Notes (Signed)
 05/25/2023 Dwayne Prescott Christians [id number] 11/28/1945   CHIEF COMPLAINT: Upper abdominal pain, rectal bleeding   HISTORY OF PRESENT ILLNESS: Zillah L. Brunelli is a 78 year old female with a past medical history of arthritis, hypertension, hyperlipidemia, LBBB, DM type II, migraine headaches, vertigo, GERD, H. Pylori gastritis, gastric focal intestinal metaplasia without dysplasia, diverticulosis with suspected SCAD on Lialda .   She is known by Dr. San. Last seen in office by Harlene Mail PA-C on 02/18/2023 due to having nausea, decreased appetite, abdominal  bloat and constipation. At that time, an abdominal xray was done which showed a moderate amount of stool burden in the right colon and she was prescribed a Miralax  purge then Miralax  Q HS to follow and Ibgard. She developed LLQ pain and presented to the ED 04/01/2023. CTAP with contrast showed diverticulosis without evidence of diverticulitis.   She presents today quite concerned about having persistent upper abdominal pain for the past years. Some days, her upper abdominal pain is so severe that she sits bent forward all day. Some days, this pain eases up. She takes a few bites of food or sips of water which triggers her upper abdominal pain. She awakens every morning with epigastric pain and often awakens in the middle of the night with this pain. She has intermittent nausea and vomits nonbloody bilious emesis. Vomites twice weekly. No coffee ground emesis or frank hematemesis. She endorses losing 10 - 14 lbs over the past 3 to 4 months. Decreased appetite, doesn't feel hungry. She has an altered bowel pattern and sees a small to moderate amount of bright red blood with the stool once every 1 to 3 weeks. She has intermittent anal discomfort and irritation. Her bowel pattern varies, passes small stools in one hour. Stools are soft, hard and sometimes has diarrhea. She remains on Mesalamine  4.8 gm o every day for suspected SCAD. She rarely  takes Goody powder for headaches. On ASA 81mg  every day. Her most recent EGD was 07/09/2022 which showed a vascular bleb in the distal esophagus, a non-obstructing Shatzki's ring and peptic duodenitis without evidence of H. Pylori. She stated having the same central upper abdominal pain at the time of this EGD. Prior EGD 02/03/2022 showed similar findings with a single bleeding duodenal AVM which was cauterized and clip was placed. Gastric biopsies were positive for H. Pylori and focal intestinal metaplasia. Treated with Metronidazole /Doxycline/Pepto and Omeprazole x 14 days. Currently, she remains on Pantoprazole  40mg  po bid and Carafate  1gm po qid. Her most recent colonoscopy was 04/26/2020 which showed significant diverticular changes with associated erythema and spasticity without polyps. CTAP 04/01/2023 showed a normal gallbladder without biliary ductal dilatation, a normal pancreas and sigmoid diverticulosis without diverticulitis.       Latest Ref Rng & Units 04/01/2023   10:43 PM 02/12/2023   10:03 AM 12/09/2022    2:37 AM  CBC  WBC 4.0 - 10.5 K/uL 7.5  6.2  7.5   Hemoglobin 12.0 - 15.0 g/dL 84.6  85.3  87.5   Hematocrit 36.0 - 46.0 % 44.0  44.6  37.1   Platelets 150 - 400 K/uL 234  302.0  197        Latest Ref Rng & Units 04/01/2023   10:43 PM 02/12/2023   10:03 AM 12/09/2022    2:37 AM  CMP  Glucose 70 - 99 mg/dL 864  892  879   BUN 8 - 23 mg/dL 8  9  <5   Creatinine  0.44 - 1.00 mg/dL 9.13  9.15  9.06   Sodium 135 - 145 mmol/L 137  143  142   Potassium 3.5 - 5.1 mmol/L 3.6  3.1  3.3   Chloride 98 - 111 mmol/L 102  102  104   CO2 22 - 32 mmol/L 26  31  28    Calcium  8.9 - 10.3 mg/dL 9.8  9.7  8.8   Total Protein 6.5 - 8.1 g/dL 8.0  8.1    Total Bilirubin <1.2 mg/dL 0.7  0.5    Alkaline Phos 38 - 126 U/L 75  75    AST 15 - 41 U/L 11  17    ALT 0 - 44 U/L 8  10     Lipase 30  CTAP with contrast 04/01/2023:  FINDINGS: Lower chest: Lung bases are clear.   Hepatobiliary: 2.2 cm  central hepatic cyst (series 2/image 13), benign.   Gallbladder is unremarkable. No intrahepatic or extrahepatic dilatation.   Pancreas: Within normal limits.   Spleen: Within normal limits but   Adrenals/Urinary Tract: Adrenal glands are within normal limits.   Kidneys are within normal limits.  No hydronephrosis.   Bladder is within normal limits.   Stomach/Bowel: Stomach is within normal limits.   No evidence of bowel obstruction.   Normal appendix (series 2/image 86).   Sigmoid diverticulosis, without evidence of diverticulitis.   Vascular/Lymphatic: No evidence of abdominal aortic aneurysm.   Atherosclerotic calcifications of the abdominal aorta and branch vessels, although vessels remain patent.   No suspicious abdominopelvic lymphadenopathy.   Reproductive: Status post hysterectomy.   No adnexal masses.   Other: No abdominopelvic ascites.   Musculoskeletal: Status post PLIF at L3-5. Mild degenerative changes of the visualized thoracolumbar spine.   IMPRESSION: Sigmoid diverticulosis, without evidence of diverticulitis.   No CT findings to account for the patient's left lower quadrant abdominal pain.  PAST GI PROCEDURES:   EGD 07/09/2022: - Vascular bleb found in the distal esophagus. - Non-obstructing Schatzki ring. Fractured with forceps. - Normal stomach. Biopsied.  - Erythematous duodenopathy. Biopsied.  - Normal second portion of the duodenum.1. Surgical [P], duodenal REACTIVE DUODENAL MUCOSA WITH FOCAL GASTRIC METAPLASIA COMPATIBLE WITH PEPTIC DUODENITIS 2. Surgical [P], gastric antrum REACTIVE GASTROPATHY WITH MILD CHRONIC GASTRITIS NEGATIVE FOR H. PYLORI, INTESTINAL METAPLASIA, DYSPLASIA AND CARCINOMA 3. Surgical [P], gastric body MINIMAL CHRONIC GASTRITIS WITH LYMPHOID AGGREGATE NEGATIVE FOR H. PYLORI, INTESTINAL METAPLASIA, DYSPLASIA AND CARCINOMA  EGD 02/03/2022: - Normal upper third of esophagus and middle third of esophagus.  - Bleb  found in the esophagus.  - Non-obstructing Schatzki ring. Given location in close proximity to the esophageal bleb, I elected to treat with cold forceps for ring fracturing.  - Mild, non-ulcer gastritis. Biopsied.  - A single bleeding angioectasia in the duodenum. Clips (MR conditional) were placed and cauterized with the tip of the snare.  - Normal mucosa was found in the duodenal bulb, in the first portion of the duodenum and in the second portion of the duodenum.  Surgical [P], gastric body - CHRONIC ACTIVE GASTRITIS - POSITIVE FOR H. PYLORI ON H AND E STAIN - FOCAL INTESTINAL METAPLASIA - NEGATIVE FOR MALIGNANCY  EGD 04/26/2020: - Mild, non-specific distal gastritis, biopsied to check for H. pylori.  - Small 'blue bleb' vascular finding in the distal esophagus of doubtful clinical importance.  - The examination was otherwise normal. - GASTRIC ANTRAL AND OXYNTIC MUCOSA WITH MILD CHRONIC GASTRITIS - FOCAL INTESTINAL METAPLASIA OF ANTRAL MUCOSA, NEGATIVE FOR DYSPLASIA -  WARTHIN STARRY STAIN IS NEGATIVE FOR HELICOBACTER PYLORI  Colonoscopy 04/26/2020: - Signficant chronic diverticular changes (luminal narrowing, erythema and spasticity). I suspect this is the cause of her abdominal pains.  - The examination was otherwise normal on direct and retroflexion views.  - No polyps or cancers  Colonoscopy 2018: - Sigmoid diverticulosis    Past Medical History:  Diagnosis Date   Arthritis    back, knees, shoulder, hands , injections in pelvis, for bone spurs (05/04/2016)   Chronic back pain    Diverticulosis    Dysrhythmia    LBBB   GERD (gastroesophageal reflux disease)    History of kidney stones    HLD (hyperlipidemia)    Hypertension    Migraine    hx (05/04/2016)   Peripheral neuropathy    Radiculopathy    Sinus complaint    Sinus headache    occasional (05/04/2016)   Type II diabetes mellitus (HCC)    diet controlled, no meds now, was on insulin  & po treatment at one time      Vertigo    Past Surgical History:  Procedure Laterality Date   ANTERIOR CERVICAL DECOMP/DISCECTOMY FUSION N/A 04/17/2016   Procedure: ANTERIOR CERVICAL DECOMPRESSION/DISCECTOMY FUSION CERVICAL THREE - CERVICAL FOUR, POSTERIOR CERVICAL DISCECTOMY CERVICAL SEVEN -THORASIC ONE LEFT;  Surgeon: Rockey Peru, MD;  Location: [facility] OR;  Service: Neurosurgery;  Laterality: N/A;  Anterior/Posterior   ANTERIOR FUSION CERVICAL SPINE  2000   Dr. Gaither; for bone spurs; put hardware in too   BACK SURGERY N/A 12/08/2022   COLONOSCOPY  06/10/2016   Gabem   COLONOSCOPY  2023   LASIK Bilateral    LEFT HEART CATH AND CORONARY ANGIOGRAPHY N/A 03/17/2018   Procedure: LEFT HEART CATH AND CORONARY ANGIOGRAPHY;  Surgeon: Court Dorn PARAS, MD;  Location: [facility] INVASIVE CV LAB;  Service: Cardiovascular;  Laterality: N/A;   LUMBAR LAMINECTOMY Left 06/30/2013   Procedure: Left L3-4 Extraforaminal approach to excise far lateral herniated nucleus pulposus;  Surgeon: Lynwood FORBES Better, MD;  Location: [facility] OR;  Service: Orthopedics;  Laterality: Left;   TUBAL LIGATION     VAGINAL HYSTERECTOMY      Allergies  Allergen Reactions   Symbicort  [Budesonide -Formoterol  Fumarate] Shortness Of Breath, Swelling and Other (See Comments)    Pharyngeal edema, hoarseness, tightness in chest, lips became dark.   Acetaminophen  Hives and Nausea Only   Azithromycin Hives and Nausea And Vomiting   Lipitor [Atorvastatin] Nausea Only    weakness   Pregabalin  Nausea And Vomiting    dizziness   Zyrtec [Cetirizine] Hives     Outpatient Encounter Medications as of 05/25/2023  Medication Sig   ABRYSVO 120 MCG/0.5ML injection    amLODipine  (NORVASC ) 5 MG tablet TAKE 1 TABLET(5 MG) BY MOUTH DAILY   aspirin  81 MG EC tablet Take 81 mg by mouth daily. Swallow whole.   Aspirin -Acetaminophen -Caffeine (GOODY HEADACHE PO) Take 1 packet by mouth daily as needed (headaches). (Patient not taking: Reported on 05/20/2023)   Blood Glucose Monitoring Suppl DEVI  1 each by Does not apply route in the morning, at noon, and at bedtime. May substitute to any manufacturer covered by patient's insurance.   Calcium  Carb-Cholecalciferol  (CALCIUM  500 + D PO) Take 1 tablet by mouth daily.   clobetasol  ointment (TEMOVATE ) 0.05 % Apply 1 Application topically 2 (two) times daily. (Patient taking differently: Apply 1 Application topically 2 (two) times daily as needed (rash).)   codeine  15 MG tablet Take 1 tablet (15 mg total) by mouth every 6 (  six) hours as needed for up to 5 days.   ferrous sulfate  325 (65 FE) MG EC tablet Take 325 mg by mouth daily.   Glucose Blood (BLOOD GLUCOSE TEST STRIPS) STRP 1 each by In Vitro route in the morning, at noon, and at bedtime. May substitute to any manufacturer covered by patient's insurance.   guaiFENesin (MUCINEX) 600 MG 12 hr tablet Take by mouth 2 (two) times daily.   Lancets Misc. MISC 1 each by Does not apply route in the morning, at noon, and at bedtime. May substitute to any manufacturer covered by patient's insurance.   meclizine  (ANTIVERT ) 25 MG tablet Take 1 tablet (25 mg total) by mouth 3 (three) times daily as needed for dizziness.   mesalamine  (LIALDA ) 1.2 g EC tablet TAKE 4 TABLETS(4.8 GRAMS) BY MOUTH DAILY WITH BREAKFAST   methylPREDNISolone  (MEDROL  DOSEPAK) 4 MG TBPK tablet Take as prescribed on the box (Patient not taking: Reported on 05/20/2023)   Multiple Vitamins-Minerals (CENTRUM ADULTS PO) Take 1 tablet by mouth daily.    NON FORMULARY Diltiazem 2%/Lidocaine5% compound Use 3 x rectally daily for 2 months to heal anal fissure   olmesartan  (BENICAR ) 5 MG tablet TAKE 2 TABLETS BY MOUTH DAILY   Omega-3 Fatty Acids (FISH OIL PO) Take 700 mg by mouth daily.   pantoprazole  (PROTONIX ) 40 MG tablet Take 1 tablet (40 mg total) by mouth 2 (two) times daily before a meal.   potassium chloride  SA (KLOR-CON  M) 20 MEQ tablet TAKE 1 TABLET(20 MEQ) BY MOUTH DAILY (Patient taking differently: Take 40 mEq by mouth daily.)    PREDNISOLON-MOXIFLOX-BROMFENAC OP Place 1 drop into both eyes daily as needed (irritation).   PREVNAR 20 0.5 ML injection    rosuvastatin  (CRESTOR ) 40 MG tablet Take 1 tablet (40 mg total) by mouth daily.   sucralfate  (CARAFATE ) 1 g tablet TAKE 1 TABLET(1 GRAM) BY MOUTH FOUR TIMES DAILY- WITH MEALS AND AT BEDTIME   triamcinolone  cream (KENALOG ) 0.5 % Apply topically 2 (two) times daily.   vitamin B-12 (CYANOCOBALAMIN ) 1000 MCG tablet Take 1,000 mcg by mouth daily.   Facility-Administered Encounter Medications as of 05/25/2023  Medication   diclofenac  Sodium (VOLTAREN ) 1 % topical gel 2 g   REVIEW OF SYSTEMS:  Gen: + Weight loss. No fevers or night sweats.   CV: Denies chest pain, palpitations or edema. Resp: Denies cough, shortness of breath of hemoptysis.  GI: See HPI. GU: Denies urinary burning, blood in urine, increased urinary frequency or incontinence. MS: Denies joint pain, muscles aches or weakness. Derm: Denies rash, itchiness, skin lesions or unhealing ulcers. Psych: Denies depression, anxiety, memory loss or confusion. Heme: Denies bruising, easy bleeding. Neuro:  Denies headaches, dizziness or paresthesias. Endo:  Denies any problems with DM, thyroid  or adrenal function.  PHYSICAL EXAM: BP (!) 150/82   Pulse 64   Ht 5' 8 (1.727 m)   Wt 176 lb (79.8 kg)   BMI 26.76 kg/m    Wt Readings from Last 3 Encounters:  05/25/23 176 lb (79.8 kg)  05/20/23 164 lb (74.4 kg)  04/21/23 169 lb 6.4 oz (76.8 kg)   General: 78 year old female in no acute distress. Head: Normocephalic and atraumatic. Eyes:  Sclerae non-icteric, conjunctive pink. Ears: Normal auditory acuity. Mouth: Dentition intact. No ulcers or lesions.  Neck: Supple, no lymphadenopathy or thyromegaly.  Lungs: Clear bilaterally to auscultation without wheezes, crackles or rhonchi. Heart: Regular rate and rhythm. No murmur, rub or gallop appreciated.  Abdomen: Soft, nondistended. Mild epigastric  tenderness without  rebound or guarding. No masses. No hepatosplenomegaly. Normoactive bowel sounds x 4 quadrants.  Rectal: Small non-inflamed anterior external hemorrhoids, internal hemorrhoids palpated without prolapse. No blood or stool in the rectal vault. No palpable mass within reach. Rovanda CMA present during exam.  Musculoskeletal: Symmetrical with no gross deformities. Skin: Warm and dry. No rash or lesions on visible extremities. Extremities: No edema. Neurological: Alert oriented x 4, no focal deficits.  Psychological:  Alert and cooperative. Normal mood and affect.  ASSESSMENT AND PLAN:  78 year old female with chronic epigastric pain, nausea and vomiting. CTAP showed a 2.2 cm liver cyst, normal gallbladder without biliary ductal dilatation and a normal pancreas.  -CBC, CMP, CRP and Sed rate  -RUQ sonogram to evaluate the gallbladder  -Check CCK HIDA if RUQ sono unrevealing  -Fdgard one capsule po bid  GERD, with history of H. Pylori gastritis per EGD 01/2022 treated resulting in H. Pylori eradication per follow up EGD 06/2022. -Consider repeat EGD 06/2023 if gallbladder evaluation unrevealing  -Reduce Carafate  to 1 gm po bid -Continue Pantoprazole  40mg  po bid   Rectal bleeding. Rectal exam today showed non-inflamed external hemorrhoid and internal hemorrhoids without prolapse or active bleeding. Colonoscopy 04/2020 showed significant diverticular changes, no polyps.  -Diagnostic colonoscopy benefits and risks discussed including risk with sedation, risk of bleeding, perforation and infection. Colonoscopy tentatively scheduled, to review case with Dr. San to further verify if a colonoscopy warranted at this time. Patient potentially considered higher risk for perforation secondary to diverticular disease.  Apply a small amount of Desitin inside the anal opening and to the external anal area three times daily as needed for anal or hemorrhoidal irritation/bleeding.   Diverticular disease with  suspected segmental colitis associated with diverticulitis (SCAD). On Mesalamine  4.8 gm po every day.   Variable bowel pattern  -Benefiber one tablespoon daily       CC:  Mercer Clotilda SAUNDERS, MD

## 2023-05-27 NOTE — Progress Notes (Signed)
 Agree with the assessment and plan as outlined by Elida Shawl, NP.   I think it is reasonable to proceed with colonoscopy to reevaluate the area of segmental colitis now that she has been on 5-ASA therapy for some time.  If no active inflammation, would be less convincing that this is source of ongoing GI symptoms.  Not sure that a repeat upper endoscopy right now is warranted given recency of last endoscopy.  Instead agree with imaging and medication trial as you outlined.  Symptoms do not sound vascular in etiology, and recent CT was otherwise normal.  Does not sound like any concomitant dermatologic symptoms, pruritus, etc.  Sandor Flatter, DO, [facility]

## 2023-05-28 ENCOUNTER — Telehealth: Payer: Self-pay

## 2023-05-28 NOTE — Telephone Encounter (Signed)
 Spoke with patient regarding NP/MD recommendations. She's been advised on upcoming appointment dates/times for the [place] and colonoscopy. She had no further questions & verbalized all understanding.

## 2023-05-28 NOTE — Progress Notes (Signed)
 Contacted patient and patient verbalized understanding of lab results.

## 2023-05-28 NOTE — Progress Notes (Signed)
 Kaira, pls contact patient and let her know I discussed her office visit and ongoing symptoms with Dr. San and he agreed with plan to proceed with a colonoscopy. Hold off on EGD for now. Proceed with abdominal sonogram as ordered, if negative, will order CCK HIDA to further evaluate the gallbladder. THX.

## 2023-05-28 NOTE — Telephone Encounter (Signed)
 Author: Cara Elida HERO, NP Service: Gastroenterology Author Type: Nurse Practitioner  Filed: 05/28/2023  5:37 AM Encounter Date: 05/25/2023 Status: Signed  Editor: Cara Elida HERO, NP (Nurse Practitioner)   Nyla, pls contact patient and let her know I discussed her office visit and ongoing symptoms with Dr. San and he agreed with plan to proceed with a colonoscopy. Hold off on EGD for now. Proceed with abdominal sonogram as ordered, if negative, will order CCK HIDA to further evaluate the gallbladder. THX.

## 2023-05-31 ENCOUNTER — Other Ambulatory Visit: Payer: Self-pay | Admitting: Family Medicine

## 2023-05-31 DIAGNOSIS — Z1231 Encounter for screening mammogram for malignant neoplasm of breast: Secondary | ICD-10-CM

## 2023-06-01 ENCOUNTER — Ambulatory Visit (HOSPITAL_COMMUNITY)
Admission: RE | Admit: 2023-06-01 | Discharge: 2023-06-01 | Disposition: A | Discharge status: Home / Self Care | Payer: Medicare Other | Source: Ambulatory Visit | Attending: Nurse Practitioner | Admitting: Nurse Practitioner

## 2023-06-01 DIAGNOSIS — K7689 Other specified diseases of liver: Secondary | ICD-10-CM | POA: Diagnosis not present

## 2023-06-01 DIAGNOSIS — K625 Hemorrhage of anus and rectum: Secondary | ICD-10-CM | POA: Diagnosis not present

## 2023-06-01 DIAGNOSIS — K5900 Constipation, unspecified: Secondary | ICD-10-CM | POA: Diagnosis not present

## 2023-06-01 DIAGNOSIS — R1013 Epigastric pain: Secondary | ICD-10-CM | POA: Diagnosis not present

## 2023-06-03 ENCOUNTER — Other Ambulatory Visit (INDEPENDENT_AMBULATORY_CARE_PROVIDER_SITE_OTHER): Payer: Medicare Other

## 2023-06-03 ENCOUNTER — Ambulatory Visit: Payer: Medicare Other | Admitting: Orthopedic Surgery

## 2023-06-03 DIAGNOSIS — Z981 Arthrodesis status: Secondary | ICD-10-CM

## 2023-06-03 MED ORDER — MORPHINE SULFATE 15 MG PO TABS: 7.5000 mg | ORAL_TABLET | ORAL | 0 refills | Status: AC | PRN

## 2023-06-03 NOTE — Progress Notes (Signed)
Orthopedic Surgery Post-operative Office Visit   Procedure: L3-5 laminectomy and PSIF Date of Surgery: 12/08/2022 (~6 months post-op)   Assessment: Patient is a 78 y.o. who is doing well after surgery     Plan: -Operative plans complete -No spine specific precautions at this time -Prescribe morphine 7.5mg  to be used sparingly -Return to office in 6 months, x-rays needed at next visit: AP/lateral/flex/ex lumbar ___________________________________________________________________________     Subjective: Patient has been doing well since she was last seen.  Right now, she is not having any back pain.  She is having some knee pain.  No radiating leg pain.  She has some days where back pain is more significant and she uses codeine for that.  However, she does not use that frequently.  Denies paresthesias and numbness.  Has not noticed any redness or drainage from her incision.     Objective:   General: no acute distress, appropriate affect Neurologic: alert, answering questions appropriately, following commands Respiratory: unlabored breathing on room air Skin: incision is well healed   MSK (spine):   -Strength exam                                                   Left                  Right   EHL                              5/5                  5/5 TA                                 5/5                  5/5 GSC                             5/5                  5/5 Knee extension            5/5                  5/5 Hip flexion                    5/5                  5/5   -Sensory exam                           Sensation intact to light touch in L3-S1 nerve distributions of bilateral lower extremities     Imaging: X-rays of the lumbar spine taken 06/03/2023 were independently reviewed and interpreted, showing posterior instrumentation from L3-L5.  No lucency seen around the screws.  No the screws backed out.  No translation seen or evidence of instability on flexion/extension  views.  Laminectomy defect from L3-5 seen.  No fracture or dislocation noted.     Patient name: Angela Pugh Patient MRN: [id number] Date of visit: 06/03/23

## 2023-06-12 ENCOUNTER — Other Ambulatory Visit: Payer: Self-pay | Admitting: Family Medicine

## 2023-06-14 DIAGNOSIS — H00023 Hordeolum internum right eye, unspecified eyelid: Secondary | ICD-10-CM | POA: Diagnosis not present

## 2023-06-17 ENCOUNTER — Ambulatory Visit (HOSPITAL_COMMUNITY)
Admission: RE | Admit: 2023-06-17 | Discharge: 2023-06-17 | Disposition: A | Discharge status: Home / Self Care | Payer: Medicare Other | Source: Ambulatory Visit | Attending: Cardiology | Admitting: Cardiology

## 2023-06-17 ENCOUNTER — Telehealth: Payer: Self-pay | Admitting: Cardiovascular Disease

## 2023-06-17 ENCOUNTER — Telehealth: Payer: Self-pay

## 2023-06-17 DIAGNOSIS — R072 Precordial pain: Secondary | ICD-10-CM | POA: Diagnosis not present

## 2023-06-17 DIAGNOSIS — R0609 Other forms of dyspnea: Secondary | ICD-10-CM | POA: Diagnosis not present

## 2023-06-17 LAB — ECHOCARDIOGRAM COMPLETE
AR max vel: 1.47 cm2
AV Area VTI: 1.49 cm2
AV Area mean vel: 1.42 cm2
AV Mean grad: 6.3 mm[Hg]
AV Peak grad: 15.4 mm[Hg]
Ao pk vel: 1.96 m/s
Area-P 1/2: 1.43 cm2
Calc EF: 46.4 %
S' Lateral: 3.81 cm
Single Plane A2C EF: 46.9 %
Single Plane A4C EF: 45.7 %

## 2023-06-17 NOTE — Telephone Encounter (Signed)
Angela Pugh, [place] 06/17/2023  3:24 PM EST     Your cardiac ultrasound shows a strong squeeze function and good valve function. You have some stiffness to your heart muscle and we can continue to manage this with medications. This could be contributing to your shortness of breath with exertion. Your also have a mild dilation of your ascending aorta that we can monitor in 6 months. We can discuss more when I see you next month.    Patient identification verified by 2 forms. Marilynn Rail, RN    Called and spoke to patient  Relayed result message  Advised to keep 2/21 OV at 8am for follow up  Patient verbalized understanding, no questions at this time

## 2023-06-17 NOTE — Telephone Encounter (Signed)
Lmom to discuss echo results with pt. Waiting on a return call.

## 2023-06-17 NOTE — Telephone Encounter (Signed)
Patient states she was returning call. Please advise  ?

## 2023-06-20 ENCOUNTER — Emergency Department (HOSPITAL_BASED_OUTPATIENT_CLINIC_OR_DEPARTMENT_OTHER): Payer: Medicare Other

## 2023-06-20 ENCOUNTER — Other Ambulatory Visit: Payer: Self-pay

## 2023-06-20 ENCOUNTER — Encounter (HOSPITAL_BASED_OUTPATIENT_CLINIC_OR_DEPARTMENT_OTHER): Payer: Self-pay | Admitting: Emergency Medicine

## 2023-06-20 ENCOUNTER — Emergency Department (HOSPITAL_BASED_OUTPATIENT_CLINIC_OR_DEPARTMENT_OTHER)
Admission: EM | Admit: 2023-06-20 | Discharge: 2023-06-20 | Disposition: A | Discharge status: Home / Self Care | Payer: Medicare Other | Attending: Emergency Medicine | Admitting: Emergency Medicine

## 2023-06-20 DIAGNOSIS — I6529 Occlusion and stenosis of unspecified carotid artery: Secondary | ICD-10-CM | POA: Diagnosis not present

## 2023-06-20 DIAGNOSIS — I700 Atherosclerosis of aorta: Secondary | ICD-10-CM | POA: Diagnosis not present

## 2023-06-20 DIAGNOSIS — Z981 Arthrodesis status: Secondary | ICD-10-CM | POA: Insufficient documentation

## 2023-06-20 DIAGNOSIS — M47813 Spondylosis without myelopathy or radiculopathy, cervicothoracic region: Secondary | ICD-10-CM | POA: Diagnosis not present

## 2023-06-20 DIAGNOSIS — I10 Essential (primary) hypertension: Secondary | ICD-10-CM | POA: Diagnosis not present

## 2023-06-20 DIAGNOSIS — M436 Torticollis: Secondary | ICD-10-CM | POA: Diagnosis not present

## 2023-06-20 DIAGNOSIS — E876 Hypokalemia: Secondary | ICD-10-CM | POA: Insufficient documentation

## 2023-06-20 DIAGNOSIS — M542 Cervicalgia: Secondary | ICD-10-CM | POA: Insufficient documentation

## 2023-06-20 DIAGNOSIS — G8929 Other chronic pain: Secondary | ICD-10-CM | POA: Insufficient documentation

## 2023-06-20 DIAGNOSIS — M8508 Fibrous dysplasia (monostotic), other site: Secondary | ICD-10-CM | POA: Diagnosis not present

## 2023-06-20 DIAGNOSIS — K219 Gastro-esophageal reflux disease without esophagitis: Secondary | ICD-10-CM | POA: Insufficient documentation

## 2023-06-20 DIAGNOSIS — M4802 Spinal stenosis, cervical region: Secondary | ICD-10-CM | POA: Diagnosis not present

## 2023-06-20 DIAGNOSIS — E119 Type 2 diabetes mellitus without complications: Secondary | ICD-10-CM | POA: Insufficient documentation

## 2023-06-20 DIAGNOSIS — Z79899 Other long term (current) drug therapy: Secondary | ICD-10-CM | POA: Insufficient documentation

## 2023-06-20 DIAGNOSIS — M4319 Spondylolisthesis, multiple sites in spine: Secondary | ICD-10-CM | POA: Diagnosis not present

## 2023-06-20 DIAGNOSIS — Z20822 Contact with and (suspected) exposure to covid-19: Secondary | ICD-10-CM | POA: Insufficient documentation

## 2023-06-20 DIAGNOSIS — M47812 Spondylosis without myelopathy or radiculopathy, cervical region: Secondary | ICD-10-CM | POA: Diagnosis not present

## 2023-06-20 DIAGNOSIS — M549 Dorsalgia, unspecified: Secondary | ICD-10-CM | POA: Insufficient documentation

## 2023-06-20 DIAGNOSIS — J439 Emphysema, unspecified: Secondary | ICD-10-CM | POA: Insufficient documentation

## 2023-06-20 DIAGNOSIS — Z7982 Long term (current) use of aspirin: Secondary | ICD-10-CM | POA: Insufficient documentation

## 2023-06-20 DIAGNOSIS — I2510 Atherosclerotic heart disease of native coronary artery without angina pectoris: Secondary | ICD-10-CM | POA: Insufficient documentation

## 2023-06-20 LAB — CBC WITH DIFFERENTIAL/PLATELET
Abs Immature Granulocytes: 0.02 10*3/uL (ref 0.00–0.07)
Basophils Absolute: 0 10*3/uL (ref 0.0–0.1)
Basophils Relative: 0 %
Eosinophils Absolute: 0 10*3/uL (ref 0.0–0.5)
Eosinophils Relative: 0 %
HCT: 39.9 % (ref 36.0–46.0)
Hemoglobin: 13.7 g/dL (ref 12.0–15.0)
Immature Granulocytes: 0 %
Lymphocytes Relative: 20 %
Lymphs Abs: 1.6 10*3/uL (ref 0.7–4.0)
MCH: 31.4 pg (ref 26.0–34.0)
MCHC: 34.3 g/dL (ref 30.0–36.0)
MCV: 91.3 fL (ref 80.0–100.0)
Monocytes Absolute: 0.7 10*3/uL (ref 0.1–1.0)
Monocytes Relative: 9 %
Neutro Abs: 5.5 10*3/uL (ref 1.7–7.7)
Neutrophils Relative %: 71 %
Platelets: 237 10*3/uL (ref 150–400)
RBC: 4.37 MIL/uL (ref 3.87–5.11)
RDW: 12.7 % (ref 11.5–15.5)
WBC: 7.9 10*3/uL (ref 4.0–10.5)
nRBC: 0 % (ref 0.0–0.2)

## 2023-06-20 LAB — RESP PANEL BY RT-PCR (RSV, FLU A&B, COVID)  RVPGX2
Influenza A by PCR: NEGATIVE
Influenza B by PCR: NEGATIVE
Resp Syncytial Virus by PCR: NEGATIVE
SARS Coronavirus 2 by RT PCR: NEGATIVE

## 2023-06-20 LAB — BASIC METABOLIC PANEL
Anion gap: 10 (ref 5–15)
BUN: 10 mg/dL (ref 8–23)
CO2: 29 mmol/L (ref 22–32)
Calcium: 8.8 mg/dL — ABNORMAL LOW (ref 8.9–10.3)
Chloride: 102 mmol/L (ref 98–111)
Creatinine, Ser: 0.78 mg/dL (ref 0.44–1.00)
GFR, Estimated: >60 mL/min (ref >60)
Glucose, Bld: 135 mg/dL — ABNORMAL HIGH (ref 70–99)
Potassium: 2.9 mmol/L — ABNORMAL LOW (ref 3.5–5.1)
Sodium: 141 mmol/L (ref 135–145)

## 2023-06-20 MED ORDER — DIAZEPAM 5 MG/ML IJ SOLN
5.0000 mg | Freq: Once | INTRAMUSCULAR | Status: AC
  Administered 2023-06-20: 5 mg via INTRAVENOUS
  Filled 2023-06-20: qty 2

## 2023-06-20 MED ORDER — POTASSIUM CHLORIDE CRYS ER 20 MEQ PO TBCR
40.0000 meq | EXTENDED_RELEASE_TABLET | Freq: Once | ORAL | Status: AC
  Administered 2023-06-20: 40 meq via ORAL
  Filled 2023-06-20: qty 2

## 2023-06-20 MED ORDER — METHOCARBAMOL 500 MG PO TABS: 500.0000 mg | ORAL_TABLET | Freq: Once | ORAL | Status: DC

## 2023-06-20 MED ORDER — CYCLOBENZAPRINE HCL 10 MG PO TABS: 10.0000 mg | ORAL_TABLET | Freq: Two times a day (BID) | ORAL | 0 refills | Status: AC | PRN

## 2023-06-20 MED ORDER — IOHEXOL 350 MG/ML SOLN
75.0000 mL | Freq: Once | INTRAVENOUS | Status: AC | PRN
  Administered 2023-06-20: 75 mL via INTRAVENOUS

## 2023-06-20 MED ORDER — DIAZEPAM 5 MG PO TABS: 5.0000 mg | ORAL_TABLET | Freq: Once | ORAL | Status: DC

## 2023-06-20 MED ORDER — CYCLOBENZAPRINE HCL 5 MG PO TABS
5.0000 mg | ORAL_TABLET | Freq: Once | ORAL | Status: AC
  Administered 2023-06-20: 5 mg via ORAL
  Filled 2023-06-20: qty 1

## 2023-06-20 NOTE — ED Triage Notes (Signed)
Back of neck hurting since last night , suddenly. Has increasingly worsened

## 2023-06-20 NOTE — Discharge Instructions (Signed)
You have been evaluated for your symptoms.  Fortunately CT scan today did not show any concerning finding.  Please take muscle relaxant as prescribed as treatment of your symptoms.  Follow-up closely with your doctor for further care.  Please be aware that your potassium level is low so continue to take your potassium supplementation and have it rechecked by your PCP at your earliest convenience.

## 2023-06-20 NOTE — ED Provider Notes (Signed)
[place] EMERGENCY DEPARTMENT AT [facility] Provider Note   CSN: [id number] Arrival date & time: 06/20/23  1458     History  No chief complaint on file.   Angela Pugh is a 78 y.o. female.  The history is provided by the patient and medical records. No language interpreter was used.     78 year old female history of hypertension, arthritis, chronic back pain, diabetes, CAD, GERD, prior cervical spine surgery presenting with complaint of neck pain.  Patient endorsed pain and stiffness about her neck, having some chills, feeling hot and cold, and some sinus congestion that started since yesterday.  Despite massage, hot bath, and over-the-counter medication she noticed no improvement.  No significant headache, no nausea vomiting diarrhea no focal numbness or focal weakness or rash.  No recent sick contact.  Denies any change in her bedding or pillow.  Home Medications Prior to Admission medications   Medication Sig Start Date End Date Taking? Authorizing Provider  ABRYSVO 120 MCG/0.5ML injection  09/09/22   [provider]  amLODipine (NORVASC) 5 MG tablet TAKE 1 TABLET(5 MG) BY MOUTH DAILY 03/30/23   Runell Gess, MD  aspirin 81 MG EC tablet Take 81 mg by mouth daily. Swallow whole.    [provider]  Aspirin-Acetaminophen-Caffeine (GOODY HEADACHE PO) Take 1 packet by mouth daily as needed (headaches).    [provider]  Blood Glucose Monitoring Suppl DEVI 1 each by Does not apply route in the morning, at noon, and at bedtime. May substitute to any manufacturer covered by patient's insurance. 09/02/22   [name], MD  Calcium Carb-Cholecalciferol (CALCIUM 500 + D PO) Take 1 tablet by mouth daily.    [provider]  clobetasol ointment (TEMOVATE) 0.05 % Apply 1 Application topically 2 (two) times daily. Patient taking differently: Apply 1 Application topically 2 (two) times daily as needed (rash). 01/08/22   [name],  MD  ferrous sulfate 325 (65 FE) MG EC tablet Take 325 mg by mouth daily.    [provider]  Glucose Blood (BLOOD GLUCOSE TEST STRIPS) STRP 1 each by In Vitro route in the morning, at noon, and at bedtime. May substitute to any manufacturer covered by patient's insurance. 09/02/22   [name], MD  guaiFENesin (MUCINEX) 600 MG 12 hr tablet Take by mouth 2 (two) times daily.    [provider]  Lancets Misc. MISC 1 each by Does not apply route in the morning, at noon, and at bedtime. May substitute to any manufacturer covered by patient's insurance. 09/02/22   [name], MD  meclizine (ANTIVERT) 25 MG tablet Take 1 tablet (25 mg total) by mouth 3 (three) times daily as needed for dizziness. 04/02/23   Geoffery Lyons, MD  mesalamine (LIALDA) 1.2 g EC tablet TAKE 4 TABLETS(4.8 GRAMS) BY MOUTH DAILY WITH BREAKFAST 05/06/23   Doree Albee, PA-C  methylPREDNISolone (MEDROL DOSEPAK) 4 MG TBPK tablet Take as prescribed on the box 03/03/23   London Sheer, MD  Multiple Vitamins-Minerals (CENTRUM ADULTS PO) Take 1 tablet by mouth daily.     [provider]  Na Sulfate-K Sulfate-Mg Sulf (SUPREP BOWEL PREP KIT) 17.5-3.13-1.6 GM/177ML SOLN Take 1 kit by mouth as directed. For colonoscopy prep 05/25/23   Arnaldo Natal, NP  NON FORMULARY Diltiazem 2%/Lidocaine5% compound Use 3 x rectally daily for 2 months to heal anal fissure 07/30/22   Doree Albee, PA-C  olmesartan (BENICAR) 5 MG tablet TAKE 2  TABLETS BY MOUTH DAILY 04/22/23   [name], MD  Omega-3 Fatty Acids (FISH OIL PO) Take 700 mg by mouth daily.    [provider]  pantoprazole (PROTONIX) 40 MG tablet Take 1 tablet (40 mg total) by mouth 2 (two) times daily before a meal. 07/06/22   Doree Albee, PA-C  potassium chloride SA (KLOR-CON M) 20 MEQ tablet Take 2 tablets (40 mEq total) by mouth daily. 06/16/23   [name], MD  PREDNISOLON-MOXIFLOX-BROMFENAC OP Place 1 drop into  both eyes daily as needed (irritation).    [provider]  PREVNAR 20 0.5 ML injection  09/09/22   [provider]  rosuvastatin (CRESTOR) 40 MG tablet Take 1 tablet (40 mg total) by mouth daily. 05/20/23   [facility], Roe Rutherford, PA  sucralfate (CARAFATE) 1 g tablet TAKE 1 TABLET(1 GRAM) BY MOUTH FOUR TIMES DAILY- WITH MEALS AND AT BEDTIME 04/05/23   Quentin Mulling R, PA-C  triamcinolone cream (KENALOG) 0.5 % Apply topically 2 (two) times daily. 02/12/23   [name], MD  vitamin B-12 (CYANOCOBALAMIN) 1000 MCG tablet Take 1,000 mcg by mouth daily.    [provider]      Allergies    Symbicort [budesonide-formoterol fumarate], Acetaminophen, Azithromycin, Lipitor [atorvastatin], Pregabalin, and Zyrtec [cetirizine]    Review of Systems   Review of Systems  All other systems reviewed and are negative.   Physical Exam Updated Vital Signs BP (!) 159/78 (BP Location: Left Arm)   Pulse 63   Temp 99.5 F (37.5 C) (Oral)   Resp 20   SpO2 99%  Physical Exam Vitals and nursing note reviewed.  Constitutional:      General: She is not in acute distress.    Appearance: She is well-developed.  HENT:     Head: Normocephalic and atraumatic.  Eyes:     Conjunctiva/sclera: Conjunctivae normal.  Neck:     Vascular: No carotid bruit.  Cardiovascular:     Rate and Rhythm: Normal rate and regular rhythm.     Pulses: Normal pulses.     Heart sounds: Normal heart sounds.  Pulmonary:     Effort: Pulmonary effort is normal.     Breath sounds: No wheezing, rhonchi or rales.  Abdominal:     Palpations: Abdomen is soft.     Tenderness: There is no abdominal tenderness.  Musculoskeletal:     Cervical back: Rigidity and tenderness present.  Lymphadenopathy:     Cervical: No cervical adenopathy.  Skin:    Findings: No rash.  Neurological:     Mental Status: She is alert.  Psychiatric:        Mood and Affect: Mood normal.     ED Results / Procedures / Treatments    Labs (all labs ordered are listed, but only abnormal results are displayed) Labs Reviewed  BASIC METABOLIC PANEL - Abnormal; Notable for the following components:      Result Value   Potassium 2.9 (*)    Glucose, Bld 135 (*)    Calcium 8.8 (*)    All other components within normal limits  RESP PANEL BY RT-PCR (RSV, FLU A&B, COVID)  RVPGX2  CBC WITH DIFFERENTIAL/PLATELET    EKG None  Radiology CT Angio Neck W and/or Wo Contrast Result Date: 06/20/2023 CLINICAL DATA:  Diffuse neck pain. Possible carotid artery aneurysm. EXAM: CT ANGIOGRAPHY NECK TECHNIQUE: Multidetector CT imaging of the neck was performed using the standard protocol during bolus administration of intravenous contrast. Multiplanar CT  image reconstructions and MIPs were obtained to evaluate the vascular anatomy. Carotid stenosis measurements (when applicable) are obtained utilizing NASCET criteria, using the distal internal carotid diameter as the denominator. RADIATION DOSE REDUCTION: This exam was performed according to the departmental dose-optimization program which includes automated exposure control, adjustment of the mA and/or kV according to patient size and/or use of iterative reconstruction technique. CONTRAST:  75mL OMNIPAQUE IOHEXOL 350 MG/ML SOLN COMPARISON:  None Available. FINDINGS: Aortic arch: Mild aortic atherosclerosis. Normal 3 vessel branching pattern. Right carotid system: Mild atherosclerosis at the carotid bifurcation without hemodynamically significant stenosis. Left carotid system: Mild calcific atherosclerosis at the bifurcation without hemodynamically significant stenosis. Mild undulation of the proximal ICA. Vertebral arteries: Right dominant configuration. No stenosis or occlusion. Skeleton: Heterogeneous and expanded appearance of the left sphenoid bone, fibrous dysplasia. C3-7 anterior fusion. T1-2 posterior fusion. C7-T2 cerclage wires of the spinous processes. Other neck: Negative Upper chest:  Negative IMPRESSION: 1. No hemodynamically significant stenosis of the carotid or vertebral arteries. 2. Mild undulation of the proximal left ICA, possibly fibromuscular dysplasia. 3. Left sphenoid bone fibrous dysplasia. Aortic atherosclerosis (ICD10-I70.0). Electronically Signed   By: Deatra Robinson M.D.   On: 06/20/2023 19:37   CT C-SPINE NO CHARGE Result Date: 06/20/2023 CLINICAL DATA:  Neck pain EXAM: CT Cervical Spine without contrast TECHNIQUE: Multiplanar CT images of the cervical spine were reconstructed from contemporary CT of the Neck. RADIATION DOSE REDUCTION: This exam was performed according to the departmental dose-optimization program which includes automated exposure control, adjustment of the mA and/or kV according to patient size and/or use of iterative reconstruction technique. CONTRAST:  None or none additional COMPARISON:  CT 09/13/2019 FINDINGS: Alignment: Stable alignment with generalized straightening of the cervical spine and grade 1 anterolisthesis C7 on T1. Facet alignment is maintained. Skull base and vertebrae: Vertebral body heights are maintained. No fracture Soft tissues and spinal canal: Limited by artifact from extensive spinal hardware Disc levels: Anterior fusion hardware C3-C4 with additional plate and fixating screws C4 through C7. Partially visualized posterior fusion at T1-T2 with posterior cerclage wires from C7-T2. Moderate disc space narrowing at C2-C3 and moderate severe degenerative change at C7-T1. Facet degenerative changes at multiple levels Upper chest: Mild emphysema Other: None IMPRESSION: 1. Extensive postsurgical changes of the cervical spine. No acute osseous abnormality. 2. Emphysema. Emphysema (ICD10-J43.9). Electronically Signed   By: Jasmine Pang M.D.   On: 06/20/2023 19:14    Procedures Procedures    Medications Ordered in ED Medications  potassium chloride SA (KLOR-CON M) CR tablet 40 mEq (has no administration in time range)  diazepam (VALIUM)  injection 5 mg (5 mg Intravenous Given 06/20/23 1722)  iohexol (OMNIPAQUE) 350 MG/ML injection 75 mL (75 mLs Intravenous Contrast Given 06/20/23 1816)    ED Course/ Medical Decision Making/ A&P                                 Medical Decision Making Amount and/or Complexity of Data Reviewed Labs: ordered. Radiology: ordered.  Risk Prescription drug management.   BP (!) 159/78 (BP Location: Left Arm)   Pulse 63   Temp 99.5 F (37.5 C) (Oral)   Resp 20   SpO2 99%   28:63 PM  78 year old female history of hypertension, arthritis, chronic back pain, diabetes, CAD, GERD, prior cervical spine surgery presenting with complaint of neck pain.  Patient endorsed pain and stiffness about her neck, having some chills, feeling hot  and cold, and some sinus congestion that started since yesterday.  Despite massage, hot bath, and over-the-counter medication she noticed no improvement.  No significant headache, no nausea vomiting diarrhea no focal numbness or focal weakness or rash.  No recent sick contact.  Denies any change in her bedding or pillow.  On exam, patient appears uncomfortable.  She does exhibit some neck stiffness on palpation.  No deformity noted.  No step-off or crepitus noted.  She has 5 out of 5 strength to all 4 extremities.  Ear nose and throat exam unremarkable.  Heart with normal rate and rhythm, lungs clear abdomen soft nontender.  EMR review, patient had a dissection study in February of last year.  Impression did report patient has signs of carotid stenosis.  However, I currently have low suspicion for carotid dissection or acute stroke causing her current flulike symptoms.  -Labs ordered, independently viewed and interpreted by me.  Labs remarkable for K+ 2.9, supplementation given.  Covid, flu, rsv negative.   -The patient was maintained on a cardiac monitor.  I personally viewed and interpreted the cardiac monitored which showed an underlying rhythm of: sinus  bradycardia -Imaging independently viewed and interpreted by me and I agree with radiologist's interpretation.  Result remarkable for neck CT showing no dissection or aneurysm, no carotid stenosis.  Cspine CT showing extensive post surgical changes, without acute changes.  -This patient presents to the ED for concern of neck pain, this involves an extensive number of treatment options, and is a complaint that carries with it a high risk of complications and morbidity.  The differential diagnosis includes viral illness, spinal stenosis, dissection, meningitis -Co morbidities that complicate the patient evaluation includes chronic back pain, arthritis, htn, cad -Treatment includes diazepam, potassium -Reevaluation of the patient after these medicines showed that the patient improved -PCP office notes or outside notes reviewed -Discussion with attending Dr. [name] -Escalation to admission/observation considered: patients feels much better, is comfortable with discharge, and will follow up with PCP -Prescription medication considered, patient comfortable with flexeril -Social Determinant of Health considered which includes tobacco use.             Final Clinical Impression(s) / ED Diagnoses Final diagnoses:  Torticollis  Hypokalemia    Rx / DC Orders ED Discharge Orders          Ordered    cyclobenzaprine (FLEXERIL) 10 MG tablet  2 times daily PRN        06/20/23 2012              Fayrene Helper, PA-C 06/20/23 2013    Anders Simmonds T, DO 06/22/23 [phone]

## 2023-06-21 ENCOUNTER — Ambulatory Visit: Payer: Self-pay | Admitting: Family Medicine

## 2023-06-21 ENCOUNTER — Other Ambulatory Visit: Payer: Self-pay

## 2023-06-21 ENCOUNTER — Emergency Department (HOSPITAL_COMMUNITY)
Admission: EM | Admit: 2023-06-21 | Discharge: 2023-06-21 | Disposition: A | Discharge status: Home / Self Care | Payer: Medicare Other | Attending: Emergency Medicine | Admitting: Emergency Medicine

## 2023-06-21 ENCOUNTER — Emergency Department (HOSPITAL_BASED_OUTPATIENT_CLINIC_OR_DEPARTMENT_OTHER)
Admission: EM | Admit: 2023-06-21 | Discharge: 2023-06-21 | Discharge status: Against Medical Advice | Payer: Medicare Other

## 2023-06-21 DIAGNOSIS — Z79899 Other long term (current) drug therapy: Secondary | ICD-10-CM | POA: Diagnosis not present

## 2023-06-21 DIAGNOSIS — Z7982 Long term (current) use of aspirin: Secondary | ICD-10-CM | POA: Diagnosis not present

## 2023-06-21 DIAGNOSIS — M542 Cervicalgia: Secondary | ICD-10-CM | POA: Insufficient documentation

## 2023-06-21 DIAGNOSIS — Z743 Need for continuous supervision: Secondary | ICD-10-CM | POA: Diagnosis not present

## 2023-06-21 DIAGNOSIS — R6889 Other general symptoms and signs: Secondary | ICD-10-CM | POA: Diagnosis not present

## 2023-06-21 MED ORDER — KETOROLAC TROMETHAMINE 15 MG/ML IJ SOLN
15.0000 mg | Freq: Once | INTRAMUSCULAR | Status: AC
  Administered 2023-06-21: 15 mg via INTRAMUSCULAR
  Filled 2023-06-21: qty 1

## 2023-06-21 MED ORDER — LIDOCAINE 5 % EX PTCH
1.0000 | MEDICATED_PATCH | CUTANEOUS | Status: DC
  Administered 2023-06-21: 1 via TRANSDERMAL
  Filled 2023-06-21: qty 1

## 2023-06-21 MED ORDER — OXYCODONE HCL 5 MG PO TABS
5.0000 mg | ORAL_TABLET | ORAL | Status: AC
  Administered 2023-06-21: 5 mg via ORAL
  Filled 2023-06-21: qty 1

## 2023-06-21 NOTE — ED Triage Notes (Signed)
Patient from home via EMS, endorses L sided neck pain since Saturday without injury. Pain worse with movement. Seen at [facility] and given rx for flexiril which she states has not helped. Went back to [facility] this morning and LWBS.

## 2023-06-21 NOTE — ED Provider Notes (Signed)
[place] EMERGENCY DEPARTMENT AT [facility] Provider Note   CSN: [id number] Arrival date & time: 06/21/23  1521     History {Add pertinent medical, surgical, social history, OB history to HPI:1} Chief Complaint  Patient presents with   Torticollis    Shylynn Bruning is a 78 y.o. female.  78 year old female with a history of cervical spine surgery who presents emergency department neck pain.  Patient reports that since Saturday she has been having neck pain.  Says that it hurts on the left side of her neck around her trapezius.  Having pain with moving her arm.  Says that it hurts to move her neck as well.  No injuries.  Went to an emergency department yesterday and had a CT scan and CTA of the neck that did not show acute findings.  Denies any throat pain.  No fevers.  Told to tell the triage nurse that she might be having some numbness but denies any numbness or weakness to me.  No bowel or bladder incontinence.  Was prescribed Flexeril yesterday but has not yet picked up this medication from the pharmacy.         Home Medications Prior to Admission medications   Medication Sig Start Date End Date Taking? Authorizing Provider  ABRYSVO 120 MCG/0.5ML injection  09/09/22   [provider]  amLODipine (NORVASC) 5 MG tablet TAKE 1 TABLET(5 MG) BY MOUTH DAILY 03/30/23   Runell Gess, MD  aspirin 81 MG EC tablet Take 81 mg by mouth daily. Swallow whole.    [provider]  Aspirin-Acetaminophen-Caffeine (GOODY HEADACHE PO) Take 1 packet by mouth daily as needed (headaches).    [provider]  Blood Glucose Monitoring Suppl DEVI 1 each by Does not apply route in the morning, at noon, and at bedtime. May substitute to any manufacturer covered by patient's insurance. 09/02/22   [name], MD  Calcium Carb-Cholecalciferol (CALCIUM 500 + D PO) Take 1 tablet by mouth daily.    [provider]  clobetasol ointment (TEMOVATE) 0.05 %  Apply 1 Application topically 2 (two) times daily. Patient taking differently: Apply 1 Application topically 2 (two) times daily as needed (rash). 01/08/22   [name], MD  cyclobenzaprine (FLEXERIL) 10 MG tablet Take 1 tablet (10 mg total) by mouth 2 (two) times daily as needed for muscle spasms. 06/20/23   Fayrene Helper, PA-C  ferrous sulfate 325 (65 FE) MG EC tablet Take 325 mg by mouth daily.    [provider]  Glucose Blood (BLOOD GLUCOSE TEST STRIPS) STRP 1 each by In Vitro route in the morning, at noon, and at bedtime. May substitute to any manufacturer covered by patient's insurance. 09/02/22   [name], MD  guaiFENesin (MUCINEX) 600 MG 12 hr tablet Take by mouth 2 (two) times daily.    [provider]  Lancets Misc. MISC 1 each by Does not apply route in the morning, at noon, and at bedtime. May substitute to any manufacturer covered by patient's insurance. 09/02/22   [name], MD  meclizine (ANTIVERT) 25 MG tablet Take 1 tablet (25 mg total) by mouth 3 (three) times daily as needed for dizziness. 04/02/23   Geoffery Lyons, MD  mesalamine (LIALDA) 1.2 g EC tablet TAKE 4 TABLETS(4.8 GRAMS) BY MOUTH DAILY WITH BREAKFAST 05/06/23   Doree Albee, PA-C  methylPREDNISolone (MEDROL DOSEPAK) 4 MG TBPK tablet Take as prescribed on the box 03/03/23   London Sheer,  MD  Multiple Vitamins-Minerals (CENTRUM ADULTS PO) Take 1 tablet by mouth daily.     [provider]  Na Sulfate-K Sulfate-Mg Sulf (SUPREP BOWEL PREP KIT) 17.5-3.13-1.6 GM/177ML SOLN Take 1 kit by mouth as directed. For colonoscopy prep 05/25/23   Arnaldo Natal, NP  NON FORMULARY Diltiazem 2%/Lidocaine5% compound Use 3 x rectally daily for 2 months to heal anal fissure 07/30/22   Doree Albee, PA-C  olmesartan (BENICAR) 5 MG tablet TAKE 2 TABLETS BY MOUTH DAILY 04/22/23   [name], MD  Omega-3 Fatty Acids (FISH OIL PO) Take 700 mg by mouth daily.    [provider]  pantoprazole (PROTONIX) 40 MG tablet Take 1 tablet (40 mg total) by mouth 2 (two) times daily before a meal. 07/06/22   Doree Albee, PA-C  potassium chloride SA (KLOR-CON M) 20 MEQ tablet Take 2 tablets (40 mEq total) by mouth daily. 06/16/23   [name], MD  PREDNISOLON-MOXIFLOX-BROMFENAC OP Place 1 drop into both eyes daily as needed (irritation).    [provider]  PREVNAR 20 0.5 ML injection  09/09/22   [provider]  rosuvastatin (CRESTOR) 40 MG tablet Take 1 tablet (40 mg total) by mouth daily. 05/20/23   [facility], Roe Rutherford, PA  sucralfate (CARAFATE) 1 g tablet TAKE 1 TABLET(1 GRAM) BY MOUTH FOUR TIMES DAILY- WITH MEALS AND AT BEDTIME 04/05/23   Quentin Mulling R, PA-C  triamcinolone cream (KENALOG) 0.5 % Apply topically 2 (two) times daily. 02/12/23   [name], MD  vitamin B-12 (CYANOCOBALAMIN) 1000 MCG tablet Take 1,000 mcg by mouth daily.    [provider]      Allergies    Symbicort [budesonide-formoterol fumarate], Acetaminophen, Azithromycin, Lipitor [atorvastatin], Pregabalin, and Zyrtec [cetirizine]    Review of Systems   Review of Systems  Physical Exam Updated Vital Signs BP (!) 126/96 (BP Location: Right Arm)   Pulse 71   Temp 98.8 F (37.1 C) (Oral)   Resp 20   SpO2 100%  Physical Exam Vitals and nursing note reviewed.  Constitutional:      General: She is not in acute distress.    Appearance: She is well-developed.  HENT:     Head: Normocephalic and atraumatic.     Right Ear: External ear normal.     Left Ear: External ear normal.     Nose: Nose normal.  Eyes:     Extraocular Movements: Extraocular movements intact.     Conjunctiva/sclera: Conjunctivae normal.     Pupils: Pupils are equal, round, and reactive to light.  Neck:     Comments: No midline tenderness to palpation.  Increased tone of the left trapezius with tenderness palpation diffusely along this muscle. Pulmonary:     Effort: Pulmonary effort  is normal. No respiratory distress.  Musculoskeletal:     Cervical back: Normal range of motion and neck supple.     Right lower leg: No edema.     Left lower leg: No edema.     Comments: Motor: Muscle bulk and tone are normal. Strength is 5/5 in shoulder abduction, elbow flexion and extension, grip strength, hip flexion, knee flexion and extension, ankle dorsiflexion and plantar flexion bilaterally. Full strength of great toe dorsiflexion bilaterally.  Sensory: Intact sensation to light touch in C5-T1 dermatomes and L2 though S1 dermatomes bilaterally.   Skin:    General: Skin is warm and dry.  Neurological:     Mental Status: She is alert and  oriented to person, place, and time. Mental status is at baseline.  Psychiatric:        Mood and Affect: Mood normal.     ED Results / Procedures / Treatments   Labs (all labs ordered are listed, but only abnormal results are displayed) Labs Reviewed - No data to display  EKG None  Radiology CT Angio Neck W and/or Wo Contrast Result Date: 06/20/2023 CLINICAL DATA:  Diffuse neck pain. Possible carotid artery aneurysm. EXAM: CT ANGIOGRAPHY NECK TECHNIQUE: Multidetector CT imaging of the neck was performed using the standard protocol during bolus administration of intravenous contrast. Multiplanar CT image reconstructions and MIPs were obtained to evaluate the vascular anatomy. Carotid stenosis measurements (when applicable) are obtained utilizing NASCET criteria, using the distal internal carotid diameter as the denominator. RADIATION DOSE REDUCTION: This exam was performed according to the departmental dose-optimization program which includes automated exposure control, adjustment of the mA and/or kV according to patient size and/or use of iterative reconstruction technique. CONTRAST:  75mL OMNIPAQUE IOHEXOL 350 MG/ML SOLN COMPARISON:  None Available. FINDINGS: Aortic arch: Mild aortic atherosclerosis. Normal 3 vessel branching pattern. Right carotid  system: Mild atherosclerosis at the carotid bifurcation without hemodynamically significant stenosis. Left carotid system: Mild calcific atherosclerosis at the bifurcation without hemodynamically significant stenosis. Mild undulation of the proximal ICA. Vertebral arteries: Right dominant configuration. No stenosis or occlusion. Skeleton: Heterogeneous and expanded appearance of the left sphenoid bone, fibrous dysplasia. C3-7 anterior fusion. T1-2 posterior fusion. C7-T2 cerclage wires of the spinous processes. Other neck: Negative Upper chest: Negative IMPRESSION: 1. No hemodynamically significant stenosis of the carotid or vertebral arteries. 2. Mild undulation of the proximal left ICA, possibly fibromuscular dysplasia. 3. Left sphenoid bone fibrous dysplasia. Aortic atherosclerosis (ICD10-I70.0). Electronically Signed   By: Deatra Robinson M.D.   On: 06/20/2023 19:37   CT C-SPINE NO CHARGE Result Date: 06/20/2023 CLINICAL DATA:  Neck pain EXAM: CT Cervical Spine without contrast TECHNIQUE: Multiplanar CT images of the cervical spine were reconstructed from contemporary CT of the Neck. RADIATION DOSE REDUCTION: This exam was performed according to the departmental dose-optimization program which includes automated exposure control, adjustment of the mA and/or kV according to patient size and/or use of iterative reconstruction technique. CONTRAST:  None or none additional COMPARISON:  CT 09/13/2019 FINDINGS: Alignment: Stable alignment with generalized straightening of the cervical spine and grade 1 anterolisthesis C7 on T1. Facet alignment is maintained. Skull base and vertebrae: Vertebral body heights are maintained. No fracture Soft tissues and spinal canal: Limited by artifact from extensive spinal hardware Disc levels: Anterior fusion hardware C3-C4 with additional plate and fixating screws C4 through C7. Partially visualized posterior fusion at T1-T2 with posterior cerclage wires from C7-T2. Moderate disc  space narrowing at C2-C3 and moderate severe degenerative change at C7-T1. Facet degenerative changes at multiple levels Upper chest: Mild emphysema Other: None IMPRESSION: 1. Extensive postsurgical changes of the cervical spine. No acute osseous abnormality. 2. Emphysema. Emphysema (ICD10-J43.9). Electronically Signed   By: Jasmine Pang M.D.   On: 06/20/2023 19:14    Procedures Procedures  {Document cardiac monitor, telemetry assessment procedure when appropriate:1}  Medications Ordered in ED Medications - No data to display  ED Course/ Medical Decision Making/ A&P   {   Click here for ABCD2, HEART and other calculatorsREFRESH Note before signing :1}                              Medical  Decision Making  ***  {Document critical care time when appropriate:1} {Document review of labs and clinical decision tools ie heart score, Chads2Vasc2 etc:1}  {Document your independent review of radiology images, and any outside records:1} {Document your discussion with family members, caretakers, and with consultants:1} {Document social determinants of health affecting pt's care:1} {Document your decision making why or why not admission, treatments were needed:1} Final Clinical Impression(s) / ED Diagnoses Final diagnoses:  None    Rx / DC Orders ED Discharge Orders     None

## 2023-06-21 NOTE — Discharge Instructions (Signed)
You were seen for your neck pain in the emergency department.   At home, please use over-the-counter Tylenol and lidocaine patches.  Please take the muscle relaxer that you are prescribed for your pain as well.  Follow-up with your primary doctor in 2-3 days regarding your visit.  Follow-up with the spine clinic as soon as possible regarding your symptoms.  Return immediately to the emergency department if you experience any of the following: Numbness or weakness of your arms or legs, bowel or bladder incontinence, numbness while wiping after pooping or urinating, or any other concerning symptoms.    Thank you for visiting our Emergency Department. It was a pleasure taking care of you today.

## 2023-06-21 NOTE — ED Triage Notes (Signed)
Called name in lobby, no answer

## 2023-06-21 NOTE — Telephone Encounter (Signed)
Patient is sch for 2/6

## 2023-06-21 NOTE — Telephone Encounter (Signed)
Chief Complaint: neck pain Symptoms: 10/10 neck pain Frequency: symptoms since Saturday Pertinent Negatives: Patient denies fever, CP, SOB Disposition: [x] ED /[] Urgent Care (no appt availability in office) / [] Appointment(In office/virtual)/ []  [facility]/ [] Home Care/ [] Refused Recommended Disposition /[] [facility]/ []  Follow-up with PCP Additional Notes: Pt reports 10/10 neck pain. States it started as R shoulder pain on Saturday and progressed to neck pain. Pinched nerve in the neck in 2000. Pt denies any recent falls or injury. Pt went to the ED yesterday for the same. Was discharged with a prescription for Flexeril. Pt has not yet taken Flexeril - states her son is currently picking up the medication. Pt endorses pain in R arm and numbness in the L hand. Pt states she is very worried she is going to fall, hx of arthritis. Pt states she has been holding onto door frames while walking. Pt endorses "slight headache." RN asked pt to touch her chin to her chest, pt states she could not do it. Pt very tearful. Per protocol, pt advised to go back to the ED. Pt screaming and crying on and off, reporting pain with any movement. RN advised pt to stay seated. RN called 911 for transport. Pt called her son to alert him 911 was dispatched. Son said he could take the pt as soon as he returns to the house. RN called 911 call. RN stayed on the phone with pt until son returned home. RN advised pt and son to call 911 if they needed assistance or if pt worsens. Pt verbalized understanding and said they would leave now.   Copied from CRM [id number]. Topic: Clinical - Red Word Triage >> Jun 21, 2023 11:12 AM Lennart Pall wrote: Reason for CRM: Patient went to the ER yesterday for neck pain. She is in extreme pain and crying, unable to move her neck. Reason for Disposition  [1] Stiff neck (can't put chin to chest) AND [2] headache  Answer Assessment - Initial Assessment Questions 1. ONSET: "When  did the pain begin?"      Started on Saturday with shoulder pain. Seen yesterday in the ED for symptoms.  2. LOCATION: "Where does it hurt?"      Sides and the back. 3. PATTERN "Does the pain come and go, or has it been constant since it started?"      Constant - "I can't move, I just have to hold my head still." 4. SEVERITY: "How bad is the pain?"  (Scale 1-10; or mild, moderate, severe)   - NO PAIN (0): no pain or only slight stiffness    - MILD (1-3): doesn't interfere with normal activities    - MODERATE (4-7): interferes with normal activities or awakens from sleep    - SEVERE (8-10):  excruciating pain, unable to do any normal activities      10/10 5. RADIATION: "Does the pain go anywhere else, shoot into your arms?"     "If I move, it goes down into my shoulders." 6. CORD SYMPTOMS: "Any weakness or numbness of the arms or legs?"     Numbness in left hand - never felt it until today. "Only got up and went to the bathroom." Pt states she has been holding onto the door frames while walking." Pt states she is so afraid of falling and feels unsteady (hx of arthritis in the knees - left knee "will give out"). Normally does not need to hold anything while ambulating - does not use walker or cane.  7. CAUSE: "What do you think is causing the neck pain?"     Pt doesn't know. ED visit yesterday diagnosed with "torticollis" 8. NECK OVERUSE: "Any recent activities that involved turning or twisting the neck?"     No 9. OTHER SYMPTOMS: "Do you have any other symptoms?" (e.g., headache, fever, chest pain, difficulty breathing, neck swelling)     Pain up and down R arm. Numbness in left hand. Slight headache. No fever. Denies SOB or CP. Denies dizziness. Pt prescribed Flexeril from ED dr. Rock Nephew on his way to pick that up from the pharmacy.  Protocols used: Neck Pain or Stiffness-A-AH

## 2023-06-22 ENCOUNTER — Telehealth: Payer: Self-pay

## 2023-06-22 NOTE — Transitions of Care (Post Inpatient/ED Visit) (Signed)
 06/22/2023  Name: Angela Pugh MRN: [id number] DOB: April 18, 1946  Today's TOC FU Call Status: Today's TOC FU Call Status:: Successful TOC FU Call Completed TOC FU Call Complete Date: 06/22/23 Patient's Name and Date of Birth confirmed.  Transition Care Management Follow-up Telephone Call Date of Discharge: 06/20/23 Discharge Facility: [facility] ([facility]) Type of Discharge: Emergency Department Reason for ED Visit: Orthopedic Conditions Orthopedic/Injury Diagnosis:  (neck pain) How have you been since you were released from the hospital?: Same Any questions or concerns?: No  Items Reviewed: Did you receive and understand the discharge instructions provided?: Yes Medications obtained,verified, and reconciled?: Yes (Medications Reviewed) Any new allergies since your discharge?: No Dietary orders reviewed?: NA Do you have support at home?: Yes People in Home: child(ren), adult  Medications Reviewed Today: Medications Reviewed Today     Reviewed by Marko Skalski, [place] LABOR, CMA (Certified Medical Assistant) on 06/22/23 at 1514  Med List Status: <None>   Medication Order Taking? Sig Documenting Provider Last Dose Status Informant  ABRYSVO 120 MCG/0.5ML injection [id number] No  [provider] Taking Active   amLODipine  (NORVASC ) 5 MG tablet [id number] No TAKE 1 TABLET(5 MG) BY MOUTH DAILY Court Dorn PARAS, MD Taking Active   aspirin  81 MG EC tablet [id number] No Take 81 mg by mouth daily. Swallow whole. [provider] Taking Active Self  Aspirin -Acetaminophen -Caffeine (GOODY HEADACHE PO) [id number] No Take 1 packet by mouth daily as needed (headaches). [provider] Taking Active Self  Blood Glucose Monitoring Suppl DEVI [id number] No 1 each by Does not apply route in the morning, at noon, and at bedtime. May substitute to any manufacturer covered by patient's insurance. Mercer Clotilda SAUNDERS, MD Taking Active Self  Calcium  Carb-Cholecalciferol  (CALCIUM  500 + D  PO) [id number] No Take 1 tablet by mouth daily. [provider] Taking Active Self  clobetasol  ointment (TEMOVATE ) 0.05 % [id number] No Apply 1 Application topically 2 (two) times daily.  Patient taking differently: Apply 1 Application topically 2 (two) times daily as needed (rash).   Mercer Clotilda SAUNDERS, MD Taking Active Self  cyclobenzaprine  (FLEXERIL ) 10 MG tablet [id number]  Take 1 tablet (10 mg total) by mouth 2 (two) times daily as needed for muscle spasms. Nivia Colon, PA-C  Active   diclofenac  Sodium (VOLTAREN ) 1 % topical gel 2 g [id number]   Georgina Ozell LABOR, MD  Active   ferrous sulfate  325 (65 FE) MG EC tablet [id number] No Take 325 mg by mouth daily. [provider] Taking Active Self  Glucose Blood (BLOOD GLUCOSE TEST STRIPS) STRP [id number] No 1 each by In Vitro route in the morning, at noon, and at bedtime. May substitute to any manufacturer covered by patient's insurance. Mercer Clotilda SAUNDERS, MD Taking Active Self  guaiFENesin (MUCINEX) 600 MG 12 hr tablet [id number] No Take by mouth 2 (two) times daily. [provider] Taking Active   Lancets Misc. MISC [id number] No 1 each by Does not apply route in the morning, at noon, and at bedtime. May substitute to any manufacturer covered by patient's insurance. Mercer Clotilda SAUNDERS, MD Taking Active Self  meclizine  (ANTIVERT ) 25 MG tablet [id number] No Take 1 tablet (25 mg total) by mouth 3 (three) times daily as needed for dizziness. Geroldine Berg, MD Taking Active   mesalamine  (LIALDA ) 1.2 g EC tablet [id number] No TAKE 4 TABLETS(4.8 GRAMS) BY MOUTH DAILY WITH BREAKFAST Collier, Amanda R, PA-C Taking Active   methylPREDNISolone  (MEDROL  DOSEPAK) 4 MG TBPK tablet [id number] No Take as prescribed on the box Georgina,  Ozell LABOR, MD Taking Active   Multiple Vitamins-Minerals (CENTRUM ADULTS PO) [id number] No Take 1 tablet by mouth daily.  [provider] Taking Active Self  Na Sulfate-K Sulfate-Mg Sulf (SUPREP BOWEL PREP KIT)  17.5-3.13-1.6 GM/177ML SOLN [id number]  Take 1 kit by mouth as directed. For colonoscopy prep Cara Elida HERO, NP  Active   NON FORMULARY [id number] No Diltiazem 2%/Lidocaine5% compound Use 3 x rectally daily for 2 months to heal anal fissure Craig Alan SAUNDERS, PA-C Taking Active Self           Med Note ZENA, CHERYL A   Thu Dec 31, 2022  9:11 AM) Has not needed recently  olmesartan  (BENICAR ) 5 MG tablet [id number] No TAKE 2 TABLETS BY MOUTH DAILY Mercer Clotilda SAUNDERS, MD Taking Active   Omega-3 Fatty Acids (FISH OIL PO) [id number] No Take 700 mg by mouth daily. [provider] Taking Active Self  pantoprazole  (PROTONIX ) 40 MG tablet [id number] No Take 1 tablet (40 mg total) by mouth 2 (two) times daily before a meal. Craig Alan SAUNDERS, PA-C Taking Active Self  potassium chloride  SA (KLOR-CON  M) 20 MEQ tablet [id number]  Take 2 tablets (40 mEq total) by mouth daily. Mercer Clotilda SAUNDERS, MD  Active   [facility] OP [id number] No Place 1 drop into both eyes daily as needed (irritation). [provider] Taking Active Self           Med Note ZENA, CHERYL A   Thu Dec 31, 2022  9:11 AM) Only if needed- hasn't used recently  PREVNAR 20 0.5 ML injection [id number] No  [provider] Taking Active   rosuvastatin  (CRESTOR ) 40 MG tablet [id number] No Take 1 tablet (40 mg total) by mouth daily. Madie Jon Garre, PA Taking Active   sucralfate  (CARAFATE ) 1 g tablet [id number] No TAKE 1 TABLET(1 GRAM) BY MOUTH FOUR TIMES DAILY- WITH MEALS AND AT BEDTIME Collier, Amanda R, PA-C Taking Active   triamcinolone  cream (KENALOG ) 0.5 % [id number] No Apply topically 2 (two) times daily. Mercer Clotilda SAUNDERS, MD Taking Active   vitamin B-12 (CYANOCOBALAMIN ) 1000 MCG tablet [id number] No Take 1,000 mcg by mouth daily. [provider] Taking Active Self            Home Care and Equipment/Supplies: Were Home Health Services Ordered?: NA Any new equipment or medical  supplies ordered?: NA  Functional Questionnaire: Do you need assistance with bathing/showering or dressing?: No Do you need assistance with meal preparation?: No Do you need assistance with eating?: No Do you have difficulty maintaining continence: No Do you need assistance with getting out of bed/getting out of a chair/moving?: No Do you have difficulty managing or taking your medications?: No  Follow up appointments reviewed: PCP Follow-up appointment confirmed?: Yes Date of PCP follow-up appointment?: 06/24/23 Follow-up Provider: Clotilda Mercer MD Specialist Hospital Follow-up appointment confirmed?: No Reason Specialist Follow-Up Not Confirmed: Patient has Specialist Provider Number and will Call for Appointment Do you need transportation to your follow-up appointment?: No Do you understand care options if your condition(s) worsen?: Yes-patient verbalized understanding    Braelynn Benning, CMA  [facility] AWV Team Direct Dial: [phone]

## 2023-06-24 ENCOUNTER — Ambulatory Visit (INDEPENDENT_AMBULATORY_CARE_PROVIDER_SITE_OTHER): Payer: Medicare Other | Admitting: Family Medicine

## 2023-06-24 ENCOUNTER — Encounter: Payer: Self-pay | Admitting: Family Medicine

## 2023-06-24 VITALS — BP 162/100 | HR 73 | Temp 98.4°F | Ht 68.0 in

## 2023-06-24 DIAGNOSIS — I10 Essential (primary) hypertension: Secondary | ICD-10-CM | POA: Diagnosis not present

## 2023-06-24 DIAGNOSIS — M545 Low back pain, unspecified: Secondary | ICD-10-CM

## 2023-06-24 DIAGNOSIS — R3129 Other microscopic hematuria: Secondary | ICD-10-CM

## 2023-06-24 DIAGNOSIS — E1169 Type 2 diabetes mellitus with other specified complication: Secondary | ICD-10-CM

## 2023-06-24 DIAGNOSIS — M542 Cervicalgia: Secondary | ICD-10-CM | POA: Diagnosis not present

## 2023-06-24 DIAGNOSIS — R1032 Left lower quadrant pain: Secondary | ICD-10-CM

## 2023-06-24 LAB — POCT URINALYSIS DIPSTICK
Bilirubin, UA: NEGATIVE
Blood, UA: POSITIVE
Glucose, UA: NEGATIVE
Ketones, UA: POSITIVE
Nitrite, UA: NEGATIVE
Protein, UA: POSITIVE — AB
Spec Grav, UA: 1.02 (ref 1.010–1.025)
Urobilinogen, UA: 1 U/dL
pH, UA: 6 (ref 5.0–8.0)

## 2023-06-24 LAB — URINALYSIS, ROUTINE W REFLEX MICROSCOPIC
Nitrite: NEGATIVE
Specific Gravity, Urine: 1.025 (ref 1.000–1.030)
Total Protein, Urine: 30 — AB
Urine Glucose: NEGATIVE
Urobilinogen, UA: 2 — AB (ref 0.0–1.0)
pH: 6 (ref 5.0–8.0)

## 2023-06-24 NOTE — Progress Notes (Signed)
 Established Patient Office Visit   Subjective  Patient ID: Angela Pugh, female    DOB: 12/13/1945  Age: 78 y.o. MRN: [id number]  Chief Complaint  Patient presents with   Follow-up    Patient was seen in the ED 2/2 and 2/3 for neck pain, the pain started 6 days ago, rate of pain 10 out of 10, shooting sharp pain that has moved to shoulder and chest, patient is wearing neck brace   Pt accompanied by her daughter.  Pt is a 78 yo female seen for acute concern.  Pt with neck and shoulder pain x several days.  Pt seen in ED 2/2 and 2/3.  CT C-spine and CTA largely negative.  Given lidocaine  patch, msk rlxr, Toradol .  Pt states pain in 10/10.  Worse with movement.  Pt unable to eat or take meds due to the pain.  States also having pain in L side of mid/low back and abd pain.  Unable to pinpoint or describe further.  Endorses increased flatus.    Patient Active Problem List   Diagnosis Date Noted   Diverticulosis 04/01/2023   Constipation 02/18/2023   Radiculopathy, lumbar region 12/08/2022   Spondylolisthesis, lumbar region 12/08/2022   Headache above the eye region 02/19/2021   Nasal congestion 02/19/2021   Emphysema lung (HCC) 10/04/2020   Abdominal pain, epigastric 03/18/2020   Body mass index (BMI) 26.0-26.9, adult 10/23/2019   Nonischemic cardiomyopathy (HCC) 04/12/2018   Chest pain 01/25/2018   Left bundle branch block 01/25/2018   Carotid artery disease (HCC) 01/25/2018   Seasonal allergies 10/18/2017   Arthritis 10/18/2017   Pre-diabetes 10/18/2017   Protein-calorie malnutrition, severe 05/05/2016   Dysphagia 05/04/2016   Displacement of cervical intervertebral disc without myelopathy 03/18/2016   Carpal tunnel syndrome 02/10/2016   Cervical spondylosis with radiculopathy 02/10/2016   Abdominal pain 02/01/2016   Acute diverticulitis 02/01/2016   HLD (hyperlipidemia) 02/01/2016   Diabetes mellitus without complication (HCC) 02/01/2016   Hypokalemia 02/01/2016    Hypertension    Cervical spondylosis without myelopathy 01/13/2016   Bilateral carpal tunnel syndrome 01/13/2016   Herniated lumbar intervertebral disc 06/30/2013    Class: Acute   Low back pain 02/06/2013   Past Medical History:  Diagnosis Date   Arthritis    back, knees, shoulder, hands , injections in pelvis, for bone spurs (05/04/2016)   Chronic back pain    Diverticulosis    Dysrhythmia    LBBB   GERD (gastroesophageal reflux disease)    History of kidney stones    HLD (hyperlipidemia)    Hypertension    Migraine    hx (05/04/2016)   Peripheral neuropathy    Radiculopathy    Sinus complaint    Sinus headache    occasional (05/04/2016)   Type II diabetes mellitus (HCC)    diet controlled, no meds now, was on insulin  & po treatment at one time     Vertigo    Past Surgical History:  Procedure Laterality Date   ANTERIOR CERVICAL DECOMP/DISCECTOMY FUSION N/A 04/17/2016   Procedure: ANTERIOR CERVICAL DECOMPRESSION/DISCECTOMY FUSION CERVICAL THREE - CERVICAL FOUR, POSTERIOR CERVICAL DISCECTOMY CERVICAL SEVEN -THORASIC ONE LEFT;  Surgeon: Rockey Peru, MD;  Location: [facility] OR;  Service: Neurosurgery;  Laterality: N/A;  Anterior/Posterior   ANTERIOR FUSION CERVICAL SPINE  2000   Dr. Gaither; for bone spurs; put hardware in too   BACK SURGERY N/A 12/08/2022   COLONOSCOPY  06/10/2016   [facility]   COLONOSCOPY  2023   LASIK Bilateral  LEFT HEART CATH AND CORONARY ANGIOGRAPHY N/A 03/17/2018   Procedure: LEFT HEART CATH AND CORONARY ANGIOGRAPHY;  Surgeon: Court Dorn PARAS, MD;  Location: [facility] INVASIVE CV LAB;  Service: Cardiovascular;  Laterality: N/A;   LUMBAR LAMINECTOMY Left 06/30/2013   Procedure: Left L3-4 Extraforaminal approach to excise far lateral herniated nucleus pulposus;  Surgeon: Lynwood FORBES Better, MD;  Location: [facility] OR;  Service: Orthopedics;  Laterality: Left;   TUBAL LIGATION     VAGINAL HYSTERECTOMY     Social History   Tobacco Use   Smoking status: Some Days     Current packs/day: 0.00    Average packs/day: 0.5 packs/day for 20.0 years (10.0 ttl pk-yrs)    Types: Cigarettes    Start date: 06/30/1962    Last attempt to quit: 06/30/1982    Years since quitting: 41.0   Smokeless tobacco: Never   Tobacco comments:    Patient only smokes in social setting when drinking. Admits former h/o heavy smoking years ago smoked 1.5 packs/day.    12/04/2021 Patient states she has not spoke for about 3 weeks    12/01/2022: pt will have a few drags if she is having a drink  Vaping Use   Vaping status: Never Used  Substance Use Topics   Alcohol use: Yes    Comment: special occasion   Drug use: No   Family History  Problem Relation Age of Onset   Uterine cancer Mother    Diabetes type II Sister    Diabetes Mellitus II Brother    Diverticulitis Daughter    Heart Problems Daughter    SIDS Son    Colon cancer Neg Hx    Pancreatic cancer Neg Hx    Esophageal cancer Neg Hx    Colon polyps Neg Hx    Rectal cancer Neg Hx    Stomach cancer Neg Hx    Allergies  Allergen Reactions   Symbicort  [Budesonide -Formoterol  Fumarate] Shortness Of Breath, Swelling and Other (See Comments)    Pharyngeal edema, hoarseness, tightness in chest, lips became dark.   Acetaminophen  Hives and Nausea Only   Azithromycin Hives and Nausea And Vomiting   Lipitor [Atorvastatin] Nausea Only    weakness   Pregabalin  Nausea And Vomiting    dizziness   Zyrtec [Cetirizine] Hives      ROS Negative unless stated above    Objective:     BP (!) 162/100 (BP Location: Right Arm, Patient Position: Sitting, Cuff Size: Normal) Comment: patient is in pain  Pulse 73   Temp 98.4 F (36.9 C) (Oral)   Ht 5' 8 (1.727 m)   SpO2 97%   BMI 26.76 kg/m  BP Readings from Last 3 Encounters:  06/24/23 (!) 162/100  06/21/23 (!) 126/96  06/20/23 (!) 154/72   Wt Readings from Last 3 Encounters:  05/25/23 176 lb (79.8 kg)  05/20/23 164 lb (74.4 kg)  04/21/23 169 lb 6.4 oz (76.8 kg)       Physical Exam Constitutional:      General: She is not in acute distress.    Appearance: Normal appearance.  HENT:     Head: Normocephalic and atraumatic.     Nose: Nose normal.     Mouth/Throat:     Mouth: Mucous membranes are moist.  Cardiovascular:     Rate and Rhythm: Normal rate and regular rhythm.     Heart sounds: Normal heart sounds. No murmur heard.    No gallop.  Pulmonary:     Effort: Pulmonary effort is  normal. No respiratory distress.     Breath sounds: Normal breath sounds. No wheezing, rhonchi or rales.  Abdominal:     General: Bowel sounds are normal.     Tenderness: There is generalized abdominal tenderness.  Skin:    General: Skin is warm and dry.  Neurological:     Mental Status: She is alert and oriented to person, place, and time.     Comments: Sitting in transport wheelchair with c-collar in place.  No weakness in bilateral LEs or UEs.     Results for orders placed or performed in visit on 06/24/23  POCT urinalysis dipstick  Result Value Ref Range   Color, UA amber    Clarity, UA clear    Glucose, UA Negative Negative   Bilirubin, UA neg    Ketones, UA positive    Spec Grav, UA 1.020 1.010 - 1.025   Blood, UA positive    pH, UA 6.0 5.0 - 8.0   Protein, UA Positive (A) Negative   Urobilinogen, UA 1.0 0.2 or 1.0 E.U./dL   Nitrite, UA neg    Leukocytes, UA Small (1+) (A) Negative   Appearance     Odor        Assessment & Plan:  Acute left-sided low back pain without sciatica -     POCT urinalysis dipstick -     Urine Culture -     Urinalysis, Routine w reflex microscopic -     CT RENAL STONE STUDY; Future  Neck pain  Essential hypertension  Type 2 diabetes mellitus with other specified complication, without long-term current use of insulin  (HCC)  Other microscopic hematuria -     Urine Culture -     Urinalysis, Routine w reflex microscopic -     CT RENAL STONE STUDY; Future  Left lower quadrant abdominal pain -     CT RENAL  STONE STUDY; Future  Pt with several concerns and difficulty fully describing.  Concern for possible renal calculi given intensity of symptoms and UA findings.  May also be having separate neck discomfort.  Discussed follow-up with neurosurgery given history of prior spinal surgeries.  No notes of hardware being loose on CT.  Arthritis may also be contributing.  Obtain UCX and stone study.  Discussed the importance of hydration.  Given strict precautions.  BP uncontrolled.  Patient advised if unable to keep down medications or stay hydrated will need to proceed to ED.   On day of service, 43 minutes spent reviewing the chart, counseling and/or coordinating care for plan and treatment of diagnosis below.     Return if symptoms worsen or fail to improve.   Clotilda JONELLE Single, MD

## 2023-06-24 NOTE — Patient Instructions (Addendum)
 Your urine has blood in it.  We will send it for culture to see if there is any bacterial causing a UTI.  Given your back pain and abdominal pain there is concern for possible kidney stone.  An order for a CT stone study was placed to further evaluate this.  We will also look at your urine closer under the microscope to see if there is any signs of debris or bacteria present.  Your urine also showed that you are very dehydrated.  It is important that you start eating and drinking otherwise you will need to go to the hospital.  You can take over-the-counter Gas-X for your increased gas.  Do not forget to set up follow-up appointments with your surgeon.

## 2023-06-25 ENCOUNTER — Ambulatory Visit
Admission: RE | Admit: 2023-06-25 | Discharge: 2023-06-25 | Disposition: A | Discharge status: Home / Self Care | Payer: Medicare Other | Source: Ambulatory Visit | Attending: Family Medicine

## 2023-06-25 DIAGNOSIS — K5730 Diverticulosis of large intestine without perforation or abscess without bleeding: Secondary | ICD-10-CM | POA: Diagnosis not present

## 2023-06-25 DIAGNOSIS — R1031 Right lower quadrant pain: Secondary | ICD-10-CM | POA: Diagnosis not present

## 2023-06-25 DIAGNOSIS — K828 Other specified diseases of gallbladder: Secondary | ICD-10-CM | POA: Diagnosis not present

## 2023-06-25 DIAGNOSIS — M545 Low back pain, unspecified: Secondary | ICD-10-CM

## 2023-06-25 DIAGNOSIS — R319 Hematuria, unspecified: Secondary | ICD-10-CM | POA: Diagnosis not present

## 2023-06-25 DIAGNOSIS — R1032 Left lower quadrant pain: Secondary | ICD-10-CM

## 2023-06-25 DIAGNOSIS — R3129 Other microscopic hematuria: Secondary | ICD-10-CM

## 2023-06-25 LAB — URINE CULTURE
MICRO NUMBER:: 16050898
SPECIMEN QUALITY:: ADEQUATE

## 2023-06-28 ENCOUNTER — Encounter: Payer: Self-pay | Admitting: Gastroenterology

## 2023-06-28 ENCOUNTER — Telehealth: Payer: Self-pay | Admitting: *Deleted

## 2023-06-28 ENCOUNTER — Telehealth: Payer: Self-pay

## 2023-06-28 DIAGNOSIS — E119 Type 2 diabetes mellitus without complications: Secondary | ICD-10-CM

## 2023-06-28 DIAGNOSIS — I10 Essential (primary) hypertension: Secondary | ICD-10-CM

## 2023-06-28 NOTE — Progress Notes (Signed)
 Complex Care Management Note  Care Guide Note 06/28/2023 Name: Angela Pugh MRN: [id number] DOB: 02-Oct-1945  Angela Pugh is a 78 y.o. year old female who sees Viola Greulich, MD for primary care. I reached out to Paticia Boning by phone today to offer complex care management services.  [name] was given information about Complex Care Management services today including:   The Complex Care Management services include support from the care team which includes your Nurse Care Manager, Clinical Social Worker, or Pharmacist.  The Complex Care Management team is here to help remove barriers to the health concerns and goals most important to you. Complex Care Management services are voluntary, and the patient may decline or stop services at any time by request to their care team member.   Complex Care Management Consent Status: Patient did not agree to participate in complex care management services at this time.  Follow up plan:  pt appreciative but declined need for services at this time   Encounter Outcome:  Patient Refused  Kandis Ormond, CMA, Care Guide [facility]  [facility], [facility] Guide Direct Dial: [phone]  Fax: [phone] Website: [email or web address]

## 2023-06-29 ENCOUNTER — Other Ambulatory Visit: Payer: Self-pay | Admitting: Cardiovascular Disease

## 2023-07-01 NOTE — Progress Notes (Signed)
Pt with neck pain and cold sxs, resp panel ordered

## 2023-07-04 NOTE — Progress Notes (Deleted)
 Cardiology Office Note:    Date:  07/04/2023   ID:  Angela Pugh, DOB Mar 08, 1946, MRN [id number]  PCP:  [name], MD   [facility] Providers Cardiologist:  Nanetta Batty, MD Advanced Heart Failure:  Arvilla Meres, MD { Click to update primary MD,subspecialty MD or APP then REFRESH:1}    Referring MD: [name], MD   No chief complaint on file. ***  History of Present Illness:    Angela Pugh is a 78 y.o. female with a hx of HTN, DM, carotid artery stenosis, LBBB, NICM with normal coronaries by heart catheterization.   Coronary CTA 02/2018 showed FFR 0.78 in the proximal LAD prompting LHC. LHC showed normal coronary arteries and moderate LV dysfunction with LVEF 40%. She was referred to AHF clinic and cardiac MRI showed no evidence of myocarditis. Heart monitor was NSR. GDMT titrated for NICM with baseline LVEF 35-40%. Most recent echo 12/2021 with normalized LVEF 50-55%, grade 1 DD, mild MR.   Of note, she is on 81 mg ASA and also taking Goody powders.  She wen to the ER 04/01/23 with "feeling  unwell." CE negative x 2 and BNP minimally elevated.   She called our office stating she had electrical sensations on the left side of her chest x 2-3 months and placed on my schedule - I saw her 05/20/23. She was able to climb 2 flights of stairs with SOB in the setting of ongoing smoking. I repeated an echocardiogram that showed low normal LVEF 50-55%, grade 2 DD, severely dilated left atrium, ascending aorta measuring 40 mm.    I suspected her dyspnea on exertion was complicated by likely COPD and ongoing URI at that visit.  Bilateral arm pain sounded related to nerve issues that started last summer. No CAD on heart cath 2019.  She was seen in the ER 06/20/23 with neck pain.   She presents today for follow up.       Bilateral arm pain DOE Chronic diastolic heart failure - LVEF 40-98% and grade 2 DD - continue 5 mg olmesartan     Chronic  systolic and diastolic heart failure secondary to NICM - LVEF 35-40%, now preserved with grade 2 DD - GDMT: 5 mg olmesartan - not volume up on exam   Hypertension - olmesartan and amlodipine   Hyperlipidemia with LDL goal < 70 02/12/2023: Cholesterol 191; HDL 66.70; LDL Cholesterol 97; Triglycerides 136.0; VLDL 27.2 - omega 3 fatty acids, 20 mg crestor - I opted to increase crestor to 40 mg in Jan 2025   Tobacco abuse Wheezing on exam - I referred her to pulmonology at last visit        Past Medical History:  Diagnosis Date   Arthritis    back, knees, shoulder, hands , injections in pelvis, for bone spurs (05/04/2016)   Chronic back pain    Diverticulosis    Dysrhythmia    LBBB   GERD (gastroesophageal reflux disease)    History of kidney stones    HLD (hyperlipidemia)    Hypertension    Migraine    hx (05/04/2016)   Peripheral neuropathy    Radiculopathy    Sinus complaint    Sinus headache    occasional (05/04/2016)   Type II diabetes mellitus (HCC)    diet controlled, no meds now, was on insulin & po treatment at one time     Vertigo     Past Surgical History:  Procedure Laterality Date  ANTERIOR CERVICAL DECOMP/DISCECTOMY FUSION N/A 04/17/2016   Procedure: ANTERIOR CERVICAL DECOMPRESSION/DISCECTOMY FUSION CERVICAL THREE - CERVICAL FOUR, POSTERIOR CERVICAL DISCECTOMY CERVICAL SEVEN -THORASIC ONE LEFT;  Surgeon: Coletta Memos, MD;  Location: [facility] OR;  Service: Neurosurgery;  Laterality: N/A;  Anterior/Posterior   ANTERIOR FUSION CERVICAL SPINE  2000   Dr. Channing Mutters; "for bone spurs; put hardware in too"   BACK SURGERY N/A 12/08/2022   COLONOSCOPY  06/10/2016   Gabem   COLONOSCOPY  2023   LASIK Bilateral    LEFT HEART CATH AND CORONARY ANGIOGRAPHY N/A 03/17/2018   Procedure: LEFT HEART CATH AND CORONARY ANGIOGRAPHY;  Surgeon: Runell Gess, MD;  Location: [facility] INVASIVE [facility];  Service: Cardiovascular;  Laterality: N/A;   LUMBAR LAMINECTOMY Left 06/30/2013    Procedure: Left L3-4 Extraforaminal approach to excise far lateral herniated nucleus pulposus;  Surgeon: Kerrin Champagne, MD;  Location: [facility] OR;  Service: Orthopedics;  Laterality: Left;   TUBAL LIGATION     VAGINAL HYSTERECTOMY      Current Medications: No outpatient medications have been marked as taking for the 07/09/23 encounter (Appointment) with Marcelino Duster, PA.   Current Facility-Administered Medications for the 07/09/23 encounter (Appointment) with Marcelino Duster, PA  Medication   diclofenac Sodium (VOLTAREN) 1 % topical gel 2 g     Allergies:   Symbicort [budesonide-formoterol fumarate], Acetaminophen, Azithromycin, Lipitor [atorvastatin], Pregabalin, and Zyrtec [cetirizine]   Social History   Socioeconomic History   Marital status: Widowed    Spouse name: Not on file   Number of children: 6   Years of education: Not on file   Highest education level: Not on file  Occupational History   Occupation: retired  Tobacco Use   Smoking status: Some Days    Current packs/day: 0.00    Average packs/day: 0.5 packs/day for 20.0 years (10.0 ttl pk-yrs)    Types: Cigarettes    Start date: 06/30/1962    Last attempt to quit: 06/30/1982    Years since quitting: 41.0   Smokeless tobacco: Never   Tobacco comments:    Patient only smokes in social setting when drinking. Admits former h/o heavy smoking years ago smoked 1.5 packs/day.    12/04/2021 Patient states she has not spoke for about 3 weeks    12/01/2022: pt will have a few drags if she is having a drink  Vaping Use   Vaping status: Never Used  Substance and Sexual Activity   Alcohol use: Yes    Comment: special occasion   Drug use: No   Sexual activity: Not Currently  Other Topics Concern   Not on file  Social History Narrative   Not on file   Social Drivers of Health   Financial Resource Strain: Low Risk  (02/19/2023)   Overall Financial Resource Strain (CARDIA)    Difficulty of Paying Living Expenses: Not  hard at all  Food Insecurity: No Food Insecurity (02/19/2023)   Hunger Vital Sign    Worried About Running Out of Food in the Last Year: Never true    Ran Out of Food in the Last Year: Never true  Transportation Needs: No Transportation Needs (02/19/2023)   PRAPARE - Administrator, Civil Service (Medical): No    Lack of Transportation (Non-Medical): No  Physical Activity: Sufficiently Active (02/19/2023)   Exercise Vital Sign    Days of Exercise per Week: 7 days    Minutes of Exercise per Session: 60 min  Stress: No Stress Concern Present (02/19/2023)  [employer] of Occupational Health - Occupational Stress Questionnaire    Feeling of Stress : Not at all  Social Connections: Moderately Integrated (02/19/2023)   Social Connection and Isolation Panel [NHANES]    Frequency of Communication with Friends and Family: More than three times a week    Frequency of Social Gatherings with Friends and Family: More than three times a week    Attends Religious Services: More than 4 times per year    Active Member of [employer] or Organizations: Yes    Attends Banker Meetings: More than 4 times per year    Marital Status: Widowed     Family History: The patient's ***family history includes Diabetes Mellitus II in her brother; Diabetes type II in her sister; Diverticulitis in her daughter; Heart Problems in her daughter; SIDS in her son; Uterine cancer in her mother. There is no history of Colon cancer, Pancreatic cancer, Esophageal cancer, Colon polyps, Rectal cancer, or Stomach cancer.  ROS:   Please see the history of present illness.    *** All other systems reviewed and are negative.  EKGs/Labs/Other Studies Reviewed:    The following studies were reviewed today: ***      Recent Labs: 02/12/2023: TSH 1.88 04/01/2023: B Natriuretic Peptide 124.7 05/25/2023: ALT 8 06/20/2023: BUN 10; Creatinine, Ser 0.78; Hemoglobin 13.7; Platelets 237; Potassium 2.9; Sodium 141   Recent Lipid Panel    Component Value Date/Time   CHOL 191 02/12/2023 1003   CHOL 205 (H) 06/23/2022 1051   TRIG 136.0 02/12/2023 1003   HDL 66.70 02/12/2023 1003   HDL 56 06/23/2022 1051   CHOLHDL 3 02/12/2023 1003   VLDL 27.2 02/12/2023 1003   LDLCALC 97 02/12/2023 1003   LDLCALC 119 (H) 06/23/2022 1051     Risk Assessment/Calculations:   {Does this patient have ATRIAL FIBRILLATION?:[phone]}  No BP recorded.  {Refresh Note OR Click here to enter BP  :1}***         Physical Exam:    VS:  There were no vitals taken for this visit.    Wt Readings from Last 3 Encounters:  05/25/23 176 lb (79.8 kg)  05/20/23 164 lb (74.4 kg)  04/21/23 169 lb 6.4 oz (76.8 kg)     GEN: *** Well nourished, well developed in no acute distress HEENT: Normal NECK: No JVD; No carotid bruits LYMPHATICS: No lymphadenopathy CARDIAC: ***RRR, no murmurs, rubs, gallops RESPIRATORY:  Clear to auscultation without rales, wheezing or rhonchi  ABDOMEN: Soft, non-tender, non-distended MUSCULOSKELETAL:  No edema; No deformity  SKIN: Warm and dry NEUROLOGIC:  Alert and oriented x 3 PSYCHIATRIC:  Normal affect   ASSESSMENT:    No diagnosis found. PLAN:    In order of problems listed above:  ***      {Are you ordering a CV Procedure (e.g. stress test, cath, DCCV, TEE, etc)?   Press F2        :[id number]}    Medication Adjustments/Labs and Tests Ordered: Current medicines are reviewed at length with the patient today.  Concerns regarding medicines are outlined above.  No orders of the defined types were placed in this encounter.  No orders of the defined types were placed in this encounter.   There are no Patient Instructions on file for this visit.   Signed, Marcelino Duster, PA  07/04/2023 7:01 PM    [facility]

## 2023-07-05 DIAGNOSIS — H00022 Hordeolum internum right lower eyelid: Secondary | ICD-10-CM | POA: Diagnosis not present

## 2023-07-06 ENCOUNTER — Other Ambulatory Visit: Payer: Self-pay | Admitting: Gastroenterology

## 2023-07-06 ENCOUNTER — Other Ambulatory Visit: Payer: Self-pay | Admitting: Physician Assistant

## 2023-07-06 ENCOUNTER — Encounter: Payer: Self-pay | Admitting: Gastroenterology

## 2023-07-06 ENCOUNTER — Ambulatory Visit: Payer: Medicare Other | Admitting: Gastroenterology

## 2023-07-06 VITALS — BP 148/66 | HR 65 | Temp 104.0°F | Resp 22 | Ht 68.0 in | Wt 176.0 lb

## 2023-07-06 DIAGNOSIS — K6389 Other specified diseases of intestine: Secondary | ICD-10-CM | POA: Diagnosis not present

## 2023-07-06 DIAGNOSIS — K5730 Diverticulosis of large intestine without perforation or abscess without bleeding: Secondary | ICD-10-CM | POA: Diagnosis not present

## 2023-07-06 DIAGNOSIS — K625 Hemorrhage of anus and rectum: Secondary | ICD-10-CM

## 2023-07-06 DIAGNOSIS — K640 First degree hemorrhoids: Secondary | ICD-10-CM

## 2023-07-06 DIAGNOSIS — R1084 Generalized abdominal pain: Secondary | ICD-10-CM

## 2023-07-06 DIAGNOSIS — I10 Essential (primary) hypertension: Secondary | ICD-10-CM | POA: Diagnosis not present

## 2023-07-06 DIAGNOSIS — R140 Abdominal distension (gaseous): Secondary | ICD-10-CM

## 2023-07-06 DIAGNOSIS — K648 Other hemorrhoids: Secondary | ICD-10-CM | POA: Diagnosis not present

## 2023-07-06 DIAGNOSIS — E119 Type 2 diabetes mellitus without complications: Secondary | ICD-10-CM | POA: Diagnosis not present

## 2023-07-06 DIAGNOSIS — Q439 Congenital malformation of intestine, unspecified: Secondary | ICD-10-CM

## 2023-07-06 DIAGNOSIS — K5010 Crohn's disease of large intestine without complications: Secondary | ICD-10-CM

## 2023-07-06 MED ORDER — DICYCLOMINE HCL 10 MG PO CAPS: 10.0000 mg | ORAL_CAPSULE | Freq: Four times a day (QID) | ORAL | 3 refills | Status: AC | PRN

## 2023-07-06 MED ORDER — SODIUM CHLORIDE 0.9 % IV SOLN
500.0000 mL | Freq: Once | INTRAVENOUS | Status: DC
Start: 2023-07-06 — End: 2023-07-06

## 2023-07-06 NOTE — Progress Notes (Signed)
GASTROENTEROLOGY PROCEDURE H&P NOTE   Primary Care Physician: [name], MD    Reason for Procedure:  Abdominal pain, hematochezia, history of segmental colitis, change in bowel habits  Plan:    Colonoscopy  Patient is appropriate for endoscopic procedure(s) in the ambulatory ([facility]) setting.  The nature of the procedure, as well as the risks, benefits, and alternatives were carefully and thoroughly reviewed with the patient. Ample time for discussion and questions allowed. The patient understood, was satisfied, and agreed to proceed.     HPI: Angela Pugh is a 78 y.o. female who presents for colonoscopy for evaluation of abdominal pain, hematochezia, change in bowel habits.  She has a history of segmental colitis diagnosed on prior colonoscopy in 04/2020.  Has been treating with mesalamine 4.8 g daily.  Recent CT without active inflammation, GB sludge noted but no e/o cholecystitis.  Recent RUQ ultrasound with 1.6 x 1.2 x 2.3 cm simple hepatic cyst, otherwise normal.  Recent normal CBC, liver enzymes, and CRP, but elevated ESR (63).  Past Medical History:  Diagnosis Date   Arthritis    back, knees, shoulder, hands , injections in pelvis, for bone spurs (05/04/2016)   Chronic back pain    Diverticulosis    Dysrhythmia    LBBB   GERD (gastroesophageal reflux disease)    History of kidney stones    HLD (hyperlipidemia)    Hypertension    Migraine    hx (05/04/2016)   Peripheral neuropathy    Radiculopathy    Sinus complaint    Sinus headache    occasional (05/04/2016)   Type II diabetes mellitus (HCC)    diet controlled, no meds now, was on insulin & po treatment at one time     Vertigo     Past Surgical History:  Procedure Laterality Date   ANTERIOR CERVICAL DECOMP/DISCECTOMY FUSION N/A 04/17/2016   Procedure: ANTERIOR CERVICAL DECOMPRESSION/DISCECTOMY FUSION CERVICAL THREE - CERVICAL FOUR, POSTERIOR CERVICAL DISCECTOMY CERVICAL SEVEN -THORASIC ONE LEFT;   Surgeon: Coletta Memos, MD;  Location: [facility] OR;  Service: Neurosurgery;  Laterality: N/A;  Anterior/Posterior   ANTERIOR FUSION CERVICAL SPINE  2000   Dr. Channing Mutters; "for bone spurs; put hardware in too"   BACK SURGERY N/A 12/08/2022   COLONOSCOPY  06/10/2016   [facility]em   COLONOSCOPY  2023   LASIK Bilateral    LEFT HEART CATH AND CORONARY ANGIOGRAPHY N/A 03/17/2018   Procedure: LEFT HEART CATH AND CORONARY ANGIOGRAPHY;  Surgeon: Runell Gess, MD;  Location: [facility] INVAS[facility];  Service: Cardiovascular;  Laterality: N/A;   LUMBAR LAMINECTOMY Left 06/30/2013   Procedure: Left L3-4 Extraforaminal approach to excise far lateral herniated nucleus pulposus;  Surgeon: Kerrin Champagne, MD;  Location: [facility];  Service: Orthopedics;  Laterality: Left;   TUBAL LIGATION     VAGINAL HYSTERECTOMY      Prior to Admission medications   Medication Sig Start Date End Date Taking? Authorizing Provider  ABRYSVO 120 MCG/0.5ML injection  09/09/22   [provider]  amLODipine (NORVASC) 5 MG tablet TAKE 1 TABLET(5 MG) BY MOUTH DAILY 06/29/23   Duke, Roe Rutherford, PA  aspirin 81 MG EC tablet Take 81 mg by mouth daily. Swallow whole.    [provider]  Aspirin-Acetaminophen-Caffeine (GOODY HEADACHE PO) Take 1 packet by mouth daily as needed (headaches).    [provider]  Blood Glucose Monitoring Suppl DEVI 1 each by Does not apply route in the morning, at noon, and at bedtime.  May substitute to any manufacturer covered by patient's insurance. 09/02/22   [name], MD  Calcium Carb-Cholecalciferol (CALCIUM 500 + D PO) Take 1 tablet by mouth daily.    [provider]  clobetasol ointment (TEMOVATE) 0.05 % Apply 1 Application topically 2 (two) times daily. Patient taking differently: Apply 1 Application topically 2 (two) times daily as needed (rash). 01/08/22   [name], MD  cyclobenzaprine (FLEXERIL) 10 MG tablet Take 1 tablet (10 mg total) by mouth 2 (two) times daily as  needed for muscle spasms. 06/20/23   Fayrene Helper, PA-C  ferrous sulfate 325 (65 FE) MG EC tablet Take 325 mg by mouth daily.    [provider]  Glucose Blood (BLOOD GLUCOSE TEST STRIPS) STRP 1 each by In Vitro route in the morning, at noon, and at bedtime. May substitute to any manufacturer covered by patient's insurance. 09/02/22   [name], MD  guaiFENesin (MUCINEX) 600 MG 12 hr tablet Take by mouth 2 (two) times daily.    [provider]  Lancets Misc. MISC 1 each by Does not apply route in the morning, at noon, and at bedtime. May substitute to any manufacturer covered by patient's insurance. 09/02/22   [name], MD  meclizine (ANTIVERT) 25 MG tablet Take 1 tablet (25 mg total) by mouth 3 (three) times daily as needed for dizziness. 04/02/23   Geoffery Lyons, MD  mesalamine (LIALDA) 1.2 g EC tablet TAKE 4 TABLETS(4.8 GRAMS) BY MOUTH DAILY WITH BREAKFAST 05/06/23   Doree Albee, PA-C  Multiple Vitamins-Minerals (CENTRUM ADULTS PO) Take 1 tablet by mouth daily.     [provider]  Na Sulfate-K Sulfate-Mg Sulf (SUPREP BOWEL PREP KIT) 17.5-3.13-1.6 GM/177ML SOLN Take 1 kit by mouth as directed. For colonoscopy prep 05/25/23   Arnaldo Natal, NP  NON FORMULARY Diltiazem 2%/Lidocaine5% compound Use 3 x rectally daily for 2 months to heal anal fissure 07/30/22   Doree Albee, PA-C  olmesartan (BENICAR) 5 MG tablet TAKE 2 TABLETS BY MOUTH DAILY 04/22/23   [name], MD  Omega-3 Fatty Acids (FISH OIL PO) Take 700 mg by mouth daily.    [provider]  pantoprazole (PROTONIX) 40 MG tablet Take 1 tablet (40 mg total) by mouth 2 (two) times daily before a meal. 07/06/22   Doree Albee, PA-C  potassium chloride SA (KLOR-CON M) 20 MEQ tablet Take 2 tablets (40 mEq total) by mouth daily. 06/16/23   [name], MD  PREDNISOLON-MOXIFLOX-BROMFENAC OP Place 1 drop into both eyes daily as needed (irritation).    [provider]   rosuvastatin (CRESTOR) 40 MG tablet Take 1 tablet (40 mg total) by mouth daily. 05/20/23   Duke, Roe Rutherford, PA  sucralfate (CARAFATE) 1 g tablet TAKE 1 TABLET(1 GRAM) BY MOUTH FOUR TIMES DAILY- WITH EACH MEAL AND AT BEDTIME 07/06/23   Quentin Mulling R, PA-C  triamcinolone cream (KENALOG) 0.5 % Apply topically 2 (two) times daily. 02/12/23   [name], MD  vitamin B-12 (CYANOCOBALAMIN) 1000 MCG tablet Take 1,000 mcg by mouth daily.    [provider]    Current Outpatient Medications  Medication Sig Dispense Refill   ABRYSVO 120 MCG/0.5ML injection      amLODipine (NORVASC) 5 MG tablet TAKE 1 TABLET(5 MG) BY MOUTH DAILY 90 tablet 0   aspirin 81 MG EC tablet Take 81 mg by mouth daily. Swallow whole.     Aspirin-Acetaminophen-Caffeine (GOODY HEADACHE PO) Take  1 packet by mouth daily as needed (headaches).     Blood Glucose Monitoring Suppl DEVI 1 each by Does not apply route in the morning, at noon, and at bedtime. May substitute to any manufacturer covered by patient's insurance. 1 each 0   Calcium Carb-Cholecalciferol (CALCIUM 500 + D PO) Take 1 tablet by mouth daily.     clobetasol ointment (TEMOVATE) 0.05 % Apply 1 Application topically 2 (two) times daily. (Patient taking differently: Apply 1 Application topically 2 (two) times daily as needed (rash).) 30 g 0   cyclobenzaprine (FLEXERIL) 10 MG tablet Take 1 tablet (10 mg total) by mouth 2 (two) times daily as needed for muscle spasms. 20 tablet 0   ferrous sulfate 325 (65 FE) MG EC tablet Take 325 mg by mouth daily.     Glucose Blood (BLOOD GLUCOSE TEST STRIPS) STRP 1 each by In Vitro route in the morning, at noon, and at bedtime. May substitute to any manufacturer covered by patient's insurance. 100 strip 11   guaiFENesin (MUCINEX) 600 MG 12 hr tablet Take by mouth 2 (two) times daily.     Lancets Misc. MISC 1 each by Does not apply route in the morning, at noon, and at bedtime. May substitute to any manufacturer covered by  patient's insurance. 100 each 11   meclizine (ANTIVERT) 25 MG tablet Take 1 tablet (25 mg total) by mouth 3 (three) times daily as needed for dizziness. 20 tablet 1   mesalamine (LIALDA) 1.2 g EC tablet TAKE 4 TABLETS(4.8 GRAMS) BY MOUTH DAILY WITH BREAKFAST 360 tablet 0   Multiple Vitamins-Minerals (CENTRUM ADULTS PO) Take 1 tablet by mouth daily.      Na Sulfate-K Sulfate-Mg Sulf (SUPREP BOWEL PREP KIT) 17.5-3.13-1.6 GM/177ML SOLN Take 1 kit by mouth as directed. For colonoscopy prep 354 mL 0   NON FORMULARY Diltiazem 2%/Lidocaine5% compound Use 3 x rectally daily for 2 months to heal anal fissure 30 g 1   olmesartan (BENICAR) 5 MG tablet TAKE 2 TABLETS BY MOUTH DAILY 180 tablet 0   Omega-3 Fatty Acids (FISH OIL PO) Take 700 mg by mouth daily.     pantoprazole (PROTONIX) 40 MG tablet Take 1 tablet (40 mg total) by mouth 2 (two) times daily before a meal. 180 tablet 3   potassium chloride SA (KLOR-CON M) 20 MEQ tablet Take 2 tablets (40 mEq total) by mouth daily. 180 tablet 1   PREDNISOLON-MOXIFLOX-BROMFENAC OP Place 1 drop into both eyes daily as needed (irritation).     rosuvastatin (CRESTOR) 40 MG tablet Take 1 tablet (40 mg total) by mouth daily. 90 tablet 0   sucralfate (CARAFATE) 1 g tablet TAKE 1 TABLET(1 GRAM) BY MOUTH FOUR TIMES DAILY- WITH EACH MEAL AND AT BEDTIME 360 tablet 0   triamcinolone cream (KENALOG) 0.5 % Apply topically 2 (two) times daily. 30 g 3   vitamin B-12 (CYANOCOBALAMIN) 1000 MCG tablet Take 1,000 mcg by mouth daily.     Current Facility-Administered Medications  Medication Dose Route Frequency Provider Last Rate Last Admin   0.9 %  sodium chloride infusion  500 mL Intravenous Once Demarie Hyneman V, DO       diclofenac Sodium (VOLTAREN) 1 % topical gel 2 g  2 g Topical PRN         Allergies as of 07/06/2023 - Review Complete 07/06/2023  Allergen Reaction Noted   Symbicort [budesonide-formoterol fumarate] Shortness Of Breath, Swelling, and Other (See Comments)  12/26/2021   Acetaminophen Hives and Nausea Only 07/02/2010  Azithromycin Hives and Nausea And Vomiting 10/18/2017   Lipitor [atorvastatin] Nausea Only 02/01/2016   Pregabalin Nausea And Vomiting 11/27/2022   Zyrtec [cetirizine] Hives 08/25/2019    Family History  Problem Relation Age of Onset   Uterine cancer Mother    Diabetes type II Sister    Diabetes Mellitus II Brother    Diverticulitis Daughter    Heart Problems Daughter    SIDS Son    Colon cancer Neg Hx    Pancreatic cancer Neg Hx    Esophageal cancer Neg Hx    Colon polyps Neg Hx    Rectal cancer Neg Hx    Stomach cancer Neg Hx     Social History   Socioeconomic History   Marital status: Widowed    Spouse name: Not on file   Number of children: 6   Years of education: Not on file   Highest education level: Not on file  Occupational History   Occupation: retired  Tobacco Use   Smoking status: Some Days    Current packs/day: 0.00    Average packs/day: 0.5 packs/day for 20.0 years (10.0 ttl pk-yrs)    Types: Cigarettes    Start date: 06/30/1962    Last attempt to quit: 06/30/1982    Years since quitting: 41.0   Smokeless tobacco: Never   Tobacco comments:    Patient only smokes in social setting when drinking. Admits former h/o heavy smoking years ago smoked 1.5 packs/day.    12/04/2021 Patient states she has not spoke for about 3 weeks    12/01/2022: pt will have a few drags if she is having a drink  Vaping Use   Vaping status: Never Used  Substance and Sexual Activity   Alcohol use: Yes    Comment: special occasion   Drug use: No   Sexual activity: Not Currently  Other Topics Concern   Not on file  Social History Narrative   Not on file   Social Drivers of Health   Financial Resource Strain: Low Risk  (02/19/2023)   Overall Financial Resource Strain (CARDIA)    Difficulty of Paying Living Expenses: Not hard at all  Food Insecurity: No Food Insecurity (02/19/2023)   Hunger Vital Sign    Worried  About Running Out of Food in the Last Year: Never true    Ran Out of Food in the Last Year: Never true  Transportation Needs: No Transportation Needs (02/19/2023)   PRAPARE - Administrator, Civil Service (Medical): No    Lack of Transportation (Non-Medical): No  Physical Activity: Sufficiently Active (02/19/2023)   Exercise Vital Sign    Days of Exercise per Week: 7 days    Minutes of Exercise per Session: 60 min  Stress: No Stress Concern Present (02/19/2023)   [employer] of Occupational Health - Occupational Stress Questionnaire    Feeling of Stress : Not at all  Social Connections: Moderately Integrated (02/19/2023)   Social Connection and Isolation Panel [NHANES]    Frequency of Communication with Friends and Family: More than three times a week    Frequency of Social Gatherings with Friends and Family: More than three times a week    Attends Religious Services: More than 4 times per year    Active Member of [employer] or Organizations: Yes    Attends Banker Meetings: More than 4 times per year    Marital Status: Widowed  Intimate Partner Violence: Not At Risk (02/19/2023)   Humiliation, Afraid, Rape, and  Kick questionnaire    Fear of Current or Ex-Partner: No    Emotionally Abused: No    Physically Abused: No    Sexually Abused: No    Physical Exam: Vital signs in last 24 hours: @BP  (!) 159/92   Pulse 79   Temp 97.8 F (36.6 C)   Ht 5\' 8"  (1.727 m)   Wt 176 lb (79.8 kg)   SpO2 100%   BMI 26.76 kg/m  GEN: NAD EYE: Sclerae anicteric ENT: MMM CV: Non-tachycardic Pulm: CTA b/l GI: Soft, NT/ND NEURO:  Alert & Oriented x 3   Doristine Locks, DO [facility]   07/06/2023 2:17 PM

## 2023-07-06 NOTE — Progress Notes (Signed)
 A/o x 3, VSS, gd SR's, pleased with anesthesia, report to RN

## 2023-07-06 NOTE — Patient Instructions (Addendum)
Resume previous diet Continue present medications Await pathology results  Handouts/information given for diverticulosis and hemorrhoids   YOU HAD AN ENDOSCOPIC PROCEDURE TODAY AT THE [facility]:   Refer to the procedure report that was given to you for any specific questions about what was found during the examination.  If the procedure report does not answer your questions, please call your gastroenterologist to clarify.  If you requested that your care partner not be given the details of your procedure findings, then the procedure report has been included in a sealed envelope for you to review at your convenience later.  YOU SHOULD EXPECT: Some feelings of bloating in the abdomen. Passage of more gas than usual.  Walking can help get rid of the air that was put into your GI tract during the procedure and reduce the bloating. If you had a lower endoscopy (such as a colonoscopy or flexible sigmoidoscopy) you may notice spotting of blood in your stool or on the toilet paper. If you underwent a bowel prep for your procedure, you may not have a normal bowel movement for a few days.  Please Note:  You might notice some irritation and congestion in your nose or some drainage.  This is from the oxygen used during your procedure.  There is no need for concern and it should clear up in a day or so.  SYMPTOMS TO REPORT IMMEDIATELY:  Following lower endoscopy (colonoscopy or flexible sigmoidoscopy):  Excessive amounts of blood in the stool  Significant tenderness or worsening of abdominal pains  Swelling of the abdomen that is new, acute  Fever of 100F or higher  For urgent or emergent issues, a gastroenterologist can be reached at any hour by calling [phone]. Do not use MyChart messaging for urgent concerns.    DIET:  We do recommend a small meal at first, but then you may proceed to your regular diet.  Drink plenty of fluids but you should avoid alcoholic beverages for 24  hours.  ACTIVITY:  You should plan to take it easy for the rest of today and you should NOT DRIVE or use heavy machinery until tomorrow (because of the sedation medicines used during the test).    FOLLOW UP: Our staff will call the number listed on your records the next business day following your procedure.  We will call around 7:15- 8:00 am to check on you and address any questions or concerns that you may have regarding the information given to you following your procedure. If we do not reach you, we will leave a message.     If any biopsies were taken you will be contacted by phone or by letter within the next 1-3 weeks.  Please call us at [phone] if you have not heard about the biopsies in 3 weeks.    SIGNATURES/CONFIDENTIALITY: You and/or your care partner have signed paperwork which will be entered into your electronic medical record.  These signatures attest to the fact that that the information above on your After Visit Summary has been reviewed and is understood.  Full responsibility of the confidentiality of this discharge information lies with you and/or your care-partner.

## 2023-07-06 NOTE — Op Note (Signed)
[facility] Patient Name: Angela Pugh Procedure Date: 07/06/2023 2:11 PM MRN: [id number] Endoscopist: Doristine Locks , MD, [id number] Age: 78 Referring MD:  Date of Birth: 05/16/1946 Gender: Female Account #: [id number] Procedure:                Colonoscopy Indications:              Generalized abdominal pain, Change in bowel habits,                            History of segmental colitis Medicines:                Monitored Anesthesia Care Procedure:                Pre-Anesthesia Assessment:                           - Prior to the procedure, a History and Physical                            was performed, and patient medications and                            allergies were reviewed. The patient's tolerance of                            previous anesthesia was also reviewed. The risks                            and benefits of the procedure and the sedation                            options and risks were discussed with the patient.                            All questions were answered, and informed consent                            was obtained. Prior Anticoagulants: The patient has                            taken no anticoagulant or antiplatelet agents. ASA                            Grade Assessment: III - A patient with severe                            systemic disease. After reviewing the risks and                            benefits, the patient was deemed in satisfactory                            condition to undergo the procedure.  After obtaining informed consent, the colonoscope                            was passed under direct vision. Throughout the                            procedure, the patient's blood pressure, pulse, and                            oxygen saturations were monitored continuously. The                            Olympus Scope [id number] was introduced through the                            anus and advanced to the  the cecum, identified by                            appendiceal orifice and ileocecal valve. The                            colonoscopy was technically difficult and complex                            due to restricted mobility of the colon. The                            patient tolerated the procedure well. The quality                            of the bowel preparation was fair. The ileocecal                            valve, appendiceal orifice, and rectum were                            photographed. Scope In: 2:34:20 PM Scope Out: 2:50:59 PM Scope Withdrawal Time: 0 hours 6 minutes 24 seconds  Total Procedure Duration: 0 hours 16 minutes 39 seconds  Findings:                 The perianal and digital rectal examinations were                            normal.                           Multiple large-mouthed and small-mouthed                            diverticula were found in the sigmoid colon. There                            was mucosal hypertophy and significantly restricted  mobility in an area of dense diverticulosis in the                            sigmoid colon. Biopsies were taken with a cold                            forceps for histology. Estimated blood loss was                            minimal.                           The sigmoid colon was significantly tortuous with                            restricted mobility.                           A moderate amount of stool was found in the entire                            colon, interfering with visualization. Lavage of                            the area was performed using copious amounts of                            sterile water, resulting in incomplete clearance                            with fair visualization. Cannot rule out the                            presence of polyps or flat lesions on this study.                           Non-bleeding internal hemorrhoids were found during                             anoscopy. The hemorrhoids were small. Retroflexion                            not performed due to narrowed, short rectal vault                            and significant retained stool in the rectal vault. Complications:            No immediate complications. Estimated Blood Loss:     Estimated blood loss was minimal. Impression:               - Preparation of the colon was fair.                           - Diverticulosis in the sigmoid colon with  localized area of mucosal hypertrophy. Biopsied.                           - Tortuous colon.                           - Stool in the entire examined colon.                           - Non-bleeding internal hemorrhoids. Recommendation:           - Patient has a contact number available for                            emergencies. The signs and symptoms of potential                            delayed complications were discussed with the                            patient. Return to normal activities tomorrow.                            Written discharge instructions were provided to the                            patient.                           - Resume previous diet.                           - Continue present medications.                           - Await pathology results.                           - Repeat colonoscopy for surveillance based on                            pathology results.                           - If planning repeat colonoscopy, I recommend using                            an Ultraslim colonoscope. Doristine Locks, MD 07/06/2023 2:59:44 PM

## 2023-07-06 NOTE — Progress Notes (Signed)
 Pt's states no medical or surgical changes since previsit or office visit.

## 2023-07-06 NOTE — Progress Notes (Signed)
 Called to room to assist during endoscopic procedure.  Patient ID and intended procedure confirmed with present staff. Received instructions for my participation in the procedure from the performing physician.

## 2023-07-07 ENCOUNTER — Telehealth: Payer: Self-pay

## 2023-07-07 NOTE — Telephone Encounter (Signed)
  Follow up Call-     07/06/2023    2:14 PM 07/09/2022    1:52 PM 02/03/2022    2:05 PM  Call back number  Post procedure Call Back phone  # [phone]  Permission to leave phone message Yes Yes Yes     Patient questions:  Do you have a fever, pain , or abdominal swelling? No. Pain Score  0 *  Have you tolerated food without any problems? Yes  Have you been able to return to your normal activities? Yes  Do you have any questions about your discharge instructions: Diet   No. Medications  No. Follow up visit  No.  Do you have questions or concerns about your Care? No.  Actions: * If pain score is 4 or above: No action needed, pain <4.

## 2023-07-09 ENCOUNTER — Ambulatory Visit: Payer: Medicare Other | Admitting: Physician Assistant

## 2023-07-09 LAB — SURGICAL PATHOLOGY

## 2023-07-12 ENCOUNTER — Ambulatory Visit: Payer: Medicare Other

## 2023-07-13 ENCOUNTER — Ambulatory Visit
Admission: RE | Admit: 2023-07-13 | Discharge: 2023-07-13 | Discharge status: Home / Self Care | Payer: Medicare Other | Source: Ambulatory Visit | Attending: Family Medicine

## 2023-07-13 DIAGNOSIS — Z1231 Encounter for screening mammogram for malignant neoplasm of breast: Secondary | ICD-10-CM

## 2023-07-29 ENCOUNTER — Ambulatory Visit: Payer: Self-pay | Admitting: Family Medicine

## 2023-07-29 DIAGNOSIS — H00022 Hordeolum internum right lower eyelid: Secondary | ICD-10-CM | POA: Diagnosis not present

## 2023-07-29 NOTE — Telephone Encounter (Signed)
 Patient has an appt with Dr. Salomon Fick 3/14

## 2023-07-29 NOTE — Telephone Encounter (Signed)
 Copied from CRM [id number]. Topic: Clinical - Red Word Triage >> Jul 29, 2023  9:06 AM Angela Pugh wrote: Red Word that prompted transfer to Nurse Triage: Pain from behind her her that radiating down to her neck, patient states that she has a sinus infection and was also treated for one about 1-2 months ago by Dr. Salomon Fick   Chief Complaint: Possible sinusitis & also Neck Pain & some dizziness when standing too fast Symptoms: neck pain, slight dizziness off and on only when standing too fast, loss of appetite,  Frequency: x 4 days Pertinent Negatives: Patient denies fever, chest pain, vomiting, diarrhea, bleeding, headache, neck swelling Disposition: [] ED /[] Urgent Care (no appt availability in office) / [x] Appointment(In office/virtual)/ []  [facility]/ [] Home Care/ [] Refused Recommended Disposition /[] [facility]/ []  Follow-up with PCP Additional Notes: Patient called and advised that she recently had an eye infection.  She went to the Emergency Room around the beginning of Feb for neck pain and was treated.   Patient states now she is having some pain on the right side of her neck. Patient has been taking allergy medicine and it isn't as bad as it was. Patient saw her eye doctor today for an appointment for follow up of a recent eye infection. She denies any fever, chest pain, vomiting, diarrhea, bleeding, headache, or neck swelling at this time. Patient states she has been outside and noticed that the pollen may be causing her some issues with eyes and ears and sinuses. Patient states that after taking allergy medicine, her pain in her right side of her next near her ear got a lot better. She also states that for less than a week now she has started having some dizziness/light headedness that is only there when she stands up suddenly and goes away quickly. Appointment made for tomorrow 07/30/2023 at 2:30 pm with patient's PCP. Patient is advised that if anything gets worse to  go to the Emergency Room.  Patient verbalized understanding and added that if she needed to she would call 911 for an ambulance to take her to the Emergency Room if she needs to.    Reason for Disposition . [1] MODERATE dizziness (e.g., interferes with normal activities) AND [2] has NOT been evaluated by doctor (or NP/PA) for this  (Exception: Dizziness caused by heat exposure, sudden standing, or poor fluid intake.) . [1] MODERATE neck pain (e.g., interferes with normal activities) AND [2] present > 3 days  Answer Assessment - Initial Assessment Questions 1. DESCRIPTION: "Describe your dizziness."     Light headed when standing up too fast 2. LIGHTHEADED: "Do you feel lightheaded?" (e.g., somewhat faint, woozy, weak upon standing)     Just when standing up quickly 3. VERTIGO: "Do you feel like either you or the room is spinning or tilting?" (i.e. vertigo)     no 4. SEVERITY: "How bad is it?"  "Do you feel like you are going to faint?" "Can you stand and walk?"   - MILD: Feels slightly dizzy, but walking normally.   - MODERATE: Feels unsteady when walking, but not falling; interferes with normal activities (e.g., school, work).   - SEVERE: Unable to walk without falling, or requires assistance to walk without falling; feels like passing out now.      She has to get her balance first 5. ONSET:  "When did the dizziness begin?"     Less than a week ago 6. AGGRAVATING FACTORS: "Does anything make it worse?" (e.g., standing, change  in head position)     Standing up too fast 7. HEART RATE: "Can you tell me your heart rate?" "How many beats in 15 seconds?"  (Note: not all patients can do this)       Didn't ask 8. CAUSE: "What do you think is causing the dizziness?"     unknown 9. RECURRENT SYMPTOM: "Have you had dizziness before?" If Yes, ask: "When was the last time?" "What happened that time?"     no 10. OTHER SYMPTOMS: "Do you have any other symptoms?" (e.g., fever, chest pain, vomiting,  diarrhea, bleeding)       Pain just below ear and down neck a little bit  Answer Assessment - Initial Assessment Questions 1. ONSET: "When did the pain begin?"      4 days ago 2. LOCATION: "Where does it hurt?"      Right side of neck just behind/under right ear 3. PATTERN "Does the pain come and go, or has it been constant since it started?"      constant 4. SEVERITY: "How bad is the pain?"  (Scale 1-10; or mild, moderate, severe)   - NO PAIN (0): no pain or only slight stiffness    - MILD (1-3): doesn't interfere with normal activities    - MODERATE (4-7): interferes with normal activities or awakens from sleep    - SEVERE (8-10):  excruciating pain, unable to do any normal activities      5 5. RADIATION: "Does the pain go anywhere else, shoot into your arms?"     Down neck 6. CORD SYMPTOMS: "Any weakness or numbness of the arms or legs?"     no 7. CAUSE: "What do you think is causing the neck pain?"     Not sure maybe sinus issues 8. NECK OVERUSE: "Any recent activities that involved turning or twisting the neck?"     No 9. OTHER SYMPTOMS: "Do you have any other symptoms?" (e.g., headache, fever, chest pain, difficulty breathing, neck swelling)     no  Protocols used: Dizziness - Lightheadedness-A-AH, Neck Pain or Stiffness-A-AH

## 2023-07-30 ENCOUNTER — Ambulatory Visit (INDEPENDENT_AMBULATORY_CARE_PROVIDER_SITE_OTHER): Admitting: Family Medicine

## 2023-07-30 ENCOUNTER — Other Ambulatory Visit: Payer: Self-pay | Admitting: Physician Assistant

## 2023-07-30 ENCOUNTER — Encounter: Payer: Self-pay | Admitting: Family Medicine

## 2023-07-30 VITALS — BP 138/76 | HR 72 | Temp 98.4°F | Ht 68.0 in | Wt 159.6 lb

## 2023-07-30 DIAGNOSIS — L720 Epidermal cyst: Secondary | ICD-10-CM

## 2023-07-30 DIAGNOSIS — J310 Chronic rhinitis: Secondary | ICD-10-CM | POA: Diagnosis not present

## 2023-07-30 DIAGNOSIS — H6991 Unspecified Eustachian tube disorder, right ear: Secondary | ICD-10-CM

## 2023-07-30 NOTE — Progress Notes (Signed)
 Established Patient Office Visit   Subjective  Patient ID: Angela Pugh, female    DOB: 1945-08-04  Age: 78 y.o. MRN: [id number]  Chief Complaint  Patient presents with   Sinusitis   Neck Pain   Dizziness    Patient is a 78 year old female seen for ongoing concern.  Patient endorses continued right sided neck pain and stiffness into the shoulder, occasional dizziness, decreased appetite.  Patient denies pain/pressure of face, headaches, ear pain/pressure, muffled/decreased hearing patient states symptoms seem to improve after starting Claritin a few days ago.  Concerned about sinus infection.    Patient Active Problem List   Diagnosis Date Noted   Diverticulosis 04/01/2023   Constipation 02/18/2023   Radiculopathy, lumbar region 12/08/2022   Spondylolisthesis, lumbar region 12/08/2022   Headache above the eye region 02/19/2021   Nasal congestion 02/19/2021   Emphysema lung (HCC) 10/04/2020   Abdominal pain, epigastric 03/18/2020   Body mass index (BMI) 26.0-26.9, adult 10/23/2019   Nonischemic cardiomyopathy (HCC) 04/12/2018   Chest pain 01/25/2018   Left bundle branch block 01/25/2018   Carotid artery disease (HCC) 01/25/2018   Seasonal allergies 10/18/2017   Arthritis 10/18/2017   Pre-diabetes 10/18/2017   Protein-calorie malnutrition, severe 05/05/2016   Dysphagia 05/04/2016   Displacement of cervical intervertebral disc without myelopathy 03/18/2016   Carpal tunnel syndrome 02/10/2016   Cervical spondylosis with radiculopathy 02/10/2016   Abdominal pain 02/01/2016   Acute diverticulitis 02/01/2016   HLD (hyperlipidemia) 02/01/2016   Diabetes mellitus without complication (HCC) 02/01/2016   Hypokalemia 02/01/2016   Hypertension    Cervical spondylosis without myelopathy 01/13/2016   Bilateral carpal tunnel syndrome 01/13/2016   Herniated lumbar intervertebral disc 06/30/2013    Class: Acute   Low back pain 02/06/2013   Past Medical History:  Diagnosis  Date   Arthritis    back, knees, shoulder, hands , injections in pelvis, for bone spurs (05/04/2016)   Chronic back pain    Diverticulosis    Dysrhythmia    LBBB   GERD (gastroesophageal reflux disease)    History of kidney stones    HLD (hyperlipidemia)    Hypertension    Migraine    hx (05/04/2016)   Peripheral neuropathy    Radiculopathy    Sinus complaint    Sinus headache    occasional (05/04/2016)   Type II diabetes mellitus (HCC)    diet controlled, no meds now, was on insulin & po treatment at one time     Vertigo    Past Surgical History:  Procedure Laterality Date   ANTERIOR CERVICAL DECOMP/DISCECTOMY FUSION N/A 04/17/2016   Procedure: ANTERIOR CERVICAL DECOMPRESSION/DISCECTOMY FUSION CERVICAL THREE - CERVICAL FOUR, POSTERIOR CERVICAL DISCECTOMY CERVICAL SEVEN -THORASIC ONE LEFT;  Surgeon: Coletta Memos, MD;  Location: [facility] OR;  Service: Neurosurgery;  Laterality: N/A;  Anterior/Posterior   ANTERIOR FUSION CERVICAL SPINE  2000   Dr. Channing Mutters; "for bone spurs; put hardware in too"   BACK SURGERY N/A 12/08/2022   COLONOSCOPY  06/10/2016   [facility]   COLONOSCOPY  2023   LASIK Bilateral    LEFT HEART CATH AND CORONARY ANGIOGRAPHY N/A 03/17/2018   Procedure: LEFT HEART CATH AND CORONARY ANGIOGRAPHY;  Surgeon: Runell Gess, MD;  Location: [facility] INVASIVE CV LAB;  Service: Cardiovascular;  Laterality: N/A;   LUMBAR LAMINECTOMY Left 06/30/2013   Procedure: Left L3-4 Extraforaminal approach to excise far lateral herniated nucleus pulposus;  Surgeon: Kerrin Champagne, MD;  Location: [facility] OR;  Service: Orthopedics;  Laterality: Left;  TUBAL LIGATION     VAGINAL HYSTERECTOMY     Social History   Tobacco Use   Smoking status: Some Days    Current packs/day: 0.00    Average packs/day: 0.5 packs/day for 20.0 years (10.0 ttl pk-yrs)    Types: Cigarettes    Start date: 06/30/1962    Last attempt to quit: 06/30/1982    Years since quitting: 41.1   Smokeless tobacco: Never   Tobacco  comments:    Patient only smokes in social setting when drinking. Admits former h/o heavy smoking years ago smoked 1.5 packs/day.    12/04/2021 Patient states she has not spoke for about 3 weeks    12/01/2022: pt will have a few drags if she is having a drink  Vaping Use   Vaping status: Never Used  Substance Use Topics   Alcohol use: Yes    Comment: special occasion   Drug use: No   Family History  Problem Relation Age of Onset   Uterine cancer Mother    Diabetes type II Sister    Diabetes Mellitus II Brother    Diverticulitis Daughter    Heart Problems Daughter    SIDS Son    Colon cancer Neg Hx    Pancreatic cancer Neg Hx    Esophageal cancer Neg Hx    Colon polyps Neg Hx    Rectal cancer Neg Hx    Stomach cancer Neg Hx    Allergies  Allergen Reactions   Symbicort [Budesonide-Formoterol Fumarate] Shortness Of Breath, Swelling and Other (See Comments)    Pharyngeal edema, hoarseness, tightness in chest, lips became dark.   Acetaminophen Hives and Nausea Only   Azithromycin Hives and Nausea And Vomiting   Lipitor [Atorvastatin] Nausea Only    weakness   Pregabalin Nausea And Vomiting    dizziness   Zyrtec [Cetirizine] Hives      ROS Negative unless stated above    Objective:     BP 138/76 (BP Location: Left Arm, Patient Position: Sitting, Cuff Size: Normal)   Pulse 72   Temp 98.4 F (36.9 C) (Oral)   Ht 5\' 8"  (1.727 m)   Wt 159 lb 9.6 oz (72.4 kg)   SpO2 97%   BMI 24.27 kg/m  BP Readings from Last 3 Encounters:  07/30/23 138/76  07/06/23 (!) 148/66  06/24/23 (!) 162/100   Wt Readings from Last 3 Encounters:  07/30/23 159 lb 9.6 oz (72.4 kg)  07/06/23 176 lb (79.8 kg)  05/25/23 176 lb (79.8 kg)      Physical Exam Constitutional:      General: She is not in acute distress.    Appearance: Normal appearance.  HENT:     Head: Normocephalic and atraumatic.     Nose: Nose normal.     Mouth/Throat:     Mouth: Mucous membranes are moist.   Cardiovascular:     Rate and Rhythm: Normal rate and regular rhythm.     Heart sounds: Normal heart sounds. No murmur heard.    No gallop.  Pulmonary:     Effort: Pulmonary effort is normal. No respiratory distress.     Breath sounds: Normal breath sounds. No wheezing, rhonchi or rales.  Skin:    General: Skin is warm and dry.     Comments: 1 cm cyst of posterior R ear.  Neurological:     Mental Status: She is alert and oriented to person, place, and time.    Incision and Drainage Procedure Note  Pre-operative Diagnosis:  Cyst  Post-operative Diagnosis: same  Indications: Discomfort  Anesthesia: 2% plain lidocaine  Procedure Details  The procedure, risks and complications have been discussed in detail (including, but not limited to airway compromise, infection, bleeding) with the patient, and the patient has signed consent to the procedure.  The skin was sterilely prepped and draped over the affected area in the usual fashion. After adequate local anesthesia .5cc 2% plain lidocaine, I&D with a #11 blade was performed on the right posterior ear. Purulent drainage: present The patient was observed until stable.  Findings: Epidermoid cyst  EBL: minimal cc's   Condition: Tolerated procedure well and Stable   Complications: none.    02/19/2023    1:59 PM 02/12/2023    8:46 AM 02/10/2022    8:21 AM  Depression screen PHQ 2/9  Decreased Interest 0 0 0  Down, Depressed, Hopeless 0 0 0  PHQ - 2 Score 0 0 0  Altered sleeping 0 1   Tired, decreased energy 0 1   Change in appetite 0 0   Feeling bad or failure about yourself  0 0   Trouble concentrating 0 0   Moving slowly or fidgety/restless 0 0   Suicidal thoughts 0 0   PHQ-9 Score 0 2   Difficult doing work/chores Not difficult at all Not difficult at all       02/12/2023    8:48 AM  GAD 7 : Generalized Anxiety Score  Nervous, Anxious, on Edge 0  Control/stop worrying 0  Worry too much - different things 0   Trouble relaxing 0  Restless 0  Easily annoyed or irritable 0  Afraid - awful might happen 0  Total GAD 7 Score 0  Anxiety Difficulty Not difficult at all    No results found for any visits on 07/30/23.    Assessment & Plan:  Dysfunction of right eustachian tube  Epidermoid cyst of ear  Chronic rhinitis  Patient seen for ongoing right-sided neck pain.  Symptoms likely 2/2 eustachian dysfunction due to chronic rhinitis.  Patient advised to start p.o. antihistamine such as Claritin and consider nasal spray such as saline nasal rinse, Flonase, etc.  For worsening symptoms follow-up with ENT.  Consent obtained.  Cyst on posterior right ear I&D.  Patient tolerated procedure well.  Given precautions.  Return if symptoms worsen or fail to improve.   [name], MD

## 2023-08-02 ENCOUNTER — Other Ambulatory Visit: Payer: Self-pay | Admitting: Physician Assistant

## 2023-08-02 ENCOUNTER — Encounter: Payer: Self-pay | Admitting: Primary Care

## 2023-08-02 ENCOUNTER — Ambulatory Visit: Payer: Medicare Other | Admitting: Primary Care

## 2023-08-02 VITALS — BP 123/77 | HR 60 | Temp 96.6°F | Wt 158.8 lb

## 2023-08-02 DIAGNOSIS — J438 Other emphysema: Secondary | ICD-10-CM | POA: Diagnosis not present

## 2023-08-02 DIAGNOSIS — R1013 Epigastric pain: Secondary | ICD-10-CM

## 2023-08-02 NOTE — Patient Instructions (Addendum)
 -EMPHYSEMA: Emphysema is a lung condition that causes shortness of breath due to damage to the air sacs in the lungs, often from smoking. Your CT scan showed mild emphysema, but your lung function tests are normal, and your symptoms are not significantly affecting your daily activities. We will monitor your symptoms, and if your shortness of breath worsens or starts to limit your activities, we may consider prescribing a Spiriva inhaler. It is important to continue avoiding smoking.  -ALLERGIC RHINITIS: Allergic rhinitis is an allergic reaction that causes sneezing, congestion, and sinus pressure, often due to pollen. Your symptoms have improved with your current allergy medication. We discussed the option of adding Flonase for additional relief if needed. Please continue taking your allergy medication as prescribed.  -GASTROESOPHAGEAL REFLUX DISEASE (GERD): GERD is a digestive disorder where stomach acid frequently flows back into the tube connecting your mouth and stomach, causing irritation. You have occasional cough and wheezing possibly related to reflux. Continue taking Protonix as prescribed and follow lifestyle changes such as avoiding fatty foods, alcohol, chocolate, caffeine, and not eating three hours before bedtime.  Follow-up: Routine follow-up is not necessary, return if breathing worsens    GERD in Adults: What to Know  Gastroesophageal reflux (GER) is when acid from your stomach flows up into your esophagus. Your esophagus is the part of your body that moves food from your mouth to your stomach. Normally, food goes down and stays in your stomach to be digested. But with GER, food and stomach acid may go back up. You may have a disease called gastroesophageal reflux disease (GERD) if the reflux: Happens often. Causes very bad symptoms. Makes your esophagus sore and swollen. Over time, GERD can make small holes called ulcers in the lining of your esophagus. What are the  causes? GERD is caused by a problem with the muscle between your esophagus and stomach. This muscle is called the lower esophageal sphincter (LES). When it's weak or not normal, it doesn't close like it should. This means food and stomach acid can go back up into your esophagus. The muscle can be weak if: You smoke or use products with tobacco in them. You're pregnant. You have a type of hernia called a hiatal hernia. You eat certain foods and drinks. These include: Alcohol. Coffee. Chocolate. Onions. Peppermint. What increases the risk? Being overweight. Having a disease that affects your connective tissue. Taking NSAIDs, such as ibuprofen. What are the signs or symptoms? Heartburn. Trouble swallowing. Pain when you swallow. The feeling of having a lump in your throat. A bitter taste in your mouth. Bad breath. Having an upset or bloated stomach. Burping. Chest pain. Other conditions can also cause chest pain. Make sure you see your health care provider if you have chest pain. Wheezing. This is when you make high-pitched whistling sounds when you breathe, most often when you breathe out. A long-term cough or a cough at night. How is this diagnosed? GERD may be diagnosed based on your medical history and a physical exam. You may also have tests. These may include: An endoscopy. This test looks at your stomach and esophagus with a small camera. A barium swallow test. This shows the shape and size of your esophagus and how well it's working. Tests of your esophagus to check for: Acid levels. Pressure. How is this treated? Treatment may depend on how bad your symptoms are. It may include: Changes to your diet and daily life. Medicines. Surgery. Follow these instructions at home: Eating and drinking  Follow an eating plan as told by your provider. You may need to avoid certain foods and drinks. These may include: Coffee and tea, with or without caffeine. Alcohol. Energy drinks  and sports drinks. Fizzy drinks or sodas. Chocolate and cocoa. Peppermint and mint flavorings. Garlic and onions. Horseradish. Spicy and acidic foods. These include: Peppers. Chili powder and curry powder. Vinegar. Hot sauces and BBQ sauce. Citrus fruits and juices. These include: Oranges. Lemons. Limes. Tomato-based foods. These include: Red sauce and pizza with red sauce. Chili. Salsa. Fried and fatty foods. These include: Donuts. [place] fries. Potato chips. High-fat dressings. High-fat meats. These include: Hot dogs and sausage. Rib eye steak. Ham and bacon. High-fat dairy items. These include: Whole milk. Butter. Cream cheese. Eat small meals often. Avoid eating big meals. Avoid drinking lots of liquid with your meals. Try not to eat meals during the 2-3 hours before bedtime. Try not to lie down right after you eat. Do not exercise right after you eat. Lifestyle  If you're overweight, lose an amount of weight that's healthy for you. Ask your provider about a safe weight loss goal. Do not smoke, vape, or use nicotine or tobacco. Wear loose clothes. Do not wear things that are tight around your waist. When you sleep, try: Raising the head of your bed about 6 inches (15 cm). You can use a wedge to do this. Lying down on your left side. Try to lower your stress. If you need help doing this, ask your provider. General instructions Take your medicines only as told. Do not take aspirin or ibuprofen unless you're told to. Watch for any changes in your symptoms. Do not bend over if it makes your symptoms worse. Contact a health care provider if: You have new symptoms. You have trouble: Drinking. Swallowing. Eating. It hurts to swallow. You have wheezing. You have a cough that won't go away. Your voice is hoarse. Your symptoms don't get better with treatment. Get help right away if: You have pain all of a sudden in your: Arm. Neck. Jaw. Teeth. Back. You  feel sweaty, dizzy, or light-headed all of a sudden. You faint. You have chest pain or shortness of breath. You vomit and the vomit is: Green, yellow, or black. Looks like blood or coffee grounds. Your poop is red, bloody, or black. These symptoms may be an emergency. Call 911 right away. Do not wait to see if the symptoms will go away. Do not drive yourself to the hospital. This information is not intended to replace advice given to you by your health care provider. Make sure you discuss any questions you have with your health care provider. Document Revised: 03/16/2023 Document Reviewed: 09/30/2022 Elsevier Patient Education  2024 [employer].

## 2023-08-02 NOTE — Progress Notes (Signed)
 @Patient  ID: Angela Pugh, female    DOB: 05/20/1945, 78 y.o.   MRN: [id number]  Chief Complaint  Patient presents with   Follow-up    Referring provider: Marcelino Duster, PA  HPI: 78 year old female, prior smoker history (quit 30 years ago). PMH significant emphysema, CAD, nonischemic cardiomyopathy, diverticulosis, diabetes, carpal tunnel syndrome, HLD, seasonal allergies. Former patient of Dr. Kendrick Fries.   08/02/2023 Discussed the use of AI scribe software for clinical note transcription with the patient, who gave verbal consent to proceed.  History of Present Illness   Angela Pugh is a 78 year old female who presents with shortness of breath and mild emphysema.  She experiences shortness of breath infrequently, sometimes when transitioning from sitting to standing, but it resolves quickly and does not significantly impact her quality of life. She was evaluated in August 2023 for shortness of breath, dry cough, and wheezing, with normal spirometry and walking oximetry tests. A chest x-ray was also normal. A CT scan in 2024 of the chest showed mild emphysema, attributed to her past smoking history. Despite this finding, her lung function tests were normal, and she does not feel limited by her breathing.  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 She is currently taking Protonix for reflux. She experiences occasional vertigo, which led her to temporarily stop water aerobics, but she plans to resume it.  Recently, she experienced neck pain, which she associates with sinus and allergy issues. She took an allergy pill prescribed by her regular doctor, which alleviated the neck pain. She notes that tree pollen was high recently, which may have contributed to her symptoms.     Allergies  Allergen Reactions   Symbicort [Budesonide-Formoterol Fumarate] Shortness Of  Breath, Swelling and Other (See Comments)    Pharyngeal edema, hoarseness, tightness in chest, lips became dark.   Acetaminophen Hives and Nausea Only   Azithromycin Hives and Nausea And Vomiting   Lipitor [Atorvastatin] Nausea Only    weakness   Pregabalin Nausea And Vomiting    dizziness   Zyrtec [Cetirizine] Hives    Immunization History  Administered Date(s) Administered   Fluad Quad(high Dose 65+) 02/10/2022   Fluad Trivalent(High Dose 65+) 02/12/2023   PFIZER(Purple Top)SARS-COV-2 Vaccination 06/24/2019, 07/15/2019, 02/27/2020   PNEUMOCOCCAL CONJUGATE-20 09/10/2022   Rsv, Bivalent, Protein Subunit Rsvpref,pf Verdis Frederickson) 09/10/2022   Tdap 09/10/2022   Zoster Recombinant(Shingrix) 09/10/2022    Past Medical History:  Diagnosis Date   Arthritis    back, knees, shoulder, hands , injections in pelvis, for bone spurs (05/04/2016)   Chronic back pain    Diverticulosis    Dysrhythmia    LBBB   GERD (gastroesophageal reflux disease)    History of kidney stones    HLD (hyperlipidemia)    Hypertension    Migraine    hx (05/04/2016)   Peripheral neuropathy    Radiculopathy    Sinus complaint    Sinus headache    occasional (05/04/2016)   Type II diabetes mellitus (HCC)    diet controlled, no meds now, was on insulin & po treatment at one time     Vertigo     Tobacco History: Social History   Tobacco Use  Smoking Status Some Days   Current packs/day: 0.00   Average packs/day: 0.5 packs/day for 20.0 years (10.0 ttl pk-yrs)   Types: Cigarettes   Start date: 06/30/1962   Last  attempt to quit: 06/30/1982   Years since quitting: 41.1  Smokeless Tobacco Never  Tobacco Comments   Patient only smokes in social setting when drinking. Admits former h/o heavy smoking years ago smoked 1.5 packs/day.   12/04/2021 Patient states she has not spoke for about 3 weeks   12/01/2022: pt will have a few drags if she is having a drink   Ready to quit: Not Answered Counseling given: Not  Answered Tobacco comments: Patient only smokes in social setting when drinking. Admits former h/o heavy smoking years ago smoked 1.5 packs/day. 12/04/2021 Patient states she has not spoke for about 3 weeks 12/01/2022: pt will have a few drags if she is having a drink   Outpatient Medications Prior to Visit  Medication Sig Dispense Refill   ABRYSVO 120 MCG/0.5ML injection      amLODipine (NORVASC) 5 MG tablet TAKE 1 TABLET(5 MG) BY MOUTH DAILY 90 tablet 0   aspirin 81 MG EC tablet Take 81 mg by mouth daily. Swallow whole.     Aspirin-Acetaminophen-Caffeine (GOODY HEADACHE PO) Take 1 packet by mouth daily as needed (headaches).     Blood Glucose Monitoring Suppl DEVI 1 each by Does not apply route in the morning, at noon, and at bedtime. May substitute to any manufacturer covered by patient's insurance. 1 each 0   Calcium Carb-Cholecalciferol (CALCIUM 500 + D PO) Take 1 tablet by mouth daily.     clobetasol ointment (TEMOVATE) 0.05 % Apply 1 Application topically 2 (two) times daily. (Patient taking differently: Apply 1 Application topically 2 (two) times daily as needed (rash).) 30 g 0   cyclobenzaprine (FLEXERIL) 10 MG tablet Take 1 tablet (10 mg total) by mouth 2 (two) times daily as needed for muscle spasms. 20 tablet 0   dicyclomine (BENTYL) 10 MG capsule Take 1 capsule (10 mg total) by mouth 4 (four) times daily as needed for spasms (for abdominal pain). 90 capsule 3   ferrous sulfate 325 (65 FE) MG EC tablet Take 325 mg by mouth daily.     Glucose Blood (BLOOD GLUCOSE TEST STRIPS) STRP 1 each by In Vitro route in the morning, at noon, and at bedtime. May substitute to any manufacturer covered by patient's insurance. 100 strip 11   guaiFENesin (MUCINEX) 600 MG 12 hr tablet Take by mouth 2 (two) times daily.     Lancets Misc. MISC 1 each by Does not apply route in the morning, at noon, and at bedtime. May substitute to any manufacturer covered by patient's insurance. 100 each 11   meclizine  (ANTIVERT) 25 MG tablet Take 1 tablet (25 mg total) by mouth 3 (three) times daily as needed for dizziness. 20 tablet 1   mesalamine (LIALDA) 1.2 g EC tablet TAKE 4 TABLETS(4.8 GRAMS) BY MOUTH DAILY WITH BREAKFAST 360 tablet 0   Multiple Vitamins-Minerals (CENTRUM ADULTS PO) Take 1 tablet by mouth daily.      NON FORMULARY Diltiazem 2%/Lidocaine5% compound Use 3 x rectally daily for 2 months to heal anal fissure 30 g 1   olmesartan (BENICAR) 5 MG tablet TAKE 2 TABLETS BY MOUTH DAILY 180 tablet 0   Omega-3 Fatty Acids (FISH OIL PO) Take 700 mg by mouth daily.     pantoprazole (PROTONIX) 40 MG tablet TAKE 1 TABLET(40 MG) BY MOUTH TWICE DAILY BEFORE A MEAL 180 tablet 3   potassium chloride SA (KLOR-CON M) 20 MEQ tablet Take 2 tablets (40 mEq total) by mouth daily. 180 tablet 1   PREDNISOLON-MOXIFLOX-BROMFENAC OP Place  1 drop into both eyes daily as needed (irritation).     rosuvastatin (CRESTOR) 40 MG tablet Take 1 tablet (40 mg total) by mouth daily. 90 tablet 0   sucralfate (CARAFATE) 1 g tablet TAKE 1 TABLET(1 GRAM) BY MOUTH FOUR TIMES DAILY- WITH EACH MEAL AND AT BEDTIME 360 tablet 0   triamcinolone cream (KENALOG) 0.5 % Apply topically 2 (two) times daily. 30 g 3   vitamin B-12 (CYANOCOBALAMIN) 1000 MCG tablet Take 1,000 mcg by mouth daily.     Facility-Administered Medications Prior to Visit  Medication Dose Route Frequency Provider Last Rate Last Admin   diclofenac Sodium (VOLTAREN) 1 % topical gel 2 g  2 g Topical PRN        Review of Systems  Review of Systems  Constitutional: Negative.   HENT: Negative.    Respiratory: Negative.    Cardiovascular: Negative.    Physical Exam  BP 123/77 (BP Location: Left Arm, Patient Position: Sitting, Cuff Size: Normal)   Pulse 60   Temp (!) 96.6 F (35.9 C) (Temporal)   Wt 158 lb 12.8 oz (72 kg)   SpO2 97%   BMI 24.15 kg/m  Physical Exam Constitutional:      General: She is not in acute distress.    Appearance: Normal appearance. She is  not ill-appearing.  HENT:     Head: Normocephalic and atraumatic.     Mouth/Throat:     Mouth: Mucous membranes are moist.     Pharynx: Oropharynx is clear.  Cardiovascular:     Rate and Rhythm: Normal rate and regular rhythm.  Pulmonary:     Effort: Pulmonary effort is normal.     Breath sounds: Normal breath sounds. No wheezing, rhonchi or rales.  Musculoskeletal:        General: Normal range of motion.  Skin:    General: Skin is warm and dry.  Neurological:     General: No focal deficit present.     Mental Status: She is alert and oriented to person, place, and time. Mental status is at baseline.  Psychiatric:        Mood and Affect: Mood normal.        Behavior: Behavior normal.        Thought Content: Thought content normal.        Judgment: Judgment normal.      Lab Results:  CBC    Component Value Date/Time   WBC 7.9 06/20/2023 1709   RBC 4.37 06/20/2023 1709   HGB 13.7 06/20/2023 1709   HGB 14.5 03/08/2018 1612   HCT 39.9 06/20/2023 1709   HCT 43.0 03/08/2018 1612   PLT 237 06/20/2023 1709   PLT 287 03/08/2018 1612   MCV 91.3 06/20/2023 1709   MCV 91 03/08/2018 1612   MCH 31.4 06/20/2023 1709   MCHC 34.3 06/20/2023 1709   RDW 12.7 06/20/2023 1709   RDW 12.3 03/08/2018 1612   LYMPHSABS 1.6 06/20/2023 1709   LYMPHSABS 3.6 (H) 03/08/2018 1612   MONOABS 0.7 06/20/2023 1709   EOSABS 0.0 06/20/2023 1709   EOSABS 0.1 03/08/2018 1612   BASOSABS 0.0 06/20/2023 1709   BASOSABS 0.1 03/08/2018 1612    BMET    Component Value Date/Time   NA 141 06/20/2023 1709   NA 142 11/03/2018 0826   K 2.9 (L) 06/20/2023 1709   CL 102 06/20/2023 1709   CO2 29 06/20/2023 1709   GLUCOSE 135 (H) 06/20/2023 1709   BUN 10 06/20/2023 1709   BUN  13 11/03/2018 0826   CREATININE 0.78 06/20/2023 1709   CREATININE 0.99 (H) 01/12/2020 1131   CALCIUM 8.8 (L) 06/20/2023 1709   GFRNONAA >60 06/20/2023 1709   GFRNONAA 57 (L) 01/12/2020 1131   GFRAA 66 01/12/2020 1131    BNP     Component Value Date/Time   BNP 124.7 (H) 04/01/2023 2243    ProBNP    Component Value Date/Time   PROBNP 69.0 10/03/2020 0943    Imaging: MM 3D SCREENING MAMMOGRAM BILATERAL BREAST Result Date: 07/15/2023 CLINICAL DATA:  Screening. EXAM: DIGITAL SCREENING BILATERAL MAMMOGRAM WITH TOMOSYNTHESIS AND CAD TECHNIQUE: Bilateral screening digital craniocaudal and mediolateral oblique mammograms were obtained. Bilateral screening digital breast tomosynthesis was performed. The images were evaluated with computer-aided detection. COMPARISON:  Previous exam(s). ACR Breast Density Category b: There are scattered areas of fibroglandular density. FINDINGS: There are no findings suspicious for malignancy. IMPRESSION: No mammographic evidence of malignancy. A result letter of this screening mammogram will be mailed directly to the patient. RECOMMENDATION: Screening mammogram in one year. (Code:SM-B-01Y) BI-RADS CATEGORY  1: Negative. Electronically Signed   By: Sherron Ales M.D.   On: 07/15/2023 11:02     Assessment & Plan:   1. Other emphysema (HCC) (Primary)     Emphysema Mild emphysema on CT, likely from past smoking. Normal lung function and spirometry. Occasional dyspnea not affecting quality of life. Advised patient maintain smoking abstinence  - Monitor symptoms, return if dyspnea worsens or limits activities. - Consider Spiriva inhaler if symptoms worsen.  Allergic Rhinitis Sinus and allergy symptoms likely due to high pollen. Improved with allergy medication. Flonase discussed for additional relief. - Continue allergy medication as prescribed. - Consider Flonase for additional relief.  Gastroesophageal Reflux Disease (GERD) GERD with occasional cough and wheezing possibly related to reflux. Advised continuation of Protonix and lifestyle changes. - Continue Protonix as prescribed. - Avoid fatty foods, alcohol, chocolate, caffeine, and eating three hours before bedtime.          Glenford Bayley, NP 08/02/2023

## 2023-08-02 NOTE — Progress Notes (Unsigned)
 Cardiology Office Note:    Date:  08/03/2023   ID:  Angela Pugh, DOB 08-Oct-1945, MRN [id number]  PCP:  [name], MD   [facility] Providers Cardiologist:  Nanetta Batty, MD Cardiology APP:  Marcelino Duster, PA  Advanced Heart Failure:  Arvilla Meres, MD     Referring MD: [name], MD   Chief Complaint  Patient presents with   Follow-up    HTN, SOB    History of Present Illness:    Angela Pugh is a 78 y.o. female with a hx of  HTN, DM, carotid artery stenosis, LBBB, NICM with normal coronaries by heart catheterization.   Coronary CTA 02/2018 showed FFR 0.78 in the proximal LAD prompting LHC. LHC showed normal coronary arteries and moderate LV dysfunction with LVEF 40%. She was referred to AHF clinic and cardiac MRI showed no evidence of myocarditis. Heart monitor was NSR. GDMT titrated for NICM with baseline LVEF 35-40%. Most recent echo 12/2021 with normalized LVEF 50-55%, grade 1 DD, mild MR.   Of note, she is on 81 mg ASA and also taking Goody powders.  She went to the ER 04/01/23 with "feeling  unwell." CE negative x 2 and BNP minimally elevated.   She called our office stating she had electrical sensations on the left side of her chest x 2-3 months and placed on my schedule - I saw her 05/20/23. She was able to climb 2 flights of stairs with SOB in the setting of ongoing smoking. I repeated an echocardiogram that showed low normal LVEF 50-55%, grade 2 DD, severely dilated left atrium, ascending aorta measuring 40 mm.    I suspected her dyspnea on exertion was complicated by likely COPD and ongoing URI at that visit.  Bilateral arm pain sounded related to nerve issues that started last summer. No CAD on heart cath 2019.  She was seen in the ER 06/20/23 with neck pain.  I referred her to pulmonology, seen 08/02/23 felt to have mild emphysema and GERD.   She presents today for follow up. She is not currently having issues with SOB. No  cardiac complaints. She didn't take BP meds last night and is hypertensive today.    Past Medical History:  Diagnosis Date   Arthritis    back, knees, shoulder, hands , injections in pelvis, for bone spurs (05/04/2016)   Chronic back pain    Diverticulosis    Dysrhythmia    LBBB   GERD (gastroesophageal reflux disease)    History of kidney stones    HLD (hyperlipidemia)    Hypertension    Migraine    hx (05/04/2016)   Peripheral neuropathy    Radiculopathy    Sinus complaint    Sinus headache    occasional (05/04/2016)   Type II diabetes mellitus (HCC)    diet controlled, no meds now, was on insulin & po treatment at one time     Vertigo     Past Surgical History:  Procedure Laterality Date   ANTERIOR CERVICAL DECOMP/DISCECTOMY FUSION N/A 04/17/2016   Procedure: ANTERIOR CERVICAL DECOMPRESSION/DISCECTOMY FUSION CERVICAL THREE - CERVICAL FOUR, POSTERIOR CERVICAL DISCECTOMY CERVICAL SEVEN -THORASIC ONE LEFT;  Surgeon: Coletta Memos, MD;  Location: [facility] OR;  Service: Neurosurgery;  Laterality: N/A;  Anterior/Posterior   ANTERIOR FUSION CERVICAL SPINE  2000   Dr. Channing Mutters; "for bone spurs; put hardware in too"   BACK SURGERY N/A 12/08/2022   COLONOSCOPY  06/10/2016   [facility]   COLONOSCOPY  2023   LASIK Bilateral    LEFT HEART CATH AND CORONARY ANGIOGRAPHY N/A 03/17/2018   Procedure: LEFT HEART CATH AND CORONARY ANGIOGRAPHY;  Surgeon: Runell Gess, MD;  Location: [facility] INVASIVE CV LAB;  Service: Cardiovascular;  Laterality: N/A;   LUMBAR LAMINECTOMY Left 06/30/2013   Procedure: Left L3-4 Extraforaminal approach to excise far lateral herniated nucleus pulposus;  Surgeon: Kerrin Champagne, MD;  Location: [facility] OR;  Service: Orthopedics;  Laterality: Left;   TUBAL LIGATION     VAGINAL HYSTERECTOMY      Current Medications: Current Meds  Medication Sig   ABRYSVO 120 MCG/0.5ML injection    amLODipine (NORVASC) 5 MG tablet TAKE 1 TABLET(5 MG) BY MOUTH DAILY   aspirin 81 MG EC tablet Take  81 mg by mouth daily. Swallow whole.   Aspirin-Acetaminophen-Caffeine (GOODY HEADACHE PO) Take 1 packet by mouth daily as needed (headaches).   Blood Glucose Monitoring Suppl DEVI 1 each by Does not apply route in the morning, at noon, and at bedtime. May substitute to any manufacturer covered by patient's insurance.   Calcium Carb-Cholecalciferol (CALCIUM 500 + D PO) Take 1 tablet by mouth daily.   clobetasol ointment (TEMOVATE) 0.05 % Apply 1 Application topically 2 (two) times daily. (Patient taking differently: Apply 1 Application topically 2 (two) times daily as needed (rash).)   cyclobenzaprine (FLEXERIL) 10 MG tablet Take 1 tablet (10 mg total) by mouth 2 (two) times daily as needed for muscle spasms.   dicyclomine (BENTYL) 10 MG capsule Take 1 capsule (10 mg total) by mouth 4 (four) times daily as needed for spasms (for abdominal pain).   ferrous sulfate 325 (65 FE) MG EC tablet Take 325 mg by mouth daily.   Glucose Blood (BLOOD GLUCOSE TEST STRIPS) STRP 1 each by In Vitro route in the morning, at noon, and at bedtime. May substitute to any manufacturer covered by patient's insurance.   guaiFENesin (MUCINEX) 600 MG 12 hr tablet Take by mouth 2 (two) times daily.   Lancets Misc. MISC 1 each by Does not apply route in the morning, at noon, and at bedtime. May substitute to any manufacturer covered by patient's insurance.   meclizine (ANTIVERT) 25 MG tablet Take 1 tablet (25 mg total) by mouth 3 (three) times daily as needed for dizziness.   mesalamine (LIALDA) 1.2 g EC tablet TAKE 4 TABLETS(4.8 GRAMS) BY MOUTH DAILY WITH BREAKFAST   Multiple Vitamins-Minerals (CENTRUM ADULTS PO) Take 1 tablet by mouth daily.    NON FORMULARY Diltiazem 2%/Lidocaine5% compound Use 3 x rectally daily for 2 months to heal anal fissure   olmesartan (BENICAR) 5 MG tablet TAKE 2 TABLETS BY MOUTH DAILY   Omega-3 Fatty Acids (FISH OIL PO) Take 700 mg by mouth daily.   pantoprazole (PROTONIX) 40 MG tablet TAKE 1  TABLET(40 MG) BY MOUTH TWICE DAILY BEFORE A MEAL   potassium chloride SA (KLOR-CON M) 20 MEQ tablet Take 2 tablets (40 mEq total) by mouth daily.   PREDNISOLON-MOXIFLOX-BROMFENAC OP Place 1 drop into both eyes daily as needed (irritation).   rosuvastatin (CRESTOR) 40 MG tablet Take 1 tablet (40 mg total) by mouth daily.   sucralfate (CARAFATE) 1 g tablet TAKE 1 TABLET(1 GRAM) BY MOUTH FOUR TIMES DAILY- WITH EACH MEAL AND AT BEDTIME   triamcinolone cream (KENALOG) 0.5 % Apply topically 2 (two) times daily.   vitamin B-12 (CYANOCOBALAMIN) 1000 MCG tablet Take 1,000 mcg by mouth daily.   Current Facility-Administered Medications for the 08/03/23 encounter (Office Visit) with  Marcelino Duster, PA  Medication   diclofenac Sodium (VOLTAREN) 1 % topical gel 2 g     Allergies:   Symbicort [budesonide-formoterol fumarate], Acetaminophen, Azithromycin, Lipitor [atorvastatin], Pregabalin, and Zyrtec [cetirizine]   Social History   Socioeconomic History   Marital status: Widowed    Spouse name: Not on file   Number of children: 6   Years of education: Not on file   Highest education level: Not on file  Occupational History   Occupation: retired  Tobacco Use   Smoking status: Some Days    Current packs/day: 0.00    Average packs/day: 0.5 packs/day for 20.0 years (10.0 ttl pk-yrs)    Types: Cigarettes    Start date: 06/30/1962    Last attempt to quit: 06/30/1982    Years since quitting: 41.1   Smokeless tobacco: Never   Tobacco comments:    Patient only smokes in social setting when drinking. Admits former h/o heavy smoking years ago smoked 1.5 packs/day.    12/04/2021 Patient states she has not spoke for about 3 weeks    12/01/2022: pt will have a few drags if she is having a drink  Vaping Use   Vaping status: Never Used  Substance and Sexual Activity   Alcohol use: Yes    Comment: special occasion   Drug use: No   Sexual activity: Not Currently  Other Topics Concern   Not on file   Social History Narrative   Not on file   Social Drivers of Health   Financial Resource Strain: Low Risk  (02/19/2023)   Overall Financial Resource Strain (CARDIA)    Difficulty of Paying Living Expenses: Not hard at all  Food Insecurity: No Food Insecurity (02/19/2023)   Hunger Vital Sign    Worried About Running Out of Food in the Last Year: Never true    Ran Out of Food in the Last Year: Never true  Transportation Needs: No Transportation Needs (02/19/2023)   PRAPARE - Administrator, Civil Service (Medical): No    Lack of Transportation (Non-Medical): No  Physical Activity: Sufficiently Active (02/19/2023)   Exercise Vital Sign    Days of Exercise per Week: 7 days    Minutes of Exercise per Session: 60 min  Stress: No Stress Concern Present (02/19/2023)   [employer] of Occupational Health - Occupational Stress Questionnaire    Feeling of Stress : Not at all  Social Connections: Moderately Integrated (02/19/2023)   Social Connection and Isolation Panel [NHANES]    Frequency of Communication with Friends and Family: More than three times a week    Frequency of Social Gatherings with Friends and Family: More than three times a week    Attends Religious Services: More than 4 times per year    Active Member of [employer] or Organizations: Yes    Attends Banker Meetings: More than 4 times per year    Marital Status: Widowed     Family History: The patient's family history includes Diabetes Mellitus II in her brother; Diabetes type II in her sister; Diverticulitis in her daughter; Heart Problems in her daughter; SIDS in her son; Uterine cancer in her mother. There is no history of Colon cancer, Pancreatic cancer, Esophageal cancer, Colon polyps, Rectal cancer, or Stomach cancer.  ROS:   Please see the history of present illness.     All other systems reviewed and are negative.  EKGs/Labs/Other Studies Reviewed:    The following studies were reviewed  today:  Recent Labs: 02/12/2023: TSH 1.88 04/01/2023: B Natriuretic Peptide 124.7 05/25/2023: ALT 8 06/20/2023: BUN 10; Creatinine, Ser 0.78; Hemoglobin 13.7; Platelets 237; Potassium 2.9; Sodium 141  Recent Lipid Panel    Component Value Date/Time   CHOL 191 02/12/2023 1003   CHOL 205 (H) 06/23/2022 1051   TRIG 136.0 02/12/2023 1003   HDL 66.70 02/12/2023 1003   HDL 56 06/23/2022 1051   CHOLHDL 3 02/12/2023 1003   VLDL 27.2 02/12/2023 1003   LDLCALC 97 02/12/2023 1003   LDLCALC 119 (H) 06/23/2022 1051     Risk Assessment/Calculations:      HYPERTENSION CONTROL Vitals:   08/03/23 1543  BP: (!) 154/77    The patient's blood pressure is elevated above target today.  In order to address the patient's elevated BP: Blood pressure will be monitored at home to determine if medication changes need to be made.            Physical Exam:    VS:  BP (!) 154/77   Pulse 63   Ht 5\' 8"  (1.727 m)   Wt 160 lb (72.6 kg)   SpO2 94%   BMI 24.33 kg/m     Wt Readings from Last 3 Encounters:  08/03/23 160 lb (72.6 kg)  08/02/23 158 lb 12.8 oz (72 kg)  07/30/23 159 lb 9.6 oz (72.4 kg)     GEN:  Well nourished, well developed in no acute distress HEENT: Normal NECK: No JVD; No carotid bruits LYMPHATICS: No lymphadenopathy CARDIAC: RRR, no murmurs, rubs, gallops RESPIRATORY:  Clear to auscultation without rales, wheezing or rhonchi  ABDOMEN: Soft, non-tender, non-distended MUSCULOSKELETAL:  No edema; No deformity  SKIN: Warm and dry NEUROLOGIC:  Alert and oriented x 3 PSYCHIATRIC:  Normal affect   ASSESSMENT:    No diagnosis found. PLAN:    In order of problems listed above:   DOE Chronic diastolic heart failure - LVEF 16-10% and grade 2 DD - continue 10 mg olmesartan and 5 mg amlodipine   Chronic systolic and diastolic heart failure secondary to NICM - LVEF 35-40%, now preserved with grade 2 DD - GDMT: 5 mg olmesartan - not volume up on  exam   Hypertension - olmesartan and amlodipine - continue - HTN today, but didn't take her BP medication last night - she will monitor at home   Hyperlipidemia with LDL goal < 70 02/12/2023: Cholesterol 191; HDL 66.70; LDL Cholesterol 97; Triglycerides 136.0; VLDL 27.2 - omega 3 fatty acids, 20 mg crestor - I opted to increase crestor to 40 mg in Jan 2025   Tobacco abuse Wheezing on exam - I referred her to pulmonology at last visit, she saw them   Follow up in 6 months.    Medication Adjustments/Labs and Tests Ordered: Current medicines are reviewed at length with the patient today.  Concerns regarding medicines are outlined above.  No orders of the defined types were placed in this encounter.  No orders of the defined types were placed in this encounter.   Patient Instructions  Medication Instructions:  Your physician recommends that you continue on your current medications as directed. Please refer to the Current Medication list given to you today.  *If you need a refill on your cardiac medications before your next appointment, please call your pharmacy*   Lab Work: NONE ordered at this time of appointment   Testing/Procedures: NONE ordered at this time of appointment   Follow-Up: At [facility], you and your health needs are our priority.  As part of our continuing mission to provide you with exceptional heart care, we have created designated Provider Care Teams.  These Care Teams include your primary Cardiologist (physician) and Advanced Practice Providers (APPs -  Physician Assistants and Nurse Practitioners) who all work together to provide you with the care you need, when you need it.  We recommend signing up for the patient portal called "MyChart".  Sign up information is provided on this After Visit Summary.  MyChart is used to connect with patients for Virtual Visits (Telemedicine).  Patients are able to view lab/test results, encounter notes,  upcoming appointments, etc.  Non-urgent messages can be sent to your provider as well.   To learn more about what you can do with MyChart, go to [email or web address].    Your next appointment:   6 month(s)  Provider:   Nanetta Batty, MD  or Micah Flesher, PA-C              Signed, Roe Rutherford [place], [place]  08/03/2023 4:37 PM    [facility]

## 2023-08-03 ENCOUNTER — Encounter: Payer: Self-pay | Admitting: Physician Assistant

## 2023-08-03 ENCOUNTER — Ambulatory Visit: Payer: Medicare Other | Attending: Physician Assistant | Admitting: Physician Assistant

## 2023-08-03 VITALS — BP 154/77 | HR 63 | Ht 68.0 in | Wt 160.0 lb

## 2023-08-03 DIAGNOSIS — I10 Essential (primary) hypertension: Secondary | ICD-10-CM

## 2023-08-03 DIAGNOSIS — J439 Emphysema, unspecified: Secondary | ICD-10-CM

## 2023-08-03 DIAGNOSIS — Z720 Tobacco use: Secondary | ICD-10-CM

## 2023-08-03 DIAGNOSIS — R0609 Other forms of dyspnea: Secondary | ICD-10-CM

## 2023-08-03 DIAGNOSIS — I428 Other cardiomyopathies: Secondary | ICD-10-CM

## 2023-08-03 DIAGNOSIS — E785 Hyperlipidemia, unspecified: Secondary | ICD-10-CM

## 2023-08-03 NOTE — Patient Instructions (Signed)
 Medication Instructions:  Your physician recommends that you continue on your current medications as directed. Please refer to the Current Medication list given to you today.  *If you need a refill on your cardiac medications before your next appointment, please call your pharmacy*   Lab Work: NONE ordered at this time of appointment   Testing/Procedures: NONE ordered at this time of appointment   Follow-Up: At [facility], you and your health needs are our priority.  As part of our continuing mission to provide you with exceptional heart care, we have created designated Provider Care Teams.  These Care Teams include your primary Cardiologist (physician) and Advanced Practice Providers (APPs -  Physician Assistants and Nurse Practitioners) who all work together to provide you with the care you need, when you need it.  We recommend signing up for the patient portal called "MyChart".  Sign up information is provided on this After Visit Summary.  MyChart is used to connect with patients for Virtual Visits (Telemedicine).  Patients are able to view lab/test results, encounter notes, upcoming appointments, etc.  Non-urgent messages can be sent to your provider as well.   To learn more about what you can do with MyChart, go to [email or web address].    Your next appointment:   6 month(s)  Provider:   Nanetta Batty, MD  or Micah Flesher, PA-C

## 2023-08-03 NOTE — Progress Notes (Signed)
 She didn't take BP medication last night

## 2023-08-11 ENCOUNTER — Encounter: Payer: Self-pay | Admitting: Family Medicine

## 2023-08-11 ENCOUNTER — Encounter: Payer: Self-pay | Admitting: Podiatry

## 2023-08-11 ENCOUNTER — Ambulatory Visit (INDEPENDENT_AMBULATORY_CARE_PROVIDER_SITE_OTHER): Admitting: Family Medicine

## 2023-08-11 ENCOUNTER — Ambulatory Visit: Payer: Medicare Other | Admitting: Podiatry

## 2023-08-11 VITALS — Ht 68.0 in | Wt 160.0 lb

## 2023-08-11 VITALS — BP 160/84 | HR 77 | Temp 99.0°F | Wt 157.2 lb

## 2023-08-11 DIAGNOSIS — M79675 Pain in left toe(s): Secondary | ICD-10-CM | POA: Diagnosis not present

## 2023-08-11 DIAGNOSIS — M2041 Other hammer toe(s) (acquired), right foot: Secondary | ICD-10-CM | POA: Diagnosis not present

## 2023-08-11 DIAGNOSIS — M2042 Other hammer toe(s) (acquired), left foot: Secondary | ICD-10-CM

## 2023-08-11 DIAGNOSIS — B351 Tinea unguium: Secondary | ICD-10-CM | POA: Diagnosis not present

## 2023-08-11 DIAGNOSIS — M79674 Pain in right toe(s): Secondary | ICD-10-CM | POA: Diagnosis not present

## 2023-08-11 DIAGNOSIS — M542 Cervicalgia: Secondary | ICD-10-CM

## 2023-08-11 DIAGNOSIS — I10 Essential (primary) hypertension: Secondary | ICD-10-CM

## 2023-08-11 DIAGNOSIS — Q828 Other specified congenital malformations of skin: Secondary | ICD-10-CM

## 2023-08-11 DIAGNOSIS — E1151 Type 2 diabetes mellitus with diabetic peripheral angiopathy without gangrene: Secondary | ICD-10-CM | POA: Diagnosis not present

## 2023-08-11 DIAGNOSIS — E119 Type 2 diabetes mellitus without complications: Secondary | ICD-10-CM | POA: Diagnosis not present

## 2023-08-11 MED ORDER — OLMESARTAN MEDOXOMIL 5 MG PO TABS: 10.0000 mg | ORAL_TABLET | Freq: Every day | ORAL | 3 refills | Status: AC

## 2023-08-11 MED ORDER — MORPHINE SULFATE 15 MG PO TABS: 7.5000 mg | ORAL_TABLET | Freq: Four times a day (QID) | ORAL | 0 refills | Status: AC | PRN

## 2023-08-11 NOTE — Progress Notes (Signed)
 ANNUAL DIABETIC FOOT EXAM  Subjective: Angela Pugh presents today for annual diabetic foot exam. Chief Complaint  Patient presents with   Nail Problem    Pt is here for [facility] last A1C was below 7 PCP is Dr Salomon Fick and LOV was in March.   Patient confirms h/o diabetes.  Patient denies any h/o foot wounds.  Angela Saint, MD is patient's PCP.  Past Medical History:  Diagnosis Date   Arthritis    back, knees, shoulder, hands , injections in pelvis, for bone spurs (05/04/2016)   Chronic back pain    Diverticulosis    Dysrhythmia    LBBB   GERD (gastroesophageal reflux disease)    History of kidney stones    HLD (hyperlipidemia)    Hypertension    Migraine    hx (05/04/2016)   Peripheral neuropathy    Radiculopathy    Sinus complaint    Sinus headache    occasional (05/04/2016)   Type II diabetes mellitus (HCC)    diet controlled, no meds now, was on insulin & po treatment at one time     Vertigo    Patient Active Problem List   Diagnosis Date Noted   Diverticulosis 04/01/2023   Constipation 02/18/2023   Radiculopathy, lumbar region 12/08/2022   Spondylolisthesis, lumbar region 12/08/2022   Headache above the eye region 02/19/2021   Nasal congestion 02/19/2021   Emphysema lung (HCC) 10/04/2020   Abdominal pain, epigastric 03/18/2020   Body mass index (BMI) 26.0-26.9, adult 10/23/2019   Nonischemic cardiomyopathy (HCC) 04/12/2018   Chest pain 01/25/2018   Left bundle branch block 01/25/2018   Carotid artery disease (HCC) 01/25/2018   Seasonal allergies 10/18/2017   Arthritis 10/18/2017   Pre-diabetes 10/18/2017   Protein-calorie malnutrition, severe 05/05/2016   Dysphagia 05/04/2016   Displacement of cervical intervertebral disc without myelopathy 03/18/2016   Carpal tunnel syndrome 02/10/2016   Cervical spondylosis with radiculopathy 02/10/2016   Abdominal pain 02/01/2016   Acute diverticulitis 02/01/2016   HLD (hyperlipidemia) 02/01/2016    Diabetes mellitus without complication (HCC) 02/01/2016   Hypokalemia 02/01/2016   Hypertension    Cervical spondylosis without myelopathy 01/13/2016   Bilateral carpal tunnel syndrome 01/13/2016   Herniated lumbar intervertebral disc 06/30/2013    Class: Acute   Low back pain 02/06/2013   Past Surgical History:  Procedure Laterality Date   ANTERIOR CERVICAL DECOMP/DISCECTOMY FUSION N/A 04/17/2016   Procedure: ANTERIOR CERVICAL DECOMPRESSION/DISCECTOMY FUSION CERVICAL THREE - CERVICAL FOUR, POSTERIOR CERVICAL DISCECTOMY CERVICAL SEVEN -THORASIC ONE LEFT;  Surgeon: Coletta Memos, MD;  Location: [facility] OR;  Service: Neurosurgery;  Laterality: N/A;  Anterior/Posterior   ANTERIOR FUSION CERVICAL SPINE  2000   Dr. Channing Mutters; "for bone spurs; put hardware in too"   BACK SURGERY N/A 12/08/2022   COLONOSCOPY  06/10/2016   Gabem   COLONOSCOPY  2023   LASIK Bilateral    LEFT HEART CATH AND CORONARY ANGIOGRAPHY N/A 03/17/2018   Procedure: LEFT HEART CATH AND CORONARY ANGIOGRAPHY;  Surgeon: Runell Gess, MD;  Location: [facility] INVASIVE [facility];  Service: Cardiovascular;  Laterality: N/A;   LUMBAR LAMINECTOMY Left 06/30/2013   Procedure: Left L3-4 Extraforaminal approach to excise far lateral herniated nucleus pulposus;  Surgeon: Kerrin Champagne, MD;  Location: [facility] OR;  Service: Orthopedics;  Laterality: Left;   TUBAL LIGATION     VAGINAL HYSTERECTOMY     Current Outpatient Medications on File Prior to Visit  Medication Sig Dispense Refill   ABRYSVO 120 MCG/0.5ML injection  amLODipine (NORVASC) 5 MG tablet TAKE 1 TABLET(5 MG) BY MOUTH DAILY 90 tablet 0   aspirin 81 MG EC tablet Take 81 mg by mouth daily. Swallow whole.     Aspirin-Acetaminophen-Caffeine (GOODY HEADACHE PO) Take 1 packet by mouth daily as needed (headaches).     Blood Glucose Monitoring Suppl DEVI 1 each by Does not apply route in the morning, at noon, and at bedtime. May substitute to any manufacturer covered by patient's insurance. 1 each  0   Calcium Carb-Cholecalciferol (CALCIUM 500 + D PO) Take 1 tablet by mouth daily.     clobetasol ointment (TEMOVATE) 0.05 % Apply 1 Application topically 2 (two) times daily. (Patient taking differently: Apply 1 Application topically 2 (two) times daily as needed (rash).) 30 g 0   cyclobenzaprine (FLEXERIL) 10 MG tablet Take 1 tablet (10 mg total) by mouth 2 (two) times daily as needed for muscle spasms. 20 tablet 0   dicyclomine (BENTYL) 10 MG capsule Take 1 capsule (10 mg total) by mouth 4 (four) times daily as needed for spasms (for abdominal pain). 90 capsule 3   ferrous sulfate 325 (65 FE) MG EC tablet Take 325 mg by mouth daily.     Glucose Blood (BLOOD GLUCOSE TEST STRIPS) STRP 1 each by In Vitro route in the morning, at noon, and at bedtime. May substitute to any manufacturer covered by patient's insurance. 100 strip 11   guaiFENesin (MUCINEX) 600 MG 12 hr tablet Take by mouth 2 (two) times daily.     Lancets Misc. MISC 1 each by Does not apply route in the morning, at noon, and at bedtime. May substitute to any manufacturer covered by patient's insurance. 100 each 11   meclizine (ANTIVERT) 25 MG tablet Take 1 tablet (25 mg total) by mouth 3 (three) times daily as needed for dizziness. 20 tablet 1   mesalamine (LIALDA) 1.2 g EC tablet TAKE 4 TABLETS(4.8 GRAMS) BY MOUTH DAILY WITH BREAKFAST 360 tablet 0   Multiple Vitamins-Minerals (CENTRUM ADULTS PO) Take 1 tablet by mouth daily.      NON FORMULARY Diltiazem 2%/Lidocaine5% compound Use 3 x rectally daily for 2 months to heal anal fissure 30 g 1   Omega-3 Fatty Acids (FISH OIL PO) Take 700 mg by mouth daily.     pantoprazole (PROTONIX) 40 MG tablet TAKE 1 TABLET(40 MG) BY MOUTH TWICE DAILY BEFORE A MEAL 180 tablet 3   potassium chloride SA (KLOR-CON M) 20 MEQ tablet Take 2 tablets (40 mEq total) by mouth daily. 180 tablet 1   PREDNISOLON-MOXIFLOX-BROMFENAC OP Place 1 drop into both eyes daily as needed (irritation).     rosuvastatin  (CRESTOR) 40 MG tablet Take 1 tablet (40 mg total) by mouth daily. 90 tablet 0   sucralfate (CARAFATE) 1 g tablet TAKE 1 TABLET(1 GRAM) BY MOUTH FOUR TIMES DAILY- WITH EACH MEAL AND AT BEDTIME 360 tablet 0   triamcinolone cream (KENALOG) 0.5 % Apply topically 2 (two) times daily. 30 g 3   vitamin B-12 (CYANOCOBALAMIN) 1000 MCG tablet Take 1,000 mcg by mouth daily.     Current Facility-Administered Medications on File Prior to Visit  Medication Dose Route Frequency Provider Last Rate Last Admin   diclofenac Sodium (VOLTAREN) 1 % topical gel 2 g  2 g Topical PRN         Allergies  Allergen Reactions   Symbicort [Budesonide-Formoterol Fumarate] Shortness Of Breath, Swelling and Other (See Comments)    Pharyngeal edema, hoarseness, tightness in chest, lips became  dark.   Acetaminophen Hives and Nausea Only   Azithromycin Hives and Nausea And Vomiting   Lipitor [Atorvastatin] Nausea Only    weakness   Pregabalin Nausea And Vomiting    dizziness   Zyrtec [Cetirizine] Hives   Social History   Occupational History   Occupation: retired  Tobacco Use   Smoking status: Some Days    Current packs/day: 0.00    Average packs/day: 0.5 packs/day for 20.0 years (10.0 ttl pk-yrs)    Types: Cigarettes    Start date: 06/30/1962    Last attempt to quit: 06/30/1982    Years since quitting: 41.1   Smokeless tobacco: Never   Tobacco comments:    Patient only smokes in social setting when drinking. Admits former h/o heavy smoking years ago smoked 1.5 packs/day.    12/04/2021 Patient states she has not spoke for about 3 weeks    12/01/2022: pt will have a few drags if she is having a drink  Vaping Use   Vaping status: Never Used  Substance and Sexual Activity   Alcohol use: Yes    Comment: special occasion   Drug use: No   Sexual activity: Not Currently   Family History  Problem Relation Age of Onset   Uterine cancer Mother    Diabetes type II Sister    Diabetes Mellitus II Brother     Diverticulitis Daughter    Heart Problems Daughter    SIDS Son    Colon cancer Neg Hx    Pancreatic cancer Neg Hx    Esophageal cancer Neg Hx    Colon polyps Neg Hx    Rectal cancer Neg Hx    Stomach cancer Neg Hx    Immunization History  Administered Date(s) Administered   Fluad Quad(high Dose 65+) 02/10/2022   Fluad Trivalent(High Dose 65+) 02/12/2023   PFIZER(Purple Top)SARS-COV-2 Vaccination 06/24/2019, 07/15/2019, 02/27/2020   PNEUMOCOCCAL CONJUGATE-20 09/10/2022   Rsv, Bivalent, Protein Subunit Rsvpref,pf Verdis Frederickson) 09/10/2022   Tdap 09/10/2022   Zoster Recombinant(Shingrix) 09/10/2022     Review of Systems: Negative except as noted in the HPI.   Objective: There were no vitals filed for this visit.  Antara Brecheisen is a pleasant 78 y.o. female in NAD. AAO X 3.  Diabetic foot exam was performed with the following findings:   Vascular Examination: CFT <3 seconds b/l. DP pulses faintly palpable right foot; nonpalpable left foot. PT pulses faintly palpable b/l. Skin temperature gradient warm to warm b/l. No pain with calf compression. No ischemia or gangrene. No cyanosis or clubbing noted b/l. Pedal hair absent.   Neurological Examination: Sensation grossly intact b/l with 10 gram monofilament. Vibratory sensation intact b/l. Pt has subjective symptoms of neuropathy.  Dermatological Examination: Pedal skin warm and supple b/l.   No open wounds. No interdigital macerations.  Toenails 1-5 b/l thick, discolored, elongated with subungual debris and pain on dorsal palpation.    Porokeratotic lesion(s) plantar heel pad of right foot, posterolateral aspect of right heel, submet head 2 left foot, and submet head 2 right foot. No erythema, no edema, no drainage, no fluctuance.  Musculoskeletal Examination: Muscle strength 5/5 to all lower extremity muscle groups bilaterally. Hammertoe(s) submet head 2 left foot and submet head 2 right foot.  Radiographs: None  Last A1c:   @AMBQMDIABETESHGBA1C @     Lab Results  Component Value Date   HGBA1C 6.4 (H) 12/01/2022   ADA Risk Categorization: High Risk  Patient has one or more of the following: Loss of protective  sensation Absent pedal pulses Severe Foot deformity History of foot ulcer  Assessment: 1. Pain due to onychomycosis of toenails of both feet   2. Porokeratosis   3. Acquired hammertoes of both feet   4. Type II diabetes mellitus with peripheral circulatory disorder (HCC)   5. Encounter for diabetic foot exam (HCC)     Plan: Diabetic foot examination performed today.  All patient's and/or POA's questions/concerns addressed on today's visit. Toenails 1-5 debrided in length and girth without incident. Porokeratotic lesion(s)  x2 right heel, submet head 2 left foot, and submet head 2 right foot pared with sharp debridement without incident. Salinocaine Ointment applied. She may wash this off in 6 hours. No further treatment required. Continue daily foot inspections and monitor blood glucose per PCP/Endocrinologist's recommendations. Continue soft, supportive shoe gear daily. Report any pedal injuries to medical professional. Call office if there are any questions/concerns. -Patient/POA to call should there be question/concern in the interim. Return in about 3 months (around 11/11/2023).  Freddie Breech, DPM      [place] LOCATION: [address], [place] [address]                   Office [phone]   [facility] LOCATION: [address] [place], [place] [address] Office [phone]

## 2023-08-11 NOTE — Progress Notes (Signed)
 Established Patient Office Visit   Subjective  Patient ID: Angela Pugh, female    DOB: Jan 13, 1946  Age: 78 y.o. MRN: [id number]  Chief Complaint  Patient presents with   Neck Pain    Patient complains of right sided neck pain, x2 months, Denies any known injury     Patient is a 78 year old female seen for follow-up on ongoing right-sided neck pain.  Patient endorses worsened right sided neck pain with radiation from right occipital area into right shoulder.  Patient notes mild weakness in RUE.  Dropped an item at home recently.  In the past patient seen by neurosurgery Dr. Franky Macho, Dr. Christell Constant, and another provider.  States given morphine sulfate IR for severe pain but would cut tabs in pieces.  Does not have any more of the medication.  Had anterior cervical decomp/discectomy fusion c 3-4, posterior cervical discectomy C7-T1 in 2017.    Patient Active Problem List   Diagnosis Date Noted   Diverticulosis 04/01/2023   Constipation 02/18/2023   Radiculopathy, lumbar region 12/08/2022   Spondylolisthesis, lumbar region 12/08/2022   Headache above the eye region 02/19/2021   Nasal congestion 02/19/2021   Emphysema lung (HCC) 10/04/2020   Abdominal pain, epigastric 03/18/2020   Body mass index (BMI) 26.0-26.9, adult 10/23/2019   Nonischemic cardiomyopathy (HCC) 04/12/2018   Chest pain 01/25/2018   Left bundle branch block 01/25/2018   Carotid artery disease (HCC) 01/25/2018   Seasonal allergies 10/18/2017   Arthritis 10/18/2017   Pre-diabetes 10/18/2017   Protein-calorie malnutrition, severe 05/05/2016   Dysphagia 05/04/2016   Displacement of cervical intervertebral disc without myelopathy 03/18/2016   Carpal tunnel syndrome 02/10/2016   Cervical spondylosis with radiculopathy 02/10/2016   Abdominal pain 02/01/2016   Acute diverticulitis 02/01/2016   HLD (hyperlipidemia) 02/01/2016   Diabetes mellitus without complication (HCC) 02/01/2016   Hypokalemia 02/01/2016    Hypertension    Cervical spondylosis without myelopathy 01/13/2016   Bilateral carpal tunnel syndrome 01/13/2016   Herniated lumbar intervertebral disc 06/30/2013    Class: Acute   Low back pain 02/06/2013   Past Medical History:  Diagnosis Date   Arthritis    back, knees, shoulder, hands , injections in pelvis, for bone spurs (05/04/2016)   Chronic back pain    Diverticulosis    Dysrhythmia    LBBB   GERD (gastroesophageal reflux disease)    History of kidney stones    HLD (hyperlipidemia)    Hypertension    Migraine    hx (05/04/2016)   Peripheral neuropathy    Radiculopathy    Sinus complaint    Sinus headache    occasional (05/04/2016)   Type II diabetes mellitus (HCC)    diet controlled, no meds now, was on insulin & po treatment at one time     Vertigo    Past Surgical History:  Procedure Laterality Date   ANTERIOR CERVICAL DECOMP/DISCECTOMY FUSION N/A 04/17/2016   Procedure: ANTERIOR CERVICAL DECOMPRESSION/DISCECTOMY FUSION CERVICAL THREE - CERVICAL FOUR, POSTERIOR CERVICAL DISCECTOMY CERVICAL SEVEN -THORASIC ONE LEFT;  Surgeon: Coletta Memos, MD;  Location: [facility] OR;  Service: Neurosurgery;  Laterality: N/A;  Anterior/Posterior   ANTERIOR FUSION CERVICAL SPINE  2000   Dr. Channing Mutters; "for bone spurs; put hardware in too"   BACK SURGERY N/A 12/08/2022   COLONOSCOPY  06/10/2016   Gabem   COLONOSCOPY  2023   LASIK Bilateral    LEFT HEART CATH AND CORONARY ANGIOGRAPHY N/A 03/17/2018   Procedure: LEFT HEART CATH AND CORONARY ANGIOGRAPHY;  Surgeon:  Runell Gess, MD;  Location: [facility] INVAS[facility] CV LAB;  Service: Cardiovascular;  Laterality: N/A;   LUMBAR LAMINECTOMY Left 06/30/2013   Procedure: Left L3-4 Extraforaminal approach to excise far lateral herniated nucleus pulposus;  Surgeon: Kerrin Champagne, MD;  Location: [facility] OR;  Service: Orthopedics;  Laterality: Left;   TUBAL LIGATION     VAGINAL HYSTERECTOMY     Social History   Tobacco Use   Smoking status: Some Days     Current packs/day: 0.00    Average packs/day: 0.5 packs/day for 20.0 years (10.0 ttl pk-yrs)    Types: Cigarettes    Start date: 06/30/1962    Last attempt to quit: 06/30/1982    Years since quitting: 41.1   Smokeless tobacco: Never   Tobacco comments:    Patient only smokes in social setting when drinking. Admits former h/o heavy smoking years ago smoked 1.5 packs/day.    12/04/2021 Patient states she has not spoke for about 3 weeks    12/01/2022: pt will have a few drags if she is having a drink  Vaping Use   Vaping status: Never Used  Substance Use Topics   Alcohol use: Yes    Comment: special occasion   Drug use: No   Family History  Problem Relation Age of Onset   Uterine cancer Mother    Diabetes type II Sister    Diabetes Mellitus II Brother    Diverticulitis Daughter    Heart Problems Daughter    SIDS Son    Colon cancer Neg Hx    Pancreatic cancer Neg Hx    Esophageal cancer Neg Hx    Colon polyps Neg Hx    Rectal cancer Neg Hx    Stomach cancer Neg Hx    Allergies  Allergen Reactions   Symbicort [Budesonide-Formoterol Fumarate] Shortness Of Breath, Swelling and Other (See Comments)    Pharyngeal edema, hoarseness, tightness in chest, lips became dark.   Acetaminophen Hives and Nausea Only   Azithromycin Hives and Nausea And Vomiting   Lipitor [Atorvastatin] Nausea Only    weakness   Pregabalin Nausea And Vomiting    dizziness   Zyrtec [Cetirizine] Hives      ROS Negative unless stated above    Objective:     BP (!) 160/84 (BP Location: Left Arm, Patient Position: Sitting, Cuff Size: Normal)   Pulse 77   Temp 99 F (37.2 C) (Oral)   Wt 157 lb 3.2 oz (71.3 kg)   SpO2 96%   BMI 23.90 kg/m  BP Readings from Last 3 Encounters:  08/11/23 (!) 160/84  08/03/23 (!) 154/77  08/02/23 123/77   Wt Readings from Last 3 Encounters:  08/11/23 157 lb 3.2 oz (71.3 kg)  08/11/23 160 lb (72.6 kg)  08/03/23 160 lb (72.6 kg)    Physical Exam Constitutional:       General: She is not in acute distress.    Appearance: Normal appearance.  HENT:     Head: Normocephalic and atraumatic.     Nose: Nose normal.     Mouth/Throat:     Mouth: Mucous membranes are moist.  Cardiovascular:     Rate and Rhythm: Normal rate and regular rhythm.     Heart sounds: Normal heart sounds. No murmur heard.    No gallop.  Pulmonary:     Effort: Pulmonary effort is normal. No respiratory distress.     Breath sounds: Normal breath sounds. No wheezing, rhonchi or rales.  Musculoskeletal:  Right shoulder: Tenderness present.     Left shoulder: Normal.     Right upper arm: Tenderness present.     Left upper arm: Normal.     Cervical back: Bony tenderness present.     Thoracic back: Bony tenderness present.     Comments: Patient with decreased active and passive ROM of RUE, 85 degrees with pain.  TTP of right cervical portion of trapezius and occipital area for lateral trapezius at shoulder and scapula.  TTP of right bicep and long head of biceps brachii.  Skin:    General: Skin is warm and dry.  Neurological:     Mental Status: She is alert and oriented to person, place, and time.    No results found for any visits on 08/11/23.    Assessment & Plan:  Cervicalgia -     MR CERVICAL SPINE WO CONTRAST; Future -     Ambulatory referral to Neurosurgery -     Morphine Sulfate; Take 0.5 tablets (7.5 mg total) by mouth every 6 (six) hours as needed for up to 5 days for severe pain (pain score 7-10).  Dispense: 10 tablet; Refill: 0  Essential hypertension -     Olmesartan Medoxomil; Take 2 tablets (10 mg total) by mouth daily.  Dispense: 180 tablet; Refill: 3  Neck pain -     Morphine Sulfate; Take 0.5 tablets (7.5 mg total) by mouth every 6 (six) hours as needed for up to 5 days for severe pain (pain score 7-10).  Dispense: 10 tablet; Refill: 0  Patient with worsening right sided neck pain with radiation into the shoulder now with weakness in RUE.  Seen by this  provider and in ED numerous times in the last few months for similar symptoms.  CT cervical spine 06/20/2023 with extensive postsurgical changes (moderate to space narrowing at C2-3 and moderate/severe degenerative changes C7-T1) of the cervical spine.  No acute osseous abnormality.  Given patient's worsening symptoms obtain MRI.  A short-term supply of morphine IR provided.  PMDP reviewed.  Last given a few days of medication in January 2025 by Ortho.  Referral to neurosurgery.  Consider PT.  BP uncontrolled likely 2/2 discomfort.  Monitor BP at home.  For continued/worsening symptoms increase Benicar from 10 mg.  Continue Norvasc 5 mg and olmesartan 10 mg daily.  Return if symptoms worsen or fail to improve.   [name], MD

## 2023-08-12 ENCOUNTER — Telehealth: Payer: Self-pay | Admitting: *Deleted

## 2023-08-12 ENCOUNTER — Other Ambulatory Visit: Payer: Self-pay | Admitting: Family Medicine

## 2023-08-12 DIAGNOSIS — F40240 Claustrophobia: Secondary | ICD-10-CM

## 2023-08-12 MED ORDER — LORAZEPAM 0.5 MG PO TABS: 0.5000 mg | ORAL_TABLET | Freq: Once | ORAL | 0 refills | Status: AC

## 2023-08-12 NOTE — Telephone Encounter (Signed)
 Rx sent to pharmacy

## 2023-08-12 NOTE — Telephone Encounter (Signed)
 Patient is aware

## 2023-08-12 NOTE — Telephone Encounter (Signed)
 Copied from CRM [id number]. Topic: Clinical - Medication Question >> Aug 12, 2023  9:20 AM [name] wrote: Reason for CRM: Patient is having an MRI done at 4:50p on tomorrow(08/13/2023) and has asked for the provider to prescribe her a 5MG  Valium tablet because she is claustrophobic; patients preferred pharmacy is  [id number] [place], [place] - [address] AT [facility] OF [facility] [address] & [facility] [address] [place] [address] Phone: [phone]  Fax: [phone]   Please follow up with patient if necessary [phone] (H) [phone] (M)

## 2023-08-13 ENCOUNTER — Ambulatory Visit: Payer: Self-pay | Admitting: Family Medicine

## 2023-08-13 ENCOUNTER — Ambulatory Visit
Admission: RE | Admit: 2023-08-13 | Discharge: 2023-08-13 | Disposition: A | Discharge status: Home / Self Care | Source: Ambulatory Visit | Attending: Family Medicine | Admitting: Family Medicine

## 2023-08-13 ENCOUNTER — Telehealth: Payer: Self-pay | Admitting: Family Medicine

## 2023-08-13 DIAGNOSIS — Z981 Arthrodesis status: Secondary | ICD-10-CM | POA: Diagnosis not present

## 2023-08-13 DIAGNOSIS — M4802 Spinal stenosis, cervical region: Secondary | ICD-10-CM | POA: Diagnosis not present

## 2023-08-13 DIAGNOSIS — M542 Cervicalgia: Secondary | ICD-10-CM

## 2023-08-13 NOTE — Telephone Encounter (Signed)
 Misty Stanley from [facility] called with the following critical result:  "MRI Cervical Spine ordered by Dr. Salomon Fick severe spinal stenosis and cord flattening"  This RN called the on call provider, [name], and received her vm. This RN provided a call back number as well as pt's MRN. Reason for Disposition . Health Information question, no triage required and triager able to answer question  Answer Assessment - Initial Assessment Questions 1. REASON FOR CALL or QUESTION: "What is your reason for calling today?" or "How can I best help you?" or "What question do you have that I can help answer?"     Critical results  Protocols used: Information Only Call - No Triage-A-AH

## 2023-08-13 NOTE — Telephone Encounter (Addendum)
 Called patient to notify of results.  Reports had discussed results with PCP and has appointment with neurosurgery.  [name], MD

## 2023-08-13 NOTE — Telephone Encounter (Signed)
 Pt called regarding MRI results specifically the moderate to severe spinal stenosis and cord flattening asymmetric to the right.  Referral to Neurosurgery was placed earlier this wk.  Pt was contacted earlier today regarding appt: Dr. Fabiola Backer 4/5 at 9:45 am.   Abbe Amsterdam, MD

## 2023-08-17 ENCOUNTER — Other Ambulatory Visit: Payer: Self-pay

## 2023-08-17 MED ORDER — ROSUVASTATIN CALCIUM 40 MG PO TABS: 40.0000 mg | ORAL_TABLET | Freq: Every day | ORAL | 3 refills | Status: DC

## 2023-08-17 MED ORDER — ROSUVASTATIN CALCIUM 40 MG PO TABS: 40.0000 mg | ORAL_TABLET | Freq: Every day | ORAL | 3 refills | Status: AC

## 2023-08-31 ENCOUNTER — Ambulatory Visit (INDEPENDENT_AMBULATORY_CARE_PROVIDER_SITE_OTHER): Admitting: Podiatry

## 2023-08-31 ENCOUNTER — Encounter: Payer: Self-pay | Admitting: Podiatry

## 2023-08-31 VITALS — Ht 68.0 in | Wt 157.2 lb

## 2023-08-31 DIAGNOSIS — M79672 Pain in left foot: Secondary | ICD-10-CM

## 2023-08-31 DIAGNOSIS — Q828 Other specified congenital malformations of skin: Secondary | ICD-10-CM

## 2023-08-31 DIAGNOSIS — M79671 Pain in right foot: Secondary | ICD-10-CM

## 2023-09-02 DIAGNOSIS — M4722 Other spondylosis with radiculopathy, cervical region: Secondary | ICD-10-CM | POA: Diagnosis not present

## 2023-09-02 DIAGNOSIS — G5602 Carpal tunnel syndrome, left upper limb: Secondary | ICD-10-CM | POA: Diagnosis not present

## 2023-09-02 DIAGNOSIS — G5601 Carpal tunnel syndrome, right upper limb: Secondary | ICD-10-CM | POA: Diagnosis not present

## 2023-09-04 NOTE — Progress Notes (Signed)
  Subjective:  Patient ID: Angela Pugh, female    DOB: 04-24-1946,  MRN: [id number]  Angela Pugh presents to clinic today for follow up and states left great toe lesion is tender and it is difficult to walk without.   Chief Complaint  Patient presents with   Toe Pain    Pt is here due to left great toe pain states it has been hurting for a couple of weeks no injury to toe, states she is a diabetic and wanted to get it checked out.   New problem(s): None.   PCP is Viola Greulich, MD.  Allergies  Allergen Reactions   Symbicort  [Budesonide -Formoterol  Fumarate] Shortness Of Breath, Swelling and Other (See Comments)    Pharyngeal edema, hoarseness, tightness in chest, lips became dark.   Acetaminophen  Hives and Nausea Only   Azithromycin Hives and Nausea And Vomiting   Lipitor [Atorvastatin] Nausea Only    weakness   Pregabalin  Nausea And Vomiting    dizziness   Zyrtec [Cetirizine] Hives    Review of Systems: Negative except as noted in the HPI.  Objective: No changes noted in today's physical examination. There were no vitals filed for this visit. Angela Pugh is a pleasant 78 y.o. female WD, WN in NAD. AAO x 3.  Vascular Examination: CFT <3 seconds b/l. DP pulses faintly palpable right foot; nonpalpable left foot. PT pulses faintly palpable b/l. Skin temperature gradient warm to warm b/l. No pain with calf compression. No ischemia or gangrene. No cyanosis or clubbing noted b/l. Pedal hair absent.   Neurological Examination: Sensation grossly intact b/l with 10 gram monofilament. Vibratory sensation intact b/l. Pt has subjective symptoms of neuropathy.  Dermatological Examination: Pedal skin warm and supple b/l.   No open wounds. No interdigital macerations.  Toenails 1-5 b/l thick, discolored, elongated with subungual debris and pain on dorsal palpation.    Porokeratotic lesion(s) plantar heel pad of right foot, posterolateral aspect of right heel,  submet head 2 left foot, and submet head 2 right foot. No erythema, no edema, no drainage, no fluctuance.  Musculoskeletal Examination: Muscle strength 5/5 to all lower extremity muscle groups bilaterally. Hammertoe(s) submet head 2 left foot and submet head 2 right foot.  Radiographs: None  Assessment/Plan: 1. Porokeratosis   2. Pain in both feet    -As a courtesy, porokeratotic lesion(s) of both feet pared and enucleated without complication or incident. -Patient/POA to call should there be question/concern in the interim. Keep scheduled future appointment.  Angela Pugh, DPM      [facility] LOCATION: [address] [place] [address]                   Office [phone]   [facility] LOCATION: [address] [place], [place] [address] Office [phone]

## 2023-09-07 ENCOUNTER — Telehealth: Payer: Self-pay | Admitting: Orthopedic Surgery

## 2023-09-07 MED ORDER — TRAMADOL HCL 50 MG PO TABS: 50.0000 mg | ORAL_TABLET | Freq: Four times a day (QID) | ORAL | 0 refills | Status: AC | PRN

## 2023-09-07 NOTE — Telephone Encounter (Signed)
 Patient called and wanted to know if you could call her something in for pain. CB#[phone]

## 2023-09-07 NOTE — Addendum Note (Signed)
 Addended by: Colette Davies on: 09/07/2023 02:13 PM   Modules accepted: Orders

## 2023-09-27 ENCOUNTER — Other Ambulatory Visit: Payer: Self-pay | Admitting: Family Medicine

## 2023-09-27 DIAGNOSIS — J302 Other seasonal allergic rhinitis: Secondary | ICD-10-CM

## 2023-10-04 ENCOUNTER — Ambulatory Visit: Admitting: Orthopedic Surgery

## 2023-10-04 DIAGNOSIS — M1712 Unilateral primary osteoarthritis, left knee: Secondary | ICD-10-CM | POA: Diagnosis not present

## 2023-10-04 MED ORDER — MORPHINE SULFATE 15 MG PO TABS: 7.5000 mg | ORAL_TABLET | ORAL | 0 refills | Status: AC | PRN

## 2023-10-04 NOTE — Progress Notes (Signed)
 Orthopedic Surgery Post-operative Office Visit   Procedure: L3-5 laminectomy and PSIF Date of Surgery: 12/08/2022 (~10 months post-op)   Assessment: Patient is a 78 y.o. who is having acute exacerbation of her chronic low back pain. Also, reporting left knee pain. Has done well with prior injections     Plan: -Operative plans complete -No spine specific precautions at this time -Prescribed morphine  7.5mg  to be used sparingly since that has helped her in the past. Could add prednisone  if her pain does not improve with the morphine  -Patient interested in repeat left knee injection which was done today in the office -Return to office in 2 months, x-rays needed at next visit: AP/lateral/flex/ex lumbar  Left knee injection note: After discussing the risk, benefits, and alternatives of left knee intra-articular injection, patient elected to proceed.  The patient was in the seated position with the knee at 90 degrees.  The anterior lateral soft spot of the left knee was prepped with an alcohol based prep.  Ethyl chloride was used to anesthetize the skin.  A 20-gauge needle was used to inject 1 cc of Depo-Medrol , 1 cc of lidocaine , 1 cc of bupivacaine  under standard sterile technique.  Needle was withdrawn and Band-Aid was applied.  Patient tolerated the procedure well. ___________________________________________________________________________     Subjective: Patient is not having any radiating leg pain at this point.  She has had chronic low back pain and said it recently has gotten worse.  She feels in the lower lumbar region.  She notes it is worse with activity and improves with rest.  She said there are some days where she feels significant pain and other days she is feels like she is in her 15s again with no pain in her back.  She currently is having more significant pain.  She is also having left knee pain.  She feels it along the lateral joint line.  There was no trauma or injury that preceded  the onset of pain.  She has had prior knee injections and was interested in a repeat one today.     Objective:   General: no acute distress, appropriate affect Neurologic: alert, answering questions appropriately, following commands Respiratory: unlabored breathing on room air Skin: incision is well healed   MSK (spine):   -Strength exam                                                   Left                  Right   EHL                              5/5                  5/5 TA                                 5/5                  5/5 GSC                             5/5  5/5 Knee extension            5/5                  5/5 Hip flexion                    5/5                  5/5   -Sensory exam                           Sensation intact to light touch in L3-S1 nerve distributions of bilateral lower extremities     Imaging: X-rays of the lumbar spine taken 06/03/2023 were previously independently reviewed and interpreted, showing posterior instrumentation from L3-L5.  No lucency seen around the screws.  No the screws backed out.  No translation seen or evidence of instability on flexion/extension views.  Laminectomy defect from L3-5 seen.  No fracture or dislocation noted.     Patient name: Angela Pugh Patient MRN: [id number] Date of visit: 10/04/23

## 2023-10-21 ENCOUNTER — Ambulatory Visit (INDEPENDENT_AMBULATORY_CARE_PROVIDER_SITE_OTHER): Admitting: Nurse Practitioner

## 2023-10-21 ENCOUNTER — Other Ambulatory Visit (INDEPENDENT_AMBULATORY_CARE_PROVIDER_SITE_OTHER)

## 2023-10-21 ENCOUNTER — Other Ambulatory Visit: Payer: Self-pay | Admitting: Nurse Practitioner

## 2023-10-21 ENCOUNTER — Encounter: Payer: Self-pay | Admitting: Nurse Practitioner

## 2023-10-21 VITALS — BP 120/60 | HR 56 | Ht 66.0 in | Wt 156.2 lb

## 2023-10-21 DIAGNOSIS — K5730 Diverticulosis of large intestine without perforation or abscess without bleeding: Secondary | ICD-10-CM

## 2023-10-21 DIAGNOSIS — R634 Abnormal weight loss: Secondary | ICD-10-CM

## 2023-10-21 DIAGNOSIS — G8929 Other chronic pain: Secondary | ICD-10-CM

## 2023-10-21 DIAGNOSIS — R0989 Other specified symptoms and signs involving the circulatory and respiratory systems: Secondary | ICD-10-CM

## 2023-10-21 DIAGNOSIS — R1013 Epigastric pain: Secondary | ICD-10-CM

## 2023-10-21 LAB — COMPREHENSIVE METABOLIC PANEL WITH GFR
ALT: 8 U/L (ref 0–35)
AST: 12 U/L (ref 0–37)
Albumin: 4.1 g/dL (ref 3.5–5.2)
Alkaline Phosphatase: 67 U/L (ref 39–117)
BUN: 13 mg/dL (ref 6–23)
CO2: 26 meq/L (ref 19–32)
Calcium: 9.7 mg/dL (ref 8.4–10.5)
Chloride: 104 meq/L (ref 96–112)
Creatinine, Ser: 1.11 mg/dL (ref 0.40–1.20)
GFR: 48.07 mL/min — ABNORMAL LOW (ref >60.00)
Glucose, Bld: 133 mg/dL — ABNORMAL HIGH (ref 70–99)
Potassium: 4.2 meq/L (ref 3.5–5.1)
Sodium: 142 meq/L (ref 135–145)
Total Bilirubin: 0.5 mg/dL (ref 0.2–1.2)
Total Protein: 7.7 g/dL (ref 6.0–8.3)

## 2023-10-21 LAB — CBC WITH DIFFERENTIAL/PLATELET
Basophils Absolute: 0.1 10*3/uL (ref 0.0–0.1)
Basophils Relative: 1.2 % (ref 0.0–3.0)
Eosinophils Absolute: 0.1 10*3/uL (ref 0.0–0.7)
Eosinophils Relative: 2.8 % (ref 0.0–5.0)
HCT: 42 % (ref 36.0–46.0)
Hemoglobin: 14 g/dL (ref 12.0–15.0)
Lymphocytes Relative: 42.8 % (ref 12.0–46.0)
Lymphs Abs: 1.9 10*3/uL (ref 0.7–4.0)
MCHC: 33.3 g/dL (ref 30.0–36.0)
MCV: 93.5 fl (ref 78.0–100.0)
Monocytes Absolute: 0.4 10*3/uL (ref 0.1–1.0)
Monocytes Relative: 9.6 % (ref 3.0–12.0)
Neutro Abs: 1.9 10*3/uL (ref 1.4–7.7)
Neutrophils Relative %: 43.6 % (ref 43.0–77.0)
Platelets: 219 10*3/uL (ref 150.0–400.0)
RBC: 4.49 Mil/uL (ref 3.87–5.11)
RDW: 14.2 % (ref 11.5–15.5)
WBC: 4.4 10*3/uL (ref 4.0–10.5)

## 2023-10-21 MED ORDER — FAMOTIDINE 20 MG PO TABS: 20.0000 mg | ORAL_TABLET | Freq: Every day | ORAL | 1 refills | Status: DC

## 2023-10-21 NOTE — Progress Notes (Signed)
 10/21/2023 Angela Pugh [id number] 01/24/1946   Chief Complaint: Abdominal pain   History of Present Illness: Angela Pugh is a 78 year old female with a past medical history of of arthritis, hypertension, hyperlipidemia, LBBB, DM type II, migraine headaches, vertigo, GERD, H. Pylori gastritis, gastric ulcer, gastric focal intestinal metaplasia without dysplasia, duodenal AVM, diverticulosis with suspected SCAD on Lialda . S/P laminectomy L3-5 11/2022. She is known by Dr. Karene Oto.   She continues to have generalized abdominal bloat and chronic epigastric pain which is fairly constant. Can't sit upright because it hurts so bad. She has nausea without vomiting. She takes Gas X as needed, sometimes reduces gas and other times no improvement. She is having acid reflux symptoms for the past month. She takes Pantoprazole  40mg  every day and Sucralfate  3 t o4 times daily. She is passing a BM 1 to 5 times daily. Stools are brown formed or mushy. No bloody or black stools. She remains on Mesalamine  4.8gm po every day for SCAD. Decreased appetite. She stated her taste buds don't work right, most foods taste bad. She has lost 20lbs since 05/2023. Past smoker.   Her most recent EGD 07/09/2022 showed a vascular bleb in the distal esophagus, mild chronic gastritis without evidence of H. Pylori or intestinal metaplasia, reactive duodenal mucosa with focal gastric metaplasia consistent with peptic duodenitis.   Her most recent colonoscopy 07/06/2023 showed diverticulosis in the sigmoid colon, a tortuous colon and stool was in the entire examined colon with consideration to repeat a colonoscopy with an ultraslim colonoscope.  RUQ sono done due to epigastric pain showed a liver cyst and a normal gallbladder without biliary ductal dilatation.   Prior CTAP 04/01/2023 with contrast done due to LLQ pain showed diverticulosis without evidence of diverticulitis.   CTA 06/29/2022 showed greater than 50% ostial  stenoses of the main renal arteries bilaterally, superimposed changes of fibromuscular dysplasia and 50% stenosis of the celiac artery origin related to mass effect from the adjacent median arcuate ligament.     Latest Ref Rng & Units 06/20/2023    5:09 PM 05/25/2023   11:34 AM 04/01/2023   10:43 PM  CBC  WBC 4.0 - 10.5 K/uL 7.9  6.0  7.5   Hemoglobin 12.0 - 15.0 g/dL 08.6  57.8  46.9   Hematocrit 36.0 - 46.0 % 39.9  46.2  44.0   Platelets 150 - 400 K/uL 237  315.0  234        Latest Ref Rng & Units 06/20/2023    5:09 PM 05/25/2023   11:34 AM 04/01/2023   10:43 PM  CMP  Glucose 70 - 99 mg/dL 629  528  413   BUN 8 - 23 mg/dL 10  11  8    Creatinine 0.44 - 1.00 mg/dL 2.44  0.10  2.72   Sodium 135 - 145 mmol/L 141  139  137   Potassium 3.5 - 5.1 mmol/L 2.9  3.8  3.6   Chloride 98 - 111 mmol/L 102  102  102   CO2 22 - 32 mmol/L 29  28  26    Calcium  8.9 - 10.3 mg/dL 8.8  9.7  9.8   Total Protein 6.0 - 8.3 g/dL  8.0  8.0   Total Bilirubin 0.2 - 1.2 mg/dL  0.4  0.7   Alkaline Phos 39 - 117 U/L  84  75   AST 0 - 37 U/L  13  11   ALT 0 - 35 U/L  8  8     RUQ sonogram 06/01/2023: FINDINGS: Gallbladder:   No gallstones or wall thickening visualized. No sonographic Murphy sign noted by sonographer.   Common bile duct:   Diameter: 2.8 mm.   Liver:   1.6 x 1.2 x 2.3 cm simple cyst noted in the right lobe liver correlating to the CT finding. No follow-up is recommended. Within normal limits in parenchymal echogenicity. Portal vein is patent on color Doppler imaging with normal direction of blood flow towards the liver.  CTAP with contrast 04/01/2023: FINDINGS: Lower chest: Lung bases are clear.   Hepatobiliary: 2.2 cm central hepatic cyst (series 2/image 13), benign.   Gallbladder is unremarkable. No intrahepatic or extrahepatic dilatation.   Pancreas: Within normal limits.   Spleen: Within normal limits but   Adrenals/Urinary Tract: Adrenal glands are within normal  limits.   Kidneys are within normal limits.  No hydronephrosis.   Bladder is within normal limits.   Stomach/Bowel: Stomach is within normal limits.   No evidence of bowel obstruction.   Normal appendix (series 2/image 86).   Sigmoid diverticulosis, without evidence of diverticulitis.   Vascular/Lymphatic: No evidence of abdominal aortic aneurysm.   Atherosclerotic calcifications of the abdominal aorta and branch vessels, although vessels remain patent.   No suspicious abdominopelvic lymphadenopathy.   Reproductive: Status post hysterectomy.   No adnexal masses.   Other: No abdominopelvic ascites.   Musculoskeletal: Status post PLIF at L3-5. Mild degenerative changes of the visualized thoracolumbar spine.   IMPRESSION: Sigmoid diverticulosis, without evidence of diverticulitis.   No CT findings to account for the patient's left lower quadrant abdominal pain.  CTA 06/29/2022:  FINDINGS: CTA CHEST FINDINGS   Cardiovascular: The thoracic aorta is normal in course and caliber. No intramural hematoma, aneurysm, or dissection. Arch vasculature demonstrates normal anatomic configuration and is widely patent proximally. There is tortuosity of the common carotid arteries noted bilaterally, however, with resultant high-grade (greater than 75%) stenosis of the proximal right common carotid artery and axial image # 17/6.   Mild coronary artery calcification. Global cardiac size within normal limits. No pericardial effusion. Central pulmonary arteries are of normal caliber.   Mediastinum/Nodes: No enlarged mediastinal, hilar, or axillary lymph nodes. Thyroid  gland, trachea, and esophagus demonstrate no significant findings.   Lungs/Pleura: Mild centrilobular and paraseptal emphysema. Bibasilar dependent ground-glass opacity is new from prior examination and is nonspecific but may simply represent atelectasis. No pneumothorax or pleural effusion. No central obstructing  lesion. 3 mm sub solid pulmonary nodule within the a right middle lobe, axial image # 79/5, indeterminate.   Musculoskeletal: Degenerative changes are seen within the thoracic spine. No acute bone abnormality. No lytic or blastic bone lesion.   Review of the MIP images confirms the above findings.   CTA ABDOMEN AND PELVIS FINDINGS   VASCULAR   Aorta: Moderate mixed atherosclerotic plaque within the abdominal aorta. No aneurysm or dissection. No periaortic inflammatory change or fluid collection identified.   Celiac: 50% stenosis at the origin related to mass effect from the adjacent median arcuate ligament. Otherwise widely patent. No aneurysm or dissection.   SMA: Patent without evidence of aneurysm, dissection, vasculitis or significant stenosis.   Renals: Main right and single left renal arteries demonstrate a beaded appearance of their mid segments, right greater than left, in keeping with changes of fibromuscular dysplasia. There is superimposed greater than 50% stenoses of these vessels at the ostia. Tiny accessory right renal artery noted.   IMA: Greater than 50% stenosis at  the origin.  Distally patent.   Inflow: Patent without evidence of aneurysm, dissection, vasculitis or significant stenosis.   Veins: No obvious venous abnormality within the limitations of this arterial phase study.   Review of the MIP images confirms the above findings.   NON-VASCULAR   Hepatobiliary: Multiple cysts within the left hepatic lobe, decreased in size since prior examination. Liver otherwise unremarkable. Gallbladder unremarkable. No intra or extrahepatic biliary ductal dilation.   Pancreas: Unremarkable   Spleen: Unremarkable   Adrenals/Urinary Tract: Adrenal glands are unremarkable. Kidneys are normal, without renal calculi, focal lesion, or hydronephrosis. Bladder is unremarkable.   Stomach/Bowel: Severe sigmoid diverticulosis without superimposed acute inflammatory  change. Mild scattered diverticular seen within the ascending colon. Stomach, small bowel, and large bowel are otherwise unremarkable. No evidence of obstruction or focal inflammation. Appendix normal. No free intraperitoneal gas or fluid.   Lymphatic: No pathologic adenopathy within the abdomen and pelvis.   Reproductive: Status post hysterectomy. No adnexal masses.   Other: No abdominal wall hernia or abnormality. No abdominopelvic ascites.   Musculoskeletal: No acute bone abnormality. Degenerative changes are seen within the lumbar spine and hips bilaterally. No lytic or blastic bone lesion.   Review of the MIP images confirms the above findings.   IMPRESSION: 1. No evidence of thoracoabdominal aortic aneurysm or dissection. 2. High-grade stenosis of the proximal right common carotid artery. Correlation with carotid artery duplex sonography may be helpful for further evaluation. 3. Mild coronary artery calcification. 4. Mild centrilobular and paraseptal emphysema. 5. 3 mm sub solid pulmonary nodule within the right middle lobe. No follow-up recommended. This recommendation follows the consensus statement: Guidelines for Management of Incidental Pulmonary Nodules Detected on CT Images: From the Fleischner Society 2017; Radiology 2017; 284:228-243. 6. Severe sigmoid diverticulosis without superimposed acute inflammatory change. 7. Greater than 50% ostial stenoses of the main renal arteries bilaterally. Superimposed changes of fibromuscular dysplasia. 8. 50% stenosis of the celiac artery origin related to mass effect from the adjacent median arcuate ligament.   Aortic Atherosclerosis and Emphysema    PAST GI PROCEDURES:  Colonoscopy 07/06/2023: - Preparation of the colon was fair.  - Diverticulosis in the sigmoid colon with localized area of mucosal hypertrophy. Biopsied.  - Tortuous colon. - Stool in the entire examined colon. - Non-bleeding internal hemorrhoids. Repeat  colonoscopy for surveillance based on pathology results. - If planning repeat colonoscopy, I recommend using an Ultraslim colonoscope.  1. Surgical [P], colon, sigmoid :       COLONIC MUCOSA SHOWING FOCAL REGENERATIVE CHANGES (SEE MICROSCOPIC COMMENT)  Focal regenerative changes.  This is essentially healing mucosa in the sigmoid colon in the same area of the previously diagnosed segmental colitis (diverticular associated colitis).  There was otherwise no evidence of dysplasia, carcinoma, or other worrisome features.  No specific follow-up needed for this finding.  Can otherwise follow-up in the GI clinic if ongoing abdominal pain or other issues.    EGD 07/09/2022: - Vascular bleb found in the distal esophagus. - Non-obstructing Schatzki ring. Fractured with forceps. - Normal stomach. Biopsied.  - Erythematous duodenopathy. Biopsied.  - Normal second portion of the duodenum.1. Surgical [P], duodenal REACTIVE DUODENAL MUCOSA WITH FOCAL GASTRIC METAPLASIA COMPATIBLE WITH PEPTIC DUODENITIS 2. Surgical [P], gastric antrum REACTIVE GASTROPATHY WITH MILD CHRONIC GASTRITIS NEGATIVE FOR H. PYLORI, INTESTINAL METAPLASIA, DYSPLASIA AND CARCINOMA 3. Surgical [P], gastric body MINIMAL CHRONIC GASTRITIS WITH LYMPHOID AGGREGATE NEGATIVE FOR H. PYLORI, INTESTINAL METAPLASIA, DYSPLASIA AND CARCINOMA   EGD 02/03/2022: - Normal upper third of  esophagus and middle third of esophagus.  - Bleb found in the esophagus.  - Non-obstructing Schatzki ring. Given location in close proximity to the esophageal bleb, I elected to treat with cold forceps for ring fracturing.  - Mild, non-ulcer gastritis. Biopsied.  - A single bleeding angioectasia in the duodenum. Clips (MR conditional) were placed and cauterized with the tip of the snare.  - Normal mucosa was found in the duodenal bulb, in the first portion of the duodenum and in the second portion of the duodenum.  Surgical [P], gastric body - CHRONIC ACTIVE  GASTRITIS - POSITIVE FOR H. PYLORI ON H AND E STAIN - FOCAL INTESTINAL METAPLASIA - NEGATIVE FOR MALIGNANCY   EGD 04/26/2020: - Mild, non-specific distal gastritis, biopsied to check for H. pylori.  - Small 'blue bleb' vascular finding in the distal esophagus of doubtful clinical importance.  - The examination was otherwise normal. - GASTRIC ANTRAL AND OXYNTIC MUCOSA WITH MILD CHRONIC GASTRITIS - FOCAL INTESTINAL METAPLASIA OF ANTRAL MUCOSA, NEGATIVE FOR DYSPLASIA - WARTHIN STARRY STAIN IS NEGATIVE FOR HELICOBACTER PYLORI   Colonoscopy 04/26/2020: - Signficant chronic diverticular changes (luminal narrowing, erythema and spasticity). I suspect this is the cause of her abdominal pains.  - The examination was otherwise normal on direct and retroflexion views.  - No polyps or cancers   Colonoscopy 2018: - Sigmoid diverticulosis     Current Outpatient Medications on File Prior to Visit  Medication Sig Dispense Refill   ABRYSVO 120 MCG/0.5ML injection      amLODipine  (NORVASC ) 5 MG tablet TAKE 1 TABLET(5 MG) BY MOUTH DAILY 90 tablet 0   aspirin  81 MG EC tablet Take 81 mg by mouth daily. Swallow whole.     Aspirin -Acetaminophen -Caffeine (GOODY HEADACHE PO) Take 1 packet by mouth daily as needed (headaches).     Blood Glucose Monitoring Suppl DEVI 1 each by Does not apply route in the morning, at noon, and at bedtime. May substitute to any manufacturer covered by patient's insurance. 1 each 0   Calcium  Carb-Cholecalciferol  (CALCIUM  500 + D PO) Take 1 tablet by mouth daily.     clobetasol  ointment (TEMOVATE ) 0.05 % Apply 1 Application topically 2 (two) times daily. (Patient taking differently: Apply 1 Application topically 2 (two) times daily as needed (rash).) 30 g 0   cyclobenzaprine  (FLEXERIL ) 10 MG tablet Take 1 tablet (10 mg total) by mouth 2 (two) times daily as needed for muscle spasms. 20 tablet 0   dicyclomine  (BENTYL ) 10 MG capsule Take 1 capsule (10 mg total) by mouth 4 (four) times  daily as needed for spasms (for abdominal pain). 90 capsule 3   ferrous sulfate  325 (65 FE) MG EC tablet Take 325 mg by mouth daily.     Glucose Blood (BLOOD GLUCOSE TEST STRIPS) STRP 1 each by In Vitro route in the morning, at noon, and at bedtime. May substitute to any manufacturer covered by patient's insurance. 100 strip 11   guaiFENesin (MUCINEX) 600 MG 12 hr tablet Take by mouth 2 (two) times daily.     Lancets Misc. MISC 1 each by Does not apply route in the morning, at noon, and at bedtime. May substitute to any manufacturer covered by patient's insurance. 100 each 11   loratadine  (CLARITIN ) 10 MG tablet TAKE 1 TABLET(10 MG) BY MOUTH DAILY 30 tablet 11   meclizine  (ANTIVERT ) 25 MG tablet Take 1 tablet (25 mg total) by mouth 3 (three) times daily as needed for dizziness. 20 tablet 1   mesalamine  (LIALDA )  1.2 g EC tablet TAKE 4 TABLETS(4.8 GRAMS) BY MOUTH DAILY WITH BREAKFAST 360 tablet 0   morphine  (MSIR) 15 MG tablet Take 7.5 mg by mouth as needed.     Multiple Vitamins-Minerals (CENTRUM ADULTS PO) Take 1 tablet by mouth daily.      NON FORMULARY Diltiazem 2%/Lidocaine5% compound Use 3 x rectally daily for 2 months to heal anal fissure 30 g 1   olmesartan  (BENICAR ) 5 MG tablet Take 2 tablets (10 mg total) by mouth daily. 180 tablet 3   Omega-3 Fatty Acids (FISH OIL PO) Take 700 mg by mouth daily.     pantoprazole  (PROTONIX ) 40 MG tablet TAKE 1 TABLET(40 MG) BY MOUTH TWICE DAILY BEFORE A MEAL 180 tablet 3   potassium chloride  SA (KLOR-CON  M) 20 MEQ tablet Take 2 tablets (40 mEq total) by mouth daily. 180 tablet 1   PREDNISOLON-MOXIFLOX-BROMFENAC OP Place 1 drop into both eyes daily as needed (irritation).     rosuvastatin  (CRESTOR ) 40 MG tablet Take 1 tablet (40 mg total) by mouth daily. 90 tablet 3   sucralfate  (CARAFATE ) 1 g tablet TAKE 1 TABLET(1 GRAM) BY MOUTH FOUR TIMES DAILY- WITH EACH MEAL AND AT BEDTIME 360 tablet 0   triamcinolone  cream (KENALOG ) 0.5 % Apply topically 2 (two) times  daily. 30 g 3   vitamin B-12 (CYANOCOBALAMIN ) 1000 MCG tablet Take 1,000 mcg by mouth daily.     Current Facility-Administered Medications on File Prior to Visit  Medication Dose Route Frequency Provider Last Rate Last Admin   diclofenac  Sodium (VOLTAREN ) 1 % topical gel 2 g  2 g Topical PRN        Allergies  Allergen Reactions   Symbicort  [Budesonide -Formoterol  Fumarate] Shortness Of Breath, Swelling and Other (See Comments)    Pharyngeal edema, hoarseness, tightness in chest, lips became dark.   Acetaminophen  Hives and Nausea Only   Azithromycin Hives and Nausea And Vomiting   Lipitor [Atorvastatin] Nausea Only    weakness   Pregabalin  Nausea And Vomiting    dizziness   Zyrtec [Cetirizine] Hives   Current Medications, Allergies, Past Medical History, Past Surgical History, Family History and Social History were reviewed in [employer] record.  Review of Systems:   Constitutional: Negative for fever, sweats, chills or weight loss.  Respiratory: Negative for shortness of breath.   Cardiovascular: Negative for chest pain, palpitations and leg swelling.  Gastrointestinal: See HPI.  Musculoskeletal: Negative for back pain or muscle aches.  Neurological: Negative for dizziness, headaches or paresthesias.   Physical Exam:  BP 120/60 (BP Location: Left Arm, Patient Position: Sitting, Cuff Size: Normal)   Pulse (!) 56   Ht 5\' 6"  (1.676 m) Comment: height measured without shoes shoes  Wt 156 lb 4 oz (70.9 kg)   BMI 25.22 kg/m   Wt Readings from Last 3 Encounters:  10/21/23 156 lb 4 oz (70.9 kg)  08/31/23 157 lb 3.2 oz (71.3 kg)  08/11/23 157 lb 3.2 oz (71.3 kg)  05/25/23         176 lbs  General: 78 year old female in no acute distress. Head: Normocephalic and atraumatic. Eyes: No scleral icterus. Conjunctiva pink . Ears: Normal auditory acuity. Mouth: Dentition intact. No ulcers or lesions.  Lungs: Clear throughout to auscultation. Heart: Regular rate  and rhythm, soft murmur.  Abdomen: Soft, nontender and nondistended. No masses or hepatomegaly. Soft, nondistended. Epigastric tenderness without rebound or guarding. Normal bowel sounds x 4 quadrants. Bruit auscultated to the epigastrium. Rectal: Deferred.  Musculoskeletal: Symmetrical with no gross deformities. Extremities: No edema. Neurological: Alert oriented x 4. No focal deficits.  Psychological: Alert and cooperative. Normal mood and affect  Assessment and Recommendations:  Chronic epigastric pain in setting of history of GERD, H. Pylori gastritis, gastric ulcer and gastric intestinal metaplasia on PPI and Sucralfate . Her most recent EGD 07/09/2022 showed a vascular bleb in the distal esophagus, mild chronic gastritis without evidence of H. Pylori or intestinal metaplasia, reactive duodenal mucosa with focal gastric metaplasia consistent with peptic duodenitis. RUQ sono showed a normal gallbladder without biliary ductal dilatation. Query possible vascular disease, abdominal ischemia with bruit to the epigastrium on exam today. Prior CTA  06/29/2022 50% stenosis of the celiac artery origin related to mass effect from the adjacent median arcuate ligament. Possible median arcuate ligament syndrome in setting of 20lb weight loss.  -Diatherix H. Pylori stool antigen  -CBC, CMP -Refer to vascular surgery, consider celiac/mesenteric doppler study, rule out abdominal ischemia as etiology for chronic epigastric pain -Stop Sucralfate   -Continue Pantoprazole  40mg  bid  -Start Famotidine  20mg  one tab at bed time  -Repeat EGD deferred for now  20 lb weight loss x 5 months -See plan above   Diverticulosis with suspected SCAD on Mesalamine . Her most recent colonoscopy 07/06/2023 showed diverticulosis in the sigmoid colon, a tortuous colon and stool was in the entire examined colon with consideration to repeat a colonoscopy with an ultraslim colonoscope. -Dr. Karene Oto to verify if a repeat colonoscopy  to be pursued at this time  -Continue Mesalamine  4.8 gm po QD

## 2023-10-21 NOTE — Patient Instructions (Signed)
 Your provider has requested that you go to the basement level for lab work before leaving today. Press "B" on the elevator. The lab is located at the first door on the left as you exit the elevator.  We have sent the following medications to your pharmacy for you to pick up at your convenience: Famotidine  20mg  once daily at bedtime  Continue Pantoprazole  40mg  twice daily   Stop Sulcrafate   Your provider has ordered "[employer]" stool testing for you. You have received a kit from our office today containing all necessary supplies to complete this test. Please carefully read the stool collection instructions provided in the kit before opening the accompanying materials. In addition, be sure there is a label providing your full name and date of birth on the "puritan opti-swab" tube that is supplied in the kit (if you do not see a label with this information on your test tube, please make us  aware before test collection!). After completing the test, you should secure the purtian tube into the specimen biohazard bag. The [facility] E-Req sheet (including date and time of specimen collection) should be placed into the outside pocket of the specimen biohazard bag and returned to the [place] lab (basement floor of [facility]) within 3 days of collection. Please make sure to give the specimen to a staff member at the lab. DO NOT leave the specimen on the counter.  If the specimen date and time (can be found in the upper right boxed portion of the sheet) are not filled out on the E-Req sheet, the test will NOT be performed.  Due to recent changes in healthcare laws, you may see the results of your imaging and laboratory studies on MyChart before your provider has had a chance to review them.  We understand that in some cases there may be results that are confusing or concerning to you. Not all laboratory results come back in the same time frame and the provider may be waiting for  multiple results in order to interpret others.  Please give us  48 hours in order for your provider to thoroughly review all the results before contacting the office for clarification of your results.   Thank you for trusting me with your gastrointestinal care!    Everett Hitt, NP  We have referred you to a Vascular Surgeon they will give you a call to schedule an appointment.  _______________________________________________________  If your blood pressure at your visit was 140/90 or greater, please contact your primary care physician to follow up on this.  _______________________________________________________  If you are age 30 or older, your body mass index should be between 23-30. Your Body mass index is 25.22 kg/m. If this is out of the aforementioned range listed, please consider follow up with your Primary Care Provider.  If you are age 73 or younger, your body mass index should be between 19-25. Your Body mass index is 25.22 kg/m. If this is out of the aformentioned range listed, please consider follow up with your Primary Care Provider.   ________________________________________________________  The [place] [facility] providers would like to encourage you to use MYCHART to communicate with providers for non-urgent requests or questions.  Due to long hold times on the telephone, sending your provider a message by [facility] may be a faster and more efficient way to get a response.  Please allow 48 business hours for a response.  Please remember that this is for non-urgent requests.  _______________________________________________________

## 2023-10-22 ENCOUNTER — Ambulatory Visit: Payer: Self-pay | Admitting: Nurse Practitioner

## 2023-10-25 DIAGNOSIS — H00021 Hordeolum internum right upper eyelid: Secondary | ICD-10-CM | POA: Diagnosis not present

## 2023-10-25 NOTE — Progress Notes (Signed)
 Agree with the assessment and plan as outlined by Everett Hitt, NP.   Agree with plan to refer to Vascular Surgery Clinic due to CTA findings and ongoing abdominal pain with associated wt loss. Other diagnostic options include HIDA scan to r/o atypical biliary dyskinesia. If all unrevealing, we may also want to refer to Colorectal Surgery, as I would be curious if the ongoing peridiverticular inflammatory changes and mucosal hypertrophy seen on colonoscopy are the source for ongoing bleed and could consider segmental resection.    Harry Lindau, DO, [facility]

## 2023-10-26 ENCOUNTER — Telehealth: Payer: Self-pay

## 2023-10-26 NOTE — Telephone Encounter (Signed)
 27 pages faxed to [facility],  Fax confirmation was received.

## 2023-10-26 NOTE — Telephone Encounter (Signed)
-----   Message from Annis Kinder sent at 10/25/2023  3:59 PM EDT ----- Can you send a referral to Dr. Garret Kales at [facility] for this patient to discuss potential sigmoid resection for chronic segmental colitis.  I already discussed the case with him and he thinks a referral to him would be appropriate to at least discuss the risks and benefits of surgery.  Thank you.  Marlin Simmonds, just FYI that I will loop in [facility] on her and they will discuss segmental resection pending Vascular Surgery eval first. Pretty interesting case.

## 2023-11-04 DIAGNOSIS — R0989 Other specified symptoms and signs involving the circulatory and respiratory systems: Secondary | ICD-10-CM | POA: Diagnosis not present

## 2023-11-04 DIAGNOSIS — R1013 Epigastric pain: Secondary | ICD-10-CM | POA: Diagnosis not present

## 2023-11-07 ENCOUNTER — Other Ambulatory Visit: Payer: Self-pay | Admitting: Physician Assistant

## 2023-11-07 DIAGNOSIS — R1013 Epigastric pain: Secondary | ICD-10-CM

## 2023-11-10 ENCOUNTER — Other Ambulatory Visit: Payer: Self-pay | Admitting: Vascular Surgery

## 2023-11-10 DIAGNOSIS — R1013 Epigastric pain: Secondary | ICD-10-CM

## 2023-11-16 ENCOUNTER — Encounter: Payer: Self-pay | Admitting: Podiatry

## 2023-11-16 ENCOUNTER — Ambulatory Visit (INDEPENDENT_AMBULATORY_CARE_PROVIDER_SITE_OTHER): Payer: Medicare Other | Admitting: Podiatry

## 2023-11-16 ENCOUNTER — Telehealth: Payer: Self-pay

## 2023-11-16 DIAGNOSIS — Q828 Other specified congenital malformations of skin: Secondary | ICD-10-CM | POA: Diagnosis not present

## 2023-11-16 DIAGNOSIS — M79674 Pain in right toe(s): Secondary | ICD-10-CM | POA: Diagnosis not present

## 2023-11-16 DIAGNOSIS — B351 Tinea unguium: Secondary | ICD-10-CM

## 2023-11-16 DIAGNOSIS — E1151 Type 2 diabetes mellitus with diabetic peripheral angiopathy without gangrene: Secondary | ICD-10-CM | POA: Diagnosis not present

## 2023-11-16 DIAGNOSIS — M79675 Pain in left toe(s): Secondary | ICD-10-CM | POA: Diagnosis not present

## 2023-11-16 NOTE — Telephone Encounter (Signed)
 Copied from CRM [id number]. Topic: Clinical - Request for Lab/Test Order >> Nov 16, 2023 11:49 AM Lavanda D wrote: Reason for CRM: Patient is requesting to have her A1c checked at her upcoming appt.

## 2023-11-16 NOTE — Telephone Encounter (Signed)
 Done

## 2023-11-16 NOTE — Progress Notes (Signed)
 Subjective:  Patient ID: Angela Pugh, female    DOB: Jan 20, 1946,  MRN: [id number]  Angela Pugh presents to clinic today for at risk foot care. Pt has h/o NIDDM with PAD and painful porokeratotic lesion(s) of both feet and painful mycotic toenails that limit ambulation. Painful toenails interfere with ambulation. Aggravating factors include wearing enclosed shoe gear. Pain is relieved with periodic professional debridement. Painful porokeratotic lesions are aggravated when weightbearing with and without shoegear. Pain is relieved with periodic professional debridement.  Chief Complaint  Patient presents with   Diabetes    She does my nails and those calluses on the bottom of my feet.  Dr. Clotilda Single - 08/11/2023; A1c -    New problem(s): None.   PCP is Single Clotilda SAUNDERS, MD.  Allergies  Allergen Reactions   Symbicort  [Budesonide -Formoterol  Fumarate] Shortness Of Breath, Swelling and Other (See Comments)    Pharyngeal edema, hoarseness, tightness in chest, lips became dark.   Acetaminophen  Hives and Nausea Only   Azithromycin Hives and Nausea And Vomiting   Lipitor [Atorvastatin] Nausea Only    weakness   Pregabalin  Nausea And Vomiting    dizziness   Zyrtec [Cetirizine] Hives    Review of Systems: Negative except as noted in the HPI.  Objective: No changes noted in today's physical examination. There were no vitals filed for this visit. Angela Pugh is a pleasant 78 y.o. female WD, WN in NAD. AAO x 3.  Vascular Examination: CFT <3 seconds b/l. DP pulses faintly palpable right foot; nonpalpable left foot. PT pulses faintly palpable b/l. Skin temperature gradient warm to warm b/l. No pain with calf compression. No ischemia or gangrene. No cyanosis or clubbing noted b/l. Pedal hair absent.   Neurological Examination: Sensation grossly intact b/l with 10 gram monofilament. Vibratory sensation intact b/l. Pt has subjective symptoms of  neuropathy.  Dermatological Examination: Pedal skin warm and supple b/l.   No open wounds. No interdigital macerations.  Toenails 1-5 b/l thick, discolored, elongated with subungual debris and pain on dorsal palpation.    Porokeratotic lesion(s) plantar hallux iPJ left foot, plantar heel pad of right foot, posterolateral aspect of right heel, submet head 2 left foot, and submet head 2 right foot. No erythema, no edema, no drainage, no fluctuance.  Musculoskeletal Examination: Muscle strength 5/5 to all lower extremity muscle groups bilaterally. Hammertoe(s) submet head 2 left foot and submet head 2 right foot.  Radiographs: None  Assessment/Plan: 1. Pain due to onychomycosis of toenails of both feet   2. Porokeratosis   3. Type II diabetes mellitus with peripheral circulatory disorder [facility])     Consent given for treatment. Patient examined. All patient's and/or POA's questions/concerns addressed on today's visit. Mycotic toenails 1-5 debrided in length and girth without incident. Porokeratotic lesion(s) plantar hallux IPJ left foot, right heel x 2, submet head 2 left foot, and submet head 2 right foot pared and enucleated with sharp debridement without incident.Continue soft, supportive shoe gear daily. Report any pedal injuries to medical professional. Call office if there are any quesitons/concerns. -Patient/POA to call should there be question/concern in the interim.   Return in about 9 weeks (around 01/18/2024).  Angela Pugh, DPM      [facility] LOCATION: [address]  [place], [place] [address]                   Office [phone]   [facility] LOCATION: [address] [place], [place] [address] Office [phone]

## 2023-11-17 NOTE — Telephone Encounter (Signed)
 Spoke with Orene at [facility] who stated patient is scheduled on 11-23-23 at 9:10am with Dr Lonni Pizza.

## 2023-11-23 DIAGNOSIS — R1013 Epigastric pain: Secondary | ICD-10-CM | POA: Diagnosis not present

## 2023-11-23 DIAGNOSIS — K5730 Diverticulosis of large intestine without perforation or abscess without bleeding: Secondary | ICD-10-CM | POA: Diagnosis not present

## 2023-11-24 ENCOUNTER — Encounter: Payer: Self-pay | Admitting: Vascular Surgery

## 2023-11-24 ENCOUNTER — Ambulatory Visit: Attending: Vascular Surgery | Admitting: Vascular Surgery

## 2023-11-24 ENCOUNTER — Ambulatory Visit (HOSPITAL_COMMUNITY)
Admission: RE | Admit: 2023-11-24 | Discharge: 2023-11-24 | Disposition: A | Discharge status: Home / Self Care | Source: Ambulatory Visit | Attending: Vascular Surgery | Admitting: Vascular Surgery

## 2023-11-24 ENCOUNTER — Ambulatory Visit: Admitting: Podiatry

## 2023-11-24 VITALS — BP 172/80 | HR 54 | Temp 97.7°F | Ht 66.0 in | Wt 156.0 lb

## 2023-11-24 DIAGNOSIS — K551 Chronic vascular disorders of intestine: Secondary | ICD-10-CM

## 2023-11-24 DIAGNOSIS — R1013 Epigastric pain: Secondary | ICD-10-CM | POA: Diagnosis not present

## 2023-11-24 NOTE — Progress Notes (Signed)
 Patient ID: Angela Pugh, female   DOB: March 08, 1946, 78 y.o.   MRN: [id number]  Reason for Consult: New Patient (Initial Visit)   Referred by Angela Pugh *  Subjective:     HPI:  Angela Pugh is a 78 y.o. female without significant history of vascular disease.  She does have type 2 diabetes and is a former smoker.  She was having significant abdominal pain with loss of bowel control and intermittent diarrhea.  She states that her abdominal pain was constant and nonradiating.  She had lost about 20 pounds from the beginning of the year until last month.  She states that recently she had a medication change her abdominal pain and bowel function is improving.  She has actually gained 1 or 2 pounds.  She does not have food fear and never really did and does not have any pain with eating at this time.  States that her activity level has increased and that her energy is much better than was previously.  At this time she really does not have any complaints other than knee pain which limits her walking.  She denies any history of stroke, TIA or amaurosis and has no personal or family history of aneurysm disease.  Past Medical History:  Diagnosis Date   Arthritis    back, knees, shoulder, hands , injections in pelvis, for bone spurs (05/04/2016)   Chronic back pain    Diverticulosis    Dysrhythmia    LBBB   GERD (gastroesophageal reflux disease)    History of kidney stones    HLD (hyperlipidemia)    Hypertension    Migraine    hx (05/04/2016)   Peripheral neuropathy    Radiculopathy    Sinus complaint    Sinus headache    occasional (05/04/2016)   Type II diabetes mellitus (HCC)    diet controlled, no meds now, was on insulin  & po treatment at one time     Vertigo    Family History  Problem Relation Age of Onset   Uterine cancer Mother    Diabetes type II Sister    Diabetes Mellitus II Brother    Diverticulitis Daughter    Heart Problems Daughter    SIDS Son     Colon cancer Neg Hx    Pancreatic cancer Neg Hx    Esophageal cancer Neg Hx    Colon polyps Neg Hx    Rectal cancer Neg Hx    Stomach cancer Neg Hx    Past Surgical History:  Procedure Laterality Date   ANTERIOR CERVICAL DECOMP/DISCECTOMY FUSION N/A 04/17/2016   Procedure: ANTERIOR CERVICAL DECOMPRESSION/DISCECTOMY FUSION CERVICAL THREE - CERVICAL FOUR, POSTERIOR CERVICAL DISCECTOMY CERVICAL SEVEN -THORASIC ONE LEFT;  Surgeon: Rockey Peru, MD;  Location: [facility] OR;  Service: Neurosurgery;  Laterality: N/A;  Anterior/Posterior   ANTERIOR FUSION CERVICAL SPINE  2000   Dr. Gaither; for bone spurs; put hardware in too   BACK SURGERY N/A 12/08/2022   COLONOSCOPY  06/10/2016   Gabem   COLONOSCOPY  2023   LASIK Bilateral    LEFT HEART CATH AND CORONARY ANGIOGRAPHY N/A 03/17/2018   Procedure: LEFT HEART CATH AND CORONARY ANGIOGRAPHY;  Surgeon: Court Dorn PARAS, MD;  Location: [facility] INVASIVE CV LAB;  Service: Cardiovascular;  Laterality: N/A;   LUMBAR LAMINECTOMY Left 06/30/2013   Procedure: Left L3-4 Extraforaminal approach to excise far lateral herniated nucleus pulposus;  Surgeon: Lynwood FORBES Better, MD;  Location: [facility] OR;  Service: Orthopedics;  Laterality: Left;  TUBAL LIGATION     VAGINAL HYSTERECTOMY      Short Social History:  Social History   Tobacco Use   Smoking status: Former    Current packs/day: 0.00    Average packs/day: 0.5 packs/day for 20.0 years (10.0 ttl pk-yrs)    Types: Cigarettes    Start date: 06/30/1962    Quit date: 06/30/1982    Years since quitting: 41.4   Smokeless tobacco: Never   Tobacco comments:    Patient only smokes in social setting when drinking. Admits former h/o heavy smoking years ago smoked 1.5 packs/day.    12/04/2021 Patient states she has not spoke for about 3 weeks    12/01/2022: pt will have a few drags if she is having a drink  Substance Use Topics   Alcohol use: Yes    Comment: special occasion    Allergies  Allergen Reactions   Symbicort   [Budesonide -Formoterol  Fumarate] Shortness Of Breath, Swelling and Other (See Comments)    Pharyngeal edema, hoarseness, tightness in chest, lips became dark.   Acetaminophen  Hives and Nausea Only   Azithromycin Hives and Nausea And Vomiting   Lipitor [Atorvastatin] Nausea Only    weakness   Pregabalin  Nausea And Vomiting    dizziness   Zyrtec [Cetirizine] Hives    Current Outpatient Medications  Medication Sig Dispense Refill   ABRYSVO 120 MCG/0.5ML injection      amLODipine  (NORVASC ) 5 MG tablet TAKE 1 TABLET(5 MG) BY MOUTH DAILY 90 tablet 0   aspirin  81 MG EC tablet Take 81 mg by mouth daily. Swallow whole.     Aspirin -Acetaminophen -Caffeine (GOODY HEADACHE PO) Take 1 packet by mouth daily as needed (headaches).     Blood Glucose Monitoring Suppl DEVI 1 each by Does not apply route in the morning, at noon, and at bedtime. May substitute to any manufacturer covered by patient's insurance. 1 each 0   Calcium  Carb-Cholecalciferol  (CALCIUM  500 + D PO) Take 1 tablet by mouth daily.     clobetasol  ointment (TEMOVATE ) 0.05 % Apply 1 Application topically 2 (two) times daily. (Patient taking differently: Apply 1 Application topically 2 (two) times daily as needed (rash).) 30 g 0   cyclobenzaprine  (FLEXERIL ) 10 MG tablet Take 1 tablet (10 mg total) by mouth 2 (two) times daily as needed for muscle spasms. 20 tablet 0   dicyclomine  (BENTYL ) 10 MG capsule Take 1 capsule (10 mg total) by mouth 4 (four) times daily as needed for spasms (for abdominal pain). 90 capsule 3   famotidine  (PEPCID ) 20 MG tablet TAKE 1 TABLET(20 MG) BY MOUTH AT BEDTIME 90 tablet 0   ferrous sulfate  325 (65 FE) MG EC tablet Take 325 mg by mouth daily.     Glucose Blood (BLOOD GLUCOSE TEST STRIPS) STRP 1 each by In Vitro route in the morning, at noon, and at bedtime. May substitute to any manufacturer covered by patient's insurance. 100 strip 11   guaiFENesin (MUCINEX) 600 MG 12 hr tablet Take by mouth 2 (two) times daily.      Lancets Misc. MISC 1 each by Does not apply route in the morning, at noon, and at bedtime. May substitute to any manufacturer covered by patient's insurance. 100 each 11   loratadine  (CLARITIN ) 10 MG tablet TAKE 1 TABLET(10 MG) BY MOUTH DAILY 30 tablet 11   meclizine  (ANTIVERT ) 25 MG tablet Take 1 tablet (25 mg total) by mouth 3 (three) times daily as needed for dizziness. 20 tablet 1   mesalamine  (LIALDA ) 1.2 g  EC tablet TAKE 4 TABLETS(4.8 GRAMS) BY MOUTH DAILY WITH BREAKFAST 360 tablet 0   morphine  (MSIR) 15 MG tablet Take 7.5 mg by mouth as needed.     Multiple Vitamins-Minerals (CENTRUM ADULTS PO) Take 1 tablet by mouth daily.      NON FORMULARY Diltiazem 2%/Lidocaine5% compound Use 3 x rectally daily for 2 months to heal anal fissure 30 g 1   olmesartan  (BENICAR ) 5 MG tablet Take 2 tablets (10 mg total) by mouth daily. 180 tablet 3   Omega-3 Fatty Acids (FISH OIL PO) Take 700 mg by mouth daily.     pantoprazole  (PROTONIX ) 40 MG tablet TAKE 1 TABLET(40 MG) BY MOUTH TWICE DAILY BEFORE A MEAL 180 tablet 3   potassium chloride  SA (KLOR-CON  M) 20 MEQ tablet Take 2 tablets (40 mEq total) by mouth daily. 180 tablet 1   PREDNISOLON-MOXIFLOX-BROMFENAC OP Place 1 drop into both eyes daily as needed (irritation).     rosuvastatin  (CRESTOR ) 40 MG tablet Take 1 tablet (40 mg total) by mouth daily. 90 tablet 3   sucralfate  (CARAFATE ) 1 g tablet TAKE 1 TABLET(1 GRAM) BY MOUTH FOUR TIMES DAILY- WITH EACH MEAL AND AT BEDTIME 360 tablet 0   triamcinolone  cream (KENALOG ) 0.5 % Apply topically 2 (two) times daily. 30 g 3   vitamin B-12 (CYANOCOBALAMIN ) 1000 MCG tablet Take 1,000 mcg by mouth daily.     Current Facility-Administered Medications  Medication Dose Route Frequency Provider Last Rate Last Admin   diclofenac  Sodium (VOLTAREN ) 1 % topical gel 2 g  2 g Topical PRN         Review of Systems  Constitutional: Positive for unexpected weight change.  Respiratory: Respiratory negative.  GI: Positive for  abdominal pain and diarrhea.  Musculoskeletal: Musculoskeletal negative.  Skin: Skin negative.  Neurological: Neurological negative. Hematologic: Hematologic/lymphatic negative.  Psychiatric: Psychiatric negative.        Objective:  Objective   Vitals:   11/24/23 0838  BP: (!) 172/80  Pulse: (!) 54  Temp: 97.7 F (36.5 C)  SpO2: 97%  Weight: 156 lb (70.8 kg)  Height: 5' 6 (1.676 m)   Body mass index is 25.18 kg/m.  Physical Exam HENT:     Head: Normocephalic.     Nose: Nose normal.  Eyes:     Pupils: Pupils are equal, round, and reactive to light.  Neck:     Vascular: No carotid bruit.  Cardiovascular:     Rate and Rhythm: Normal rate.     Pulses:          Radial pulses are 2+ on the right side and 2+ on the left side.       Popliteal pulses are 2+ on the right side and 2+ on the left side.  Pulmonary:     Effort: Pulmonary effort is normal.  Abdominal:     General: Abdomen is flat.     Palpations: Abdomen is soft.  Musculoskeletal:     Cervical back: Normal range of motion and neck supple.     Right lower leg: No edema.     Left lower leg: No edema.  Skin:    General: Skin is warm.     Capillary Refill: Capillary refill takes less than 2 seconds.  Neurological:     General: No focal deficit present.     Mental Status: She is alert.  Psychiatric:        Mood and Affect: Mood normal.        Thought  Content: Thought content normal.        Judgment: Judgment normal.     Data: Duplex Findings:  +--------------------+--------+--------+------+----------------------------  +  Mesenteric         PSV cm/sEDV cm/sPlaque          Comments             +--------------------+--------+--------+------+----------------------------  +  Aorta at Celiac        83                                                +--------------------+--------+--------+------+----------------------------  +  Aorta Distal           80                        2.37 x 2.92  cm          +--------------------+--------+--------+------+----------------------------  +  Celiac Artery Origin  278                                                +--------------------+--------+--------+------+----------------------------  +  SMA Origin            328                 Arises off the celiac  artery  +--------------------+--------+--------+------+----------------------------  +  SMA Proximal          355                                                +--------------------+--------+--------+------+----------------------------  +  SMA Mid               190                                                +--------------------+--------+--------+------+----------------------------  +  SMA Distal            165                                                +--------------------+--------+--------+------+----------------------------  +  CHA                  115                                                +--------------------+--------+--------+------+----------------------------  +  Splenic              146                                                +--------------------+--------+--------+------+----------------------------  +  IMA                  520                                                +--------------------+--------+--------+------+----------------------------  +           Summary:  Largest Aortic Diameter: distal aortic ectasia, measuring 2.9 cm    Mesenteric:  70 to 99% stenosis in the celiac artery and superior mesenteric artery.  The  Inferior Mesenteric artery appears stenotic.      Assessment/Plan:     78 year old female was recently having abdominal pain thought secondary to chronic mesenteric ischemia.  Thankfully her pain is improving and she has actually gained a few pounds and does not appear to be related to chronic mesenteric ischemia at this time.  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  I will have her follow-up with repeat duplex in 1 year unless she has issues prior.  She demonstrates good understanding.     Penne Lonni Colorado MD [facility]

## 2023-11-26 ENCOUNTER — Ambulatory Visit: Admitting: Family Medicine

## 2023-11-26 ENCOUNTER — Encounter: Payer: Self-pay | Admitting: Family Medicine

## 2023-11-26 VITALS — BP 128/76 | HR 51 | Temp 98.5°F | Ht 66.0 in | Wt 158.6 lb

## 2023-11-26 DIAGNOSIS — R944 Abnormal results of kidney function studies: Secondary | ICD-10-CM | POA: Diagnosis not present

## 2023-11-26 DIAGNOSIS — I10 Essential (primary) hypertension: Secondary | ICD-10-CM

## 2023-11-26 DIAGNOSIS — E1169 Type 2 diabetes mellitus with other specified complication: Secondary | ICD-10-CM | POA: Diagnosis not present

## 2023-11-26 DIAGNOSIS — E782 Mixed hyperlipidemia: Secondary | ICD-10-CM

## 2023-11-26 LAB — POCT GLYCOSYLATED HEMOGLOBIN (HGB A1C): Hemoglobin A1C: 6.2 % — AB (ref 4.0–5.6)

## 2023-11-26 LAB — MICROALBUMIN / CREATININE URINE RATIO
Creatinine,U: 306 mg/dL
Microalb Creat Ratio: 27.9 mg/g (ref 0.0–30.0)
Microalb, Ur: 8.5 mg/dL — ABNORMAL HIGH (ref 0.0–1.9)

## 2023-11-26 NOTE — Patient Instructions (Signed)
 Your hemoglobin A1c was 6.2% this visit.  Do not forget to set up an eye exam to screen for diabetic retinopathy.

## 2023-11-26 NOTE — Progress Notes (Signed)
 Established Patient Office Visit   Subjective  Patient ID: Angela Pugh, female    DOB: 07-09-1945  Age: 78 y.o. MRN: [id number]  Chief Complaint  Patient presents with   Medical Management of Chronic Issues    Follow-up for lab results from different provider     Pt is a 78 yo female seen for f/u.  Patient concerned about renal function.  States was advised it was low during recent visit with another provider.  Thinks GFR was 48.07.  Patient drinking mostly water daily.  Recently stopped taking all the GI medications and notes improvement in her stomach discomfort.  Prior to stopping medications pt had no appetite, nausea, griping of stomach.  Was seen by GI without improvement in symptoms.    Patient Active Problem List   Diagnosis Date Noted   Diverticulosis 04/01/2023   Constipation 02/18/2023   Radiculopathy, lumbar region 12/08/2022   Spondylolisthesis, lumbar region 12/08/2022   Headache above the eye region 02/19/2021   Nasal congestion 02/19/2021   Emphysema lung (HCC) 10/04/2020   Abdominal pain, epigastric 03/18/2020   Body mass index (BMI) 26.0-26.9, adult 10/23/2019   Nonischemic cardiomyopathy (HCC) 04/12/2018   Chest pain 01/25/2018   Left bundle branch block 01/25/2018   Carotid artery disease (HCC) 01/25/2018   Seasonal allergies 10/18/2017   Arthritis 10/18/2017   Pre-diabetes 10/18/2017   Protein-calorie malnutrition, severe 05/05/2016   Dysphagia 05/04/2016   Displacement of cervical intervertebral disc without myelopathy 03/18/2016   Carpal tunnel syndrome 02/10/2016   Cervical spondylosis with radiculopathy 02/10/2016   Abdominal pain 02/01/2016   Acute diverticulitis 02/01/2016   HLD (hyperlipidemia) 02/01/2016   Diabetes mellitus without complication (HCC) 02/01/2016   Hypokalemia 02/01/2016   Hypertension    Cervical spondylosis without myelopathy 01/13/2016   Bilateral carpal tunnel syndrome 01/13/2016   Herniated lumbar intervertebral  disc 06/30/2013    Class: Acute   Low back pain 02/06/2013   Past Medical History:  Diagnosis Date   Arthritis    back, knees, shoulder, hands , injections in pelvis, for bone spurs (05/04/2016)   Chronic back pain    Diverticulosis    Dysrhythmia    LBBB   GERD (gastroesophageal reflux disease)    History of kidney stones    HLD (hyperlipidemia)    Hypertension    Migraine    hx (05/04/2016)   Peripheral neuropathy    Radiculopathy    Sinus complaint    Sinus headache    occasional (05/04/2016)   Type II diabetes mellitus (HCC)    diet controlled, no meds now, was on insulin  & po treatment at one time     Vertigo    Past Surgical History:  Procedure Laterality Date   ANTERIOR CERVICAL DECOMP/DISCECTOMY FUSION N/A 04/17/2016   Procedure: ANTERIOR CERVICAL DECOMPRESSION/DISCECTOMY FUSION CERVICAL THREE - CERVICAL FOUR, POSTERIOR CERVICAL DISCECTOMY CERVICAL SEVEN -THORASIC ONE LEFT;  Surgeon: Rockey Peru, MD;  Location: [facility] OR;  Service: Neurosurgery;  Laterality: N/A;  Anterior/Posterior   ANTERIOR FUSION CERVICAL SPINE  2000   Dr. Gaither; for bone spurs; put hardware in too   BACK SURGERY N/A 12/08/2022   COLONOSCOPY  06/10/2016   [facility]em   COLONOSCOPY  2023   LASIK Bilateral    LEFT HEART CATH AND CORONARY ANGIOGRAPHY N/A 03/17/2018   Procedure: LEFT HEART CATH AND CORONARY ANGIOGRAPHY;  Surgeon: Court Dorn PARAS, MD;  Location: [facility];  Service: Cardiovascular;  Laterality: N/A;   LUMBAR LAMINECTOMY Left 06/30/2013  Procedure: Left L3-4 Extraforaminal approach to excise far lateral herniated nucleus pulposus;  Surgeon: Lynwood FORBES Better, MD;  Location: [facility] OR;  Service: Orthopedics;  Laterality: Left;   TUBAL LIGATION     VAGINAL HYSTERECTOMY     Social History   Tobacco Use   Smoking status: Former    Current packs/day: 0.00    Average packs/day: 0.5 packs/day for 20.0 years (10.0 ttl pk-yrs)    Types: Cigarettes    Start date: 06/30/1962    Quit date:  06/30/1982    Years since quitting: 41.4   Smokeless tobacco: Never   Tobacco comments:    Patient only smokes in social setting when drinking. Admits former h/o heavy smoking years ago smoked 1.5 packs/day.    12/04/2021 Patient states she has not spoke for about 3 weeks    12/01/2022: pt will have a few drags if she is having a drink  Vaping Use   Vaping status: Never Used  Substance Use Topics   Alcohol use: Yes    Comment: special occasion   Drug use: No   Family History  Problem Relation Age of Onset   Uterine cancer Mother    Diabetes type II Sister    Diabetes Mellitus II Brother    Diverticulitis Daughter    Heart Problems Daughter    SIDS Son    Colon cancer Neg Hx    Pancreatic cancer Neg Hx    Esophageal cancer Neg Hx    Colon polyps Neg Hx    Rectal cancer Neg Hx    Stomach cancer Neg Hx    Allergies  Allergen Reactions   Symbicort  [Budesonide -Formoterol  Fumarate] Shortness Of Breath, Swelling and Other (See Comments)    Pharyngeal edema, hoarseness, tightness in chest, lips became dark.   Acetaminophen  Hives and Nausea Only   Azithromycin Hives and Nausea And Vomiting   Lipitor [Atorvastatin] Nausea Only    weakness   Pregabalin  Nausea And Vomiting    dizziness   Zyrtec [Cetirizine] Hives    ROS Negative unless stated above    Objective:     BP (!) 146/78 (BP Location: Left Arm, Patient Position: Sitting, Cuff Size: Normal)   Pulse (!) 51   Temp 98.5 F (36.9 C) (Oral)   Ht 5' 6 (1.676 m)   Wt 158 lb 9.6 oz (71.9 kg)   SpO2 98%   BMI 25.60 kg/m  BP Readings from Last 3 Encounters:  11/26/23 (!) 146/78  11/24/23 (!) 172/80  10/21/23 120/60   Wt Readings from Last 3 Encounters:  11/26/23 158 lb 9.6 oz (71.9 kg)  11/24/23 156 lb (70.8 kg)  10/21/23 156 lb 4 oz (70.9 kg)      Physical Exam Constitutional:      General: She is not in acute distress.    Appearance: Normal appearance.  HENT:     Head: Normocephalic and atraumatic.      Nose: Nose normal.     Mouth/Throat:     Mouth: Mucous membranes are moist.  Cardiovascular:     Rate and Rhythm: Normal rate and regular rhythm.     Heart sounds: Normal heart sounds. No murmur heard.    No gallop.  Pulmonary:     Effort: Pulmonary effort is normal. No respiratory distress.     Breath sounds: Normal breath sounds. No wheezing, rhonchi or rales.  Abdominal:     General: Bowel sounds are normal. There is no distension.     Palpations: Abdomen is soft.  Tenderness: There is no abdominal tenderness. There is no guarding.  Skin:    General: Skin is warm and dry.  Neurological:     Mental Status: She is alert and oriented to person, place, and time.        11/26/2023    1:30 PM 07/30/2023    3:55 PM 02/19/2023    1:59 PM  Depression screen PHQ 2/9  Decreased Interest 1 0 0  Down, Depressed, Hopeless 0 0 0  PHQ - 2 Score 1 0 0  Altered sleeping 2 1 0  Tired, decreased energy 2 1 0  Change in appetite 1 1 0  Feeling bad or failure about yourself  0 0 0  Trouble concentrating 0 0 0  Moving slowly or fidgety/restless 0 0 0  Suicidal thoughts 0 0 0  PHQ-9 Score 6 3 0  Difficult doing work/chores Not difficult at all Not difficult at all Not difficult at all      11/26/2023    1:31 PM 07/30/2023    3:55 PM 02/12/2023    8:48 AM  GAD 7 : Generalized Anxiety Score  Nervous, Anxious, on Edge 0 0 0  Control/stop worrying 0 0 0  Worry too much - different things 0 0 0  Trouble relaxing 0 1 0  Restless 0 0 0  Easily annoyed or irritable 0  0  Afraid - awful might happen 0 0 0  Total GAD 7 Score 0  0  Anxiety Difficulty  Not difficult at all Not difficult at all     Results for orders placed or performed in visit on 11/26/23  POC HgB A1c  Result Value Ref Range   Hemoglobin A1C 6.2 (A) 4.0 - 5.6 %   HbA1c POC (<> result, manual entry)     HbA1c, POC (prediabetic range)     HbA1c, POC (controlled diabetic range)        Assessment & Plan:   Type 2  diabetes mellitus with other specified complication, without long-term current use of insulin  (HCC) -     POCT glycosylated hemoglobin (Hb A1C) -     Microalbumin / creatinine urine ratio  Essential hypertension  Mixed hyperlipidemia  Decreased GFR -     Basic metabolic panel with GFR; Future  Diabetes diet controlled.  Hemoglobin A1c 6.2% this visit.  Continue lifestyle modifications.  Will obtain microalbumin creatinine ratio next visit.  Patient is set up eye exam.  Foot exam up-to-date.  Continue Crestor  40 mg daily and olmesartan  5 mg daily.  BP elevated.  Recheck.  Continue Norvasc  5 mg daily and olmesartan  5 mg daily.  Lifestyle modifications encouraged.  Recheck GFR.  Return if symptoms worsen or fail to improve.   Clotilda JONELLE Single, MD

## 2023-11-27 LAB — BASIC METABOLIC PANEL WITH GFR
BUN/Creatinine Ratio: 11 (calc) (ref 6–22)
BUN: 14 mg/dL (ref 7–25)
CO2: 26 mmol/L (ref 20–32)
Calcium: 9 mg/dL (ref 8.6–10.4)
Chloride: 106 mmol/L (ref 98–110)
Creat: 1.32 mg/dL — ABNORMAL HIGH (ref 0.60–1.00)
Glucose, Bld: 105 mg/dL — ABNORMAL HIGH (ref 65–99)
Potassium: 4.2 mmol/L (ref 3.5–5.3)
Sodium: 142 mmol/L (ref 135–146)
eGFR: 42 mL/min/1.73m2 — ABNORMAL LOW (ref >=60)

## 2023-12-01 ENCOUNTER — Ambulatory Visit (INDEPENDENT_AMBULATORY_CARE_PROVIDER_SITE_OTHER): Payer: Medicare Other | Admitting: Orthopedic Surgery

## 2023-12-01 ENCOUNTER — Other Ambulatory Visit (INDEPENDENT_AMBULATORY_CARE_PROVIDER_SITE_OTHER)

## 2023-12-01 DIAGNOSIS — M1712 Unilateral primary osteoarthritis, left knee: Secondary | ICD-10-CM | POA: Diagnosis not present

## 2023-12-01 DIAGNOSIS — Z981 Arthrodesis status: Secondary | ICD-10-CM

## 2023-12-01 NOTE — Progress Notes (Signed)
 Orthopedic Surgery Post-operative Office Visit   Procedure: L3-5 laminectomy and PSIF Date of Surgery: 12/08/2022 (~1 year post-op)   Assessment: Patient is a 78 y.o. who is still having waxing and waning back pain.  Reporting left knee pain as well.  No radiating leg pain.     Plan: -Operative plans complete -No spine specific precautions at this time -Will continue to use morphine  and medrol  dose paks for when she has flares of back pain -Patient interested in repeat left knee injection which was done today in the office -Return to office in 1 year, x-rays needed at next visit: AP/lateral/flex/ex lumbar   Left knee injection note: After discussing the risk, benefits, and alternatives of left knee intra-articular injection, patient elected to proceed.  The patient was in the seated position with the knee at 90 degrees.  The anterior lateral soft spot of the left knee was prepped with an alcohol based prep.  Ethyl chloride was used to anesthetize the skin.  A 20-gauge needle was used to inject 1 cc of Depo-Medrol , 1 cc of lidocaine , 1 cc of bupivacaine  under standard sterile technique.  Needle was withdrawn and Band-Aid was applied.  Patient tolerated the procedure well. ___________________________________________________________________________     Subjective: Patient has been doing similarly since she was last seen in the office.  She has moments where her back pain is worse and then it gets better.  She has no pain radiating into either lower extremity.  She has been having left knee pain.  She has previously gotten injections and found was helpful.  She is interested in repeating that again.  She feels the pain in her knee with weightbearing.   Objective:   General: no acute distress, appropriate affect Neurologic: alert, answering questions appropriately, following commands Respiratory: unlabored breathing on room air Skin: incision is well healed   MSK (spine):   -Strength exam                                                    Left                  Right   EHL                              5/5                  5/5 TA                                 5/5                  5/5 GSC                             5/5                  5/5 Knee extension            5/5                  5/5 Hip flexion                    5/5  5/5   -Sensory exam                           Sensation intact to light touch in L3-S1 nerve distributions of bilateral lower extremities     Imaging: X-rays of the lumbar spine taken 12/01/2023 were dependently reviewed interpreted, showing posterior instrumentation from L3-L5.  Laminectomy defect from L3-L5.  No lucency seen around the screws.  None of the screws are backed out.  No translation seen on the flexion/extension views.  No fracture or dislocation seen.    Patient name: Angela Pugh Patient MRN: [id number] Date of visit: 12/01/23

## 2023-12-03 ENCOUNTER — Ambulatory Visit: Payer: Self-pay | Admitting: Family Medicine

## 2023-12-14 ENCOUNTER — Encounter (HOSPITAL_COMMUNITY): Payer: Self-pay

## 2023-12-14 ENCOUNTER — Other Ambulatory Visit: Payer: Self-pay

## 2023-12-14 ENCOUNTER — Emergency Department (HOSPITAL_COMMUNITY)

## 2023-12-14 ENCOUNTER — Ambulatory Visit: Payer: Self-pay

## 2023-12-14 ENCOUNTER — Emergency Department (HOSPITAL_COMMUNITY)
Admission: EM | Admit: 2023-12-14 | Discharge: 2023-12-14 | Discharge status: Against Medical Advice | Source: Ambulatory Visit | Attending: Emergency Medicine | Admitting: Emergency Medicine

## 2023-12-14 DIAGNOSIS — R112 Nausea with vomiting, unspecified: Secondary | ICD-10-CM | POA: Insufficient documentation

## 2023-12-14 DIAGNOSIS — M79602 Pain in left arm: Secondary | ICD-10-CM | POA: Diagnosis not present

## 2023-12-14 DIAGNOSIS — R079 Chest pain, unspecified: Secondary | ICD-10-CM | POA: Diagnosis not present

## 2023-12-14 DIAGNOSIS — R0602 Shortness of breath: Secondary | ICD-10-CM | POA: Insufficient documentation

## 2023-12-14 DIAGNOSIS — R0789 Other chest pain: Secondary | ICD-10-CM | POA: Diagnosis not present

## 2023-12-14 DIAGNOSIS — Z5321 Procedure and treatment not carried out due to patient leaving prior to being seen by health care provider: Secondary | ICD-10-CM | POA: Insufficient documentation

## 2023-12-14 DIAGNOSIS — M542 Cervicalgia: Secondary | ICD-10-CM | POA: Insufficient documentation

## 2023-12-14 LAB — TROPONIN I (HIGH SENSITIVITY): Troponin I (High Sensitivity): 7 ng/L (ref <18)

## 2023-12-14 LAB — CBC
HCT: 41.7 % (ref 36.0–46.0)
Hemoglobin: 13.8 g/dL (ref 12.0–15.0)
MCH: 31.9 pg (ref 26.0–34.0)
MCHC: 33.1 g/dL (ref 30.0–36.0)
MCV: 96.3 fL (ref 80.0–100.0)
Platelets: 225 K/uL (ref 150–400)
RBC: 4.33 MIL/uL (ref 3.87–5.11)
RDW: 13.5 % (ref 11.5–15.5)
WBC: 6.6 K/uL (ref 4.0–10.5)
nRBC: 0 % (ref 0.0–0.2)

## 2023-12-14 LAB — COMPREHENSIVE METABOLIC PANEL WITH GFR
ALT: 12 U/L (ref 0–44)
AST: 20 U/L (ref 15–41)
Albumin: 3.6 g/dL (ref 3.5–5.0)
Alkaline Phosphatase: 63 U/L (ref 38–126)
Anion gap: 11 (ref 5–15)
BUN: 13 mg/dL (ref 8–23)
CO2: 24 mmol/L (ref 22–32)
Calcium: 9.1 mg/dL (ref 8.9–10.3)
Chloride: 104 mmol/L (ref 98–111)
Creatinine, Ser: 1.13 mg/dL — ABNORMAL HIGH (ref 0.44–1.00)
GFR, Estimated: 50 mL/min — ABNORMAL LOW (ref >60)
Glucose, Bld: 132 mg/dL — ABNORMAL HIGH (ref 70–99)
Potassium: 4.3 mmol/L (ref 3.5–5.1)
Sodium: 139 mmol/L (ref 135–145)
Total Bilirubin: 0.6 mg/dL (ref 0.0–1.2)
Total Protein: 6.9 g/dL (ref 6.5–8.1)

## 2023-12-14 NOTE — ED Triage Notes (Signed)
 Pt c/o cp, LA and neck pain since last Friday; endorses some sob and nausea; denies cardiac hx

## 2023-12-14 NOTE — ED Provider Triage Note (Cosign Needed)
 Emergency Medicine Provider Triage Evaluation Note  Angela Pugh , a 78 y.o. female  was evaluated in triage.  Pt complains neck, left arm, chest pain of increasing frequency. Patient states that this pain has been going on approximately 1 week, with the pain starting last Friday. Patient states that neck pain started first and then arm pain. Denies cardiac hx. Denies known fever, but does appreciate chills and shortness of breath. Also appreciates some nausea, vomiting, and abdominal pain but also states she has diverticulitis vs. Diverticulosis.  Review of Systems  Positive: Chest pain, left arm pain, neck pain, shortness of breath Negative: Fever  Physical Exam  BP (!) 157/65   Pulse 60   Temp 98.6 F (37 C) (Oral)   Resp 18   Ht 5' 6 (1.676 m)   Wt 70.8 kg   SpO2 100%   BMI 25.18 kg/m  Gen:   Awake, no distress   Resp:  Normal effort  MSK:   Moves extremities without difficulty  Other: Reproducible chest pain with palpation, patient very tender over left sternocleidomastoid region, left shoulder, left shoulder blade, however range of motion of left upper extremity normal  Medical Decision Making  Medically screening exam initiated at 5:22 PM.  Appropriate orders placed.  Dwayne Prescott Christians was informed that the remainder of the evaluation will be completed by another provider, this initial triage assessment does not replace that evaluation, and the importance of remaining in the ED until their evaluation is complete.  Orders: CBC, CMP, EKG, troponin, UA, CXR   Janetta Terrall FALCON, PA-C 12/14/23 1736

## 2023-12-14 NOTE — Telephone Encounter (Signed)
 FYI Only or Action Required?: FYI only for provider.  Patient was last seen in primary care on 11/26/2023 by Mercer Clotilda SAUNDERS, MD.  Called Nurse Triage reporting Arm Pain.  Symptoms began several days ago.  Interventions attempted: Nothing.  Symptoms are: gradually worsening.  Triage Disposition: Go to ED Now (Notify PCP)  Patient/caregiver understands and will follow disposition?: Yes, will follow disposition  Copied from CRM [id number]. Topic: Clinical - Pink Word Triage >> Dec 14, 2023  3:19 PM Pinkey ORN wrote: Reason for Triage: Slight Discomfort >> Dec 14, 2023  3:21 PM [facility] ORN wrote: Patient called in stating she's been experiencing some pain in her left arm, around the elbow area. Patient states it started roughly around Friday of last week, it isn't constant the pain comes and goes. Patient call back number is [phone]  Reason for Disposition  [1] Age > 40 AND [2] no obvious cause AND [3] pain even when not moving the arm  (Exception: Pain is clearly made worse by moving arm or bending neck.)  Answer Assessment - Initial Assessment Questions 1. ONSET: When did the pain start?     About 4 days ago 2. LOCATION: Where is the pain located?     L elbow area 3. PAIN: How bad is the pain? (Scale 0-10; or none, mild, moderate, severe)     5 4. WORK OR EXERCISE: Has there been any recent work or exercise that involved this part of the body?     denies 5. CAUSE: What do you think is causing the arm pain?     denies 6. OTHER SYMPTOMS: Do you have any other symptoms? (e.g., neck pain, swelling, rash, fever, numbness, weakness)     Denies redness, bruising, swelling. Intermittent pain. Denies N/V, sweating. Denies injury. States she did suffer some neck pain prior to arm pain starting.  Protocols used: Arm Pain-A-AH

## 2023-12-15 NOTE — Progress Notes (Unsigned)
 Cardiology Office Note:    Date:  12/15/2023   ID:  Angela Pugh, Angela Pugh 1946/04/03, MRN [id number]  PCP:  Mercer Clotilda SAUNDERS, MD  Cardiologist:  Dorn Lesches, MD Cardiology APP:  Madie Jon Garre, PA  Advanced Heart Failure:  Toribio Fuel, MD { Click to update primary MD,subspecialty MD or APP then REFRESH:1}    Referring MD: Mercer Clotilda SAUNDERS, MD   Chief Complaint: ED follow-up of chest pain  History of Present Illness:    Angela Pugh is a 78 y.o. female with a history of normal coronaries on cardiac catheterization in 02/2018, non-ischemic cardiomyopathy/ chronic HFrEF with EF as low as 35-40% in 2019 with subsequent normalization, mild carotid artery disease, chronic mesenteric ischemia, emphysema, hypertension, hyperlipidemia, type 2 diabetes mellitus, GERD, vertigo, chronic back pain, and peripheral neuropathy who is followed by Dr. Lesches and presents today for ED follow-up of chest pain.   Patient was first seen by Dr. Lesches in 05/2017 for further evaluation of chest pain that she reported having for years and new LBBB. Coronary CTA and Echo were ordered for further evaluation. She never had the Echo performed and did not have the coronary CTA performed until 02/2018. This showed a coronary calcium  score of 136 (79th percentile for age and sex) with moderate disease of the ostial to proximal LAD and mild disease of the RCA and LCX. FFR was positive in the proximal LAD. This led to a cardiac catheterization later that month that showed normal coronaries but LVEF of 35-45%. She was started on GDMT and [facility]. Monitor in 05/2018 showed 3 short runs of SVT (longest run 7 beats), one 4 beat run of NSVT, and occasional PVCs but no significant arrhythmias. Cardiac MRI in 06/2018 showed normal LV size with EF of 47% and septa-lateral dyssynchrony and normal RV size and function. There was no myocardial LGE so no evidence of prior MI, infiltrative disease,  or myocarditis. She was felt to possible have LBBB cardiomyopathy.   Repeat monitor in 09/2020 for further evaluation of bradycardia showed sinus rhythm / sinus bradycardia with rates as low as the 30s, 7 runs of SVT (longest run 17 beats), and rare PACs/ PVCs. EF normalized to 60-65% on Echo in 10/2020.  At office visit in 05/2023, she reported atypical left sided chest pain that she described as an electrical sensation for the past 2-3 months as well as pain radiating down both her arms that also felt like an electrical shock. She also reported dyspnea with minimal exertion. Dyspnea was felt to likely be secondary to COPD and ongoing URI. Chest/ bilateral arm pain was felt to be due to nerve issues. Repeat Echo was ordered and LVEF of 50-55% with moderate LVH and grade 2 diastolic dysfunction, normal RV function, severe left atrial enlargement, mild AI, and mild dilatation of the ascending aorta and aortic arch measuring 40mm and 39mm respectively. She was last seen by [name], PA-C, in 07/2023 at which time she was doing well with no chest pain or shortness of breath.  She also has chronic abdominal pain secondary to mesenteric ischemia for which she follows with Vascular Surgery. Mesenteric ultrasound on 11/24/2023 showed 70-99% stenosis of the celiac artery and superior mesenteric artery. Inferior mesenteric artery appeared stenotic as well.   She was seen on the ED on 12/14/2023 for further evaluation of chest, neck, and arm pain. EKG showed sinus bradycardia, rate 57 bpm, with known LBBB. High-sensitivity troponin was negative.CBC  and CMET were unremarkable. She left the ED before second troponin could be draw and before she could formally be seen by ED provider.  Patient presents today for follow-up. ***  Chest Pain Patient has a history of chest pain over the years. LHC in 02/2018 showed normal coronaries. She now reports ***  Non-Ischemic Cardiomyopathy Chronic HFrEF with Recovered EF EF as  low as 35-45% on cardiac catheterization in 02/2018. Coronaries were normal. Cardiac MRI 06/2020 showed normal LV size with EF of 47% and septa-lateral dyssynchrony and normal RV size and function. There was no myocardial LGE so no evidence of prior MI, infiltrative disease, or myocarditis. She was felt to possible have LBBB cardiomyopathy. EF subsequently normalized. Last Echo in 05/2023 showed LVEF of 50-55% with moderate LVH and grade 2 diastolic dysfunction, normal RV function, severe left atrial enlargement, mild AI, and mild dilatation of the ascending aorta and aortic arch measuring 40mm and 39mm respectively. - Euvolemic on exam. *** - Continue Olmesartan  10mg  daily.  - No beta-blocker given baseline bradycardia.   Carotid Artery Disease  Carotid dopplers in 11/2022 showed mild stenosis (1-39%) of bilateral ICAs as well as disturbed flow of right subclavian artery. *** - Continue aspirin  and high-intensity statin  Mesenteric Ischemia Mesenteric ultrasound on 11/24/2023 showed 70-99% stenosis of the celiac artery and superior mesenteric artery. Inferior mesenteric artery appeared stenotic as well.  - Continue aspirin  and high-intensity statin. - Followed by Vascular Surgery.  Hypertension BP *** - Current medications: Amlodipine  10mg  daily and Olmesartan  10mg  daily.   Hyperlipidemia Lipid panel in 01/2023: Total Cholesterol 191, Triglycerides 136, HDL 66.7, LDL 97. LDL goal <70 given CAD. - Continue Crestor  40mg  daily. Compliance ***  Type 2 Diabetes Mellitus Hemoglobin A1c 6.2% on 11/26/2023. - Not on any medications.  - Management per PCP.   CKD Stage IIIa Baseline creatinine around 1.1.    EKGs/Labs/Other Studies Reviewed:    The following studies were reviewed:  Coronary CTA 03/02/2018: Impressions: 1. Coronary calcium  score of 136. This was 79th percentile for age and sex matched control. 2. Normal coronary origin with right dominance. 3. Mild CAD in the RCA and LCX  arteries. Moderate disease in the ostial to proximal LAD.  FFR Summary: 1. Significant stenosis of the proximal LAD (FFR 0.78)  _______________  Left Cardiac Catheterization 03/17/2018: There is mild to moderate left ventricular systolic dysfunction. LV end diastolic pressure is mildly elevated. The left ventricular ejection fraction is 35-45% by visual estimate.  Impression: Ms. Clonch has essentially normal coronary arteries and mild to moderate LV dysfunction. Her EF is in the 40 to 45% range. I believe her chest pain is noncardiac and a CT FFR was falsely positive. A minx closure device was successfully deployed achieving hemostasis. The patient left the lab in stable condition. She will be hydrated for the next several hours and discharged home. She will be followed closely as an outpatient.   Diagnostic Dominance: Right  _______________  Monitor 05/2020: 1) Sinus Rhythm with first degree AV Block. min HR of 40 bpm, max HR of 129 bpm, and avg HR of 62 bpm. 2) 3 Supraventricular Tachycardia runs occurred, the longest lasting 7 beats with an avg rate of 110 bpm.  3) One four-beat run NSVT 4) Occasional PVCs _______________  Cardiac MRI 06/23/2018: Impression: 1.  Normal LV size with EF 47%, septal-lateral dyssynchrony. 2.  Normal RV size and systolic function. 3. No myocardial LGE, so no evidence for prior MI, infiltrative disease, or myocarditis.  Possible LBBB cardiomyopathy. _______________  Monitor 10/14/2020 to 10/28/2020: Patient had a min HR of 35 bpm, max HR of 169 bpm, and avg HR of 56 bpm. Predominant underlying rhythm was Sinus Rhythm. First Degree AV Block was present. Bundle Branch Block/IVCD was present. 7 Supraventricular Tachycardia runs occurred, the run with  the fastest interval lasting 17 beats with a max rate of 169 bpm (avg 125 bpm); the run with the fastest interval was also the longest. Isolated SVEs were rare (<1.0%), SVE Couplets were rare (<1.0%), and SVE  Triplets were rare (<1.0%). Isolated VEs were  rare (<1.0%), VE Couplets were rare (<1.0%), and no VE Triplets were present.   1: Sinus rhythm/sinus bradycardia (rates as low as the 30s) 2: Occasional PVCs and PACs 3: Short runs of SVT _______________  Carotid Dopplers 12/07/2022: Summary: - Right Carotid: Velocities in the right ICA are consistent with a 1-39% stenosis.  - Left Carotid: Velocities in the left ICA are consistent with a 1-39% stenosis.  - Vertebrals: Bilateral vertebral arteries demonstrate antegrade flow.  - Subclavians: Right subclavian artery flow was disturbed. Normal flow hemodynamics were seen in the left subclavian artery.  _______________  Echocardiogram 06/17/2023: Impressions: 1. Left ventricular ejection fraction, by estimation, is 50 to 55%. Left  ventricular ejection fraction by 3D volume is 53 %. The left ventricle has  low normal function. The left ventricle has no regional wall motion  abnormalities. There is moderate left   ventricular hypertrophy. Left ventricular diastolic parameters are  consistent with Grade II diastolic dysfunction (pseudonormalization).   2. Right ventricular systolic function is normal. The right ventricular  size is not well visualized.   3. Left atrial size was severely dilated.   4. The mitral valve is normal in structure. Trivial mitral valve  regurgitation. No evidence of mitral stenosis.   5. The aortic valve is tricuspid. There is mild thickening of the aortic  valve. Aortic valve regurgitation is mild. No aortic stenosis is present.   6. Aortic dilatation noted. There is mild dilatation of the ascending  aorta, measuring 40 mm. There is mild dilatation of the aortic arch,  measuring 39 mm.   7. The inferior vena cava is normal in size with greater than 50%  respiratory variability, suggesting right atrial pressure of 3 mmHg.  _______________  Mesenteric Ultrasound 11/24/2023: Largest Aortic Diameter: distal aortic  ectasia, measuring 2.9 cm    Mesenteric:  70 to 99% stenosis in the celiac artery and superior mesenteric artery.  The inferior Mesenteric artery appears stenotic.    EKG:  EKG not ordered today.   Recent Labs: 02/12/2023: TSH 1.88 04/01/2023: B Natriuretic Peptide 124.7 12/14/2023: ALT 12; BUN 13; Creatinine, Ser 1.13; Hemoglobin 13.8; Platelets 225; Potassium 4.3; Sodium 139  Recent Lipid Panel    Component Value Date/Time   CHOL 191 02/12/2023 1003   CHOL 205 (H) 06/23/2022 1051   TRIG 136.0 02/12/2023 1003   HDL 66.70 02/12/2023 1003   HDL 56 06/23/2022 1051   CHOLHDL 3 02/12/2023 1003   VLDL 27.2 02/12/2023 1003   LDLCALC 97 02/12/2023 1003   LDLCALC 119 (H) 06/23/2022 1051    Physical Exam:    Vital Signs: There were no vitals taken for this visit.    Wt Readings from Last 3 Encounters:  12/14/23 156 lb (70.8 kg)  11/26/23 158 lb 9.6 oz (71.9 kg)  11/24/23 156 lb (70.8 kg)     General: 78 y.o. female in no acute distress. HEENT: Normocephalic and  atraumatic. Sclera clear.  Neck: Supple. No carotid bruits. No JVD. Heart: *** RRR. Distinct S1 and S2. No murmurs, gallops, or rubs.  Lungs: No increased work of breathing. Clear to ausculation bilaterally. No wheezes, rhonchi, or rales.  Abdomen: Soft, non-distended, and non-tender to palpation.  Extremities: No lower extremity edema.  Radial and distal pedal pulses 2+ and equal bilaterally. Skin: Warm and dry. Neuro: No focal deficits. Psych: Normal affect. Responds appropriately.   Assessment:    No diagnosis found.  Plan:     Disposition: Follow up in ***   Signed, Aline FORBES Door, PA-C  12/15/2023 10:44 AM    [facility]

## 2023-12-15 NOTE — Telephone Encounter (Signed)
 Seen in the ED 7/29

## 2023-12-16 ENCOUNTER — Encounter: Payer: Self-pay | Admitting: Student

## 2023-12-16 ENCOUNTER — Ambulatory Visit: Attending: Student | Admitting: Cardiology

## 2023-12-16 VITALS — BP 130/80 | HR 57 | Ht 67.0 in | Wt 153.4 lb

## 2023-12-16 DIAGNOSIS — I10 Essential (primary) hypertension: Secondary | ICD-10-CM

## 2023-12-16 DIAGNOSIS — J439 Emphysema, unspecified: Secondary | ICD-10-CM

## 2023-12-16 DIAGNOSIS — E785 Hyperlipidemia, unspecified: Secondary | ICD-10-CM | POA: Diagnosis not present

## 2023-12-16 DIAGNOSIS — I428 Other cardiomyopathies: Secondary | ICD-10-CM

## 2023-12-16 DIAGNOSIS — R079 Chest pain, unspecified: Secondary | ICD-10-CM | POA: Diagnosis not present

## 2023-12-16 DIAGNOSIS — Z720 Tobacco use: Secondary | ICD-10-CM | POA: Diagnosis not present

## 2023-12-16 NOTE — Patient Instructions (Signed)
 Medication Instructions:  Your physician recommends that you continue on your current medications as directed. Please refer to the Current Medication list given to you today.  *If you need a refill on your cardiac medications before your next appointment, please call your pharmacy*  Lab Work: Your physician recommends that you return for lab work in: the next week for FASTING lipids  If you have labs (blood work) drawn today and your tests are completely normal, you will receive your results only by: MyChart Message (if you have MyChart) OR A paper copy in the mail If you have any lab test that is abnormal or we need to change your treatment, we will call you to review the results.   Follow-Up: At [facility], you and your health needs are our priority.  As part of our continuing mission to provide you with exceptional heart care, our providers are all part of one team.  This team includes your primary Cardiologist (physician) and Advanced Practice Providers or APPs (Physician Assistants and Nurse Practitioners) who all work together to provide you with the care you need, when you need it.  Your next appointment:   12 month(s)  Provider:   Dorn Lesches, MD    We recommend signing up for the patient portal called MyChart.  Sign up information is provided on this After Visit Summary.  MyChart is used to connect with patients for Virtual Visits (Telemedicine).  Patients are able to view lab/test results, encounter notes, upcoming appointments, etc.  Non-urgent messages can be sent to your provider as well.   To learn more about what you can do with MyChart, go to [email or web address].

## 2023-12-16 NOTE — Progress Notes (Signed)
 Cardiology Office Note:  .   Date:  12/16/2023  ID:  Angela Pugh, DOB 22-Oct-1945, MRN [id number] PCP: Mercer Clotilda SAUNDERS, MD  [facility] Providers Cardiologist:  Dorn Lesches, MD Cardiology APP:  Madie Jon Garre, PA  Advanced Heart Failure:  Toribio Fuel, MD {  History of Present Illness: .   Angela Pugh is a 78 y.o. female with history of  normal coronaries on cardiac catheterization in 02/2018, non-ischemic cardiomyopathy with recovered EF, LBBB, mild carotid artery disease, chronic mesenteric ischemia, emphysema, hypertension, hyperlipidemia, type 2 diabetes mellitus, GERD, vertigo, chronic back pain, and peripheral neuropathy     NICM Seen 05/2017 for chest pain and new LBBB.   02/2018 had abnormal CTA which prompted cardiac catheterization that demonstrated normal coronaries.  Visually EF 35 to 45%. Heart monitor 05/2018 with no significant arrhythmias. Cardiac MRI 06/2018 with EF 47% in septal lateral dyssynchrony.  No LGE.  No evidence of infiltrative disease.  Possible LBBB cardiomyopathy. EF normalized 10/2020 60 to 65% EF 50 to 55% 12/2021 EF 50 to 55%, moderate LVH, severely dilated LA. 05/2023  Chest pain 05/2023 reported atypical chest pain described as electrical sensation with DOE.  Echo normal.  Symptoms felt related to COPD with URI and nerve related pain.  Carotid artery disease Vascular Dopplers 11/2022 with 1 to 39% stenosis bilaterally.  Social history  Long-term smoker 1/2 pack/day however only smokes after drinking which is only occasional Very active, loves being in the yard and exercising.  Can walk over an hour.     Patient with history of nonischemic cardiomyopathy with recovered EF.  Last seen in office 07/2023 been doing well without any complaints of chest pain or shortness of breath.  She was seen in the emergency room 11/2023 complaining of chest, neck, arm pain.  First troponin was negative.  She left before second troponin  could be drawn.    Today patient is here for follow-up after her ER visit.  She reports that she has had chronic left-sided neuropathy for years with 3 prior back surgeries and one of them being cervical.  Reports numbness and tingling that will go from her shoulder and radiate down just before her wrist.  Not accompanied with any exertional symptoms and often just occur at random for 15 to 20 minutes.  Has no other accompanying symptoms.  She was not overly concerned about the symptoms but had called her PCP.  Had reported that the pain was in her neck and was told to go to the emergency room.  Otherwise she has no other acute complaints today.  ROS: Denies: Chest pain, shortness of breath, orthopnea, peripheral edema, palpitations, decreased exercise intolerance, fatigue, lightheadedness.   Studies Reviewed: .         Risk Assessment/Calculations:             Physical Exam:   VS:  BP 130/80 (BP Location: Right Arm, Patient Position: Sitting, Cuff Size: Normal)   Pulse (!) 57   Ht 5' 7 (1.702 m)   Wt 153 lb 6.4 oz (69.6 kg)   SpO2 97%   BMI 24.03 kg/m    Wt Readings from Last 3 Encounters:  12/16/23 153 lb 6.4 oz (69.6 kg)  12/14/23 156 lb (70.8 kg)  11/26/23 158 lb 9.6 oz (71.9 kg)    GEN: Well nourished, well developed in no acute distress NECK: No JVD; No carotid bruits CARDIAC: RRR, 2/6 murmur LLSB RESPIRATORY:  Clear to auscultation without rales, wheezing  or rhonchi  ABDOMEN: Soft, non-tender, non-distended EXTREMITIES:  No edema; No deformity   ASSESSMENT AND PLAN: .    Neuropathy  Reporting numbness and tingling in her left arm.  Complaints do not sound cardiac at all.  With history of 3 back surgeries, musculoskeletal versus neuropathic related etiology seems more likely.  She exhibits very good functional status with no anginal equivalents.  She has reassuring history of normal cardiac catheterization in 2019.  Recommend PCP follow-up with this.  Nonischemic  cardiomyopathy with recovered EF - cMRI 06/2018 with EF 47% in septal lateral dyssynchrony.  No LGE.  No evidence of infiltrative disease.  Possible LBBB cardiomyopathy. - 10/2020 EF normalized  - 05/2023 EF 50 to 55%, moderate LVH, G2 DD, severely dilated LA, mild AR.  Seems to be more of a remote issue, she is very functional with NYHA class I symptoms.  Euvolemic and very well compensated. GDMT: Continue with olmesartan  5 mg.  Of note she is also on potassium supplementation 20 mEq twice daily. No beta-blocker with baseline bradycardia.  Murmur Reports that she has had this since she was a kid.  Echocardiogram 05/2023 with preserved biventricular function with moderate LVH.  Severely dilated LA.  There was only mild AR.  Paired to dilatation 40 mm.  Can follow on annual echocardiograms.  Carotid artery disease Mild disease based off of ultrasound 11/2022.  No need for routine monitoring in the absence of any symptoms.  Mesenteric ischemia Mesenteric ultrasound on 11/24/2023 showed 70-99% stenosis of the celiac artery and superior mesenteric artery. Inferior mesenteric artery appeared stenotic as well.  Followed by vascular. Has follow-up for this  Hypertension Good control 130/80 today.   Continue olmesartan  5 mg and amlodipine  5 mg.  Hyperlipidemia LDL 97.  Goal less than 70 with history of nonobstructive CAD. Repeating lipid panel whenever she is fasted.  Continue rosuvastatin  40 mg.  Type 2 diabetes A1c 6.2% 11/2023  COPD Continue to encourage smoking cessation.  She reports she only is smoking infrequently now after drinking which is also very infrequent.  Dispo: 1 year follow-up with Dr. Court   Signed, Thom LITTIE Sluder, PA-C

## 2023-12-20 ENCOUNTER — Telehealth: Payer: Self-pay | Admitting: Nurse Practitioner

## 2023-12-20 NOTE — Telephone Encounter (Signed)
 H. Pylori stool antigen test 11/04/2023 was negative.

## 2023-12-27 ENCOUNTER — Other Ambulatory Visit: Payer: Self-pay | Admitting: Family Medicine

## 2024-01-05 ENCOUNTER — Other Ambulatory Visit: Payer: Self-pay | Admitting: Family Medicine

## 2024-01-05 DIAGNOSIS — L409 Psoriasis, unspecified: Secondary | ICD-10-CM

## 2024-01-13 ENCOUNTER — Encounter: Payer: Self-pay | Admitting: Nurse Practitioner

## 2024-01-21 ENCOUNTER — Other Ambulatory Visit: Payer: Self-pay | Admitting: Nurse Practitioner

## 2024-01-26 DIAGNOSIS — H00024 Hordeolum internum left upper eyelid: Secondary | ICD-10-CM | POA: Diagnosis not present

## 2024-01-26 DIAGNOSIS — H00021 Hordeolum internum right upper eyelid: Secondary | ICD-10-CM | POA: Diagnosis not present

## 2024-02-09 ENCOUNTER — Other Ambulatory Visit

## 2024-02-09 DIAGNOSIS — Z1159 Encounter for screening for other viral diseases: Secondary | ICD-10-CM | POA: Diagnosis not present

## 2024-02-10 LAB — HEPATITIS C ANTIBODY: Hepatitis C Ab: NONREACTIVE

## 2024-02-17 ENCOUNTER — Ambulatory Visit (INDEPENDENT_AMBULATORY_CARE_PROVIDER_SITE_OTHER): Admitting: Orthopedic Surgery

## 2024-02-17 DIAGNOSIS — M1712 Unilateral primary osteoarthritis, left knee: Secondary | ICD-10-CM

## 2024-02-17 NOTE — Progress Notes (Signed)
 Orthopedic Injection Note  Patient with left knee pain.  Has received previous injections and found them helpful.  The last about 3 months.  She is interested in repeat injection.  This was done today in the office.  See procedure note below.   Left knee intra-articular injection note: After discussing the risk, benefits, alternatives of left knee intra-articular injection, patient elected to proceed.  Patient was in the seated position with the knee at 90 degrees.  The anterior lateral soft spot over the knee was prepped with an alcohol-based prep.  Ethyl chloride was used to anesthetize the skin.  A 20-gauge needle was used to inject 1 cc of Depo-Medrol , 1 cc of bupivacaine , 1 cc of lidocaine  into the intra-articular space under standard sterile technique.  Needle was withdrawn and Band-Aid was applied.  Patient tolerated the procedure well.   Angela DELENA Ada, MD Orthopedic Surgeon

## 2024-02-18 ENCOUNTER — Other Ambulatory Visit: Payer: Self-pay | Admitting: Ophthalmology

## 2024-02-18 DIAGNOSIS — Z8669 Personal history of other diseases of the nervous system and sense organs: Secondary | ICD-10-CM

## 2024-02-18 DIAGNOSIS — H00021 Hordeolum internum right upper eyelid: Secondary | ICD-10-CM | POA: Diagnosis not present

## 2024-02-18 DIAGNOSIS — H40013 Open angle with borderline findings, low risk, bilateral: Secondary | ICD-10-CM | POA: Diagnosis not present

## 2024-02-18 DIAGNOSIS — E119 Type 2 diabetes mellitus without complications: Secondary | ICD-10-CM | POA: Diagnosis not present

## 2024-02-18 DIAGNOSIS — H34233 Retinal artery branch occlusion, bilateral: Secondary | ICD-10-CM | POA: Diagnosis not present

## 2024-02-23 ENCOUNTER — Encounter: Payer: Self-pay | Admitting: Family Medicine

## 2024-02-23 ENCOUNTER — Inpatient Hospital Stay: Admission: RE | Admit: 2024-02-23 | Discharge: 2024-02-23 | Attending: Ophthalmology | Admitting: Ophthalmology

## 2024-02-23 ENCOUNTER — Encounter: Payer: Self-pay | Admitting: Podiatry

## 2024-02-23 ENCOUNTER — Ambulatory Visit: Admitting: Family Medicine

## 2024-02-23 ENCOUNTER — Ambulatory Visit: Admitting: Podiatry

## 2024-02-23 VITALS — BP 164/82 | HR 59 | Temp 98.4°F | Ht 67.0 in | Wt 155.4 lb

## 2024-02-23 DIAGNOSIS — E1151 Type 2 diabetes mellitus with diabetic peripheral angiopathy without gangrene: Secondary | ICD-10-CM

## 2024-02-23 DIAGNOSIS — H34233 Retinal artery branch occlusion, bilateral: Secondary | ICD-10-CM | POA: Diagnosis not present

## 2024-02-23 DIAGNOSIS — M79674 Pain in right toe(s): Secondary | ICD-10-CM | POA: Diagnosis not present

## 2024-02-23 DIAGNOSIS — Q828 Other specified congenital malformations of skin: Secondary | ICD-10-CM

## 2024-02-23 DIAGNOSIS — I6521 Occlusion and stenosis of right carotid artery: Secondary | ICD-10-CM | POA: Diagnosis not present

## 2024-02-23 DIAGNOSIS — B351 Tinea unguium: Secondary | ICD-10-CM

## 2024-02-23 DIAGNOSIS — I10 Essential (primary) hypertension: Secondary | ICD-10-CM

## 2024-02-23 DIAGNOSIS — I6522 Occlusion and stenosis of left carotid artery: Secondary | ICD-10-CM

## 2024-02-23 DIAGNOSIS — M79675 Pain in left toe(s): Secondary | ICD-10-CM | POA: Diagnosis not present

## 2024-02-23 DIAGNOSIS — Z8669 Personal history of other diseases of the nervous system and sense organs: Secondary | ICD-10-CM

## 2024-02-23 DIAGNOSIS — E782 Mixed hyperlipidemia: Secondary | ICD-10-CM | POA: Diagnosis not present

## 2024-02-23 DIAGNOSIS — I6523 Occlusion and stenosis of bilateral carotid arteries: Secondary | ICD-10-CM | POA: Diagnosis not present

## 2024-02-23 NOTE — Progress Notes (Signed)
 Established Patient Office Visit   Subjective  Patient ID: Angela Pugh, female    DOB: 10-Aug-1945  Age: 78 y.o. MRN: [id number]  Chief Complaint  Patient presents with   Acute Visit  Pt accompanied by her daughter.  Pt is a 78 yo female seen for acute issue.  Pt advised at recent eye exam to follow-up with PCP due to retinal vein occlusion seen on DFE.  Ophthalmologist ordered bilateral carotid ultrasound was done today.  Patient is unaware of the results.  Prior to ophthalmology appointment patient noted episode of left sided neck discomfort.  Later she experienced black spot in vision.  Now resolved.  Currently taking Crestor  40 mg daily.  In the past Lipitor caused nausea and weakness.  Cholesterol last checked 1 year ago at CPE.     Of note patient thinks her BP is elevated due to forgetting to take blood pressure medication last night.    Patient Active Problem List   Diagnosis Date Noted   Diverticulosis 04/01/2023   Constipation 02/18/2023   Radiculopathy, lumbar region 12/08/2022   Spondylolisthesis, lumbar region 12/08/2022   Headache above the eye region 02/19/2021   Nasal congestion 02/19/2021   Emphysema lung (HCC) 10/04/2020   Abdominal pain, epigastric 03/18/2020   Body mass index (BMI) 26.0-26.9, adult 10/23/2019   Nonischemic cardiomyopathy (HCC) 04/12/2018   Chest pain 01/25/2018   Left bundle branch block 01/25/2018   Carotid artery disease 01/25/2018   Seasonal allergies 10/18/2017   Arthritis 10/18/2017   Pre-diabetes 10/18/2017   Protein-calorie malnutrition, severe 05/05/2016   Dysphagia 05/04/2016   Displacement of cervical intervertebral disc without myelopathy 03/18/2016   Carpal tunnel syndrome 02/10/2016   Cervical spondylosis with radiculopathy 02/10/2016   Abdominal pain 02/01/2016   Acute diverticulitis 02/01/2016   HLD (hyperlipidemia) 02/01/2016   Diabetes mellitus without complication (HCC) 02/01/2016   Hypokalemia 02/01/2016    Hypertension    Cervical spondylosis without myelopathy 01/13/2016   Bilateral carpal tunnel syndrome 01/13/2016   Herniated lumbar intervertebral disc 06/30/2013    Class: Acute   Low back pain 02/06/2013   Past Medical History:  Diagnosis Date   Arthritis    back, knees, shoulder, hands , injections in pelvis, for bone spurs (05/04/2016)   Chronic back pain    Diverticulosis    Dysrhythmia    LBBB   GERD (gastroesophageal reflux disease)    History of kidney stones    HLD (hyperlipidemia)    Hypertension    Migraine    hx (05/04/2016)   Peripheral neuropathy    Radiculopathy    Sinus complaint    Sinus headache    occasional (05/04/2016)   Type II diabetes mellitus (HCC)    diet controlled, no meds now, was on insulin  & po treatment at one time     Vertigo    Past Surgical History:  Procedure Laterality Date   ANTERIOR CERVICAL DECOMP/DISCECTOMY FUSION N/A 04/17/2016   Procedure: ANTERIOR CERVICAL DECOMPRESSION/DISCECTOMY FUSION CERVICAL THREE - CERVICAL FOUR, POSTERIOR CERVICAL DISCECTOMY CERVICAL SEVEN -THORASIC ONE LEFT;  Surgeon: Rockey Peru, MD;  Location: [facility] OR;  Service: Neurosurgery;  Laterality: N/A;  Anterior/Posterior   ANTERIOR FUSION CERVICAL SPINE  2000   Dr. Gaither; for bone spurs; put hardware in too   BACK SURGERY N/A 12/08/2022   COLONOSCOPY  06/10/2016   Gabem   COLONOSCOPY  2023   LASIK Bilateral    LEFT HEART CATH AND CORONARY ANGIOGRAPHY N/A 03/17/2018   Procedure: LEFT HEART CATH  AND CORONARY ANGIOGRAPHY;  Surgeon: Court Dorn PARAS, MD;  Location: [facility] INVASIVE CV LAB;  Service: Cardiovascular;  Laterality: N/A;   LUMBAR LAMINECTOMY Left 06/30/2013   Procedure: Left L3-4 Extraforaminal approach to excise far lateral herniated nucleus pulposus;  Surgeon: Lynwood FORBES Better, MD;  Location: [facility] OR;  Service: Orthopedics;  Laterality: Left;   TUBAL LIGATION     VAGINAL HYSTERECTOMY     Social History   Tobacco Use   Smoking status: Former    Current  packs/day: 0.00    Average packs/day: 0.5 packs/day for 20.0 years (10.0 ttl pk-yrs)    Types: Cigarettes    Start date: 06/30/1962    Quit date: 06/30/1982    Years since quitting: 41.6   Smokeless tobacco: Never   Tobacco comments:    Patient only smokes in social setting when drinking. Admits former h/o heavy smoking years ago smoked 1.5 packs/day.    12/04/2021 Patient states she has not spoke for about 3 weeks    12/01/2022: pt will have a few drags if she is having a drink  Vaping Use   Vaping status: Never Used  Substance Use Topics   Alcohol use: Yes    Comment: special occasion   Drug use: No   Family History  Problem Relation Age of Onset   Uterine cancer Mother    Diabetes type II Sister    Diabetes Mellitus II Brother    Diverticulitis Daughter    Heart Problems Daughter    SIDS Son    Colon cancer Neg Hx    Pancreatic cancer Neg Hx    Esophageal cancer Neg Hx    Colon polyps Neg Hx    Rectal cancer Neg Hx    Stomach cancer Neg Hx    Allergies  Allergen Reactions   Symbicort  [Budesonide -Formoterol  Fumarate] Shortness Of Breath, Swelling and Other (See Comments)    Pharyngeal edema, hoarseness, tightness in chest, lips became dark.   Acetaminophen  Hives and Nausea Only   Azithromycin Hives and Nausea And Vomiting   Lipitor [Atorvastatin] Nausea Only    weakness   Macrolides And Ketolides Hives, Nausea And Vomiting and Other (See Comments)   Pregabalin  Nausea And Vomiting    dizziness   Statins Nausea Only and Other (See Comments)    weakness   Zyrtec [Cetirizine] Hives    ROS Negative unless stated above    Objective:     There were no vitals taken for this visit. BP Readings from Last 3 Encounters:  12/16/23 130/80  12/14/23 (!) 157/65  11/26/23 128/76   Wt Readings from Last 3 Encounters:  12/16/23 153 lb 6.4 oz (69.6 kg)  12/14/23 156 lb (70.8 kg)  11/26/23 158 lb 9.6 oz (71.9 kg)      Physical Exam Constitutional:      General: She is  not in acute distress.    Appearance: Normal appearance.  HENT:     Head: Normocephalic and atraumatic.     Nose: Nose normal.     Mouth/Throat:     Mouth: Mucous membranes are moist.  Neck:     Vascular: No carotid bruit.  Cardiovascular:     Rate and Rhythm: Normal rate and regular rhythm.     Heart sounds: Normal heart sounds. No murmur heard.    No gallop.  Pulmonary:     Effort: Pulmonary effort is normal. No respiratory distress.     Breath sounds: Normal breath sounds. No wheezing, rhonchi or rales.  Skin:  General: Skin is warm and dry.  Neurological:     Mental Status: She is alert and oriented to person, place, and time.        02/23/2024    4:39 PM 11/26/2023    1:30 PM 07/30/2023    3:55 PM  Depression screen PHQ 2/9  Decreased Interest 0 1 0  Down, Depressed, Hopeless 0 0 0  PHQ - 2 Score 0 1 0  Altered sleeping 1 2 1   Tired, decreased energy 2 2 1   Change in appetite 2 1 1   Feeling bad or failure about yourself  0 0 0  Trouble concentrating 0 0 0  Moving slowly or fidgety/restless 0 0 0  Suicidal thoughts 0 0 0  PHQ-9 Score 5 6 3   Difficult doing work/chores Not difficult at all Not difficult at all Not difficult at all      02/23/2024    4:39 PM 11/26/2023    1:31 PM 07/30/2023    3:55 PM 02/12/2023    8:48 AM  GAD 7 : Generalized Anxiety Score  Nervous, Anxious, on Edge 0 0 0 0  Control/stop worrying 0 0 0 0  Worry too much - different things 0 0 0 0  Trouble relaxing 0 0 1 0  Restless 0 0 0 0  Easily annoyed or irritable 0 0  0  Afraid - awful might happen 0 0 0 0  Total GAD 7 Score 0 0  0  Anxiety Difficulty Not difficult at all  Not difficult at all Not difficult at all     No results found for any visits on 02/23/24.    Assessment & Plan:   Atherosclerosis of right carotid artery  Stenosis of left carotid artery  Retinal artery occlusion, branch, bilateral  Essential hypertension  Mixed hyperlipidemia  Retinal artery occlusion  OU on DFE with ophthalmology, [facility] 02/18/2024.  Carotid ultrasound done today: RCA with less than 50% stenosis, torturous artery.  LCA less than 50% stenosis 2/2 plaque formation.  Findings similar to those of CT head and neck from 06/20/2023.  Per last cardiology OFV note no need for routine monitoring unless patient symptomatic.  Patient currently on statin, Crestor  40 mg daily.  Prior history of intolerance to Lipitor as cause of nausea and weakness.  Total cholesterol 191, HDL 66, LDL 97, triglycerides 136 on 02/12/2023.  Will repeat lipid panel at upcoming CPE.  As currently on highest dose of Crestor  and history of prior statin intolerance discussed Repatha.  Will have patient follow-up with cardiology to weigh in on this.  Patient expresses understanding and would like to decrease number of pills taken daily.  Given strict precautions.  Patient advised to take BP medication.  Can try setting an alarm to help remember evening doses of medications.  Recheck BP in clinic.  Continue current medications.  Return if symptoms worsen or fail to improve.   Clotilda JONELLE Single, MD

## 2024-02-27 NOTE — Progress Notes (Unsigned)
 Subjective:  Patient ID: Angela Pugh, female    DOB: [birth year],  MRN: [id number]  Angela Pugh presents to clinic today for at risk foot care. Pt has h/o NIDDM with PAD and painful porokeratotic lesion(s) b/l lower extremities and painful mycotic toenails that limit ambulation. Painful toenails interfere with ambulation. Aggravating factors include wearing enclosed shoe gear. Pain is relieved with periodic professional debridement. Painful porokeratotic lesions are aggravated when weightbearing with and without shoegear. Pain is relieved with periodic professional debridement.  Chief Complaint  Patient presents with   Diabetes    [facility] Diet control diabetes Toenail trim.PALMA with PCP.   New problem(s): None.   PCP is Mercer Clotilda SAUNDERS, MD.  Allergies  Allergen Reactions   Symbicort  [Budesonide -Formoterol  Fumarate] Shortness Of Breath, Swelling and Other (See Comments)    Pharyngeal edema, hoarseness, tightness in chest, lips became dark.   Acetaminophen  Hives and Nausea Only   Azithromycin Hives and Nausea And Vomiting   Lipitor [Atorvastatin] Nausea Only    weakness   Macrolides And Ketolides Hives, Nausea And Vomiting and Other (See Comments)   Pregabalin  Nausea And Vomiting    dizziness   Statins Nausea Only and Other (See Comments)    weakness   Zyrtec [Cetirizine] Hives    Review of Systems: Negative except as noted in the HPI.  Objective:  There were no vitals filed for this visit. Angela Pugh is a pleasant 78 y.o. female WD, WN in NAD. AAO x 3.  Vascular Examination: CFT <3 seconds b/l. DP pulse faintly palpable right foot. DP pulse nonpalpable left foot b/l. PT pulses faintly palpable b/l. Digital hair absent. Skin temperature gradient warm to warm b/l. No pain with calf compression. No ischemia or gangrene. No cyanosis or clubbing noted b/l. No edema noted b/l LE.   Neurological Examination: Sensation grossly intact b/l with 10 gram monofilament.  Vibratory sensation intact b/l. Pt has subjective symptoms of neuropathy.  Dermatological Examination: Pedal skin warm and supple b/l.   No open wounds. No interdigital macerations.  Toenails 1-5 b/l thick, discolored, elongated with subungual debris and pain on dorsal palpation.    Porokeratotic lesion(s) plantar heel pad of right foot, posterolateral aspect of right heel, plantar IPJ of left great toe, and submet head 2 left foot. No erythema, no edema, no drainage, no fluctuance.  Musculoskeletal Examination: Muscle strength 5/5 to all lower extremity muscle groups bilaterally. Hammertoe(s) bilateral 2nd toes.. No pain, crepitus or joint limitation noted with ROM b/l LE.  Patient ambulates independently without assistive aids.  Radiographs: None  Last A1c:      Latest Ref Rng & Units 11/26/2023    1:28 PM  Hemoglobin A1C  Hemoglobin-A1c 4.0 - 5.6 % 6.2    Assessment/Plan: 1. Pain due to onychomycosis of toenails of both feet   2. Porokeratosis   3. Type II diabetes mellitus with peripheral circulatory disorder (HCC)   {[id number]::-Patient/POA to call should there be question/concern in the interim.}   Return in about 9 weeks (around 04/26/2024).  Delon LITTIE Merlin, DPM      [place] LOCATION: [address], [place] [address]  Office [phone]   [facility] LOCATION: [address] [place], [place] [address] Office [phone]

## 2024-02-29 ENCOUNTER — Encounter: Payer: Self-pay | Admitting: Cardiovascular Disease

## 2024-02-29 ENCOUNTER — Ambulatory Visit: Attending: Cardiovascular Disease | Admitting: Cardiovascular Disease

## 2024-02-29 VITALS — BP 110/60 | HR 60 | Ht 67.0 in

## 2024-02-29 DIAGNOSIS — I428 Other cardiomyopathies: Secondary | ICD-10-CM

## 2024-02-29 DIAGNOSIS — I10 Essential (primary) hypertension: Secondary | ICD-10-CM

## 2024-02-29 DIAGNOSIS — I447 Left bundle-branch block, unspecified: Secondary | ICD-10-CM | POA: Diagnosis not present

## 2024-02-29 DIAGNOSIS — I6523 Occlusion and stenosis of bilateral carotid arteries: Secondary | ICD-10-CM

## 2024-02-29 DIAGNOSIS — E782 Mixed hyperlipidemia: Secondary | ICD-10-CM

## 2024-02-29 NOTE — Assessment & Plan Note (Signed)
 Chronic

## 2024-02-29 NOTE — Assessment & Plan Note (Signed)
 History of hyperlipidemia on high-dose statin therapy with lipid profile performed 02/12/2023 revealing total cholesterol 191, LDL 97 and HDL 66.

## 2024-02-29 NOTE — Patient Instructions (Signed)
 Medication Instructions:  Your physician recommends that you continue on your current medications as directed. Please refer to the Current Medication list given to you today.  *If you need a refill on your cardiac medications before your next appointment, please call your pharmacy*   Testing/Procedures: Your physician has requested that you have a carotid duplex. This test is an ultrasound of the carotid arteries in your neck. It looks at blood flow through these arteries that supply the brain with blood. Allow one hour for this exam. There are no restrictions or special instructions. This will take place at [address] 4th floor **To do October 2026**  Please note: We ask at that you not bring children with you during ultrasound (echo/ vascular) testing. Due to room size and safety concerns, children are not allowed in the ultrasound rooms during exams. Our front office staff cannot provide observation of children in our lobby area while testing is being conducted. An adult accompanying a patient to their appointment will only be allowed in the ultrasound room at the discretion of the ultrasound technician under special circumstances. We apologize for any inconvenience.   Follow-Up: At [facility], you and your health needs are our priority.  As part of our continuing mission to provide you with exceptional heart care, our providers are all part of one team.  This team includes your primary Cardiologist (physician) and Advanced Practice Providers or APPs (Physician Assistants and Nurse Practitioners) who all work together to provide you with the care you need, when you need it.  Your next appointment:   12 month(s)  Provider:   Dorn Lesches, MD    We recommend signing up for the patient portal called MyChart.  Sign up information is provided on this After Visit Summary.  MyChart is used to connect with patients for Virtual Visits (Telemedicine).  Patients are able to view  lab/test results, encounter notes, upcoming appointments, etc.  Non-urgent messages can be sent to your provider as well.   To learn more about what you can do with MyChart, go to [email or web address].

## 2024-02-29 NOTE — Progress Notes (Signed)
 02/29/2024 Dwayne Prescott Christians   1946/02/09  [id number]  Primary Physician Mercer Clotilda SAUNDERS, MD Primary Cardiologist: Dorn JINNY Lesches MD [facility] [place], [place]  HPI:  Angela Pugh is a 78 y.o.  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  She was referred by Dr. Mercer for cardiovascular evaluation because of newly recognized left bundle branch block and chest pain.  I last saw her in the office 03/06/2022.  She is accompanied by her daughter Angela Pugh today.  She does have a history of hypertension and diet-controlled diabetes.  She is never had a heart attack or stroke.  She has had recent carotid Dopplers performed in May revealing moderate bilateral ICA stenosis.  She had a newly recognized left bundle branch block as well.  She is had chest pain for several years which has increased in frequency and severity now occurring on a daily basis.  Is no relation to food or activity.   A CT FFR performed 03/04/2018 showed a physiologically significant lesion in the proximal LAD with an FFR of 0.78.  Based on this, we decided to proceed with outpatient diagnostic radial cardiac catheterization.   This was performed 03/17/2018 revealing normal coronary arteries and moderate LV dysfunction with an EF of 40%.   I did refer her to Dr. Cherrie who did a cardiac MRI that showed no evidence of myocarditis and an event monitor that was normal as well.  She has a nonischemic cardiomyopathy with an EF in the 35 to 40% range.  She was on low-dose Benicar  amlodipine .  She gets episodic episodes of breath and atypical chest pain that improves with Gas-X.   Since I saw her in the office 2 years ago she continues to do well.  She gets occasional noncardiac chest pain.  Her most recent 2D echo performed 06/17/2023 revealed an EF of 50 to 55% with grade 2 diastolic dysfunction and no significant valvular abnormalities.  She  did have carotid Dopplers performed by her PCP.  02/23/2024 that showed mild bilateral ICA stenosis.  Dr. Sheree  follows her for mesenteric ischemia.   Current Meds  Medication Sig   ABRYSVO 120 MCG/0.5ML injection    amLODipine  (NORVASC ) 5 MG tablet TAKE 1 TABLET(5 MG) BY MOUTH DAILY   aspirin  81 MG EC tablet Take 81 mg by mouth daily. Swallow whole.   Aspirin -Acetaminophen -Caffeine (GOODY HEADACHE PO) Take 1 packet by mouth daily as needed (headaches).   Blood Glucose Monitoring Suppl DEVI 1 each by Does not apply route in the morning, at noon, and at bedtime. May substitute to any manufacturer covered by patient's insurance.   Calcium  Carb-Cholecalciferol  (CALCIUM  500 + D PO) Take 1 tablet by mouth daily.   clobetasol  ointment (TEMOVATE ) 0.05 % APPLY TOPICALLY TO THE AFFECTED AREA TWICE DAILY   cyclobenzaprine  (FLEXERIL ) 10 MG tablet Take 1 tablet (10 mg total) by mouth 2 (two) times daily as needed for muscle spasms.   dicyclomine  (BENTYL ) 10 MG capsule Take 1 capsule (10 mg total) by mouth 4 (four) times daily as needed for spasms (for abdominal pain).   famotidine  (PEPCID ) 20 MG tablet TAKE 1 TABLET(20 MG) BY MOUTH AT BEDTIME   ferrous sulfate  325 (65 FE) MG EC tablet Take 325 mg by mouth daily.   Glucose Blood (BLOOD GLUCOSE TEST STRIPS) STRP 1 each by In Vitro route in the morning, at noon, and at bedtime. May substitute to  any manufacturer covered by [employer].   guaiFENesin (MUCINEX) 600 MG 12 hr tablet Take by mouth 2 (two) times daily.   Lancets Misc. MISC 1 each by Does not apply route in the morning, at noon, and at bedtime. May substitute to any manufacturer covered by patient's insurance.   loratadine  (CLARITIN ) 10 MG tablet TAKE 1 TABLET(10 MG) BY MOUTH DAILY   meclizine  (ANTIVERT ) 25 MG tablet Take 1 tablet (25 mg total) by mouth 3 (three) times daily as needed for dizziness.   morphine  (MSIR) 15 MG tablet Take 7.5 mg by mouth as needed.   Multiple Vitamins-Minerals  (CENTRUM ADULTS PO) Take 1 tablet by mouth daily.    NON FORMULARY Diltiazem 2%/Lidocaine5% compound Use 3 x rectally daily for 2 months to heal anal fissure   olmesartan  (BENICAR ) 5 MG tablet Take 2 tablets (10 mg total) by mouth daily.   Omega-3 Fatty Acids (FISH OIL PO) Take 700 mg by mouth daily.   pantoprazole  (PROTONIX ) 40 MG tablet TAKE 1 TABLET(40 MG) BY MOUTH TWICE DAILY BEFORE A MEAL   potassium chloride  SA (KLOR-CON  M) 20 MEQ tablet TAKE 2 TABLETS(40 MEQ) BY MOUTH DAILY   PREDNISOLON-MOXIFLOX-BROMFENAC OP Place 1 drop into both eyes daily as needed (irritation).   rosuvastatin  (CRESTOR ) 40 MG tablet Take 1 tablet (40 mg total) by mouth daily.   triamcinolone  cream (KENALOG ) 0.5 % Apply topically 2 (two) times daily.   vitamin B-12 (CYANOCOBALAMIN ) 1000 MCG tablet Take 1,000 mcg by mouth daily.   Current Facility-Administered Medications for the 02/29/24 encounter (Office Visit) with Court Dorn PARAS, MD  Medication   diclofenac  Sodium (VOLTAREN ) 1 % topical gel 2 g     Allergies  Allergen Reactions   Symbicort  [Budesonide -Formoterol  Fumarate] Shortness Of Breath, Swelling and Other (See Comments)    Pharyngeal edema, hoarseness, tightness in chest, lips became dark.   Acetaminophen  Hives and Nausea Only   Azithromycin Hives and Nausea And Vomiting   Lipitor [Atorvastatin] Nausea Only    weakness   Macrolides And Ketolides Hives, Nausea And Vomiting and Other (See Comments)   Pregabalin  Nausea And Vomiting    dizziness   Statins Nausea Only and Other (See Comments)    weakness   Zyrtec [Cetirizine] Hives    Social History   Socioeconomic History   Marital status: Widowed    Spouse name: Not on file   Number of children: 6   Years of education: Not on file   Highest education level: Not on file  Occupational History   Occupation: retired  Tobacco Use   Smoking status: Former    Current packs/day: 0.00    Average packs/day: 0.5 packs/day for 20.0 years (10.0 ttl  pk-yrs)    Types: Cigarettes    Start date: 06/30/1962    Quit date: 06/30/1982    Years since quitting: 41.6   Smokeless tobacco: Never   Tobacco comments:    Patient only smokes in social setting when drinking. Admits former h/o heavy smoking years ago smoked 1.5 packs/day.    12/04/2021 Patient states she has not spoke for about 3 weeks    12/01/2022: pt will have a few drags if she is having a drink  Vaping Use   Vaping status: Never Used  Substance and Sexual Activity   Alcohol use: Yes    Comment: special occasion   Drug use: No   Sexual activity: Not Currently  Other Topics Concern   Not on file  Social History Narrative  Not on file   Social Drivers of Health   Financial Resource Strain: Low Risk  (02/19/2023)   Overall Financial Resource Strain (CARDIA)    Difficulty of Paying Living Expenses: Not hard at all  Food Insecurity: No Food Insecurity (02/19/2023)   Hunger Vital Sign    Worried About Running Out of Food in the Last Year: Never true    Ran Out of Food in the Last Year: Never true  Transportation Needs: No Transportation Needs (02/19/2023)   PRAPARE - Administrator, Civil Service (Medical): No    Lack of Transportation (Non-Medical): No  Physical Activity: Sufficiently Active (02/19/2023)   Exercise Vital Sign    Days of Exercise per Week: 7 days    Minutes of Exercise per Session: 60 min  Stress: No Stress Concern Present (02/19/2023)   [employer] of Occupational Health - Occupational Stress Questionnaire    Feeling of Stress : Not at all  Social Connections: Moderately Integrated (02/19/2023)   Social Connection and Isolation Panel    Frequency of Communication with Friends and Family: More than three times a week    Frequency of Social Gatherings with Friends and Family: More than three times a week    Attends Religious Services: More than 4 times per year    Active Member of [employer] or Organizations: Yes    Attends Banker  Meetings: More than 4 times per year    Marital Status: Widowed  Intimate Partner Violence: Not At Risk (02/19/2023)   Humiliation, Afraid, Rape, and Kick questionnaire    Fear of Current or Ex-Partner: No    Emotionally Abused: No    Physically Abused: No    Sexually Abused: No     Review of Systems: General: negative for chills, fever, night sweats or weight changes.  Cardiovascular: negative for chest pain, dyspnea on exertion, edema, orthopnea, palpitations, paroxysmal nocturnal dyspnea or shortness of breath Dermatological: negative for rash Respiratory: negative for cough or wheezing Urologic: negative for hematuria Abdominal: negative for nausea, vomiting, diarrhea, bright red blood per rectum, melena, or hematemesis Neurologic: negative for visual changes, syncope, or dizziness All other systems reviewed and are otherwise negative except as noted above.    Blood pressure 110/60, pulse 60, height 5' 7 (1.702 m).  General appearance: alert and no distress Neck: no adenopathy, no carotid bruit, no JVD, supple, symmetrical, trachea midline, and thyroid  not enlarged, symmetric, no tenderness/mass/nodules Lungs: clear to auscultation bilaterally Heart: regular rate and rhythm, S1, S2 normal, no murmur, click, rub or gallop Extremities: extremities normal, atraumatic, no cyanosis or edema Pulses: 2+ and symmetric Skin: Skin color, texture, turgor normal. No rashes or lesions Neurologic: Grossly normal  EKG not performed today      ASSESSMENT AND PLAN:   Hypertension History of essential hypertension with blood pressure measured today at 110/60.  She is on amlodipine  and olmesartan .  HLD (hyperlipidemia) History of hyperlipidemia on high-dose statin therapy with lipid profile performed 02/12/2023 revealing total cholesterol 191, LDL 97 and HDL 66.  Left bundle branch block Chronic  Carotid artery disease Recent carotid Dopplers performed 02/23/2024 showed mild bilateral  ICA stenosis.  This will be repeated on an annual basis.  Nonischemic cardiomyopathy (HCC) History of nonischemic cardiomyopathy with an EF in the 40% range in the past improved to normal on GDMT.  She is asymptomatic.  I did eventually perform coronary angiography on her 03/17/2018 revealing normal coronaries.     Dorn DOROTHA Lesches MD FACP,FACC,FAHA,  FSCAI 02/29/2024 12:01 PM

## 2024-02-29 NOTE — Assessment & Plan Note (Signed)
 History of essential hypertension with blood pressure measured today at 110/60.  She is on amlodipine  and olmesartan .

## 2024-02-29 NOTE — Assessment & Plan Note (Signed)
 History of nonischemic cardiomyopathy with an EF in the 40% range in the past improved to normal on GDMT.  She is asymptomatic.  I did eventually perform coronary angiography on her 03/17/2018 revealing normal coronaries.

## 2024-02-29 NOTE — Assessment & Plan Note (Signed)
 Recent carotid Dopplers performed 02/23/2024 showed mild bilateral ICA stenosis.  This will be repeated on an annual basis.

## 2024-03-06 ENCOUNTER — Encounter: Payer: Self-pay | Admitting: Family Medicine

## 2024-03-06 ENCOUNTER — Ambulatory Visit: Admitting: Family Medicine

## 2024-03-06 VITALS — BP 136/68 | HR 52 | Temp 98.6°F | Ht 67.0 in | Wt 156.6 lb

## 2024-03-06 DIAGNOSIS — R630 Anorexia: Secondary | ICD-10-CM | POA: Diagnosis not present

## 2024-03-06 DIAGNOSIS — R110 Nausea: Secondary | ICD-10-CM | POA: Diagnosis not present

## 2024-03-06 DIAGNOSIS — R202 Paresthesia of skin: Secondary | ICD-10-CM

## 2024-03-06 DIAGNOSIS — L853 Xerosis cutis: Secondary | ICD-10-CM | POA: Diagnosis not present

## 2024-03-06 DIAGNOSIS — Z0000 Encounter for general adult medical examination without abnormal findings: Secondary | ICD-10-CM | POA: Diagnosis not present

## 2024-03-06 DIAGNOSIS — L299 Pruritus, unspecified: Secondary | ICD-10-CM | POA: Diagnosis not present

## 2024-03-06 LAB — FOLATE: Folate: >22.9 ng/mL (ref >5.9)

## 2024-03-06 MED ORDER — TRIAMCINOLONE ACETONIDE 0.5 % EX CREA
TOPICAL_CREAM | Freq: Two times a day (BID) | CUTANEOUS | 4 refills | Status: AC
Start: 2024-03-06 — End: ?

## 2024-03-06 NOTE — Progress Notes (Signed)
 Established Patient Office Visit   Subjective  Patient ID: Angela Pugh, female    DOB: 06/14/1945  Age: 78 y.o. MRN: [id number]  Chief Complaint  Patient presents with   Annual Exam    Pt is a 78 yo female seen for CPE.  Pt states she is doing well.  Has some aches and pains in knees from arthritis, but wearing copper bracelets helps.  Still having issues with her appetite.   At times can eat without issues, other times starts eating then feels nauseous and has to stop.  Was seen by GI, but still having issues.  Stopped bentyl  and Carafate  as they caused severe abd cramping.  Waking up 2-3 x per night to urinate.  Not drinking caffeine late in the day.  Has 2 cups of coffee in am.  Pt requesting refill on triamcinolone  cream.  States having occasional pruritus of fingertips.  No peeling or rash noted.  Symptoms stopped with use of triamcinolone  sparingly.  Patient also notes paresthesias in fingers and feet.    Patient Active Problem List   Diagnosis Date Noted   Diverticulosis 04/01/2023   Constipation 02/18/2023   Radiculopathy, lumbar region 12/08/2022   Spondylolisthesis, lumbar region 12/08/2022   Headache above the eye region 02/19/2021   Nasal congestion 02/19/2021   Emphysema lung (HCC) 10/04/2020   Abdominal pain, epigastric 03/18/2020   Body mass index (BMI) 26.0-26.9, adult 10/23/2019   Nonischemic cardiomyopathy (HCC) 04/12/2018   Chest pain 01/25/2018   Left bundle branch block 01/25/2018   Carotid artery disease 01/25/2018   Seasonal allergies 10/18/2017   Arthritis 10/18/2017   Pre-diabetes 10/18/2017   Protein-calorie malnutrition, severe 05/05/2016   Dysphagia 05/04/2016   Displacement of cervical intervertebral disc without myelopathy 03/18/2016   Carpal tunnel syndrome 02/10/2016   Cervical spondylosis with radiculopathy 02/10/2016   Abdominal pain 02/01/2016   Acute diverticulitis 02/01/2016   HLD (hyperlipidemia) 02/01/2016   Diabetes mellitus  without complication (HCC) 02/01/2016   Hypokalemia 02/01/2016   Hypertension    Cervical spondylosis without myelopathy 01/13/2016   Bilateral carpal tunnel syndrome 01/13/2016   Herniated lumbar intervertebral disc 06/30/2013    Class: Acute   Low back pain 02/06/2013   Past Medical History:  Diagnosis Date   Arthritis    back, knees, shoulder, hands , injections in pelvis, for bone spurs (05/04/2016)   Chronic back pain    Diverticulosis    Dysrhythmia    LBBB   GERD (gastroesophageal reflux disease)    History of kidney stones    HLD (hyperlipidemia)    Hypertension    Migraine    hx (05/04/2016)   Peripheral neuropathy    Radiculopathy    Sinus complaint    Sinus headache    occasional (05/04/2016)   Type II diabetes mellitus (HCC)    diet controlled, no meds now, was on insulin  & po treatment at one time     Vertigo    Past Surgical History:  Procedure Laterality Date   ANTERIOR CERVICAL DECOMP/DISCECTOMY FUSION N/A 04/17/2016   Procedure: ANTERIOR CERVICAL DECOMPRESSION/DISCECTOMY FUSION CERVICAL THREE - CERVICAL FOUR, POSTERIOR CERVICAL DISCECTOMY CERVICAL SEVEN -THORASIC ONE LEFT;  Surgeon: Rockey Peru, MD;  Location: [facility] OR;  Service: Neurosurgery;  Laterality: N/A;  Anterior/Posterior   ANTERIOR FUSION CERVICAL SPINE  2000   Dr. Gaither; for bone spurs; put hardware in too   BACK SURGERY N/A 12/08/2022   COLONOSCOPY  06/10/2016   Gabem   COLONOSCOPY  2023  LASIK Bilateral    LEFT HEART CATH AND CORONARY ANGIOGRAPHY N/A 03/17/2018   Procedure: LEFT HEART CATH AND CORONARY ANGIOGRAPHY;  Surgeon: Court Dorn PARAS, MD;  Location: [facility] INVASIVE CV LAB;  Service: Cardiovascular;  Laterality: N/A;   LUMBAR LAMINECTOMY Left 06/30/2013   Procedure: Left L3-4 Extraforaminal approach to excise far lateral herniated nucleus pulposus;  Surgeon: Lynwood FORBES Better, MD;  Location: [facility] OR;  Service: Orthopedics;  Laterality: Left;   TUBAL LIGATION     VAGINAL HYSTERECTOMY      Social History   Tobacco Use   Smoking status: Former    Current packs/day: 0.00    Average packs/day: 0.5 packs/day for 20.0 years (10.0 ttl pk-yrs)    Types: Cigarettes    Start date: 06/30/1962    Quit date: 06/30/1982    Years since quitting: 41.7   Smokeless tobacco: Never   Tobacco comments:    Patient only smokes in social setting when drinking. Admits former h/o heavy smoking years ago smoked 1.5 packs/day.    12/04/2021 Patient states she has not spoke for about 3 weeks    12/01/2022: pt will have a few drags if she is having a drink  Vaping Use   Vaping status: Never Used  Substance Use Topics   Alcohol use: Yes    Comment: special occasion   Drug use: No   Family History  Problem Relation Age of Onset   Uterine cancer Mother    Diabetes type II Sister    Diabetes Mellitus II Brother    Diverticulitis Daughter    Heart Problems Daughter    SIDS Son    Colon cancer Neg Hx    Pancreatic cancer Neg Hx    Esophageal cancer Neg Hx    Colon polyps Neg Hx    Rectal cancer Neg Hx    Stomach cancer Neg Hx    Allergies  Allergen Reactions   Symbicort  [Budesonide -Formoterol  Fumarate] Shortness Of Breath, Swelling and Other (See Comments)    Pharyngeal edema, hoarseness, tightness in chest, lips became dark.   Acetaminophen  Hives and Nausea Only   Azithromycin Hives and Nausea And Vomiting   Lipitor [Atorvastatin] Nausea Only    weakness   Macrolides And Ketolides Hives, Nausea And Vomiting and Other (See Comments)   Pregabalin  Nausea And Vomiting    dizziness   Statins Nausea Only and Other (See Comments)    weakness   Zyrtec [Cetirizine] Hives    ROS Negative unless stated above    Objective:     BP 136/68 (BP Location: Left Arm, Patient Position: Sitting, Cuff Size: Normal)   Pulse (!) 52   Temp 98.6 F (37 C) (Oral)   Ht 5' 7 (1.702 m)   Wt 156 lb 9.6 oz (71 kg)   SpO2 100%   BMI 24.53 kg/m  BP Readings from Last 3 Encounters:  03/06/24 136/68   02/29/24 110/60  02/23/24 (!) 164/82   Wt Readings from Last 3 Encounters:  03/06/24 156 lb 9.6 oz (71 kg)  02/23/24 155 lb 6.4 oz (70.5 kg)  12/16/23 153 lb 6.4 oz (69.6 kg)      Physical Exam Constitutional:      Appearance: Normal appearance.  HENT:     Head: Normocephalic and atraumatic.     Right Ear: Tympanic membrane, ear canal and external ear normal.     Left Ear: Tympanic membrane, ear canal and external ear normal.     Nose: Nose normal.  Mouth/Throat:     Mouth: Mucous membranes are moist.     Pharynx: No oropharyngeal exudate or posterior oropharyngeal erythema.  Eyes:     General: No scleral icterus.    Extraocular Movements: Extraocular movements intact.     Conjunctiva/sclera: Conjunctivae normal.     Pupils: Pupils are equal, round, and reactive to light.  Neck:     Thyroid : No thyromegaly.     Vascular: No carotid bruit.  Cardiovascular:     Rate and Rhythm: Normal rate and regular rhythm.     Pulses: Normal pulses.     Heart sounds: Normal heart sounds. No murmur heard.    No friction rub.  Pulmonary:     Effort: Pulmonary effort is normal.     Breath sounds: Normal breath sounds. No wheezing, rhonchi or rales.  Abdominal:     General: Bowel sounds are normal.     Palpations: Abdomen is soft.     Tenderness: There is no abdominal tenderness.  Musculoskeletal:        General: No deformity. Normal range of motion.  Lymphadenopathy:     Cervical: No cervical adenopathy.  Skin:    General: Skin is warm and dry.     Findings: No lesion.     Comments: Cystic lesions of skin on face.  Neurological:     General: No focal deficit present.     Mental Status: She is alert and oriented to person, place, and time.  Psychiatric:        Mood and Affect: Mood normal.        Thought Content: Thought content normal.        03/06/2024    8:58 AM 02/23/2024    4:39 PM 11/26/2023    1:30 PM  Depression screen PHQ 2/9  Decreased Interest 0 0 1  Down,  Depressed, Hopeless 0 0 0  PHQ - 2 Score 0 0 1  Altered sleeping 1 1 2   Tired, decreased energy 1 2 2   Change in appetite 1 2 1   Feeling bad or failure about yourself  0 0 0  Trouble concentrating 0 0 0  Moving slowly or fidgety/restless 0 0 0  Suicidal thoughts 0 0 0  PHQ-9 Score 3 5 6   Difficult doing work/chores Not difficult at all Not difficult at all Not difficult at all      03/06/2024    8:58 AM 02/23/2024    4:39 PM 11/26/2023    1:31 PM 07/30/2023    3:55 PM  GAD 7 : Generalized Anxiety Score  Nervous, Anxious, on Edge 0 0 0 0  Control/stop worrying 0 0 0 0  Worry too much - different things 0 0 0 0  Trouble relaxing 0 0 0 1  Restless 0 0 0 0  Easily annoyed or irritable 0 0 0   Afraid - awful might happen 0 0 0 0  Total GAD 7 Score 0 0 0   Anxiety Difficulty Not difficult at all Not difficult at all  Not difficult at all     No results found for any visits on 03/06/24.    Assessment & Plan:   Well adult exam -     CBC with Differential/Platelet; Future -     Comprehensive metabolic panel with GFR; Future -     Hemoglobin A1c; Future -     Lipid panel; Future -     T4, free; Future -     TSH; Future -  Vitamin B12; Future -     VITAMIN D  25 Hydroxy (Vit-D Deficiency, Fractures); Future -     Folate; Future  Nausea -     Comprehensive metabolic panel with GFR; Future -     Ambulatory referral to Gastroenterology  Decrease in appetite -     Comprehensive metabolic panel with GFR; Future -     T4, free; Future -     TSH; Future -     Ambulatory referral to Gastroenterology  Pruritus -     CBC with Differential/Platelet; Future -     Comprehensive metabolic panel with GFR; Future -     Vitamin B12; Future -     VITAMIN D  25 Hydroxy (Vit-D Deficiency, Fractures); Future -     Folate; Future -     Triamcinolone  Acetonide; Apply topically 2 (two) times daily.  Dispense: 30 g; Refill: 4  Paresthesia -     CBC with Differential/Platelet; Future -      Comprehensive metabolic panel with GFR; Future -     T4, free; Future -     TSH; Future -     Vitamin B12; Future -     VITAMIN D  25 Hydroxy (Vit-D Deficiency, Fractures); Future -     Folate; Future  Dry skin -     Triamcinolone  Acetonide; Apply topically 2 (two) times daily.  Dispense: 30 g; Refill: 4  Age-appropriate health screenings discussed.  Obtain labs.  Immunizations reviewed.  Consider PNA vaccine. Mammogram done 07/13/2023.  Pap not indicated.  Colonoscopy done 07/06/2023--multiple diverticula in the sigmoid colon with mucosal hypertrophy and significantly restricted mobility in the area of diverticulosis.  Fair visualization of colon due to moderate amount of stool.  Internal hemorrhoids noted.  EGD done on 07/21/22, gastritis and duodenitis noted with negative H.pylori.  Referral to GI for second opinion placed.  Patient to keep food diary.  Triamcinolone  refilled.  Obtain labs to evaluate paresthesias.  Advised to use gloves and avoid harsh chemicals on skin which can make pruritus and fingertips worse.  Since last OFV patient states she was seen by cardiology.  No change in cholesterol medication recommended at this time.  Return if symptoms worsen or fail to improve.   Clotilda JONELLE Single, MD

## 2024-03-07 ENCOUNTER — Telehealth: Payer: Self-pay | Admitting: Family Medicine

## 2024-03-07 LAB — CBC WITH DIFFERENTIAL/PLATELET
Absolute Lymphocytes: 2437 {cells}/uL (ref 850–3900)
Absolute Monocytes: 501 {cells}/uL (ref 200–950)
Basophils Absolute: 39 {cells}/uL (ref 0–200)
Basophils Relative: 0.7 %
Eosinophils Absolute: 83 {cells}/uL (ref 15–500)
Eosinophils Relative: 1.5 %
HCT: 43.1 % (ref 35.0–45.0)
Hemoglobin: 14.3 g/dL (ref 11.7–15.5)
MCH: 31.7 pg (ref 27.0–33.0)
MCHC: 33.2 g/dL (ref 32.0–36.0)
MCV: 95.6 fL (ref 80.0–100.0)
MPV: 9 fL (ref 7.5–12.5)
Monocytes Relative: 9.1 %
Neutro Abs: 2442 {cells}/uL (ref 1500–7800)
Neutrophils Relative %: 44.4 %
Platelets: 239 Thousand/uL (ref 140–400)
RBC: 4.51 Million/uL (ref 3.80–5.10)
RDW: 12.8 % (ref 11.0–15.0)
Total Lymphocyte: 44.3 %
WBC: 5.5 Thousand/uL (ref 3.8–10.8)

## 2024-03-07 LAB — VITAMIN B12: Vitamin B-12: 427 pg/mL (ref 200–1100)

## 2024-03-07 LAB — HEMOGLOBIN A1C
Hgb A1c MFr Bld: 6.4 % — ABNORMAL HIGH (ref <5.7)
Mean Plasma Glucose: 137 mg/dL
eAG (mmol/L): 7.6 mmol/L

## 2024-03-07 LAB — COMPREHENSIVE METABOLIC PANEL WITH GFR
AG Ratio: 1.3 (calc) (ref 1.0–2.5)
ALT: 10 U/L (ref 6–29)
AST: 13 U/L (ref 10–35)
Albumin: 4.3 g/dL (ref 3.6–5.1)
Alkaline phosphatase (APISO): 61 U/L (ref 37–153)
BUN/Creatinine Ratio: 12 (calc) (ref 6–22)
BUN: 13 mg/dL (ref 7–25)
CO2: 24 mmol/L (ref 20–32)
Calcium: 9.8 mg/dL (ref 8.6–10.4)
Chloride: 105 mmol/L (ref 98–110)
Creat: 1.11 mg/dL — ABNORMAL HIGH (ref 0.60–1.00)
Globulin: 3.2 g/dL (ref 1.9–3.7)
Glucose, Bld: 106 mg/dL — ABNORMAL HIGH (ref 65–99)
Potassium: 4.7 mmol/L (ref 3.5–5.3)
Sodium: 142 mmol/L (ref 135–146)
Total Bilirubin: 0.5 mg/dL (ref 0.2–1.2)
Total Protein: 7.5 g/dL (ref 6.1–8.1)
eGFR: 51 mL/min/1.73m2 — ABNORMAL LOW (ref >=60)

## 2024-03-07 LAB — LIPID PANEL
Cholesterol: 177 mg/dL (ref <200)
HDL: 66 mg/dL (ref >=50)
LDL Cholesterol (Calc): 92 mg/dL
Non-HDL Cholesterol (Calc): 111 mg/dL (ref <130)
Total CHOL/HDL Ratio: 2.7 (calc) (ref <5.0)
Triglycerides: 97 mg/dL (ref <150)

## 2024-03-07 LAB — TSH: TSH: 1.21 m[IU]/L (ref 0.40–4.50)

## 2024-03-07 LAB — VITAMIN D 25 HYDROXY (VIT D DEFICIENCY, FRACTURES): Vit D, 25-Hydroxy: 41 ng/mL (ref 30–100)

## 2024-03-07 LAB — T4, FREE: Free T4: 1.3 ng/dL (ref 0.8–1.8)

## 2024-03-07 NOTE — Telephone Encounter (Signed)
 Copied from CRM #[id number]. Topic: Referral - Status >> Mar 07, 2024 12:25 PM Suzen RAMAN wrote: Reason for CRM: Patient was contacted by [facility] and informed that all records from [facility] would need to be transferred to [facility] for review prior to provider to provide a second opinion. Patient also states that Margarete Plough recommended she stay with Davene Plough. Patient would like to know what Dr. Mercer recommends.    RA#[id number]

## 2024-03-08 ENCOUNTER — Ambulatory Visit

## 2024-03-08 DIAGNOSIS — Z0000 Encounter for general adult medical examination without abnormal findings: Secondary | ICD-10-CM | POA: Diagnosis not present

## 2024-03-08 NOTE — Patient Instructions (Signed)
 Ms. Cohill,  Thank you for taking the time for your Medicare Wellness Visit. I appreciate your continued commitment to your health goals. Please review the care plan we discussed, and feel free to reach out if I can assist you further.  Medicare recommends these wellness visits once per year to help you and your care team stay ahead of potential health issues. These visits are designed to focus on prevention, allowing your provider to concentrate on managing your acute and chronic conditions during your regular appointments.  Please note that Annual Wellness Visits do not include a physical exam. Some assessments may be limited, especially if the visit was conducted virtually. If needed, we may recommend a separate in-person follow-up with your provider.  Ongoing Care Seeing your primary care provider every 3 to 6 months helps us  monitor your health and provide consistent, personalized care.   Referrals If a referral was made during today's visit and you haven't received any updates within two weeks, please contact the referred provider directly to check on the status.  Recommended Screenings:  Health Maintenance  Topic Date Due   COVID-19 Vaccine (6 - 2025-26 season) 03/10/2024*   Eye exam for diabetics  07/28/2024   Complete foot exam   08/10/2024   Hemoglobin A1C  09/04/2024   Yearly kidney health urinalysis for diabetes  11/25/2024   Yearly kidney function blood test for diabetes  03/06/2025   Medicare Annual Wellness Visit  03/08/2025   DTaP/Tdap/Td vaccine (2 - Td or Tdap) 09/09/2032   Pneumococcal Vaccine for age over 35  Completed   Flu Shot  Completed   DEXA scan (bone density measurement)  Completed   Hepatitis C Screening  Completed   Zoster (Shingles) Vaccine  Completed   Meningitis B Vaccine  Aged Out   Breast Cancer Screening  Discontinued   Colon Cancer Screening  Discontinued  *Topic was postponed. The date shown is not the original due date.       03/08/2024    10:50 AM  Advanced Directives  Does Patient Have a Medical Advance Directive? Yes  Type of Estate agent of [place];Living will  Copy of Healthcare Power of Attorney in Chart? No - copy requested   Advance Care Planning is important because it: Ensures you receive medical care that aligns with your values, goals, and preferences. Provides guidance to your family and loved ones, reducing the emotional burden of decision-making during critical moments.  Vision: Annual vision screenings are recommended for early detection of glaucoma, cataracts, and diabetic retinopathy. These exams can also reveal signs of chronic conditions such as diabetes and high blood pressure.  Dental: Annual dental screenings help detect early signs of oral cancer, gum disease, and other conditions linked to overall health, including heart disease and diabetes.  Please see the attached documents for additional preventive care recommendations.

## 2024-03-08 NOTE — Progress Notes (Signed)
 Subjective:   Angela Pugh is a 78 y.o. who presents for a Medicare Wellness preventive visit.  As a reminder, Annual Wellness Visits don't include a physical exam, and some assessments may be limited, especially if this visit is performed virtually. We may recommend an in-person follow-up visit with your provider if needed.  Visit Complete: Virtual I connected with  Dwayne Prescott Christians on 03/08/24 by a audio enabled telemedicine application and verified that I am speaking with the correct person using two identifiers.  Patient Location: Home  Provider Location: Office/Clinic  I discussed the limitations of evaluation and management by telemedicine. The patient expressed understanding and agreed to proceed.  Vital Signs: Because this visit was a virtual/telehealth visit, some criteria may be missing or patient reported. Any vitals not documented were not able to be obtained and vitals that have been documented are patient reported.  VideoError- Librarian, academic were attempted between this provider and patient, however failed, due to patient having technical difficulties OR patient did not have access to video capability.  We continued and completed visit with audio only.   Persons Participating in Visit: Patient.  AWV Questionnaire: No: Patient Medicare AWV questionnaire was not completed prior to this visit.  Cardiac Risk Factors include: advanced age (>76men, >41 women);diabetes mellitus;dyslipidemia;hypertension     Objective:    Today's Vitals   There is no height or weight on file to calculate BMI.     03/08/2024   10:50 AM 06/20/2023    3:16 PM 04/01/2023   10:31 PM 02/19/2023    2:01 PM 12/08/2022    6:00 PM 12/08/2022    6:10 AM 12/01/2022    9:16 AM  Advanced Directives  Does Patient Have a Medical Advance Directive? Yes No No Yes Yes Yes Yes  Type of Estate agent of [place];Living will   Healthcare Power of  [place];Living will Healthcare Power of [employer] of [place];Living will Healthcare Power of [place];Living will  Does patient want to make changes to medical advance directive?     No - Patient declined    Copy of Healthcare Power of Attorney in Chart? No - copy requested   No - copy requested No - copy requested No - copy requested No - copy requested  Would patient like information on creating a medical advance directive?  No - Patient declined         Current Medications (verified) Outpatient Encounter Medications as of 03/08/2024  Medication Sig   ABRYSVO 120 MCG/0.5ML injection    amLODipine  (NORVASC ) 5 MG tablet TAKE 1 TABLET(5 MG) BY MOUTH DAILY   aspirin  81 MG EC tablet Take 81 mg by mouth daily. Swallow whole.   Aspirin -Acetaminophen -Caffeine (GOODY HEADACHE PO) Take 1 packet by mouth daily as needed (headaches).   Blood Glucose Monitoring Suppl DEVI 1 each by Does not apply route in the morning, at noon, and at bedtime. May substitute to any manufacturer covered by patient's insurance.   Calcium  Carb-Cholecalciferol  (CALCIUM  500 + D PO) Take 1 tablet by mouth daily.   clobetasol  ointment (TEMOVATE ) 0.05 % APPLY TOPICALLY TO THE AFFECTED AREA TWICE DAILY   famotidine  (PEPCID ) 20 MG tablet TAKE 1 TABLET(20 MG) BY MOUTH AT BEDTIME   ferrous sulfate  325 (65 FE) MG EC tablet Take 325 mg by mouth daily.   Glucose Blood (BLOOD GLUCOSE TEST STRIPS) STRP 1 each by In Vitro route in the morning, at noon, and at bedtime. May substitute to any  manufacturer covered by [employer].   guaiFENesin (MUCINEX) 600 MG 12 hr tablet Take by mouth 2 (two) times daily.   Lancets Misc. MISC 1 each by Does not apply route in the morning, at noon, and at bedtime. May substitute to any manufacturer covered by patient's insurance.   loratadine  (CLARITIN ) 10 MG tablet TAKE 1 TABLET(10 MG) BY MOUTH DAILY   meclizine  (ANTIVERT ) 25 MG tablet Take 1 tablet (25 mg total) by mouth 3 (three)  times daily as needed for dizziness.   mesalamine  (LIALDA ) 1.2 g EC tablet TAKE 4 TABLETS(4.8 GRAMS) BY MOUTH DAILY WITH BREAKFAST   morphine  (MSIR) 15 MG tablet Take 7.5 mg by mouth as needed.   Multiple Vitamins-Minerals (CENTRUM ADULTS PO) Take 1 tablet by mouth daily.    NON FORMULARY Diltiazem 2%/Lidocaine5% compound Use 3 x rectally daily for 2 months to heal anal fissure   olmesartan  (BENICAR ) 5 MG tablet Take 2 tablets (10 mg total) by mouth daily.   Omega-3 Fatty Acids (FISH OIL PO) Take 700 mg by mouth daily.   pantoprazole  (PROTONIX ) 40 MG tablet TAKE 1 TABLET(40 MG) BY MOUTH TWICE DAILY BEFORE A MEAL   potassium chloride  SA (KLOR-CON  M) 20 MEQ tablet TAKE 2 TABLETS(40 MEQ) BY MOUTH DAILY   PREDNISOLON-MOXIFLOX-BROMFENAC OP Place 1 drop into both eyes daily as needed (irritation).   rosuvastatin  (CRESTOR ) 40 MG tablet Take 1 tablet (40 mg total) by mouth daily.   sucralfate  (CARAFATE ) 1 g tablet TAKE 1 TABLET(1 GRAM) BY MOUTH FOUR TIMES DAILY- WITH EACH MEAL AND AT BEDTIME   triamcinolone  cream (KENALOG ) 0.5 % Apply topically 2 (two) times daily.   vitamin B-12 (CYANOCOBALAMIN ) 1000 MCG tablet Take 1,000 mcg by mouth daily.   cyclobenzaprine  (FLEXERIL ) 10 MG tablet Take 1 tablet (10 mg total) by mouth 2 (two) times daily as needed for muscle spasms. (Patient not taking: Reported on 03/08/2024)   dicyclomine  (BENTYL ) 10 MG capsule Take 1 capsule (10 mg total) by mouth 4 (four) times daily as needed for spasms (for abdominal pain). (Patient not taking: Reported on 03/08/2024)   Facility-Administered Encounter Medications as of 03/08/2024  Medication   diclofenac  Sodium (VOLTAREN ) 1 % topical gel 2 g    Allergies (verified) Symbicort  [budesonide -formoterol  fumarate], Acetaminophen , Azithromycin, Lipitor [atorvastatin], Macrolides and ketolides, Pregabalin , Statins, and Zyrtec [cetirizine]   History: Past Medical History:  Diagnosis Date   Arthritis    back, knees, shoulder, hands  , injections in pelvis, for bone spurs (05/04/2016)   Chronic back pain    Diverticulosis    Dysrhythmia    LBBB   GERD (gastroesophageal reflux disease)    History of kidney stones    HLD (hyperlipidemia)    Hypertension    Migraine    hx (05/04/2016)   Peripheral neuropathy    Radiculopathy    Sinus complaint    Sinus headache    occasional (05/04/2016)   Type II diabetes mellitus (HCC)    diet controlled, no meds now, was on insulin  & po treatment at one time     Vertigo    Past Surgical History:  Procedure Laterality Date   ANTERIOR CERVICAL DECOMP/DISCECTOMY FUSION N/A 04/17/2016   Procedure: ANTERIOR CERVICAL DECOMPRESSION/DISCECTOMY FUSION CERVICAL THREE - CERVICAL FOUR, POSTERIOR CERVICAL DISCECTOMY CERVICAL SEVEN -THORASIC ONE LEFT;  Surgeon: Rockey Peru, MD;  Location: [facility] OR;  Service: Neurosurgery;  Laterality: N/A;  Anterior/Posterior   ANTERIOR FUSION CERVICAL SPINE  2000   Dr. Gaither; for bone spurs; put hardware in  too   BACK SURGERY N/A 12/08/2022   COLONOSCOPY  06/10/2016   [facility]   COLONOSCOPY  2023   LASIK Bilateral    LEFT HEART CATH AND CORONARY ANGIOGRAPHY N/A 03/17/2018   Procedure: LEFT HEART CATH AND CORONARY ANGIOGRAPHY;  Surgeon: Court Dorn PARAS, MD;  Location: [facility] INVASIVE [facility];  Service: Cardiovascular;  Laterality: N/A;   LUMBAR LAMINECTOMY Left 06/30/2013   Procedure: Left L3-4 Extraforaminal approach to excise far lateral herniated nucleus pulposus;  Surgeon: Lynwood FORBES Better, MD;  Location: [facility] OR;  Service: Orthopedics;  Laterality: Left;   TUBAL LIGATION     VAGINAL HYSTERECTOMY     Family History  Problem Relation Age of Onset   Uterine cancer Mother    Diabetes type II Sister    Diabetes Mellitus II Brother    Diverticulitis Daughter    Heart Problems Daughter    SIDS Son    Colon cancer Neg Hx    Pancreatic cancer Neg Hx    Esophageal cancer Neg Hx    Colon polyps Neg Hx    Rectal cancer Neg Hx    Stomach cancer Neg Hx    Social  History   Socioeconomic History   Marital status: Widowed    Spouse name: Not on file   Number of children: 6   Years of education: Not on file   Highest education level: Not on file  Occupational History   Occupation: retired  Tobacco Use   Smoking status: Former    Current packs/day: 0.00    Average packs/day: 0.5 packs/day for 20.0 years (10.0 ttl pk-yrs)    Types: Cigarettes    Start date: 06/30/1962    Quit date: 06/30/1982    Years since quitting: 41.7   Smokeless tobacco: Never   Tobacco comments:    Patient only smokes in social setting when drinking. Admits former h/o heavy smoking years ago smoked 1.5 packs/day.    12/04/2021 Patient states she has not spoke for about 3 weeks    12/01/2022: pt will have a few drags if she is having a drink  Vaping Use   Vaping status: Never Used  Substance and Sexual Activity   Alcohol use: Not Currently    Comment: special occasion   Drug use: No   Sexual activity: Not Currently  Other Topics Concern   Not on file  Social History Narrative   Not on file   Social Drivers of Health   Financial Resource Strain: Low Risk  (03/08/2024)   Overall Financial Resource Strain (CARDIA)    Difficulty of Paying Living Expenses: Not hard at all  Food Insecurity: No Food Insecurity (03/08/2024)   Hunger Vital Sign    Worried About Running Out of Food in the Last Year: Never true    Ran Out of Food in the Last Year: Never true  Transportation Needs: No Transportation Needs (03/08/2024)   PRAPARE - Administrator, Civil Service (Medical): No    Lack of Transportation (Non-Medical): No  Physical Activity: Sufficiently Active (03/08/2024)   Exercise Vital Sign    Days of Exercise per Week: 7 days    Minutes of Exercise per Session: 60 min  Stress: No Stress Concern Present (03/08/2024)   [employer] of Occupational Health - Occupational Stress Questionnaire    Feeling of Stress: Not at all  Social Connections: Moderately  Isolated (03/08/2024)   Social Connection and Isolation Panel    Frequency of Communication with Friends and Family: More  than three times a week    Frequency of Social Gatherings with Friends and Family: More than three times a week    Attends Religious Services: More than 4 times per year    Active Member of Clubs or Organizations: No    Attends Banker Meetings: Never    Marital Status: Widowed    Tobacco Counseling Counseling given: Not Answered Tobacco comments: Patient only smokes in social setting when drinking. Admits former h/o heavy smoking years ago smoked 1.5 packs/day. 12/04/2021 Patient states she has not spoke for about 3 weeks 12/01/2022: pt will have a few drags if she is having a drink    Clinical Intake:  Pre-visit preparation completed: Yes  Pain : No/denies pain     Nutritional Risks: Nausea/ vomitting/ diarrhea (vomited last night, resolved) Diabetes: Yes CBG done?: No Did pt. bring in CBG monitor from home?: No  Lab Results  Component Value Date   HGBA1C 6.4 (H) 03/06/2024   HGBA1C 6.2 (A) 11/26/2023   HGBA1C 6.4 (H) 12/01/2022     How often do you need to have someone help you when you read instructions, pamphlets, or other written materials from your doctor or pharmacy?: 1 - Never  Interpreter Needed?: No  Information entered by :: NAllen LPN   Activities of Daily Living     03/08/2024   10:42 AM  In your present state of health, do you have any difficulty performing the following activities:  Hearing? 0  Vision? 1  Comment decreased in left eye  Difficulty concentrating or making decisions? 0  Walking or climbing stairs? 0  Dressing or bathing? 0  Doing errands, shopping? 0  Preparing Food and eating ? N  Using the Toilet? N  In the past six months, have you accidently leaked urine? N  Do you have problems with loss of bowel control? N  Managing your Medications? N  Managing your Finances? N  Housekeeping or managing  your Housekeeping? N    Patient Care Team: Mercer Clotilda SAUNDERS, MD as PCP - General (Family Medicine) Bensimhon, Toribio SAUNDERS, MD as PCP - Advanced Heart Failure (Cardiology) Court Dorn PARAS, MD as PCP - Cardiology (Cardiology) Lionell Jon DEL, [facility] (Pharmacist) Duke, Jon Garre, PA as Physician Assistant (Cardiology)  I have updated your Care Teams any recent Medical Services you may have received from other providers in the past year.     Assessment:   This is a routine wellness examination for Angela Pugh.  Hearing/Vision screen Hearing Screening - Comments:: Denies hearing issues Vision Screening - Comments:: Regular eye exams, [facility]   Goals Addressed             This Visit's Progress    Patient Stated       03/08/2024, remain mobile and remain independent       Depression Screen     03/08/2024   10:52 AM 03/06/2024    8:58 AM 02/23/2024    4:39 PM 11/26/2023    1:30 PM 07/30/2023    3:55 PM 02/19/2023    1:59 PM 02/12/2023    8:46 AM  PHQ 2/9 Scores  PHQ - 2 Score 0 0 0 1 0 0 0  PHQ- 9 Score 1 3 5 6 3  0 2    Fall Risk     03/08/2024   10:51 AM 02/23/2024    4:38 PM 11/26/2023    1:30 PM 08/02/2023    8:27 AM 07/30/2023    2:38 PM  Fall Risk   Falls in the past year? 0 0 0 0 0  Number falls in past yr: 0 0 0  0  Injury with Fall? 0 0 0  0  Risk for fall due to : Medication side effect No Fall Risks No Fall Risks    Follow up Falls evaluation completed;Falls prevention discussed Falls evaluation completed Falls evaluation completed  Falls evaluation completed    MEDICARE RISK AT HOME:  Medicare Risk at Home Any stairs in or around the home?: No If so, are there any without handrails?: No Home free of loose throw rugs in walkways, pet beds, electrical cords, etc?: Yes Adequate lighting in your home to reduce risk of falls?: Yes Life alert?: No Use of a cane, walker or w/c?: No Grab bars in the bathroom?: No Shower chair or bench in shower?:  Yes Elevated toilet seat or a handicapped toilet?: No  TIMED UP AND GO:  Was the test performed?  No  Cognitive Function: 6CIT completed        03/08/2024   10:55 AM 02/19/2023    2:01 PM 02/10/2022    8:23 AM 01/28/2021   11:20 AM 02/21/2020    8:33 AM  6CIT Screen  What Year? 0 points 0 points 0 points 0 points 0 points  What month? 0 points 0 points 0 points 0 points 0 points  What time? 0 points 0 points 0 points 0 points 0 points  Count back from 20 0 points 0 points 0 points 0 points 0 points  Months in reverse 0 points 0 points 0 points 0 points 0 points  Repeat phrase 0 points 0 points 0 points 2 points 0 points  Total Score 0 points 0 points 0 points 2 points 0 points    Immunizations Immunization History  Administered Date(s) Administered    sv, Bivalent, Protein Subunit Rsvpref,pf [place]) 09/10/2022   Fluad Quad(high Dose 65+) 02/10/2022   Fluad Trivalent(High Dose 65+) 02/12/2023   INFLUENZA, HIGH DOSE SEASONAL PF 02/09/2024   Moderna Covid-19 Fall Seasonal Vaccine 49yrs & older 02/12/2022   PFIZER(Purple Top)SARS-COV-2 Vaccination 06/24/2019, 07/15/2019, 02/27/2020   PNEUMOCOCCAL CONJUGATE-20 09/10/2022   Pfizer(Comirnaty)Fall Seasonal Vaccine 12 years and older 05/04/2023   Tdap 09/10/2022   Zoster Recombinant(Shingrix) 02/12/2022, 09/10/2022    Screening Tests Health Maintenance  Topic Date Due   COVID-19 Vaccine (6 - 2025-26 season) 03/10/2024 (Originally 01/17/2024)   OPHTHALMOLOGY EXAM  07/28/2024   FOOT EXAM  08/10/2024   HEMOGLOBIN A1C  09/04/2024   Diabetic kidney evaluation - Urine ACR  11/25/2024   Diabetic kidney evaluation - eGFR measurement  03/06/2025   Medicare Annual Wellness (AWV)  03/08/2025   DTaP/Tdap/Td (2 - Td or Tdap) 09/09/2032   Pneumococcal Vaccine: 50+ Years  Completed   Influenza Vaccine  Completed   DEXA SCAN  Completed   Hepatitis C Screening  Completed   Zoster Vaccines- Shingrix  Completed   Meningococcal B Vaccine   Aged Out   Mammogram  Discontinued   Colonoscopy  Discontinued    Health Maintenance Items Addressed: Up to date  Additional Screening:  Vision Screening: Recommended annual ophthalmology exams for early detection of glaucoma and other disorders of the eye. Is the patient up to date with their annual eye exam?  Yes  Who is the provider or what is the name of the office in which the patient attends annual eye exams? Dr. Juvenal  Dental Screening: Recommended annual dental exams for proper oral hygiene  Community Resource Referral / Chronic Care Management: CRR required this visit?  No   CCM required this visit?  No   Plan:    I have personally reviewed and noted the following in the patient's chart:   Medical and social history Use of alcohol, tobacco or illicit drugs  Current medications and supplements including opioid prescriptions. Patient is not currently taking opioid prescriptions. Functional ability and status Nutritional status Physical activity Advanced directives List of other physicians Hospitalizations, surgeries, and ER visits in previous 12 months Vitals Screenings to include cognitive, depression, and falls Referrals and appointments  In addition, I have reviewed and discussed with patient certain preventive protocols, quality metrics, and best practice recommendations. A written personalized care plan for preventive services as well as general preventive health recommendations were provided to patient.   Ardella FORBES Dawn, LPN   [id number]   After Visit Summary: (Pick Up) Due to this being a telephonic visit, with patients personalized plan was offered to patient and patient has requested to Pick up at office.  Notes: Nothing significant to report at this time.

## 2024-03-12 ENCOUNTER — Ambulatory Visit: Payer: Self-pay | Admitting: Family Medicine

## 2024-03-13 NOTE — Telephone Encounter (Signed)
 Referral for second opinion placed as pt reports continued GI issues.  She can try seeing a different provider in her current GI office.

## 2024-03-14 DIAGNOSIS — D1801 Hemangioma of skin and subcutaneous tissue: Secondary | ICD-10-CM | POA: Diagnosis not present

## 2024-03-14 DIAGNOSIS — L814 Other melanin hyperpigmentation: Secondary | ICD-10-CM | POA: Diagnosis not present

## 2024-03-14 DIAGNOSIS — L821 Other seborrheic keratosis: Secondary | ICD-10-CM | POA: Diagnosis not present

## 2024-03-14 DIAGNOSIS — L301 Dyshidrosis [pompholyx]: Secondary | ICD-10-CM | POA: Diagnosis not present

## 2024-04-23 ENCOUNTER — Other Ambulatory Visit: Payer: Self-pay | Admitting: Nurse Practitioner

## 2024-04-24 ENCOUNTER — Ambulatory Visit: Admitting: Orthopedic Surgery

## 2024-04-24 ENCOUNTER — Other Ambulatory Visit: Payer: Self-pay

## 2024-04-24 DIAGNOSIS — M1712 Unilateral primary osteoarthritis, left knee: Secondary | ICD-10-CM

## 2024-04-24 NOTE — Progress Notes (Signed)
 Orthopedic Office Note  Patient comes in today with left knee pain.  Is gotten worse since she was last in the office.  She notes that with weightbearing.  She feels it over the medial and lateral joint lines.  It gets better if she rests.  She said the last injection lasted for 2 months but now the pain is severe.  I discussed repeat injection.  I also told her that it sounds like the injections are not effective at this point.  She is interested in more definitive management.  I will refer her to one of my partners to discuss possible surgical options for her.  Left knee intra-articular injection was done today in the office.   Left knee intra-articular injection note: After discussing the risk, benefits, alternatives of left knee intra-articular injection, patient elected proceed.  Patient was in the seated position with the knee at 90 degrees.  The anterolateral soft spot over the knee was prepped with alcohol-based prep.  Ethyl chloride was used to anesthetize the skin.  A 20-gauge needle he was used to inject 1 cc of lidocaine , 1 cc bupivacaine , 1 cc of Depo-Medrol  into the intra-articular space space under standard sterile technique.  Needle was withdrawn and Band-Aid was applied.  Patient tolerated the procedure well.   Angela DELENA Ada, MD Orthopedic Surgeon

## 2024-04-26 ENCOUNTER — Encounter: Payer: Self-pay | Admitting: Podiatry

## 2024-04-26 ENCOUNTER — Ambulatory Visit: Admitting: Podiatry

## 2024-04-26 DIAGNOSIS — B351 Tinea unguium: Secondary | ICD-10-CM

## 2024-04-26 DIAGNOSIS — M79675 Pain in left toe(s): Secondary | ICD-10-CM

## 2024-04-26 DIAGNOSIS — E1151 Type 2 diabetes mellitus with diabetic peripheral angiopathy without gangrene: Secondary | ICD-10-CM

## 2024-04-26 DIAGNOSIS — M79674 Pain in right toe(s): Secondary | ICD-10-CM | POA: Diagnosis not present

## 2024-04-26 DIAGNOSIS — Q828 Other specified congenital malformations of skin: Secondary | ICD-10-CM | POA: Diagnosis not present

## 2024-05-02 NOTE — Progress Notes (Signed)
 Subjective:  Patient ID: Angela Pugh, female    DOB: [birth year],  MRN: [id number]  Angela Pugh presents to clinic today for at risk foot care. Pt has h/o NIDDM with PAD and painful porokeratotic lesion(s) of both feet and painful mycotic toenails that limit ambulation. Painful toenails interfere with ambulation. Aggravating factors include wearing enclosed shoe gear. Pain is relieved with periodic professional debridement. Painful porokeratotic lesions are aggravated when weightbearing with and without shoegear. Pain is relieved with periodic professional debridement.  Chief Complaint  Patient presents with   Angela Pugh    Rm16 Diabetic foot care/ Dr. Clotilda Single last visit November 2025   New problem(s): None.   PCP is Single Clotilda SAUNDERS, MD.  Allergies[1]  Review of Systems: Negative except as noted in the HPI.  Objective: No changes noted in today's physical examination. There were no vitals filed for this visit. Angela Pugh is a pleasant 78 y.o. female WD, WN in NAD. AAO x 3.  Vascular Examination: CFT <3 seconds b/l. DP pulse faintly palpable right foot. DP pulse nonpalpable left foot b/l. PT pulses faintly palpable b/l. Digital hair absent. Skin temperature gradient warm to warm b/l. No pain with calf compression. No ischemia or gangrene. No cyanosis or clubbing noted b/l. No edema noted b/l LE.   Neurological Examination: Sensation grossly intact b/l with 10 gram monofilament. Vibratory sensation intact b/l. Pt has subjective symptoms of neuropathy.  Dermatological Examination: Pedal skin warm and supple b/l.   No open wounds. No interdigital macerations.  Toenails 1-5 b/l thick, discolored, elongated with subungual debris and pain on dorsal palpation.    Porokeratotic lesion(s) plantar heel pad of right foot, posterolateral aspect of right heel, plantar IPJ of left great toe, and submet head 2 left foot. No erythema, no edema, no drainage, no  fluctuance.  Musculoskeletal Examination: Muscle strength 5/5 to all lower extremity muscle groups bilaterally. Hammertoe(s) bilateral 2nd toes.. No pain, crepitus or joint limitation noted with ROM b/l LE.  Patient ambulates independently without assistive aids.  Radiographs: None  Assessment/Plan: 1. Pain due to onychomycosis of toenails of both feet   2. Porokeratosis   3. Type II diabetes mellitus with peripheral circulatory disorder [facility] Pugh)   Consent given for treatment. Patient examined. All patient's and/or POA's questions/concerns addressed on today's visit. Mycotic toenails 1-5 b/l debrided in length and girth without incident. Porokeratotic lesion(s) x 2 plantarlateral heel right foot, posterolateral aspect of right heel, plantar IPJ of left great toe, and submet head 2 left foot pared and enucleated with sharp debridement without incident.Continue daily foot inspections and monitor blood glucose per PCP/Endocrinologist's recommendations. Continue soft, supportive shoe gear daily. Report any pedal injuries to medical professional. Call office if there are any quesitons/concerns.  Return in about 9 weeks (around 06/28/2024).  Delon LITTIE Merlin, DPM      [place] LOCATION: [address], [place] [address]                   Office [phone]   [facility] LOCATION: [address] [place], [place] [address] Office [phone]     [1]  Allergies Allergen Reactions   Symbicort  [Budesonide -Formoterol  Fumarate] Shortness Of Breath, Swelling and  Other (See Comments)    Pharyngeal edema, hoarseness, tightness in chest, lips became dark.   Acetaminophen  Hives and Nausea Only   Azithromycin Hives and Nausea And Vomiting   Lipitor [Atorvastatin] Nausea Only    weakness   Macrolides And Ketolides Hives, Nausea And Vomiting and Other (See Comments)   Pregabalin  Nausea And Vomiting    dizziness   Statins Nausea Only and  Other (See Comments)    weakness   Zyrtec [Cetirizine] Hives

## 2024-05-03 ENCOUNTER — Telehealth: Payer: Self-pay | Admitting: Cardiovascular Disease

## 2024-05-03 ENCOUNTER — Telehealth (HOSPITAL_BASED_OUTPATIENT_CLINIC_OR_DEPARTMENT_OTHER): Payer: Self-pay

## 2024-05-03 ENCOUNTER — Telehealth: Payer: Self-pay | Admitting: Family Medicine

## 2024-05-03 ENCOUNTER — Ambulatory Visit: Admitting: Orthopaedic Surgery

## 2024-05-03 DIAGNOSIS — M1712 Unilateral primary osteoarthritis, left knee: Secondary | ICD-10-CM | POA: Insufficient documentation

## 2024-05-03 NOTE — Telephone Encounter (Signed)
 Pt dropped off form to be signed by Dr Mercer. Form in folder

## 2024-05-03 NOTE — Telephone Encounter (Signed)
° °  Pre-operative Risk Assessment    Patient Name: Angela Pugh  DOB: [birth year] MRN: [id number]   Date of last office visit: 02/29/24 with Court  Date of next office visit: NA  Request for Surgical Clearance    Procedure:  Left total knee arthroplasty   Date of Surgery:  Clearance TBD                                 Surgeon:  Dr. Kay Sharper XU Surgeon's Group or Practice Name:  [facility]  Phone number:  [phone] Fax number:  [phone]   Type of Clearance Requested:   - Medical  - Pharmacy:  Hold Aspirin  not indicated   Type of Anesthesia:  Not Indicated   Additional requests/questions:    [name] [name] [name]   05/03/2024, 5:00 PM

## 2024-05-03 NOTE — Telephone Encounter (Signed)
 Faxed a pre-op form for this patient today.  Thank you.

## 2024-05-03 NOTE — Progress Notes (Signed)
 Office Visit Note   Patient: Angela Pugh           Date of Birth: [birth year]           MRN: [id number] Visit Date: 05/03/2024              Requested by: Georgina Ozell LABOR, MD [address] [place],  [place] [address] PCP: Mercer Clotilda SAUNDERS, MD   Assessment & Plan: Visit Diagnoses:  1. Primary osteoarthritis of left knee     Plan: History of Present Illness Angela Pugh is a 78 year old female with chronic knee pain who presents for evaluation of her left knee. She was referred by Dr. Georgina for surgical evaluation for left total knee arthroplasty.  She reports chronic left knee pain that has worsened over time, radiating up the leg into the hip. She notes episodes of the knee giving out, including a recent fall in her yard without injury.  She has received multiple cortisone injections, including one about a week and a half ago and another in October, with minimal relief.  She takes morphine  for chronic pain related to her neck, back, and knee.  She has an irregular heartbeat managed by Dr. Dorn Lesches. She is not on blood thinners. She has no nickel allergy.  Physical Exam MUSCULOSKELETAL: Crepitus behind left patella.  Pain with knee ROM.  Collaterals are stable.  Functional range of motion  Results Radiology Left knee X-ray: XRs of the left knee from 04/24/2024 were independently reviewed and interpreted, showing joint space narrowing in the medial and lateral compartments.  Subchondral sclerosis seen in the lateral compartment.  Osteophyte formation seen in the lateral and patellofemoral compartments.  No fracture or dislocation seen.   Kellgren-Lawrence stage IV  Assessment and Plan Primary osteoarthritis of left knee Chronic osteoarthritis with significant pain and functional impairment.  - Plan left total knee arthroplasty after 90 days from last steroid injection on December 8th, 2024, to minimize infection risk. - Obtain surgical clearance from Dr. Mercer and  Dr. Lesches. - Provided handout on knee replacement surgery. - Arrange post-operative physical therapy at home and outpatient.  Impression is severe left knee degenerative joint disease secondary to Osteoarthritis.  Patient has attempted conservative treatment for at least 6 consecutive weeks within the past 12 weeks, including but not limited to physical therapy, home exercise program, NSAIDs, activity modification, and/or corticosteroid injections. Despite these efforts, symptoms have not improved or have worsened. Conservative measures have been deemed unsuccessful at this time. After a detailed discussion covering diagnosis and treatment options--including the risks, benefits, alternatives, and potential complications of surgical and nonsurgical management--the patient elected to proceed with surgery  Anticoagulants: No antithrombotic Postop anticoagulation: Eliquis Diabetic: No  Nickel allergy: No Prior DVT/PE: No Tobacco use: No Clearances needed for surgery: PCP, cardiology Anticipated discharge dispo: Home with daughter   Follow-Up Instructions: No follow-ups on file.   Orders:  No orders of the defined types were placed in this encounter.  No orders of the defined types were placed in this encounter.     Procedures: No procedures performed   Clinical Data: No additional findings.   Subjective: Chief Complaint  Patient presents with   Left Knee - Pain    HPI  Review of Systems  Constitutional: Negative.   HENT: Negative.    Eyes: Negative.   Respiratory: Negative.    Cardiovascular: Negative.   Endocrine: Negative.   Musculoskeletal: Negative.   Neurological: Negative.   Hematological: Negative.  Psychiatric/Behavioral: Negative.    All other systems reviewed and are negative.    Objective: Vital Signs: There were no vitals taken for this visit.  Physical Exam Vitals and nursing note reviewed.  Constitutional:      Appearance: She is  well-developed.  HENT:     Head: Atraumatic.     Nose: Nose normal.  Eyes:     Extraocular Movements: Extraocular movements intact.  Cardiovascular:     Pulses: Normal pulses.  Pulmonary:     Effort: Pulmonary effort is normal.  Abdominal:     Palpations: Abdomen is soft.  Musculoskeletal:     Cervical back: Neck supple.  Skin:    General: Skin is warm.     Capillary Refill: Capillary refill takes less than 2 seconds.  Neurological:     Mental Status: She is alert. Mental status is at baseline.  Psychiatric:        Behavior: Behavior normal.        Thought Content: Thought content normal.        Judgment: Judgment normal.     Ortho Exam  Specialty Comments:  MRI LUMBAR SPINE WITHOUT CONTRAST   TECHNIQUE: Multiplanar, multisequence MR imaging of the lumbar spine was performed. No intravenous contrast was administered.   COMPARISON:  CT myelogram September 13, 2019.   FINDINGS: Segmentation:  Standard.   Alignment: Levoconvex scoliosis. Grade 1 anterolisthesis of L4 over L5 related to facet arthropathy.   Vertebrae:  No fracture, evidence of discitis, or bone lesion.   Conus medullaris and cauda equina: Conus extends to the L2 level. Conus and cauda equina appear normal.   Paraspinal and other soft tissues: Negative.   Disc levels:   T11-12: Only evaluated on sagittal views. Loss of disc height, disc bulge resulting in mild left neural foraminal narrowing. No significant spinal canal stenosis.   T12-L1: No spinal canal or neural foraminal stenosis.   L1-2: Shallow disc bulge and mild facet degenerative changes without significant spinal canal or neural foraminal stenosis.   L2-3: Shallow disc bulge and mild facet degenerative changes without significant spinal canal or neural foraminal stenosis.   L3-4: Mild loss of disc height, disc bulge, moderate facet degenerative changes and ligamentum flavum redundancy resulting in mild spinal canal stenosis with mild  narrowing of the bilateral subarticular zones, moderate bilateral neural foraminal narrowing.   L4-5: Disc bulge/disc uncovering, prominent hypertrophic facet degenerative change with mild joint effusion and ligamentum flavum redundancy resulting in moderate spinal canal stenosis with severe narrowing of the bilateral subarticular zones, moderate right and mild left neural foraminal narrowing.   L5-S1: Loss of disc height, disc bulge with associated osteophytic component and superimposed small central disc protrusion, moderate facet degenerative changes. Findings result in mild bilateral neural foraminal narrowing. No significant spinal canal stenosis.   IMPRESSION: 1. Degenerative changes of the lumbar spine, more pronounced at L4-5 where there is anterolisthesis related to severe facet arthropathy resulting in moderate spinal canal stenosis with narrowing of the bilateral subarticular zone and moderate right neural foraminal narrowing. Findings likely result in impingement of the bilateral L5 and right L4 nerve roots. 2. Moderate bilateral neural foraminal narrowing at L3-4.     Electronically Signed   By: Katyucia  de Macedo Rodrigues M.D.   On: 06/06/2021 09:50  Imaging: No results found.   PMFS History: Patient Active Problem List   Diagnosis Date Noted   Primary osteoarthritis of left knee 05/03/2024   Diverticulosis 04/01/2023   Constipation 02/18/2023  Radiculopathy, lumbar region 12/08/2022   Spondylolisthesis, lumbar region 12/08/2022   Headache above the eye region 02/19/2021   Nasal congestion 02/19/2021   Emphysema lung (HCC) 10/04/2020   Abdominal pain, epigastric 03/18/2020   Body mass index (BMI) 26.0-26.9, adult 10/23/2019   Nonischemic cardiomyopathy (HCC) 04/12/2018   Chest pain 01/25/2018   Left bundle branch block 01/25/2018   Carotid artery disease 01/25/2018   Seasonal allergies 10/18/2017   Arthritis 10/18/2017   Pre-diabetes 10/18/2017    Protein-calorie malnutrition, severe 05/05/2016   Dysphagia 05/04/2016   Displacement of cervical intervertebral disc without myelopathy 03/18/2016   Carpal tunnel syndrome 02/10/2016   Cervical spondylosis with radiculopathy 02/10/2016   Abdominal pain 02/01/2016   Acute diverticulitis 02/01/2016   HLD (hyperlipidemia) 02/01/2016   Diabetes mellitus without complication (HCC) 02/01/2016   Hypokalemia 02/01/2016   Hypertension    Cervical spondylosis without myelopathy 01/13/2016   Bilateral carpal tunnel syndrome 01/13/2016   Herniated lumbar intervertebral disc 06/30/2013    Class: Acute   Low back pain 02/06/2013   Past Medical History:  Diagnosis Date   Arthritis    back, knees, shoulder, hands , injections in pelvis, for bone spurs (05/04/2016)   Chronic back pain    Diverticulosis    Dysrhythmia    LBBB   GERD (gastroesophageal reflux disease)    History of kidney stones    HLD (hyperlipidemia)    Hypertension    Migraine    hx (05/04/2016)   Peripheral neuropathy    Radiculopathy    Sinus complaint    Sinus headache    occasional (05/04/2016)   Type II diabetes mellitus (HCC)    diet controlled, no meds now, was on insulin  & po treatment at one time     Vertigo     Family History  Problem Relation Age of Onset   Uterine cancer Mother    Diabetes type II Sister    Diabetes Mellitus II Brother    Diverticulitis Daughter    Heart Problems Daughter    SIDS Son    Colon cancer Neg Hx    Pancreatic cancer Neg Hx    Esophageal cancer Neg Hx    Colon polyps Neg Hx    Rectal cancer Neg Hx    Stomach cancer Neg Hx     Past Surgical History:  Procedure Laterality Date   ANTERIOR CERVICAL DECOMP/DISCECTOMY FUSION N/A 04/17/2016   Procedure: ANTERIOR CERVICAL DECOMPRESSION/DISCECTOMY FUSION CERVICAL THREE - CERVICAL FOUR, POSTERIOR CERVICAL DISCECTOMY CERVICAL SEVEN -THORASIC ONE LEFT;  Surgeon: Rockey Peru, MD;  Location: [facility] OR;  Service: Neurosurgery;   Laterality: N/A;  Anterior/Posterior   ANTERIOR FUSION CERVICAL SPINE  2000   Dr. Gaither; for bone spurs; put hardware in too   BACK SURGERY N/A 12/08/2022   COLONOSCOPY  06/10/2016   [facility]   COLONOSCOPY  2023   LASIK Bilateral    LEFT HEART CATH AND CORONARY ANGIOGRAPHY N/A 03/17/2018   Procedure: LEFT HEART CATH AND CORONARY ANGIOGRAPHY;  Surgeon: Court Dorn PARAS, MD;  Location: [facility] INVASIVE CV LAB;  Service: Cardiovascular;  Laterality: N/A;   LUMBAR LAMINECTOMY Left 06/30/2013   Procedure: Left L3-4 Extraforaminal approach to excise far lateral herniated nucleus pulposus;  Surgeon: Lynwood FORBES Better, MD;  Location[facility];  Service: Orthopedics;  Laterality: Left;   TUBAL LIGATION     VAGINAL HYSTERECTOMY     Social History   Occupational History   Occupation: retired  Tobacco Use   Smoking status: Former  Current packs/day: 0.00    Average packs/day: 0.5 packs/day for 20.0 years (10.0 ttl pk-yrs)    Types: Cigarettes    Start date: 06/30/1962    Quit date: 06/30/1982    Years since quitting: 41.8   Smokeless tobacco: Never   Tobacco comments:    Patient only smokes in social setting when drinking. Admits former h/o heavy smoking years ago smoked 1.5 packs/day.    12/04/2021 Patient states she has not spoke for about 3 weeks    12/01/2022: pt will have a few drags if she is having a drink  Vaping Use   Vaping status: Never Used  Substance and Sexual Activity   Alcohol use: Not Currently    Comment: special occasion   Drug use: No   Sexual activity: Not Currently

## 2024-05-04 NOTE — Telephone Encounter (Signed)
° °  Name: Angela Pugh  DOB: 21-Aug-1945  MRN: [id number]  Primary Cardiologist: Dorn Lesches, MD   Preoperative team, please contact this patient and set up a phone call appointment for further preoperative risk assessment. Please obtain consent and complete medication review. Thank you for your help.  I confirm that guidance regarding antiplatelet and oral anticoagulation therapy has been completed and, if necessary, noted below.  Ideally aspirin  should be continued without interruption, however if the bleeding risk is too great, aspirin  may be held for 5-7 days prior to surgery. Please resume aspirin  post operatively when it is felt to be safe from a bleeding standpoint.    I also confirmed the patient resides in the state of [place] . As per [facility] Medical Board telemedicine laws, the patient must reside in the state in which the provider is licensed.    Barnie Hila, NP 05/04/2024, 3:13 PM [facility]

## 2024-05-04 NOTE — Telephone Encounter (Signed)
 Called and left the patient a detailed message to schedule pre-op clearance. 1st attempt.

## 2024-05-05 ENCOUNTER — Telehealth: Payer: Self-pay

## 2024-05-05 NOTE — Telephone Encounter (Signed)
 Preop tele appt now scheduled, med rec and consent done. Tele appt scheduled in February as pt states her procedure estimated date will be in March since she just had a knee injection and informed by the provider that she will have to wait 3 months post injection before the procedure can be done.

## 2024-05-05 NOTE — Telephone Encounter (Signed)
"  °  Patient Consent for Virtual Visit        [name] has provided verbal consent on 05/05/2024 for a virtual visit (video or telephone).   CONSENT FOR VIRTUAL VISIT FOR:  Angela Pugh  By participating in this virtual visit I agree to the following:  I hereby voluntarily request, consent and authorize [facility] and its employed or contracted physicians, physician assistants, nurse practitioners or other licensed health care professionals (the Practitioner), to provide me with telemedicine health care services (the Services) as deemed necessary by the treating Practitioner. I acknowledge and consent to receive the Services by the Practitioner via telemedicine. I understand that the telemedicine visit will involve communicating with the Practitioner through live audiovisual communication technology and the disclosure of certain medical information by electronic transmission. I acknowledge that I have been given the opportunity to request an in-person assessment or other available alternative prior to the telemedicine visit and am voluntarily participating in the telemedicine visit.  I understand that I have the right to withhold or withdraw my consent to the use of telemedicine in the course of my care at any time, without affecting my right to future care or treatment, and that the Practitioner or I may terminate the telemedicine visit at any time. I understand that I have the right to inspect all information obtained and/or recorded in the course of the telemedicine visit and may receive copies of available information for a reasonable fee.  I understand that some of the potential risks of receiving the Services via telemedicine include:  Delay or interruption in medical evaluation due to technological equipment failure or disruption; Information transmitted may not be sufficient (e.g. poor resolution of images) to allow for appropriate medical decision making by the  Practitioner; and/or  In rare instances, security protocols could fail, causing a breach of personal health information.  Furthermore, I acknowledge that it is my responsibility to provide information about my medical history, conditions and care that is complete and accurate to the best of my ability. I acknowledge that Practitioner's advice, recommendations, and/or decision may be based on factors not within their control, such as incomplete or inaccurate data provided by me or distortions of diagnostic images or specimens that may result from electronic transmissions. I understand that the practice of medicine is not an exact science and that Practitioner makes no warranties or guarantees regarding treatment outcomes. I acknowledge that a copy of this consent can be made available to me via my patient portal [facility] MyChart), or I can request a printed copy by calling the office of [facility].    I understand that my insurance will be billed for this visit.   I have read or had this consent read to me. I understand the contents of this consent, which adequately explains the benefits and risks of the Services being provided via telemedicine.  I have been provided ample opportunity to ask questions regarding this consent and the Services and have had my questions answered to my satisfaction. I give my informed consent for the services to be provided through the use of telemedicine in my medical care    "

## 2024-05-22 ENCOUNTER — Ambulatory Visit (INDEPENDENT_AMBULATORY_CARE_PROVIDER_SITE_OTHER): Admitting: Orthopedic Surgery

## 2024-05-22 DIAGNOSIS — G8929 Other chronic pain: Secondary | ICD-10-CM | POA: Diagnosis not present

## 2024-05-22 DIAGNOSIS — M25562 Pain in left knee: Secondary | ICD-10-CM

## 2024-05-22 MED ORDER — MORPHINE SULFATE 15 MG PO TABS: 7.5000 mg | ORAL_TABLET | Freq: Four times a day (QID) | ORAL | 0 refills | Status: AC | PRN

## 2024-05-22 NOTE — Progress Notes (Signed)
 Orthopedic Office Note  Patient's still having left knee pain.  Her pain is no longer responding to steroid injections, over-the-counter medications, home exercise program.  She saw my partner Dr. Jerri who recommended left knee replacement.  He wants her to wait about 90 days prior to surgery after that knee injection.  Her pain has gotten bad lately.  She is ambulating without assistive devices.  On exam, she is in no acute distress.  She has no pain with passive range of motion from 0-70.  She starts to have pain as I flex the knee past 70 degrees.  There is palpable crepitus through range of motion.  Knee is stable to varus and valgus stress.  Negative Lachman.  Negative posterior drawer.  No effusion.  EHL/TA/GSC intact.  Foot warm and well perfused. Sensation intact to light touch in sural/saphenous/deep/superficial peroneal/tibial nerve distributions.  She has previously used morphine  to help with her pain.  She uses a 7.5 mg to 50 mg dosage.  Her last refill was in July with 20 tablets.  A new prescription was provided to her today.  She will next see Dr. Jerri had surgery for her knee replacement.  She can follow-up with me as needed.   Ozell DELENA Ada, MD Orthopedic Surgeon

## 2024-06-15 ENCOUNTER — Other Ambulatory Visit: Payer: Self-pay | Admitting: Family Medicine

## 2024-06-15 DIAGNOSIS — Z1231 Encounter for screening mammogram for malignant neoplasm of breast: Secondary | ICD-10-CM

## 2024-06-27 ENCOUNTER — Ambulatory Visit

## 2024-07-14 ENCOUNTER — Ambulatory Visit

## 2024-07-25 ENCOUNTER — Ambulatory Visit: Admitting: Podiatry

## 2024-10-25 ENCOUNTER — Ambulatory Visit: Admitting: Podiatry

## 2024-11-30 ENCOUNTER — Ambulatory Visit: Admitting: Orthopedic Surgery
